# Patient Record
Sex: Female | Born: 1940
Health system: Southern US, Community
[De-identification: ages and names within clinical notes are randomized; demographics above are authoritative.]

## PROBLEM LIST (undated history)

## (undated) DIAGNOSIS — H269 Unspecified cataract: Secondary | ICD-10-CM

## (undated) DIAGNOSIS — C801 Malignant (primary) neoplasm, unspecified: Secondary | ICD-10-CM

## (undated) DIAGNOSIS — R519 Headache, unspecified: Secondary | ICD-10-CM

## (undated) DIAGNOSIS — E119 Type 2 diabetes mellitus without complications: Secondary | ICD-10-CM

## (undated) DIAGNOSIS — M109 Gout, unspecified: Secondary | ICD-10-CM

## (undated) DIAGNOSIS — J358 Other chronic diseases of tonsils and adenoids: Secondary | ICD-10-CM

## (undated) DIAGNOSIS — K219 Gastro-esophageal reflux disease without esophagitis: Secondary | ICD-10-CM

## (undated) DIAGNOSIS — F329 Major depressive disorder, single episode, unspecified: Secondary | ICD-10-CM

## (undated) DIAGNOSIS — F32A Depression, unspecified: Secondary | ICD-10-CM

## (undated) DIAGNOSIS — F419 Anxiety disorder, unspecified: Secondary | ICD-10-CM

## (undated) DIAGNOSIS — Z972 Presence of dental prosthetic device (complete) (partial): Secondary | ICD-10-CM

## (undated) DIAGNOSIS — D649 Anemia, unspecified: Secondary | ICD-10-CM

## (undated) DIAGNOSIS — I639 Cerebral infarction, unspecified: Secondary | ICD-10-CM

## (undated) DIAGNOSIS — R0902 Hypoxemia: Secondary | ICD-10-CM

## (undated) DIAGNOSIS — I10 Essential (primary) hypertension: Secondary | ICD-10-CM

## (undated) DIAGNOSIS — E785 Hyperlipidemia, unspecified: Secondary | ICD-10-CM

## (undated) DIAGNOSIS — R42 Dizziness and giddiness: Secondary | ICD-10-CM

## (undated) DIAGNOSIS — Z973 Presence of spectacles and contact lenses: Secondary | ICD-10-CM

## (undated) HISTORY — PX: CATARACT EXTRACTION: SUR2

## (undated) HISTORY — PX: BREAST SURGERY: SHX581

## (undated) HISTORY — PX: MULTIPLE TOOTH EXTRACTIONS: SHX2053

## (undated) HISTORY — DX: Hyperlipidemia, unspecified: E78.5

## (undated) HISTORY — PX: COLONOSCOPY W/ BIOPSIES AND POLYPECTOMY: SHX1376

## (undated) HISTORY — PX: BUNIONECTOMY: SHX129

## (undated) HISTORY — DX: Depression, unspecified: F32.A

## (undated) HISTORY — DX: Major depressive disorder, single episode, unspecified: F32.9

## (undated) HISTORY — DX: Unspecified cataract: H26.9

## (undated) HISTORY — DX: Anxiety disorder, unspecified: F41.9

## (undated) HISTORY — DX: Gout, unspecified: M10.9

## (undated) HISTORY — DX: Anemia, unspecified: D64.9

## (undated) HISTORY — PX: ABDOMINAL HYSTERECTOMY: SHX81

## (undated) HISTORY — DX: Hypoxemia: R09.02

## (undated) HISTORY — DX: Malignant (primary) neoplasm, unspecified: C80.1

## (undated) HISTORY — PX: UPPER GASTROINTESTINAL ENDOSCOPY: SHX188

## (undated) HISTORY — DX: Cerebral infarction, unspecified: I63.9

## (undated) HISTORY — PX: COLONOSCOPY: SHX174

---

## 2002-09-29 ENCOUNTER — Ambulatory Visit: Admission: RE | Admit: 2002-09-29 | Discharge: 2002-09-29 | Payer: Self-pay | Admitting: Internal Medicine

## 2002-12-08 ENCOUNTER — Emergency Department (HOSPITAL_COMMUNITY): Admission: EM | Admit: 2002-12-08 | Discharge: 2002-12-08 | Payer: Self-pay | Admitting: *Deleted

## 2003-10-24 ENCOUNTER — Ambulatory Visit (HOSPITAL_COMMUNITY): Admission: RE | Admit: 2003-10-24 | Discharge: 2003-10-24 | Payer: Self-pay | Admitting: Family Medicine

## 2004-04-17 ENCOUNTER — Ambulatory Visit: Payer: Self-pay | Admitting: Family Medicine

## 2004-04-18 ENCOUNTER — Ambulatory Visit: Payer: Self-pay | Admitting: *Deleted

## 2004-08-25 ENCOUNTER — Emergency Department (HOSPITAL_COMMUNITY): Admission: EM | Admit: 2004-08-25 | Discharge: 2004-08-25 | Payer: Self-pay | Admitting: Family Medicine

## 2004-11-04 ENCOUNTER — Ambulatory Visit: Payer: Self-pay | Admitting: Internal Medicine

## 2005-02-10 ENCOUNTER — Ambulatory Visit: Payer: Self-pay | Admitting: Family Medicine

## 2005-02-17 ENCOUNTER — Ambulatory Visit: Payer: Self-pay | Admitting: Internal Medicine

## 2005-03-05 ENCOUNTER — Ambulatory Visit (HOSPITAL_COMMUNITY): Admission: RE | Admit: 2005-03-05 | Discharge: 2005-03-05 | Payer: Self-pay | Admitting: Family Medicine

## 2005-03-28 ENCOUNTER — Ambulatory Visit: Payer: Self-pay | Admitting: Family Medicine

## 2005-11-12 ENCOUNTER — Ambulatory Visit: Payer: Self-pay | Admitting: Family Medicine

## 2005-12-10 ENCOUNTER — Ambulatory Visit: Payer: Self-pay | Admitting: Family Medicine

## 2006-05-14 ENCOUNTER — Ambulatory Visit: Payer: Self-pay | Admitting: Family Medicine

## 2006-06-19 ENCOUNTER — Ambulatory Visit (HOSPITAL_COMMUNITY): Admission: RE | Admit: 2006-06-19 | Discharge: 2006-06-19 | Payer: Self-pay | Admitting: Internal Medicine

## 2006-07-14 DIAGNOSIS — I639 Cerebral infarction, unspecified: Secondary | ICD-10-CM

## 2006-07-14 HISTORY — DX: Cerebral infarction, unspecified: I63.9

## 2006-12-17 ENCOUNTER — Ambulatory Visit: Payer: Self-pay | Admitting: Internal Medicine

## 2007-01-30 ENCOUNTER — Emergency Department (HOSPITAL_COMMUNITY): Admission: EM | Admit: 2007-01-30 | Discharge: 2007-01-30 | Payer: Self-pay | Admitting: Family Medicine

## 2007-02-01 ENCOUNTER — Ambulatory Visit: Payer: Self-pay | Admitting: Vascular Surgery

## 2007-02-01 ENCOUNTER — Inpatient Hospital Stay (HOSPITAL_COMMUNITY): Admission: EM | Admit: 2007-02-01 | Discharge: 2007-02-06 | Payer: Self-pay | Admitting: Emergency Medicine

## 2007-02-02 ENCOUNTER — Encounter (INDEPENDENT_AMBULATORY_CARE_PROVIDER_SITE_OTHER): Payer: Self-pay | Admitting: *Deleted

## 2007-02-05 ENCOUNTER — Ambulatory Visit: Payer: Self-pay | Admitting: Physical Medicine & Rehabilitation

## 2007-02-06 ENCOUNTER — Inpatient Hospital Stay (HOSPITAL_COMMUNITY)
Admission: RE | Admit: 2007-02-06 | Discharge: 2007-02-16 | Payer: Self-pay | Admitting: Physical Medicine & Rehabilitation

## 2007-02-17 ENCOUNTER — Encounter
Admission: RE | Admit: 2007-02-17 | Discharge: 2007-05-18 | Payer: Self-pay | Admitting: Physical Medicine & Rehabilitation

## 2007-02-17 ENCOUNTER — Ambulatory Visit: Payer: Self-pay | Admitting: Internal Medicine

## 2007-03-23 ENCOUNTER — Encounter
Admission: RE | Admit: 2007-03-23 | Discharge: 2007-04-15 | Payer: Self-pay | Admitting: Physical Medicine & Rehabilitation

## 2007-03-23 ENCOUNTER — Ambulatory Visit: Payer: Self-pay | Admitting: Physical Medicine & Rehabilitation

## 2007-03-31 ENCOUNTER — Encounter (INDEPENDENT_AMBULATORY_CARE_PROVIDER_SITE_OTHER): Payer: Self-pay | Admitting: *Deleted

## 2007-05-27 ENCOUNTER — Ambulatory Visit: Payer: Self-pay | Admitting: Family Medicine

## 2007-10-15 ENCOUNTER — Encounter (INDEPENDENT_AMBULATORY_CARE_PROVIDER_SITE_OTHER): Payer: Self-pay | Admitting: Nurse Practitioner

## 2007-10-15 ENCOUNTER — Ambulatory Visit: Payer: Self-pay | Admitting: Internal Medicine

## 2007-10-15 LAB — CONVERTED CEMR LAB
ALT: 9 units/L (ref 0–35)
AST: 14 units/L (ref 0–37)
BUN: 24 mg/dL — ABNORMAL HIGH (ref 6–23)
Basophils Absolute: 0 10*3/uL (ref 0.0–0.1)
Basophils Relative: 0 % (ref 0–1)
CK-MB: 0.9 ng/mL (ref 0.3–4.0)
CO2: 23 meq/L (ref 19–32)
Calcium: 9.8 mg/dL (ref 8.4–10.5)
Eosinophils Absolute: 0.1 10*3/uL (ref 0.0–0.7)
HDL: 40 mg/dL (ref >39)
Lymphocytes Relative: 32 % (ref 12–46)
Monocytes Absolute: 1 10*3/uL (ref 0.1–1.0)
Monocytes Relative: 17 % — ABNORMAL HIGH (ref 3–12)
Neutro Abs: 2.8 10*3/uL (ref 1.7–7.7)
Potassium: 3.8 meq/L (ref 3.5–5.3)
Total CHOL/HDL Ratio: 4.5
Total CK: 29 units/L (ref 7–177)
Troponin I: 0.02 ng/mL (ref <0.06)

## 2007-10-19 ENCOUNTER — Ambulatory Visit (HOSPITAL_COMMUNITY): Admission: RE | Admit: 2007-10-19 | Discharge: 2007-10-19 | Payer: Self-pay | Admitting: Nurse Practitioner

## 2007-10-21 ENCOUNTER — Encounter: Payer: Self-pay | Admitting: Internal Medicine

## 2007-10-21 ENCOUNTER — Ambulatory Visit (HOSPITAL_COMMUNITY): Admission: RE | Admit: 2007-10-21 | Discharge: 2007-10-21 | Payer: Self-pay | Admitting: Internal Medicine

## 2009-05-30 ENCOUNTER — Encounter: Admission: RE | Admit: 2009-05-30 | Discharge: 2009-05-30 | Payer: Self-pay | Admitting: Internal Medicine

## 2009-06-11 ENCOUNTER — Encounter: Admission: RE | Admit: 2009-06-11 | Discharge: 2009-06-11 | Payer: Self-pay | Admitting: Internal Medicine

## 2010-08-04 ENCOUNTER — Encounter: Payer: Self-pay | Admitting: Physical Medicine & Rehabilitation

## 2010-08-06 ENCOUNTER — Encounter
Admission: RE | Admit: 2010-08-06 | Discharge: 2010-08-06 | Payer: Self-pay | Source: Home / Self Care | Attending: Internal Medicine | Admitting: Internal Medicine

## 2010-11-26 NOTE — Assessment & Plan Note (Signed)
Tina Waller is back regarding a left pontine stroke. She is doing quite  well. She still has occasional problems with balance. She usually uses  her cane for any of her longer distances. She has occasional pooling and  drooling on the right side but this is improving. She does gag  occasionally on thin liquids. Speech has been working with her on  technique, swallowing smaller amounts, etc. She is independent with all  of her self care and mobility. Her daughter has been supportive. 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  She is sleeping well. Her mood is good. Blood pressure has been under  good control.   REVIEW OF SYSTEMS:  Notable for the above. Full review is in the written  health and history section.   SOCIAL HISTORY:  Patient is divorced and working as above.   PHYSICAL EXAMINATION:  Blood pressure is 137/46, pulse is 55,  respiratory rate 18, she is sating 100% in room air. Patient is  pleasant, alert and oriented x3. Affect is bright and appropriate. Gait  is slightly wide based and she leans a bit to the right but compensates  very nicely. No loss of balance is seen. She has mild decreased spine  motor coordination in right leg and arm today. Strength is 4 + out of 5.  She has a mild right central 7. Speech is clear and volume is good. Eye  movement is intact to all fields. She is able to see with her glasses  fairly well. Does complain of some decreased far vision without her  glasses.  HEART: Regular.  CHEST: Clear.  ABDOMEN: Soft, nontender.  Patient's weight is stable and she is bright and appropriate.   ASSESSMENT/PLAN:  1. Functional deficits secondary to left pontine infarct. Patient is      doing quite well at this point. Still has some residual dysphagia      and balance deficits but is able to compensate.  2. Hypertension.   PLAN:  1. Continue current medications as she is taking. She needs to      establish a primary  care physician. She also needs to obtain      insurance through her work. She does have benefits available but      elected not to use these due to money issues.  2. I gave her permission to drive after 1 or 2 dry runs with her      daughter. I think she should be fine.  3. I cleared her to return to work in October on a part time basis for      2 weeks and then up to full time as tolerated thereafter.  4. I will see her as needed in the future.      Ranelle Oyster, M.D.  Electronically Signed     ZTS/MedQ  D:  03/24/2007 09:26:57  T:  03/24/2007 15:50:22  Job #:  [id number]
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
## 2010-11-26 NOTE — Discharge Summary (Signed)
Tina Waller, [name] NO.:  [id number]   MEDICAL RECORD NO.:  [id number]          PATIENT TYPE:  INP   LOCATION:  1402                         FACILITY:  [facility]   PHYSICIAN:  Elliot Cousin, M.D.    DATE OF BIRTH:  10/31/1940   DATE OF ADMISSION:  02/01/2007  DATE OF DISCHARGE:                               DISCHARGE SUMMARY   ANTICIPATED DATE OF DISCHARGE:  February 06, 2007.   DISCHARGE DIAGNOSES:  1. Acute left pontine stroke with right sided hemiparesis.  2. Cerebrovascular atherosclerotic disease in the distal left      vertebral artery and vertebral basilar junction with focal distal      left vertebral artery flow gap suggesting high-grade stenosis;      moderate focal stenosis of the proximal right anterior cerebral      artery origin; irregularity of the bilateral carotid siphons, right      greater than left, and posterior cerebral arteries compatible with      atherosclerotic disease per MRA of the head on February 02, 2007.  3. Hypertension, malignant.  4. Urinary tract infection.  5. Hypokalemia thought to be secondary to hydrochlorothiazide.  6. Tobacco abuse.   SECONDARY DISCHARGE DIAGNOSES:  1. Anxiety.  2. Status post total hysterectomy secondary to heavy bleeding from      fibroids.  3. Left bunion and hammer toe surgery.   CONSULTATIONS:  1. Ellison Carwin, MD  2. Dr. Riley Kill, MD   PROCEDURES PERFORMED:  1. MRI of the brain on February 02, 2007.  The results revealed acute left      pontine stroke and advanced chronic brain stem and cerebellar small      vessel ischemic disease.  2. MRA of the head.  Results are above.  3. CT scan of the head without contrast on February 01, 2007.  The results      revealed atrophy and chronic small vessel changes.  The appearance      of the pons and the thalami raise some concern.  4. 2D echocardiogram performed on February 02, 2007.  The results revealed      overall left ventricular systolic function was vigorous.   Left      ventricular ejection fraction estimated to be 65-75%.  Left      ventricular wall thickness was mildly to moderately increased.      Doppler parameters consistent with high left ventricular ceiling      pressure.  No likely discrete intracardiac source of emboli was      apparent.  5. Carotid Doppler study performed on February 04, 2007.  The results      revealed no significant ICA stenosis.   HISTORY OF PRESENT ILLNESS:  The patient is a 70 year old woman with a  past medical history significant for hypertension, who presented to the  emergency department on February 01, 2007 with a chief complaint of right-  sided weakness.  When the patient was evaluated in the emergency  department, she was noted to be hypertensive, with a blood pressure of  217/92.  A CT scan of the head was ordered  in the emergency department  and it revealed chronic ischemic changes but no evidence of an acute  infarct.  The patient was therefore admitted for further evaluation and  management for a possible acute stroke.   For additional details, please see the dictated history and physical by  Dr. Michaelyn Barter.   HOSPITAL COURSE:  1. ACUTE LEFT PONTINE STROKE AND CEREBROVASCULAR ATHEROSCLEROTIC      DISEASE.  The patient was started on gentle IV fluids and      prophylactic Protonix and Lovenox.  She was made NPO until further      evaluation.  She was however started on aspirin 325 mg daily, p.o.      For further evaluation, an MRI of the brain, MRA of the head,      carotid Dopplers, and a 2-D echocardiogram were ordered.  The MRI      of the brain revealed an acute left pontine stroke.  The MRA of the      head revealed significant atherosclerotic disease in the left      vertebral artery and vertebral basilar areas, as well as stenotic      areas of the proximal right anterior cerebral artery.  The 2-D      echocardiogram revealed no obvious embolic source of the stroke.      Her ejection  fraction was estimated to be 65-75%.  The carotid      Doppler study did not reveal any significant ICA stenosis.   Neurologist Dr. Sharene Skeans was consulted.  He agreed with aspirin therapy.  He recommended a rehab consult, smoking cessation, and better blood  pressure management.  Per his assessment, no endovascular procedure in  the setting of an acute or subacute infarct was indicated.  He did  however recommend that the patient follow up with Dr. Pearlean Brownie in 2 months  for further evaluation.   The speech therapist, occupational therapist and physical therapist were  all consulted for further evaluation.  The speech therapist recommended  aspiration precautions, however she felt that the patient could tolerate  a regular diet with thin liquids.  The occupational and physical  therapists both recommended a rehab consultation as the patient was felt  to be a good candidate for further strengthening in the rehab unit at  [facility].  Dr. Riley Kill,  was therefore consulted.  Per his assessment on February 05, 2007, the patient certainly would be a good candidate for inpatient  rehab if a bed were available over the next 24 hours.  Otherwise, he  recommended home health physical therapies.   Over the past 24-48 hours, the patient has been stable.  She continues  to ambulate with the therapist in the hallway.  She believes that she is  getting stronger by the day.  She does however have residual right sided  weakness, estimated to be 4/5 in strength.  1. MALIGNANT HYPERTENSION.  The patient was recently started on      treatment with Lopressor and hydrochlorothiazide prior to hospital      admission.  When she arrived to the emergency department on February 01, 2007, her systolic blood pressure was 217.  She was restarted      on hydrochlorothiazide at a higher dose of 25 mg daily.  She was      also restarted on Lopressor 25 mg b.i.d.  Labetalol 10 mg IV every      4 hours was added for a systolic  blood pressure greater than 180.      Over the course of the hospitalization, the Lopressor was been      titrated up to 50 mg b.i.d.  As of today, lisinopril will be added      at 2.5 mg daily.  Certainly, the Lopressor and lisinopril can be      titrated further.  The concern was to not decrease the patient's      blood pressure too quickly in the setting of an acute stroke.  Her      systolic blood pressures over the past 24 hours have been ranging      in the 180s.  2. TOBACCO ABUSE.  The patient was counseled on smoking cessation and      was advised and encouraged by the dictating physician to quit      indefinitely.  3. URINARY TRACT INFECTION.  The patient's urinalysis was positive for      WBCs.  The patient did not complain of dysuria.  Nevertheless,      Cipro was started empirically.  She received 3 days of treatment      with Cipro.  The Cipro was discontinued on February 05, 2007.  The      patient continues to have no symptoms consistent with cystitis.  4. HYPOKALEMIA.  The patient's serum potassium ranged from 3.3 to 3.5      during the hospitalization.  The hypokalemia was thought to be      secondary to hydrochlorothiazide.  The patient is being repleted      daily.   ADDITIONAL LAB DATA:  Ordered for further evaluation, and the results  are as follows:  Free T4 1.03, TSH 1.5, RPR was nonreactive, total  cholesterol 134, triglycerides  57, HDL cholesterol 48, LDL cholesterol  75, homocysteine level 10.2, and troponin I 0.02.      Elliot Cousin, M.D.  Electronically Signed     DF/MEDQ  D:  02/05/2007  T:  02/05/2007  Job:  [id number]   cc:   Deanna Artis. Sharene Skeans, M.D.  Fax: [phone]   Pramod P. Pearlean Brownie, MD  Fax: [phone]

## 2010-11-26 NOTE — Discharge Summary (Signed)
Tina Waller, Tina Waller NO.:  [id number]   MEDICAL RECORD NO.:  [id number]          PATIENT TYPE:  IPS   LOCATION:  4033                         FACILITY:  [facility]   PHYSICIAN:  Ranelle Oyster, M.D.DATE OF BIRTH:  07-03-1941   DATE OF ADMISSION:  02/06/2007  DATE OF DISCHARGE:  02/16/2007                               DISCHARGE SUMMARY   DISCHARGE DIAGNOSES:  1. Left pontine infarction.  2. Hypertension.  3. Tobacco abuse.  4. Subcutaneous Lovenox for deep venous thrombosis prophylaxis.   HISTORY OF PRESENT ILLNESS:  This is a 70 year old, right-handed, black  female admitted to 62 with right-sided weakness.  Blood pressure 215,  diastolic 100.  MRI with left pontine infarction.  MRA with moderate  focal stenosis proximal right cerebral artery.  Echocardiogram with  ejection fraction 65-75% without thrombi.  Carotid Dopplers were  negative for in turn of carotid artery stenosis.  Neurology consulted,  placed on aspirin therapy with a subcutaneous Lovenox for deep venous  thrombosis prophylaxis.  Cardiac enzymes negative.  Monitoring of blood  pressure, placed on Lopressor and hydrochlorothiazide.  Patient  maintained on regular diet.   PAST MEDICAL HISTORY:  See discharge diagnoses.  Noted history of  tobacco abuse, negative alcohol.   ALLERGIES:  NONE.   SOCIAL HISTORY:  He lives alone.  Works at [employer], Office manager.  Local family, works.  Daughter plans family medical leave if  needed.   MEDICATIONS ON ADMISSION:  Were:  1. Hydrochlorothiazide 25 mg 1/2 tablet daily.  2. Lopressor twice daily.  3. Premarin daily.  4. Meclizine as needed.  5. Trazodone at bedtime.   REHABILITATION/HOSPITAL COURSE:  The patient was admitted to inpatient  rehab services with therapies initiated on a 3 hour daily basis,  consisting of physical therapy, occupational therapy, speech therapy and  rehabilitation nursing.  The following issues were  addressed during  patient's rehabilitation stay.  Pertaining to Ms. Garro' left pontine  infarction remains stable, maintained on aspirin therapy.  She was  maintained on a regular diet, of which she tolerated well.  Blood  pressures monitored with Lopressor, increased to 100 mg twice daily, as  well as the addition of Norvasc 10 mg daily.  She would also continue on  hydrochlorothiazide 50 mg daily.  She was placed on a Nicoderm patch  with slow taper.  It was stressed the need for cessation of smoking.  It  was questionable she would be compliant with these requests.  Functionally, she showed progressive gains, ambulating supervision to  minimal assistance.  Supervision for bathing, dressing and grooming.  Overall, her strength and endurance had greatly improved.  She was  encouraged of her overall progress and ultimate plan was to be  discharged to home.   DISCHARGE MEDICATIONS:  At time of dictation, included:  1. Ecotrin 325 mg daily.  2. Hydrochlorothiazide 50 mg daily.  3. Lopressor 100 mg twice daily.  4. Potassium chloride 20 mEq daily.  5. Norvasc 10 mg daily.  6. Lexapro 10 mg 1/2 tablet at bedtime.  7. Trazodone 50 mg at bedtime as needed for  sleep.  8. Nicoderm patch with taper as advised.  9. Multivitamin daily.   DIET:  She was on a regular diet.   FOLLOWUP:  She will follow up with Dr. Riley Kill at the outpatient  rehabilitation service office on March 24, 2007 at 9:20 a.m.  Dr.  Margarette Canada of [place], [phone], on February 17, 2007 at 11 a.m. and Dr.  Sharene Skeans, neurology services.      Mariam Dollar, P.A.      Ranelle Oyster, M.D.  Electronically Signed    DA/MEDQ  D:  02/22/2007  T:  02/22/2007  Job:  [id number]   cc:   [place], M.D.
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
## 2011-04-28 LAB — PROTIME-INR: INR: 1

## 2011-04-28 LAB — COMPREHENSIVE METABOLIC PANEL
ALT: 9
AST: 15
AST: 24
Albumin: 2.7 — ABNORMAL LOW
Alkaline Phosphatase: 76
CO2: 23
Calcium: 9.3
Chloride: 104
Creatinine, Ser: 1.03
GFR calc Af Amer: >60
GFR calc Af Amer: >60
Glucose, Bld: 105 — ABNORMAL HIGH
Potassium: 3.5
Sodium: 136
Total Protein: 6.3
Total Protein: 7.2

## 2011-04-28 LAB — BASIC METABOLIC PANEL
BUN: 7
Calcium: 8.7
Calcium: 8.8
Creatinine, Ser: 1.03
GFR calc Af Amer: >60
GFR calc non Af Amer: 54 — ABNORMAL LOW
GFR calc non Af Amer: >60
GFR calc non Af Amer: >60
Glucose, Bld: 99
Potassium: 3.3 — ABNORMAL LOW
Sodium: 137
Sodium: 138

## 2011-04-28 LAB — CBC
HCT: 34.6 — ABNORMAL LOW
Hemoglobin: 11.5 — ABNORMAL LOW
Hemoglobin: 11.6 — ABNORMAL LOW
MCHC: 34.3
MCV: 96.7
MCV: 97.8
Platelets: 203
Platelets: 240
Platelets: 303
RBC: 3.38 — ABNORMAL LOW
RBC: 3.53 — ABNORMAL LOW
RDW: 11.8
RDW: 11.9
RDW: 12.3
WBC: 3.3 — ABNORMAL LOW

## 2011-04-28 LAB — APTT: aPTT: 38 — ABNORMAL HIGH

## 2011-04-28 LAB — DIFFERENTIAL
Band Neutrophils: 0
Basophils Absolute: 0
Basophils Relative: 0
Basophils Relative: 0
Eosinophils Absolute: 0
Eosinophils Absolute: 0.1
Eosinophils Relative: 1
Eosinophils Relative: 2
Lymphocytes Relative: 24
Lymphocytes Relative: 32
Lymphs Abs: 1.1
Metamyelocytes Relative: 0
Monocytes Absolute: 0.5
Monocytes Absolute: 0.9 — ABNORMAL HIGH
Monocytes Relative: 15 — ABNORMAL HIGH

## 2011-04-28 LAB — LIPID PANEL
Cholesterol: 134
HDL: 48
LDL Cholesterol: 75
Total CHOL/HDL Ratio: 2.8

## 2011-04-28 LAB — HOMOCYSTEINE: Homocysteine: 10.2

## 2011-04-28 LAB — CARDIAC PANEL(CRET KIN+CKTOT+MB+TROPI)
Relative Index: INVALID
Total CK: 57

## 2011-04-28 LAB — T4, FREE: Free T4: 1.03

## 2011-04-28 LAB — URINALYSIS, ROUTINE W REFLEX MICROSCOPIC
Protein, ur: 30 — AB
pH: 6

## 2011-04-28 LAB — RPR: RPR Ser Ql: NONREACTIVE

## 2011-04-28 LAB — CK TOTAL AND CKMB (NOT AT ARMC): Relative Index: INVALID

## 2011-04-28 LAB — URINE MICROSCOPIC-ADD ON

## 2011-12-16 ENCOUNTER — Other Ambulatory Visit (HOSPITAL_COMMUNITY): Payer: Self-pay | Admitting: Internal Medicine

## 2011-12-16 DIAGNOSIS — Z1231 Encounter for screening mammogram for malignant neoplasm of breast: Secondary | ICD-10-CM

## 2011-12-24 ENCOUNTER — Ambulatory Visit (HOSPITAL_COMMUNITY)
Admission: RE | Admit: 2011-12-24 | Discharge: 2011-12-24 | Disposition: A | Discharge status: Home / Self Care | Payer: Medicare Other | Source: Ambulatory Visit | Attending: Internal Medicine | Admitting: Internal Medicine

## 2011-12-24 DIAGNOSIS — Z1231 Encounter for screening mammogram for malignant neoplasm of breast: Secondary | ICD-10-CM | POA: Insufficient documentation

## 2012-12-06 ENCOUNTER — Emergency Department (INDEPENDENT_AMBULATORY_CARE_PROVIDER_SITE_OTHER)
Admission: EM | Admit: 2012-12-06 | Discharge: 2012-12-06 | Disposition: A | Discharge status: Home / Self Care | Payer: Medicare Other | Source: Home / Self Care

## 2012-12-06 ENCOUNTER — Encounter (HOSPITAL_COMMUNITY): Payer: Self-pay

## 2012-12-06 DIAGNOSIS — I739 Peripheral vascular disease, unspecified: Secondary | ICD-10-CM

## 2012-12-06 DIAGNOSIS — R600 Localized edema: Secondary | ICD-10-CM

## 2012-12-06 DIAGNOSIS — M940 Chondrocostal junction syndrome [Tietze]: Secondary | ICD-10-CM

## 2012-12-06 DIAGNOSIS — I10 Essential (primary) hypertension: Secondary | ICD-10-CM

## 2012-12-06 DIAGNOSIS — R609 Edema, unspecified: Secondary | ICD-10-CM

## 2012-12-06 HISTORY — DX: Essential (primary) hypertension: I10

## 2012-12-06 HISTORY — DX: Type 2 diabetes mellitus without complications: E11.9

## 2012-12-06 LAB — POCT I-STAT, CHEM 8
Calcium, Ion: 1.2 mmol/L (ref 1.13–1.30)
Hemoglobin: 13.9 g/dL (ref 12.0–15.0)
Sodium: 141 mEq/L (ref 135–145)
TCO2: 25 mmol/L (ref 0–100)

## 2012-12-06 MED ORDER — METOPROLOL SUCCINATE ER 50 MG PO TB24: 50.0000 mg | ORAL_TABLET | Freq: Every day | ORAL | Status: DC

## 2012-12-06 MED ORDER — LOSARTAN POTASSIUM-HCTZ 50-12.5 MG PO TABS: 1.0000 | ORAL_TABLET | Freq: Every day | ORAL | Status: DC

## 2012-12-06 NOTE — ED Provider Notes (Signed)
History     CSN: [id number]  Arrival date & time 12/06/12  1323   First MD Initiated Contact with Patient 12/06/12 1351      Chief Complaint  Patient presents with  . Medication Refill    (Consider location/radiation/quality/duration/timing/severity/associated sxs/prior treatment) HPI Comments: 72 year old female with a history of hypertension, CVA and peripheral vascular disease presents to the urgent care for evaluation of her blood pressure and chest pain. She is complaining of pain along the right upper sternal border. She describes it as pins dipstick her and is worse with taking a deep breath and certain movements. She denies heaviness, tightness, fullness, pressure. Denies shortness of breath or orthopnea. Denies abdominal pain, change in bowel movements, bleeding, urinary symptoms, GI symptoms or fever. She does complain of edema to the feet and ankles bilaterally. She has a history of similar edema in which she was diagnosed with PVD. She is in no acute distress. She appears comfortable lying upright on the exam table her speech is clear, smooth with normal context and relaxed. Her posturing he is relaxed.   Past Medical History  Diagnosis Date  . Hypertension   . Diabetes mellitus without complication     Past Surgical History  Procedure Laterality Date  . Abdominal hysterectomy      History reviewed. No pertinent family history.  History  Substance Use Topics  . Smoking status: Not on file  . Smokeless tobacco: Not on file  . Alcohol Use: Not on file    OB History   Grav Para Term Preterm Abortions TAB SAB Ect Mult Living                  Review of Systems  Constitutional: Positive for activity change. Negative for fever and fatigue.  HENT: Negative.   Respiratory: Negative.   Cardiovascular: Positive for chest pain and leg swelling. Negative for palpitations.  Gastrointestinal: Negative.   Endocrine: Negative for polydipsia and polyuria.  Genitourinary:  Negative.   Musculoskeletal: Positive for joint swelling.  Skin: Positive for color change.  Psychiatric/Behavioral: Negative.     Allergies  Review of patient's allergies indicates no known allergies.  Home Medications   Current Outpatient Rx  Name  Route  Sig  Dispense  Refill  . allopurinol (ZYLOPRIM) 100 MG tablet   Oral   Take 100 mg by mouth daily.         Marland Kitchen amLODipine (NORVASC) 10 MG tablet   Oral   Take 10 mg by mouth daily.         . hydrochlorothiazide (MICROZIDE) 12.5 MG capsule   Oral   Take 12.5 mg by mouth daily.         . metoprolol tartrate (LOPRESSOR) 25 MG tablet   Oral   Take 25 mg by mouth 2 (two) times daily.         . potassium chloride (K-DUR) 10 MEQ tablet   Oral   Take 10 mEq by mouth 2 (two) times daily.         Marland Kitchen losartan-hydrochlorothiazide (HYZAAR) 50-12.5 MG per tablet   Oral   Take 1 tablet by mouth daily.   30 tablet   0   . metoprolol succinate (TOPROL XL) 50 MG 24 hr tablet   Oral   Take 1 tablet (50 mg total) by mouth daily. Take with or immediately following a meal.   30 tablet   0     BP 179/83  Pulse 106  Temp(Src) 98.1 F (36.7  C) (Oral)  Resp 20  SpO2 95%  Physical Exam  Nursing note and vitals reviewed. Constitutional: She is oriented to person, place, and time. She appears well-developed. No distress.  HENT:  Head: Normocephalic and atraumatic.  Eyes: Conjunctivae and EOM are normal.  Neck: Normal range of motion. Neck supple.  No JVD  Cardiovascular: Regular rhythm and intact distal pulses.   No murmur heard. Mild tachycardia at 10 6. S1 and split S2  Pulmonary/Chest: Effort normal and breath sounds normal. No respiratory distress. She has no wheezes. She has no rales. She exhibits tenderness.  Chest wall tenderness over the right sternal border. His reproduces the pain for which he complains of intermittent, quick pin-like sensations.  Abdominal: Soft. There is no tenderness.  Musculoskeletal:  She exhibits edema.  1+ pitting edema of bilateral feet and lesser to the ankles. No edema of the legs. No calf swelling or discoloration. Minor tenderness to the medial aspects of the calves. Discoloration of the feet consistent with venous stasis changes.  Lymphadenopathy:    She has no cervical adenopathy.  Neurological: She is alert and oriented to person, place, and time. No cranial nerve deficit. She exhibits normal muscle tone.  Skin: Skin is warm and dry. No erythema.  Psychiatric: She has a normal mood and affect.    ED Course  Procedures (including critical care time)  Labs Reviewed  POCT I-STAT, CHEM 8 - Abnormal; Notable for the following:    Creatinine, Ser 1.20 (*)    All other components within normal limits   No results found. EKG: Normal sinus rhythm with left axis deviation. No ischemic changes.   1. HTN (hypertension)   2. PVD (peripheral vascular disease)   3. Bilateral edema of lower extremity   4. Costochondritis       MDM  Patient is discharged in stable condition. She has no shortness of breath or anginal symptoms. The peripheral edema is limited to the feet and ankles. It does not involve other areas of the extremities. No signs of CHF. Her posturing is relaxed and is no distress. I-STAT is within normal limits, creatinine is noted to be 1.2. She will be prescribed Hyzaar 50/12.5 mg to take once a day And Toprol-XL 25 mg one daily. Right lower extremities and wear knee and tension compression stockings as we discussed. Instructions to followup with the no abdominal health care services will be given to her today advised to call tomorrow to set up an appointment. For any worsening new symptoms or problems may return or go to emergency apartment.  Hayden Rasmussen, NP 12/06/12 1506  Hayden Rasmussen, NP 12/06/12 1508  Hayden Rasmussen, NP 12/06/12 1511

## 2012-12-06 NOTE — ED Provider Notes (Signed)
Medical screening examination/treatment/procedure(s) were performed by non-physician practitioner and as supervising physician I was immediately available for consultation/collaboration.  Leslee Home, M.D.  Reuben Likes, MD 12/06/12 2033

## 2012-12-06 NOTE — ED Notes (Signed)
Out of all her medications for past 2 months; was reportedly homeless for a while, and even though her Rx had refills on them, her MD told her pharmacy not to refill them. Has reportedly had pin stabbing type pain in her left chest for a couple of weeks, comes and goes, worse w movement of her arms or deep breathing, and both of her legs are swollen

## 2012-12-16 ENCOUNTER — Telehealth: Payer: Self-pay | Admitting: General Practice

## 2012-12-20 ENCOUNTER — Other Ambulatory Visit (HOSPITAL_COMMUNITY): Payer: Self-pay | Admitting: Internal Medicine

## 2013-01-26 NOTE — Telephone Encounter (Signed)
Pt set up appt

## 2013-02-03 ENCOUNTER — Ambulatory Visit: Payer: Medicare Other

## 2013-02-17 ENCOUNTER — Other Ambulatory Visit (HOSPITAL_COMMUNITY): Payer: Self-pay | Admitting: Nurse Practitioner

## 2013-02-17 DIAGNOSIS — Z1231 Encounter for screening mammogram for malignant neoplasm of breast: Secondary | ICD-10-CM

## 2013-02-18 ENCOUNTER — Ambulatory Visit (HOSPITAL_COMMUNITY): Payer: Medicare Other

## 2013-02-25 ENCOUNTER — Other Ambulatory Visit (HOSPITAL_COMMUNITY): Payer: Self-pay | Admitting: Nurse Practitioner

## 2013-02-25 ENCOUNTER — Ambulatory Visit (HOSPITAL_COMMUNITY)
Admission: RE | Admit: 2013-02-25 | Discharge: 2013-02-25 | Disposition: A | Discharge status: Home / Self Care | Payer: Medicare Other | Source: Ambulatory Visit | Attending: Nurse Practitioner | Admitting: Nurse Practitioner

## 2013-02-25 ENCOUNTER — Ambulatory Visit (HOSPITAL_COMMUNITY): Payer: Medicare Other

## 2013-02-25 DIAGNOSIS — Z1231 Encounter for screening mammogram for malignant neoplasm of breast: Secondary | ICD-10-CM

## 2013-02-25 DIAGNOSIS — Z0000 Encounter for general adult medical examination without abnormal findings: Secondary | ICD-10-CM

## 2013-08-12 ENCOUNTER — Ambulatory Visit: Payer: Medicare Other

## 2014-08-18 DIAGNOSIS — I10 Essential (primary) hypertension: Secondary | ICD-10-CM | POA: Diagnosis not present

## 2014-08-18 DIAGNOSIS — F419 Anxiety disorder, unspecified: Secondary | ICD-10-CM | POA: Diagnosis not present

## 2014-08-18 DIAGNOSIS — Z23 Encounter for immunization: Secondary | ICD-10-CM | POA: Diagnosis not present

## 2014-08-18 DIAGNOSIS — L989 Disorder of the skin and subcutaneous tissue, unspecified: Secondary | ICD-10-CM | POA: Diagnosis not present

## 2014-08-18 DIAGNOSIS — Z789 Other specified health status: Secondary | ICD-10-CM | POA: Diagnosis not present

## 2014-09-13 DIAGNOSIS — F418 Other specified anxiety disorders: Secondary | ICD-10-CM | POA: Diagnosis not present

## 2014-09-13 DIAGNOSIS — I10 Essential (primary) hypertension: Secondary | ICD-10-CM | POA: Diagnosis not present

## 2014-10-16 DIAGNOSIS — I10 Essential (primary) hypertension: Secondary | ICD-10-CM | POA: Diagnosis not present

## 2015-01-17 DIAGNOSIS — R609 Edema, unspecified: Secondary | ICD-10-CM | POA: Diagnosis not present

## 2015-01-17 DIAGNOSIS — E785 Hyperlipidemia, unspecified: Secondary | ICD-10-CM | POA: Diagnosis not present

## 2015-01-17 DIAGNOSIS — G4700 Insomnia, unspecified: Secondary | ICD-10-CM | POA: Diagnosis not present

## 2015-01-17 DIAGNOSIS — G132 Systemic atrophy primarily affecting the central nervous system in myxedema: Secondary | ICD-10-CM | POA: Diagnosis not present

## 2015-01-17 DIAGNOSIS — F329 Major depressive disorder, single episode, unspecified: Secondary | ICD-10-CM | POA: Diagnosis not present

## 2015-01-17 DIAGNOSIS — I10 Essential (primary) hypertension: Secondary | ICD-10-CM | POA: Diagnosis not present

## 2015-03-28 ENCOUNTER — Encounter: Payer: Self-pay | Admitting: Internal Medicine

## 2015-03-28 ENCOUNTER — Ambulatory Visit: Payer: Commercial Managed Care - HMO | Attending: Internal Medicine | Admitting: Internal Medicine

## 2015-03-28 VITALS — BP 144/77 | HR 78 | Temp 97.8°F | Resp 15 | Ht 69.0 in | Wt 146.0 lb

## 2015-03-28 DIAGNOSIS — Z8673 Personal history of transient ischemic attack (TIA), and cerebral infarction without residual deficits: Secondary | ICD-10-CM | POA: Diagnosis not present

## 2015-03-28 DIAGNOSIS — F329 Major depressive disorder, single episode, unspecified: Secondary | ICD-10-CM | POA: Insufficient documentation

## 2015-03-28 DIAGNOSIS — F32A Depression, unspecified: Secondary | ICD-10-CM

## 2015-03-28 DIAGNOSIS — M109 Gout, unspecified: Secondary | ICD-10-CM | POA: Insufficient documentation

## 2015-03-28 DIAGNOSIS — I10 Essential (primary) hypertension: Secondary | ICD-10-CM | POA: Insufficient documentation

## 2015-03-28 DIAGNOSIS — I739 Peripheral vascular disease, unspecified: Secondary | ICD-10-CM | POA: Insufficient documentation

## 2015-03-28 MED ORDER — TRAZODONE HCL 50 MG PO TABS: 50.0000 mg | ORAL_TABLET | Freq: Every day | ORAL | Status: DC

## 2015-03-28 MED ORDER — LOSARTAN POTASSIUM-HCTZ 50-12.5 MG PO TABS: 1.0000 | ORAL_TABLET | Freq: Every day | ORAL | Status: DC

## 2015-03-28 MED ORDER — METOPROLOL SUCCINATE ER 25 MG PO TB24: 25.0000 mg | ORAL_TABLET | Freq: Every day | ORAL | Status: DC

## 2015-03-28 MED ORDER — SERTRALINE HCL 25 MG PO TABS: 25.0000 mg | ORAL_TABLET | Freq: Every day | ORAL | Status: DC

## 2015-03-28 MED ORDER — AMLODIPINE BESYLATE 10 MG PO TABS: 10.0000 mg | ORAL_TABLET | Freq: Every day | ORAL | Status: DC

## 2015-03-28 MED ORDER — ALLOPURINOL 100 MG PO TABS: 100.0000 mg | ORAL_TABLET | Freq: Every day | ORAL | Status: DC

## 2015-03-28 NOTE — Patient Instructions (Signed)
Come back in 6 weeks for re-evaluation of depression  Come back in 1 week fasting for blood work

## 2015-03-28 NOTE — Progress Notes (Signed)
Patient ID: Tina Waller, female   DOB: January 14, 1941, 74 y.o.   MRN: [id number]   Tina Waller, is a 74 y.o. female  WUJ:[id number]  [id number]  DOB - 06/12/1941  CC:  Chief Complaint  Patient presents with  . New Patient (Initial Visit)       HPI: Tina Waller is a 74 y.o. female here today to establish medical care. She has a past medical history of HTN, depression, and gout. She reports that she was previously being seen by [facility] but decided to switch. She states that she was treated for depression in the last 1990's but does not remember which medication she was on. Patient states that she believes her depression is returning. She feels like she has no desire to do anything or communicate with others. She states that her appetite has decreased and she just sits at home all day alone. She denies SI. She denies drug use. She is a former smoker quit date 2008 after CVA. She drinks 1 glass of wine per night or 2 shots of Vodka. Her only surgical history is a hysterectomy 30 years ago.  Patient reports that she has been told in the past that she has PVD but has never been treated or seen by a vascular doctor. She denies claudication but states that she has frequent ankle edema and takes lasix for edema.   No Known Allergies Past Medical History  Diagnosis Date  . Hypertension   . Diabetes mellitus without complication    Current Outpatient Prescriptions on File Prior to Visit  Medication Sig Dispense Refill  . allopurinol (ZYLOPRIM) 100 MG tablet Take 100 mg by mouth daily.    Marland Kitchen losartan-hydrochlorothiazide (HYZAAR) 50-12.5 MG per tablet Take 1 tablet by mouth daily. 30 tablet 0  . metoprolol tartrate (LOPRESSOR) 25 MG tablet Take 25 mg by mouth 2 (two) times daily.    Marland Kitchen amLODipine (NORVASC) 10 MG tablet Take 10 mg by mouth daily.    . hydrochlorothiazide (MICROZIDE) 12.5 MG capsule Take 12.5 mg by mouth daily.    . metoprolol succinate (TOPROL XL) 50 MG  24 hr tablet Take 1 tablet (50 mg total) by mouth daily. Take with or immediately following a meal. (Patient not taking: Reported on 03/28/2015) 30 tablet 0  . potassium chloride (K-DUR) 10 MEQ tablet Take 10 mEq by mouth 2 (two) times daily.     No current facility-administered medications on file prior to visit.   History reviewed. No pertinent family history. Social History   Social History  . Marital Status: Divorced    Spouse Name: N/A  . Number of Children: N/A  . Years of Education: N/A   Occupational History  . Not on file.   Social History Main Topics  . Smoking status: Never Smoker   . Smokeless tobacco: Not on file  . Alcohol Use: 0.0 oz/week    0 Standard drinks or equivalent per week     Comment: wine and vodka  . Drug Use: Not on file  . Sexual Activity: Not on file   Other Topics Concern  . Not on file   Social History Narrative    Review of Systems: Other than what is stated in HPI, all other systems are negative.  Objective:   Filed Vitals:   03/28/15 1517  BP: 144/77  Pulse: 78  Temp: 97.8 F (36.6 C)  Resp: 15    Physical Exam  Constitutional: She is oriented to person, place, and time.  Cardiovascular: Normal rate, regular rhythm and normal heart sounds.  Exam reveals decreased pulses.   Severe BLE discoloration  Pulmonary/Chest: Effort normal and breath sounds normal.  Neurological: She is alert and oriented to person, place, and time.  Skin: Skin is warm and dry.  Psychiatric: She has a normal mood and affect.     Lab Results  Component Value Date   WBC 5.6 10/15/2007   HGB 13.9 12/06/2012   HCT 41.0 12/06/2012   MCV 91.1 10/15/2007   PLT 267 10/15/2007   Lab Results  Component Value Date   CREATININE 1.20* 12/06/2012   BUN 15 12/06/2012   NA 141 12/06/2012   K 4.1 12/06/2012   CL 107 12/06/2012   CO2 23 10/15/2007    No results found for: HGBA1C Lipid Panel     Component Value Date/Time   CHOL 181 10/15/2007 1946    TRIG 97 10/15/2007 1946   HDL 40 10/15/2007 1946   CHOLHDL 4.5 Ratio 10/15/2007 1946   VLDL 19 10/15/2007 1946   LDLCALC 122* 10/15/2007 1946       Assessment and plan:   Tina Waller was seen today for new patient (initial visit).  Diagnoses and all orders for this visit:  History of CVA (cerebrovascular accident) Lifestyle modifications discussed. Low fat diet, exercise, aspirin and statin use discussed.  Essential hypertension -     amLODipine (NORVASC) 10 MG tablet; Take 1 tablet (10 mg total) by mouth daily. -     metoprolol succinate (TOPROL-XL) 25 MG 24 hr tablet; Take 1 tablet (25 mg total) by mouth daily. -     losartan-hydrochlorothiazide (HYZAAR) 50-12.5 MG per tablet; Take 1 tablet by mouth daily. Patient blood pressure is stable and may continue on current medication.  Education on diet, exercise, and modifiable risk factors discussed. Will obtain appropriate labs as needed. Will follow up in 3-6 months.   PVD (peripheral vascular disease) -     Ambulatory referral to Vascular Surgery Unable to palpate pulses in office will send for further evaluation  Depression -     Begin sertraline (ZOLOFT) 25 MG tablet; Take 1 tablet (25 mg total) by mouth daily. -     traZODone (DESYREL) 50 MG tablet; Take 1 tablet (50 mg total) by mouth at bedtime. -     Ambulatory referral to Psychiatry I will begin patient on very low dose zoloft and will follow up with her in 6 weeks on depression if she has not found a psychiatrist  Gout without tophus, unspecified cause, unspecified chronicity, unspecified site -    Refill allopurinol (ZYLOPRIM) 100 MG tablet; Take 1 tablet (100 mg total) by mouth daily.  Return in about 1 week (around 04/04/2015) for Lab Visit and 6 weeks PCP depression .      Lance Bosch, [facility] [phone] 03/28/2015, 3:57 PM

## 2015-03-28 NOTE — Progress Notes (Signed)
Patient here to establish care Patient has no complaints at this time Patient did state she suffers from depression so screening  Was done

## 2015-04-03 ENCOUNTER — Other Ambulatory Visit: Payer: Self-pay

## 2015-04-03 DIAGNOSIS — R0989 Other specified symptoms and signs involving the circulatory and respiratory systems: Secondary | ICD-10-CM

## 2015-04-05 ENCOUNTER — Ambulatory Visit: Payer: Commercial Managed Care - HMO | Attending: Internal Medicine

## 2015-04-05 DIAGNOSIS — M109 Gout, unspecified: Secondary | ICD-10-CM | POA: Diagnosis not present

## 2015-04-05 LAB — URIC ACID: Uric Acid, Serum: 4.5 mg/dL (ref 2.4–7.0)

## 2015-04-19 ENCOUNTER — Telehealth: Payer: Self-pay | Admitting: Clinical

## 2015-04-19 NOTE — Telephone Encounter (Signed)
F/u with patient, her referral to Dr. Darleene Cleaver was denied (they do not take her insurance), she has had little appetite and has difficulty staying asleep. Pt has agreed to have [facility] to call her in the morning to set up an appointment with [facility] while we work on finding another psychiatrist who will take her insurance.

## 2015-04-26 ENCOUNTER — Ambulatory Visit: Payer: Commercial Managed Care - HMO | Attending: Internal Medicine | Admitting: Clinical

## 2015-04-26 DIAGNOSIS — F32A Depression, unspecified: Secondary | ICD-10-CM

## 2015-04-26 DIAGNOSIS — F329 Major depressive disorder, single episode, unspecified: Secondary | ICD-10-CM

## 2015-04-26 NOTE — Progress Notes (Signed)
ASSESSMENT: Pt currently experiencing symptoms of depression. Pt needs to establish [facility] med management with psychiatrist, f/u with PCP and [facility]; would benefit from psychoeducation and supportive counseling regarding coping with symptoms of depression.  Stage of Change: contemplative  PLAN: 1. F/U with behavioral health consultant in one month 2. Psychiatric Medications: Zoloft. 3. Behavioral recommendation(s):   -Answer call about referral to [facility] -Do daily relaxation breathing exercises -Consider having glass of wine with dinner instead of bedtime, have hot decaffeinated tea prior to bed -Consider reading educational material regarding coping with symptoms of anxiety and depression.  SUBJECTIVE: Pt. referred by Chari Manning for symptoms of depression:  Pt. reports the following symptoms/concerns: Pt states that she is easily startled, and that she feels her depression is coming back (from having years ago), she falls asleep, but is unable to stay asleep, and would like to be able to sleep a little longer, and to be able to feel more relaxed. Pt says it feels good to do the relaxation breathing exercises, and that she is excited to practice them, along with trying out hot decaffeinated tea before bedtime. She wants to continue drinking a glass of red wine daily, but will try drinking it with dinner instead of at bedtime, and enjoy a show and her book before bed. Pt says she is thankful to have someone listen to her.  Duration of problem: at least a month Severity: moderate  OBJECTIVE: Orientation & Cognition: Oriented x3. Thought processes normal and appropriate to situation. Mood: appropriate. Affect: appropriate Appearance: appropriate Risk of harm to self or others: no risk of harm to self or others Substance use: none Assessments administered: none today, was high at last visit  Diagnosis: Depression CPT Code:  F32.9 -------------------------------------------- Other(s) present in the room: none  Time spent with patient in exam room: 40 minutes

## 2015-05-14 ENCOUNTER — Ambulatory Visit: Payer: Commercial Managed Care - HMO | Attending: Internal Medicine | Admitting: Internal Medicine

## 2015-05-14 ENCOUNTER — Encounter: Payer: Self-pay | Admitting: Internal Medicine

## 2015-05-14 VITALS — BP 135/76 | HR 89 | Temp 98.0°F | Resp 16 | Ht 69.0 in | Wt 149.0 lb

## 2015-05-14 DIAGNOSIS — I739 Peripheral vascular disease, unspecified: Secondary | ICD-10-CM | POA: Insufficient documentation

## 2015-05-14 DIAGNOSIS — L309 Dermatitis, unspecified: Secondary | ICD-10-CM

## 2015-05-14 DIAGNOSIS — K921 Melena: Secondary | ICD-10-CM

## 2015-05-14 DIAGNOSIS — Z23 Encounter for immunization: Secondary | ICD-10-CM | POA: Diagnosis not present

## 2015-05-14 LAB — CBC WITH DIFFERENTIAL/PLATELET
BASOS PCT: 0 % (ref 0–1)
Basophils Absolute: 0 10*3/uL (ref 0.0–0.1)
EOS ABS: 0.1 10*3/uL (ref 0.0–0.7)
Eosinophils Relative: 2 % (ref 0–5)
HCT: 34 % — ABNORMAL LOW (ref 36.0–46.0)
Hemoglobin: 11.8 g/dL — ABNORMAL LOW (ref 12.0–15.0)
Lymphocytes Relative: 31 % (ref 12–46)
Lymphs Abs: 1.8 10*3/uL (ref 0.7–4.0)
MCH: 34 pg (ref 26.0–34.0)
MCHC: 34.7 g/dL (ref 30.0–36.0)
MCV: 98 fL (ref 78.0–100.0)
MONO ABS: 0.9 10*3/uL (ref 0.1–1.0)
MONOS PCT: 15 % — AB (ref 3–12)
MPV: 8.7 fL (ref 8.6–12.4)
NEUTROS PCT: 52 % (ref 43–77)
Neutro Abs: 3 10*3/uL (ref 1.7–7.7)
PLATELETS: 251 10*3/uL (ref 150–400)
RBC: 3.47 MIL/uL — AB (ref 3.87–5.11)
RDW: 15.5 % (ref 11.5–15.5)
WBC: 5.7 10*3/uL (ref 4.0–10.5)

## 2015-05-14 LAB — COMPLETE METABOLIC PANEL WITH GFR
ALT: 8 U/L (ref 6–29)
AST: 15 U/L (ref 10–35)
Albumin: 4.1 g/dL (ref 3.6–5.1)
Alkaline Phosphatase: 128 U/L (ref 33–130)
BUN: 11 mg/dL (ref 7–25)
CO2: 25 mmol/L (ref 20–31)
Calcium: 9.1 mg/dL (ref 8.6–10.4)
Chloride: 101 mmol/L (ref 98–110)
Creat: 1.12 mg/dL — ABNORMAL HIGH (ref 0.60–0.93)
GFR, EST NON AFRICAN AMERICAN: 49 mL/min — AB (ref >=60)
GFR, Est African American: 57 mL/min — ABNORMAL LOW (ref >=60)
GLUCOSE: 108 mg/dL — AB (ref 65–99)
POTASSIUM: 4.7 mmol/L (ref 3.5–5.3)
SODIUM: 138 mmol/L (ref 135–146)
Total Bilirubin: 0.5 mg/dL (ref 0.2–1.2)
Total Protein: 7 g/dL (ref 6.1–8.1)

## 2015-05-14 MED ORDER — TRIAMCINOLONE ACETONIDE 0.1 % EX CREA: 1.0000 "application " | TOPICAL_CREAM | Freq: Two times a day (BID) | CUTANEOUS | Status: DC

## 2015-05-14 NOTE — Progress Notes (Signed)
   Subjective:    Patient ID: Tina Waller, female    DOB: 02/22/1941, 74 y.o.   MRN: [id number]  HPI Comments: Patient states that she sent off a stool sample to [facility] and received a letter back stating that she has blood in her stools. She is unsure of her last colonoscopy date but does not think it has been 10 years. She has not seen blood in the commode. She reports that she has a appointment with Vascular on November 17th  Rash This is a new problem. The current episode started 1 to 4 weeks ago. The problem has been gradually worsening since onset. The affected locations include the left ankle, right ankle, right arm and left arm. The rash is characterized by itchiness, dryness and burning. She was exposed to nothing. Pertinent negatives include no fever or joint pain. Past treatments include nothing. There is no history of allergies or eczema.      Review of Systems  Constitutional: Negative for fever.  Cardiovascular:       Claudication  Musculoskeletal: Negative for joint pain.  Skin: Positive for rash.  All other systems reviewed and are negative.      Objective:   Physical Exam  Constitutional: She is oriented to person, place, and time.  Cardiovascular: Normal rate, regular rhythm and normal heart sounds.   Unable to palpate DP pulses and has a sluggish cap refill  Pulmonary/Chest: Effort normal and breath sounds normal.  Abdominal: She exhibits no distension. There is no tenderness.  Musculoskeletal: She exhibits edema (2+ edema bilaterally). She exhibits no tenderness.  Neurological: She is alert and oriented to person, place, and time.  Skin: Rash (hyperpigmented lichenfication of bilateral folds of arms and ankles) noted.  Psychiatric: She has a normal mood and affect.       Assessment & Plan:  Tina Waller was seen today for rash.  Diagnoses and all orders for this visit:  Eczema -     triamcinolone cream (KENALOG) 0.1 %; Apply 1 application topically 2  (two) times daily. Explained to take short luke warm showers and moisturize skin as soon as she pat dry the skin. May RTC in 2-3 weeks if no improvement is noticed.  PVD (peripheral vascular disease) with claudication (HCC) -     Lipid panel; Future Keep f/u appt on November 17th. She has significant edema but  I do not suggest compression hose with PVD>   Blood in stool -     CBC with Differential -     COMPLETE METABOLIC PANEL WITH GFR -     Ambulatory referral to Gastroenterology Will refer out.   Need for influenza vaccination -     Flu Vaccine QUAD 36+ mos PF IM (Fluarix & Fluzone Quad PF)  Return if symptoms worsen or fail to improve.   Lance Bosch, NP 05/15/2015 1:30 PM

## 2015-05-14 NOTE — Progress Notes (Signed)
Patient here for follow up Complains of having a rash to her ankles and to her inner elbow area That is extremely itchy Rash has been there for about two weeks

## 2015-05-14 NOTE — Patient Instructions (Addendum)
If you do no see improvement in rash in 2 weeks call me back and we may have to send you to Dermatology  Eczema Eczema, also called atopic dermatitis, is a skin disorder that causes inflammation of the skin. It causes a red rash and dry, scaly skin. The skin becomes very itchy. Eczema is generally worse during the cooler winter months and often improves with the warmth of summer. Eczema usually starts showing signs in infancy. Some children outgrow eczema, but it may last through adulthood.  CAUSES  The exact cause of eczema is not known, but it appears to run in families. People with eczema often have a family history of eczema, allergies, asthma, or hay fever. Eczema is not contagious. Flare-ups of the condition may be caused by:   Contact with something you are sensitive or allergic to.   Stress. SIGNS AND SYMPTOMS  Dry, scaly skin.   Red, itchy rash.   Itchiness. This may occur before the skin rash and may be very intense.  DIAGNOSIS  The diagnosis of eczema is usually made based on symptoms and medical history. TREATMENT  Eczema cannot be cured, but symptoms usually can be controlled with treatment and other strategies. A treatment plan might include:  Controlling the itching and scratching.   Use over-the-counter antihistamines as directed for itching. This is especially useful at night when the itching tends to be worse.   Use over-the-counter steroid creams as directed for itching.   Avoid scratching. Scratching makes the rash and itching worse. It may also result in a skin infection (impetigo) due to a break in the skin caused by scratching.   Keeping the skin well moisturized with creams every day. This will seal in moisture and help prevent dryness. Lotions that contain alcohol and water should be avoided because they can dry the skin.   Limiting exposure to things that you are sensitive or allergic to (allergens).   Recognizing situations that cause stress.    Developing a plan to manage stress.  HOME CARE INSTRUCTIONS   Only take over-the-counter or prescription medicines as directed by your health care provider.   Do not use anything on the skin without checking with your health care provider.   Keep baths or showers short (5 minutes) in warm (not hot) water. Use mild cleansers for bathing. These should be unscented. You may add nonperfumed bath oil to the bath water. It is best to avoid soap and bubble bath.   Immediately after a bath or shower, when the skin is still damp, apply a moisturizing ointment to the entire body. This ointment should be a petroleum ointment. This will seal in moisture and help prevent dryness. The thicker the ointment, the better. These should be unscented.   Keep fingernails cut short. Children with eczema may need to wear soft gloves or mittens at night after applying an ointment.   Dress in clothes made of cotton or cotton blends. Dress lightly, because heat increases itching.   A child with eczema should stay away from anyone with fever blisters or cold sores. The virus that causes fever blisters (herpes simplex) can cause a serious skin infection in children with eczema. SEEK MEDICAL CARE IF:   Your itching interferes with sleep.   Your rash gets worse or is not better within 1 week after starting treatment.   You see pus or soft yellow scabs in the rash area.   You have a fever.   You have a rash flare-up after contact  with someone who has fever blisters.    This information is not intended to replace advice given to you by your health care provider. Make sure you discuss any questions you have with your health care provider.   Document Released: 06/27/2000 Document Revised: 04/20/2013 Document Reviewed: 01/31/2013 Elsevier Interactive Patient Education [employer].

## 2015-05-16 ENCOUNTER — Telehealth: Payer: Self-pay

## 2015-05-16 DIAGNOSIS — I10 Essential (primary) hypertension: Secondary | ICD-10-CM

## 2015-05-16 NOTE — Telephone Encounter (Signed)
Patient called to verify [facility] was [facility] confirmed it was received in the pharmacy

## 2015-05-21 MED ORDER — LOSARTAN POTASSIUM-HCTZ 50-12.5 MG PO TABS: 1.0000 | ORAL_TABLET | Freq: Every day | ORAL | Status: DC

## 2015-05-21 NOTE — Telephone Encounter (Signed)
Pt. Called requesting a med refill on losartan-hydrochlorothiazide (HYZAAR) 50-12.5 MG per tablet. Pt. Has no more medication. Please f/u with pt.

## 2015-05-29 ENCOUNTER — Encounter: Payer: Self-pay | Admitting: Vascular Surgery

## 2015-05-31 ENCOUNTER — Ambulatory Visit (HOSPITAL_BASED_OUTPATIENT_CLINIC_OR_DEPARTMENT_OTHER): Payer: Commercial Managed Care - HMO | Admitting: Internal Medicine

## 2015-05-31 ENCOUNTER — Encounter: Payer: Self-pay | Admitting: Vascular Surgery

## 2015-05-31 ENCOUNTER — Ambulatory Visit (HOSPITAL_COMMUNITY)
Admission: RE | Admit: 2015-05-31 | Discharge: 2015-05-31 | Disposition: A | Discharge status: Home / Self Care | Payer: Commercial Managed Care - HMO | Source: Ambulatory Visit | Attending: Vascular Surgery | Admitting: Vascular Surgery

## 2015-05-31 ENCOUNTER — Encounter: Payer: Self-pay | Admitting: Internal Medicine

## 2015-05-31 ENCOUNTER — Ambulatory Visit (INDEPENDENT_AMBULATORY_CARE_PROVIDER_SITE_OTHER): Payer: Commercial Managed Care - HMO | Admitting: Vascular Surgery

## 2015-05-31 VITALS — BP 193/67 | HR 75 | Ht 69.0 in | Wt 143.0 lb

## 2015-05-31 VITALS — BP 189/83 | HR 94 | Temp 98.0°F | Resp 16 | Ht 69.0 in | Wt 145.0 lb

## 2015-05-31 DIAGNOSIS — IMO0001 Reserved for inherently not codable concepts without codable children: Secondary | ICD-10-CM

## 2015-05-31 DIAGNOSIS — I10 Essential (primary) hypertension: Secondary | ICD-10-CM | POA: Insufficient documentation

## 2015-05-31 DIAGNOSIS — R7989 Other specified abnormal findings of blood chemistry: Secondary | ICD-10-CM | POA: Diagnosis not present

## 2015-05-31 DIAGNOSIS — Z8673 Personal history of transient ischemic attack (TIA), and cerebral infarction without residual deficits: Secondary | ICD-10-CM

## 2015-05-31 DIAGNOSIS — R0989 Other specified symptoms and signs involving the circulatory and respiratory systems: Secondary | ICD-10-CM

## 2015-05-31 DIAGNOSIS — R030 Elevated blood-pressure reading, without diagnosis of hypertension: Secondary | ICD-10-CM

## 2015-05-31 DIAGNOSIS — I739 Peripheral vascular disease, unspecified: Secondary | ICD-10-CM | POA: Diagnosis not present

## 2015-05-31 DIAGNOSIS — E119 Type 2 diabetes mellitus without complications: Secondary | ICD-10-CM | POA: Diagnosis not present

## 2015-05-31 DIAGNOSIS — R938 Abnormal findings on diagnostic imaging of other specified body structures: Secondary | ICD-10-CM | POA: Diagnosis not present

## 2015-05-31 MED ORDER — CLONIDINE HCL 0.1 MG PO TABS
0.1000 mg | ORAL_TABLET | Freq: Once | ORAL | Status: AC
  Administered 2015-05-31: 0.1 mg via ORAL

## 2015-05-31 MED ORDER — ATORVASTATIN CALCIUM 10 MG PO TABS: 10.0000 mg | ORAL_TABLET | Freq: Every day | ORAL | Status: DC

## 2015-05-31 NOTE — Progress Notes (Signed)
Patient here for follow up on her blood pressure Patient states she saw her vascular dr earlier and her blood pressure was elevated And was instructed to see her primary doctor Patient presents in office with elevated blood pressure 0.1mg  catapress given per office protocol

## 2015-05-31 NOTE — Progress Notes (Signed)
Patient ID: Tina Waller, female   DOB: 1940/09/25, 74 y.o.   MRN: [id number]  CC: elevated BP  HPI: Tina Waller is a 74 y.o. female here today for a follow up visit.  Patient has past medical history of HTN, PAD, and CVA. Patient was seen by her Vascular surgeon earlier today and was found to have a elevated BP with a systolic of [id number]. She was at that time complaining of dizziness and blurred vision. Before leaving office her dizziness and blurred vision resolved but her blood pressure remained the same. She states that she took all three of her blood pressure medications this morning around 8 am.    No Known Allergies Past Medical History  Diagnosis Date  . Hypertension   . Diabetes mellitus without complication ([place])   . Stroke ([place])   . Anemia    Current Outpatient Prescriptions on File Prior to Visit  Medication Sig Dispense Refill  . allopurinol (ZYLOPRIM) 100 MG tablet Take 1 tablet (100 mg total) by mouth daily. 30 tablet 3  . amLODipine (NORVASC) 10 MG tablet Take 1 tablet (10 mg total) by mouth daily. 30 tablet 3  . aspirin EC 81 MG tablet Take 81 mg by mouth daily.    Marland Kitchen losartan-hydrochlorothiazide (HYZAAR) 50-12.5 MG tablet Take 1 tablet by mouth daily. 30 tablet 2  . metoprolol succinate (TOPROL-XL) 25 MG 24 hr tablet Take 1 tablet (25 mg total) by mouth daily. 30 tablet 3  . traZODone (DESYREL) 50 MG tablet Take 1 tablet (50 mg total) by mouth at bedtime. 30 tablet 3  . Furosemide (LASIX PO) Take by mouth.    . sertraline (ZOLOFT) 25 MG tablet Take 1 tablet (25 mg total) by mouth daily. (Patient not taking: Reported on 05/31/2015) 30 tablet 2  . triamcinolone cream (KENALOG) 0.1 % Apply 1 application topically 2 (two) times daily. 80 g 1   No current facility-administered medications on file prior to visit.   Family History  Problem Relation Age of Onset  . Heart disease Mother    Social History   Social History  . Marital Status: Divorced    Spouse  Name: N/A  . Number of Children: N/A  . Years of Education: N/A   Occupational History  . Not on file.   Social History Main Topics  . Smoking status: Former Smoker    Quit date: 07/14/2006  . Smokeless tobacco: Not on file  . Alcohol Use: 4.2 oz/week    7 Shots of liquor per week     Comment: wine and vodka  . Drug Use: No  . Sexual Activity: Not on file   Other Topics Concern  . Not on file   Social History Narrative    Review of Systems  Neurological: Positive for headaches.  All other systems reviewed and are negative.  Objective:   Filed Vitals:   05/31/15 1415  BP: 189/83  Pulse: 94  Temp:   Resp:     Physical Exam  Constitutional: She is oriented to person, place, and time.  Cardiovascular: Normal rate, regular rhythm and normal heart sounds.   Pulmonary/Chest: Effort normal and breath sounds normal.  Musculoskeletal: She exhibits edema.  Neurological: She is alert and oriented to person, place, and time.     Lab Results  Component Value Date   WBC 5.7 05/14/2015   HGB 11.8* 05/14/2015   HCT 34.0* 05/14/2015   MCV 98.0 05/14/2015   PLT 251 05/14/2015   Lab Results  Component Value Date   CREATININE 1.12* 05/14/2015   BUN 11 05/14/2015   NA 138 05/14/2015   K 4.7 05/14/2015   CL 101 05/14/2015   CO2 25 05/14/2015    No results found for: HGBA1C Lipid Panel     Component Value Date/Time   CHOL 181 10/15/2007 1946   TRIG 97 10/15/2007 1946   HDL 40 10/15/2007 1946   CHOLHDL 4.5 Ratio 10/15/2007 1946   VLDL 19 10/15/2007 1946   LDLCALC 122* 10/15/2007 1946       Assessment and plan:   Tina Waller was seen today for follow-up.  Diagnoses and all orders for this visit:  Elevated blood pressure -     cloNIDine (CATAPRES) tablet 0.1 mg; Take 1 tablet (0.1 mg total) by mouth once in office  Essential hypertension Blood pressure elevated today in office but has been WNL at other visits. I will let patient stay on current therapy and have  her to come back in one week for a BP check. If still elevated I will make changes at that time.  Abnormal TSH -     TSH; Future -     T4, Free; Future  History of CVA (cerebrovascular accident) -     Lipid panel; Future -     atorvastatin (LIPITOR) 10 MG tablet; Take 1 tablet (10 mg total) by mouth daily. For cholesterol   Return in about 1 week (around 06/07/2015) for Nurse Visit-BP check and lab visit .       Lance Bosch, [facility] [phone] 05/31/2015, 2:26 PM

## 2015-05-31 NOTE — Progress Notes (Signed)
[facility] HISTORY AND PHYSICAL   History of Present Illness:  Patient is a 74 y.o. female who presents for evaluation of ankle swelling. The patient was recently seen by her primary and noted to have nonpalpable pulses in the feet and also complains of occasional ankle swelling. The patient states this is been going on for several years. She denies claudication. She denies rest pain. She denies any nonhealing wounds. She did have a stroke in 2008 which left her for with some speech difficulties but the right side weakness did resolve. She did complain of some blurry vision and dizziness while she was in the office today.  Other medical problems include hypertension, diabetes, prior stroke, anemia all of which are currently stable. She is a former smoker quit in 2008.  Past Medical History  Diagnosis Date  . Hypertension   . Diabetes mellitus without complication ([place])   . Stroke ([place])   . Anemia     Past Surgical History  Procedure Laterality Date  . Abdominal hysterectomy      Social History Social History  Substance Use Topics  . Smoking status: Former Smoker    Quit date: 07/14/2006  . Smokeless tobacco: None  . Alcohol Use: 4.2 oz/week    7 Shots of liquor per week     Comment: wine and vodka    Family History Family History  Problem Relation Age of Onset  . Heart disease Mother     Allergies  No Known Allergies   Current Outpatient Prescriptions  Medication Sig Dispense Refill  . allopurinol (ZYLOPRIM) 100 MG tablet Take 1 tablet (100 mg total) by mouth daily. 30 tablet 3  . amLODipine (NORVASC) 10 MG tablet Take 1 tablet (10 mg total) by mouth daily. 30 tablet 3  . aspirin EC 81 MG tablet Take 81 mg by mouth daily.    . Furosemide (LASIX PO) Take by mouth.    . losartan-hydrochlorothiazide (HYZAAR) 50-12.5 MG tablet Take 1 tablet by mouth daily. 30 tablet 2  . metoprolol succinate (TOPROL-XL) 25 MG 24 hr tablet Take 1 tablet (25 mg  total) by mouth daily. 30 tablet 3  . traZODone (DESYREL) 50 MG tablet Take 1 tablet (50 mg total) by mouth at bedtime. 30 tablet 3  . triamcinolone cream (KENALOG) 0.1 % Apply 1 application topically 2 (two) times daily. 80 g 1  . sertraline (ZOLOFT) 25 MG tablet Take 1 tablet (25 mg total) by mouth daily. (Patient not taking: Reported on 05/31/2015) 30 tablet 2   No current facility-administered medications for this visit.    ROS:   General:  No weight loss, Fever, chills  HEENT: No recent headaches, no nasal bleeding, no visual changes, no sore throat  Neurologic: + dizziness, no blackouts, no seizures. No recent symptoms of stroke or mini- stroke. No recent episodes of slurred speech, or temporary blindness.  Cardiac: No recent episodes of chest pain/pressure, no shortness of breath at rest.  No shortness of breath with exertion.  Denies history of atrial fibrillation or irregular heartbeat  Vascular: No history of rest pain in feet.  No history of claudication.  No history of non-healing ulcer, No history of DVT   Pulmonary: No home oxygen, no productive cough, no hemoptysis,  No asthma or wheezing  Musculoskeletal:  [ ]  Arthritis, [ ]  Low back pain,  [ ]  Joint pain  Hematologic:No history of hypercoagulable state.  No history of easy bleeding.  No history of anemia  Gastrointestinal: No hematochezia or melena,  No gastroesophageal reflux, no trouble swallowing  Urinary: [ ]  chronic Kidney disease, [ ]  on HD - [ ]  MWF or [ ]  TTHS, [ ]  Burning with urination, [ ]  Frequent urination, [ ]  Difficulty urinating;   Skin: No rashes  Psychological: No history of anxiety,  No history of depression   Physical Examination  Filed Vitals:   05/31/15 1037 05/31/15 1040  BP: 193/70 193/67  Pulse: 75   Height: 5\' 9"  (1.753 m)   Weight: 143 lb (64.864 kg)   SpO2: 99%     Body mass index is 21.11 kg/(m^2).  General:  Alert and oriented, no acute distress, somewhat slow  speech HEENT: Normal Neck: No bruit or JVD Pulmonary: Clear to auscultation bilaterally Cardiac: Regular Rate and Rhythm without murmur Abdomen: Soft, non-tender, non-distended, no mass Skin: No rash, no ulcer Extremity Pulses:  2+ radial, brachial, 1-2+ femoral, absent popliteal dorsalis pedis, posterior tibial pulses bilaterally Musculoskeletal: No deformity trace ankle edema  Neurologic: Upper and lower extremity motor 5/5 and symmetric  DATA:  Patient had bilateral ABIs performed today which I reviewed and interpreted. ABI on the right was 0.4 to left was 0.37 monophasic waveforms   ASSESSMENT:  Patient with objective evidence of peripheral arterial disease but asymptomatic. She has no claudication she has no rest pain she has no nonhealing wounds. Although she does have occasional ankle swelling this is usually not related to arterial occlusive disease. Most likely this is dependency related.  Patient was also noted be hypertensive with systolic blood pressure in the 190 to 200s on our exam today. She initially had some blurred vision and dizziness. This resolved after eating a snack. However her blood pressure was still high. We will try to arrange for her to see Chari Manning today for further management of her blood pressure or arrange for the patient to be seen at [facility].  PLAN:  #1 management of blood pressure today #2 follow-up 6 months repeat ABIs or sooner she develops rest pain or nonhealing wounds on her [place], MD [facility] Office: [phone] Pager: [phone]

## 2015-05-31 NOTE — Addendum Note (Signed)
Addended by: Dorthula Rue L on: 05/31/2015 01:05 PM   Modules accepted: Orders

## 2015-06-05 ENCOUNTER — Telehealth: Payer: Self-pay

## 2015-06-05 NOTE — Telephone Encounter (Signed)
-----   Message from Lance Bosch, NP sent at 06/04/2015  5:13 PM EST ----- Slightly anemic. I think the blood loss is coming out in her stools. Has she made her appointment to GI yet?

## 2015-06-05 NOTE — Telephone Encounter (Signed)
Spoke with patient and she is aware of her lab results Also spoke with Alinda Sierras and Howie Ill should be contacting patient to set up an appt

## 2015-06-12 DIAGNOSIS — I10 Essential (primary) hypertension: Secondary | ICD-10-CM | POA: Insufficient documentation

## 2015-06-13 ENCOUNTER — Encounter: Payer: Commercial Managed Care - HMO | Admitting: Pharmacist

## 2015-06-19 ENCOUNTER — Ambulatory Visit: Payer: Commercial Managed Care - HMO

## 2015-06-19 ENCOUNTER — Ambulatory Visit: Payer: Commercial Managed Care - HMO | Attending: Internal Medicine | Admitting: Pharmacist

## 2015-06-19 ENCOUNTER — Encounter: Payer: Self-pay | Admitting: Pharmacist

## 2015-06-19 VITALS — BP 166/92 | HR 98

## 2015-06-19 DIAGNOSIS — M109 Gout, unspecified: Secondary | ICD-10-CM | POA: Diagnosis not present

## 2015-06-19 DIAGNOSIS — I10 Essential (primary) hypertension: Secondary | ICD-10-CM | POA: Insufficient documentation

## 2015-06-19 DIAGNOSIS — Z79899 Other long term (current) drug therapy: Secondary | ICD-10-CM | POA: Insufficient documentation

## 2015-06-19 DIAGNOSIS — F32A Depression, unspecified: Secondary | ICD-10-CM

## 2015-06-19 DIAGNOSIS — R7989 Other specified abnormal findings of blood chemistry: Secondary | ICD-10-CM

## 2015-06-19 DIAGNOSIS — Z8673 Personal history of transient ischemic attack (TIA), and cerebral infarction without residual deficits: Secondary | ICD-10-CM

## 2015-06-19 DIAGNOSIS — F329 Major depressive disorder, single episode, unspecified: Secondary | ICD-10-CM

## 2015-06-19 DIAGNOSIS — R799 Abnormal finding of blood chemistry, unspecified: Secondary | ICD-10-CM | POA: Diagnosis not present

## 2015-06-19 LAB — LIPID PANEL
CHOLESTEROL: 143 mg/dL (ref 125–200)
HDL: 60 mg/dL (ref >=46)
LDL Cholesterol: 56 mg/dL (ref <130)
TRIGLYCERIDES: 133 mg/dL (ref <150)
Total CHOL/HDL Ratio: 2.4 Ratio (ref <=5.0)
VLDL: 27 mg/dL (ref <30)

## 2015-06-19 LAB — TSH: TSH: 3.029 u[IU]/mL (ref 0.350–4.500)

## 2015-06-19 LAB — T4, FREE: Free T4: 1.2 ng/dL (ref 0.80–1.80)

## 2015-06-19 MED ORDER — METOPROLOL SUCCINATE ER 25 MG PO TB24: 25.0000 mg | ORAL_TABLET | Freq: Every day | ORAL | Status: DC

## 2015-06-19 MED ORDER — TRAZODONE HCL 50 MG PO TABS: 50.0000 mg | ORAL_TABLET | Freq: Every day | ORAL | Status: DC

## 2015-06-19 MED ORDER — AMLODIPINE BESYLATE 10 MG PO TABS: 10.0000 mg | ORAL_TABLET | Freq: Every day | ORAL | Status: DC

## 2015-06-19 MED ORDER — LOSARTAN POTASSIUM-HCTZ 100-25 MG PO TABS: 1.0000 | ORAL_TABLET | Freq: Every day | ORAL | Status: DC

## 2015-06-19 MED ORDER — ALLOPURINOL 100 MG PO TABS: 100.0000 mg | ORAL_TABLET | Freq: Every day | ORAL | Status: DC

## 2015-06-19 MED ORDER — SERTRALINE HCL 25 MG PO TABS: 25.0000 mg | ORAL_TABLET | Freq: Every day | ORAL | Status: DC

## 2015-06-19 NOTE — Progress Notes (Signed)
S:    Patient arrives in good spirits.    Presents to the clinic for hypertension evaluation.   Patient reports adherence with medications. She reports that she took her blood pressure medications this morning about 7 AM.   Current BP Medications include:  Amlodipine 10 mg daily, losartan-HCTZ 50-12.5 mg daily, and metoprolol 25 mg daily.    O:   Last 3 Office BP readings: BP Readings from Last 3 Encounters:  05/31/15 189/83  05/31/15 193/67  05/14/15 135/76    BMET    Component Value Date/Time   NA 138 05/14/2015 1521   K 4.7 05/14/2015 1521   CL 101 05/14/2015 1521   CO2 25 05/14/2015 1521   GLUCOSE 108* 05/14/2015 1521   BUN 11 05/14/2015 1521   CREATININE 1.12* 05/14/2015 1521   CREATININE 1.20* 12/06/2012 1434   CALCIUM 9.1 05/14/2015 1521   GFRNONAA 49* 05/14/2015 1521   GFRNONAA 54* 02/08/2007 0745   GFRAA 57* 05/14/2015 1521   GFRAA  02/08/2007 0745    >60        The eGFR has been calculated using the MDRD equation. This calculation has not been validated in all clinical    A/P: History of hypertension currently UNcontrolled on current medications. Continue amlodipine 10 mg daily and metoprolol 25 mg daily. Increase losartan-HCTZ to 100-25 mg daily. Patient will continue to take 50-12.5 mg tablets that she has left until the new prescription comes in the mail - she does not have enough tablets left to double up and I don't want her to miss a day on this medication because she ran out. Counseled on DASH diet. Will need to get BMET at next visit to follow potassium and renal function.   Patient uses [facility] and they require 90 day scripts. Refilled all of her chronic medications with 90 day supplies.   Medication reconciliation completed. Results reviewed and written information provided.  Total time in face-to-face counseling 20 minutes.  F/U Clinic Visit with me in 2 weeks.      

## 2015-06-19 NOTE — Patient Instructions (Addendum)
Thanks for coming to see me!  I have sent all of your prescriptions to [facility] with refills  Start taking losartan-hydrochlorothiazide 100-25 mg for your blood pressure. It will come in the mail soon.  Come back and see me in two weeks for blood pressure check

## 2015-06-19 NOTE — Patient Instructions (Signed)
Thanks for coming to see me!  I have sent all of your prescriptions to [facility] with refills  Start taking losartan-hydrochlorothiazide 100-25 mg for your blood pressure. It will come in the mail soon.  Come back and see me in two weeks for blood pressure check

## 2015-06-21 ENCOUNTER — Telehealth: Payer: Self-pay

## 2015-06-21 NOTE — Telephone Encounter (Signed)
Spoke with patient this am and she is aware of her normal lab results 

## 2015-06-21 NOTE — Telephone Encounter (Signed)
-----   Message from Lance Bosch, NP sent at 06/20/2015  9:17 PM EST ----- Labs are within normal limits

## 2015-07-03 ENCOUNTER — Encounter: Payer: Self-pay | Admitting: Pharmacist

## 2015-07-03 ENCOUNTER — Ambulatory Visit: Payer: Commercial Managed Care - HMO | Attending: Internal Medicine | Admitting: Pharmacist

## 2015-07-03 VITALS — BP 143/76 | HR 83

## 2015-07-03 DIAGNOSIS — Z79899 Other long term (current) drug therapy: Secondary | ICD-10-CM | POA: Insufficient documentation

## 2015-07-03 DIAGNOSIS — I10 Essential (primary) hypertension: Secondary | ICD-10-CM | POA: Insufficient documentation

## 2015-07-03 LAB — BASIC METABOLIC PANEL
BUN: 17 mg/dL (ref 7–25)
CALCIUM: 9.2 mg/dL (ref 8.6–10.4)
CHLORIDE: 103 mmol/L (ref 98–110)
CO2: 20 mmol/L (ref 20–31)
CREATININE: 1.35 mg/dL — AB (ref 0.60–0.93)
Glucose, Bld: 101 mg/dL — ABNORMAL HIGH (ref 65–99)
POTASSIUM: 4.3 mmol/L (ref 3.5–5.3)
SODIUM: 138 mmol/L (ref 135–146)

## 2015-07-03 NOTE — Progress Notes (Signed)
S:    Patient arrives in good spirits.    Presents to the clinic for hypertension evaluation.   Patient reports adherence with medications. She reports that she took her blood pressure medications this morning about 7 AM.   Current BP Medications include:  Amlodipine 10 mg daily, losartan-HCTZ 100-25 mg daily, and metoprolol 25 mg daily.   She reports that she has received all of her medications from [facility] and is not having any issues.   O:   Last 3 Office BP readings: BP Readings from Last 3 Encounters:  07/03/15 143/76  06/19/15 166/92  05/31/15 189/83    BMET    Component Value Date/Time   NA 138 05/14/2015 1521   K 4.7 05/14/2015 1521   CL 101 05/14/2015 1521   CO2 25 05/14/2015 1521   GLUCOSE 108* 05/14/2015 1521   BUN 11 05/14/2015 1521   CREATININE 1.12* 05/14/2015 1521   CREATININE 1.20* 12/06/2012 1434   CALCIUM 9.1 05/14/2015 1521   GFRNONAA 49* 05/14/2015 1521   GFRNONAA 54* 02/08/2007 0745   GFRAA 57* 05/14/2015 1521   GFRAA  02/08/2007 0745    >60        The eGFR has been calculated using the MDRD equation. This calculation has not been validated in all clinical    A/P: History of hypertension currently controlled on current medications (goal <150/90). Continue amlodipine 10 mg daily and metoprolol 25 mg daily and losartan-HCTZ to 100-25 mg daily. Ordered BMET to assess for any changes in potassium or renal function with the dose increase. Patient to follow up with Chari Manning, NP, as directed.   Medication reconciliation completed. Results reviewed and written information provided.  Total time in face-to-face counseling 20 minutes.  F/U Clinic Visit with me as needed.

## 2015-07-03 NOTE — Patient Instructions (Addendum)
Thank you for coming to see me!  Your blood pressure looks much better  Continue taking all of your medications as directed.  Follow up with Chari Manning, NP

## 2015-09-14 ENCOUNTER — Telehealth: Payer: Self-pay

## 2015-09-14 ENCOUNTER — Telehealth: Payer: Self-pay | Admitting: Internal Medicine

## 2015-09-14 NOTE — Telephone Encounter (Signed)
Patient called requesting a referral to see a gastroenterologist. Please follow up.

## 2015-09-14 NOTE — Telephone Encounter (Signed)
Returned patient phone call Patient not available Unable to leave message-no voice mail with this number

## 2015-09-20 ENCOUNTER — Telehealth: Payer: Self-pay

## 2015-09-20 DIAGNOSIS — R32 Unspecified urinary incontinence: Secondary | ICD-10-CM

## 2015-09-20 NOTE — Telephone Encounter (Signed)
Returned patient phone call Patient is having some issues with her bowels Not able to control them Would like referral to GI Referral placed in epic

## 2015-09-20 NOTE — Telephone Encounter (Signed)
Pt. Returned call. Please f/u °

## 2015-10-30 DIAGNOSIS — R197 Diarrhea, unspecified: Secondary | ICD-10-CM | POA: Diagnosis not present

## 2015-10-30 DIAGNOSIS — R195 Other fecal abnormalities: Secondary | ICD-10-CM | POA: Diagnosis not present

## 2015-12-05 ENCOUNTER — Encounter: Payer: Self-pay | Admitting: Vascular Surgery

## 2015-12-11 ENCOUNTER — Encounter: Payer: Self-pay | Admitting: Vascular Surgery

## 2015-12-13 ENCOUNTER — Encounter: Payer: Self-pay | Admitting: Vascular Surgery

## 2015-12-13 ENCOUNTER — Ambulatory Visit (INDEPENDENT_AMBULATORY_CARE_PROVIDER_SITE_OTHER): Payer: Commercial Managed Care - HMO | Admitting: Vascular Surgery

## 2015-12-13 ENCOUNTER — Ambulatory Visit (HOSPITAL_COMMUNITY)
Admission: RE | Admit: 2015-12-13 | Discharge: 2015-12-13 | Disposition: A | Discharge status: Home / Self Care | Payer: Commercial Managed Care - HMO | Source: Ambulatory Visit | Attending: Vascular Surgery | Admitting: Vascular Surgery

## 2015-12-13 VITALS — BP 131/78 | HR 86 | Ht 69.0 in | Wt 143.0 lb

## 2015-12-13 DIAGNOSIS — R938 Abnormal findings on diagnostic imaging of other specified body structures: Secondary | ICD-10-CM | POA: Diagnosis not present

## 2015-12-13 DIAGNOSIS — E119 Type 2 diabetes mellitus without complications: Secondary | ICD-10-CM | POA: Insufficient documentation

## 2015-12-13 DIAGNOSIS — I10 Essential (primary) hypertension: Secondary | ICD-10-CM | POA: Insufficient documentation

## 2015-12-13 DIAGNOSIS — R0989 Other specified symptoms and signs involving the circulatory and respiratory systems: Secondary | ICD-10-CM | POA: Diagnosis present

## 2015-12-13 DIAGNOSIS — I739 Peripheral vascular disease, unspecified: Secondary | ICD-10-CM

## 2015-12-13 NOTE — Progress Notes (Signed)
[facility] [place] HISTORY AND PHYSICAL    History of Present Illness:  Patient is a 75 y.o. female who presents for evaluation of ankle swelling and peripheral arterial disease. She was last seen several months ago.  The patient states this has been going on for several years. She denies claudication. She states her walking distance is primarily limited by shortness of breath which occurs at about a half a block. She denies rest pain. She denies any nonhealing wounds. She did have a stroke in 2008 which left her for with some speech difficulties but the right side weakness did resolve. She did complain of some blurry vision and dizziness while she was in the office today.  Other medical problems include hypertension, diabetes, prior stroke, anemia all of which are currently stable. She is a former smoker quit in 2008.    Past Medical History   Diagnosis  Date   .  Hypertension     .  Diabetes mellitus without complication ([place])     .  Stroke ([place])     .  Anemia         Past Surgical History   Procedure  Laterality  Date   .  Abdominal hysterectomy         Social History Social History   Substance Use Topics   .  Smoking status:  Former Smoker       Quit date:  07/14/2006   .  Smokeless tobacco:  None   .  Alcohol Use:  4.2 oz/week       7 Shots of liquor per week         Comment: wine and vodka     Family History Family History   Problem  Relation  Age of Onset   .  Heart disease  Mother       Allergies  No Known Allergies     Current Outpatient Prescriptions   Medication  Sig  Dispense  Refill   .  allopurinol (ZYLOPRIM) 100 MG tablet  Take 1 tablet (100 mg total) by mouth daily.  30 tablet  3   .  amLODipine (NORVASC) 10 MG tablet  Take 1 tablet (10 mg total) by mouth daily.  30 tablet  3   .  aspirin EC 81 MG tablet  Take 81 mg by mouth daily.       .  Furosemide (LASIX PO)  Take by mouth.       .  losartan-hydrochlorothiazide (HYZAAR) 50-12.5  MG tablet  Take 1 tablet by mouth daily.  30 tablet  2   .  metoprolol succinate (TOPROL-XL) 25 MG 24 hr tablet  Take 1 tablet (25 mg total) by mouth daily.  30 tablet  3   .  traZODone (DESYREL) 50 MG tablet  Take 1 tablet (50 mg total) by mouth at bedtime.  30 tablet  3   .  triamcinolone cream (KENALOG) 0.1 %  Apply 1 application topically 2 (two) times daily.  80 g  1   .  sertraline (ZOLOFT) 25 MG tablet  Take 1 tablet (25 mg total) by mouth daily. (Patient not taking: Reported on 05/31/2015)  30 tablet  2      No current facility-administered medications for this visit.     ROS:    Cardiac: No recent episodes of chest pain/pressure, no shortness of breath at rest.  + shortness of breath with exertion.  Denies history of atrial fibrillation or irregular  heartbeat  Vascular: No history of rest pain in feet.  No history of claudication.  No history of non-healing ulcer, No history of DVT    Pulmonary: No home oxygen, no productive cough, no hemoptysis,  No asthma or wheezing  Physical Examination  Filed Vitals:   12/13/15 1544  BP: 131/78  Pulse: 86  Height: 5\' 9"  (1.753 m)  Weight: 143 lb (64.864 kg)  SpO2: 93%     General:  Alert and oriented, no acute distress, somewhat slow speech HEENT: Normal Neck: No bruit or JVD Pulmonary: Clear to auscultation bilaterally Cardiac: Regular Rate and Rhythm without murmur Skin: No rash, no ulcer Extremity Pulses:  2+ radial, brachial, 1-2+ femoral, absent popliteal dorsalis pedis, posterior tibial pulses bilaterally Musculoskeletal: No deformity trace ankle edema   Neurologic: Upper and lower extremity motor 5/5 and symmetric  DATA:  Patient had bilateral ABIs performed today which I reviewed and interpreted. ABI on the right was 0.5 to left was 0.5 monophasic waveforms   ASSESSMENT:  Patient with objective evidence of peripheral arterial disease but asymptomatic. She has no claudication or rest pain or nonhealing wounds. Although  she does have occasional ankle swelling this is usually not related to arterial occlusive disease. Most likely this is dependency related. Her walking distance is primarily limited by dyspnea on exertion.   PLAN:  Patient will continue to try to walk for 30 minutes daily to improve collateralization. She will follow-up with [place] in 6 months.  Ruta Hinds, MD [facility] [place] Office: [phone] Pager: [phone]

## 2016-04-21 ENCOUNTER — Other Ambulatory Visit (HOSPITAL_BASED_OUTPATIENT_CLINIC_OR_DEPARTMENT_OTHER): Payer: Commercial Managed Care - HMO | Admitting: Internal Medicine

## 2016-04-21 ENCOUNTER — Ambulatory Visit: Payer: Commercial Managed Care - HMO | Attending: Internal Medicine | Admitting: Internal Medicine

## 2016-04-21 ENCOUNTER — Encounter: Payer: Self-pay | Admitting: Internal Medicine

## 2016-04-21 VITALS — BP 118/70 | HR 83 | Temp 98.3°F | Resp 16 | Wt 146.2 lb

## 2016-04-21 DIAGNOSIS — Z7982 Long term (current) use of aspirin: Secondary | ICD-10-CM | POA: Insufficient documentation

## 2016-04-21 DIAGNOSIS — F329 Major depressive disorder, single episode, unspecified: Secondary | ICD-10-CM

## 2016-04-21 DIAGNOSIS — F32A Depression, unspecified: Secondary | ICD-10-CM

## 2016-04-21 DIAGNOSIS — R600 Localized edema: Secondary | ICD-10-CM | POA: Insufficient documentation

## 2016-04-21 DIAGNOSIS — Z87891 Personal history of nicotine dependence: Secondary | ICD-10-CM | POA: Insufficient documentation

## 2016-04-21 DIAGNOSIS — G4700 Insomnia, unspecified: Secondary | ICD-10-CM | POA: Diagnosis not present

## 2016-04-21 DIAGNOSIS — E119 Type 2 diabetes mellitus without complications: Secondary | ICD-10-CM | POA: Diagnosis not present

## 2016-04-21 DIAGNOSIS — K029 Dental caries, unspecified: Secondary | ICD-10-CM | POA: Insufficient documentation

## 2016-04-21 DIAGNOSIS — I10 Essential (primary) hypertension: Secondary | ICD-10-CM | POA: Diagnosis not present

## 2016-04-21 DIAGNOSIS — Z131 Encounter for screening for diabetes mellitus: Secondary | ICD-10-CM

## 2016-04-21 DIAGNOSIS — Z1239 Encounter for other screening for malignant neoplasm of breast: Secondary | ICD-10-CM

## 2016-04-21 DIAGNOSIS — M7989 Other specified soft tissue disorders: Secondary | ICD-10-CM | POA: Diagnosis present

## 2016-04-21 DIAGNOSIS — I739 Peripheral vascular disease, unspecified: Secondary | ICD-10-CM | POA: Diagnosis not present

## 2016-04-21 DIAGNOSIS — Z8249 Family history of ischemic heart disease and other diseases of the circulatory system: Secondary | ICD-10-CM | POA: Diagnosis not present

## 2016-04-21 DIAGNOSIS — Z1231 Encounter for screening mammogram for malignant neoplasm of breast: Secondary | ICD-10-CM

## 2016-04-21 DIAGNOSIS — Z8673 Personal history of transient ischemic attack (TIA), and cerebral infarction without residual deficits: Secondary | ICD-10-CM | POA: Diagnosis not present

## 2016-04-21 DIAGNOSIS — Z23 Encounter for immunization: Secondary | ICD-10-CM | POA: Diagnosis not present

## 2016-04-21 DIAGNOSIS — E2839 Other primary ovarian failure: Secondary | ICD-10-CM

## 2016-04-21 DIAGNOSIS — Z1211 Encounter for screening for malignant neoplasm of colon: Secondary | ICD-10-CM

## 2016-04-21 LAB — CBC WITH DIFFERENTIAL/PLATELET
BASOS PCT: 0 %
Basophils Absolute: 0 cells/uL (ref 0–200)
EOS PCT: 2 %
Eosinophils Absolute: 94 cells/uL (ref 15–500)
HCT: 38.6 % (ref 35.0–45.0)
Hemoglobin: 13.3 g/dL (ref 11.7–15.5)
LYMPHS PCT: 29 %
Lymphs Abs: 1363 cells/uL (ref 850–3900)
MCH: 33.2 pg — ABNORMAL HIGH (ref 27.0–33.0)
MCHC: 34.5 g/dL (ref 32.0–36.0)
MCV: 96.3 fL (ref 80.0–100.0)
MONOS PCT: 13 %
MPV: 9.1 fL (ref 7.5–12.5)
Monocytes Absolute: 611 cells/uL (ref 200–950)
NEUTROS ABS: 2632 {cells}/uL (ref 1500–7800)
Neutrophils Relative %: 56 %
PLATELETS: 268 10*3/uL (ref 140–400)
RBC: 4.01 MIL/uL (ref 3.80–5.10)
RDW: 13.6 % (ref 11.0–15.0)
WBC: 4.7 10*3/uL (ref 3.8–10.8)

## 2016-04-21 LAB — LIPID PANEL
CHOLESTEROL: 196 mg/dL (ref 125–200)
HDL: 59 mg/dL (ref >=46)
LDL Cholesterol: 109 mg/dL (ref <130)
Total CHOL/HDL Ratio: 3.3 Ratio (ref <=5.0)
Triglycerides: 139 mg/dL (ref <150)
VLDL: 28 mg/dL (ref <30)

## 2016-04-21 LAB — BASIC METABOLIC PANEL WITH GFR
BUN: 24 mg/dL (ref 7–25)
CALCIUM: 9.3 mg/dL (ref 8.6–10.4)
CO2: 21 mmol/L (ref 20–31)
CREATININE: 1.35 mg/dL — AB (ref 0.60–0.93)
Chloride: 102 mmol/L (ref 98–110)
GFR, EST AFRICAN AMERICAN: 45 mL/min — AB (ref >=60)
GFR, Est Non African American: 39 mL/min — ABNORMAL LOW (ref >=60)
Glucose, Bld: 100 mg/dL — ABNORMAL HIGH (ref 65–99)
Potassium: 3.9 mmol/L (ref 3.5–5.3)
SODIUM: 136 mmol/L (ref 135–146)

## 2016-04-21 LAB — TSH: TSH: 1.26 m[IU]/L

## 2016-04-21 LAB — POCT GLYCOSYLATED HEMOGLOBIN (HGB A1C): Hemoglobin A1C: 5

## 2016-04-21 MED ORDER — ALLOPURINOL 100 MG PO TABS: 100.0000 mg | ORAL_TABLET | Freq: Every day | ORAL | 3 refills | Status: DC

## 2016-04-21 MED ORDER — LOSARTAN POTASSIUM-HCTZ 100-25 MG PO TABS: 1.0000 | ORAL_TABLET | Freq: Every day | ORAL | 3 refills | Status: DC

## 2016-04-21 MED ORDER — SERTRALINE HCL 50 MG PO TABS: 50.0000 mg | ORAL_TABLET | Freq: Every day | ORAL | 1 refills | Status: DC

## 2016-04-21 MED ORDER — AMLODIPINE BESYLATE 10 MG PO TABS: 10.0000 mg | ORAL_TABLET | Freq: Every day | ORAL | 3 refills | Status: DC

## 2016-04-21 MED ORDER — METOPROLOL SUCCINATE ER 25 MG PO TB24: 25.0000 mg | ORAL_TABLET | Freq: Every day | ORAL | 3 refills | Status: DC

## 2016-04-21 NOTE — Progress Notes (Signed)
Tina Waller, is a 75 y.o. female  [id number]  [id number]  DOB - 08/04/1940  CC:  Chief Complaint  Patient presents with  . Leg Swelling       HPI: Tina Waller is a 75 y.o. female here today to establish medical care, last seen by NP 11/16. Per pt, ran out of her meds recently and need refills., w/ pmhx of htn, pad, prior cva.  C/o of lower feet swelling, worse last 1 month, but has chronic small amt of edema both feet, but this is much more than normal. Denies pain. Denies claudication. States gets sob after walking long distances, but denies cp. Denies orthopnea/pnd.  States she has been feeling run down since this past summer, feels like cannot gain weight. She normally weights about 148lbs, 146lbs currently. She denies smoking, drinks 1 -2  glass etoh (1 per session) on weekends sometimes.  Hx of being on statins in past, but c/o of severe diarrhea, so stopped it. Not currently taking. Still taking asa 81.  Hx of depression and insomnia, takes zoloft 25 qh and trazodone prn for sleep. Has hard time sleeping at times still. Notes no real improvement on her depression, but denies si/hi/avh.  Amendable to trying higher dose zoloft and backing down on trazodone trial.  Patient has No headache, No chest pain, No abdominal pain - No Nausea, No new weakness tingling or numbness, No Cough - SOB.    Review of Systems: Per HPI, o/w all systems reviewed and negative.   No Known Allergies Past Medical History:  Diagnosis Date  . Anemia   . Diabetes mellitus without complication ([place])   . Hypertension   . Stroke [facility])    Current Outpatient Prescriptions on File Prior to Visit  Medication Sig Dispense Refill  . aspirin EC 81 MG tablet Take 81 mg by mouth daily.    Marland Kitchen atorvastatin (LIPITOR) 10 MG tablet Take 1 tablet (10 mg total) by mouth daily. For cholesterol (Patient not taking: Reported on 12/13/2015) 90 tablet 3  . triamcinolone cream (KENALOG) 0.1 % Apply 1  application topically 2 (two) times daily. 80 g 1   No current facility-administered medications on file prior to visit.    Family History  Problem Relation Age of Onset  . Heart disease Mother    Social History   Social History  . Marital status: Divorced    Spouse name: N/A  . Number of children: N/A  . Years of education: N/A   Occupational History  . Not on file.   Social History Main Topics  . Smoking status: Former Smoker    Quit date: 07/14/2006  . Smokeless tobacco: Not on file  . Alcohol use 4.2 oz/week    7 Shots of liquor per week     Comment: wine and vodka  . Drug use: No  . Sexual activity: Not on file   Other Topics Concern  . Not on file   Social History Narrative  . No narrative on file    Objective:   Vitals:   04/21/16 1409  BP: 118/70  Pulse: 83  Resp: 16  Temp: 98.3 F (36.8 C)    Filed Weights   04/21/16 1409  Weight: 146 lb 3.2 oz (66.3 kg)    BP Readings from Last 3 Encounters:  04/21/16 118/70  12/13/15 131/78  07/03/15 (!) 143/76    Physical Exam: Constitutional: Patient appears well-developed and well-nourished. No distress. AAOx3, pleasant. HENT: Normocephalic, atraumatic, External right and left  ear normal. Oropharynx is clear and moist.   bilat tms clear, poor dentition. Eyes: Conjunctivae and EOM are normal. PERRL, no scleral icterus. Neck: Normal ROM. Neck supple. No JVD.  CVS: RRR, S1/S2 +, no murmurs, no gallops, no carotid bruit.  Pulmonary: Effort and breath sounds normal, no stridor, rhonchi, wheezes, rales.  Abdominal: Soft. BS +, no distension, tenderness, rebound or guarding.  Musculoskeletal: Normal range of motion. No edema and no tenderness.  LE:  bilat pedal edema 2+, palpable pulse bilat, no ulcers., nttp. Neg Homan's sign bilat. Neuro: Alert.  muscle tone coordination wnl. No cranial nerve deficit grossly. Skin: Skin is warm and dry. No rash noted. Not diaphoretic. No erythema. No pallor. Psychiatric:  Normal mood and affect. Behavior, judgment, thought content normal.  Lab Results  Component Value Date   WBC 5.7 05/14/2015   HGB 11.8 (L) 05/14/2015   HCT 34.0 (L) 05/14/2015   MCV 98.0 05/14/2015   PLT 251 05/14/2015   Lab Results  Component Value Date   CREATININE 1.35 (H) 07/03/2015   BUN 17 07/03/2015   NA 138 07/03/2015   K 4.3 07/03/2015   CL 103 07/03/2015   CO2 20 07/03/2015    No results found for: HGBA1C Lipid Panel     Component Value Date/Time   CHOL 143 06/19/2015 0915   TRIG 133 06/19/2015 0915   HDL 60 06/19/2015 0915   CHOLHDL 2.4 06/19/2015 0915   VLDL 27 06/19/2015 0915   LDLCALC 56 06/19/2015 0915       Depression screen PHQ 2/9 04/21/2016 03/28/2015  Decreased Interest 2 1  Down, Depressed, Hopeless 2 1  PHQ - 2 Score 4 2  Altered sleeping 3 3  Tired, decreased energy 2 1  Change in appetite 3 3  Feeling bad or failure about yourself  1 2  Trouble concentrating 0 0  Moving slowly or fidgety/restless 0 0  Suicidal thoughts 2 1  PHQ-9 Score 15 12    Assessment and plan:   1. Essential hypertension Controlled, DASH diet discussed, less salt to prevent retention. - BASIC METABOLIC PANEL WITH GFR - CBC with Differential - amLODipine (NORVASC) 10 MG tablet; Take 1 tablet (10 mg total) by mouth daily.  Dispense: 90 tablet; Refill: 3 - metoprolol succinate (TOPROL-XL) 25 MG 24 hr tablet; Take 1 tablet (25 mg total) by mouth daily.  Dispense: 90 tablet; Refill: 3 - Lipid Panel  2. History of CVA (cerebrovascular accident) Asa 81, will chk lipid panel todal. Pt stated could not tol atorvastatin 10 due to severe uncontrolled diarrhea.  3. Diabetes mellitus screening - POCT glycosylated hemoglobin (Hb A1C) 5.0   4. Pedal edema, bilat. - Compression stockings - renewed  Hyzaar, which also has hctz. - Prealbumin - TSH - recd elavate legs at night, info provided on pedal edema  5. Estrogen deficiency - Vitamin D, 25-hydroxy - DG Bone  Density; Future  - never had prior  6. Colon cancer screening - Ambulatory referral to Gastroenterology - per pt, returned guaic cards w/ [employer] last year, told her "blood on it" but no f/u.  7. Breast cancer screening - MM Digital Screening; Future  8. Poor dentition, but cannot afford dentist.  9. Depression and insomnia - has been on same dose zoloft 25 for long time, not much improvement, increase dose to 50 qhs, may help w/ insomnia as well. Wean down trazodone as able, given age, best to avoid if able  10. Immunizations Pneumococcal 23 v today Flu  vac today. tdap next time.  Return in about 3 weeks (around 05/12/2016) for pedal edema/depression .  The patient was given clear instructions to go to ER or return to medical center if symptoms don't improve, worsen or new problems develop. The patient verbalized understanding. The patient was told to call to get lab results if they haven't heard anything in the next week.    This note has been created with Surveyor, quantity. Any transcriptional errors are unintentional.   Maren Reamer, MD, [place], [place]   04/21/2016, 2:41 PM

## 2016-04-21 NOTE — Patient Instructions (Addendum)
Influenza Virus Vaccine injection (Fluarix) What is this medicine? INFLUENZA VIRUS VACCINE (in floo EN zuh VAHY ruhs vak SEEN) helps to reduce the risk of getting influenza also known as the flu. This medicine may be used for other purposes; ask your health care provider or pharmacist if you have questions. What should I tell my health care provider before I take this medicine? They need to know if you have any of these conditions: -bleeding disorder like hemophilia -fever or infection -Guillain-Barre syndrome or other neurological problems -immune system problems -infection with the human immunodeficiency virus (HIV) or AIDS -low blood platelet counts -multiple sclerosis -an unusual or allergic reaction to influenza virus vaccine, eggs, chicken proteins, latex, gentamicin, other medicines, foods, dyes or preservatives -pregnant or trying to get pregnant -breast-feeding How should I use this medicine? This vaccine is for injection into a muscle. It is given by a health care professional. A copy of Vaccine Information Statements will be given before each vaccination. Read this sheet carefully each time. The sheet may change frequently. Talk to your pediatrician regarding the use of this medicine in children. Special care may be needed. Overdosage: If you think you have taken too much of this medicine contact a poison control center or emergency room at once. NOTE: This medicine is only for you. Do not share this medicine with others. What if I miss a dose? This does not apply. What may interact with this medicine? -chemotherapy or radiation therapy -medicines that lower your immune system like etanercept, anakinra, infliximab, and adalimumab -medicines that treat or prevent blood clots like warfarin -phenytoin -steroid medicines like prednisone or cortisone -theophylline -vaccines This list may not describe all possible interactions. Give your health care provider a list of all the  medicines, herbs, non-prescription drugs, or dietary supplements you use. Also tell them if you smoke, drink alcohol, or use illegal drugs. Some items may interact with your medicine. What should I watch for while using this medicine? Report any side effects that do not go away within 3 days to your doctor or health care professional. Call your health care provider if any unusual symptoms occur within 6 weeks of receiving this vaccine. You may still catch the flu, but the illness is not usually as bad. You cannot get the flu from the vaccine. The vaccine will not protect against colds or other illnesses that may cause fever. The vaccine is needed every year. What side effects may I notice from receiving this medicine? Side effects that you should report to your doctor or health care professional as soon as possible: -allergic reactions like skin rash, itching or hives, swelling of the face, lips, or tongue Side effects that usually do not require medical attention (report to your doctor or health care professional if they continue or are bothersome): -fever -headache -muscle aches and pains -pain, tenderness, redness, or swelling at site where injected -weak or tired This list may not describe all possible side effects. Call your doctor for medical advice about side effects. You may report side effects to FDA at 1-800-FDA-1088. Where should I keep my medicine? This vaccine is only given in a clinic, pharmacy, doctor's office, or other health care setting and will not be stored at home. NOTE: This sheet is a summary. It may not cover all possible information. If you have questions about this medicine, talk to your doctor, pharmacist, or health care provider.    2016, Elsevier/Gold Standard. (2008-01-26 09:30:40)   - Peripheral Edema You have swelling  in your legs (peripheral edema). This swelling is due to excess accumulation of salt and water in your body. Edema may be a sign of heart, kidney  or liver disease, or a side effect of a medication. It may also be due to problems in the leg veins. Elevating your legs and using special support stockings may be very helpful, if the cause of the swelling is due to poor venous circulation. Avoid long periods of standing, whatever the cause. Treatment of edema depends on identifying the cause. Chips, pretzels, pickles and other salty foods should be avoided. Restricting salt in your diet is almost always needed. Water pills (diuretics) are often used to remove the excess salt and water from your body via urine. These medicines prevent the kidney from reabsorbing sodium. This increases urine flow. Diuretic treatment may also result in lowering of potassium levels in your body. Potassium supplements may be needed if you have to use diuretics daily. Daily weights can help you keep track of your progress in clearing your edema. You should call your caregiver for follow up care as recommended. SEEK IMMEDIATE MEDICAL CARE IF:   You have increased swelling, pain, redness, or heat in your legs.  You develop shortness of breath, especially when lying down.  You develop chest or abdominal pain, weakness, or fainting.  You have a fever.   This information is not intended to replace advice given to you by your health care provider. Make sure you discuss any questions you have with your health care provider.   Document Released: 08/07/2004 Document Revised: 09/22/2011 Document Reviewed: 01/10/2015 Elsevier Interactive Patient Education 2016 Elsevier Inc.  -  Edema Edema is an abnormal buildup of fluids. It is more common in your legs and thighs. Painless swelling of the feet and ankles is more likely as a person ages. It also is common in looser skin, like around your eyes. HOME CARE   Keep the affected body part above the level of the heart while lying down.  Do not sit still or stand for a long time.  Do not put anything right under your knees  when you lie down.  Do not wear tight clothes on your upper legs.  Exercise your legs to help the puffiness (swelling) go down.  Wear elastic bandages or support stockings as told by your doctor.  A low-salt diet may help lessen the puffiness.  Only take medicine as told by your doctor. GET HELP IF:  Treatment is not working.  You have heart, liver, or kidney disease and notice that your skin looks puffy or shiny.  You have puffiness in your legs that does not get better when you raise your legs.  You have sudden weight gain for no reason. GET HELP RIGHT AWAY IF:   You have shortness of breath or chest pain.  You cannot breathe when you lie down.  You have pain, redness, or warmth in the areas that are puffy.  You have heart, liver, or kidney disease and get edema all of a sudden.  You have a fever and your symptoms get worse all of a sudden. MAKE SURE YOU:   Understand these instructions.  Will watch your condition.  Will get help right away if you are not doing well or get worse.   This information is not intended to replace advice given to you by your health care provider. Make sure you discuss any questions you have with your health care provider.   Document Released: 12/17/2007 Document  Revised: 07/05/2013 Document Reviewed: 04/22/2013 Elsevier Interactive Patient Education 2016 [place] Eating Plan DASH stands for "Dietary Approaches to Stop Hypertension." The DASH eating plan is a healthy eating plan that has been shown to reduce high blood pressure (hypertension). Additional health benefits may include reducing the risk of type 2 diabetes mellitus, heart disease, and stroke. The DASH eating plan may also help with weight loss. WHAT DO I NEED TO KNOW ABOUT THE DASH EATING PLAN? For the DASH eating plan, you will follow these general guidelines:  Choose foods with a percent daily value for sodium of less than 5% (as listed on the food  label).  Use salt-free seasonings or herbs instead of table salt or sea salt.  Check with your health care provider or pharmacist before using salt substitutes.  Eat lower-sodium products, often labeled as "lower sodium" or "no salt added."  Eat fresh foods.  Eat more vegetables, fruits, and low-fat dairy products.  Choose whole grains. Look for the word "whole" as the first word in the ingredient list.  Choose fish and skinless chicken or [place] more often than red meat. Limit fish, poultry, and meat to 6 oz (170 g) each day.  Limit sweets, desserts, sugars, and sugary drinks.  Choose heart-healthy fats.  Limit cheese to 1 oz (28 g) per day.  Eat more home-cooked food and less restaurant, buffet, and fast food.  Limit fried foods.  Cook foods using methods other than frying.  Limit canned vegetables. If you do use them, rinse them well to decrease the sodium.  When eating at a restaurant, ask that your food be prepared with less salt, or no salt if possible. WHAT FOODS CAN I EAT? Seek help from a dietitian for individual calorie needs. Grains Whole grain or whole wheat bread. Brown rice. Whole grain or whole wheat pasta. Quinoa, bulgur, and whole grain cereals. Low-sodium cereals. Corn or whole wheat flour tortillas. Whole grain cornbread. Whole grain crackers. Low-sodium crackers. Vegetables Fresh or frozen vegetables (raw, steamed, roasted, or grilled). Low-sodium or reduced-sodium tomato and vegetable juices. Low-sodium or reduced-sodium tomato sauce and paste. Low-sodium or reduced-sodium canned vegetables.  Fruits All fresh, canned (in natural juice), or frozen fruits. Meat and Other Protein Products Ground beef (85% or leaner), grass-fed beef, or beef trimmed of fat. Skinless chicken or [place]. Ground chicken or [place]. Pork trimmed of fat. All fish and seafood. Eggs. Dried beans, peas, or lentils. Unsalted nuts and seeds. Unsalted canned beans. Dairy Low-fat dairy  products, such as skim or 1% milk, 2% or reduced-fat cheeses, low-fat ricotta or cottage cheese, or plain low-fat yogurt. Low-sodium or reduced-sodium cheeses. Fats and Oils Tub margarines without trans fats. Light or reduced-fat mayonnaise and salad dressings (reduced sodium). Avocado. Safflower, olive, or canola oils. Natural peanut or almond butter. Other Unsalted popcorn and pretzels. The items listed above may not be a complete list of recommended foods or beverages. Contact your dietitian for more options. WHAT FOODS ARE NOT RECOMMENDED? Grains White bread. White pasta. White rice. Refined cornbread. Bagels and croissants. Crackers that contain trans fat. Vegetables Creamed or fried vegetables. Vegetables in a cheese sauce. Regular canned vegetables. Regular canned tomato sauce and paste. Regular tomato and vegetable juices. Fruits Dried fruits. Canned fruit in light or heavy syrup. Fruit juice. Meat and Other Protein Products Fatty cuts of meat. Ribs, chicken wings, bacon, sausage, bologna, salami, chitterlings, fatback, hot dogs, bratwurst, and packaged luncheon meats. Salted nuts and seeds. Canned beans with  salt. Dairy Whole or 2% milk, cream, half-and-half, and cream cheese. Whole-fat or sweetened yogurt. Full-fat cheeses or blue cheese. Nondairy creamers and whipped toppings. Processed cheese, cheese spreads, or cheese curds. Condiments Onion and garlic salt, seasoned salt, table salt, and sea salt. Canned and packaged gravies. Worcestershire sauce. Tartar sauce. Barbecue sauce. Teriyaki sauce. Soy sauce, including reduced sodium. Steak sauce. Fish sauce. Oyster sauce. Cocktail sauce. Horseradish. Ketchup and mustard. Meat flavorings and tenderizers. Bouillon cubes. Hot sauce. Tabasco sauce. Marinades. Taco seasonings. Relishes. Fats and Oils Butter, stick margarine, lard, shortening, ghee, and bacon fat. Coconut, palm kernel, or palm oils. Regular salad dressings. Other Pickles and  olives. Salted popcorn and pretzels. The items listed above may not be a complete list of foods and beverages to avoid. Contact your dietitian for more information. WHERE CAN I FIND MORE INFORMATION? National Heart, Lung, and Blood Institute: travelstabloid.com   This information is not intended to replace advice given to you by your health care provider. Make sure you discuss any questions you have with your health care provider.   Document Released: 06/19/2011 Document Revised: 07/21/2014 Document Reviewed: 05/04/2013 Elsevier Interactive Patient Education 2016 Elsevier Inc.  Pneumococcal Polysaccharide Vaccine: What You Need to Know 1. Why get vaccinated? Vaccination can protect older adults (and some children and younger adults) from pneumococcal disease. Pneumococcal disease is caused by bacteria that can spread from person to person through close contact. It can cause ear infections, and it can also lead to more serious infections of the:   Lungs (pneumonia),  Blood (bacteremia), and  Covering of the brain and spinal cord (meningitis). Meningitis can cause deafness and brain damage, and it can be fatal. Anyone can get pneumococcal disease, but children under 79 years of age, people with certain medical conditions, adults over 69 years of age, and cigarette smokers are at the highest risk. About 18,000 older adults die each year from pneumococcal disease in the [place]. Treatment of pneumococcal infections with penicillin and other drugs used to be more effective. But some strains of the disease have become resistant to these drugs. This makes prevention of the disease, through vaccination, even more important. 2. Pneumococcal polysaccharide vaccine (PPSV23) Pneumococcal polysaccharide vaccine (PPSV23) protects against 23 types of pneumococcal bacteria. It will not prevent all pneumococcal disease. PPSV23 is recommended for:  All adults 37 years  of age and older,  Anyone 2 through 73 years of age with certain long-term health problems,  Anyone 2 through 73 years of age with a weakened immune system,  Adults 68 through 73 years of age who smoke cigarettes or have asthma. Most people need only one dose of PPSV. A second dose is recommended for certain high-risk groups. People 46 and older should get a dose even if they have gotten one or more doses of the vaccine before they turned 65. Your healthcare provider can give you more information about these recommendations. Most healthy adults develop protection within 2 to 3 weeks of getting the shot. 3. Some people should not get this vaccine  Anyone who has had a life-threatening allergic reaction to PPSV should not get another dose.  Anyone who has a severe allergy to any component of PPSV should not receive it. Tell your provider if you have any severe allergies.  Anyone who is moderately or severely ill when the shot is scheduled may be asked to wait until they recover before getting the vaccine. Someone with a mild illness can usually be vaccinated.  Children less  than 109 years of age should not receive this vaccine.  There is no evidence that PPSV is harmful to either a pregnant woman or to her fetus. However, as a precaution, women who need the vaccine should be vaccinated before becoming pregnant, if possible. 4. Risks of a vaccine reaction With any medicine, including vaccines, there is a chance of side effects. These are usually mild and go away on their own, but serious reactions are also possible. About half of people who get PPSV have mild side effects, such as redness or pain where the shot is given, which go away within about two days. Less than 1 out of 100 people develop a fever, muscle aches, or more severe local reactions. Problems that could happen after any vaccine:  People sometimes faint after a medical procedure, including vaccination. Sitting or lying down for  about 15 minutes can help prevent fainting, and injuries caused by a fall. Tell your doctor if you feel dizzy, or have vision changes or ringing in the ears.  Some people get severe pain in the shoulder and have difficulty moving the arm where a shot was given. This happens very rarely.  Any medication can cause a severe allergic reaction. Such reactions from a vaccine are very rare, estimated at about 1 in a million doses, and would happen within a few minutes to a few hours after the vaccination. As with any medicine, there is a very remote chance of a vaccine causing a serious injury or death. The safety of vaccines is always being monitored. For more information, visit: http://www.aguilar.org/ 5. What if there is a serious reaction? What should I look for? Look for anything that concerns you, such as signs of a severe allergic reaction, very high fever, or unusual behavior.  Signs of a severe allergic reaction can include hives, swelling of the face and throat, difficulty breathing, a fast heartbeat, dizziness, and weakness. These would usually start a few minutes to a few hours after the vaccination. What should I do? If you think it is a severe allergic reaction or other emergency that can't wait, call 9-1-1 or get to the nearest hospital. Otherwise, call your doctor. Afterward, the reaction should be reported to the Vaccine Adverse Event Reporting System (VAERS). Your doctor might file this report, or you can do it yourself through the VAERS web site at www.vaers.SamedayNews.es, or by calling (972)063-4860.  VAERS does not give medical advice. 6. How can I learn more?  Ask your doctor. He or she can give you the vaccine package insert or suggest other sources of information.  Call your local or state health department.  Contact the Centers for Disease Control and Prevention (CDC):  Call 719 213 4060 (1-800-CDC-INFO) or  Visit CDC's website at http://hunter.com/ CDC Pneumococcal  Polysaccharide Vaccine VIS (11/04/13)   This information is not intended to replace advice given to you by your health care provider. Make sure you discuss any questions you have with your health care provider.   Document Released: 04/27/2006 Document Revised: 07/21/2014 Document Reviewed: 11/07/2013 Elsevier Interactive Patient Education [employer].

## 2016-04-21 NOTE — Progress Notes (Signed)
Pt is in the office today for leg swelling and lack of appetite Pt states she is not in any pain today Pt states she is having b/l ankle and feet swelling Pt states her ankles has been going on for 3 weeks Pt states the swelling progress during the day but in the morning their flat

## 2016-04-22 LAB — VITAMIN D 25 HYDROXY (VIT D DEFICIENCY, FRACTURES): VIT D 25 HYDROXY: 13 ng/mL — AB (ref 30–100)

## 2016-04-23 ENCOUNTER — Other Ambulatory Visit: Payer: Self-pay | Admitting: Internal Medicine

## 2016-04-23 LAB — PREALBUMIN: Prealbumin: 30 mg/dL (ref 17–34)

## 2016-04-23 MED ORDER — VITAMIN D (ERGOCALCIFEROL) 1.25 MG (50000 UNIT) PO CAPS: 50000.0000 [IU] | ORAL_CAPSULE | ORAL | 0 refills | Status: DC

## 2016-04-24 ENCOUNTER — Telehealth: Payer: Self-pay

## 2016-04-24 NOTE — Telephone Encounter (Signed)
Contacted pt to go over lab results/ Pt is aware of results and the vit D rx sent to the pharmacy. Pt is also aware to take the vit D after the bone scan. Pt doesn't have any questions or concerns

## 2016-04-25 ENCOUNTER — Ambulatory Visit
Admission: RE | Admit: 2016-04-25 | Discharge: 2016-04-25 | Disposition: A | Discharge status: Home / Self Care | Payer: Commercial Managed Care - HMO | Source: Ambulatory Visit | Attending: Internal Medicine | Admitting: Internal Medicine

## 2016-04-25 DIAGNOSIS — Z1231 Encounter for screening mammogram for malignant neoplasm of breast: Secondary | ICD-10-CM

## 2016-04-25 DIAGNOSIS — Z780 Asymptomatic menopausal state: Secondary | ICD-10-CM | POA: Diagnosis not present

## 2016-04-25 DIAGNOSIS — M810 Age-related osteoporosis without current pathological fracture: Secondary | ICD-10-CM | POA: Diagnosis not present

## 2016-04-25 DIAGNOSIS — E2839 Other primary ovarian failure: Secondary | ICD-10-CM

## 2016-05-07 ENCOUNTER — Telehealth: Payer: Self-pay

## 2016-05-07 NOTE — Telephone Encounter (Signed)
Contacted pt to go over results pt is aware results and doesn't have any question or concerns

## 2016-05-12 ENCOUNTER — Encounter: Payer: Self-pay | Admitting: Internal Medicine

## 2016-05-12 ENCOUNTER — Ambulatory Visit: Payer: Commercial Managed Care - HMO | Attending: Internal Medicine | Admitting: Internal Medicine

## 2016-05-12 VITALS — BP 124/68 | HR 76 | Temp 97.4°F | Resp 16 | Ht 68.0 in | Wt 146.0 lb

## 2016-05-12 DIAGNOSIS — E119 Type 2 diabetes mellitus without complications: Secondary | ICD-10-CM | POA: Insufficient documentation

## 2016-05-12 DIAGNOSIS — Z23 Encounter for immunization: Secondary | ICD-10-CM

## 2016-05-12 DIAGNOSIS — R600 Localized edema: Secondary | ICD-10-CM | POA: Diagnosis not present

## 2016-05-12 DIAGNOSIS — Z8673 Personal history of transient ischemic attack (TIA), and cerebral infarction without residual deficits: Secondary | ICD-10-CM | POA: Diagnosis not present

## 2016-05-12 DIAGNOSIS — I10 Essential (primary) hypertension: Secondary | ICD-10-CM | POA: Diagnosis not present

## 2016-05-12 DIAGNOSIS — Z1211 Encounter for screening for malignant neoplasm of colon: Secondary | ICD-10-CM

## 2016-05-12 DIAGNOSIS — E2839 Other primary ovarian failure: Secondary | ICD-10-CM | POA: Insufficient documentation

## 2016-05-12 DIAGNOSIS — E559 Vitamin D deficiency, unspecified: Secondary | ICD-10-CM | POA: Insufficient documentation

## 2016-05-12 DIAGNOSIS — Z7982 Long term (current) use of aspirin: Secondary | ICD-10-CM | POA: Diagnosis not present

## 2016-05-12 DIAGNOSIS — M810 Age-related osteoporosis without current pathological fracture: Secondary | ICD-10-CM

## 2016-05-12 DIAGNOSIS — Z79899 Other long term (current) drug therapy: Secondary | ICD-10-CM | POA: Diagnosis not present

## 2016-05-12 DIAGNOSIS — R634 Abnormal weight loss: Secondary | ICD-10-CM | POA: Insufficient documentation

## 2016-05-12 MED ORDER — SERTRALINE HCL 50 MG PO TABS: 50.0000 mg | ORAL_TABLET | Freq: Every day | ORAL | 2 refills | Status: DC

## 2016-05-12 NOTE — Progress Notes (Signed)
Addendum to 10/9 visit Dx vit d def chking prealbumin due to inability to gain weight, with unintentional weight loss.

## 2016-05-12 NOTE — Progress Notes (Signed)
Tina Waller, is a 75 y.o. female  [id number]  [id number]  DOB - 1940/09/13  Chief Complaint  Patient presents with  . Foot Swelling        Subjective:   Tina Waller is a 75 y.o. female here today for a follow up visit, last seen 04/21/16. Pt states she is doing better, her swelling in legs /feet have improved since started medications and eating less salt. She has not filled the rx for ted hose yet though.  She also notes her depression and insomnia are better on zoloft 50 qhs. Not taking Trazodone anymore.   Patient has No headache, No chest pain, No abdominal pain - No Nausea, No new weakness tingling or numbness, No Cough - SOB.  Problem  Estrogen Deficiency  Vitamin D Deficiency  Weight Loss    ALLERGIES: No Known Allergies  PAST MEDICAL HISTORY: Past Medical History:  Diagnosis Date  . Anemia   . Diabetes mellitus without complication ([place])   . Hypertension   . Stroke [facility])     MEDICATIONS AT HOME: Prior to Admission medications   Medication Sig Start Date End Date Taking? Authorizing Provider  allopurinol (ZYLOPRIM) 100 MG tablet Take 1 tablet (100 mg total) by mouth daily. 04/21/16  Yes Maren Reamer, MD  amLODipine (NORVASC) 10 MG tablet Take 1 tablet (10 mg total) by mouth daily. 04/21/16  Yes Maren Reamer, MD  aspirin EC 81 MG tablet Take 81 mg by mouth daily.   Yes Historical Provider, MD  losartan-hydrochlorothiazide (HYZAAR) 100-25 MG tablet Take 1 tablet by mouth daily. 04/21/16  Yes Maren Reamer, MD  metoprolol succinate (TOPROL-XL) 25 MG 24 hr tablet Take 1 tablet (25 mg total) by mouth daily. 04/21/16  Yes Maren Reamer, MD  sertraline (ZOLOFT) 50 MG tablet Take 1 tablet (50 mg total) by mouth at bedtime. 05/12/16  Yes Maren Reamer, MD  Vitamin D, Ergocalciferol, (DRISDOL) 50000 units CAPS capsule Take 1 capsule (50,000 Units total) by mouth every 7 (seven) days. 04/23/16  Yes Maren Reamer, MD  atorvastatin  (LIPITOR) 10 MG tablet Take 1 tablet (10 mg total) by mouth daily. For cholesterol Patient not taking: Reported on 12/13/2015 05/31/15   Lance Bosch, NP  triamcinolone cream (KENALOG) 0.1 % Apply 1 application topically 2 (two) times daily. Patient not taking: Reported on 05/12/2016 05/14/15   Lance Bosch, NP     Objective:   Vitals:   05/12/16 1456  BP: 124/68  Pulse: 76  Resp: 16  Temp: 97.4 F (36.3 C)  TempSrc: Oral  SpO2: 96%  Weight: 146 lb (66.2 kg)  Height: 5\' 8"  (1.727 m)    Exam General appearance : Awake, alert, not in any distress. Speech Clear. Not toxic looking, pleasant. HEENT: Atraumatic and Normocephalic, pupils equally reactive to light. Neck: supple, no JVD.  Chest:Good air entry bilaterally, no added sounds. CVS: S1 S2 regular, no murmurs/gallups or rubs. Abdomen: Bowel sounds active, Non tender and not distended with no gaurding, rigidity or rebound. Extremities: B/L Lower Ext shows 1+ edema, much improved since last eval though.  both legs are warm to touch Neurology: Awake alert, and oriented X 3, CN II-XII grossly intact, Non focal Skin:No Rash  Data Review Lab Results  Component Value Date   HGBA1C 5.0 04/21/2016    Depression screen [facility] 2/9 05/12/2016 04/21/2016 03/28/2015  Decreased Interest 0 2 1  Down, Depressed, Hopeless 0 2 1  PHQ - 2 Score 0  4 2  Altered sleeping 0 3 3  Tired, decreased energy 0 2 1  Change in appetite 1 3 3   Feeling bad or failure about yourself  0 1 2  Trouble concentrating 0 0 0  Moving slowly or fidgety/restless 0 0 0  Suicidal thoughts - 2 1  PHQ-9 Score 1 15 12       Assessment & Plan     1 Pedal edema Improved, continue bp meds, and low salt diet, recd picking up rx for Ted hose as well. rcd elevate legs at night when in bed  2. Vitamin D deficiency, in setting of Osteoporosis - VITAMIN D 25 Hydroxy (Vit-D Deficiency, Fractures); Future - to chk in 1 month or so when done w/ rx - once vit d levels  normal, will start alendronate for osteoporosis.  3. Colon cancer screening - Ambulatory referral to Gastroenterology - pt would prefer diff group than [facility]. She thinks her last colonscopy was over 5 years ago, but does not recall findings or recds on when repeat.  4. Recent MM Negative, repeat in 2 years.  5. Labs/studies;  D/w pt findings of recent labs/studies.   Patient have been counseled extensively about nutrition and exercise  Return in about 3 months (around 08/12/2016).  The patient was given clear instructions to go to ER or return to medical center if symptoms don't improve, worsen or new problems develop. The patient verbalized understanding. The patient was told to call to get lab results if they haven't heard anything in the next week.   This note has been created with Surveyor, quantity. Any transcriptional errors are unintentional.   Maren Reamer, MD, [place] and [facility] [place], [place]   05/12/2016, 5:45 PM

## 2016-05-12 NOTE — Progress Notes (Signed)
F/u left foot edema.

## 2016-05-12 NOTE — Patient Instructions (Addendum)
- lab appt in 1 month for blood draw. - future labs placed  -  Edema Edema is an abnormal buildup of fluids. It is more common in your legs and thighs. Painless swelling of the feet and ankles is more likely as a person ages. It also is common in looser skin, like around your eyes. HOME CARE   Keep the affected body part above the level of the heart while lying down.  Do not sit still or stand for a long time.  Do not put anything right under your knees when you lie down.  Do not wear tight clothes on your upper legs.  Exercise your legs to help the puffiness (swelling) go down.  Wear elastic bandages or support stockings as told by your doctor.  A low-salt diet may help lessen the puffiness.  Only take medicine as told by your doctor. GET HELP IF:  Treatment is not working.  You have heart, liver, or kidney disease and notice that your skin looks puffy or shiny.  You have puffiness in your legs that does not get better when you raise your legs.  You have sudden weight gain for no reason. GET HELP RIGHT AWAY IF:   You have shortness of breath or chest pain.  You cannot breathe when you lie down.  You have pain, redness, or warmth in the areas that are puffy.  You have heart, liver, or kidney disease and get edema all of a sudden.  You have a fever and your symptoms get worse all of a sudden. MAKE SURE YOU:   Understand these instructions.  Will watch your condition.  Will get help right away if you are not doing well or get worse.   This information is not intended to replace advice given to you by your health care provider. Make sure you discuss any questions you have with your health care provider.   Document Released: 12/17/2007 Document Revised: 07/05/2013 Document Reviewed: 04/22/2013 Elsevier Interactive Patient Education 2016 [place] DASH stands for "Dietary Approaches to Stop Hypertension." The DASH eating plan is a  healthy eating plan that has been shown to reduce high blood pressure (hypertension). Additional health benefits may include reducing the risk of type 2 diabetes mellitus, heart disease, and stroke. The DASH eating plan may also help with weight loss. WHAT DO I NEED TO KNOW ABOUT THE DASH EATING PLAN? For the DASH eating plan, you will follow these general guidelines:  Choose foods with a percent daily value for sodium of less than 5% (as listed on the food label).  Use salt-free seasonings or herbs instead of table salt or sea salt.  Check with your health care provider or pharmacist before using salt substitutes.  Eat lower-sodium products, often labeled as "lower sodium" or "no salt added."  Eat fresh foods.  Eat more vegetables, fruits, and low-fat dairy products.  Choose whole grains. Look for the word "whole" as the first word in the ingredient list.  Choose fish and skinless chicken or [place] more often than red meat. Limit fish, poultry, and meat to 6 oz (170 g) each day.  Limit sweets, desserts, sugars, and sugary drinks.  Choose heart-healthy fats.  Limit cheese to 1 oz (28 g) per day.  Eat more home-cooked food and less restaurant, buffet, and fast food.  Limit fried foods.  Cook foods using methods other than frying.  Limit canned vegetables. If you do use them, rinse them well to  decrease the sodium.  When eating at a restaurant, ask that your food be prepared with less salt, or no salt if possible. WHAT FOODS CAN I EAT? Seek help from a dietitian for individual calorie needs. Grains Whole grain or whole wheat bread. Brown rice. Whole grain or whole wheat pasta. Quinoa, bulgur, and whole grain cereals. Low-sodium cereals. Corn or whole wheat flour tortillas. Whole grain cornbread. Whole grain crackers. Low-sodium crackers. Vegetables Fresh or frozen vegetables (raw, steamed, roasted, or grilled). Low-sodium or reduced-sodium tomato and vegetable juices. Low-sodium  or reduced-sodium tomato sauce and paste. Low-sodium or reduced-sodium canned vegetables.  Fruits All fresh, canned (in natural juice), or frozen fruits. Meat and Other Protein Products Ground beef (85% or leaner), grass-fed beef, or beef trimmed of fat. Skinless chicken or [place]. Ground chicken or [place]. Pork trimmed of fat. All fish and seafood. Eggs. Dried beans, peas, or lentils. Unsalted nuts and seeds. Unsalted canned beans. Dairy Low-fat dairy products, such as skim or 1% milk, 2% or reduced-fat cheeses, low-fat ricotta or cottage cheese, or plain low-fat yogurt. Low-sodium or reduced-sodium cheeses. Fats and Oils Tub margarines without trans fats. Light or reduced-fat mayonnaise and salad dressings (reduced sodium). Avocado. Safflower, olive, or canola oils. Natural peanut or almond butter. Other Unsalted popcorn and pretzels. The items listed above may not be a complete list of recommended foods or beverages. Contact your dietitian for more options. WHAT FOODS ARE NOT RECOMMENDED? Grains White bread. White pasta. White rice. Refined cornbread. Bagels and croissants. Crackers that contain trans fat. Vegetables Creamed or fried vegetables. Vegetables in a cheese sauce. Regular canned vegetables. Regular canned tomato sauce and paste. Regular tomato and vegetable juices. Fruits Dried fruits. Canned fruit in light or heavy syrup. Fruit juice. Meat and Other Protein Products Fatty cuts of meat. Ribs, chicken wings, bacon, sausage, bologna, salami, chitterlings, fatback, hot dogs, bratwurst, and packaged luncheon meats. Salted nuts and seeds. Canned beans with salt. Dairy Whole or 2% milk, cream, half-and-half, and cream cheese. Whole-fat or sweetened yogurt. Full-fat cheeses or blue cheese. Nondairy creamers and whipped toppings. Processed cheese, cheese spreads, or cheese curds. Condiments Onion and garlic salt, seasoned salt, table salt, and sea salt. Canned and packaged gravies.  Worcestershire sauce. Tartar sauce. Barbecue sauce. Teriyaki sauce. Soy sauce, including reduced sodium. Steak sauce. Fish sauce. Oyster sauce. Cocktail sauce. Horseradish. Ketchup and mustard. Meat flavorings and tenderizers. Bouillon cubes. Hot sauce. Tabasco sauce. Marinades. Taco seasonings. Relishes. Fats and Oils Butter, stick margarine, lard, shortening, ghee, and bacon fat. Coconut, palm kernel, or palm oils. Regular salad dressings. Other Pickles and olives. Salted popcorn and pretzels. The items listed above may not be a complete list of foods and beverages to avoid. Contact your dietitian for more information. WHERE CAN I FIND MORE INFORMATION? National Heart, Lung, and Blood Institute: travelstabloid.com   This information is not intended to replace advice given to you by your health care provider. Make sure you discuss any questions you have with your health care provider.   Document Released: 06/19/2011 Document Revised: 07/21/2014 Document Reviewed: 05/04/2013 Elsevier Interactive Patient Education 2016 [employer]. Tdap Vaccine (Tetanus, Diphtheria and Pertussis): What You Need to Know 1. Why get vaccinated? Tetanus, diphtheria and pertussis are very serious diseases. Tdap vaccine can protect [place] from these diseases. And, Tdap vaccine given to pregnant women can protect newborn babies against pertussis. TETANUS (Lockjaw) is rare in the [place] today. It causes painful muscle tightening and stiffness, usually all over the body.  It  can lead to tightening of muscles in the head and neck so you can't open your mouth, swallow, or sometimes even breathe. Tetanus kills about 1 out of 10 people who are infected even after receiving the best medical care. DIPHTHERIA is also rare in the [place] today. It can cause a thick coating to form in the back of the throat.  It can lead to breathing problems, heart failure, paralysis, and  death. PERTUSSIS (Whooping Cough) causes severe coughing spells, which can cause difficulty breathing, vomiting and disturbed sleep.  It can also lead to weight loss, incontinence, and rib fractures. Up to 2 in 100 adolescents and 5 in 100 adults with pertussis are hospitalized or have complications, which could include pneumonia or death. These diseases are caused by bacteria. Diphtheria and pertussis are spread from person to person through secretions from coughing or sneezing. Tetanus enters the body through cuts, scratches, or wounds. Before vaccines, as many as 200,000 cases of diphtheria, 200,000 cases of pertussis, and hundreds of cases of tetanus, were reported in the [place] each year. Since vaccination began, reports of cases for tetanus and diphtheria have dropped by about 99% and for pertussis by about 80%. 2. Tdap vaccine Tdap vaccine can protect adolescents and adults from tetanus, diphtheria, and pertussis. One dose of Tdap is routinely given at age 31 or 41. People who did not get Tdap at that age should get it as soon as possible. Tdap is especially important for healthcare professionals and anyone having close contact with a baby younger than 12 months. Pregnant women should get a dose of Tdap during every pregnancy, to protect the newborn from pertussis. Infants are most at risk for severe, life-threatening complications from pertussis. Another vaccine, called Td, protects against tetanus and diphtheria, but not pertussis. A Td booster should be given every 10 years. Tdap may be given as one of these boosters if you have never gotten Tdap before. Tdap may also be given after a severe cut or burn to prevent tetanus infection. Your doctor or the person giving you the vaccine can give you more information. Tdap may safely be given at the same time as other vaccines. 3. Some people should not get this vaccine  A person who has ever had a life-threatening allergic reaction after a  previous dose of any diphtheria, tetanus or pertussis containing vaccine, OR has a severe allergy to any part of this vaccine, should not get Tdap vaccine. Tell the person giving the vaccine about any severe allergies.  Anyone who had coma or long repeated seizures within 7 days after a childhood dose of DTP or DTaP, or a previous dose of Tdap, should not get Tdap, unless a cause other than the vaccine was found. They can still get Td.  Talk to your doctor if you:  have seizures or another nervous system problem,  had severe pain or swelling after any vaccine containing diphtheria, tetanus or pertussis,  ever had a condition called Guillain-Barr Syndrome (GBS),  aren't feeling well on the day the shot is scheduled. 4. Risks With any medicine, including vaccines, there is a chance of side effects. These are usually mild and go away on their own. Serious reactions are also possible but are rare. Most people who get Tdap vaccine do not have any problems with it. Mild problems following Tdap (Did not interfere with activities)  Pain where the shot was given (about 3 in 4 adolescents or 2 in 3 adults)  Redness or swelling  where the shot was given (about 1 person in 5)  Mild fever of at least 100.62F (up to about 1 in 25 adolescents or 1 in 100 adults)  Headache (about 3 or 4 people in 10)  Tiredness (about 1 person in 3 or 4)  Nausea, vomiting, diarrhea, stomach ache (up to 1 in 4 adolescents or 1 in 10 adults)  Chills, sore joints (about 1 person in 10)  Body aches (about 1 person in 3 or 4)  Rash, swollen glands (uncommon) Moderate problems following Tdap (Interfered with activities, but did not require medical attention)  Pain where the shot was given (up to 1 in 5 or 6)  Redness or swelling where the shot was given (up to about 1 in 16 adolescents or 1 in 12 adults)  Fever over 102F (about 1 in 100 adolescents or 1 in 250 adults)  Headache (about 1 in 7 adolescents or 1  in 10 adults)  Nausea, vomiting, diarrhea, stomach ache (up to 1 or 3 people in 100)  Swelling of the entire arm where the shot was given (up to about 1 in 500). Severe problems following Tdap (Unable to perform usual activities; required medical attention)  Swelling, severe pain, bleeding and redness in the arm where the shot was given (rare). Problems that could happen after any vaccine:  People sometimes faint after a medical procedure, including vaccination. Sitting or lying down for about 15 minutes can help prevent fainting, and injuries caused by a fall. Tell your doctor if you feel dizzy, or have vision changes or ringing in the ears.  Some people get severe pain in the shoulder and have difficulty moving the arm where a shot was given. This happens very rarely.  Any medication can cause a severe allergic reaction. Such reactions from a vaccine are very rare, estimated at fewer than 1 in a million doses, and would happen within a few minutes to a few hours after the vaccination. As with any medicine, there is a very remote chance of a vaccine causing a serious injury or death. The safety of vaccines is always being monitored. For more information, visit: http://www.aguilar.org/ 5. What if there is a serious problem? What should I look for?  Look for anything that concerns you, such as signs of a severe allergic reaction, very high fever, or unusual behavior.  Signs of a severe allergic reaction can include hives, swelling of the face and throat, difficulty breathing, a fast heartbeat, dizziness, and weakness. These would usually start a few minutes to a few hours after the vaccination. What should I do?  If you think it is a severe allergic reaction or other emergency that can't wait, call 9-1-1 or get the person to the nearest hospital. Otherwise, call your doctor.  Afterward, the reaction should be reported to the Vaccine Adverse Event Reporting System (VAERS). Your doctor  might file this report, or you can do it yourself through the VAERS web site at www.vaers.SamedayNews.es, or by calling 936-173-6559. VAERS does not give medical advice.  6. The National Vaccine Injury Compensation Program The [employer] Vaccine Injury Compensation Program (VICP) is a federal program that was created to compensate people who may have been injured by certain vaccines. Persons who believe they may have been injured by a vaccine can learn about the program and about filing a claim by calling (262)404-4139 or visiting the [place] website at GoldCloset.com.ee. There is a time limit to file a claim for compensation. 7. How can I learn  more?  Ask your doctor. He or she can give you the vaccine package insert or suggest other sources of information.  Call your local or state health department.  Contact the Centers for Disease Control and Prevention (CDC):  Call 240-263-0350 (1-800-CDC-INFO) or  Visit CDC's website at http://hunter.com/ CDC Tdap Vaccine VIS (09/06/13)   This information is not intended to replace advice given to you by your health care provider. Make sure you discuss any questions you have with your health care provider.   Document Released: 12/30/2011 Document Revised: 07/21/2014 Document Reviewed: 10/12/2013 Elsevier Interactive Patient Education [employer].

## 2016-05-13 LAB — VITAMIN D 25 HYDROXY (VIT D DEFICIENCY, FRACTURES): Vit D, 25-Hydroxy: 21 ng/mL — ABNORMAL LOW (ref 30–100)

## 2016-05-15 ENCOUNTER — Telehealth: Payer: Self-pay

## 2016-05-15 NOTE — Telephone Encounter (Signed)
Contacted pt to go over lab results pt didn't answer and was unable to lvm will try again another day/time

## 2016-05-23 ENCOUNTER — Encounter: Payer: Self-pay | Admitting: Gastroenterology

## 2016-06-16 ENCOUNTER — Other Ambulatory Visit: Payer: Self-pay | Admitting: *Deleted

## 2016-06-16 ENCOUNTER — Encounter: Payer: Self-pay | Admitting: Family

## 2016-06-16 DIAGNOSIS — I739 Peripheral vascular disease, unspecified: Secondary | ICD-10-CM

## 2016-06-19 ENCOUNTER — Encounter: Payer: Self-pay | Admitting: Family

## 2016-06-19 ENCOUNTER — Ambulatory Visit (HOSPITAL_COMMUNITY)
Admission: RE | Admit: 2016-06-19 | Discharge: 2016-06-19 | Disposition: A | Discharge status: Home / Self Care | Payer: Commercial Managed Care - HMO | Source: Ambulatory Visit | Attending: Vascular Surgery | Admitting: Vascular Surgery

## 2016-06-19 ENCOUNTER — Ambulatory Visit (INDEPENDENT_AMBULATORY_CARE_PROVIDER_SITE_OTHER): Payer: Commercial Managed Care - HMO | Admitting: Family

## 2016-06-19 VITALS — BP 121/70 | HR 80 | Temp 97.6°F | Resp 16 | Ht 68.0 in | Wt 146.0 lb

## 2016-06-19 DIAGNOSIS — I872 Venous insufficiency (chronic) (peripheral): Secondary | ICD-10-CM | POA: Diagnosis not present

## 2016-06-19 DIAGNOSIS — I779 Disorder of arteries and arterioles, unspecified: Secondary | ICD-10-CM | POA: Diagnosis not present

## 2016-06-19 DIAGNOSIS — I739 Peripheral vascular disease, unspecified: Secondary | ICD-10-CM | POA: Diagnosis not present

## 2016-06-19 NOTE — Progress Notes (Signed)
[facility] [place]   CC: Follow up peripheral artery occlusive disease  History of Present Illness Tina Waller is a 75 y.o. female patient of Dr. Oneida Alar who has a history of dependent ankle edema and peripheral arterial disease. The patient states this has been going on for several years. She denies claudication. She states her walking distance is primarily limited by shortness of breath which occurs at about a half a block. She denies rest pain. She denies any nonhealing wounds.  She did have a stroke in 2008 which left her with some speech difficulties but the right side weakness did resolve. Other medical problems include hypertension, diabetes, prior stroke, anemia all of which are currently stable. She is a former smoker quit in 2008.  Dr. Oneida Alar last evaluated pt on 12-13-15. AT that time bilateral ABI's were 0.5 with monophasic waveforms Patient with objective evidence of peripheral arterial disease but asymptomatic. She has no claudication or rest pain or nonhealing wounds. Although she does have occasional ankle swelling this is usually not related to arterial occlusive disease. Most likely this is dependency related. Her walking distance is primarily limited by dyspnea on exertion. Patient was to continue to try to walk for 30 minutes daily to improve collateralization.   She states that most of the swelling in her ankles resolves with overnight elevation of her feet.   She returns today for 6 months follow up.   Pt Diabetic: No, A1C on 04-21-16 was 5.0 (review of records) Pt smoker: former smoker, quit in 2008 when she had a stroke; started smoking at age 29 years  Pt meds include: Statin :Yes Betablocker: Yes ASA: Yes Other anticoagulants/antiplatelets: no  Past Medical History:  Diagnosis Date  . Anemia   . Diabetes mellitus without complication ([place])   . Hypertension   . Stroke [facility])     Social History Social History  Substance Use Topics   . Smoking status: Former Smoker    Quit date: 07/14/2006  . Smokeless tobacco: Never Used  . Alcohol use 4.2 oz/week    7 Shots of liquor per week     Comment: wine and vodka    Family History Family History  Problem Relation Age of Onset  . Heart disease Mother     Past Surgical History:  Procedure Laterality Date  . ABDOMINAL HYSTERECTOMY      No Known Allergies  Current Outpatient Prescriptions  Medication Sig Dispense Refill  . allopurinol (ZYLOPRIM) 100 MG tablet Take 1 tablet (100 mg total) by mouth daily. 90 tablet 3  . amLODipine (NORVASC) 10 MG tablet Take 1 tablet (10 mg total) by mouth daily. 90 tablet 3  . aspirin EC 81 MG tablet Take 81 mg by mouth daily.    Marland Kitchen losartan-hydrochlorothiazide (HYZAAR) 100-25 MG tablet Take 1 tablet by mouth daily. 90 tablet 3  . metoprolol succinate (TOPROL-XL) 25 MG 24 hr tablet Take 1 tablet (25 mg total) by mouth daily. 90 tablet 3  . sertraline (ZOLOFT) 50 MG tablet Take 1 tablet (50 mg total) by mouth at bedtime. 90 tablet 2  . Vitamin D, Ergocalciferol, (DRISDOL) 50000 units CAPS capsule Take 1 capsule (50,000 Units total) by mouth every 7 (seven) days. 12 capsule 0  . atorvastatin (LIPITOR) 10 MG tablet Take 1 tablet (10 mg total) by mouth daily. For cholesterol (Patient not taking: Reported on 06/19/2016) 90 tablet 3  . triamcinolone cream (KENALOG) 0.1 % Apply 1 application topically 2 (two) times daily. (Patient  not taking: Reported on 06/19/2016) 80 g 1   No current facility-administered medications for this visit.     ROS: See HPI for pertinent positives and negatives.   Physical Examination  Vitals:   06/19/16 1456  BP: 121/70  Pulse: 80  Resp: 16  Temp: 97.6 F (36.4 C)  TempSrc: Oral  SpO2: (!) 16%  Weight: 146 lb (66.2 kg)  Height: 5\' 8"  (1.727 m)   Body mass index is 22.2 kg/m.  General: A&O x 3, WDWN, female. Gait: slow, steady Eyes: PERRLA. Pulmonary: Respirations are non labored, CTAB, good air  movement Cardiac: regular Rhythm, no detected murmur.         Carotid Bruits Right Left   Negative Negative  Aorta is not palpable. Radial pulses: 2+ palpable and =                           VASCULAR EXAM: Extremities without ischemic changes,  without Gangrene; without open wounds. With 2-3+ pitting edema in bilateral ankles and dorsal aspect both feet. + stasis dermatitis in both lower legs/ankles.                                                                                                          LE Pulses Right Left       FEMORAL  1+ palpable  1+ palpable        POPLITEAL  not palpable   not palpable       POSTERIOR TIBIAL  not palpable   not palpable        DORSALIS PEDIS      ANTERIOR TIBIAL not palpable  not palpable    Abdomen: soft, NT, no palpable masses. Skin: no rashes, no ulcers. Musculoskeletal: no muscle wasting or atrophy.  Neurologic: A&O X 3; Appropriate Affect ; SENSATION: normal; MOTOR FUNCTION:  moving all extremities equally, motor strength 5/5 throughout. Speech is fluent/normal. CN 2-12 intact.    Non-Invasive Vascular Imaging: DATE: 06/19/2016 ABI:  RIGHT: 0.67 (0.47 on 12-13-15), Waveforms: monophasic; TBI: 0.34  LEFT: 0.62 (0.52), Waveforms: monophasic; TBI: dampened waveform   ASSESSMENT: Tina Waller is a 75 y.o. female who presents with asymptomatic peripheral artery occlusive disease. She can walk 15 minutes before she becomes dyspneic; she walks two 15 minute intervals daily which is more than she had been walking, is making a concerted effort to walk 30 minutes daily as advised by Dr. Oneida Alar.  She has no signs of ischemia in her feet/legs. Bilateral ABI's have improved.   Chronic venous insufficiency with stasis dermatitis in her lower legs and ankles, 2-3+ pitting edema in her ankles and dorsal aspects both feet. Edema is mostly resolved with elevation of feet overnight. She has a pair of knee high graduated compression hose but has  never put them on as they are too difficult for her to donn. See Plan.   PLAN:  Continue to walk 30 minutes daily.  Graduated knee high compression hose, see patient instructions.  Obtain donning devise from medical  supply store.   Based on the patient's vascular studies and examination, pt will return to clinic in 6 months with ABI's. I advised her to return sooner if she develops non healing wounds or other concerns re the circulation in her legs/feet.   I discussed in depth with the patient the nature of atherosclerosis, and emphasized the importance of maximal medical management including strict control of blood pressure, blood glucose, and lipid levels, obtaining regular exercise, and continued cessation of smoking.  The patient is aware that without maximal medical management the underlying atherosclerotic disease process will progress, limiting the benefit of any interventions.  The patient was given information about PAD including signs, symptoms, treatment, what symptoms should prompt the patient to seek immediate medical care, and risk reduction measures to take.  Clemon Chambers, RN, MSN, FNP-C [facility] Phone: [phone]  Clinic MD: Oneida Alar  06/19/16 3:01 PM

## 2016-06-19 NOTE — Patient Instructions (Addendum)
Peripheral Vascular Disease Peripheral vascular disease (PVD) is a disease of the blood vessels that are not part of your heart and brain. A simple term for PVD is poor circulation. In most cases, PVD narrows the blood vessels that carry blood from your heart to the rest of your body. This can result in a decreased supply of blood to your arms, legs, and internal organs, like your stomach or kidneys. However, it most often affects a person's lower legs and feet. There are two types of PVD.  Organic PVD. This is the more common type. It is caused by damage to the structure of blood vessels.  Functional PVD. This is caused by conditions that make blood vessels contract and tighten (spasm). Without treatment, PVD tends to get worse over time. PVD can also lead to acute ischemic limb. This is when an arm or limb suddenly has trouble getting enough blood. This is a medical emergency. Follow these instructions at home:  Take medicines only as told by your doctor.  Do not use any tobacco products, including cigarettes, chewing tobacco, or electronic cigarettes. If you need help quitting, ask your doctor.  Lose weight if you are overweight, and maintain a healthy weight as told by your doctor.  Eat a diet that is low in fat and cholesterol. If you need help, ask your doctor.  Exercise regularly. Ask your doctor for some good activities for you.  Take good care of your feet.  Wear comfortable shoes that fit well.  Check your feet often for any cuts or sores. Contact a doctor if:  You have cramps in your legs while walking.  You have leg pain when you are at rest.  You have coldness in a leg or foot.  Your skin changes.  You are unable to get or have an erection (erectile dysfunction).  You have cuts or sores on your feet that are not healing. Get help right away if:  Your arm or leg turns cold and blue.  Your arms or legs become red, warm, swollen, painful, or numb.  You have  chest pain or trouble breathing.  You suddenly have weakness in your face, arm, or leg.  You become very confused or you cannot speak.  You suddenly have a very bad headache.  You suddenly cannot see. This information is not intended to replace advice given to you by your health care provider. Make sure you discuss any questions you have with your health care provider. Document Released: 09/24/2009 Document Revised: 12/06/2015 Document Reviewed: 12/08/2013 Elsevier Interactive Patient Education  2017 [employer].     Venous Stasis or Chronic Venous Insufficiency Chronic venous insufficiency, also called venous stasis, is a condition that affects the veins in the legs. The condition prevents blood from being pumped through these veins effectively. Blood may no longer be pumped effectively from the legs back to the heart. This condition can range from mild to severe. With proper treatment, you should be able to continue with an active life. CAUSES  Chronic venous insufficiency occurs when the vein walls become stretched, weakened, or damaged or when valves within the vein are damaged. Some common causes of this include:  High blood pressure inside the veins (venous hypertension).  Increased blood pressure in the leg veins from long periods of sitting or standing.  A blood clot that blocks blood flow in a vein (deep vein thrombosis).  Inflammation of a superficial vein (phlebitis) that causes a blood clot to form. RISK FACTORS Various things  can make you more likely to develop chronic venous insufficiency, including:  Family history of this condition.  Obesity.  Pregnancy.  Sedentary lifestyle.  Smoking.  Jobs requiring long periods of standing or sitting in one place.  Being a certain age. Women in their 87s and 34s and men in their 57s are more likely to develop this condition. SIGNS AND SYMPTOMS  Symptoms may include:   Varicose veins.  Skin breakdown or  ulcers.  Reddened or discolored skin on the leg.  Brown, smooth, tight, and painful skin just above the ankle, usually on the inside surface (lipodermatosclerosis).  Swelling. DIAGNOSIS  To diagnose this condition, your health care provider will take a medical history and do a physical exam. The following tests may be ordered to confirm the diagnosis:  Duplex ultrasound-A procedure that produces a picture of a blood vessel and nearby organs and also provides information on blood flow through the blood vessel.  Plethysmography-A procedure that tests blood flow.  A venogram, or venography-A procedure used to look at the veins using X-ray and dye. TREATMENT The goals of treatment are to help you return to an active life and to minimize pain or disability. Treatment will depend on the severity of the condition. Medical procedures may be needed for severe cases. Treatment options may include:   Use of compression stockings. These can help with symptoms and lower the chances of the problem getting worse, but they do not cure the problem.  Sclerotherapy-A procedure involving an injection of a material that "dissolves" the damaged veins. Other veins in the network of blood vessels take over the function of the damaged veins.  Surgery to remove the vein or cut off blood flow through the vein (vein stripping or laser ablation surgery).  Surgery to repair a valve. HOME CARE INSTRUCTIONS   Wear compression stockings as directed by your health care provider.  Only take over-the-counter or prescription medicines for pain, discomfort, or fever as directed by your health care provider.  Follow up with your health care provider as directed. SEEK MEDICAL CARE IF:   You have redness, swelling, or increasing pain in the affected area.  You see a red streak or line that extends up or down from the affected area.  You have a breakdown or loss of skin in the affected area, even if the breakdown is  small.  You have an injury to the affected area. SEEK IMMEDIATE MEDICAL CARE IF:   You have an injury and open wound in the affected area.  Your pain is severe and does not improve with medicine.  You have sudden numbness or weakness in the foot or ankle below the affected area, or you have trouble moving your foot or ankle.  You have a fever or persistent symptoms for more than 2-3 days.  You have a fever and your symptoms suddenly get worse. MAKE SURE YOU:   Understand these instructions.  Will watch your condition.  Will get help right away if you are not doing well or get worse. This information is not intended to replace advice given to you by your health care provider. Make sure you discuss any questions you have with your health care provider. Document Released: 11/03/2006 Document Revised: 04/20/2013 Document Reviewed: 03/07/2013 Elsevier Interactive Patient Education  2017 [employer]     To measure for knee high compression hose: Measure the length of calf, largest circumference of calf, and ankle circumference first thing in the morning before your legs have a  chance to swell.  Take these 3 measurements with you to obtain 20-30 mm mercury graduated knee high compression hose.  Put the stockings on in the morning, remove at bedtime.

## 2016-07-18 ENCOUNTER — Ambulatory Visit (AMBULATORY_SURGERY_CENTER): Payer: Self-pay | Admitting: *Deleted

## 2016-07-18 VITALS — Ht 69.0 in | Wt 144.2 lb

## 2016-07-18 DIAGNOSIS — Z1211 Encounter for screening for malignant neoplasm of colon: Secondary | ICD-10-CM

## 2016-07-18 MED ORDER — NA SULFATE-K SULFATE-MG SULF 17.5-3.13-1.6 GM/177ML PO SOLN: ORAL | 0 refills | Status: DC

## 2016-07-18 NOTE — Progress Notes (Addendum)
Pt denies allergies to eggs or soy products. Denies difficulty with sedation or anesthesia. Denies any diet or weight loss medications. Denies use of supplemental oxygen.  Emmi instructions not given for procedure. Patient does not have access to internet.

## 2016-08-01 ENCOUNTER — Encounter: Payer: Commercial Managed Care - HMO | Admitting: Gastroenterology

## 2016-09-25 ENCOUNTER — Encounter: Payer: Self-pay | Admitting: Gastroenterology

## 2016-09-25 ENCOUNTER — Ambulatory Visit (AMBULATORY_SURGERY_CENTER): Payer: Medicare HMO | Admitting: Gastroenterology

## 2016-09-25 VITALS — BP 138/64 | HR 83 | Temp 95.1°F | Resp 14 | Ht 69.0 in | Wt 144.0 lb

## 2016-09-25 DIAGNOSIS — Z8673 Personal history of transient ischemic attack (TIA), and cerebral infarction without residual deficits: Secondary | ICD-10-CM | POA: Diagnosis not present

## 2016-09-25 DIAGNOSIS — I10 Essential (primary) hypertension: Secondary | ICD-10-CM | POA: Diagnosis not present

## 2016-09-25 DIAGNOSIS — Z1211 Encounter for screening for malignant neoplasm of colon: Secondary | ICD-10-CM | POA: Diagnosis not present

## 2016-09-25 DIAGNOSIS — D124 Benign neoplasm of descending colon: Secondary | ICD-10-CM | POA: Diagnosis not present

## 2016-09-25 DIAGNOSIS — Z1212 Encounter for screening for malignant neoplasm of rectum: Secondary | ICD-10-CM | POA: Diagnosis not present

## 2016-09-25 DIAGNOSIS — D122 Benign neoplasm of ascending colon: Secondary | ICD-10-CM

## 2016-09-25 MED ORDER — SODIUM CHLORIDE 0.9 % IV SOLN: 500.0000 mL | INTRAVENOUS | Status: DC

## 2016-09-25 NOTE — Patient Instructions (Signed)
YOU HAD AN ENDOSCOPIC PROCEDURE TODAY AT THE [facility]:   Refer to the procedure report that was given to you for any specific questions about what was found during the examination.  If the procedure report does not answer your questions, please call your gastroenterologist to clarify.  If you requested that your care partner not be given the details of your procedure findings, then the procedure report has been included in a sealed envelope for you to review at your convenience later.  YOU SHOULD EXPECT: Some feelings of bloating in the abdomen. Passage of more gas than usual.  Walking can help get rid of the air that was put into your GI tract during the procedure and reduce the bloating. If you had a lower endoscopy (such as a colonoscopy or flexible sigmoidoscopy) you may notice spotting of blood in your stool or on the toilet paper. If you underwent a bowel prep for your procedure, you may not have a normal bowel movement for a few days.  Please Note:  You might notice some irritation and congestion in your nose or some drainage.  This is from the oxygen used during your procedure.  There is no need for concern and it should clear up in a day or so.  SYMPTOMS TO REPORT IMMEDIATELY:   Following lower endoscopy (colonoscopy or flexible sigmoidoscopy):  Excessive amounts of blood in the stool  Significant tenderness or worsening of abdominal pains  Swelling of the abdomen that is new, acute  Fever of 100F or higher    For urgent or emergent issues, a gastroenterologist can be reached at any hour by calling [phone].   DIET:  We do recommend a small meal at first, but then you may proceed to your regular diet.  Drink plenty of fluids but you should avoid alcoholic beverages for 24 hours.  ACTIVITY:  You should plan to take it easy for the rest of today and you should NOT DRIVE or use heavy machinery until tomorrow (because of the sedation medicines used during the test).     FOLLOW UP: Our staff will call the number listed on your records the next business day following your procedure to check on you and address any questions or concerns that you may have regarding the information given to you following your procedure. If we do not reach you, we will leave a message.  However, if you are feeling well and you are not experiencing any problems, there is no need to return our call.  We will assume that you have returned to your regular daily activities without incident.  If any biopsies were taken you will be contacted by phone or by letter within the next 1-3 weeks.  Please call us at [phone] if you have not heard about the biopsies in 3 weeks.    SIGNATURES/CONFIDENTIALITY: You and/or your care partner have signed paperwork which will be entered into your electronic medical record.  These signatures attest to the fact that that the information above on your After Visit Summary has been reviewed and is understood.  Full responsibility of the confidentiality of this discharge information lies with you and/or your care-partner.   Resume medications. Information given on polyps and diverticulosis. 

## 2016-09-25 NOTE — Progress Notes (Signed)
Pt's states no medical or surgical changes since previsit or office visit. 

## 2016-09-25 NOTE — Progress Notes (Signed)
To recovery, report to [place], RN, VSS 

## 2016-09-25 NOTE — Op Note (Signed)
[place] Patient Name: Tina Waller Procedure Date: 09/25/2016 11:13 AM MRN: [id number] Endoscopist: Mallie Mussel L. Loletha Carrow , MD Age: 77 Referring MD:  Date of Birth: [birth year] Gender: Female Account #: [id number] Procedure:                Colonoscopy Indications:              Screening for colorectal malignant neoplasm Medicines:                Monitored Anesthesia Care Procedure:                Pre-Anesthesia Assessment:                           - Prior to the procedure, a History and Physical                            was performed, and patient medications and                            allergies were reviewed. The patient's tolerance of                            previous anesthesia was also reviewed. The risks                            and benefits of the procedure and the sedation                            options and risks were discussed with the patient.                            All questions were answered, and informed consent                            was obtained. Anticoagulants: The patient has taken                            aspirin. It was decided not to withhold this                            medication prior to the procedure. ASA Grade                            Assessment: II - A patient with mild systemic                            disease. After reviewing the risks and benefits,                            the patient was deemed in satisfactory condition to                            undergo the procedure.  After obtaining informed consent, the colonoscope                            was passed under direct vision. Throughout the                            procedure, the patient's blood pressure, pulse, and                            oxygen saturations were monitored continuously. The                            Colonoscope was introduced through the anus and                            advanced to the the cecum, identified by                             appendiceal orifice and ileocecal valve. The                            colonoscopy was performed with difficulty due to                            poor bowel prep, significant looping and a tortuous                            colon. Successful completion of the procedure was                            aided by using manual pressure. The quality of the                            bowel preparation was poor (patient vomited AM prep                            dose). Extensive lavage performed to improve                            visualization. The ileocecal valve, appendiceal                            orifice, and rectum were photographed. Scope In: 11:37:21 AM Scope Out: 11:57:21 AM Scope Withdrawal Time: 0 hours 13 minutes 29 seconds  Total Procedure Duration: 0 hours 20 minutes 0 seconds  Findings:                 The digital rectal exam findings include decreased                            sphincter tone.                           A 4 mm polyp was found in the mid ascending colon.  The polyp was sessile. The polyp was removed with a                            cold snare. Resection and retrieval were complete.                           Diverticula were found in the entire colon.                           The exam was otherwise without abnormality on                            direct and retroflexion views. Complications:            No immediate complications. Estimated Blood Loss:     Estimated blood loss: none. Impression:               - Decreased sphincter tone found on digital rectal                            exam.                           - One 4 mm polyp in the mid ascending colon,                            removed with a cold snare. Resected and retrieved.                           - Diverticulosis in the entire examined colon.                           - The examination was otherwise normal on direct                             and retroflexion views (given the limitation of the                            poor preparation). Recommendation:           - Patient has a contact number available for                            emergencies. The signs and symptoms of potential                            delayed complications were discussed with the                            patient. Return to normal activities tomorrow.                            Written discharge instructions were provided to the  patient.                           - Resume previous diet.                           - Continue present medications.                           - Await pathology results.                           - No repeat colonoscopy due to age. Prisma Decarlo L. Loletha Carrow, MD 09/25/2016 12:01:57 PM This report has been signed electronically.

## 2016-09-25 NOTE — Progress Notes (Signed)
Called to room to assist during endoscopic procedure.  Patient ID and intended procedure confirmed with present staff. Received instructions for my participation in the procedure from the performing physician.  

## 2016-09-26 ENCOUNTER — Telehealth: Payer: Self-pay | Admitting: *Deleted

## 2016-09-26 NOTE — Telephone Encounter (Signed)
  Follow up Call-  Call back number 09/25/2016  Post procedure Call Back phone  # [phone]  Permission to leave phone message No  Some recent data might be hidden     Patient questions:  Do you have a fever, pain , or abdominal swelling? No. Pain Score  0 *  Have you tolerated food without any problems? Yes.    Have you been able to return to your normal activities? Yes.    Do you have any questions about your discharge instructions: Diet   No. Medications  No. Follow up visit  No.  Do you have questions or concerns about your Care? No.  Actions: * If pain score is 4 or above: No action needed, pain <4.

## 2016-09-30 ENCOUNTER — Encounter: Payer: Self-pay | Admitting: Gastroenterology

## 2016-11-12 ENCOUNTER — Ambulatory Visit: Payer: Medicare HMO | Attending: Internal Medicine | Admitting: Internal Medicine

## 2016-11-12 VITALS — BP 148/83 | HR 121 | Temp 97.9°F | Resp 16 | Wt 136.0 lb

## 2016-11-12 DIAGNOSIS — F329 Major depressive disorder, single episode, unspecified: Secondary | ICD-10-CM | POA: Insufficient documentation

## 2016-11-12 DIAGNOSIS — Z79899 Other long term (current) drug therapy: Secondary | ICD-10-CM | POA: Insufficient documentation

## 2016-11-12 DIAGNOSIS — R197 Diarrhea, unspecified: Secondary | ICD-10-CM

## 2016-11-12 DIAGNOSIS — Z7982 Long term (current) use of aspirin: Secondary | ICD-10-CM | POA: Diagnosis not present

## 2016-11-12 DIAGNOSIS — K529 Noninfective gastroenteritis and colitis, unspecified: Secondary | ICD-10-CM | POA: Insufficient documentation

## 2016-11-12 DIAGNOSIS — E119 Type 2 diabetes mellitus without complications: Secondary | ICD-10-CM | POA: Diagnosis not present

## 2016-11-12 DIAGNOSIS — F419 Anxiety disorder, unspecified: Secondary | ICD-10-CM | POA: Diagnosis not present

## 2016-11-12 DIAGNOSIS — Z8673 Personal history of transient ischemic attack (TIA), and cerebral infarction without residual deficits: Secondary | ICD-10-CM | POA: Diagnosis not present

## 2016-11-12 DIAGNOSIS — I10 Essential (primary) hypertension: Secondary | ICD-10-CM | POA: Insufficient documentation

## 2016-11-12 DIAGNOSIS — R110 Nausea: Secondary | ICD-10-CM | POA: Insufficient documentation

## 2016-11-12 MED ORDER — SACCHAROMYCES BOULARDII 250 MG PO CAPS: 250.0000 mg | ORAL_CAPSULE | Freq: Two times a day (BID) | ORAL | 1 refills | Status: DC

## 2016-11-12 MED ORDER — PROMETHAZINE HCL 12.5 MG PO TABS: 12.5000 mg | ORAL_TABLET | Freq: Four times a day (QID) | ORAL | 0 refills | Status: DC | PRN

## 2016-11-12 NOTE — Progress Notes (Signed)
Tina Waller, is a 76 y.o. female  XIP:[id number]  QBH:[id number]  DOB - 02-Nov-1940  Chief Complaint  Patient presents with  . Emesis  . Diarrhea        Subjective:   Asheley Waller is a 76 y.o. female here today for a follow up visit, last seen 05/12/17. Here w/ c/o of diarrhea that has been ongoing for almost 2 wks now.  Initially she had severe n/v and abd pain, but that resolved and now has diarrhea and the mild nausea.  She has been eating more saltines and bland foods, but everytime she eats gets loose watery stools thereafter.   Patient has No headache, No chest pain, No abdominal pain - No Nausea, No new weakness tingling or numbness, No Cough - SOB.  No problems updated.  ALLERGIES: No Known Allergies  PAST MEDICAL HISTORY: Past Medical History:  Diagnosis Date  . Anemia   . Anxiety   . Cataract   . Depression   . Diabetes mellitus without complication ([place])    pt denies  . Gout   . Hypertension   . Stroke [facility]) 2008    MEDICATIONS AT HOME: Prior to Admission medications   Medication Sig Start Date End Date Taking? Authorizing Provider  allopurinol (ZYLOPRIM) 100 MG tablet Take 1 tablet (100 mg total) by mouth daily. 04/21/16   Maren Reamer, MD  amLODipine (NORVASC) 10 MG tablet Take 1 tablet (10 mg total) by mouth daily. 04/21/16   Maren Reamer, MD  aspirin EC 81 MG tablet Take 81 mg by mouth daily.    Historical Provider, MD  atorvastatin (LIPITOR) 10 MG tablet Take 1 tablet (10 mg total) by mouth daily. For cholesterol Patient not taking: Reported on 09/25/2016 05/31/15   Lance Bosch, NP  losartan-hydrochlorothiazide (HYZAAR) 100-25 MG tablet Take 1 tablet by mouth daily. 04/21/16   Maren Reamer, MD  metoprolol succinate (TOPROL-XL) 25 MG 24 hr tablet Take 1 tablet (25 mg total) by mouth daily. 04/21/16   Maren Reamer, MD  promethazine (PHENERGAN) 12.5 MG tablet Take 1 tablet (12.5 mg total) by mouth every 6 (six) hours as needed for  nausea or vomiting. 11/12/16   Maren Reamer, MD  saccharomyces boulardii (FLORASTOR) 250 MG capsule Take 1 capsule (250 mg total) by mouth 2 (two) times daily. 11/12/16   Maren Reamer, MD  sertraline (ZOLOFT) 50 MG tablet Take 1 tablet (50 mg total) by mouth at bedtime. Patient not taking: Reported on 11/12/2016 05/12/16   Maren Reamer, MD  triamcinolone cream (KENALOG) 0.1 % Apply 1 application topically 2 (two) times daily. Patient not taking: Reported on 07/18/2016 05/14/15   Lance Bosch, NP  Vitamin D, Ergocalciferol, (DRISDOL) 50000 units CAPS capsule Take 1 capsule (50,000 Units total) by mouth every 7 (seven) days. Patient not taking: Reported on 11/12/2016 04/23/16   Maren Reamer, MD     Objective:   Vitals:   11/12/16 1103  BP: (!) 148/83  Pulse: (!) 121  Resp: 16  Temp: 97.9 F (36.6 C)  TempSrc: Oral  SpO2: 97%  Weight: 136 lb (61.7 kg)    Exam General appearance : Awake, alert, not in any distress. Speech Clear. Not toxic looking HEENT: Atraumatic and Normocephalic, pupils equally reactive to light. Neck: supple, no JVD. No cervical lymphadenopathy.  Chest:Good air entry bilaterally, no added sounds. CVS: S1 S2 regular, no murmurs/gallups or rubs. Abdomen: Bowel sounds active, Non tender and not distended with  no gaurding, rigidity or rebound. Extremities: B/L Lower Ext shows no edema, both legs are warm to touch Neurology: Awake alert, and oriented X 3, CN II-XII grossly intact, Non focal Skin:No Rash  Data Review Lab Results  Component Value Date   HGBA1C 5.0 04/21/2016    Depression screen [facility] 2/9 05/12/2016 04/21/2016 03/28/2015  Decreased Interest 0 2 1  Down, Depressed, Hopeless 0 2 1  PHQ - 2 Score 0 4 2  Altered sleeping 0 3 3  Tired, decreased energy 0 2 1  Change in appetite 1 3 3   Feeling bad or failure about yourself  0 1 2  Trouble concentrating 0 0 0  Moving slowly or fidgety/restless 0 0 0  Suicidal thoughts - 2 1  PHQ-9 Score 1  15 12      09/25/16 colonoscopy - Decreased sphincter tone found on digital rectal exam. - One 4 mm polyp in the mid ascending colon, removed with a cold snare. Resected and retrieved. - Diverticulosis in the entire examined colon. - The examination was otherwise normal on direct and retroflexion views (given the limitation of the poor preparation). Impression: - Patient has a contact number available for emergencies. The signs and symptoms of potential delayed complications were discussed with the patient. Return to normal activities tomorrow. Written discharge instructions were provided to the patient. - Resume previous diet. - Continue present medications. - Await pathology results. - No repeat colonoscopy due to age.  Assessment & Plan   1. Gastroenteritis -suspect viral - C difficile Toxins A+B W/Rflx; Future - CMP and Liver - CBC with Differential - Urinalysis; Future - unable to give [place] sample now - bland/brat diet encouraged - add probiotics, florastor 250bid. If cannot afford, than Activia yogurt bid for now to hopefully reintroduce gut flora.   2. Diarrhea, unspecified type See #1 - C difficile Toxins A+B W/Rflx; Future - CMP and Liver - CBC with Differential - Urinalysis; Future  3. Nausea Improved, phenergan rx prn for nausea     Patient have been counseled extensively about nutrition and exercise  Return in about 1 week (around 11/19/2016) for n/diarrhea.  The patient was given clear instructions to go to ER or return to medical center if symptoms don't improve, worsen or new problems develop. The patient verbalized understanding. The patient was told to call to get lab results if they haven't heard anything in the next week.   This note has been created with Surveyor, quantity. Any transcriptional errors are unintentional.   Maren Reamer, MD, [place] [facility] [place], [place]   11/12/2016, 11:41 AM

## 2016-11-12 NOTE — Patient Instructions (Addendum)
Activia yogurt (good source of probiotic 2 x day if unable to get Florastor probiotic pills   Bland Diet A bland diet consists of foods that do not have a lot of fat or fiber. Foods without fat or fiber are easier for the body to digest. They are also less likely to irritate your mouth, throat, stomach, and other parts of your gastrointestinal tract. A bland diet is sometimes called a BRAT diet. What is my plan? Your health care provider or dietitian may recommend specific changes to your diet to prevent and treat your symptoms, such as:  Eating small meals often.  Cooking food until it is soft enough to chew easily.  Chewing your food well.  Drinking fluids slowly.  Not eating foods that are very spicy, sour, or fatty.  Not eating citrus fruits, such as oranges and grapefruit. What do I need to know about this diet?  Eat a variety of foods from the bland diet food list.  Do not follow a bland diet longer than you have to.  Ask your health care provider whether you should take vitamins. What foods can I eat? Grains   Hot cereals, such as cream of wheat. Bread, crackers, or tortillas made from refined white flour. Rice. Vegetables  Canned or cooked vegetables. Mashed or boiled potatoes. Fruits  Bananas. Applesauce. Other types of cooked or canned fruit with the skin and seeds removed, such as canned peaches or pears. Meats and Other Protein Sources  Scrambled eggs. Creamy peanut butter or other nut butters. Lean, well-cooked meats, such as chicken or fish. Tofu. Soups or broths. Dairy  Low-fat dairy products, such as milk, cottage cheese, or yogurt. Beverages  Water. Herbal tea. Apple juice. Sweets and Desserts  Pudding. Custard. Fruit gelatin. Ice cream. Fats and Oils  Mild salad dressings. Canola or olive oil. The items listed above may not be a complete list of allowed foods or beverages. Contact your dietitian for more options.  What foods are not recommended? Foods  and ingredients that are often not recommended include:  Spicy foods, such as hot sauce or salsa.  Fried foods.  Sour foods, such as pickled or fermented foods.  Raw vegetables or fruits, especially citrus or berries.  Caffeinated drinks.  Alcohol.  Strongly flavored seasonings or condiments. The items listed above may not be a complete list of foods and beverages that are not allowed. Contact your dietitian for more information.  This information is not intended to replace advice given to you by your health care provider. Make sure you discuss any questions you have with your health care provider. Document Released: 10/22/2015 Document Revised: 12/06/2015 Document Reviewed: 07/12/2014 Elsevier Interactive Patient Education  2017 [employer]  -   Viral Gastroenteritis, Adult Viral gastroenteritis is also known as the stomach flu. This condition is caused by certain germs (viruses). These germs can be passed from person to person very easily (are very contagious). This condition can cause sudden watery poop (diarrhea), fever, and throwing up (vomiting). Having watery poop and throwing up can make you feel weak and cause you to get dehydrated. Dehydration can make you tired and thirsty, make you have a dry mouth, and make it so you pee (urinate) less often. Older adults and people with other diseases or a weak defense system (immune system) are at higher risk for dehydration. It is important to replace the fluids that you lose from having watery poop and throwing up. Follow these instructions at home: Follow instructions from your  doctor about how to care for yourself at home. Eating and drinking   Follow these instructions as told by your doctor:  Take an oral rehydration solution (ORS). This is a drink that is sold at pharmacies and stores.  Drink clear fluids in small amounts as you are able, such as:  Water.  Ice chips.  Diluted fruit juice.  Low-calorie sports  drinks.  Eat bland, easy-to-digest foods in small amounts as you are able, such as:  Bananas.  Applesauce.  Rice.  Low-fat (lean) meats.  Toast.  Crackers.  Avoid fluids that have a lot of sugar or caffeine in them.  Avoid alcohol.  Avoid spicy or fatty foods. General instructions   Drink enough fluid to keep your pee (urine) clear or pale yellow.  Wash your hands often. If you cannot use soap and water, use hand sanitizer.  Make sure that all people in your home wash their hands well and often.  Rest at home while you get better.  Take over-the-counter and prescription medicines only as told by your doctor.  Watch your condition for any changes.  Take a warm bath to help with any burning or pain from having watery poop.  Keep all follow-up visits as told by your doctor. This is important. Contact a doctor if:  You cannot keep fluids down.  Your symptoms get worse.  You have new symptoms.  You feel light-headed or dizzy.  You have muscle cramps. Get help right away if:  You have chest pain.  You feel very weak or you pass out (faint).  You see blood in your throw-up.  Your throw-up looks like coffee grounds.  You have bloody or black poop (stools) or poop that look like tar.  You have a very bad headache, a stiff neck, or both.  You have a rash.  You have very bad pain, cramping, or bloating in your belly (abdomen).  You have trouble breathing.  You are breathing very quickly.  Your heart is beating very quickly.  Your skin feels cold and clammy.  You feel confused.  You have pain when you pee.  You have signs of dehydration, such as:  Dark pee, hardly any pee, or no pee.  Cracked lips.  Dry mouth.  Sunken eyes.  Sleepiness.  Weakness. This information is not intended to replace advice given to you by your health care provider. Make sure you discuss any questions you have with your health care provider. Document Released:  12/17/2007 Document Revised: 01/18/2016 Document Reviewed: 03/06/2015 Elsevier Interactive Patient Education  2017 [employer].

## 2016-11-13 DIAGNOSIS — R197 Diarrhea, unspecified: Secondary | ICD-10-CM | POA: Diagnosis not present

## 2016-11-13 DIAGNOSIS — K529 Noninfective gastroenteritis and colitis, unspecified: Secondary | ICD-10-CM | POA: Diagnosis not present

## 2016-11-13 LAB — CBC WITH DIFFERENTIAL/PLATELET
BASOS ABS: 0 10*3/uL (ref 0.0–0.2)
Basos: 0 %
EOS (ABSOLUTE): 0.1 10*3/uL (ref 0.0–0.4)
Eos: 1 %
Hematocrit: 29.2 % — ABNORMAL LOW (ref 34.0–46.6)
Hemoglobin: 10.4 g/dL — ABNORMAL LOW (ref 11.1–15.9)
Immature Grans (Abs): 0 10*3/uL (ref 0.0–0.1)
Immature Granulocytes: 0 %
LYMPHS ABS: 1 10*3/uL (ref 0.7–3.1)
Lymphs: 17 %
MCH: 36.9 pg — ABNORMAL HIGH (ref 26.6–33.0)
MCHC: 35.6 g/dL (ref 31.5–35.7)
MCV: 104 fL — ABNORMAL HIGH (ref 79–97)
MONOS ABS: 0.9 10*3/uL (ref 0.1–0.9)
Monocytes: 15 %
NEUTROS ABS: 3.8 10*3/uL (ref 1.4–7.0)
Neutrophils: 67 %
Platelets: 624 10*3/uL — ABNORMAL HIGH (ref 150–379)
RBC: 2.82 x10E6/uL — AB (ref 3.77–5.28)
RDW: 14.8 % (ref 12.3–15.4)
WBC: 5.7 10*3/uL (ref 3.4–10.8)

## 2016-11-13 LAB — CMP AND LIVER
ALBUMIN: 3.6 g/dL (ref 3.5–4.8)
ALK PHOS: 139 IU/L — AB (ref 39–117)
ALT: 22 IU/L (ref 0–32)
AST: 29 IU/L (ref 0–40)
BILIRUBIN TOTAL: 0.5 mg/dL (ref 0.0–1.2)
BILIRUBIN, DIRECT: 0.24 mg/dL (ref 0.00–0.40)
BUN: 21 mg/dL (ref 8–27)
CO2: 21 mmol/L (ref 18–29)
Calcium: 9.3 mg/dL (ref 8.7–10.3)
Chloride: 97 mmol/L (ref 96–106)
Creatinine, Ser: 1.53 mg/dL — ABNORMAL HIGH (ref 0.57–1.00)
GFR calc Af Amer: 39 mL/min/{1.73_m2} — ABNORMAL LOW (ref >59)
GFR calc non Af Amer: 34 mL/min/{1.73_m2} — ABNORMAL LOW (ref >59)
Glucose: 108 mg/dL — ABNORMAL HIGH (ref 65–99)
POTASSIUM: 3.7 mmol/L (ref 3.5–5.2)
SODIUM: 139 mmol/L (ref 134–144)
TOTAL PROTEIN: 7.5 g/dL (ref 6.0–8.5)

## 2016-11-13 NOTE — Addendum Note (Signed)
Addended by: Octaviano Glow on: 11/13/2016 02:23 PM   Modules accepted: Orders

## 2016-11-14 LAB — URINALYSIS
Bilirubin, UA: NEGATIVE
GLUCOSE, UA: NEGATIVE
Ketones, UA: NEGATIVE
LEUKOCYTES UA: NEGATIVE
Nitrite, UA: NEGATIVE
PROTEIN UA: NEGATIVE
RBC, UA: NEGATIVE
Specific Gravity, UA: 1.009 (ref 1.005–1.030)
Urobilinogen, Ur: 0.2 mg/dL (ref 0.2–1.0)
pH, UA: 5.5 (ref 5.0–7.5)

## 2016-11-19 ENCOUNTER — Encounter: Payer: Self-pay | Admitting: Internal Medicine

## 2016-11-19 ENCOUNTER — Ambulatory Visit: Payer: Medicare HMO | Attending: Internal Medicine | Admitting: Internal Medicine

## 2016-11-19 VITALS — BP 123/68 | HR 107 | Temp 98.2°F | Resp 16 | Wt 133.2 lb

## 2016-11-19 DIAGNOSIS — R197 Diarrhea, unspecified: Secondary | ICD-10-CM

## 2016-11-19 DIAGNOSIS — N179 Acute kidney failure, unspecified: Secondary | ICD-10-CM | POA: Insufficient documentation

## 2016-11-19 DIAGNOSIS — F419 Anxiety disorder, unspecified: Secondary | ICD-10-CM | POA: Insufficient documentation

## 2016-11-19 DIAGNOSIS — Z8673 Personal history of transient ischemic attack (TIA), and cerebral infarction without residual deficits: Secondary | ICD-10-CM | POA: Insufficient documentation

## 2016-11-19 DIAGNOSIS — D539 Nutritional anemia, unspecified: Secondary | ICD-10-CM | POA: Diagnosis not present

## 2016-11-19 DIAGNOSIS — F329 Major depressive disorder, single episode, unspecified: Secondary | ICD-10-CM | POA: Diagnosis not present

## 2016-11-19 DIAGNOSIS — Z7982 Long term (current) use of aspirin: Secondary | ICD-10-CM | POA: Diagnosis not present

## 2016-11-19 DIAGNOSIS — I10 Essential (primary) hypertension: Secondary | ICD-10-CM | POA: Insufficient documentation

## 2016-11-19 DIAGNOSIS — Z79899 Other long term (current) drug therapy: Secondary | ICD-10-CM | POA: Diagnosis not present

## 2016-11-19 DIAGNOSIS — M109 Gout, unspecified: Secondary | ICD-10-CM | POA: Insufficient documentation

## 2016-11-19 NOTE — Progress Notes (Signed)
Tina Waller, is a 76 y.o. female  EZM:[id number]  [id number]  DOB - [birth year]  Chief Complaint  Patient presents with  . Follow-up        Subjective:   Tina Waller is a 76 y.o. female here today for a follow up visit for diarrhea, last seen 11/12/16. Pt is doing much better, all sxs resolved. Never required phenergan. Has been eating Activia yogurt bid which has helped. Still on bland diet, worried about adding fried foods bad.     Patient has No headache, No chest pain, No abdominal pain - No Nausea, No new weakness tingling or numbness, No Cough - SOB.  No problems updated.  ALLERGIES: No Known Allergies  PAST MEDICAL HISTORY: Past Medical History:  Diagnosis Date  . Anemia   . Anxiety   . Cataract   . Depression   . Diabetes mellitus without complication ([place])    pt denies  . Gout   . Hypertension   . Stroke [facility]) 2008    MEDICATIONS AT HOME: Prior to Admission medications   Medication Sig Start Date End Date Taking? Authorizing Provider  allopurinol (ZYLOPRIM) 100 MG tablet Take 1 tablet (100 mg total) by mouth daily. 04/21/16   Taygen Acklin, Leda Quail, MD  amLODipine (NORVASC) 10 MG tablet Take 1 tablet (10 mg total) by mouth daily. 04/21/16   Maren Reamer, MD  aspirin EC 81 MG tablet Take 81 mg by mouth daily.    [provider]  atorvastatin (LIPITOR) 10 MG tablet Take 1 tablet (10 mg total) by mouth daily. For cholesterol Patient not taking: Reported on 09/25/2016 05/31/15   Lance Bosch, NP  losartan-hydrochlorothiazide (HYZAAR) 100-25 MG tablet Take 1 tablet by mouth daily. 04/21/16   Maren Reamer, MD  metoprolol succinate (TOPROL-XL) 25 MG 24 hr tablet Take 1 tablet (25 mg total) by mouth daily. 04/21/16   Maren Reamer, MD  promethazine (PHENERGAN) 12.5 MG tablet Take 1 tablet (12.5 mg total) by mouth every 6 (six) hours as needed for nausea or vomiting. 11/12/16   Maren Reamer, MD  saccharomyces boulardii (FLORASTOR) 250  MG capsule Take 1 capsule (250 mg total) by mouth 2 (two) times daily. 11/12/16   Maren Reamer, MD  sertraline (ZOLOFT) 50 MG tablet Take 1 tablet (50 mg total) by mouth at bedtime. Patient not taking: Reported on 11/12/2016 05/12/16   Lottie Mussel T, MD  triamcinolone cream (KENALOG) 0.1 % Apply 1 application topically 2 (two) times daily. Patient not taking: Reported on 07/18/2016 05/14/15   Lance Bosch, NP  Vitamin D, Ergocalciferol, (DRISDOL) 50000 units CAPS capsule Take 1 capsule (50,000 Units total) by mouth every 7 (seven) days. Patient not taking: Reported on 11/12/2016 04/23/16   Lottie Mussel T, MD     Objective:   Vitals:   11/19/16 1025  BP: 123/68  Pulse: (!) 107  Resp: 16  Temp: 98.2 F (36.8 C)  TempSrc: Oral  SpO2: 95%  Weight: 133 lb 3.2 oz (60.4 kg)    Exam General appearance : Awake, alert, not in any distress. Speech Clear. Not toxic looking, pleasant. HEENT: Atraumatic and Normocephalic,  Neck: supple, no JVD. Chest:Good air entry bilaterally, no added sounds. CVS: S1 S2 regular, no murmurs/gallups or rubs. Abdomen: Bowel sounds active, Non tender and not distended with no gaurding, rigidity or rebound. Extremities: B/L Lower Ext shows no edema, both legs are warm to touch Neurology: Awake alert, and oriented X 3, CN II-XII grossly  intact, Non focal Skin:No Rash  Data Review Lab Results  Component Value Date   HGBA1C 5.0 04/21/2016    Depression screen [facility] 2/9 05/12/2016 04/21/2016 03/28/2015  Decreased Interest 0 2 1  Down, Depressed, Hopeless 0 2 1  PHQ - 2 Score 0 4 2  Altered sleeping 0 3 3  Tired, decreased energy 0 2 1  Change in appetite 1 3 3   Feeling bad or failure about yourself  0 1 2  Trouble concentrating 0 0 0  Moving slowly or fidgety/restless 0 0 0  Suicidal thoughts - 2 1  PHQ-9 Score 1 15 12       Assessment & Plan   1. Macrocytic anemia - denies any bleeding. - Iron and TIBC - Ferritin - Folate - Vitamin  B12  2. AKI (acute kidney injury) ([place]) Likely from recent diarrhea/decrease po, will rechk for resolution. - Basic metabolic panel  3. Diarrhea, unspecified type Resolved. - encouraged to slowly introduce her normal foods back, caution on fried foods, baking may be better at first.      Patient have been counseled extensively about nutrition and exercise  Return in about 3 months (around 02/19/2017), or if symptoms worsen or fail to improve.  The patient was given clear instructions to go to ER or return to medical center if symptoms don't improve, worsen or new problems develop. The patient verbalized understanding. The patient was told to call to get lab results if they haven't heard anything in the next week.   This note has been created with Surveyor, quantity. Any transcriptional errors are unintentional.   Maren Reamer, MD, [facility] [address], [place]   11/19/2016, 10:35 AM

## 2016-11-19 NOTE — Patient Instructions (Signed)
Health Maintenance for Postmenopausal Women Menopause is a normal process in which your reproductive ability comes to an end. This process happens gradually over a span of months to years, usually between the ages of 33 and 38. Menopause is complete when you have missed 12 consecutive menstrual periods. It is important to talk with your health care provider about some of the most common conditions that affect postmenopausal women, such as heart disease, cancer, and bone loss (osteoporosis). Adopting a healthy lifestyle and getting preventive care can help to promote your health and wellness. Those actions can also lower your chances of developing some of these common conditions. What should I know about menopause? During menopause, you may experience a number of symptoms, such as:  Moderate-to-severe hot flashes.  Night sweats.  Decrease in sex drive.  Mood swings.  Headaches.  Tiredness.  Irritability.  Memory problems.  Insomnia. Choosing to treat or not to treat menopausal changes is an individual decision that you make with your health care provider. What should I know about hormone replacement therapy and supplements? Hormone therapy products are effective for treating symptoms that are associated with menopause, such as hot flashes and night sweats. Hormone replacement carries certain risks, especially as you become older. If you are thinking about using estrogen or estrogen with progestin treatments, discuss the benefits and risks with your health care provider. What should I know about heart disease and stroke? Heart disease, heart attack, and stroke become more likely as you age. This may be due, in part, to the hormonal changes that your body experiences during menopause. These can affect how your body processes dietary fats, triglycerides, and cholesterol. Heart attack and stroke are both medical emergencies. There are many things that you can do to help prevent heart disease  and stroke:  Have your blood pressure checked at least every 1-2 years. High blood pressure causes heart disease and increases the risk of stroke.  If you are 48-61 years old, ask your health care provider if you should take aspirin to prevent a heart attack or a stroke.  Do not use any tobacco products, including cigarettes, chewing tobacco, or electronic cigarettes. If you need help quitting, ask your health care provider.  It is important to eat a healthy diet and maintain a healthy weight.  Be sure to include plenty of vegetables, fruits, low-fat dairy products, and lean protein.  Avoid eating foods that are high in solid fats, added sugars, or salt (sodium).  Get regular exercise. This is one of the most important things that you can do for your health.  Try to exercise for at least 150 minutes each week. The type of exercise that you do should increase your heart rate and make you sweat. This is known as moderate-intensity exercise.  Try to do strengthening exercises at least twice each week. Do these in addition to the moderate-intensity exercise.  Know your numbers.Ask your health care provider to check your cholesterol and your blood glucose. Continue to have your blood tested as directed by your health care provider. What should I know about cancer screening? There are several types of cancer. Take the following steps to reduce your risk and to catch any cancer development as early as possible. Breast Cancer  Practice breast self-awareness.  This means understanding how your breasts normally appear and feel.  It also means doing regular breast self-exams. Let your health care provider know about any changes, no matter how small.  If you are 40 or older,  have a clinician do a breast exam (clinical breast exam or CBE) every year. Depending on your age, family history, and medical history, it may be recommended that you also have a yearly breast X-ray (mammogram).  If you  have a family history of breast cancer, talk with your health care provider about genetic screening.  If you are at high risk for breast cancer, talk with your health care provider about having an MRI and a mammogram every year.  Breast cancer (BRCA) gene test is recommended for women who have family members with BRCA-related cancers. Results of the assessment will determine the need for genetic counseling and BRCA1 and for BRCA2 testing. BRCA-related cancers include these types:  Breast. This occurs in males or females.  Ovarian.  Tubal. This may also be called fallopian tube cancer.  Cancer of the abdominal or pelvic lining (peritoneal cancer).  Prostate.  Pancreatic. Cervical, Uterine, and Ovarian Cancer  Your health care provider may recommend that you be screened regularly for cancer of the pelvic organs. These include your ovaries, uterus, and vagina. This screening involves a pelvic exam, which includes checking for microscopic changes to the surface of your cervix (Pap test).  For women ages 21-65, health care providers may recommend a pelvic exam and a Pap test every three years. For women ages 23-65, they may recommend the Pap test and pelvic exam, combined with testing for human papilloma virus (HPV), every five years. Some types of HPV increase your risk of cervical cancer. Testing for HPV may also be done on women of any age who have unclear Pap test results.  Other health care providers may not recommend any screening for nonpregnant women who are considered low risk for pelvic cancer and have no symptoms. Ask your health care provider if a screening pelvic exam is right for you.  If you have had past treatment for cervical cancer or a condition that could lead to cancer, you need Pap tests and screening for cancer for at least 20 years after your treatment. If Pap tests have been discontinued for you, your risk factors (such as having a new sexual partner) need to be reassessed  to determine if you should start having screenings again. Some women have medical problems that increase the chance of getting cervical cancer. In these cases, your health care provider may recommend that you have screening and Pap tests more often.  If you have a family history of uterine cancer or ovarian cancer, talk with your health care provider about genetic screening.  If you have vaginal bleeding after reaching menopause, tell your health care provider.  There are currently no reliable tests available to screen for ovarian cancer. Lung Cancer  Lung cancer screening is recommended for adults 99-83 years old who are at high risk for lung cancer because of a history of smoking. A yearly low-dose CT scan of the lungs is recommended if you:  Currently smoke.  Have a history of at least 30 pack-years of smoking and you currently smoke or have quit within the past 15 years. A pack-year is smoking an average of one pack of cigarettes per day for one year. Yearly screening should:  Continue until it has been 15 years since you quit.  Stop if you develop a health problem that would prevent you from having lung cancer treatment. Colorectal Cancer  This type of cancer can be detected and can often be prevented.  Routine colorectal cancer screening usually begins at age 72 and continues  through age 75.  If you have risk factors for colon cancer, your health care provider may recommend that you be screened at an earlier age.  If you have a family history of colorectal cancer, talk with your health care provider about genetic screening.  Your health care provider may also recommend using home test kits to check for hidden blood in your stool.  A small camera at the end of a tube can be used to examine your colon directly (sigmoidoscopy or colonoscopy). This is done to check for the earliest forms of colorectal cancer.  Direct examination of the colon should be repeated every 5-10 years until  age 75. However, if early forms of precancerous polyps or small growths are found or if you have a family history or genetic risk for colorectal cancer, you may need to be screened more often. Skin Cancer  Check your skin from head to toe regularly.  Monitor any moles. Be sure to tell your health care provider:  About any new moles or changes in moles, especially if there is a change in a mole's shape or color.  If you have a mole that is larger than the size of a pencil eraser.  If any of your family members has a history of skin cancer, especially at a young age, talk with your health care provider about genetic screening.  Always use sunscreen. Apply sunscreen liberally and repeatedly throughout the day.  Whenever you are outside, protect yourself by wearing long sleeves, pants, a wide-brimmed hat, and sunglasses. What should I know about osteoporosis? Osteoporosis is a condition in which bone destruction happens more quickly than new bone creation. After menopause, you may be at an increased risk for osteoporosis. To help prevent osteoporosis or the bone fractures that can happen because of osteoporosis, the following is recommended:  If you are 19-50 years old, get at least 1,000 mg of calcium and at least 600 mg of vitamin D per day.  If you are older than age 50 but younger than age 70, get at least 1,200 mg of calcium and at least 600 mg of vitamin D per day.  If you are older than age 70, get at least 1,200 mg of calcium and at least 800 mg of vitamin D per day. Smoking and excessive alcohol intake increase the risk of osteoporosis. Eat foods that are rich in calcium and vitamin D, and do weight-bearing exercises several times each week as directed by your health care provider. What should I know about how menopause affects my mental health? Depression may occur at any age, but it is more common as you become older. Common symptoms of depression include:  Low or sad  mood.  Changes in sleep patterns.  Changes in appetite or eating patterns.  Feeling an overall lack of motivation or enjoyment of activities that you previously enjoyed.  Frequent crying spells. Talk with your health care provider if you think that you are experiencing depression. What should I know about immunizations? It is important that you get and maintain your immunizations. These include:  Tetanus, diphtheria, and pertussis (Tdap) booster vaccine.  Influenza every year before the flu season begins.  Pneumonia vaccine.  Shingles vaccine. Your health care provider may also recommend other immunizations. This information is not intended to replace advice given to you by your health care provider. Make sure you discuss any questions you have with your health care provider. Document Released: 08/22/2005 Document Revised: 01/18/2016 Document Reviewed: 04/03/2015 Elsevier Interactive Patient   Education  2017 [employer]   - Anemia, Nonspecific Anemia is a condition in which the concentration of red blood cells or hemoglobin in the blood is below normal. Hemoglobin is a substance in red blood cells that carries oxygen to the tissues of the body. Anemia results in not enough oxygen reaching these tissues. What are the causes? Common causes of anemia include:  Excessive bleeding. Bleeding may be internal or external. This includes excessive bleeding from periods (in women) or from the intestine.  Poor nutrition.  Chronic kidney, thyroid, and liver disease.  Bone marrow disorders that decrease red blood cell production.  Cancer and treatments for cancer.  HIV, AIDS, and their treatments.  Spleen problems that increase red blood cell destruction.  Blood disorders.  Excess destruction of red blood cells due to infection, medicines, and autoimmune disorders. What are the signs or symptoms?  Minor weakness.  Dizziness.  Headache.  Palpitations.  Shortness of  breath, especially with exercise.  Paleness.  Cold sensitivity.  Indigestion.  Nausea.  Difficulty sleeping.  Difficulty concentrating. Symptoms may occur suddenly or they may develop slowly. How is this diagnosed? Additional blood tests are often needed. These help your health care provider determine the best treatment. Your health care provider will check your stool for blood and look for other causes of blood loss. How is this treated? Treatment varies depending on the cause of the anemia. Treatment can include:  Supplements of iron, vitamin P10, or folic acid.  Hormone medicines.  A blood transfusion. This may be needed if blood loss is severe.  Hospitalization. This may be needed if there is significant continual blood loss.  Dietary changes.  Spleen removal. Follow these instructions at home: Keep all follow-up appointments. It often takes many weeks to correct anemia, and having your health care provider check on your condition and your response to treatment is very important. Get help right away if:  You develop extreme weakness, shortness of breath, or chest pain.  You become dizzy or have trouble concentrating.  You develop heavy vaginal bleeding.  You develop a rash.  You have bloody or black, tarry stools.  You faint.  You vomit up blood.  You vomit repeatedly.  You have abdominal pain.  You have a fever or persistent symptoms for more than 2-3 days.  You have a fever and your symptoms suddenly get worse.  You are dehydrated. This information is not intended to replace advice given to you by your health care provider. Make sure you discuss any questions you have with your health care provider. Document Released: 08/07/2004 Document Revised: 12/12/2015 Document Reviewed: 12/24/2012 Elsevier Interactive Patient Education  2017 [employer].

## 2016-11-20 LAB — BASIC METABOLIC PANEL
BUN/Creatinine Ratio: 16 (ref 12–28)
BUN: 27 mg/dL (ref 8–27)
CALCIUM: 9.5 mg/dL (ref 8.7–10.3)
CHLORIDE: 97 mmol/L (ref 96–106)
CO2: 22 mmol/L (ref 18–29)
Creatinine, Ser: 1.69 mg/dL — ABNORMAL HIGH (ref 0.57–1.00)
GFR calc Af Amer: 34 mL/min/{1.73_m2} — ABNORMAL LOW (ref >59)
GFR calc non Af Amer: 30 mL/min/{1.73_m2} — ABNORMAL LOW (ref >59)
GLUCOSE: 100 mg/dL — AB (ref 65–99)
Potassium: 4.7 mmol/L (ref 3.5–5.2)
Sodium: 140 mmol/L (ref 134–144)

## 2016-11-20 LAB — FOLATE: Folate: 8.6 ng/mL (ref >3.0)

## 2016-11-20 LAB — FERRITIN: Ferritin: 554 ng/mL — ABNORMAL HIGH (ref 15–150)

## 2016-11-20 LAB — IRON AND TIBC
IRON: 56 ug/dL (ref 27–139)
Iron Saturation: 32 % (ref 15–55)
TIBC: 176 ug/dL — AB (ref 250–450)
UIBC: 120 ug/dL (ref 118–369)

## 2016-11-20 LAB — VITAMIN B12: Vitamin B-12: 531 pg/mL (ref 232–1245)

## 2016-11-24 ENCOUNTER — Telehealth: Payer: Self-pay

## 2016-11-24 ENCOUNTER — Other Ambulatory Visit: Payer: Self-pay | Admitting: Internal Medicine

## 2016-11-24 DIAGNOSIS — N183 Chronic kidney disease, stage 3 unspecified: Secondary | ICD-10-CM

## 2016-11-24 NOTE — Telephone Encounter (Signed)
Contacted pt to go over lab results pt is aware of results and pt is aware of results and doesn't have any questions or concerns

## 2016-11-27 ENCOUNTER — Encounter: Payer: Self-pay | Admitting: Internal Medicine

## 2016-11-28 ENCOUNTER — Encounter: Payer: Self-pay | Admitting: Internal Medicine

## 2016-12-01 ENCOUNTER — Encounter: Payer: Self-pay | Admitting: Internal Medicine

## 2016-12-09 ENCOUNTER — Ambulatory Visit: Payer: Medicare HMO | Attending: Internal Medicine | Admitting: *Deleted

## 2016-12-09 ENCOUNTER — Ambulatory Visit: Payer: Medicare HMO

## 2016-12-09 VITALS — BP 128/68 | HR 84 | Wt 134.2 lb

## 2016-12-09 DIAGNOSIS — R600 Localized edema: Secondary | ICD-10-CM | POA: Diagnosis not present

## 2016-12-09 DIAGNOSIS — N183 Chronic kidney disease, stage 3 unspecified: Secondary | ICD-10-CM

## 2016-12-09 DIAGNOSIS — Z0130 Encounter for examination of blood pressure without abnormal findings: Secondary | ICD-10-CM

## 2016-12-09 NOTE — Progress Notes (Signed)
Pt arrived to [facility], alert and oriented and arrives in good spirits. Last OV  05/[id number]  with Dr. Janne Napoleon.  Pt denies chest pain, SOB, HA, dizziness, or blurred vision.  Verified medication with patient.  Notable pedal edema assessed in sandals when patient walked from lobby to room. Palpable pedal pulse. +1pitting edema in right leg. She states she had  swelling prior to taking Amlodipine.Pt states she does not wear compression hose that are prescribed due to difficulty putting them on and states "they are hot" .She also states she normally sits with her feet down and does not elevate them.  Education provided on fall risk d/t edema. Encouraged patient to wear compression stockings and elevate feet.  Manual blood pressure reading: 120/70 and 128/68.  Pt has appointment with Dr. Wynetta Emery on 12/22/16.

## 2016-12-09 NOTE — Progress Notes (Signed)
Patient here for lab visit only 

## 2016-12-10 LAB — BASIC METABOLIC PANEL
BUN/Creatinine Ratio: 16 (ref 12–28)
BUN: 19 mg/dL (ref 8–27)
CHLORIDE: 102 mmol/L (ref 96–106)
CO2: 21 mmol/L (ref 18–29)
CREATININE: 1.22 mg/dL — AB (ref 0.57–1.00)
Calcium: 9.6 mg/dL (ref 8.7–10.3)
GFR calc Af Amer: 51 mL/min/{1.73_m2} — ABNORMAL LOW (ref >59)
GFR calc non Af Amer: 44 mL/min/{1.73_m2} — ABNORMAL LOW (ref >59)
GLUCOSE: 98 mg/dL (ref 65–99)
POTASSIUM: 4.2 mmol/L (ref 3.5–5.2)
SODIUM: 140 mmol/L (ref 134–144)

## 2016-12-15 ENCOUNTER — Telehealth: Payer: Self-pay

## 2016-12-15 NOTE — Telephone Encounter (Signed)
Contacted pt to go over lab results pt is aware of results and doesn't have any questions or concerns 

## 2016-12-22 ENCOUNTER — Encounter: Payer: Self-pay | Admitting: Internal Medicine

## 2016-12-22 ENCOUNTER — Ambulatory Visit: Payer: Medicare HMO | Attending: Internal Medicine | Admitting: Internal Medicine

## 2016-12-22 VITALS — BP 120/76 | HR 110 | Temp 97.7°F | Resp 16 | Wt 135.2 lb

## 2016-12-22 DIAGNOSIS — Z79899 Other long term (current) drug therapy: Secondary | ICD-10-CM | POA: Insufficient documentation

## 2016-12-22 DIAGNOSIS — E785 Hyperlipidemia, unspecified: Secondary | ICD-10-CM | POA: Diagnosis not present

## 2016-12-22 DIAGNOSIS — I10 Essential (primary) hypertension: Secondary | ICD-10-CM | POA: Insufficient documentation

## 2016-12-22 DIAGNOSIS — Z8673 Personal history of transient ischemic attack (TIA), and cerebral infarction without residual deficits: Secondary | ICD-10-CM | POA: Insufficient documentation

## 2016-12-22 DIAGNOSIS — Z8739 Personal history of other diseases of the musculoskeletal system and connective tissue: Secondary | ICD-10-CM | POA: Insufficient documentation

## 2016-12-22 DIAGNOSIS — Z7982 Long term (current) use of aspirin: Secondary | ICD-10-CM | POA: Diagnosis not present

## 2016-12-22 DIAGNOSIS — Z87891 Personal history of nicotine dependence: Secondary | ICD-10-CM | POA: Insufficient documentation

## 2016-12-22 DIAGNOSIS — F329 Major depressive disorder, single episode, unspecified: Secondary | ICD-10-CM | POA: Diagnosis not present

## 2016-12-22 DIAGNOSIS — M810 Age-related osteoporosis without current pathological fracture: Secondary | ICD-10-CM | POA: Diagnosis not present

## 2016-12-22 DIAGNOSIS — R600 Localized edema: Secondary | ICD-10-CM | POA: Diagnosis not present

## 2016-12-22 DIAGNOSIS — E559 Vitamin D deficiency, unspecified: Secondary | ICD-10-CM | POA: Insufficient documentation

## 2016-12-22 DIAGNOSIS — M109 Gout, unspecified: Secondary | ICD-10-CM | POA: Diagnosis not present

## 2016-12-22 MED ORDER — LISINOPRIL 5 MG PO TABS: 5.0000 mg | ORAL_TABLET | Freq: Every day | ORAL | 3 refills | Status: DC

## 2016-12-22 NOTE — Patient Instructions (Signed)
Stop Norvasc for now. Start Lisinopril 5 mg daily.    Purchase Calcium 600 mg over the counter and take one tablet twice a day.

## 2016-12-22 NOTE — Progress Notes (Signed)
Patient ID: MONSERRATE BLASCHKE, female    DOB: [birth year]  MRN: [id number]  CC: Foot Swelling (b/l)  Subjective: Tina Waller is a 76 y.o. female who presents for f/u.  Her concerns today include:  Patient with history of hypertension, hyperlipidemia, vitamin D deficiency, osteoporosis on Dexa done fall 2017, depression, CVA in 2008 and gout.  1.  HL: She stopped Lipitor because it was causing diarrhea  2.  Osteoporosis:  Taking Vit D 5000 units once a wk -plan was to start Fosamax once VitD replaced.   -not taking Calcium -no back or hip pains  -+ stiffness with prolong sitting that is better when she gets moving -gait a little unsteady at times when she first stands since CVA 2008.  Has cane and walker at home which she uses inconsistently. -fell 1 yr ago when foot got tangled in a throw rug, she has since removed that route -Patient lives alone in an apartment complex. He is independent in ADLs and still drives  3.  HTN:   Compliant with Norvasc and Toprol -endorses swelling in legs resolved but still has  It in feet which she states is chronic. Wears compression stockings around the house but prefers not to be bothered with them when going out in public -no CP/SOB/limits salt    Patient Active Problem List   Diagnosis Date Noted  . Osteoporosis 12/22/2016  . Hx of gout 12/22/2016  . Estrogen deficiency 05/12/2016  . Vitamin D deficiency 05/12/2016  . Weight loss 05/12/2016  . HTN (hypertension) 06/12/2015  . History of CVA (cerebrovascular accident) 03/28/2015     Current Outpatient Prescriptions on File Prior to Visit  Medication Sig Dispense Refill  . allopurinol (ZYLOPRIM) 100 MG tablet Take 1 tablet (100 mg total) by mouth daily. 90 tablet 3  . aspirin EC 81 MG tablet Take 81 mg by mouth daily.    . metoprolol succinate (TOPROL-XL) 25 MG 24 hr tablet Take 1 tablet (25 mg total) by mouth daily. 90 tablet 3  . saccharomyces boulardii (FLORASTOR) 250 MG capsule  Take 1 capsule (250 mg total) by mouth 2 (two) times daily. 30 capsule 1  . sertraline (ZOLOFT) 50 MG tablet Take 1 tablet (50 mg total) by mouth at bedtime. (Patient not taking: Reported on 12/22/2016) 90 tablet 2  . triamcinolone cream (KENALOG) 0.1 % Apply 1 application topically 2 (two) times daily. (Patient not taking: Reported on 12/22/2016) 80 g 1  . Vitamin D, Ergocalciferol, (DRISDOL) 50000 units CAPS capsule Take 1 capsule (50,000 Units total) by mouth every 7 (seven) days. (Patient not taking: Reported on 12/22/2016) 12 capsule 0   Current Facility-Administered Medications on File Prior to Visit  Medication Dose Route Frequency Provider Last Rate Last Dose  . 0.9 %  sodium chloride infusion  500 mL Intravenous Continuous Doran Stabler, MD        Allergies  Allergen Reactions  . Lipitor [Atorvastatin]     Social History   Social History  . Marital status: Divorced    Spouse name: N/A  . Number of children: N/A  . Years of education: N/A   Occupational History  . Not on file.   Social History Main Topics  . Smoking status: Former Smoker    Quit date: 07/14/2006  . Smokeless tobacco: Never Used  . Alcohol use 4.2 oz/week    7 Shots of liquor per week     Comment: wine and vodka  . Drug use: No  .  Sexual activity: Not on file   Other Topics Concern  . Not on file   Social History Narrative  . No narrative on file    Family History  Problem Relation Age of Onset  . Heart disease Mother     Past Surgical History:  Procedure Laterality Date  . ABDOMINAL HYSTERECTOMY    . CATARACT EXTRACTION Left     ROS: Review of Systems As stated above  PHYSICAL EXAM: BP 120/76   Pulse (!) 110   Temp 97.7 F (36.5 C) (Oral)   Resp 16   Wt 135 lb 3.2 oz (61.3 kg)   SpO2 96%   BMI 19.97 kg/m   Wt Readings from Last 3 Encounters:  12/22/16 135 lb 3.2 oz (61.3 kg)  12/09/16 134 lb 3.2 oz (60.9 kg)  11/19/16 133 lb 3.2 oz (60.4 kg)   Physical Exam General  appearance - alert, well appearing, pleasant elderly African-American female and in no distress Mental status - alert, oriented to person, place, and time, normal mood, behavior, speech, dress, motor activity, and thought processes Mouth - mucous membranes moist, pharynx normal without lesions, partials above. Several cavities and lower jaw Neck - supple, no significant adenopathy Chest - clear to auscultation, no wheezes, rales or rhonchi, symmetric air entry Heart - normal rate, regular rhythm, normal S1, S2, no murmurs, rubs, clicks or gallops Extremities -2+ edema in feet mild hyperpigmented changes around the ankles especially the lateral aspect   Depression screen PHQ 2/9 12/22/2016  Decreased Interest 1  Down, Depressed, Hopeless 1  PHQ - 2 Score 2  Altered sleeping 2  Tired, decreased energy 1  Change in appetite 1  Feeling bad or failure about yourself  2  Trouble concentrating 1  Moving slowly or fidgety/restless 1  Suicidal thoughts 0  PHQ-9 Score 10   GAD 7 : Generalized Anxiety Score 12/22/2016 05/12/2016 04/21/2016  Nervous, Anxious, on Edge 1 0 2  Control/stop worrying 0 0 1  Worry too much - different things 0 0 2  Trouble relaxing 1 0 2  Restless 0 0 1  Easily annoyed or irritable 1 0 2  Afraid - awful might happen 1 0 2  Total GAD 7 Score 4 0 12   ASSESSMENT AND PLAN: 1. Essential hypertension Blood pressure at goal. Swelling in her feet likely due to stasis changes but Norvasc and also cause edema. We discussed giving a trial of stopping the Norvasc and bringing her back in 2 weeks to see whether the swelling resolves. In the meantime we will put her on a low dose of lisinopril and check creatinine on follow-up visit - lisinopril (PRINIVIL,ZESTRIL) 5 MG tablet; Take 1 tablet (5 mg total) by mouth daily.  Dispense: 30 tablet; Refill: 3 - Basic metabolic panel; Future  2. Edema of both feet -See plan above  3. Age related osteoporosis, unspecified pathological  fracture presence We will check vitamin D level on her follow-up visit in 2 weeks. If levels have normalized and kidney function is stable we will consider starting Fosamax  4. Vitamin D deficiency - VITAMIN D 25 Hydroxy (Vit-D Deficiency, Fractures); Future  5. Hyperlipidemia, unspecified hyperlipidemia type -Can consider trying her with a different statin on follow-up visit.  6. Hx of gout No recent flares   Patient was given the opportunity to ask questions.  Patient verbalized understanding of the plan and was able to repeat key elements of the plan.   Orders Placed This Encounter  Procedures  .  Basic metabolic panel  . VITAMIN D 25 Hydroxy (Vit-D Deficiency, Fractures)     Requested Prescriptions   Signed Prescriptions Disp Refills  . lisinopril (PRINIVIL,ZESTRIL) 5 MG tablet 30 tablet 3    Sig: Take 1 tablet (5 mg total) by mouth daily.    Return in about 2 weeks (around 01/05/2017) for for repeat BP and LE edema.  Karle Plumber, MD, FACP

## 2016-12-25 ENCOUNTER — Encounter (HOSPITAL_COMMUNITY): Payer: Commercial Managed Care - HMO

## 2016-12-25 ENCOUNTER — Ambulatory Visit: Payer: Commercial Managed Care - HMO | Admitting: Family

## 2017-01-12 ENCOUNTER — Encounter: Payer: Self-pay | Admitting: Internal Medicine

## 2017-01-12 ENCOUNTER — Ambulatory Visit: Payer: Medicare HMO | Attending: Internal Medicine | Admitting: Internal Medicine

## 2017-01-12 VITALS — BP 137/89 | HR 93 | Temp 98.1°F | Resp 16 | Wt 136.6 lb

## 2017-01-12 DIAGNOSIS — N183 Chronic kidney disease, stage 3 unspecified: Secondary | ICD-10-CM

## 2017-01-12 DIAGNOSIS — I10 Essential (primary) hypertension: Secondary | ICD-10-CM

## 2017-01-12 DIAGNOSIS — M810 Age-related osteoporosis without current pathological fracture: Secondary | ICD-10-CM | POA: Diagnosis not present

## 2017-01-12 DIAGNOSIS — Z7982 Long term (current) use of aspirin: Secondary | ICD-10-CM | POA: Insufficient documentation

## 2017-01-12 DIAGNOSIS — R634 Abnormal weight loss: Secondary | ICD-10-CM | POA: Insufficient documentation

## 2017-01-12 DIAGNOSIS — Z8673 Personal history of transient ischemic attack (TIA), and cerebral infarction without residual deficits: Secondary | ICD-10-CM | POA: Diagnosis not present

## 2017-01-12 DIAGNOSIS — L309 Dermatitis, unspecified: Secondary | ICD-10-CM | POA: Diagnosis not present

## 2017-01-12 DIAGNOSIS — E559 Vitamin D deficiency, unspecified: Secondary | ICD-10-CM | POA: Insufficient documentation

## 2017-01-12 DIAGNOSIS — I129 Hypertensive chronic kidney disease with stage 1 through stage 4 chronic kidney disease, or unspecified chronic kidney disease: Secondary | ICD-10-CM | POA: Insufficient documentation

## 2017-01-12 DIAGNOSIS — R600 Localized edema: Secondary | ICD-10-CM | POA: Diagnosis not present

## 2017-01-12 DIAGNOSIS — F329 Major depressive disorder, single episode, unspecified: Secondary | ICD-10-CM | POA: Diagnosis not present

## 2017-01-12 DIAGNOSIS — R21 Rash and other nonspecific skin eruption: Secondary | ICD-10-CM | POA: Insufficient documentation

## 2017-01-12 DIAGNOSIS — Z87891 Personal history of nicotine dependence: Secondary | ICD-10-CM | POA: Diagnosis not present

## 2017-01-12 DIAGNOSIS — E785 Hyperlipidemia, unspecified: Secondary | ICD-10-CM | POA: Insufficient documentation

## 2017-01-12 DIAGNOSIS — M109 Gout, unspecified: Secondary | ICD-10-CM | POA: Insufficient documentation

## 2017-01-12 MED ORDER — AMLODIPINE BESYLATE 10 MG PO TABS: 10.0000 mg | ORAL_TABLET | Freq: Every day | ORAL | 3 refills | Status: DC

## 2017-01-12 MED ORDER — FUROSEMIDE 20 MG PO TABS: ORAL_TABLET | ORAL | 3 refills | Status: DC

## 2017-01-12 MED ORDER — TRIAMCINOLONE ACETONIDE 0.1 % EX CREA: 1.0000 "application " | TOPICAL_CREAM | Freq: Two times a day (BID) | CUTANEOUS | 1 refills | Status: DC

## 2017-01-12 NOTE — Progress Notes (Signed)
Pt states her swelling has gone down some Pt states she doesn't like the new bp medicine she is on Pt states when she takes it the medicine makes her stool watery and she doesn't like that

## 2017-01-12 NOTE — Progress Notes (Signed)
Patient ID: Tina Waller, female    DOB: 1941-06-09  MRN: [id number]  CC: Follow-up   Subjective: Tina Waller is a 76 y.o. female who presents for 3 wks f/u on LE edema and HTN Her concerns today include:  Patient with history of hypertension, hyperlipidemia, vitamin D deficiency, osteoporosis on Dexa done fall 2017, depression, CVA in 2008 and gout.  On last visit, Norvasc was d/c to see if it was contributing to LE edema. Pt started on Lisinopril instead.  Pt thinks the swelling has decreased but Lisinopril has caused loose stools. -she has pair of compression stockings but wears only in house. Too hot to wear outside  Osteoporosis -taking Ca daily OTC (she does not recall dose) and Vit D 50,000 units Q 7 days.  She has 3 pills left.   C/o rash on LT side of neck and dorsum RT hand x 2 wks. -gives hx of eczema -Denies use of perfumes or any recent change in body products.  Patient Active Problem List   Diagnosis Date Noted  . Osteoporosis 12/22/2016  . Hx of gout 12/22/2016  . Estrogen deficiency 05/12/2016  . Vitamin D deficiency 05/12/2016  . Weight loss 05/12/2016  . HTN (hypertension) 06/12/2015  . History of CVA (cerebrovascular accident) 03/28/2015     Current Outpatient Prescriptions on File Prior to Visit  Medication Sig Dispense Refill  . allopurinol (ZYLOPRIM) 100 MG tablet Take 1 tablet (100 mg total) by mouth daily. 90 tablet 3  . aspirin EC 81 MG tablet Take 81 mg by mouth daily.    . metoprolol succinate (TOPROL-XL) 25 MG 24 hr tablet Take 1 tablet (25 mg total) by mouth daily. 90 tablet 3  . saccharomyces boulardii (FLORASTOR) 250 MG capsule Take 1 capsule (250 mg total) by mouth 2 (two) times daily. 30 capsule 1  . sertraline (ZOLOFT) 50 MG tablet Take 1 tablet (50 mg total) by mouth at bedtime. (Patient not taking: Reported on 12/22/2016) 90 tablet 2  . Vitamin D, Ergocalciferol, (DRISDOL) 50000 units CAPS capsule Take 1 capsule (50,000 Units  total) by mouth every 7 (seven) days. (Patient not taking: Reported on 12/22/2016) 12 capsule 0   Current Facility-Administered Medications on File Prior to Visit  Medication Dose Route Frequency Provider Last Rate Last Dose  . 0.9 %  sodium chloride infusion  500 mL Intravenous Continuous Doran Stabler, MD        Allergies  Allergen Reactions  . Lipitor [Atorvastatin]     Social History   Social History  . Marital status: Divorced    Spouse name: N/A  . Number of children: N/A  . Years of education: N/A   Occupational History  . Not on file.   Social History Main Topics  . Smoking status: Former Smoker    Quit date: 07/14/2006  . Smokeless tobacco: Never Used  . Alcohol use 4.2 oz/week    7 Shots of liquor per week     Comment: wine and vodka  . Drug use: No  . Sexual activity: Not on file   Other Topics Concern  . Not on file   Social History Narrative  . No narrative on file    Family History  Problem Relation Age of Onset  . Heart disease Mother     Past Surgical History:  Procedure Laterality Date  . ABDOMINAL HYSTERECTOMY    . CATARACT EXTRACTION Left     ROS: Review of Systems  PHYSICAL EXAM: BP  137/89   Pulse 93   Temp 98.1 F (36.7 C) (Oral)   Resp 16   Wt 136 lb 9.6 oz (62 kg)   SpO2 95%   BMI 20.17 kg/m   Wt Readings from Last 3 Encounters:  01/12/17 136 lb 9.6 oz (62 kg)  12/22/16 135 lb 3.2 oz (61.3 kg)  12/09/16 134 lb 3.2 oz (60.9 kg)   Physical Exam  General appearance - pleasant elderly AAF in NAD Mental status -patient oriented 3 Chest -clear to auscultation bilaterally Heart -regular rate and rhythm no gallops or murmurs Extremities -2+ pedal edema 1+ lower extremity edema. Hyperpigmented changes in the lower legs and around the ankles suggesting venostasis changes Skin: ectopic rash on LT posterior neck line and dorsum RT hand  ASSESSMENT AND PLAN: 1. Edema of both feet -There has not been much change in lower  extremity edema with DC of Norvasc. Therefore this seems to be VENOUS stasis changes. -Patient encouraged to wear compression stockings during the day. -Given low dose of furosemide to use when necessary.  2. Essential hypertension -Stop lisinopril due to loose stools. Patient restarted on Norvasc 10 mg which she was taking previously - Basic metabolic panel  3. Eczema, unspecified type - triamcinolone cream (KENALOG) 0.1 %; Apply 1 application topically 2 (two) times daily.  Dispense: 80 g; Refill: 1  4. Osteoporosis, unspecified osteoporosis type, unspecified pathological fracture presence -Patient to continue taking calcium and vitamin D. When she is finished with high-dose vitamin D I recommend purchasing and using vitamin D 1000 IUs over-the-counter daily -Check vitamin D level and creatinine/GFR today. If kidney function is improving we will consider Fosamax  5. CKD (chronic kidney disease) stage 3, GFR 30-59 ml/min See #3 above  6. Vitamin D deficiency - VITAMIN D 25 Hydroxy (Vit-D Deficiency, Fractures)  Patient was given the opportunity to ask questions.  Patient verbalized understanding of the plan and was able to repeat key elements of the plan.   No orders of the defined types were placed in this encounter.    Requested Prescriptions   Signed Prescriptions Disp Refills  . amLODipine (NORVASC) 10 MG tablet 90 tablet 3    Sig: Take 1 tablet (10 mg total) by mouth daily.  . furosemide (LASIX) 20 MG tablet 30 tablet 3    Sig: 1 tab twice a week as needed for swelling in legs and feet.  . triamcinolone cream (KENALOG) 0.1 % 80 g 1    Sig: Apply 1 application topically 2 (two) times daily.    Return in about 3 months (around 04/14/2017).  Karle Plumber, MD, FACP

## 2017-01-12 NOTE — Patient Instructions (Signed)
Stop Lisinopril. Restart Amlodipine 10 mg daily.  Use Furosemide (fluid pill) only as needed to help decrease swelling in your feet.   Wear your compression stockings during the day.    This will also help to decrease swelling in legs.  You should be taking Calcium 1200 mg daily.  Continue vitamin D until you have finished the current bottle then purchase Vitamin D 1000 IU over the counter and take one a day.

## 2017-01-13 ENCOUNTER — Telehealth: Payer: Self-pay | Admitting: Internal Medicine

## 2017-01-13 DIAGNOSIS — N183 Chronic kidney disease, stage 3 unspecified: Secondary | ICD-10-CM

## 2017-01-13 DIAGNOSIS — M810 Age-related osteoporosis without current pathological fracture: Secondary | ICD-10-CM

## 2017-01-13 LAB — BASIC METABOLIC PANEL
BUN / CREAT RATIO: 12 (ref 12–28)
BUN: 16 mg/dL (ref 8–27)
CHLORIDE: 102 mmol/L (ref 96–106)
CO2: 23 mmol/L (ref 20–29)
Calcium: 9.4 mg/dL (ref 8.7–10.3)
Creatinine, Ser: 1.29 mg/dL — ABNORMAL HIGH (ref 0.57–1.00)
GFR calc Af Amer: 47 mL/min/{1.73_m2} — ABNORMAL LOW (ref >59)
GFR calc non Af Amer: 41 mL/min/{1.73_m2} — ABNORMAL LOW (ref >59)
GLUCOSE: 94 mg/dL (ref 65–99)
POTASSIUM: 4.4 mmol/L (ref 3.5–5.2)
SODIUM: 141 mmol/L (ref 134–144)

## 2017-01-13 LAB — VITAMIN D 25 HYDROXY (VIT D DEFICIENCY, FRACTURES): VIT D 25 HYDROXY: 19.4 ng/mL — AB (ref 30.0–100.0)

## 2017-01-13 NOTE — Telephone Encounter (Signed)
Phone call placed to patient this a.m. to go over lab results. Vitamin D level was still low at 19.2. Last level 8 months ago was 21. I had patient collect her vitamin D bottle that she has at home and confirm for me. Dosed and the number of tablets she has left. She is taking vitamin D 50,000 international units every week. She has 6 pills left in the bottle. She is also taking calcium plus vitamin D 600 mg/800 international units O TC daily. Patient advised to call me when she has 2 pills left of the high-dose vitamin D at which time I will send a prescription to pharmacy for 2000 international units daily. -will refer to endocrine for osteoporosis. -Kidney function still at the level of CKD stage III with eGFR of 47. Patient states she is very worried about her kidneys. We will refer to nephrology. Patient expressed understanding of the plan and was given the opportunity to ask questions.

## 2017-02-19 ENCOUNTER — Ambulatory Visit: Payer: Medicare HMO | Admitting: Internal Medicine

## 2017-03-02 ENCOUNTER — Encounter: Payer: Self-pay | Admitting: Endocrinology

## 2017-03-02 ENCOUNTER — Ambulatory Visit (INDEPENDENT_AMBULATORY_CARE_PROVIDER_SITE_OTHER): Payer: Medicare HMO | Admitting: Endocrinology

## 2017-03-02 VITALS — BP 152/84 | HR 94 | Wt 139.0 lb

## 2017-03-02 DIAGNOSIS — M810 Age-related osteoporosis without current pathological fracture: Secondary | ICD-10-CM

## 2017-03-02 MED ORDER — VITAMIN D (ERGOCALCIFEROL) 1.25 MG (50000 UNIT) PO CAPS: 50000.0000 [IU] | ORAL_CAPSULE | ORAL | 0 refills | Status: DC

## 2017-03-02 NOTE — Patient Instructions (Addendum)
I have sent a prescription to your pharmacy, to take the stronger vitamin-D pill 3 times per week, for 1 month. Please come back for a follow-up appointment in 1 month. When you come back, we'll do blood tests.  The next step will probably be an osteoporosis pill.   It is critically important to prevent falling down (keep floor areas well-lit, dry, and free of loose objects.  If you have a cane, walker, or wheelchair, you should use it, even for short trips around the house.  Wear flat-soled shoes.  Also, try not to rush).

## 2017-03-02 NOTE — Progress Notes (Signed)
Subjective:    Patient ID: Tina Waller, female    DOB: 09-15-1940, 76 y.o.   MRN: [id number]  HPI Pt is referred by Dr. Wynetta Emery, for osteoporosis.  Pt was noted to have moderate osteoporosis at the right hip, in 2017.  No assoc hip fracture.  She has never been on medication for this.  she has never had bony fracture.  She has no history of any of the following: early menopause, multiple myeloma, thyroid problems, steroids, alcoholism, liver dz, primary hyperparathyroidism.  She does not take heparin or anticonvulsants.  She quit smoking in 2008, after a long smoking hx.  She took high-dose ergocalciferol in 2017.  She now takes non-rx vit-D, at an uncertain dosage (not the ergocalciferol described below).   Past Medical History:  Diagnosis Date  . Anemia   . Anxiety   . Cataract   . Depression   . Diabetes mellitus without complication ([place])    pt denies  . Gout   . Hypertension   . Stroke [facility]) 2008    Past Surgical History:  Procedure Laterality Date  . ABDOMINAL HYSTERECTOMY    . CATARACT EXTRACTION Left     Social History   Social History  . Marital status: Divorced    Spouse name: N/A  . Number of children: N/A  . Years of education: N/A   Occupational History  . Not on file.   Social History Main Topics  . Smoking status: Former Smoker    Quit date: 07/14/2006  . Smokeless tobacco: Never Used  . Alcohol use 4.2 oz/week    7 Shots of liquor per week     Comment: wine and vodka  . Drug use: No  . Sexual activity: Not on file   Other Topics Concern  . Not on file   Social History Narrative  . No narrative on file    Current Outpatient Prescriptions on File Prior to Visit  Medication Sig Dispense Refill  . allopurinol (ZYLOPRIM) 100 MG tablet Take 1 tablet (100 mg total) by mouth daily. 90 tablet 3  . amLODipine (NORVASC) 10 MG tablet Take 1 tablet (10 mg total) by mouth daily. 90 tablet 3  . aspirin EC 81 MG tablet Take 81 mg by mouth daily.    .  furosemide (LASIX) 20 MG tablet 1 tab twice a week as needed for swelling in legs and feet. 30 tablet 3  . metoprolol succinate (TOPROL-XL) 25 MG 24 hr tablet Take 1 tablet (25 mg total) by mouth daily. 90 tablet 3  . saccharomyces boulardii (FLORASTOR) 250 MG capsule Take 1 capsule (250 mg total) by mouth 2 (two) times daily. 30 capsule 1  . sertraline (ZOLOFT) 50 MG tablet Take 1 tablet (50 mg total) by mouth at bedtime. (Patient not taking: Reported on 12/22/2016) 90 tablet 2  . triamcinolone cream (KENALOG) 0.1 % Apply 1 application topically 2 (two) times daily. 80 g 1   Current Facility-Administered Medications on File Prior to Visit  Medication Dose Route Frequency Provider Last Rate Last Dose  . 0.9 %  sodium chloride infusion  500 mL Intravenous Continuous Doran Stabler, MD        Allergies  Allergen Reactions  . Lipitor [Atorvastatin]     Family History  Problem Relation Age of Onset  . Heart disease Mother   . Osteoporosis Neg Hx     BP (!) 152/84   Pulse 94   Wt 139 lb (63 kg)  SpO2 95%   BMI 20.53 kg/m     Review of Systems denies weight loss, hematuria, heartburn, chest pain, skin rash, falls, cramps, memory loss, back pain, easy bruising, and rhinorrhea.  She has insomnia. She has slight swelling of the ankles, and cold intolerance      Objective:   Physical Exam VS: see vs page GEN: no distress HEAD: head: no deformity eyes: no periorbital swelling, no proptosis external nose and ears are normal mouth: no lesion seen NECK: supple, thyroid is not enlarged CHEST WALL: no deformity.  No  kyphosis LUNGS: clear to auscultation CV: reg rate and rhythm, no murmur ABD: abdomen is soft, nontender.  no hepatosplenomegaly.  not distended.  no hernia MUSCULOSKELETAL: muscle bulk and strength are grossly normal.  no obvious joint swelling.  gait is normal and steady EXTEMITIES: no deformity.  no ulcer on the feet.  feet are of normal color and temp.  1+ bilat  pedal edema PULSES: dorsalis pedis intact bilat.  no carotid bruit NEURO:  cn 2-12 grossly intact.   readily moves all 4's.  sensation is intact to touch on the feet SKIN:  Normal texture and temperature.  No rash or suspicious lesion is visible.  Patchy hyperpigmentation of the feet.  Old healed surgical scars of the left foot.  NODES:  None palpable at the neck.  PSYCH: alert, well-oriented.  Does not appear anxious nor depressed.   25-OH vit-D=19  T-score of -2.9.  Lab Results  Component Value Date   TSH 1.26 04/21/2016   Lab Results  Component Value Date   CREATININE 1.29 (H) 01/12/2017   BUN 16 01/12/2017   NA 141 01/12/2017   K 4.4 01/12/2017   CL 102 01/12/2017   CO2 23 01/12/2017   I have reviewed outside records, and summarized: Pt was noted to have osteoporosis, and referred here.  She was mostly finished with a course of high-dose ergocalciferol.    Lab Results  Component Value Date   CALCIUM 9.4 01/12/2017   CAION 1.20 12/06/2012      Assessment & Plan:  Vit-D deficiency: we'll fix this first Osteoporosis, new to me: she will prob need rx for this, also. She is not taking high-dose ergocalciferol as she stated at recent PCP OV.   Patient Instructions  I have sent a prescription to your pharmacy, to take the stronger vitamin-D pill 3 times per week, for 1 month. Please come back for a follow-up appointment in 1 month. When you come back, we'll do blood tests.  The next step will probably be an osteoporosis pill.   It is critically important to prevent falling down (keep floor areas well-lit, dry, and free of loose objects.  If you have a cane, walker, or wheelchair, you should use it, even for short trips around the house.  Wear flat-soled shoes.  Also, try not to rush)

## 2017-04-02 ENCOUNTER — Encounter: Payer: Self-pay | Admitting: Endocrinology

## 2017-04-02 ENCOUNTER — Ambulatory Visit (INDEPENDENT_AMBULATORY_CARE_PROVIDER_SITE_OTHER): Payer: Medicare HMO | Admitting: Endocrinology

## 2017-04-02 VITALS — BP 134/76 | HR 94 | Wt 139.2 lb

## 2017-04-02 DIAGNOSIS — E559 Vitamin D deficiency, unspecified: Secondary | ICD-10-CM | POA: Diagnosis not present

## 2017-04-02 LAB — VITAMIN D 25 HYDROXY (VIT D DEFICIENCY, FRACTURES): VITD: 59.87 ng/mL (ref 30.00–100.00)

## 2017-04-02 MED ORDER — IBANDRONATE SODIUM 150 MG PO TABS: 150.0000 mg | ORAL_TABLET | ORAL | 3 refills | Status: DC

## 2017-04-02 NOTE — Progress Notes (Signed)
Subjective:    Patient ID: Tina Waller, female    DOB: [birth year], 76 y.o.   MRN: [id number]  HPI Pt returns for f/u osteoporosis (dx'ed 2017; bis-phosphonate rx is planned for when vit-D is better; she has never been on medication for this.  she has never had bony fracture; she quit smoking in 2008, after a long smoking hx; she took high-dose ergocalciferol in 2017 and 2018).  Since she took the ergocalciferol, pt states she feels no different, and well in general.   Past Medical History:  Diagnosis Date  . Anemia   . Anxiety   . Cataract   . Depression   . Diabetes mellitus without complication ([place])    pt denies  . Gout   . Hypertension   . Stroke [facility]) 2008    Past Surgical History:  Procedure Laterality Date  . ABDOMINAL HYSTERECTOMY    . CATARACT EXTRACTION Left     Social History   Social History  . Marital status: Divorced    Spouse name: N/A  . Number of children: N/A  . Years of education: N/A   Occupational History  . Not on file.   Social History Main Topics  . Smoking status: Former Smoker    Quit date: 07/14/2006  . Smokeless tobacco: Never Used  . Alcohol use 4.2 oz/week    7 Shots of liquor per week     Comment: wine and vodka  . Drug use: No  . Sexual activity: Not on file   Other Topics Concern  . Not on file   Social History Narrative  . No narrative on file    Current Outpatient Prescriptions on File Prior to Visit  Medication Sig Dispense Refill  . allopurinol (ZYLOPRIM) 100 MG tablet Take 1 tablet (100 mg total) by mouth daily. 90 tablet 3  . amLODipine (NORVASC) 10 MG tablet Take 1 tablet (10 mg total) by mouth daily. 90 tablet 3  . aspirin EC 81 MG tablet Take 81 mg by mouth daily.    . furosemide (LASIX) 20 MG tablet 1 tab twice a week as needed for swelling in legs and feet. 30 tablet 3  . metoprolol succinate (TOPROL-XL) 25 MG 24 hr tablet Take 1 tablet (25 mg total) by mouth daily. 90 tablet 3  . saccharomyces boulardii  (FLORASTOR) 250 MG capsule Take 1 capsule (250 mg total) by mouth 2 (two) times daily. 30 capsule 1  . sertraline (ZOLOFT) 50 MG tablet Take 1 tablet (50 mg total) by mouth at bedtime. (Patient not taking: Reported on 12/22/2016) 90 tablet 2  . triamcinolone cream (KENALOG) 0.1 % Apply 1 application topically 2 (two) times daily. 80 g 1   Current Facility-Administered Medications on File Prior to Visit  Medication Dose Route Frequency Provider Last Rate Last Dose  . 0.9 %  sodium chloride infusion  500 mL Intravenous Continuous Doran Stabler, MD        Allergies  Allergen Reactions  . Lipitor [Atorvastatin]     Family History  Problem Relation Age of Onset  . Heart disease Mother   . Osteoporosis Neg Hx     BP 134/76   Pulse 94   Wt 139 lb 3.2 oz (63.1 kg)   SpO2 94%   BMI 20.56 kg/m   Review of Systems Denies falls and heartburn.     Objective:   Physical Exam VITAL SIGNS:  See vs page GENERAL: no distress Gait is steady, with a cane.  Vit-D=normal    Assessment & Plan:  Vit-D deficiency: due for recheck Osteoporosis: I have sent a prescription to your pharmacy, for boniva

## 2017-04-02 NOTE — Patient Instructions (Addendum)
blood tests are requested for you today.  We'll let you know about the results. Based on the results, I would probably prescribe for you a once a month osteoporosis pill. Please take non-prescription vitamin-D, 2000 units per day.   It is critically important to prevent falling down (keep floor areas well-lit, dry, and free of loose objects.  If you have a cane, walker, or wheelchair, you should use it, even for short trips around the house.  Wear flat-soled shoes.  Also, try not to rush).

## 2017-04-06 LAB — PTH, INTACT AND CALCIUM
CALCIUM: 9.2 mg/dL (ref 8.6–10.4)
PTH: 112 pg/mL — AB (ref 14–64)

## 2017-04-17 ENCOUNTER — Ambulatory Visit: Payer: Medicare HMO | Attending: Internal Medicine | Admitting: Internal Medicine

## 2017-04-17 ENCOUNTER — Encounter: Payer: Self-pay | Admitting: Internal Medicine

## 2017-04-17 VITALS — BP 134/71 | HR 78 | Temp 98.4°F | Resp 16 | Wt 145.6 lb

## 2017-04-17 DIAGNOSIS — I129 Hypertensive chronic kidney disease with stage 1 through stage 4 chronic kidney disease, or unspecified chronic kidney disease: Secondary | ICD-10-CM | POA: Diagnosis not present

## 2017-04-17 DIAGNOSIS — Z87891 Personal history of nicotine dependence: Secondary | ICD-10-CM | POA: Insufficient documentation

## 2017-04-17 DIAGNOSIS — I10 Essential (primary) hypertension: Secondary | ICD-10-CM

## 2017-04-17 DIAGNOSIS — Z79899 Other long term (current) drug therapy: Secondary | ICD-10-CM | POA: Insufficient documentation

## 2017-04-17 DIAGNOSIS — I872 Venous insufficiency (chronic) (peripheral): Secondary | ICD-10-CM | POA: Insufficient documentation

## 2017-04-17 DIAGNOSIS — N183 Chronic kidney disease, stage 3 unspecified: Secondary | ICD-10-CM | POA: Insufficient documentation

## 2017-04-17 DIAGNOSIS — Z7982 Long term (current) use of aspirin: Secondary | ICD-10-CM | POA: Insufficient documentation

## 2017-04-17 DIAGNOSIS — D649 Anemia, unspecified: Secondary | ICD-10-CM | POA: Diagnosis not present

## 2017-04-17 DIAGNOSIS — E559 Vitamin D deficiency, unspecified: Secondary | ICD-10-CM | POA: Diagnosis not present

## 2017-04-17 DIAGNOSIS — R197 Diarrhea, unspecified: Secondary | ICD-10-CM | POA: Diagnosis not present

## 2017-04-17 DIAGNOSIS — Z23 Encounter for immunization: Secondary | ICD-10-CM | POA: Insufficient documentation

## 2017-04-17 DIAGNOSIS — M109 Gout, unspecified: Secondary | ICD-10-CM | POA: Diagnosis not present

## 2017-04-17 DIAGNOSIS — E785 Hyperlipidemia, unspecified: Secondary | ICD-10-CM | POA: Insufficient documentation

## 2017-04-17 DIAGNOSIS — Z8673 Personal history of transient ischemic attack (TIA), and cerebral infarction without residual deficits: Secondary | ICD-10-CM | POA: Insufficient documentation

## 2017-04-17 DIAGNOSIS — M810 Age-related osteoporosis without current pathological fracture: Secondary | ICD-10-CM | POA: Insufficient documentation

## 2017-04-17 DIAGNOSIS — F329 Major depressive disorder, single episode, unspecified: Secondary | ICD-10-CM | POA: Diagnosis not present

## 2017-04-17 MED ORDER — IBANDRONATE SODIUM 150 MG PO TABS: 150.0000 mg | ORAL_TABLET | ORAL | 3 refills | Status: DC

## 2017-04-17 NOTE — Patient Instructions (Signed)
Please pick up. Prescription for osteoporosis at [facility].  Use Imodium once a day as needed for loose stools.  Use the triamcinolone cream twice a day on your legs. Try to avoid scratching. Use your compression socks daily to help control swelling in the legs.

## 2017-04-17 NOTE — Progress Notes (Signed)
Patient ID: Tina Waller, female    DOB: August 13, 1940  MRN: [id number]  CC: Follow-up   Subjective: Tina Waller is a 76 y.o. female who presents for chronic ds management. Her concerns today include:  Patient with history of hypertension, hyperlipidemia, vitamin D deficiency, osteoporosis on Dexa done fall 2017, depression, CKD stage 3, anemia (taking OTC iron once a day),CVA in 2008 and gout.  1. HTN: Compliant with Norvasc and Metoprolol Checks BP QOD. Gives range 138/80s, 140s/80s -limits salt. No CP. Some SOB if she walks more than 1 block -Lasix helps with swelling. Has not taken as yet for today  2. Osteoporosis: saw endocrine since last visit. Started on [place] but she did not pick up. Looks like rxn was printed but pt does not recall receiving it. -completed high dose Vit D. Last vit D level was 59.8  3. CKD stage 3: has appt with kidney specialist later this mth Not on any NSAIDs  4. Scratches RT lower leg. Itches a lot in mornings.  Using Triamcinolone cream once a day  5. Has at least 4 BM a day for past 2 mths. Soft to loose. Sometimes incontinence. Wears Depends. Stopped drinking coffee and eating grapes as they seem to make bowels loose. Not taking any stool softeners. Uses Imodium as needed. No recent antibiotics . No blood in stools. Drinks 1 mixed alcohol beverage every day on the weekends.  Reports good appetite. Sleeping OK - about 8 hrs at nights   Patient Active Problem List   Diagnosis Date Noted  . Osteoporosis 12/22/2016  . Hx of gout 12/22/2016  . Estrogen deficiency 05/12/2016  . Vitamin D deficiency 05/12/2016  . Weight loss 05/12/2016  . HTN (hypertension) 06/12/2015  . History of CVA (cerebrovascular accident) 03/28/2015     Current Outpatient Prescriptions on File Prior to Visit  Medication Sig Dispense Refill  . allopurinol (ZYLOPRIM) 100 MG tablet Take 1 tablet (100 mg total) by mouth daily. 90 tablet 3  . amLODipine (NORVASC)  10 MG tablet Take 1 tablet (10 mg total) by mouth daily. 90 tablet 3  . aspirin EC 81 MG tablet Take 81 mg by mouth daily.    . furosemide (LASIX) 20 MG tablet 1 tab twice a week as needed for swelling in legs and feet. 30 tablet 3  . metoprolol succinate (TOPROL-XL) 25 MG 24 hr tablet Take 1 tablet (25 mg total) by mouth daily. 90 tablet 3  . saccharomyces boulardii (FLORASTOR) 250 MG capsule Take 1 capsule (250 mg total) by mouth 2 (two) times daily. 30 capsule 1  . sertraline (ZOLOFT) 50 MG tablet Take 1 tablet (50 mg total) by mouth at bedtime. (Patient not taking: Reported on 12/22/2016) 90 tablet 2  . triamcinolone cream (KENALOG) 0.1 % Apply 1 application topically 2 (two) times daily. 80 g 1   Current Facility-Administered Medications on File Prior to Visit  Medication Dose Route Frequency Provider Last Rate Last Dose  . 0.9 %  sodium chloride infusion  500 mL Intravenous Continuous Doran Stabler, MD        Allergies  Allergen Reactions  . Lipitor [Atorvastatin]     Social History   Social History  . Marital status: Divorced    Spouse name: N/A  . Number of children: N/A  . Years of education: N/A   Occupational History  . Not on file.   Social History Main Topics  . Smoking status: Former Audiological scientist  date: 07/14/2006  . Smokeless tobacco: Never Used  . Alcohol use 4.2 oz/week    7 Shots of liquor per week     Comment: wine and vodka  . Drug use: No  . Sexual activity: Not on file   Other Topics Concern  . Not on file   Social History Narrative  . No narrative on file    Family History  Problem Relation Age of Onset  . Heart disease Mother   . Osteoporosis Neg Hx     Past Surgical History:  Procedure Laterality Date  . ABDOMINAL HYSTERECTOMY    . CATARACT EXTRACTION Left     ROS: Review of Systems Negative except as stated above PHYSICAL EXAM: BP 134/71   Pulse 78   Temp 98.4 F (36.9 C) (Oral)   Resp 16   Wt 145 lb 9.6 oz (66 kg)    SpO2 95%   BMI 21.50 kg/m   Wt Readings from Last 3 Encounters:  04/17/17 145 lb 9.6 oz (66 kg)  04/02/17 139 lb 3.2 oz (63.1 kg)  03/02/17 139 lb (63 kg)    Physical Exam  General appearance - alert, well appearing, and in no distress Mental status - alert, oriented to person, place, and time, normal mood, behavior, speech, dress, motor activity, and thought processes Neck - supple, no significant adenopathy Chest - clear to auscultation, no wheezes, rales or rhonchi, symmetric air entry Heart - normal rate, regular rhythm, normal S1, S2, no murmurs, rubs, clicks or gallops Extremities -+ LE edema RT>LT Skin - hyperpigmented stasis changes in both lower legs around ankles. Increased excoriation on the right lower leg above the inner ankle. Fine spider veins in feet     RT lower leg    Chemistry      Component Value Date/Time   NA 141 01/12/2017 1518   K 4.4 01/12/2017 1518   CL 102 01/12/2017 1518   CO2 23 01/12/2017 1518   BUN 16 01/12/2017 1518   CREATININE 1.29 (H) 01/12/2017 1518   CREATININE 1.35 (H) 04/21/2016 1437      Component Value Date/Time   CALCIUM 9.2 04/02/2017 1138   ALKPHOS 139 (H) 11/12/2016 1132   AST 29 11/12/2016 1132   ALT 22 11/12/2016 1132   BILITOT 0.5 11/12/2016 1132     Lab Results  Component Value Date   WBC 5.7 11/12/2016   HGB 10.4 (L) 11/12/2016   HCT 29.2 (L) 11/12/2016   MCV 104 (H) 11/12/2016   PLT 624 (H) 11/12/2016    ASSESSMENT AND PLAN: 1. Essential hypertension At goal. Continue current medications and salt restriction  2. Osteoporosis, unspecified osteoporosis type, unspecified pathological fracture presence I printed prescription for Boniva and gave it to her today to take to her pharmacy  3. CKD (chronic kidney disease) stage 3, GFR 30-59 ml/min (H[facility]) Keep upcoming appointment with nephrologist  4. Stasis dermatitis of both legs -Encourage patient to avoid scratching to prevent secondary infection. Advised to  use triamcinolone cream twice a day on the distal legs/ankles Encourage use of compression socks - Ambulatory referral to Dermatology  5. Diarrhea, unspecified type -? Etiology. Pt to avoid foods that may cause/contribute. Use Imodium as needed - Ambulatory referral to Gastroenterology  6. Need for influenza vaccination - Flu Vaccine QUAD 6+ mos PF IM (Fluarix Quad PF)  Patient was given the opportunity to ask questions.  Patient verbalized understanding of the plan and was able to repeat key elements of the plan.  Orders Placed This Encounter  Procedures  . Flu Vaccine QUAD 6+ mos PF IM (Fluarix Quad PF)  . Ambulatory referral to Gastroenterology  . Ambulatory referral to Dermatology     Requested Prescriptions   Signed Prescriptions Disp Refills  . ibandronate (BONIVA) 150 MG tablet 3 tablet 3    Sig: Take 1 tablet (150 mg total) by mouth every 30 (thirty) days. Take with a full glass of water, on an empty stomach, and stay upright for 30 min.    Return in about 3 months (around 07/18/2017).  Karle Plumber, MD, FACP

## 2017-04-29 DIAGNOSIS — I129 Hypertensive chronic kidney disease with stage 1 through stage 4 chronic kidney disease, or unspecified chronic kidney disease: Secondary | ICD-10-CM | POA: Diagnosis not present

## 2017-04-29 DIAGNOSIS — N183 Chronic kidney disease, stage 3 (moderate): Secondary | ICD-10-CM | POA: Diagnosis not present

## 2017-04-29 DIAGNOSIS — E213 Hyperparathyroidism, unspecified: Secondary | ICD-10-CM | POA: Diagnosis not present

## 2017-04-29 DIAGNOSIS — D649 Anemia, unspecified: Secondary | ICD-10-CM | POA: Diagnosis not present

## 2017-05-01 ENCOUNTER — Other Ambulatory Visit: Payer: Self-pay | Admitting: Nephrology

## 2017-05-01 DIAGNOSIS — N183 Chronic kidney disease, stage 3 unspecified: Secondary | ICD-10-CM

## 2017-05-04 ENCOUNTER — Telehealth: Payer: Self-pay | Admitting: Internal Medicine

## 2017-05-04 DIAGNOSIS — I10 Essential (primary) hypertension: Secondary | ICD-10-CM

## 2017-05-04 MED ORDER — METOPROLOL SUCCINATE ER 25 MG PO TB24: 25.0000 mg | ORAL_TABLET | Freq: Every day | ORAL | 0 refills | Status: DC

## 2017-05-04 MED ORDER — AMLODIPINE BESYLATE 10 MG PO TABS: 10.0000 mg | ORAL_TABLET | Freq: Every day | ORAL | 0 refills | Status: DC

## 2017-05-04 NOTE — Telephone Encounter (Signed)
Pt. Called requesting a refill on the following medications:  amLODipine (NORVASC) 10 MG tablet  metoprolol succinate (TOPROL-XL) 25 MG 24 hr tablet   Pt. Gets her medications delivered to her home. Pt. Uses [facility]. Please f/u

## 2017-05-04 NOTE — Telephone Encounter (Signed)
Requested medications refilled 

## 2017-05-07 DIAGNOSIS — N183 Chronic kidney disease, stage 3 (moderate): Secondary | ICD-10-CM | POA: Diagnosis not present

## 2017-05-20 ENCOUNTER — Encounter: Payer: Self-pay | Admitting: Internal Medicine

## 2017-05-20 NOTE — Progress Notes (Signed)
Rec note from [facility].  Assessed to have HTNsive nephrosclerosis, kidney US was ordered. Repeat BMP with creat 1.18 and eGFR 53. F/u in 6 mths.

## 2017-05-27 ENCOUNTER — Inpatient Hospital Stay
Admission: RE | Admit: 2017-05-27 | Discharge: 2017-05-27 | Disposition: A | Discharge status: Home / Self Care | Payer: Medicare HMO | Source: Ambulatory Visit | Attending: Nephrology | Admitting: Nephrology

## 2017-06-25 ENCOUNTER — Inpatient Hospital Stay: Admission: RE | Admit: 2017-06-25 | Payer: Medicare HMO | Source: Ambulatory Visit

## 2017-06-30 ENCOUNTER — Ambulatory Visit: Payer: Medicare HMO | Admitting: Gastroenterology

## 2017-07-17 ENCOUNTER — Ambulatory Visit: Payer: Medicare HMO | Admitting: Internal Medicine

## 2017-07-24 ENCOUNTER — Encounter: Payer: Self-pay | Admitting: Internal Medicine

## 2017-07-24 ENCOUNTER — Ambulatory Visit: Payer: Medicare HMO | Attending: Internal Medicine | Admitting: Internal Medicine

## 2017-07-24 VITALS — BP 120/80 | HR 84 | Temp 98.0°F | Resp 16 | Wt 145.0 lb

## 2017-07-24 DIAGNOSIS — I129 Hypertensive chronic kidney disease with stage 1 through stage 4 chronic kidney disease, or unspecified chronic kidney disease: Secondary | ICD-10-CM | POA: Insufficient documentation

## 2017-07-24 DIAGNOSIS — E559 Vitamin D deficiency, unspecified: Secondary | ICD-10-CM | POA: Diagnosis not present

## 2017-07-24 DIAGNOSIS — Z7983 Long term (current) use of bisphosphonates: Secondary | ICD-10-CM | POA: Diagnosis not present

## 2017-07-24 DIAGNOSIS — Z90710 Acquired absence of both cervix and uterus: Secondary | ICD-10-CM | POA: Diagnosis not present

## 2017-07-24 DIAGNOSIS — Z8673 Personal history of transient ischemic attack (TIA), and cerebral infarction without residual deficits: Secondary | ICD-10-CM | POA: Insufficient documentation

## 2017-07-24 DIAGNOSIS — M810 Age-related osteoporosis without current pathological fracture: Secondary | ICD-10-CM | POA: Diagnosis not present

## 2017-07-24 DIAGNOSIS — Z8249 Family history of ischemic heart disease and other diseases of the circulatory system: Secondary | ICD-10-CM | POA: Insufficient documentation

## 2017-07-24 DIAGNOSIS — N183 Chronic kidney disease, stage 3 unspecified: Secondary | ICD-10-CM

## 2017-07-24 DIAGNOSIS — Z87891 Personal history of nicotine dependence: Secondary | ICD-10-CM | POA: Diagnosis not present

## 2017-07-24 DIAGNOSIS — I10 Essential (primary) hypertension: Secondary | ICD-10-CM

## 2017-07-24 DIAGNOSIS — D649 Anemia, unspecified: Secondary | ICD-10-CM | POA: Insufficient documentation

## 2017-07-24 DIAGNOSIS — E785 Hyperlipidemia, unspecified: Secondary | ICD-10-CM | POA: Insufficient documentation

## 2017-07-24 DIAGNOSIS — Z79899 Other long term (current) drug therapy: Secondary | ICD-10-CM | POA: Diagnosis not present

## 2017-07-24 DIAGNOSIS — R197 Diarrhea, unspecified: Secondary | ICD-10-CM | POA: Diagnosis not present

## 2017-07-24 DIAGNOSIS — Z888 Allergy status to other drugs, medicaments and biological substances status: Secondary | ICD-10-CM | POA: Insufficient documentation

## 2017-07-24 DIAGNOSIS — F329 Major depressive disorder, single episode, unspecified: Secondary | ICD-10-CM | POA: Insufficient documentation

## 2017-07-24 DIAGNOSIS — Z7982 Long term (current) use of aspirin: Secondary | ICD-10-CM | POA: Insufficient documentation

## 2017-07-24 DIAGNOSIS — I872 Venous insufficiency (chronic) (peripheral): Secondary | ICD-10-CM

## 2017-07-24 DIAGNOSIS — M109 Gout, unspecified: Secondary | ICD-10-CM | POA: Insufficient documentation

## 2017-07-24 DIAGNOSIS — Z9842 Cataract extraction status, left eye: Secondary | ICD-10-CM | POA: Diagnosis present

## 2017-07-24 MED ORDER — AMLODIPINE BESYLATE 10 MG PO TABS: 10.0000 mg | ORAL_TABLET | Freq: Every day | ORAL | 3 refills | Status: DC

## 2017-07-24 MED ORDER — METOPROLOL SUCCINATE ER 25 MG PO TB24: 25.0000 mg | ORAL_TABLET | Freq: Every day | ORAL | 3 refills | Status: DC

## 2017-07-24 NOTE — Patient Instructions (Signed)
Please check your blood pressure at home at least twice a week.  The goal is to be 140/90 or lower.  I have sent a message to our referral person to check on the appointment for the dermatologist and the gastroenterologist.

## 2017-07-24 NOTE — Progress Notes (Signed)
Patient ID: Tina Waller, female    DOB: 02-14-1941  MRN: [id number]  CC: Hypertension   Subjective: Tina Waller is a 77 y.o. female who presents for chronic ds management Her concerns today include:  Patient with history of hypertension, hyperlipidemia, vitamin D deficiency, osteoporosis on Dexa done fall 2017, depression, CKD stage 3, anemia (taking OTC iron once a day),CVA in 2008 and gout.  1.  CKD/HTN:  Saw [facility] since last visitt Assessed to have HTNsive nephrosclerosis, kidney US was ordered and scheduled for next month. Repeat BMP with creat 1.18 and eGFR 53. F/u in 6 mths. -checks BP intermittently when she has a headache.  -needs RF on metoprolol and amlodipine  2. Osteoporosis: did get Bonvia.  Tolerating okay Taking Ca+ and Vit D OTC daily  3.  Stasis dermatitis: Rash on the lower legs not any better with triamcinolone cream.  However the cream does help to decrease the itching.  Referred to dermatology but has not received an appointment as yet.  Swelling in the legs better   4.  Diarrhea: did not get GI appt as yet.  Using Imodium as needed  Patient Active Problem List   Diagnosis Date Noted  . CKD (chronic kidney disease) stage 3, GFR 30-59 ml/min (HCC) 04/17/2017  . Stasis dermatitis of both legs 04/17/2017  . Osteoporosis 12/22/2016  . Hx of gout 12/22/2016  . Estrogen deficiency 05/12/2016  . Vitamin D deficiency 05/12/2016  . Weight loss 05/12/2016  . HTN (hypertension) 06/12/2015  . History of CVA (cerebrovascular accident) 03/28/2015     Current Outpatient Medications on File Prior to Visit  Medication Sig Dispense Refill  . allopurinol (ZYLOPRIM) 100 MG tablet Take 1 tablet (100 mg total) by mouth daily. 90 tablet 3  . amLODipine (NORVASC) 10 MG tablet Take 1 tablet (10 mg total) by mouth daily. 90 tablet 0  . aspirin EC 81 MG tablet Take 81 mg by mouth daily.    . furosemide (LASIX) 20 MG tablet 1 tab twice a week as needed for  swelling in legs and feet. 30 tablet 3  . ibandronate (BONIVA) 150 MG tablet Take 1 tablet (150 mg total) by mouth every 30 (thirty) days. Take with a full glass of water, on an empty stomach, and stay upright for 30 min. 3 tablet 3  . metoprolol succinate (TOPROL-XL) 25 MG 24 hr tablet Take 1 tablet (25 mg total) by mouth daily. 90 tablet 0  . saccharomyces boulardii (FLORASTOR) 250 MG capsule Take 1 capsule (250 mg total) by mouth 2 (two) times daily. 30 capsule 1  . sertraline (ZOLOFT) 50 MG tablet Take 1 tablet (50 mg total) by mouth at bedtime. (Patient not taking: Reported on 12/22/2016) 90 tablet 2  . triamcinolone cream (KENALOG) 0.1 % Apply 1 application topically 2 (two) times daily. 80 g 1   Current Facility-Administered Medications on File Prior to Visit  Medication Dose Route Frequency Provider Last Rate Last Dose  . 0.9 %  sodium chloride infusion  500 mL Intravenous Continuous Doran Stabler, MD        Allergies  Allergen Reactions  . Lipitor [Atorvastatin]     Social History   Socioeconomic History  . Marital status: Divorced    Spouse name: Not on file  . Number of children: Not on file  . Years of education: Not on file  . Highest education level: Not on file  Social Needs  . Financial resource strain: Not  on file  . Food insecurity - worry: Not on file  . Food insecurity - inability: Not on file  . Transportation needs - medical: Not on file  . Transportation needs - non-medical: Not on file  Occupational History  . Not on file  Tobacco Use  . Smoking status: Former Smoker    Last attempt to quit: 07/14/2006    Years since quitting: 11.0  . Smokeless tobacco: Never Used  Substance and Sexual Activity  . Alcohol use: Yes    Alcohol/week: 4.2 oz    Types: 7 Shots of liquor per week    Comment: wine and vodka  . Drug use: No  . Sexual activity: Not on file  Other Topics Concern  . Not on file  Social History Narrative  . Not on file    Family  History  Problem Relation Age of Onset  . Heart disease Mother   . Osteoporosis Neg Hx     Past Surgical History:  Procedure Laterality Date  . ABDOMINAL HYSTERECTOMY    . CATARACT EXTRACTION Left     ROS: Review of Systems Negative except as stated above PHYSICAL EXAM: BP (!) 145/82   Pulse 84   Temp 98 F (36.7 C) (Oral)   Resp 16   Wt 145 lb (65.8 kg)   SpO2 96%   BMI 21.41 kg/m   Wt Readings from Last 3 Encounters:  07/24/17 145 lb (65.8 kg)  04/17/17 145 lb 9.6 oz (66 kg)  04/02/17 139 lb 3.2 oz (63.1 kg)   Repeat BP 120/80 Physical Exam  General appearance - alert, well appearing, pleasant elderly female and in no distress Mental status - alert, oriented to person, place, and time, normal mood, behavior, speech, dress, motor activity, and thought processes Mouth - mucous membranes moist, pharynx normal without lesions Neck - supple, no significant adenopathy Chest - clear to auscultation, no wheezes, rales or rhonchi, symmetric air entry Heart - normal rate, regular rhythm, normal S1, S2, no murmurs, rubs, clicks or gallops Extremities -trace lower extremity edema.  Trace to 1+ dorsal pedal edema.  Hyperpigmented changes lower legs and around ankles   ASSESSMENT AND PLAN: 1. Essential hypertension At goal.  Continue DASH diet and current meds. - metoprolol succinate (TOPROL-XL) 25 MG 24 hr tablet; Take 1 tablet (25 mg total) by mouth daily.  Dispense: 90 tablet; Refill: 3 - amLODipine (NORVASC) 10 MG tablet; Take 1 tablet (10 mg total) by mouth daily.  Dispense: 90 tablet; Refill: 3  2. CKD (chronic kidney disease) stage 3, GFR 30-59 ml/min (HCC) Stable. Kidney ultrasound scheduled for next month.  3. Osteoporosis, unspecified osteoporosis type, unspecified pathological fracture presence Continue Boniva and over-the-counter calcium plus vitamin D supplement -Encourage her to walk for exercise 4. Diarrhea, unspecified type Await GI appointment  5. Stasis  dermatitis of both legs Swelling in lower legs have decreased with use of support socks.  Await Derm appointment for the rash that persists  Patient was given the opportunity to ask questions.  Patient verbalized understanding of the plan and was able to repeat key elements of the plan.   No orders of the defined types were placed in this encounter.    Requested Prescriptions    No prescriptions requested or ordered in this encounter    No Follow-up on file.  Karle Plumber, MD, FACP

## 2017-07-24 NOTE — Progress Notes (Signed)
Pt is requesting refills on amlodipine and metoprolol

## 2017-07-31 ENCOUNTER — Encounter: Payer: Self-pay | Admitting: Internal Medicine

## 2017-09-02 ENCOUNTER — Other Ambulatory Visit: Payer: Medicare HMO

## 2017-09-04 ENCOUNTER — Ambulatory Visit
Admission: RE | Admit: 2017-09-04 | Discharge: 2017-09-04 | Disposition: A | Discharge status: Home / Self Care | Payer: Medicare HMO | Source: Ambulatory Visit | Attending: Nephrology | Admitting: Nephrology

## 2017-09-04 DIAGNOSIS — N183 Chronic kidney disease, stage 3 unspecified: Secondary | ICD-10-CM

## 2017-09-04 DIAGNOSIS — N189 Chronic kidney disease, unspecified: Secondary | ICD-10-CM | POA: Diagnosis not present

## 2017-10-21 ENCOUNTER — Ambulatory Visit (HOSPITAL_COMMUNITY)
Admission: EM | Admit: 2017-10-21 | Discharge: 2017-10-21 | Disposition: A | Discharge status: Home / Self Care | Payer: Medicare HMO | Attending: Family Medicine | Admitting: Family Medicine

## 2017-10-21 ENCOUNTER — Encounter (HOSPITAL_COMMUNITY): Payer: Self-pay | Admitting: Emergency Medicine

## 2017-10-21 ENCOUNTER — Other Ambulatory Visit: Payer: Self-pay

## 2017-10-21 ENCOUNTER — Ambulatory Visit (INDEPENDENT_AMBULATORY_CARE_PROVIDER_SITE_OTHER): Payer: Medicare HMO

## 2017-10-21 DIAGNOSIS — R630 Anorexia: Secondary | ICD-10-CM

## 2017-10-21 DIAGNOSIS — R109 Unspecified abdominal pain: Secondary | ICD-10-CM | POA: Diagnosis not present

## 2017-10-21 DIAGNOSIS — K5900 Constipation, unspecified: Secondary | ICD-10-CM | POA: Diagnosis not present

## 2017-10-21 DIAGNOSIS — R1032 Left lower quadrant pain: Secondary | ICD-10-CM | POA: Diagnosis not present

## 2017-10-21 LAB — POCT I-STAT, CHEM 8
BUN: 11 mg/dL (ref 6–20)
CALCIUM ION: 1.06 mmol/L — AB (ref 1.15–1.40)
CHLORIDE: 106 mmol/L (ref 101–111)
Creatinine, Ser: 1.1 mg/dL — ABNORMAL HIGH (ref 0.44–1.00)
GLUCOSE: 106 mg/dL — AB (ref 65–99)
HCT: 37 % (ref 36.0–46.0)
Hemoglobin: 12.6 g/dL (ref 12.0–15.0)
POTASSIUM: 3.5 mmol/L (ref 3.5–5.1)
Sodium: 140 mmol/L (ref 135–145)
TCO2: 22 mmol/L (ref 22–32)

## 2017-10-21 MED ORDER — POLYETHYLENE GLYCOL 3350 17 G PO PACK: 17.0000 g | PACK | Freq: Every day | ORAL | 0 refills | Status: DC | PRN

## 2017-10-21 MED ORDER — DOCUSATE SODIUM 50 MG PO CAPS: 50.0000 mg | ORAL_CAPSULE | Freq: Two times a day (BID) | ORAL | 0 refills | Status: DC

## 2017-10-21 NOTE — ED Notes (Signed)
Patient stated that she is still unable to void

## 2017-10-21 NOTE — ED Notes (Signed)
3rd attempted to get urine, pt unable to

## 2017-10-21 NOTE — ED Provider Notes (Signed)
MRN: [id number] DOB: June 19, 1941  Subjective:   Tina Waller is a 77 y.o. female presenting for 3 day history of lower abdominal pain, radiates to left flank side. Had 1 episode of diarrhea, noticed mucus in her stool, has decreased appetite. Has had a similar episode like this before, resolved with Activia as recommended by her PCP. She is hydrating with water and cranberry juice. Denies fever, n/v, bloody stools, dysuria, hematuria, painful defecation, confusion, chills. She has not taken her blood pressure medications in the past 2 days.    Current Facility-Administered Medications:  .  0.9 %  sodium chloride infusion, 500 mL, Intravenous, Continuous, Danis, Estill Cotta III, MD  Current Outpatient Medications:  .  allopurinol (ZYLOPRIM) 100 MG tablet, Take 1 tablet (100 mg total) by mouth daily., Disp: 90 tablet, Rfl: 3 .  amLODipine (NORVASC) 10 MG tablet, Take 1 tablet (10 mg total) by mouth daily., Disp: 90 tablet, Rfl: 3 .  aspirin EC 81 MG tablet, Take 81 mg by mouth daily., Disp: , Rfl:  .  furosemide (LASIX) 20 MG tablet, 1 tab twice a week as needed for swelling in legs and feet., Disp: 30 tablet, Rfl: 3 .  ibandronate (BONIVA) 150 MG tablet, Take 1 tablet (150 mg total) by mouth every 30 (thirty) days. Take with a full glass of water, on an empty stomach, and stay upright for 30 min., Disp: 3 tablet, Rfl: 3 .  metoprolol succinate (TOPROL-XL) 25 MG 24 hr tablet, Take 1 tablet (25 mg total) by mouth daily., Disp: 90 tablet, Rfl: 3 .  saccharomyces boulardii (FLORASTOR) 250 MG capsule, Take 1 capsule (250 mg total) by mouth 2 (two) times daily., Disp: 30 capsule, Rfl: 1 .  triamcinolone cream (KENALOG) 0.1 %, Apply 1 application topically 2 (two) times daily., Disp: 80 g, Rfl: 1    Allergies  Allergen Reactions  . Lipitor [Atorvastatin]     Past Medical History:  Diagnosis Date  . Anemia   . Anxiety   . Cataract   . Depression   . Diabetes mellitus without complication  ([place])    pt denies  . Gout   . Hypertension   . Stroke [facility]) 2008     Past Surgical History:  Procedure Laterality Date  . ABDOMINAL HYSTERECTOMY    . CATARACT EXTRACTION Left     Objective:   Vitals: BP (!) 159/62 (BP Location: Left Arm) Comment: did not take blood pressure medicine today  Pulse 96   Temp 98.5 F (36.9 C) (Oral)   Resp 18   SpO2 100%   Physical Exam  Constitutional: She is oriented to person, place, and time. She appears well-developed and well-nourished.  HENT:  Mouth/Throat: Oropharynx is clear and moist.  Cardiovascular: Normal rate, regular rhythm and intact distal pulses. Exam reveals no gallop and no friction rub.  No murmur heard. Pulmonary/Chest: No respiratory distress. She has no wheezes. She has no rales.  Abdominal: Soft. She exhibits no distension and no mass. Bowel sounds are increased. There is tenderness in the left lower quadrant. There is CVA tenderness (left).  Musculoskeletal: She exhibits no edema.  Neurological: She is alert and oriented to person, place, and time.  Skin: Skin is warm and dry. No rash noted. No erythema. No pallor.   Results for orders placed or performed during the hospital encounter of 10/21/17 (from the past 24 hour(s))  I-STAT, chem 8     Status: Abnormal   Collection Time: 10/21/17 11:32 AM  Result  Value Ref Range   Sodium 140 135 - 145 mmol/L   Potassium 3.5 3.5 - 5.1 mmol/L   Chloride 106 101 - 111 mmol/L   BUN 11 6 - 20 mg/dL   Creatinine, Ser 1.10 (H) 0.44 - 1.00 mg/dL   Glucose, Bld 106 (H) 65 - 99 mg/dL   Calcium, Ion 1.06 (L) 1.15 - 1.40 mmol/L   TCO2 22 22 - 32 mmol/L   Hemoglobin 12.6 12.0 - 15.0 g/dL   HCT 37.0 36.0 - 46.0 %   Dg Abd 2 Views  Result Date: 10/21/2017 CLINICAL DATA:  Lower abdominal soreness.  Pain. EXAM: ABDOMEN - 2 VIEW COMPARISON:  None. FINDINGS: The bowel gas pattern is normal. There is no evidence of free air. No radio-opaque calculi or other significant radiographic  abnormality is seen. IMPRESSION: Negative. Electronically Signed   By: Kerby Moors M.D.   On: 10/21/2017 12:02   Assessment and Plan :   Abdominal pain, left lower quadrant  Left flank pain  Decreased appetite  Constipation, unspecified constipation type  Case precepted with Dr. Mannie Stabile. Will manage for constipation with Miralax, docusate, hydration, dietary modifications. Discussed differential of cystitis, pyelonephritis, diverticulitis and dicussed ER and return-to-clinic precautions, patient verbalized understanding. Emphasized medical compliance for her HTN.    Jaynee Eagles, [place] 10/21/17 1243

## 2017-10-21 NOTE — ED Notes (Signed)
Patient still unable to void.

## 2017-10-21 NOTE — ED Triage Notes (Signed)
Onset Monday of feeling bad.  Soreness in left flank, abdominal soreness and has had diarrhea.

## 2017-10-21 NOTE — ED Notes (Signed)
Unable to void

## 2017-10-21 NOTE — Discharge Instructions (Addendum)
Please use Miralax for moderate to severe constipation. Take this once a day for the next 2-3 days. Please also start Colace (docusate) stool softener, twice a day for at least 1 week. If stools become loose, cut down to once a day for another week. If stools remain loose, cut back to 1 pill every other day for a third week. You can stop docusate thereafter and resume as needed for constipation.  To help reduce constipation and promote bowel health: 1. Drink at least 64 ounces of water each day 2. Eat plenty of fiber (fruits, vegetables, whole grains, legumes) 3. Be physically active or exercise including walking, jogging, swimming, yoga, etc. 4. For active constipation use a stool softener (docusate) or an osmotic laxative (like Miralax) each day, or as needed.

## 2017-11-05 ENCOUNTER — Encounter: Payer: Self-pay | Admitting: Internal Medicine

## 2017-11-05 ENCOUNTER — Ambulatory Visit: Payer: Medicare HMO | Attending: Internal Medicine | Admitting: Internal Medicine

## 2017-11-05 VITALS — BP 145/71 | HR 82 | Temp 98.0°F | Resp 16 | Wt 142.0 lb

## 2017-11-05 DIAGNOSIS — D649 Anemia, unspecified: Secondary | ICD-10-CM | POA: Diagnosis not present

## 2017-11-05 DIAGNOSIS — I129 Hypertensive chronic kidney disease with stage 1 through stage 4 chronic kidney disease, or unspecified chronic kidney disease: Secondary | ICD-10-CM | POA: Diagnosis not present

## 2017-11-05 DIAGNOSIS — N183 Chronic kidney disease, stage 3 unspecified: Secondary | ICD-10-CM

## 2017-11-05 DIAGNOSIS — Z8673 Personal history of transient ischemic attack (TIA), and cerebral infarction without residual deficits: Secondary | ICD-10-CM | POA: Insufficient documentation

## 2017-11-05 DIAGNOSIS — Z7982 Long term (current) use of aspirin: Secondary | ICD-10-CM | POA: Insufficient documentation

## 2017-11-05 DIAGNOSIS — F329 Major depressive disorder, single episode, unspecified: Secondary | ICD-10-CM | POA: Insufficient documentation

## 2017-11-05 DIAGNOSIS — Z79899 Other long term (current) drug therapy: Secondary | ICD-10-CM | POA: Diagnosis not present

## 2017-11-05 DIAGNOSIS — E785 Hyperlipidemia, unspecified: Secondary | ICD-10-CM | POA: Diagnosis not present

## 2017-11-05 DIAGNOSIS — R197 Diarrhea, unspecified: Secondary | ICD-10-CM | POA: Diagnosis not present

## 2017-11-05 DIAGNOSIS — R600 Localized edema: Secondary | ICD-10-CM

## 2017-11-05 DIAGNOSIS — E213 Hyperparathyroidism, unspecified: Secondary | ICD-10-CM | POA: Diagnosis not present

## 2017-11-05 DIAGNOSIS — Z90710 Acquired absence of both cervix and uterus: Secondary | ICD-10-CM | POA: Insufficient documentation

## 2017-11-05 DIAGNOSIS — M109 Gout, unspecified: Secondary | ICD-10-CM | POA: Diagnosis not present

## 2017-11-05 DIAGNOSIS — Z87891 Personal history of nicotine dependence: Secondary | ICD-10-CM | POA: Diagnosis not present

## 2017-11-05 DIAGNOSIS — E559 Vitamin D deficiency, unspecified: Secondary | ICD-10-CM | POA: Diagnosis not present

## 2017-11-05 DIAGNOSIS — I10 Essential (primary) hypertension: Secondary | ICD-10-CM | POA: Diagnosis not present

## 2017-11-05 DIAGNOSIS — D539 Nutritional anemia, unspecified: Secondary | ICD-10-CM

## 2017-11-05 DIAGNOSIS — Z888 Allergy status to other drugs, medicaments and biological substances status: Secondary | ICD-10-CM | POA: Insufficient documentation

## 2017-11-05 DIAGNOSIS — Z9889 Other specified postprocedural states: Secondary | ICD-10-CM | POA: Insufficient documentation

## 2017-11-05 MED ORDER — LISINOPRIL 5 MG PO TABS: 2.5000 mg | ORAL_TABLET | Freq: Every day | ORAL | 0 refills | Status: DC

## 2017-11-05 MED ORDER — FUROSEMIDE 20 MG PO TABS: 20.0000 mg | ORAL_TABLET | Freq: Every day | ORAL | 3 refills | Status: DC

## 2017-11-05 NOTE — Patient Instructions (Signed)
Stop Amlodipine.  Start Lisinopril 5 mg 1/2 tablet daily. Increase Furosemide to 20 mg daily. Limit salt intact.

## 2017-11-05 NOTE — Progress Notes (Addendum)
Patient ID: Tina Waller, female    DOB: [birth year]  MRN: [id number]  CC: Edema   Subjective: Tina Waller is a 77 y.o. female who presents for chronic ds management. Her concerns today include:  Patient with history of hypertension, hyperlipidemia, vitamin D deficiency, osteoporosis on Dexa scan done fall 2017, depression,CKD stage 3 (saw neph winter 2018), anemia (taking OTC iron once a day),CVA in 2008 and gout.  1.  LE edema: Main complaint today is swelling in the legs and feet.  She reports that the swelling in the feet has moved up into the legs.  Swelling that worses as day progresses even when she wears the compression socks.  Used to go to the [facility] in the fall of last year but discontinued going because her feet would be too swollen to get into her tennis shoes. -taking Furosemide QOD.  No SOB, orthopnea, or PND.  No chest pains  2.  HTN:  Compliant with Metoprolol and Amlodipine. Limits salt intake No CP/SOB  3.  Seen at [facility] 2 wks ago for sorness on LT side.  Told she was constipated.  Given Miralax. Pain has resolved.  Moving bowel okay since then. On previous visits with me, pt was having diarrhea. Eating 2 meals a day, this is her usual.    Patient Active Problem List   Diagnosis Date Noted  . CKD (chronic kidney disease) stage 3, GFR 30-59 ml/min (HCC) 04/17/2017  . Stasis dermatitis of both legs 04/17/2017  . Osteoporosis 12/22/2016  . Hx of gout 12/22/2016  . Estrogen deficiency 05/12/2016  . Vitamin D deficiency 05/12/2016  . Weight loss 05/12/2016  . HTN (hypertension) 06/12/2015  . History of CVA (cerebrovascular accident) 03/28/2015     Current Outpatient Medications on File Prior to Visit  Medication Sig Dispense Refill  . allopurinol (ZYLOPRIM) 100 MG tablet Take 1 tablet (100 mg total) by mouth daily. 90 tablet 3  . aspirin EC 81 MG tablet Take 81 mg by mouth daily.    Marland Kitchen docusate sodium (COLACE) 50 MG capsule Take 1 capsule (50 mg total) by  mouth 2 (two) times daily. 60 capsule 0  . ibandronate (BONIVA) 150 MG tablet Take 1 tablet (150 mg total) by mouth every 30 (thirty) days. Take with a full glass of water, on an empty stomach, and stay upright for 30 min. 3 tablet 3  . metoprolol succinate (TOPROL-XL) 25 MG 24 hr tablet Take 1 tablet (25 mg total) by mouth daily. 90 tablet 3  . polyethylene glycol (MIRALAX) packet Take 17 g by mouth daily as needed. 10 each 0  . saccharomyces boulardii (FLORASTOR) 250 MG capsule Take 1 capsule (250 mg total) by mouth 2 (two) times daily. 30 capsule 1  . triamcinolone cream (KENALOG) 0.1 % Apply 1 application topically 2 (two) times daily. 80 g 1   Current Facility-Administered Medications on File Prior to Visit  Medication Dose Route Frequency Provider Last Rate Last Dose  . 0.9 %  sodium chloride infusion  500 mL Intravenous Continuous Doran Stabler, MD        Allergies  Allergen Reactions  . Lipitor [Atorvastatin]     Social History   Socioeconomic History  . Marital status: Divorced    Spouse name: Not on file  . Number of children: Not on file  . Years of education: Not on file  . Highest education level: Not on file  Occupational History  . Not on file  Social  Needs  . Financial resource strain: Not on file  . Food insecurity:    Worry: Not on file    Inability: Not on file  . Transportation needs:    Medical: Not on file    Non-medical: Not on file  Tobacco Use  . Smoking status: Former Smoker    Last attempt to quit: 07/14/2006    Years since quitting: 11.3  . Smokeless tobacco: Never Used  Substance and Sexual Activity  . Alcohol use: Yes    Alcohol/week: 4.2 oz    Types: 7 Shots of liquor per week    Comment: wine and vodka  . Drug use: No  . Sexual activity: Not on file  Lifestyle  . Physical activity:    Days per week: Not on file    Minutes per session: Not on file  . Stress: Not on file  Relationships  . Social connections:    Talks on phone: Not  on file    Gets together: Not on file    Attends religious service: Not on file    Active member of club or organization: Not on file    Attends meetings of clubs or organizations: Not on file    Relationship status: Not on file  . Intimate partner violence:    Fear of current or ex partner: Not on file    Emotionally abused: Not on file    Physically abused: Not on file    Forced sexual activity: Not on file  Other Topics Concern  . Not on file  Social History Narrative  . Not on file    Family History  Problem Relation Age of Onset  . Heart disease Mother   . Osteoporosis Neg Hx     Past Surgical History:  Procedure Laterality Date  . ABDOMINAL HYSTERECTOMY    . CATARACT EXTRACTION Left     ROS: Review of Systems Negative except as above PHYSICAL EXAM: BP (!) 145/71   Pulse 82   Temp 98 F (36.7 C) (Oral)   Resp 16   Wt 142 lb (64.4 kg)   SpO2 95%   BMI 20.97 kg/m   Wt Readings from Last 3 Encounters:  11/05/17 142 lb (64.4 kg)  07/24/17 145 lb (65.8 kg)  04/17/17 145 lb 9.6 oz (66 kg)    Physical Exam  General appearance - alert, well appearing, and in no distress Mental status - alert, oriented to person, place, and time, normal mood, behavior, speech, dress, motor activity, and thought processes Neck - supple, no significant adenopathy Chest - clear to auscultation, no wheezes, rales or rhonchi, symmetric air entry Heart - normal rate, regular rhythm, normal S1, S2, no murmurs, rubs, clicks or gallops.  No JVD.  No presacral edema Extremities - 2+ edema in legs and feet.  RT leg slightly larger than LT. swelling is about two thirds up the lower leg below the knee  ASSESSMENT AND PLAN: 1. Essential hypertension Not at goal. Stop Norvasc to see if the lower extremity edema will get any better.  Start low-dose lisinopril instead.  Increase furosemide to 20 mg daily instead of every other day.  Limit salt. - CBC - Comprehensive metabolic panel -  furosemide (LASIX) 20 MG tablet; Take 1 tablet (20 mg total) by mouth daily.  Dispense: 90 tablet; Refill: 3 - lisinopril (PRINIVIL,ZESTRIL) 5 MG tablet; Take 0.5 tablets (2.5 mg total) by mouth daily.  Dispense: 30 tablet; Refill: 0  2. CKD (chronic kidney disease) stage 3,  GFR 30-59 ml/min (HCC) Check CMP today.  3. Lower extremity edema #1 above. - Brain natriuretic peptide - TSH  4. Hyperparathyroidism ([place]) - Parathyroid hormone, intact (no Ca)   Patient was given the opportunity to ask questions.  Patient verbalized understanding of the plan and was able to repeat key elements of the plan.   Addendum:  Pt with macrocytic indices on CBC.  Will add Vit B12 and Folate level.   Orders Placed This Encounter  Procedures  . CBC  . Comprehensive metabolic panel  . Brain natriuretic peptide  . TSH  . Parathyroid hormone, intact (no Ca)     Requested Prescriptions   Signed Prescriptions Disp Refills  . furosemide (LASIX) 20 MG tablet 90 tablet 3    Sig: Take 1 tablet (20 mg total) by mouth daily.  Marland Kitchen lisinopril (PRINIVIL,ZESTRIL) 5 MG tablet 30 tablet 0    Sig: Take 0.5 tablets (2.5 mg total) by mouth daily.    Return in about 3 weeks (around 11/26/2017).  Karle Plumber, MD, FACP

## 2017-11-06 ENCOUNTER — Telehealth: Payer: Self-pay | Admitting: Internal Medicine

## 2017-11-06 DIAGNOSIS — R600 Localized edema: Secondary | ICD-10-CM

## 2017-11-06 LAB — COMPREHENSIVE METABOLIC PANEL
A/G RATIO: 1.1 — AB (ref 1.2–2.2)
ALBUMIN: 3.8 g/dL (ref 3.5–4.8)
ALK PHOS: 171 IU/L — AB (ref 39–117)
ALT: 9 IU/L (ref 0–32)
AST: 16 IU/L (ref 0–40)
BUN / CREAT RATIO: 7 — AB (ref 12–28)
BUN: 8 mg/dL (ref 8–27)
Bilirubin Total: 0.4 mg/dL (ref 0.0–1.2)
CO2: 17 mmol/L — AB (ref 20–29)
CREATININE: 1.11 mg/dL — AB (ref 0.57–1.00)
Calcium: 9 mg/dL (ref 8.7–10.3)
Chloride: 102 mmol/L (ref 96–106)
GFR calc Af Amer: 57 mL/min/{1.73_m2} — ABNORMAL LOW (ref >59)
GFR, EST NON AFRICAN AMERICAN: 49 mL/min/{1.73_m2} — AB (ref >59)
GLOBULIN, TOTAL: 3.6 g/dL (ref 1.5–4.5)
Glucose: 97 mg/dL (ref 65–99)
Potassium: 4.1 mmol/L (ref 3.5–5.2)
SODIUM: 142 mmol/L (ref 134–144)
Total Protein: 7.4 g/dL (ref 6.0–8.5)

## 2017-11-06 LAB — BRAIN NATRIURETIC PEPTIDE: BNP: 135.5 pg/mL — AB (ref 0.0–100.0)

## 2017-11-06 LAB — TSH: TSH: 1.1 u[IU]/mL (ref 0.450–4.500)

## 2017-11-06 LAB — CBC
HEMATOCRIT: 37.3 % (ref 34.0–46.6)
HEMOGLOBIN: 12.9 g/dL (ref 11.1–15.9)
MCH: 35.2 pg — ABNORMAL HIGH (ref 26.6–33.0)
MCHC: 34.6 g/dL (ref 31.5–35.7)
MCV: 102 fL — AB (ref 79–97)
Platelets: 443 10*3/uL — ABNORMAL HIGH (ref 150–379)
RBC: 3.66 x10E6/uL — ABNORMAL LOW (ref 3.77–5.28)
RDW: 16.7 % — ABNORMAL HIGH (ref 12.3–15.4)
WBC: 5.5 10*3/uL (ref 3.4–10.8)

## 2017-11-06 LAB — PARATHYROID HORMONE, INTACT (NO CA): PTH: 107 pg/mL — ABNORMAL HIGH (ref 15–65)

## 2017-11-06 NOTE — Addendum Note (Signed)
Addended by: Karle Plumber B on: 11/06/2017 08:47 AM   Modules accepted: Orders

## 2017-11-06 NOTE — Telephone Encounter (Signed)
Results for orders placed or performed in visit on 11/05/17  CBC  Result Value Ref Range   WBC 5.5 3.4 - 10.8 x10E3/uL   RBC 3.66 (L) 3.77 - 5.28 x10E6/uL   Hemoglobin 12.9 11.1 - 15.9 g/dL   Hematocrit 37.3 34.0 - 46.6 %   MCV 102 (H) 79 - 97 fL   MCH 35.2 (H) 26.6 - 33.0 pg   MCHC 34.6 31.5 - 35.7 g/dL   RDW 16.7 (H) 12.3 - 15.4 %   Platelets 443 (H) 150 - 379 x10E3/uL  Comprehensive metabolic panel  Result Value Ref Range   Glucose 97 65 - 99 mg/dL   BUN 8 8 - 27 mg/dL   Creatinine, Ser 1.11 (H) 0.57 - 1.00 mg/dL   GFR calc non Af Amer 49 (L) >59 mL/min/1.73   GFR calc Af Amer 57 (L) >59 mL/min/1.73   BUN/Creatinine Ratio 7 (L) 12 - 28   Sodium 142 134 - 144 mmol/L   Potassium 4.1 3.5 - 5.2 mmol/L   Chloride 102 96 - 106 mmol/L   CO2 17 (L) 20 - 29 mmol/L   Calcium 9.0 8.7 - 10.3 mg/dL   Total Protein 7.4 6.0 - 8.5 g/dL   Albumin 3.8 3.5 - 4.8 g/dL   Globulin, Total 3.6 1.5 - 4.5 g/dL   Albumin/Globulin Ratio 1.1 (L) 1.2 - 2.2   Bilirubin Total 0.4 0.0 - 1.2 mg/dL   Alkaline Phosphatase 171 (H) 39 - 117 IU/L   AST 16 0 - 40 IU/L   ALT 9 0 - 32 IU/L  Brain natriuretic peptide  Result Value Ref Range   BNP 135.5 (H) 0.0 - 100.0 pg/mL  TSH  Result Value Ref Range   TSH 1.100 0.450 - 4.500 uIU/mL  Parathyroid hormone, intact (no Ca)  Result Value Ref Range   PTH 107 (H) 15 - 65 pg/mL   Pt with mild elev in BNP.  Will get echo She has hyperparathyroidism.  Up to date with BMD study Has Macrocytic indices on CBC. Will add Vit B12/Folate.

## 2017-11-09 NOTE — Telephone Encounter (Signed)
Contacted pt and gave appointment info for her echo. Pt is scheduled for her echo on Nov 13, 2017 @3pm  @[facility]. Pt is to arrive @245pm .   Pt is aware and doesn't have any questions or concerns

## 2017-11-10 LAB — VITAMIN B12: VITAMIN B 12: 267 pg/mL (ref 232–1245)

## 2017-11-10 LAB — SPECIMEN STATUS REPORT

## 2017-11-11 ENCOUNTER — Telehealth: Payer: Self-pay | Admitting: Internal Medicine

## 2017-11-11 NOTE — Telephone Encounter (Signed)
Must be pre-serviced for insurance for upcoming imaging. Please fu at your earliest convenience.

## 2017-11-12 ENCOUNTER — Telehealth: Payer: Self-pay

## 2017-11-12 ENCOUNTER — Telehealth: Payer: Self-pay | Admitting: Internal Medicine

## 2017-11-12 NOTE — Telephone Encounter (Signed)
Contacted pt to go over lab results pt is aware and doesn't have any questions or concerns 

## 2017-11-12 NOTE — Telephone Encounter (Signed)
Melanie called requesting for a pre-service for patient.

## 2017-11-12 NOTE — Telephone Encounter (Signed)
Notified us department at [phone] with approval number.

## 2017-11-12 NOTE — Telephone Encounter (Signed)
Prior Authorization approved for:  case number: [id number] [place] authorization number: [id number]

## 2017-11-12 NOTE — Telephone Encounter (Signed)
Asked Tina Waller to work on Prior [employer]. Pt is scheduled for echo for tomorrow

## 2017-11-13 ENCOUNTER — Ambulatory Visit (HOSPITAL_COMMUNITY)
Admission: RE | Admit: 2017-11-13 | Discharge: 2017-11-13 | Disposition: A | Discharge status: Home / Self Care | Payer: Medicare HMO | Source: Ambulatory Visit | Attending: Internal Medicine | Admitting: Internal Medicine

## 2017-11-13 DIAGNOSIS — R600 Localized edema: Secondary | ICD-10-CM | POA: Diagnosis not present

## 2017-11-13 DIAGNOSIS — I361 Nonrheumatic tricuspid (valve) insufficiency: Secondary | ICD-10-CM | POA: Insufficient documentation

## 2017-11-13 DIAGNOSIS — I10 Essential (primary) hypertension: Secondary | ICD-10-CM | POA: Insufficient documentation

## 2017-11-13 DIAGNOSIS — I272 Pulmonary hypertension, unspecified: Secondary | ICD-10-CM | POA: Insufficient documentation

## 2017-11-13 LAB — FOLATE: Folate: 4.4 ng/mL (ref >3.0)

## 2017-11-13 LAB — SPECIMEN STATUS REPORT

## 2017-11-13 NOTE — Progress Notes (Signed)
  Echocardiogram 2D Echocardiogram has been performed.  Tina Waller 11/13/2017, 3:36 PM

## 2017-11-23 ENCOUNTER — Other Ambulatory Visit: Payer: Self-pay

## 2017-11-23 ENCOUNTER — Telehealth: Payer: Self-pay | Admitting: Internal Medicine

## 2017-11-23 DIAGNOSIS — I10 Essential (primary) hypertension: Secondary | ICD-10-CM

## 2017-11-23 MED ORDER — LISINOPRIL 5 MG PO TABS: 2.5000 mg | ORAL_TABLET | Freq: Every day | ORAL | 0 refills | Status: DC

## 2017-11-23 NOTE — Telephone Encounter (Signed)
Patient called and stated that the [facility] on [address], stated they did not have her lisinopril medication. Please fu at your earliest convenience.

## 2017-11-23 NOTE — Telephone Encounter (Signed)
rx has been sent 

## 2017-12-10 ENCOUNTER — Ambulatory Visit: Payer: Medicare HMO | Attending: Internal Medicine | Admitting: Internal Medicine

## 2017-12-10 ENCOUNTER — Encounter: Payer: Self-pay | Admitting: Internal Medicine

## 2017-12-10 VITALS — BP 153/80 | HR 76 | Temp 97.9°F | Resp 16 | Wt 135.2 lb

## 2017-12-10 DIAGNOSIS — I10 Essential (primary) hypertension: Secondary | ICD-10-CM | POA: Diagnosis not present

## 2017-12-10 DIAGNOSIS — I129 Hypertensive chronic kidney disease with stage 1 through stage 4 chronic kidney disease, or unspecified chronic kidney disease: Secondary | ICD-10-CM | POA: Diagnosis not present

## 2017-12-10 DIAGNOSIS — Z7982 Long term (current) use of aspirin: Secondary | ICD-10-CM | POA: Insufficient documentation

## 2017-12-10 DIAGNOSIS — M810 Age-related osteoporosis without current pathological fracture: Secondary | ICD-10-CM | POA: Diagnosis not present

## 2017-12-10 DIAGNOSIS — D649 Anemia, unspecified: Secondary | ICD-10-CM | POA: Diagnosis not present

## 2017-12-10 DIAGNOSIS — Z8673 Personal history of transient ischemic attack (TIA), and cerebral infarction without residual deficits: Secondary | ICD-10-CM | POA: Insufficient documentation

## 2017-12-10 DIAGNOSIS — I272 Pulmonary hypertension, unspecified: Secondary | ICD-10-CM | POA: Diagnosis not present

## 2017-12-10 DIAGNOSIS — E538 Deficiency of other specified B group vitamins: Secondary | ICD-10-CM | POA: Diagnosis not present

## 2017-12-10 DIAGNOSIS — F329 Major depressive disorder, single episode, unspecified: Secondary | ICD-10-CM | POA: Insufficient documentation

## 2017-12-10 DIAGNOSIS — Z87891 Personal history of nicotine dependence: Secondary | ICD-10-CM | POA: Insufficient documentation

## 2017-12-10 DIAGNOSIS — M109 Gout, unspecified: Secondary | ICD-10-CM | POA: Diagnosis not present

## 2017-12-10 DIAGNOSIS — E559 Vitamin D deficiency, unspecified: Secondary | ICD-10-CM | POA: Insufficient documentation

## 2017-12-10 DIAGNOSIS — Z79899 Other long term (current) drug therapy: Secondary | ICD-10-CM | POA: Insufficient documentation

## 2017-12-10 DIAGNOSIS — R600 Localized edema: Secondary | ICD-10-CM | POA: Diagnosis not present

## 2017-12-10 DIAGNOSIS — I872 Venous insufficiency (chronic) (peripheral): Secondary | ICD-10-CM | POA: Diagnosis not present

## 2017-12-10 DIAGNOSIS — N183 Chronic kidney disease, stage 3 (moderate): Secondary | ICD-10-CM | POA: Diagnosis not present

## 2017-12-10 MED ORDER — FUROSEMIDE 40 MG PO TABS: 40.0000 mg | ORAL_TABLET | Freq: Every day | ORAL | 3 refills | Status: DC

## 2017-12-10 MED ORDER — LOSARTAN POTASSIUM 25 MG PO TABS: 25.0000 mg | ORAL_TABLET | Freq: Every day | ORAL | 3 refills | Status: DC

## 2017-12-10 NOTE — Progress Notes (Signed)
Patient ID: Tina Waller, female    DOB: 16-Jul-1940  MRN: [id number]  CC: Foot Swelling   Subjective: Tina Waller is a 77 y.o. female who presents for 1 mth f/u on LE edema and HTN Her concerns today include:  Patient with history of HTN, HL, vit D deficiency, osteoporosis on Dexa scan done fall 2017, depression,CKD stage 3 (saw neph winter 2018), anemia (taking OTC iron once a day),CVA in 2008 and gout.  On last visit, Norvasc was d/c.  Furosemide increased to 20 mg daily. BNP mildly elevated at 135.5, TSH nl, PTH, Creat 1.1 with eGFR 57 elevated.  Echo revealed  Nl EF with mild elev of RT vent systolic pressure c/w pul HTN  LE edema/HTN: checking BP QOD.  Gives range 150s/90s She is Limiting salt Swelling in legs has decreased some with d/c Norvasc and daily Furosemide.  She did not take Furosemide as yet for today because she knew she would be coming out for this appt Swelling less in the mornings She did not take fluid pill as yet today. Some SOB in walking less than 1 block.  No PND/orthopea.  Sleeps on 1 pillow + dry cough x 2-3 wks.  Worse at nights  CBC with elev MCV: Glasses 3 glasses wine sometimes on wkends or at nights if she is watching an interesting program or sports event  Has form for Handicap sticker.  Unable to walk long distance post CVA in 2008  Patient Active Problem List   Diagnosis Date Noted  . CKD (chronic kidney disease) stage 3, GFR 30-59 ml/min (HCC) 04/17/2017  . Stasis dermatitis of both legs 04/17/2017  . Osteoporosis 12/22/2016  . Hx of gout 12/22/2016  . Estrogen deficiency 05/12/2016  . Vitamin D deficiency 05/12/2016  . Weight loss 05/12/2016  . HTN (hypertension) 06/12/2015  . History of CVA (cerebrovascular accident) 03/28/2015     Current Outpatient Medications on File Prior to Visit  Medication Sig Dispense Refill  . allopurinol (ZYLOPRIM) 100 MG tablet Take 1 tablet (100 mg total) by mouth daily. 90 tablet 3  . aspirin  EC 81 MG tablet Take 81 mg by mouth daily.    Marland Kitchen docusate sodium (COLACE) 50 MG capsule Take 1 capsule (50 mg total) by mouth 2 (two) times daily. 60 capsule 0  . ibandronate (BONIVA) 150 MG tablet Take 1 tablet (150 mg total) by mouth every 30 (thirty) days. Take with a full glass of water, on an empty stomach, and stay upright for 30 min. 3 tablet 3  . metoprolol succinate (TOPROL-XL) 25 MG 24 hr tablet Take 1 tablet (25 mg total) by mouth daily. 90 tablet 3  . polyethylene glycol (MIRALAX) packet Take 17 g by mouth daily as needed. 10 each 0  . saccharomyces boulardii (FLORASTOR) 250 MG capsule Take 1 capsule (250 mg total) by mouth 2 (two) times daily. 30 capsule 1  . triamcinolone cream (KENALOG) 0.1 % Apply 1 application topically 2 (two) times daily. 80 g 1   Current Facility-Administered Medications on File Prior to Visit  Medication Dose Route Frequency Provider Last Rate Last Dose  . 0.9 %  sodium chloride infusion  500 mL Intravenous Continuous Doran Stabler, MD        Allergies  Allergen Reactions  . Lipitor [Atorvastatin]     Social History   Socioeconomic History  . Marital status: Divorced    Spouse name: Not on file  . Number of children: Not on  file  . Years of education: Not on file  . Highest education level: Not on file  Occupational History  . Not on file  Social Needs  . Financial resource strain: Not on file  . Food insecurity:    Worry: Not on file    Inability: Not on file  . Transportation needs:    Medical: Not on file    Non-medical: Not on file  Tobacco Use  . Smoking status: Former Smoker    Last attempt to quit: 07/14/2006    Years since quitting: 11.4  . Smokeless tobacco: Never Used  Substance and Sexual Activity  . Alcohol use: Yes    Alcohol/week: 4.2 oz    Types: 7 Shots of liquor per week    Comment: wine and vodka  . Drug use: No  . Sexual activity: Not on file  Lifestyle  . Physical activity:    Days per week: Not on file     Minutes per session: Not on file  . Stress: Not on file  Relationships  . Social connections:    Talks on phone: Not on file    Gets together: Not on file    Attends religious service: Not on file    Active member of club or organization: Not on file    Attends meetings of clubs or organizations: Not on file    Relationship status: Not on file  . Intimate partner violence:    Fear of current or ex partner: Not on file    Emotionally abused: Not on file    Physically abused: Not on file    Forced sexual activity: Not on file  Other Topics Concern  . Not on file  Social History Narrative  . Not on file    Family History  Problem Relation Age of Onset  . Heart disease Mother   . Osteoporosis Neg Hx     Past Surgical History:  Procedure Laterality Date  . ABDOMINAL HYSTERECTOMY    . CATARACT EXTRACTION Left     ROS: Review of Systems Neg except as stated above  PHYSICAL EXAM: BP (!) 153/80   Pulse 76   Temp 97.9 F (36.6 C) (Oral)   Resp 16   Wt 135 lb 3.2 oz (61.3 kg)   SpO2 94%   BMI 19.97 kg/m   BP 148/70 Lt arm Physical Exam  General appearance - alert, well appearing, elderly AAF and in no distress Mental status - normal mood, behavior, speech, dress, motor activity, and thought processes Mouth -moist oral mucosa Neck - supple, no significant adenopathy Chest -fine crackles at the bases Heart - normal rate, regular rhythm, normal S1, S2, no murmurs, rubs, clicks or gallops.  No JVD Extremities - dec in edema in lower 1/3 legs but pedal edema looks unchanged     Results for orders placed or performed in visit on 11/05/17  CBC  Result Value Ref Range   WBC 5.5 3.4 - 10.8 x10E3/uL   RBC 3.66 (L) 3.77 - 5.28 x10E6/uL   Hemoglobin 12.9 11.1 - 15.9 g/dL   Hematocrit 37.3 34.0 - 46.6 %   MCV 102 (H) 79 - 97 fL   MCH 35.2 (H) 26.6 - 33.0 pg   MCHC 34.6 31.5 - 35.7 g/dL   RDW 16.7 (H) 12.3 - 15.4 %   Platelets 443 (H) 150 - 379 x10E3/uL  Comprehensive  metabolic panel  Result Value Ref Range   Glucose 97 65 - 99 mg/dL   BUN  8 8 - 27 mg/dL   Creatinine, Ser 1.11 (H) 0.57 - 1.00 mg/dL   GFR calc non Af Amer 49 (L) >59 mL/min/1.73   GFR calc Af Amer 57 (L) >59 mL/min/1.73   BUN/Creatinine Ratio 7 (L) 12 - 28   Sodium 142 134 - 144 mmol/L   Potassium 4.1 3.5 - 5.2 mmol/L   Chloride 102 96 - 106 mmol/L   CO2 17 (L) 20 - 29 mmol/L   Calcium 9.0 8.7 - 10.3 mg/dL   Total Protein 7.4 6.0 - 8.5 g/dL   Albumin 3.8 3.5 - 4.8 g/dL   Globulin, Total 3.6 1.5 - 4.5 g/dL   Albumin/Globulin Ratio 1.1 (L) 1.2 - 2.2   Bilirubin Total 0.4 0.0 - 1.2 mg/dL   Alkaline Phosphatase 171 (H) 39 - 117 IU/L   AST 16 0 - 40 IU/L   ALT 9 0 - 32 IU/L  Brain natriuretic peptide  Result Value Ref Range   BNP 135.5 (H) 0.0 - 100.0 pg/mL  TSH  Result Value Ref Range   TSH 1.100 0.450 - 4.500 uIU/mL  Parathyroid hormone, intact (no Ca)  Result Value Ref Range   PTH 107 (H) 15 - 65 pg/mL  Vitamin B12  Result Value Ref Range   Vitamin B-12 267 232 - 1,245 pg/mL  Specimen status report  Result Value Ref Range   specimen status report Comment   Folate  Result Value Ref Range   Folate 4.4 >3.0 ng/mL  Specimen status report  Result Value Ref Range   specimen status report Comment      ASSESSMENT AND PLAN: 1. Essential hypertension 2. Edema, lower extremity 3. Pulmonary hypertension, mild (HCC) -I think the cough she is having due to Lisinopril.  Will change to Cozaar She does have some fine crackles heard at lung base today so will inc dose of Furosemide.  However, I think venous stasis/insuff is significant contributing factor.  Encouraged her to use her compression stockings more consistently.  To consider cardiology referral for pulHTN -close f/u in 2 wks  - losartan (COZAAR) 25 MG tablet; Take 1 tablet (25 mg total) by mouth daily.  Dispense: 30 tablet; Refill: 3 - furosemide (LASIX) 40 MG tablet; Take 1 tablet (40 mg total) by mouth daily.  Dispense:  30 tablet; Refill: 3   4. Vitamin B 12 deficiency Advise to purchase Vit B 12 1000 mcg OTC and take 1 daily.  Written instructions provided also.     Patient was given the opportunity to ask questions.  Patient verbalized understanding of the plan and was able to repeat key elements of the plan.   No orders of the defined types were placed in this encounter.    Requested Prescriptions   Signed Prescriptions Disp Refills  . losartan (COZAAR) 25 MG tablet 30 tablet 3    Sig: Take 1 tablet (25 mg total) by mouth daily.  . furosemide (LASIX) 40 MG tablet 30 tablet 3    Sig: Take 1 tablet (40 mg total) by mouth daily.    Return in about 2 weeks (around 12/24/2017).  Karle Plumber, MD, FACP

## 2017-12-10 NOTE — Patient Instructions (Addendum)
Stop Lisinopril this may be causing cough.    Start Losartan 25 mg daily instead.   Increase the fluid pil, Furosemide to 40 mg daily.  Please wear your compression stockings daily.  Please bring all medications with you on next visit.  Purchase Vitamin B 12 1000 mcg supplement over the counter and take one daily.

## 2017-12-11 DIAGNOSIS — I272 Pulmonary hypertension, unspecified: Secondary | ICD-10-CM | POA: Insufficient documentation

## 2017-12-11 DIAGNOSIS — E538 Deficiency of other specified B group vitamins: Secondary | ICD-10-CM | POA: Insufficient documentation

## 2017-12-24 ENCOUNTER — Encounter: Payer: Self-pay | Admitting: Internal Medicine

## 2017-12-24 ENCOUNTER — Ambulatory Visit: Payer: Medicare HMO | Attending: Internal Medicine | Admitting: Internal Medicine

## 2017-12-24 VITALS — BP 130/74 | HR 92 | Temp 97.9°F | Resp 16 | Wt 133.4 lb

## 2017-12-24 DIAGNOSIS — R600 Localized edema: Secondary | ICD-10-CM | POA: Diagnosis not present

## 2017-12-24 DIAGNOSIS — Z8249 Family history of ischemic heart disease and other diseases of the circulatory system: Secondary | ICD-10-CM | POA: Diagnosis not present

## 2017-12-24 DIAGNOSIS — I272 Pulmonary hypertension, unspecified: Secondary | ICD-10-CM | POA: Diagnosis not present

## 2017-12-24 DIAGNOSIS — Z90710 Acquired absence of both cervix and uterus: Secondary | ICD-10-CM | POA: Diagnosis not present

## 2017-12-24 DIAGNOSIS — M109 Gout, unspecified: Secondary | ICD-10-CM | POA: Insufficient documentation

## 2017-12-24 DIAGNOSIS — Z9889 Other specified postprocedural states: Secondary | ICD-10-CM | POA: Insufficient documentation

## 2017-12-24 DIAGNOSIS — Z681 Body mass index (BMI) 19 or less, adult: Secondary | ICD-10-CM | POA: Diagnosis not present

## 2017-12-24 DIAGNOSIS — Z8673 Personal history of transient ischemic attack (TIA), and cerebral infarction without residual deficits: Secondary | ICD-10-CM | POA: Diagnosis not present

## 2017-12-24 DIAGNOSIS — R634 Abnormal weight loss: Secondary | ICD-10-CM | POA: Diagnosis not present

## 2017-12-24 DIAGNOSIS — R609 Edema, unspecified: Secondary | ICD-10-CM | POA: Diagnosis not present

## 2017-12-24 DIAGNOSIS — I129 Hypertensive chronic kidney disease with stage 1 through stage 4 chronic kidney disease, or unspecified chronic kidney disease: Secondary | ICD-10-CM | POA: Insufficient documentation

## 2017-12-24 DIAGNOSIS — I10 Essential (primary) hypertension: Secondary | ICD-10-CM

## 2017-12-24 DIAGNOSIS — N183 Chronic kidney disease, stage 3 (moderate): Secondary | ICD-10-CM | POA: Insufficient documentation

## 2017-12-24 DIAGNOSIS — Z79899 Other long term (current) drug therapy: Secondary | ICD-10-CM | POA: Diagnosis not present

## 2017-12-24 DIAGNOSIS — F329 Major depressive disorder, single episode, unspecified: Secondary | ICD-10-CM | POA: Insufficient documentation

## 2017-12-24 DIAGNOSIS — Z87891 Personal history of nicotine dependence: Secondary | ICD-10-CM | POA: Diagnosis not present

## 2017-12-24 DIAGNOSIS — Z7982 Long term (current) use of aspirin: Secondary | ICD-10-CM | POA: Insufficient documentation

## 2017-12-24 NOTE — Progress Notes (Signed)
Patient ID: Tina Waller, female    DOB: 1941/03/22  MRN: [id number]  CC: Follow-up   Subjective: Tina Waller is a 77 y.o. female who presents for 2 wks f/u Her concerns today include:  Patient with history of HTN, HL, vit D deficiency, osteoporosis on Dexascandone fall 2017, depression,CKD stage 3 (saw neph winter 2018), anemia (taking OTC iron once a day),CVA in 2008 and gout.  HTN/mild pulmonary hypertension/lower extremity edema: On last visit we increase furosemide to 40 mg daily and advised patient to use compression socks.  Lisinopril was discontinued due to cough.  Cough has resolved.  She was placed on Cozaar instead which she is taking consistently.   -Lower extremity edema has significantly decreased with increased dose of Lasix.   No CP/SOB.  Sleeps on 1 pillow. Now that swelling in legs a lot less, and she can get shoes on, she may get back to [facility] to walk  Concern about wgh loss.  Loss 2 lbs since last visit Reports that she eats very light for BF.  Had bananna and cup tea this a.m.  Usually has a sandwich for lunch. Largest meal is supper. She is up to date with colon CA and breast CA screen.  Last TSH in 10/2017 was nl. Use to have BMs 5 x a day, now just twice a day with formed stools.  No blood in stools.  No palpitations Cough resolved with d/c Lisinopril on last visit She has cut back to 1 glass wine a few times a wk  Patient Active Problem List   Diagnosis Date Noted  . Vitamin B 12 deficiency 12/11/2017  . Pulmonary hypertension, mild ([place]) 12/11/2017  . CKD (chronic kidney disease) stage 3, GFR 30-59 ml/min (HCC) 04/17/2017  . Stasis dermatitis of both legs 04/17/2017  . Osteoporosis 12/22/2016  . Hx of gout 12/22/2016  . Estrogen deficiency 05/12/2016  . Vitamin D deficiency 05/12/2016  . Weight loss 05/12/2016  . HTN (hypertension) 06/12/2015  . History of CVA (cerebrovascular accident) 03/28/2015     Current Outpatient Medications on  File Prior to Visit  Medication Sig Dispense Refill  . allopurinol (ZYLOPRIM) 100 MG tablet Take 1 tablet (100 mg total) by mouth daily. 90 tablet 3  . aspirin EC 81 MG tablet Take 81 mg by mouth daily.    Marland Kitchen docusate sodium (COLACE) 50 MG capsule Take 1 capsule (50 mg total) by mouth 2 (two) times daily. 60 capsule 0  . furosemide (LASIX) 40 MG tablet Take 1 tablet (40 mg total) by mouth daily. 30 tablet 3  . ibandronate (BONIVA) 150 MG tablet Take 1 tablet (150 mg total) by mouth every 30 (thirty) days. Take with a full glass of water, on an empty stomach, and stay upright for 30 min. 3 tablet 3  . losartan (COZAAR) 25 MG tablet Take 1 tablet (25 mg total) by mouth daily. 30 tablet 3  . metoprolol succinate (TOPROL-XL) 25 MG 24 hr tablet Take 1 tablet (25 mg total) by mouth daily. 90 tablet 3  . polyethylene glycol (MIRALAX) packet Take 17 g by mouth daily as needed. 10 each 0  . saccharomyces boulardii (FLORASTOR) 250 MG capsule Take 1 capsule (250 mg total) by mouth 2 (two) times daily. 30 capsule 1  . triamcinolone cream (KENALOG) 0.1 % Apply 1 application topically 2 (two) times daily. 80 g 1   Current Facility-Administered Medications on File Prior to Visit  Medication Dose Route Frequency Provider Last Rate Last  Dose  . 0.9 %  sodium chloride infusion  500 mL Intravenous Continuous Doran Stabler, MD        Allergies  Allergen Reactions  . Lipitor [Atorvastatin]     Social History   Socioeconomic History  . Marital status: Divorced    Spouse name: Not on file  . Number of children: Not on file  . Years of education: Not on file  . Highest education level: Not on file  Occupational History  . Not on file  Social Needs  . Financial resource strain: Not on file  . Food insecurity:    Worry: Not on file    Inability: Not on file  . Transportation needs:    Medical: Not on file    Non-medical: Not on file  Tobacco Use  . Smoking status: Former Smoker    Last attempt  to quit: 07/14/2006    Years since quitting: 11.4  . Smokeless tobacco: Never Used  Substance and Sexual Activity  . Alcohol use: Yes    Alcohol/week: 4.2 oz    Types: 7 Shots of liquor per week    Comment: wine and vodka  . Drug use: No  . Sexual activity: Not on file  Lifestyle  . Physical activity:    Days per week: Not on file    Minutes per session: Not on file  . Stress: Not on file  Relationships  . Social connections:    Talks on phone: Not on file    Gets together: Not on file    Attends religious service: Not on file    Active member of club or organization: Not on file    Attends meetings of clubs or organizations: Not on file    Relationship status: Not on file  . Intimate partner violence:    Fear of current or ex partner: Not on file    Emotionally abused: Not on file    Physically abused: Not on file    Forced sexual activity: Not on file  Other Topics Concern  . Not on file  Social History Narrative  . Not on file    Family History  Problem Relation Age of Onset  . Heart disease Mother   . Osteoporosis Neg Hx     Past Surgical History:  Procedure Laterality Date  . ABDOMINAL HYSTERECTOMY    . CATARACT EXTRACTION Left     ROS: Review of Systems Neg except as above  PHYSICAL EXAM: BP 130/74   Pulse 92   Temp 97.9 F (36.6 C) (Oral)   Resp 16   Wt 133 lb 6.4 oz (60.5 kg)   SpO2 96%   BMI 19.70 kg/m   Wt Readings from Last 3 Encounters:  12/24/17 133 lb 6.4 oz (60.5 kg)  12/10/17 135 lb 3.2 oz (61.3 kg)  11/05/17 142 lb (64.4 kg)    Physical Exam  General appearance - alert, well appearing, and in no distress Mental status - normal mood, behavior, speech, dress, motor activity, and thought processes Mouth - mucous membranes moist, pharynx normal without lesions Neck - supple, no significant adenopathy Chest - clear to auscultation, no wheezes, rales or rhonchi, symmetric air entry Heart - normal rate, regular rhythm, normal S1, S2, no  murmurs, rubs, clicks or gallops Extremities -edema in lower extremities has significantly improved.  Trace edema in the lower legs.  Trace to 1+ edema in feet.  She is able to wear her sandals today.  Lab Results  Component  Value Date   WBC 5.5 11/05/2017   HGB 12.9 11/05/2017   HCT 37.3 11/05/2017   MCV 102 (H) 11/05/2017   PLT 443 (H) 11/05/2017   Lab Results  Component Value Date   TSH 1.100 11/05/2017    ASSESSMENT AND PLAN: 1. Essential hypertension 2. Edema, lower extremity 3. Pulmonary hypertension, mild (HCC) At goal.  Continue Cozaar and furosemide at current doses.  Continue to limit salt. Since she still has some venous stasis changes in the legs I advised her to use compression socks during the day  4. Weight loss I think part of the weight loss was fluid  loss.  Looking over her weight for the past year looks like she resided in the 130s about a year ago with gradual increase into the 40s.  She is now back into the 30s.  Advised patient to observe for now.  If weight loss continues she will follow-up for further evaluation.  Encourage her to drink boost or Ensure to supplement meals when she is not able to eat a full meal   Patient was given the opportunity to ask questions.  Patient verbalized understanding of the plan and was able to repeat key elements of the plan.   No orders of the defined types were placed in this encounter.    Requested Prescriptions    No prescriptions requested or ordered in this encounter    No follow-ups on file.  Karle Plumber, MD, FACP

## 2017-12-24 NOTE — Patient Instructions (Addendum)
Your blood pressure is good. Continue to limit salt in the foods.   Wear your compression socks.  Monitor your weight.  If you continue to lose, please follow up sooner than 3 months.  Try to supplement meals with Ensure.

## 2018-01-28 DIAGNOSIS — D649 Anemia, unspecified: Secondary | ICD-10-CM | POA: Diagnosis not present

## 2018-01-28 DIAGNOSIS — E213 Hyperparathyroidism, unspecified: Secondary | ICD-10-CM | POA: Diagnosis not present

## 2018-01-28 DIAGNOSIS — N281 Cyst of kidney, acquired: Secondary | ICD-10-CM | POA: Diagnosis not present

## 2018-01-28 DIAGNOSIS — N183 Chronic kidney disease, stage 3 (moderate): Secondary | ICD-10-CM | POA: Diagnosis not present

## 2018-01-28 DIAGNOSIS — E872 Acidosis: Secondary | ICD-10-CM | POA: Diagnosis not present

## 2018-01-28 DIAGNOSIS — I129 Hypertensive chronic kidney disease with stage 1 through stage 4 chronic kidney disease, or unspecified chronic kidney disease: Secondary | ICD-10-CM | POA: Diagnosis not present

## 2018-01-29 ENCOUNTER — Other Ambulatory Visit: Payer: Self-pay | Admitting: Nephrology

## 2018-01-29 DIAGNOSIS — N183 Chronic kidney disease, stage 3 unspecified: Secondary | ICD-10-CM

## 2018-01-29 DIAGNOSIS — I129 Hypertensive chronic kidney disease with stage 1 through stage 4 chronic kidney disease, or unspecified chronic kidney disease: Secondary | ICD-10-CM

## 2018-01-29 DIAGNOSIS — E872 Acidosis, unspecified: Secondary | ICD-10-CM

## 2018-01-29 DIAGNOSIS — N281 Cyst of kidney, acquired: Secondary | ICD-10-CM

## 2018-01-29 DIAGNOSIS — D649 Anemia, unspecified: Secondary | ICD-10-CM

## 2018-01-29 DIAGNOSIS — E213 Hyperparathyroidism, unspecified: Secondary | ICD-10-CM

## 2018-02-08 ENCOUNTER — Ambulatory Visit
Admission: RE | Admit: 2018-02-08 | Discharge: 2018-02-08 | Disposition: A | Discharge status: Home / Self Care | Payer: Medicare HMO | Source: Ambulatory Visit | Attending: Nephrology | Admitting: Nephrology

## 2018-02-08 DIAGNOSIS — N183 Chronic kidney disease, stage 3 unspecified: Secondary | ICD-10-CM

## 2018-02-08 DIAGNOSIS — D649 Anemia, unspecified: Secondary | ICD-10-CM

## 2018-02-08 DIAGNOSIS — E872 Acidosis, unspecified: Secondary | ICD-10-CM

## 2018-02-08 DIAGNOSIS — I129 Hypertensive chronic kidney disease with stage 1 through stage 4 chronic kidney disease, or unspecified chronic kidney disease: Secondary | ICD-10-CM

## 2018-02-08 DIAGNOSIS — E213 Hyperparathyroidism, unspecified: Secondary | ICD-10-CM

## 2018-02-08 DIAGNOSIS — N281 Cyst of kidney, acquired: Secondary | ICD-10-CM

## 2018-02-10 ENCOUNTER — Encounter: Payer: Self-pay | Admitting: Internal Medicine

## 2018-02-10 DIAGNOSIS — E210 Primary hyperparathyroidism: Secondary | ICD-10-CM | POA: Insufficient documentation

## 2018-02-11 DIAGNOSIS — R809 Proteinuria, unspecified: Secondary | ICD-10-CM | POA: Diagnosis not present

## 2018-03-25 ENCOUNTER — Other Ambulatory Visit: Payer: Self-pay

## 2018-03-25 DIAGNOSIS — I10 Essential (primary) hypertension: Secondary | ICD-10-CM

## 2018-03-25 MED ORDER — LOSARTAN POTASSIUM 25 MG PO TABS: 25.0000 mg | ORAL_TABLET | Freq: Every day | ORAL | 2 refills | Status: DC

## 2018-05-06 ENCOUNTER — Encounter: Payer: Self-pay | Admitting: Internal Medicine

## 2018-05-06 ENCOUNTER — Ambulatory Visit: Payer: Medicare HMO | Attending: Internal Medicine | Admitting: Internal Medicine

## 2018-05-06 VITALS — BP 186/102 | HR 88 | Temp 98.1°F | Resp 16 | Wt 134.4 lb

## 2018-05-06 DIAGNOSIS — Z8249 Family history of ischemic heart disease and other diseases of the circulatory system: Secondary | ICD-10-CM | POA: Insufficient documentation

## 2018-05-06 DIAGNOSIS — I10 Essential (primary) hypertension: Secondary | ICD-10-CM

## 2018-05-06 DIAGNOSIS — I272 Pulmonary hypertension, unspecified: Secondary | ICD-10-CM | POA: Diagnosis not present

## 2018-05-06 DIAGNOSIS — Z9889 Other specified postprocedural states: Secondary | ICD-10-CM | POA: Insufficient documentation

## 2018-05-06 DIAGNOSIS — N183 Chronic kidney disease, stage 3 unspecified: Secondary | ICD-10-CM

## 2018-05-06 DIAGNOSIS — M109 Gout, unspecified: Secondary | ICD-10-CM | POA: Diagnosis not present

## 2018-05-06 DIAGNOSIS — I129 Hypertensive chronic kidney disease with stage 1 through stage 4 chronic kidney disease, or unspecified chronic kidney disease: Secondary | ICD-10-CM | POA: Insufficient documentation

## 2018-05-06 DIAGNOSIS — Z23 Encounter for immunization: Secondary | ICD-10-CM | POA: Insufficient documentation

## 2018-05-06 DIAGNOSIS — Z888 Allergy status to other drugs, medicaments and biological substances status: Secondary | ICD-10-CM | POA: Insufficient documentation

## 2018-05-06 DIAGNOSIS — Z8673 Personal history of transient ischemic attack (TIA), and cerebral infarction without residual deficits: Secondary | ICD-10-CM | POA: Diagnosis not present

## 2018-05-06 DIAGNOSIS — Z7982 Long term (current) use of aspirin: Secondary | ICD-10-CM | POA: Diagnosis not present

## 2018-05-06 DIAGNOSIS — Z760 Encounter for issue of repeat prescription: Secondary | ICD-10-CM | POA: Insufficient documentation

## 2018-05-06 DIAGNOSIS — Z79899 Other long term (current) drug therapy: Secondary | ICD-10-CM | POA: Insufficient documentation

## 2018-05-06 DIAGNOSIS — Z87891 Personal history of nicotine dependence: Secondary | ICD-10-CM | POA: Insufficient documentation

## 2018-05-06 MED ORDER — METOPROLOL SUCCINATE ER 25 MG PO TB24: 25.0000 mg | ORAL_TABLET | Freq: Every day | ORAL | 3 refills | Status: DC

## 2018-05-06 MED ORDER — LOSARTAN POTASSIUM 25 MG PO TABS: 25.0000 mg | ORAL_TABLET | Freq: Every day | ORAL | 3 refills | Status: DC

## 2018-05-06 MED ORDER — LOSARTAN POTASSIUM 25 MG PO TABS: 25.0000 mg | ORAL_TABLET | Freq: Every day | ORAL | 2 refills | Status: DC

## 2018-05-06 NOTE — Progress Notes (Signed)
Patient ID: Tina Waller, female    DOB: 25-Mar-1941  MRN: [id number]  CC: Hypertension   Subjective: Tina Waller is a 77 y.o. female who presents for chronic disease management and medication refill. Her concerns today include:  Patient with history ofHTN,HL, vit D deficiency, osteoporosis on Dexascandone fall 2017, depression,CKD stage 3 (saw neph winter 2018), anemia (taking OTC iron once a day),CVA in 2008 and gout.  HTN: Patient states that she is mainly here today for refill on Cozaar.  She has been out for the past 8 days.  Reports that [facility] pharmacy try to get a prescription from [place] but apparently it was denied.  She then requested that it be sent to [facility] which it was sent on the 12th of last month with refills.  However patient states that [facility] did not call her to let her know that refill was ready.  Reports compliance with metoprolol and furosemide.  Reports that the swelling in her leg has remained down since being on the higher dose of furosemide. Check BP a few times a mth.  Reports SBP less than 150 Limits salt.   She no longer goes to the gym.  States that she just slacked off.  CKD: Reports seeing the nephrologist since last visit with me.  No medication changes Patient Active Problem List   Diagnosis Date Noted  . Primary hyperparathyroidism ([place]) 02/10/2018  . Vitamin B 12 deficiency 12/11/2017  . Pulmonary hypertension, mild ([place]) 12/11/2017  . CKD (chronic kidney disease) stage 3, GFR 30-59 ml/min (HCC) 04/17/2017  . Stasis dermatitis of both legs 04/17/2017  . Osteoporosis 12/22/2016  . Hx of gout 12/22/2016  . Estrogen deficiency 05/12/2016  . Vitamin D deficiency 05/12/2016  . Weight loss 05/12/2016  . HTN (hypertension) 06/12/2015  . History of CVA (cerebrovascular accident) 03/28/2015     Current Outpatient Medications on File Prior to Visit  Medication Sig Dispense Refill  . allopurinol (ZYLOPRIM) 100 MG tablet Take 1 tablet (100  mg total) by mouth daily. 90 tablet 3  . aspirin EC 81 MG tablet Take 81 mg by mouth daily.    Marland Kitchen docusate sodium (COLACE) 50 MG capsule Take 1 capsule (50 mg total) by mouth 2 (two) times daily. 60 capsule 0  . furosemide (LASIX) 40 MG tablet Take 1 tablet (40 mg total) by mouth daily. 30 tablet 3  . ibandronate (BONIVA) 150 MG tablet Take 1 tablet (150 mg total) by mouth every 30 (thirty) days. Take with a full glass of water, on an empty stomach, and stay upright for 30 min. 3 tablet 3  . losartan (COZAAR) 25 MG tablet Take 1 tablet (25 mg total) by mouth daily. 30 tablet 2  . metoprolol succinate (TOPROL-XL) 25 MG 24 hr tablet Take 1 tablet (25 mg total) by mouth daily. 90 tablet 3  . polyethylene glycol (MIRALAX) packet Take 17 g by mouth daily as needed. 10 each 0  . saccharomyces boulardii (FLORASTOR) 250 MG capsule Take 1 capsule (250 mg total) by mouth 2 (two) times daily. 30 capsule 1  . triamcinolone cream (KENALOG) 0.1 % Apply 1 application topically 2 (two) times daily. 80 g 1   Current Facility-Administered Medications on File Prior to Visit  Medication Dose Route Frequency Provider Last Rate Last Dose  . 0.9 %  sodium chloride infusion  500 mL Intravenous Continuous Doran Stabler, MD        Allergies  Allergen Reactions  . Lipitor [Atorvastatin]  Social History   Socioeconomic History  . Marital status: Divorced    Spouse name: Not on file  . Number of children: Not on file  . Years of education: Not on file  . Highest education level: Not on file  Occupational History  . Not on file  Social Needs  . Financial resource strain: Not on file  . Food insecurity:    Worry: Not on file    Inability: Not on file  . Transportation needs:    Medical: Not on file    Non-medical: Not on file  Tobacco Use  . Smoking status: Former Smoker    Last attempt to quit: 07/14/2006    Years since quitting: 11.8  . Smokeless tobacco: Never Used  Substance and Sexual Activity   . Alcohol use: Yes    Alcohol/week: 7.0 standard drinks    Types: 7 Shots of liquor per week    Comment: wine and vodka  . Drug use: No  . Sexual activity: Not on file  Lifestyle  . Physical activity:    Days per week: Not on file    Minutes per session: Not on file  . Stress: Not on file  Relationships  . Social connections:    Talks on phone: Not on file    Gets together: Not on file    Attends religious service: Not on file    Active member of club or organization: Not on file    Attends meetings of clubs or organizations: Not on file    Relationship status: Not on file  . Intimate partner violence:    Fear of current or ex partner: Not on file    Emotionally abused: Not on file    Physically abused: Not on file    Forced sexual activity: Not on file  Other Topics Concern  . Not on file  Social History Narrative  . Not on file    Family History  Problem Relation Age of Onset  . Heart disease Mother   . Osteoporosis Neg Hx     Past Surgical History:  Procedure Laterality Date  . ABDOMINAL HYSTERECTOMY    . CATARACT EXTRACTION Left     ROS: Review of Systems Negative except as above. PHYSICAL EXAM: BP (!) 186/102 (BP Location: Left Arm, Patient Position: Sitting, Cuff Size: Normal)   Pulse 88   Temp 98.1 F (36.7 C) (Oral)   Resp 16   Wt 134 lb 6.4 oz (61 kg)   SpO2 95%   BMI 19.85 kg/m   Wt Readings from Last 3 Encounters:  05/06/18 134 lb 6.4 oz (61 kg)  12/24/17 133 lb 6.4 oz (60.5 kg)  12/10/17 135 lb 3.2 oz (61.3 kg)    Physical Exam  General appearance - alert, well appearing, and in no distress Mental status - normal mood, behavior, speech, dress, motor activity, and thought processes Chest - clear to auscultation, no wheezes, rales or rhonchi, symmetric air entry Heart - normal rate, regular rhythm, normal S1, S2, no murmurs, rubs, clicks or gallops Extremities -trace to 1+ pedal edema.  She is wearing shoes that look uncomfortable on her  feet  ASSESSMENT AND PLAN: 1. Essential hypertension Uncontrolled.  She has been out of Cozaar.  I have given her a printed prescription to take to [facility].  She requested that I also sent a 90-day supply to [place] and that was done.  Continue to limit salt.  Encouraged her to continue checking blood pressure with goal  of 130/80 or lower. Follow-up in 3 weeks for repeat BP check - losartan (COZAAR) 25 MG tablet; Take 1 tablet (25 mg total) by mouth daily.  Dispense: 30 tablet; Refill: 2 - losartan (COZAAR) 25 MG tablet; Take 1 tablet (25 mg total) by mouth daily.  Dispense: 90 tablet; Refill: 3 - metoprolol succinate (TOPROL-XL) 25 MG 24 hr tablet; Take 1 tablet (25 mg total) by mouth daily.  Dispense: 90 tablet; Refill: 3 - Comprehensive metabolic panel  2. CKD (chronic kidney disease) stage 3, GFR 30-59 ml/min (HCC) Check comp today  3. Need for influenza vaccination   Patient was given the opportunity to ask questions.  Patient verbalized understanding of the plan and was able to repeat key elements of the plan.   No orders of the defined types were placed in this encounter.    Requested Prescriptions    No prescriptions requested or ordered in this encounter    No follow-ups on file.  Karle Plumber, MD, FACP

## 2018-05-07 ENCOUNTER — Telehealth: Payer: Self-pay

## 2018-05-07 LAB — COMPREHENSIVE METABOLIC PANEL
A/G RATIO: 1.1 — AB (ref 1.2–2.2)
ALBUMIN: 3.6 g/dL (ref 3.5–4.8)
ALT: 7 IU/L (ref 0–32)
AST: 15 IU/L (ref 0–40)
Alkaline Phosphatase: 140 IU/L — ABNORMAL HIGH (ref 39–117)
BUN / CREAT RATIO: 9 — AB (ref 12–28)
BUN: 8 mg/dL (ref 8–27)
Bilirubin Total: 0.5 mg/dL (ref 0.0–1.2)
CALCIUM: 8.9 mg/dL (ref 8.7–10.3)
CHLORIDE: 103 mmol/L (ref 96–106)
CO2: 24 mmol/L (ref 20–29)
CREATININE: 0.87 mg/dL (ref 0.57–1.00)
GFR calc Af Amer: 75 mL/min/{1.73_m2} (ref >59)
GFR calc non Af Amer: 65 mL/min/{1.73_m2} (ref >59)
GLOBULIN, TOTAL: 3.4 g/dL (ref 1.5–4.5)
Glucose: 100 mg/dL — ABNORMAL HIGH (ref 65–99)
Potassium: 3.8 mmol/L (ref 3.5–5.2)
Sodium: 142 mmol/L (ref 134–144)
Total Protein: 7 g/dL (ref 6.0–8.5)

## 2018-05-07 NOTE — Telephone Encounter (Signed)
Contacted pt to go over lab results pt is aware and doesn't have any questions or concerns 

## 2018-05-19 NOTE — Progress Notes (Signed)
   S:   PCP: Dr. Wynetta Emery   Patient arrives in good spirits. Presents to the clinic for hypertension management. Patient was referred by Dr. Wynetta Emery on 05/06/18. BP 186/102 at that visit. Losartan 25 mg was refilled. Pt reported being without for 8 days before that appointment.  Patient denies CP, SOB, HA or blurred vision. Patient reports adherence with medications.  Current BP Medications include:   - losartan 25 mg daily - furosemide 40 mg daily. Not taking - metoprolol XL 25 mg daily  Antihypertensives tried in the past include: - amlodipine (d/c d/t pedal edema) - lisinopril (d/c d/t dry cough) - HCTZ 12.5 mg (d/c d/t hyponatremia)   Dietary habits include:  - Patient limits salt - Drinks 2 cups of coffee/day in the morning Exercise habits include: - Does not exercise Family / Social history:  - FHx: heart disease (mother) - Tobacco: former smoker, stopped in 2008 - Alcohol: drinks Vodka "not every day; usually when we have a football/basketball game on"  Home BP readings:  - Reports 150s-160s/100s  O:  L arm after 5 minutes rest: 200/110, HR 85 L arm manual after rest: 198/92  Last 3 Office BP readings: BP Readings from Last 3 Encounters:  05/06/18 (!) 186/102  12/24/17 130/74  12/10/17 (!) 153/80    BMET    Component Value Date/Time   NA 142 05/06/2018 1659   K 3.8 05/06/2018 1659   CL 103 05/06/2018 1659   CO2 24 05/06/2018 1659   GLUCOSE 100 (H) 05/06/2018 1659   GLUCOSE 106 (H) 10/21/2017 1132   BUN 8 05/06/2018 1659   CREATININE 0.87 05/06/2018 1659   CREATININE 1.35 (H) 04/21/2016 1437   CALCIUM 8.9 05/06/2018 1659   GFRNONAA 65 05/06/2018 1659   GFRNONAA 39 (L) 04/21/2016 1437   GFRAA 75 05/06/2018 1659   GFRAA 45 (L) 04/21/2016 1437    Renal function: CrCl cannot be calculated (Unknown ideal weight.).  A/P: Hypertension longstanding currently uncontrolled on current medications. BP Goal <130/80 mmHg. Patient is adherent with current  medications.    Discussed patient's BP with her PCP. She is okay with patient re-starting furosemide and increasing losartan. Patient will return in 1 week for BP check and CMP.   -Restarted furosemide. -Increase losartan to 50 mg daily  -F/u labs ordered - pt will return in 1 week for CMP -Counseled on lifestyle modifications for blood pressure control including reduced dietary sodium, increased exercise, adequate sleep  Results reviewed and written information provided. Total time in face-to-face counseling 15 minutes.   F/U Clinic Visit in 1 week.    Patient seen with: Dixon Boos, PharmD Candidate [place] [employer] Class of 2021  Benard Halsted, PharmD, [place] [phone]

## 2018-05-20 ENCOUNTER — Encounter: Payer: Self-pay | Admitting: Pharmacist

## 2018-05-20 ENCOUNTER — Ambulatory Visit: Payer: Medicare HMO | Attending: Internal Medicine | Admitting: Pharmacist

## 2018-05-20 VITALS — BP 198/92 | HR 85

## 2018-05-20 DIAGNOSIS — Z8249 Family history of ischemic heart disease and other diseases of the circulatory system: Secondary | ICD-10-CM | POA: Diagnosis not present

## 2018-05-20 DIAGNOSIS — I10 Essential (primary) hypertension: Secondary | ICD-10-CM | POA: Diagnosis not present

## 2018-05-20 DIAGNOSIS — Z79899 Other long term (current) drug therapy: Secondary | ICD-10-CM | POA: Diagnosis not present

## 2018-05-20 DIAGNOSIS — Z87891 Personal history of nicotine dependence: Secondary | ICD-10-CM | POA: Insufficient documentation

## 2018-05-20 NOTE — Patient Instructions (Signed)
Thank you for coming to see [place] today.   Blood pressure today is elevated.   Take 2 tablets every day of your losartan. This will be a total dose of 50 mg.  Start back with 1 tablet of Lasix (furosemide) 40 mg a day.  Continue taking metoprolol.   I will see you in 1 week for labs a BP recheck.  If you have any questions about medications, please call me [phone].  Lurena Joiner

## 2018-05-27 ENCOUNTER — Ambulatory Visit: Payer: Medicare HMO | Attending: Internal Medicine | Admitting: Pharmacist

## 2018-05-27 VITALS — BP 187/84 | HR 73

## 2018-05-27 DIAGNOSIS — Z87891 Personal history of nicotine dependence: Secondary | ICD-10-CM | POA: Diagnosis not present

## 2018-05-27 DIAGNOSIS — Z8249 Family history of ischemic heart disease and other diseases of the circulatory system: Secondary | ICD-10-CM | POA: Insufficient documentation

## 2018-05-27 DIAGNOSIS — N183 Chronic kidney disease, stage 3 unspecified: Secondary | ICD-10-CM

## 2018-05-27 DIAGNOSIS — I10 Essential (primary) hypertension: Secondary | ICD-10-CM

## 2018-05-27 MED ORDER — LOSARTAN POTASSIUM 50 MG PO TABS: 50.0000 mg | ORAL_TABLET | Freq: Every day | ORAL | 3 refills | Status: DC

## 2018-05-27 MED ORDER — LOSARTAN POTASSIUM 100 MG PO TABS: 100.0000 mg | ORAL_TABLET | Freq: Every day | ORAL | 3 refills | Status: DC

## 2018-05-27 MED ORDER — PNEUMOCOCCAL 13-VAL CONJ VACC IM SUSP: 0.5000 mL | Freq: Once | INTRAMUSCULAR | 0 refills | Status: AC

## 2018-05-27 NOTE — Patient Instructions (Signed)
Thank you for coming to see [place] today.   Blood pressure today is improving.  Increase losartan to 100 mg daily.  Limiting salt and caffeine, as well as exercising as able for at least 30 minutes for 5 days out of the week, can also help you lower your blood pressure.  Take your blood pressure at home if you are able. Please write down these numbers and bring them to your visits.  If you have any questions about medications, please call me [phone].  Lurena Joiner

## 2018-05-27 NOTE — Progress Notes (Signed)
   S:   PCP: Dr. Wynetta Emery   Patient arrives in good spirits. Presents to the clinic for hypertension management. Patient was referred by Dr. Wynetta Emery on 05/06/18. I last saw her 05/20/18.  BP was very elevated. We increased her losartan and restarted furosemide for BLE edema.   Patient denies CP, SOB, HA or blurred vision. Patient reports adherence with medications.  Current BP Medications include:   - losartan 50 mg daily. (taking (2) 25 mg tablets daily) - furosemide 40 mg daily.  - metoprolol XL 25 mg daily  Antihypertensives tried in the past include: - amlodipine (d/c d/t pedal edema) - lisinopril (d/c d/t dry cough) - HCTZ 12.5 mg (d/c d/t hyponatremia)   Dietary habits include: limits salt, drinks 2 cups of coffee/day in the morning Exercise habits include: does not exercise Family / Social history: heart disease (mother), former smoker (stopped in 2008), drinks Vodka "not every day; usually when we have a football/basketball game on"  Home BP readings:  - Reports 150s-160s/100s  O:  L arm after 5 minutes rest: 187/84 , HR 73  Last 3 Office BP readings: BP Readings from Last 3 Encounters:  05/20/18 (!) 198/92  05/06/18 (!) 186/102  12/24/17 130/74    BMET    Component Value Date/Time   NA 142 05/06/2018 1659   K 3.8 05/06/2018 1659   CL 103 05/06/2018 1659   CO2 24 05/06/2018 1659   GLUCOSE 100 (H) 05/06/2018 1659   GLUCOSE 106 (H) 10/21/2017 1132   BUN 8 05/06/2018 1659   CREATININE 0.87 05/06/2018 1659   CREATININE 1.35 (H) 04/21/2016 1437   CALCIUM 8.9 05/06/2018 1659   GFRNONAA 65 05/06/2018 1659   GFRNONAA 39 (L) 04/21/2016 1437   GFRAA 75 05/06/2018 1659   GFRAA 45 (L) 04/21/2016 1437    Renal function: CrCl cannot be calculated (Unknown ideal weight.).  Clinical ASCVD: stroke (2008)  A/P: Hypertension longstanding currently uncontrolled on current medications. BP Goal <130/80 mmHg. Patient is adherent with current medications.  Discussed BP  finding with PCP today. Will increase losartan to 100 mg daily. - Increased losartan to 100 mg daily - CMP+GFR14 -Counseled on lifestyle modifications for blood pressure control including reduced dietary sodium, increased exercise, adequate sleep - HM: Prevnar given  Results reviewed and written information provided. Total time in face-to-face counseling 15 minutes.   F/U Clinic Visit 06/28/18.  Patient seen with: Dixon Boos, PharmD Candidate [place] [employer] Class of 2021  Benard Halsted, PharmD, [place] [phone]

## 2018-05-28 ENCOUNTER — Telehealth: Payer: Self-pay

## 2018-05-28 ENCOUNTER — Encounter: Payer: Self-pay | Admitting: Pharmacist

## 2018-05-28 LAB — CMP14+EGFR
A/G RATIO: 1.1 — AB (ref 1.2–2.2)
ALBUMIN: 3.6 g/dL (ref 3.5–4.8)
ALT: 7 IU/L (ref 0–32)
AST: 20 IU/L (ref 0–40)
Alkaline Phosphatase: 121 IU/L — ABNORMAL HIGH (ref 39–117)
BUN / CREAT RATIO: 12 (ref 12–28)
BUN: 11 mg/dL (ref 8–27)
Bilirubin Total: 0.7 mg/dL (ref 0.0–1.2)
CALCIUM: 8.9 mg/dL (ref 8.7–10.3)
CO2: 25 mmol/L (ref 20–29)
Chloride: 99 mmol/L (ref 96–106)
Creatinine, Ser: 0.94 mg/dL (ref 0.57–1.00)
GFR, EST AFRICAN AMERICAN: 69 mL/min/{1.73_m2} (ref >59)
GFR, EST NON AFRICAN AMERICAN: 60 mL/min/{1.73_m2} (ref >59)
Globulin, Total: 3.2 g/dL (ref 1.5–4.5)
Glucose: 94 mg/dL (ref 65–99)
Potassium: 3.5 mmol/L (ref 3.5–5.2)
Sodium: 144 mmol/L (ref 134–144)
TOTAL PROTEIN: 6.8 g/dL (ref 6.0–8.5)

## 2018-05-28 NOTE — Telephone Encounter (Signed)
Contacted pt to go over lab results pt is aware and doesn't have any questions or concerns 

## 2018-06-28 ENCOUNTER — Ambulatory Visit: Payer: Medicare HMO | Attending: Family Medicine | Admitting: Pharmacist

## 2018-06-28 DIAGNOSIS — I10 Essential (primary) hypertension: Secondary | ICD-10-CM | POA: Insufficient documentation

## 2018-06-28 DIAGNOSIS — Z8249 Family history of ischemic heart disease and other diseases of the circulatory system: Secondary | ICD-10-CM | POA: Insufficient documentation

## 2018-06-28 DIAGNOSIS — Z87891 Personal history of nicotine dependence: Secondary | ICD-10-CM | POA: Insufficient documentation

## 2018-06-28 MED ORDER — CARVEDILOL 6.25 MG PO TABS: 6.2500 mg | ORAL_TABLET | Freq: Two times a day (BID) | ORAL | 2 refills | Status: DC

## 2018-06-28 NOTE — Patient Instructions (Signed)
Thank you for coming to see [place] today.   Blood pressure today is elevated.  Continue taking losartan 100 mg 1 tablet daily.   Stop metoprolol and start taking carvedilol 6.25 mg 1 tablet 2 times daily.   Limiting salt and caffeine, as well as exercising as able for at least 30 minutes for 5 days out of the week, can also help you lower your blood pressure.  Take your blood pressure at home if you are able. Please write down these numbers and bring them to your visits.  If you have any questions about medications, please call me [phone].  Lurena Joiner

## 2018-06-28 NOTE — Progress Notes (Signed)
   S:   PCP: Dr. Wynetta Emery   Patient arrives in good spirits. Presents to the clinic for hypertension management. Patient was referred by Dr. Wynetta Emery on 05/06/18. I last saw her 05/27/18. We increased her losartan to 100 mg daily.  Patient denies CP, SOB, HA or blurred vision. Patient reports adherence with medications. She has taken losartan and metoprolol today but not furosemide. Of note, her delivery service delivered losartan last week.   Current BP Medications include:   - losartan 100 mg daily.  - furosemide 40 mg daily.  - metoprolol XL 25 mg daily  Antihypertensives tried in the past include: - amlodipine (d/c d/t pedal edema) - lisinopril (d/c d/t dry cough) - HCTZ 12.5 mg (d/c d/t hyponatremia)   Dietary habits include: limits salt, drinks 2 cups of coffee/day in the morning Exercise habits include: does not exercise Family / Social history: heart disease (mother), former smoker (stopped in 2008), drinks Vodka "not every day; usually when we have a football/basketball game on"  Home BP readings:  - Reports 150s-160s/100s  O:  L arm after 5 minutes rest: 203/99 , HR 90  Last 3 Office BP readings: BP Readings from Last 3 Encounters:  05/27/18 (!) 187/84  05/20/18 (!) 198/92  05/06/18 (!) 186/102    BMET    Component Value Date/Time   NA 144 05/27/2018 1629   K 3.5 05/27/2018 1629   CL 99 05/27/2018 1629   CO2 25 05/27/2018 1629   GLUCOSE 94 05/27/2018 1629   GLUCOSE 106 (H) 10/21/2017 1132   BUN 11 05/27/2018 1629   CREATININE 0.94 05/27/2018 1629   CREATININE 1.35 (H) 04/21/2016 1437   CALCIUM 8.9 05/27/2018 1629   GFRNONAA 60 05/27/2018 1629   GFRNONAA 39 (L) 04/21/2016 1437   GFRAA 69 05/27/2018 1629   GFRAA 45 (L) 04/21/2016 1437    Renal function: CrCl cannot be calculated (Patient's most recent lab result is older than the maximum 21 days allowed.).  Clinical ASCVD: stroke (2008)  A/P: Hypertension longstanding currently uncontrolled on current  medications. BP Goal <130/80 mmHg. Patient is adherent with current medications.  Discussed BP finding with PCP. Pt elected not to be seen today. Dr. Wynetta Emery and I agree to stop metoprolol and start carvedilol 6.25 mg BID for better BP lowering.  -Continued losartan and furosemide -Stop metoprolol, start carvedilol  -Counseled on lifestyle modifications for blood pressure control including reduced dietary sodium, increased exercise, adequate sleep  Results reviewed and written information provided. Total time in face-to-face counseling 15 minutes.   F/U Clinic Visit w/ PCP  Benard Halsted, PharmD, [place] [phone]

## 2018-06-29 ENCOUNTER — Encounter: Payer: Self-pay | Admitting: Pharmacist

## 2018-07-13 ENCOUNTER — Other Ambulatory Visit: Payer: Self-pay

## 2018-07-13 ENCOUNTER — Emergency Department (HOSPITAL_COMMUNITY)
Admission: EM | Admit: 2018-07-13 | Discharge: 2018-07-13 | Disposition: A | Discharge status: Home / Self Care | Payer: Medicare HMO | Attending: Emergency Medicine | Admitting: Emergency Medicine

## 2018-07-13 ENCOUNTER — Encounter (HOSPITAL_COMMUNITY): Payer: Self-pay | Admitting: Emergency Medicine

## 2018-07-13 DIAGNOSIS — R197 Diarrhea, unspecified: Secondary | ICD-10-CM | POA: Insufficient documentation

## 2018-07-13 DIAGNOSIS — R030 Elevated blood-pressure reading, without diagnosis of hypertension: Secondary | ICD-10-CM | POA: Insufficient documentation

## 2018-07-13 DIAGNOSIS — I10 Essential (primary) hypertension: Secondary | ICD-10-CM | POA: Diagnosis not present

## 2018-07-13 DIAGNOSIS — H538 Other visual disturbances: Secondary | ICD-10-CM | POA: Diagnosis present

## 2018-07-13 LAB — URINALYSIS, ROUTINE W REFLEX MICROSCOPIC
Bilirubin Urine: NEGATIVE
Glucose, UA: NEGATIVE mg/dL
KETONES UR: NEGATIVE mg/dL
Leukocytes, UA: NEGATIVE
Nitrite: NEGATIVE
Protein, ur: 100 mg/dL — AB
Specific Gravity, Urine: 1.006 (ref 1.005–1.030)
pH: 6 (ref 5.0–8.0)

## 2018-07-13 LAB — CBC
HCT: 38.1 % (ref 36.0–46.0)
Hemoglobin: 13 g/dL (ref 12.0–15.0)
MCH: 36.3 pg — ABNORMAL HIGH (ref 26.0–34.0)
MCHC: 34.1 g/dL (ref 30.0–36.0)
MCV: 106.4 fL — AB (ref 80.0–100.0)
NRBC: 0 % (ref 0.0–0.2)
Platelets: 294 10*3/uL (ref 150–400)
RBC: 3.58 MIL/uL — AB (ref 3.87–5.11)
RDW: 12.3 % (ref 11.5–15.5)
WBC: 5.3 10*3/uL (ref 4.0–10.5)

## 2018-07-13 LAB — CBG MONITORING, ED: Glucose-Capillary: 117 mg/dL — ABNORMAL HIGH (ref 70–99)

## 2018-07-13 LAB — POC OCCULT BLOOD, ED: FECAL OCCULT BLD: NEGATIVE

## 2018-07-13 LAB — BASIC METABOLIC PANEL
Anion gap: 7 (ref 5–15)
BUN: 6 mg/dL — ABNORMAL LOW (ref 8–23)
CO2: 28 mmol/L (ref 22–32)
Calcium: 8.8 mg/dL — ABNORMAL LOW (ref 8.9–10.3)
Chloride: 105 mmol/L (ref 98–111)
Creatinine, Ser: 0.89 mg/dL (ref 0.44–1.00)
GFR calc Af Amer: >60 mL/min (ref >60)
GFR calc non Af Amer: >60 mL/min (ref >60)
Glucose, Bld: 104 mg/dL — ABNORMAL HIGH (ref 70–99)
Potassium: 3.4 mmol/L — ABNORMAL LOW (ref 3.5–5.1)
Sodium: 140 mmol/L (ref 135–145)

## 2018-07-13 MED ORDER — HYDRALAZINE HCL 20 MG/ML IJ SOLN
10.0000 mg | Freq: Once | INTRAMUSCULAR | Status: AC
  Administered 2018-07-13: 10 mg via INTRAVENOUS
  Filled 2018-07-13: qty 1

## 2018-07-13 NOTE — ED Notes (Signed)
Discharge instructions discussed with Pt. Pt verbalized understanding. Pt stable and ambulatory.    

## 2018-07-13 NOTE — ED Provider Notes (Signed)
[facility] EMERGENCY DEPARTMENT Provider Note   CSN: [id number] Arrival date & time: 07/13/18  1333     History   Chief Complaint Chief Complaint  Patient presents with  . Blurred Vision  . Rectal Bleeding  . Dizziness    HPI Tina Waller is a 77 y.o. female.  HPI Patient has had elevated blood pressure for the last few months.  She has been intermittently compliant with her blood pressure medications.  States she stopped taking Cozaar 2 days ago because she was concerned it was contributing to her loose stools.  She also complains of blurred vision and lightheadedness especially with standing.  She is had roughly 1 week of bloody stools mixed with mucus.  States they have been loose and worsening for last day.  She is also stopped taking her Lasix because she is concerned this was contributing to her diarrhea.  She had increased lower extremity swelling since that time.  No recent hospitalizations or use of antibiotics.  No nausea or vomiting.  Denies abdominal pain.  No chest pain or shortness of breath.  No focal weakness or numbness. Past Medical History:  Diagnosis Date  . Anemia   . Anxiety   . Cataract   . Depression   . Diabetes mellitus without complication ([place])    pt denies  . Gout   . Hypertension   . Stroke [facility]) 2008    Patient Active Problem List   Diagnosis Date Noted  . Primary hyperparathyroidism ([place]) 02/10/2018  . Vitamin B 12 deficiency 12/11/2017  . Pulmonary hypertension, mild ([place]) 12/11/2017  . CKD (chronic kidney disease) stage 3, GFR 30-59 ml/min ([facility]) 04/17/2017  . Stasis dermatitis of both legs 04/17/2017  . Osteoporosis 12/22/2016  . Hx of gout 12/22/2016  . Estrogen deficiency 05/12/2016  . Vitamin D deficiency 05/12/2016  . Weight loss 05/12/2016  . HTN (hypertension) 06/12/2015  . History of CVA (cerebrovascular accident) 03/28/2015    Past Surgical History:  Procedure Laterality Date  . ABDOMINAL  HYSTERECTOMY    . CATARACT EXTRACTION Left      OB History   No obstetric history on file.      Home Medications    Prior to Admission medications   Medication Sig Start Date End Date Taking? Authorizing Provider  aspirin EC 81 MG tablet Take 81 mg by mouth daily.   Yes [provider]  carvedilol (COREG) 6.25 MG tablet Take 1 tablet (6.25 mg total) by mouth 2 (two) times daily with a meal. 06/28/18  Yes [name], MD  Cyanocobalamin (VITAMIN B 12 PO) Take 1 tablet by mouth daily.   Yes [provider]  furosemide (LASIX) 40 MG tablet Take 1 tablet (40 mg total) by mouth daily. 12/10/17  Yes [name], MD  losartan (COZAAR) 100 MG tablet Take 1 tablet (100 mg total) by mouth daily. 05/27/18  Yes [name], MD  metoprolol succinate (TOPROL-XL) 25 MG 24 hr tablet Take 25 mg by mouth daily.   Yes [provider]  polyethylene glycol (MIRALAX) packet Take 17 g by mouth daily as needed. 10/21/17  Yes Jaynee Eagles, PA-C  triamcinolone cream (KENALOG) 0.1 % Apply 1 application topically 2 (two) times daily. 01/12/17  Yes [name], MD  VITAMIN D PO Take 1 tablet by mouth daily.   Yes [provider]  allopurinol (ZYLOPRIM) 100 MG tablet Take 1 tablet (100 mg total) by mouth daily. Patient not taking: Reported  on 07/13/2018 04/21/16   Maren Reamer, MD  docusate sodium (COLACE) 50 MG capsule Take 1 capsule (50 mg total) by mouth 2 (two) times daily. Patient not taking: Reported on 07/13/2018 10/21/17   Jaynee Eagles, PA-C  ibandronate (BONIVA) 150 MG tablet Take 1 tablet (150 mg total) by mouth every 30 (thirty) days. Take with a full glass of water, on an empty stomach, and stay upright for 30 min. Patient not taking: Reported on 07/13/2018 04/17/17   [name], MD  saccharomyces boulardii (FLORASTOR) 250 MG capsule Take 1 capsule (250 mg total) by mouth 2 (two) times daily. Patient not taking: Reported on 07/13/2018  11/12/16   Maren Reamer, MD    Family History Family History  Problem Relation Age of Onset  . Heart disease Mother   . Osteoporosis Neg Hx     Social History Social History   Tobacco Use  . Smoking status: Former Smoker    Last attempt to quit: 07/14/2006    Years since quitting: 12.0  . Smokeless tobacco: Never Used  Substance Use Topics  . Alcohol use: Yes    Alcohol/week: 7.0 standard drinks    Types: 7 Shots of liquor per week    Comment: wine and vodka  . Drug use: No     Allergies   Lipitor [atorvastatin] and Lisinopril   Review of Systems Review of Systems  Constitutional: Negative for chills and fever.  HENT: Negative for trouble swallowing.   Eyes: Positive for visual disturbance. Negative for pain.  Respiratory: Negative for cough and shortness of breath.   Cardiovascular: Positive for leg swelling. Negative for chest pain and palpitations.  Gastrointestinal: Positive for blood in stool and diarrhea. Negative for abdominal pain, constipation, nausea and vomiting.  Genitourinary: Negative for dysuria, flank pain and frequency.  Musculoskeletal: Negative for back pain, myalgias, neck pain and neck stiffness.  Skin: Negative for rash and wound.  Neurological: Positive for dizziness and light-headedness. Negative for weakness, numbness and headaches.  All other systems reviewed and are negative.    Physical Exam Updated Vital Signs BP (!) 143/61   Pulse 98   Temp 98.2 F (36.8 C) (Oral)   Resp 20   Ht 5\' 8"  (1.727 m)   Wt 56.7 kg   SpO2 99%   BMI 19.01 kg/m   Physical Exam Vitals signs and nursing note reviewed.  Constitutional:      General: She is not in acute distress.    Appearance: Normal appearance. She is well-developed. She is not ill-appearing.  HENT:     Head: Normocephalic and atraumatic.     Nose: Nose normal. No congestion.     Mouth/Throat:     Mouth: Mucous membranes are moist.     Pharynx: No oropharyngeal exudate or  posterior oropharyngeal erythema.  Eyes:     Extraocular Movements: Extraocular movements intact.     Pupils: Pupils are equal, round, and reactive to light.  Neck:     Musculoskeletal: Normal range of motion and neck supple. No neck rigidity or muscular tenderness.  Cardiovascular:     Rate and Rhythm: Normal rate and regular rhythm.     Heart sounds: No murmur. No friction rub. No gallop.   Pulmonary:     Effort: Pulmonary effort is normal. No respiratory distress.     Breath sounds: Normal breath sounds. No stridor. No wheezing, rhonchi or rales.  Chest:     Chest wall: No tenderness.  Abdominal:  General: Bowel sounds are normal. There is no distension.     Palpations: Abdomen is soft. There is no mass.     Tenderness: There is no abdominal tenderness. There is no guarding or rebound.     Hernia: No hernia is present.  Musculoskeletal: Normal range of motion.        General: No tenderness.     Right lower leg: Edema present.     Left lower leg: Edema present.     Comments: 2+ bilateral lower extremities pitting edema.  Distal pulses intact.  Lymphadenopathy:     Cervical: No cervical adenopathy.  Skin:    General: Skin is warm and dry.     Findings: No erythema or rash.  Neurological:     General: No focal deficit present.     Mental Status: She is alert and oriented to person, place, and time.     Comments: Moving all extremities without focal deficit.  Sensation fully intact.  Psychiatric:        Mood and Affect: Mood normal.        Behavior: Behavior normal.      ED Treatments / Results  Labs (all labs ordered are listed, but only abnormal results are displayed) Labs Reviewed  BASIC METABOLIC PANEL - Abnormal; Notable for the following components:      Result Value   Potassium 3.4 (*)    Glucose, Bld 104 (*)    BUN 6 (*)    Calcium 8.8 (*)    All other components within normal limits  CBC - Abnormal; Notable for the following components:   RBC 3.58 (*)      MCV 106.4 (*)    MCH 36.3 (*)    All other components within normal limits  URINALYSIS, ROUTINE W REFLEX MICROSCOPIC - Abnormal; Notable for the following components:   Hgb urine dipstick SMALL (*)    Protein, ur 100 (*)    Bacteria, UA RARE (*)    All other components within normal limits  CBG MONITORING, ED - Abnormal; Notable for the following components:   Glucose-Capillary 117 (*)    All other components within normal limits  C DIFFICILE QUICK SCREEN W PCR REFLEX  GASTROINTESTINAL PANEL BY PCR, STOOL (REPLACES STOOL CULTURE)  POC OCCULT BLOOD, ED    EKG EKG Interpretation  Date/Time:  Tuesday July 13 2018 13:49:20 EST Ventricular Rate:  88 PR Interval:  180 QRS Duration: 104 QT Interval:  336 QTC Calculation: 406 R Axis:   25 Text Interpretation:  Normal sinus rhythm Septal infarct , age undetermined Abnormal ECG Confirmed by Julianne Rice [phone]) on 07/13/2018 3:52:40 PM   Radiology No results found.  Procedures Procedures (including critical care time)  Medications Ordered in ED Medications  hydrALAZINE (APRESOLINE) injection 10 mg (10 mg Intravenous Given 07/13/18 1646)     Initial Impression / Assessment and Plan / ED Course  I have reviewed the triage vital signs and the nursing notes.  Pertinent labs & imaging results that were available during my care of the patient were reviewed by me and considered in my medical decision making (see chart for details).    Brown stool on rectal exam.  Guaiac negative.  Hemoglobin is stable.  She is advised to follow-up with her gastroenterologist.  Blood pressure improved with hydralazine.  Patient is advised to take her medications as prescribed and follow-up closely with her primary physician for adjustments as needed.  Final Clinical Impressions(s) / ED Diagnoses   Final  diagnoses:  Elevated blood pressure reading  Diarrhea, unspecified type    ED Discharge Orders    None       Julianne Rice,  MD 07/13/18 [id number]

## 2018-07-13 NOTE — Discharge Instructions (Addendum)
There is no evidence of blood in your stool today.  Your hemoglobin is normal.  Make appointment to follow-up closely with your gastroenterologist.  You will also need to follow-up closely with your primary physician regarding blood pressure management.  Take your medications as prescribed.

## 2018-07-13 NOTE — ED Notes (Signed)
Pt aware that a stool sample is needed and will provide one when able.

## 2018-07-13 NOTE — ED Triage Notes (Signed)
Pt. Stated, I have blurred vision, blood in my stool and mucous., thiis has been going on for 2 weeks.

## 2018-07-20 ENCOUNTER — Encounter: Payer: Self-pay | Admitting: Internal Medicine

## 2018-07-20 ENCOUNTER — Ambulatory Visit: Payer: Medicare HMO | Attending: Internal Medicine | Admitting: Internal Medicine

## 2018-07-20 VITALS — BP 206/111 | HR 90 | Temp 98.1°F | Resp 16 | Ht 69.0 in | Wt 134.0 lb

## 2018-07-20 DIAGNOSIS — I10 Essential (primary) hypertension: Secondary | ICD-10-CM

## 2018-07-20 DIAGNOSIS — E876 Hypokalemia: Secondary | ICD-10-CM | POA: Diagnosis not present

## 2018-07-20 DIAGNOSIS — D7589 Other specified diseases of blood and blood-forming organs: Secondary | ICD-10-CM

## 2018-07-20 DIAGNOSIS — K625 Hemorrhage of anus and rectum: Secondary | ICD-10-CM | POA: Diagnosis not present

## 2018-07-20 DIAGNOSIS — Z9181 History of falling: Secondary | ICD-10-CM

## 2018-07-20 MED ORDER — AMLODIPINE BESYLATE 5 MG PO TABS: 5.0000 mg | ORAL_TABLET | Freq: Every day | ORAL | 0 refills | Status: DC

## 2018-07-20 NOTE — Progress Notes (Signed)
Patient ID: Tina Waller, female    DOB: [birth year]  MRN: [id number]  CC: Hospitalization Follow-up (ED)   Subjective: Tina Waller is a 78 y.o. female who presents for chronic ds management and ER f/u Her concerns today include:  Patient with history ofHTN,HL, vit D deficiency, osteoporosis on Dexascandone fall 2017, depression,CVA in 2008 and gout.  Seen in ER 07/13/2018 with c/o diarrhea, rectal bleeding and dizziness.  Was having up to 10 BMs a day.  On last visit with our clinical pharmacist, she was changed from metoprolol to carvedilol.  Diarrhea started the same day that she started taking carvedilol.   She subsequently stopped the carvedilol because she felt it was causing the diarrhea. Went back to taking Metoprolol, Losartan and Furosemide.   Diarrhea has stopped.  Dizziness better but still there when she stands up sometimes.  Drinking at least 5 glasses water a day and some juice.  She stopped drinking wine since being sick.  Prior to this, she was drinking 2-3 glasses a night. No abdominal pain, nausea or vomiting.  Hemoccult in the ER was negative. Checking BP daily.  SBP has been 200 or more.  HA sometimes.  Blurred vision only if reading without her reading glasses.   -She has not taken blood pressure medicines as yet for the morning.  Fell 2 wks ago at home when she stood up from couch to go to bedroom.  No LOC.  Does not recall if she felt dizzy or had palpitations.  Denies having had any wine that evening.  Landed on buttock.  Crawled to her bedroom.  Denies any injuries.  She has a cane and walker at home which she does not use.  She lives alone.  Patient Active Problem List   Diagnosis Date Noted  . Primary hyperparathyroidism ([place]) 02/10/2018  . Vitamin B 12 deficiency 12/11/2017  . Pulmonary hypertension, mild ([place]) 12/11/2017  . CKD (chronic kidney disease) stage 3, GFR 30-59 ml/min (HCC) 04/17/2017  . Stasis dermatitis of both legs 04/17/2017  .  Osteoporosis 12/22/2016  . Hx of gout 12/22/2016  . Estrogen deficiency 05/12/2016  . Vitamin D deficiency 05/12/2016  . Weight loss 05/12/2016  . HTN (hypertension) 06/12/2015  . History of CVA (cerebrovascular accident) 03/28/2015     Current Outpatient Medications on File Prior to Visit  Medication Sig Dispense Refill  . allopurinol (ZYLOPRIM) 100 MG tablet Take 1 tablet (100 mg total) by mouth daily. (Patient not taking: Reported on 07/13/2018) 90 tablet 3  . aspirin EC 81 MG tablet Take 81 mg by mouth daily.    . Cyanocobalamin (VITAMIN B 12 PO) Take 1 tablet by mouth daily.    Marland Kitchen docusate sodium (COLACE) 50 MG capsule Take 1 capsule (50 mg total) by mouth 2 (two) times daily. (Patient not taking: Reported on 07/13/2018) 60 capsule 0  . furosemide (LASIX) 40 MG tablet Take 1 tablet (40 mg total) by mouth daily. 30 tablet 3  . ibandronate (BONIVA) 150 MG tablet Take 1 tablet (150 mg total) by mouth every 30 (thirty) days. Take with a full glass of water, on an empty stomach, and stay upright for 30 min. (Patient not taking: Reported on 07/13/2018) 3 tablet 3  . losartan (COZAAR) 100 MG tablet Take 1 tablet (100 mg total) by mouth daily. 90 tablet 3  . metoprolol succinate (TOPROL-XL) 25 MG 24 hr tablet Take 25 mg by mouth daily.    . polyethylene glycol (MIRALAX) packet Take 17  g by mouth daily as needed. 10 each 0  . saccharomyces boulardii (FLORASTOR) 250 MG capsule Take 1 capsule (250 mg total) by mouth 2 (two) times daily. (Patient not taking: Reported on 07/13/2018) 30 capsule 1  . triamcinolone cream (KENALOG) 0.1 % Apply 1 application topically 2 (two) times daily. 80 g 1  . VITAMIN D PO Take 1 tablet by mouth daily.     Current Facility-Administered Medications on File Prior to Visit  Medication Dose Route Frequency Provider Last Rate Last Dose  . 0.9 %  sodium chloride infusion  500 mL Intravenous Continuous Doran Stabler, MD        Allergies  Allergen Reactions  .  Lipitor [Atorvastatin] Diarrhea  . Lisinopril Cough    Social History   Socioeconomic History  . Marital status: Divorced    Spouse name: Not on file  . Number of children: Not on file  . Years of education: Not on file  . Highest education level: Not on file  Occupational History  . Not on file  Social Needs  . Financial resource strain: Not on file  . Food insecurity:    Worry: Not on file    Inability: Not on file  . Transportation needs:    Medical: Not on file    Non-medical: Not on file  Tobacco Use  . Smoking status: Former Smoker    Last attempt to quit: 07/14/2006    Years since quitting: 12.0  . Smokeless tobacco: Never Used  Substance and Sexual Activity  . Alcohol use: Yes    Alcohol/week: 7.0 standard drinks    Types: 7 Shots of liquor per week    Comment: wine and vodka  . Drug use: No  . Sexual activity: Not on file  Lifestyle  . Physical activity:    Days per week: Not on file    Minutes per session: Not on file  . Stress: Not on file  Relationships  . Social connections:    Talks on phone: Not on file    Gets together: Not on file    Attends religious service: Not on file    Active member of club or organization: Not on file    Attends meetings of clubs or organizations: Not on file    Relationship status: Not on file  . Intimate partner violence:    Fear of current or ex partner: Not on file    Emotionally abused: Not on file    Physically abused: Not on file    Forced sexual activity: Not on file  Other Topics Concern  . Not on file  Social History Narrative  . Not on file    Family History  Problem Relation Age of Onset  . Heart disease Mother   . Osteoporosis Neg Hx     Past Surgical History:  Procedure Laterality Date  . ABDOMINAL HYSTERECTOMY    . CATARACT EXTRACTION Left     ROS: Review of Systems Negative except as above. PHYSICAL EXAM: BP (!) 206/111 (BP Location: Left Arm, Patient Position: Sitting, Cuff Size: Normal)    Pulse 90   Temp 98.1 F (36.7 C) (Oral)   Resp 16   Ht 5\' 9"  (1.753 m)   Wt 134 lb (60.8 kg)   SpO2 98%   BMI 19.79 kg/m   Wt Readings from Last 3 Encounters:  07/20/18 134 lb (60.8 kg)  07/13/18 125 lb (56.7 kg)  05/06/18 134 lb 6.4 oz (61 kg)  BP 213/89, P 86 sitting;  Standing 213/104 P 90  Physical Exam  General appearance - alert, well appearing, elderly female and in no distress Mental status - alert, oriented to person, place, and time.  Patient appears tired. Mouth -oral mucosa moist. Neck -no cervical lymphadenopathy.  She has noticeable and palpable pulsating artery anterior neck right side just above the subclavian Chest - clear to auscultation, no wheezes, rales or rhonchi, symmetric air entry Heart - normal rate, regular rhythm, normal S1, S2, no murmurs, rubs, clicks or gallops Neurological -power in the upper extremities 4+/5 bilaterally.  Power 4+/5 LLE and 4/5 RLE.  Patient with decreased foot to floor clearance on ambulation Extremities -trace to 1+ lower extremity edema   Lab Results  Component Value Date   WBC 5.3 07/13/2018   HGB 13.0 07/13/2018   HCT 38.1 07/13/2018   MCV 106.4 (H) 07/13/2018   PLT 294 07/13/2018     Chemistry      Component Value Date/Time   NA 140 07/13/2018 1358   NA 144 05/27/2018 1629   K 3.4 (L) 07/13/2018 1358   CL 105 07/13/2018 1358   CO2 28 07/13/2018 1358   BUN 6 (L) 07/13/2018 1358   BUN 11 05/27/2018 1629   CREATININE 0.89 07/13/2018 1358   CREATININE 1.35 (H) 04/21/2016 1437      Component Value Date/Time   CALCIUM 8.8 (L) 07/13/2018 1358   ALKPHOS 121 (H) 05/27/2018 1629   AST 20 05/27/2018 1629   ALT 7 05/27/2018 1629   BILITOT 0.7 05/27/2018 1629      ASSESSMENT AND PLAN: 1. Essential hypertension Coreg can cause diarrhea and 12% of patients per UpToDate.  Agree with stopping Coreg and restarting metoprolol 25 mg daily.  Continue furosemide and Cozaar. Add low-dose of amlodipine. Advised to  continue checking blood pressure at home daily.  Follow-up in 1 week with myself or the clinical pharmacist for repeat blood pressure check.  Be advised that on follow-up visit I probably would not increase the dose of Norvasc if she is tolerating the low-dose as patient in the past had increased edema with amlodipine - amLODipine (NORVASC) 5 MG tablet; Take 1 tablet (5 mg total) by mouth daily.  Dispense: 30 tablet; Refill: 0  2. History of recent fall Advised patient to cut back to no more than 1 glass of wine per night. Recommend referral to physical therapy for gait training and strengthening but patient declined  3. Rectal bleeding Resolved but still will refer to gastroenterology. - Ambulatory referral to Gastroenterology  4. Macrocytosis With normal WBC.  ? etiology - Vitamin B12 - Folate  5. Hypokalemia - Potassium   Patient was given the opportunity to ask questions.  Patient verbalized understanding of the plan and was able to repeat key elements of the plan.   Orders Placed This Encounter  Procedures  . Vitamin B12  . Folate  . Potassium  . Ambulatory referral to Gastroenterology     Requested Prescriptions   Signed Prescriptions Disp Refills  . amLODipine (NORVASC) 5 MG tablet 30 tablet 0    Sig: Take 1 tablet (5 mg total) by mouth daily.    Return in about 1 week (around 07/27/2018).  Karle Plumber, MD, FACP

## 2018-07-20 NOTE — Progress Notes (Signed)
Pt states she can't take the carvedilol because it gives her diarrhea

## 2018-07-20 NOTE — Patient Instructions (Signed)
Continue furosemide, losartan and metoprolol.  Start amlodipine 5 mg daily.  Continue to limit salt in the foods.  Continue to monitor blood pressure and bring in readings on next visit.  Go slow with position changes.  Consider using your cane at home and when you are away from home.  I have referred you to the gastroenterologist.  Avoid drinking more than 1 glass of wine a day.

## 2018-07-21 ENCOUNTER — Other Ambulatory Visit: Payer: Self-pay | Admitting: Internal Medicine

## 2018-07-21 LAB — FOLATE: Folate: 6.2 ng/mL (ref >3.0)

## 2018-07-21 LAB — VITAMIN B12: Vitamin B-12: 1095 pg/mL (ref 232–1245)

## 2018-07-21 LAB — POTASSIUM: Potassium: 3.4 mmol/L — ABNORMAL LOW (ref 3.5–5.2)

## 2018-07-21 MED ORDER — POTASSIUM CHLORIDE ER 8 MEQ PO TBCR: 16.0000 meq | EXTENDED_RELEASE_TABLET | Freq: Every day | ORAL | 1 refills | Status: DC

## 2018-07-27 ENCOUNTER — Ambulatory Visit: Payer: Medicare HMO | Admitting: Pharmacist

## 2018-07-27 ENCOUNTER — Ambulatory Visit: Payer: Medicare HMO | Attending: Family Medicine | Admitting: Pharmacist

## 2018-07-27 ENCOUNTER — Encounter: Payer: Self-pay | Admitting: Pharmacist

## 2018-07-27 DIAGNOSIS — Z87891 Personal history of nicotine dependence: Secondary | ICD-10-CM | POA: Insufficient documentation

## 2018-07-27 DIAGNOSIS — I10 Essential (primary) hypertension: Secondary | ICD-10-CM | POA: Insufficient documentation

## 2018-07-27 MED ORDER — FUROSEMIDE 40 MG PO TABS: 40.0000 mg | ORAL_TABLET | Freq: Every day | ORAL | 2 refills | Status: DC

## 2018-07-27 MED ORDER — METOPROLOL SUCCINATE ER 50 MG PO TB24: 50.0000 mg | ORAL_TABLET | Freq: Every day | ORAL | 2 refills | Status: DC

## 2018-07-27 NOTE — Progress Notes (Signed)
   S:    PCP: Dr. Wynetta Emery  Patient arrives in good spirits. Presents to the clinic for hypertension management. Patient was referred by Dr. Wynetta Emery on 07/20/2018. Amlodipine was added to regimen. Losartan continued and carvedilol d/c. Metoprolol started in place of carvedilol as pt reported diarrhea with carvedilol.   Denies chest pain, shortness of breath, HA or blurred vision. Reports resolution of diarrhea.   Patient reports adherence with medications.  Current BP Medications include: amlodipine 5 mg, Losartan 100 mg, furosemide 40 daily,Toprol XL 25 daily  Dietary habits include: drinks 1 cup of black tea daily, denies using salt Exercise habits include: doesn't exercise Family / Social history: former smoker (quit in 2008); has 1-2 drinks over the weekend  Home BP readings:  - brings list - SBPs: 150s - 170s - DBPs: 70s   O:  L arm after 5 minutes rest: 172/77, HR 80  Last 3 Office BP readings: BP Readings from Last 3 Encounters:  07/20/18 (!) 206/111  07/13/18 (!) 161/66  05/27/18 (!) 187/84   BMET    Component Value Date/Time   NA 140 07/13/2018 1358   NA 144 05/27/2018 1629   K 3.4 (L) 07/20/2018 0943   CL 105 07/13/2018 1358   CO2 28 07/13/2018 1358   GLUCOSE 104 (H) 07/13/2018 1358   BUN 6 (L) 07/13/2018 1358   BUN 11 05/27/2018 1629   CREATININE 0.89 07/13/2018 1358   CREATININE 1.35 (H) 04/21/2016 1437   CALCIUM 8.8 (L) 07/13/2018 1358   GFRNONAA >60 07/13/2018 1358   GFRNONAA 39 (L) 04/21/2016 1437   GFRAA >60 07/13/2018 1358   GFRAA 45 (L) 04/21/2016 1437   Renal function: Estimated Creatinine Clearance: 52.4 mL/min (by C-G formula based on SCr of 0.89 mg/dL).  Clinical ASCVD: Yes  The ASCVD Risk score Mikey Bussing DC Jr., et al., 2013) failed to calculate for the following reasons:   The valid systolic blood pressure range is 90 to 200 mmHg  A/P: Hypertension longstanding currently uncontrolled on current medications. BP Goal <130/80 mmHg. Patient is  adherent with current medications. Discussed with PCP. We are running out of options at this point. Will have pt increase Toprol to 50 mg XL. Recommend to add spironolactone if pt's BP is poorly controlled in the future.  -Increased dose of Toprol to 50 mg daily.  -Continued other medications  -Counseled on lifestyle modifications for blood pressure control including reduced dietary sodium, increased exercise, adequate sleep  Results reviewed and written information provided. Total time in face-to-face counseling 15 minutes.   F/U with PCP 08/06/2018.    Patient seen with:  Beckey Rutter, PharmD Candidate  [place] [employer] [facility]  Class of 2022  Benard Halsted, PharmD, [place] [phone]

## 2018-07-27 NOTE — Patient Instructions (Signed)
Thank you for coming to see [place] today.   Blood pressure today is improving.  Continue taking blood pressure medications. We are increasing your metoprolol dose to 50 mg. Take 1 tablet once a day.  Also, start taking your amlodipine before bedtime. Continue taking all other medications in the morning as you have been doing.  Limiting salt and caffeine, as well as exercising as able for at least 30 minutes for 5 days out of the week, can also help you lower your blood pressure.  Take your blood pressure at home if you are able. Please write down these numbers and bring them to your visits.  If you have any questions about medications, please call me [phone].  Lurena Joiner

## 2018-07-29 ENCOUNTER — Ambulatory Visit: Payer: Medicare HMO | Admitting: Internal Medicine

## 2018-08-02 ENCOUNTER — Telehealth: Payer: Self-pay

## 2018-08-02 NOTE — Telephone Encounter (Signed)
Contacted pt to go over lab results pt didn't answer and was unable to lvm

## 2018-08-05 DIAGNOSIS — R809 Proteinuria, unspecified: Secondary | ICD-10-CM | POA: Diagnosis not present

## 2018-08-05 DIAGNOSIS — E213 Hyperparathyroidism, unspecified: Secondary | ICD-10-CM | POA: Diagnosis not present

## 2018-08-05 DIAGNOSIS — I129 Hypertensive chronic kidney disease with stage 1 through stage 4 chronic kidney disease, or unspecified chronic kidney disease: Secondary | ICD-10-CM | POA: Diagnosis not present

## 2018-08-05 DIAGNOSIS — N183 Chronic kidney disease, stage 3 (moderate): Secondary | ICD-10-CM | POA: Diagnosis not present

## 2018-08-06 ENCOUNTER — Ambulatory Visit: Payer: 59 | Attending: Internal Medicine | Admitting: Internal Medicine

## 2018-08-06 ENCOUNTER — Encounter: Payer: Self-pay | Admitting: Internal Medicine

## 2018-08-06 VITALS — BP 145/83 | HR 76 | Temp 98.3°F | Resp 16

## 2018-08-06 DIAGNOSIS — R809 Proteinuria, unspecified: Secondary | ICD-10-CM | POA: Diagnosis not present

## 2018-08-06 DIAGNOSIS — E210 Primary hyperparathyroidism: Secondary | ICD-10-CM

## 2018-08-06 DIAGNOSIS — I10 Essential (primary) hypertension: Secondary | ICD-10-CM

## 2018-08-06 MED ORDER — METOPROLOL SUCCINATE ER 25 MG PO TB24: ORAL_TABLET | ORAL | 3 refills | Status: DC

## 2018-08-06 NOTE — Patient Instructions (Addendum)
Hold off on increasing amlodipine.  Stay on the 5 mg daily. Increase metoprolol to 75 mg daily.  This means he will take a 50 mg tablet along with a 25 mg tablet daily.  I have sent the prescription to [facility]..  Continue to monitor your blood pressure.  Goal is 130/80 or lower.

## 2018-08-06 NOTE — Progress Notes (Signed)
Patient ID: Tina Waller, female    DOB: [birth year]  MRN: [id number]  CC: Hypertension   Subjective: Tina Waller is a 78 y.o. female who presents for short-term follow-up on blood pressure. Her concerns today include:  Patient with history ofHTN,HL, vit D deficiency, osteoporosis on Dexascandone fall 2017, depression,CVA in 2008 and gout.  HTN: On last visit we stopped carvedilol as it was causing diarrhea and placed her back on metoprolol.  She saw our clinical pharmacist in follow-up about a week ago and dose of metoprolol increased from 25 mg to 50 mg a day.  She states that she is tolerating this dose.   Saw nephrologist Dr. Lorrene Reid yesterday.  Who gave her a note to give to me.  She states that the patient's blood pressure was 180/90.  She increased amlodipine to 10 mg daily.  However patient has not picked up that prescription as yet.  She is still taking the 5 mg.  Her nephrologist also wanted me to be aware of 2 unresolved issues that she is addressing that include proteinuria close to 1 g and PTH elevation with normal calcium.  She suspects primary hyperparathyroid.  She drew some blood tests on the visit yesterday and states that she will forward the results when available.  If repeat PTH still elevated she recommends getting a sestamibi study as likely primary hyperparathyroidism.   -Patient checks blood pressure twice a day in the mornings before taking her medications and at nights.  She has a log of some of these blood pressure readings with her.  Some of these readings are 180/88, 202/86, 165/83, 148/77, 164/80, 144/75 150/81  Patient referred to GI on last visit.  She has not received a call as yet from [facility] gastroenterology.  However patient states that she prefer to go to the [facility] rather than [facility]. Patient Active Problem List   Diagnosis Date Noted  . Primary hyperparathyroidism ([place]) 02/10/2018  . Vitamin B 12 deficiency 12/11/2017  . Pulmonary  hypertension, mild ([place]) 12/11/2017  . CKD (chronic kidney disease) stage 3, GFR 30-59 ml/min (HCC) 04/17/2017  . Stasis dermatitis of both legs 04/17/2017  . Osteoporosis 12/22/2016  . Hx of gout 12/22/2016  . Estrogen deficiency 05/12/2016  . Vitamin D deficiency 05/12/2016  . Weight loss 05/12/2016  . HTN (hypertension) 06/12/2015  . History of CVA (cerebrovascular accident) 03/28/2015     Current Outpatient Medications on File Prior to Visit  Medication Sig Dispense Refill  . allopurinol (ZYLOPRIM) 100 MG tablet Take 1 tablet (100 mg total) by mouth daily. (Patient not taking: Reported on 07/13/2018) 90 tablet 3  . amLODipine (NORVASC) 5 MG tablet Take 1 tablet (5 mg total) by mouth daily. 30 tablet 0  . aspirin EC 81 MG tablet Take 81 mg by mouth daily.    . Cyanocobalamin (VITAMIN B 12 PO) Take 1 tablet by mouth daily.    . furosemide (LASIX) 40 MG tablet Take 1 tablet (40 mg total) by mouth daily. 30 tablet 2  . ibandronate (BONIVA) 150 MG tablet Take 1 tablet (150 mg total) by mouth every 30 (thirty) days. Take with a full glass of water, on an empty stomach, and stay upright for 30 min. (Patient not taking: Reported on 07/13/2018) 3 tablet 3  . losartan (COZAAR) 100 MG tablet Take 1 tablet (100 mg total) by mouth daily. 90 tablet 3  . metoprolol succinate (TOPROL-XL) 50 MG 24 hr tablet Take 1 tablet (50 mg total) by mouth  daily. Take with or immediately following a meal. 30 tablet 2  . polyethylene glycol (MIRALAX) packet Take 17 g by mouth daily as needed. 10 each 0  . potassium chloride (KLOR-CON) 8 MEQ tablet Take 2 tablets (16 mEq total) by mouth daily. 180 tablet 1  . triamcinolone cream (KENALOG) 0.1 % Apply 1 application topically 2 (two) times daily. 80 g 1  . VITAMIN D PO Take 1 tablet by mouth daily.     Current Facility-Administered Medications on File Prior to Visit  Medication Dose Route Frequency Provider Last Rate Last Dose  . 0.9 %  sodium chloride infusion  500  mL Intravenous Continuous Doran Stabler, MD        Allergies  Allergen Reactions  . Lipitor [Atorvastatin] Diarrhea  . Lisinopril Cough    Social History   Socioeconomic History  . Marital status: Divorced    Spouse name: Not on file  . Number of children: Not on file  . Years of education: Not on file  . Highest education level: Not on file  Occupational History  . Not on file  Social Needs  . Financial resource strain: Not on file  . Food insecurity:    Worry: Not on file    Inability: Not on file  . Transportation needs:    Medical: Not on file    Non-medical: Not on file  Tobacco Use  . Smoking status: Former Smoker    Last attempt to quit: 07/14/2006    Years since quitting: 12.0  . Smokeless tobacco: Never Used  Substance and Sexual Activity  . Alcohol use: Yes    Alcohol/week: 7.0 standard drinks    Types: 7 Shots of liquor per week    Comment: wine and vodka  . Drug use: No  . Sexual activity: Not on file  Lifestyle  . Physical activity:    Days per week: Not on file    Minutes per session: Not on file  . Stress: Not on file  Relationships  . Social connections:    Talks on phone: Not on file    Gets together: Not on file    Attends religious service: Not on file    Active member of club or organization: Not on file    Attends meetings of clubs or organizations: Not on file    Relationship status: Not on file  . Intimate partner violence:    Fear of current or ex partner: Not on file    Emotionally abused: Not on file    Physically abused: Not on file    Forced sexual activity: Not on file  Other Topics Concern  . Not on file  Social History Narrative  . Not on file    Family History  Problem Relation Age of Onset  . Heart disease Mother   . Osteoporosis Neg Hx     Past Surgical History:  Procedure Laterality Date  . ABDOMINAL HYSTERECTOMY    . CATARACT EXTRACTION Left     ROS: Review of Systems Negative except as  above. PHYSICAL EXAM: BP (!) 145/83   Pulse 76   Temp 98.3 F (36.8 C) (Oral)   Resp 16   SpO2 96%   Repeat blood pressure today is 160/80 in both upper extremities. Physical Exam  General appearance - alert, well appearing, and in no distress Mental status - normal mood, behavior, speech, dress, motor activity, and thought processes Chest - clear to auscultation, no wheezes, rales or rhonchi, symmetric  air entry Heart - normal rate, regular rhythm, normal S1, S2, no murmurs, rubs, clicks or gallops Extremities -no edema in the lower legs.  She has trace to 1+ edema in the ankles.    ASSESSMENT AND PLAN: 1. Essential hypertension I would prefer not to increase the amlodipine if she is able to tolerate a higher dose of metoprolol instead.  In the past she had worsening lower extremity edema on 10 mg of amlodipine. -We will have her increase metoprolol to 75 mg daily.  I have sent a prescription to her pharmacy for the 25 mg and she will take 1 tablet of this with 1 of the 50 mg tablets. - metoprolol succinate (TOPROL-XL) 25 MG 24 hr tablet; Take 1 tab PO daily with a 50 mg tablet for a total of 75 mg daily  Dispense: 90 tablet; Refill: 3  2. Proteinuria, unspecified type Being worked up by nephrology.  3. Primary hyperparathyroidism ([place]) We will await repeat blood test result done by nephrology.  If still elevated will probably refer to endocrinology.  I have sent a referral person a message informing her that patient wishes gastroenterology referral to be with [facility].   Patient was given the opportunity to ask questions.  Patient verbalized understanding of the plan and was able to repeat key elements of the plan.   No orders of the defined types were placed in this encounter.    Requested Prescriptions   Signed Prescriptions Disp Refills  . metoprolol succinate (TOPROL-XL) 25 MG 24 hr tablet 90 tablet 3    Sig: Take 1 tab PO daily with a 50 mg tablet for a total of 75  mg daily    Return in about 1 month (around 09/06/2018).  Karle Plumber, MD, FACP

## 2018-08-12 ENCOUNTER — Other Ambulatory Visit: Payer: Self-pay | Admitting: Nephrology

## 2018-08-12 DIAGNOSIS — E213 Hyperparathyroidism, unspecified: Secondary | ICD-10-CM

## 2018-08-16 DIAGNOSIS — R809 Proteinuria, unspecified: Secondary | ICD-10-CM | POA: Diagnosis not present

## 2018-08-20 ENCOUNTER — Encounter (HOSPITAL_COMMUNITY): Admission: RE | Admit: 2018-08-20 | Payer: 59 | Source: Ambulatory Visit

## 2018-08-20 ENCOUNTER — Encounter (HOSPITAL_COMMUNITY): Payer: Self-pay

## 2018-08-20 ENCOUNTER — Encounter (HOSPITAL_COMMUNITY): Payer: 59 | Attending: Nephrology

## 2018-08-25 ENCOUNTER — Encounter: Payer: Self-pay | Admitting: Internal Medicine

## 2018-08-25 NOTE — Progress Notes (Signed)
Received note from patient's nephrologist Dr. Lorrene Reid.  She is evaluating patient's proteinuria and elevated PTH.  All serologies regarding proteinuria work-up negative on normal.  PTH was 99 and vitamin D level was low at 26.9.  She ordered a sestamibi scan on the parathyroid gland.  Other labs reveal creatinine of 1.12 with a GFR of 56

## 2018-08-30 ENCOUNTER — Encounter (HOSPITAL_COMMUNITY)
Admission: RE | Admit: 2018-08-30 | Discharge: 2018-08-30 | Disposition: A | Discharge status: Home / Self Care | Payer: Medicare HMO | Source: Ambulatory Visit | Attending: Nephrology | Admitting: Nephrology

## 2018-08-30 ENCOUNTER — Ambulatory Visit (HOSPITAL_COMMUNITY)
Admission: RE | Admit: 2018-08-30 | Discharge: 2018-08-30 | Disposition: A | Discharge status: Home / Self Care | Payer: Medicare HMO | Source: Ambulatory Visit | Attending: Nephrology | Admitting: Nephrology

## 2018-08-30 DIAGNOSIS — E213 Hyperparathyroidism, unspecified: Secondary | ICD-10-CM | POA: Diagnosis not present

## 2018-08-30 MED ORDER — TECHNETIUM TC 99M SESTAMIBI - CARDIOLITE
24.0000 | Freq: Once | INTRAVENOUS | Status: AC | PRN
  Administered 2018-08-30: 09:00:00 24 via INTRAVENOUS

## 2018-09-06 ENCOUNTER — Ambulatory Visit: Payer: Medicare HMO | Attending: Internal Medicine | Admitting: Internal Medicine

## 2018-09-06 ENCOUNTER — Encounter: Payer: Self-pay | Admitting: Internal Medicine

## 2018-09-06 VITALS — BP 186/82 | HR 70 | Temp 98.3°F | Resp 16 | Wt 137.8 lb

## 2018-09-06 DIAGNOSIS — I10 Essential (primary) hypertension: Secondary | ICD-10-CM | POA: Diagnosis not present

## 2018-09-06 DIAGNOSIS — R809 Proteinuria, unspecified: Secondary | ICD-10-CM

## 2018-09-06 DIAGNOSIS — M810 Age-related osteoporosis without current pathological fracture: Secondary | ICD-10-CM

## 2018-09-06 DIAGNOSIS — I272 Pulmonary hypertension, unspecified: Secondary | ICD-10-CM

## 2018-09-06 DIAGNOSIS — E210 Primary hyperparathyroidism: Secondary | ICD-10-CM

## 2018-09-06 NOTE — Progress Notes (Signed)
Patient ID: Tina Waller, female    DOB: 1940/07/27  MRN: [id number]  CC: Hypertension   Subjective: Tina Waller is a 78 y.o. female who presents for f/u HTN Her concerns today include:  Patient with history ofHTN,HL, vit D deficiency, osteoporosis on Dexascandone fall 2017, depression,CVA in 2008 and gout.  Primary hyperparathyroid: Patient was seen by her nephrologist Dr. Lorrene Reid.  She is found to have persistent elevation of PTH.  Diagnosed with primary hyperparathyroidism.  Patient had parathyroid scintigraphy which revealed normal uptake with no evidence of parathyroid adenoma. Patient with history of osteoporosis.  She had seen Dr. Loanne Drilling in the past and was placed on Boniva once a mth.  However patient states that she has been off the medication for a while and she does not recall why.  HTN: still checks BP daily and brings log. From the 14th of this mth, BP range SBP 136-155 with most in the 140s and DBP 70-80s.  This a.m was 144/88 We inc Metoptolol to 75 mg daily on last visit.  She had the 50 mg tablets at home and I sent a prescription for the 25 mg tablet to her mail delivery pharmacy.  Patient was to take a 50 mg tablet with a 25 mg tablet daily for a total of 75 mg.  She is not sure that she is doing that. Reports compliance with Cozaar and amlodipine. Mild pulmonary hypertension on echo done last year. She limits salt in the foods.  She denies any chest pains or shortness of breath.  Mild edema in the ankles. Patient Active Problem List   Diagnosis Date Noted  . Primary hyperparathyroidism ([place]) 02/10/2018  . Vitamin B 12 deficiency 12/11/2017  . Pulmonary hypertension, mild ([place]) 12/11/2017  . CKD (chronic kidney disease) stage 3, GFR 30-59 ml/min (HCC) 04/17/2017  . Stasis dermatitis of both legs 04/17/2017  . Osteoporosis 12/22/2016  . Hx of gout 12/22/2016  . Estrogen deficiency 05/12/2016  . Vitamin D deficiency 05/12/2016  . Weight loss  05/12/2016  . HTN (hypertension) 06/12/2015  . History of CVA (cerebrovascular accident) 03/28/2015     Current Outpatient Medications on File Prior to Visit  Medication Sig Dispense Refill  . amLODipine (NORVASC) 5 MG tablet Take 1 tablet (5 mg total) by mouth daily. 30 tablet 0  . aspirin EC 81 MG tablet Take 81 mg by mouth daily.    . Cyanocobalamin (VITAMIN B 12 PO) Take 1 tablet by mouth daily.    . furosemide (LASIX) 40 MG tablet Take 1 tablet (40 mg total) by mouth daily. 30 tablet 2  . ibandronate (BONIVA) 150 MG tablet Take 1 tablet (150 mg total) by mouth every 30 (thirty) days. Take with a full glass of water, on an empty stomach, and stay upright for 30 min. (Patient not taking: Reported on 07/13/2018) 3 tablet 3  . losartan (COZAAR) 100 MG tablet Take 1 tablet (100 mg total) by mouth daily. 90 tablet 3  . metoprolol succinate (TOPROL-XL) 25 MG 24 hr tablet Take 1 tab PO daily with a 50 mg tablet for a total of 75 mg daily 90 tablet 3  . metoprolol succinate (TOPROL-XL) 50 MG 24 hr tablet Take 1 tablet (50 mg total) by mouth daily. Take with or immediately following a meal. 30 tablet 2  . polyethylene glycol (MIRALAX) packet Take 17 g by mouth daily as needed. 10 each 0  . potassium chloride (KLOR-CON) 8 MEQ tablet Take 2 tablets (16 mEq  total) by mouth daily. 180 tablet 1  . triamcinolone cream (KENALOG) 0.1 % Apply 1 application topically 2 (two) times daily. 80 g 1  . VITAMIN D PO Take 1 tablet by mouth daily.     Current Facility-Administered Medications on File Prior to Visit  Medication Dose Route Frequency Provider Last Rate Last Dose  . 0.9 %  sodium chloride infusion  500 mL Intravenous Continuous Doran Stabler, MD        Allergies  Allergen Reactions  . Lipitor [Atorvastatin] Diarrhea  . Lisinopril Cough    Social History   Socioeconomic History  . Marital status: Divorced    Spouse name: Not on file  . Number of children: Not on file  . Years of  education: Not on file  . Highest education level: Not on file  Occupational History  . Not on file  Social Needs  . Financial resource strain: Not on file  . Food insecurity:    Worry: Not on file    Inability: Not on file  . Transportation needs:    Medical: Not on file    Non-medical: Not on file  Tobacco Use  . Smoking status: Former Smoker    Last attempt to quit: 07/14/2006    Years since quitting: 12.1  . Smokeless tobacco: Never Used  Substance and Sexual Activity  . Alcohol use: Yes    Alcohol/week: 7.0 standard drinks    Types: 7 Shots of liquor per week    Comment: wine and vodka  . Drug use: No  . Sexual activity: Not on file  Lifestyle  . Physical activity:    Days per week: Not on file    Minutes per session: Not on file  . Stress: Not on file  Relationships  . Social connections:    Talks on phone: Not on file    Gets together: Not on file    Attends religious service: Not on file    Active member of club or organization: Not on file    Attends meetings of clubs or organizations: Not on file    Relationship status: Not on file  . Intimate partner violence:    Fear of current or ex partner: Not on file    Emotionally abused: Not on file    Physically abused: Not on file    Forced sexual activity: Not on file  Other Topics Concern  . Not on file  Social History Narrative  . Not on file    Family History  Problem Relation Age of Onset  . Heart disease Mother   . Osteoporosis Neg Hx     Past Surgical History:  Procedure Laterality Date  . ABDOMINAL HYSTERECTOMY    . CATARACT EXTRACTION Left     ROS: Review of Systems Negative except as stated above  PHYSICAL EXAM: BP (!) 186/82   Pulse 70   Temp 98.3 F (36.8 C) (Oral)   Resp 16   Wt 137 lb 12.8 oz (62.5 kg)   SpO2 95%   BMI 20.35 kg/m   Wt Readings from Last 3 Encounters:  09/06/18 137 lb 12.8 oz (62.5 kg)  07/20/18 134 lb (60.8 kg)  07/13/18 125 lb (56.7 kg)    Physical  Exam  General appearance - alert, well appearing, and in no distress Mental status - normal mood, behavior, speech, dress, motor activity, and thought processes Chest - clear to auscultation, no wheezes, rales or rhonchi, symmetric air entry Heart - normal rate,  regular rhythm, normal S1, S2, no murmurs, rubs, clicks or gallops Extremities -trace lower extremity and ankle edema  CMP Latest Ref Rng & Units 07/20/2018 07/13/2018 05/27/2018  Glucose 70 - 99 mg/dL - 104(H) 94  BUN 8 - 23 mg/dL - 6(L) 11  Creatinine 0.44 - 1.00 mg/dL - 0.89 0.94  Sodium 135 - 145 mmol/L - 140 144  Potassium 3.5 - 5.2 mmol/L 3.4(L) 3.4(L) 3.5  Chloride 98 - 111 mmol/L - 105 99  CO2 22 - 32 mmol/L - 28 25  Calcium 8.9 - 10.3 mg/dL - 8.8(L) 8.9  Total Protein 6.0 - 8.5 g/dL - - 6.8  Total Bilirubin 0.0 - 1.2 mg/dL - - 0.7  Alkaline Phos 39 - 117 IU/L - - 121(H)  AST 0 - 40 IU/L - - 20  ALT 0 - 32 IU/L - - 7   Lipid Panel     Component Value Date/Time   CHOL 196 04/21/2016 1437   TRIG 139 04/21/2016 1437   HDL 59 04/21/2016 1437   CHOLHDL 3.3 04/21/2016 1437   VLDL 28 04/21/2016 1437   LDLCALC 109 04/21/2016 1437    CBC    Component Value Date/Time   WBC 5.3 07/13/2018 1358   RBC 3.58 (L) 07/13/2018 1358   HGB 13.0 07/13/2018 1358   HGB 12.9 11/05/2017 1559   HCT 38.1 07/13/2018 1358   HCT 37.3 11/05/2017 1559   PLT 294 07/13/2018 1358   PLT 443 (H) 11/05/2017 1559   MCV 106.4 (H) 07/13/2018 1358   MCV 102 (H) 11/05/2017 1559   MCH 36.3 (H) 07/13/2018 1358   MCHC 34.1 07/13/2018 1358   RDW 12.3 07/13/2018 1358   RDW 16.7 (H) 11/05/2017 1559   LYMPHSABS 1.0 11/12/2016 1132   MONOABS 611 04/21/2016 1437   EOSABS 0.1 11/12/2016 1132   BASOSABS 0.0 11/12/2016 1132    ASSESSMENT AND PLAN: 1. Essential hypertension Blood pressure in the office today is elevated.  However home blood pressure log shows improvement in blood pressure readings with systolic in the 947M and diastolic 54Y to  50P. I will call patient later this afternoon or tomorrow to verify she is taking the metoprolol 75 mg total daily  2. Pulmonary hypertension, mild (HCC) Stable  3. Primary hyperparathyroidism ([place]) 4. Osteoporosis, unspecified osteoporosis type, unspecified pathological fracture presence We will get her back to Dr. Loanne Drilling - Ambulatory referral to Endocrinology   5. Proteinuria, unspecified type Being evaluated by nephrology    Patient was given the opportunity to ask questions.  Patient verbalized understanding of the plan and was able to repeat key elements of the plan.   Orders Placed This Encounter  Procedures  . Ambulatory referral to Endocrinology     Requested Prescriptions    No prescriptions requested or ordered in this encounter    No follow-ups on file.  Karle Plumber, MD, FACP

## 2018-09-13 ENCOUNTER — Telehealth: Payer: Self-pay | Admitting: Internal Medicine

## 2018-09-13 ENCOUNTER — Inpatient Hospital Stay (HOSPITAL_COMMUNITY)
Admission: EM | Admit: 2018-09-13 | Discharge: 2018-09-15 | DRG: 066 | Disposition: A | Discharge status: Home Health | Payer: Medicare HMO | Attending: Internal Medicine | Admitting: Internal Medicine

## 2018-09-13 DIAGNOSIS — Z8249 Family history of ischemic heart disease and other diseases of the circulatory system: Secondary | ICD-10-CM

## 2018-09-13 DIAGNOSIS — Z888 Allergy status to other drugs, medicaments and biological substances status: Secondary | ICD-10-CM

## 2018-09-13 DIAGNOSIS — Z87891 Personal history of nicotine dependence: Secondary | ICD-10-CM

## 2018-09-13 DIAGNOSIS — I700 Atherosclerosis of aorta: Secondary | ICD-10-CM | POA: Diagnosis present

## 2018-09-13 DIAGNOSIS — I160 Hypertensive urgency: Secondary | ICD-10-CM | POA: Diagnosis not present

## 2018-09-13 DIAGNOSIS — I10 Essential (primary) hypertension: Secondary | ICD-10-CM

## 2018-09-13 DIAGNOSIS — Z7982 Long term (current) use of aspirin: Secondary | ICD-10-CM | POA: Diagnosis not present

## 2018-09-13 DIAGNOSIS — M109 Gout, unspecified: Secondary | ICD-10-CM | POA: Diagnosis not present

## 2018-09-13 DIAGNOSIS — E210 Primary hyperparathyroidism: Secondary | ICD-10-CM | POA: Diagnosis not present

## 2018-09-13 DIAGNOSIS — F419 Anxiety disorder, unspecified: Secondary | ICD-10-CM | POA: Diagnosis present

## 2018-09-13 DIAGNOSIS — I361 Nonrheumatic tricuspid (valve) insufficiency: Secondary | ICD-10-CM | POA: Diagnosis not present

## 2018-09-13 DIAGNOSIS — N183 Chronic kidney disease, stage 3 unspecified: Secondary | ICD-10-CM | POA: Diagnosis present

## 2018-09-13 DIAGNOSIS — R402362 Coma scale, best motor response, obeys commands, at arrival to emergency department: Secondary | ICD-10-CM | POA: Diagnosis present

## 2018-09-13 DIAGNOSIS — R42 Dizziness and giddiness: Secondary | ICD-10-CM

## 2018-09-13 DIAGNOSIS — I272 Pulmonary hypertension, unspecified: Secondary | ICD-10-CM | POA: Diagnosis present

## 2018-09-13 DIAGNOSIS — J439 Emphysema, unspecified: Secondary | ICD-10-CM | POA: Diagnosis present

## 2018-09-13 DIAGNOSIS — I639 Cerebral infarction, unspecified: Secondary | ICD-10-CM | POA: Diagnosis not present

## 2018-09-13 DIAGNOSIS — I129 Hypertensive chronic kidney disease with stage 1 through stage 4 chronic kidney disease, or unspecified chronic kidney disease: Secondary | ICD-10-CM | POA: Diagnosis present

## 2018-09-13 DIAGNOSIS — R29703 NIHSS score 3: Secondary | ICD-10-CM | POA: Diagnosis present

## 2018-09-13 DIAGNOSIS — I63531 Cerebral infarction due to unspecified occlusion or stenosis of right posterior cerebral artery: Secondary | ICD-10-CM | POA: Diagnosis not present

## 2018-09-13 DIAGNOSIS — E1122 Type 2 diabetes mellitus with diabetic chronic kidney disease: Secondary | ICD-10-CM | POA: Diagnosis present

## 2018-09-13 DIAGNOSIS — Z8673 Personal history of transient ischemic attack (TIA), and cerebral infarction without residual deficits: Secondary | ICD-10-CM

## 2018-09-13 DIAGNOSIS — I6782 Cerebral ischemia: Secondary | ICD-10-CM | POA: Diagnosis not present

## 2018-09-13 DIAGNOSIS — E1151 Type 2 diabetes mellitus with diabetic peripheral angiopathy without gangrene: Secondary | ICD-10-CM | POA: Diagnosis not present

## 2018-09-13 DIAGNOSIS — R402142 Coma scale, eyes open, spontaneous, at arrival to emergency department: Secondary | ICD-10-CM | POA: Diagnosis present

## 2018-09-13 DIAGNOSIS — R402252 Coma scale, best verbal response, oriented, at arrival to emergency department: Secondary | ICD-10-CM | POA: Diagnosis present

## 2018-09-13 DIAGNOSIS — R55 Syncope and collapse: Secondary | ICD-10-CM | POA: Diagnosis not present

## 2018-09-13 DIAGNOSIS — I6523 Occlusion and stenosis of bilateral carotid arteries: Secondary | ICD-10-CM | POA: Diagnosis not present

## 2018-09-13 DIAGNOSIS — E785 Hyperlipidemia, unspecified: Secondary | ICD-10-CM | POA: Diagnosis present

## 2018-09-13 DIAGNOSIS — L309 Dermatitis, unspecified: Secondary | ICD-10-CM

## 2018-09-13 DIAGNOSIS — E119 Type 2 diabetes mellitus without complications: Secondary | ICD-10-CM

## 2018-09-13 DIAGNOSIS — I6503 Occlusion and stenosis of bilateral vertebral arteries: Secondary | ICD-10-CM | POA: Diagnosis not present

## 2018-09-13 DIAGNOSIS — R3129 Other microscopic hematuria: Secondary | ICD-10-CM | POA: Diagnosis present

## 2018-09-13 DIAGNOSIS — R6889 Other general symptoms and signs: Secondary | ICD-10-CM

## 2018-09-13 HISTORY — DX: Type 2 diabetes mellitus without complications: E11.9

## 2018-09-13 LAB — BASIC METABOLIC PANEL
Anion gap: 9 (ref 5–15)
BUN: 18 mg/dL (ref 8–23)
CALCIUM: 9.4 mg/dL (ref 8.9–10.3)
CO2: 24 mmol/L (ref 22–32)
Chloride: 104 mmol/L (ref 98–111)
Creatinine, Ser: 1.09 mg/dL — ABNORMAL HIGH (ref 0.44–1.00)
GFR calc Af Amer: 58 mL/min — ABNORMAL LOW (ref >60)
GFR, EST NON AFRICAN AMERICAN: 50 mL/min — AB (ref >60)
Glucose, Bld: 125 mg/dL — ABNORMAL HIGH (ref 70–99)
Potassium: 3.7 mmol/L (ref 3.5–5.1)
SODIUM: 137 mmol/L (ref 135–145)

## 2018-09-13 LAB — CBC
HCT: 38.3 % (ref 36.0–46.0)
Hemoglobin: 12.8 g/dL (ref 12.0–15.0)
MCH: 32 pg (ref 26.0–34.0)
MCHC: 33.4 g/dL (ref 30.0–36.0)
MCV: 95.8 fL (ref 80.0–100.0)
Platelets: 275 10*3/uL (ref 150–400)
RBC: 4 MIL/uL (ref 3.87–5.11)
RDW: 12 % (ref 11.5–15.5)
WBC: 7.1 10*3/uL (ref 4.0–10.5)
nRBC: 0 % (ref 0.0–0.2)

## 2018-09-13 LAB — URINALYSIS, ROUTINE W REFLEX MICROSCOPIC
Bilirubin Urine: NEGATIVE
Glucose, UA: NEGATIVE mg/dL
Ketones, ur: NEGATIVE mg/dL
Leukocytes,Ua: NEGATIVE
Nitrite: NEGATIVE
PH: 7 (ref 5.0–8.0)
Protein, ur: 100 mg/dL — AB
Specific Gravity, Urine: 1.009 (ref 1.005–1.030)

## 2018-09-13 MED ORDER — AMLODIPINE BESYLATE 5 MG PO TABS: 5.0000 mg | ORAL_TABLET | Freq: Every day | ORAL | 6 refills | Status: DC

## 2018-09-13 MED ORDER — SODIUM CHLORIDE 0.9% FLUSH: 3.0000 mL | Freq: Once | INTRAVENOUS | Status: DC

## 2018-09-13 NOTE — ED Triage Notes (Signed)
Pt arrived EMS from home with dizziness over that past week. Pt has had issues with her BP since November and has been working with her Dr to control her BP. Pt states she almost had a syncopal episode in her kitchen tonight. No LOC. Per EMS pt was slightly off balance with her gait.

## 2018-09-13 NOTE — Telephone Encounter (Signed)
Phone call placed to patient today as a follow-up from visit last week.  I wanted to make sure that she was taking the correct dose of the metoprolol which is supposed to be 75 mg daily.  Patient states that after our last visit she went home and checked her medicine cabinet.  She has the 50 mg tablets and 25 mg tablet but was only taking the 50 mg tablet and has continued to do so.  She has also been out of amlodipine 5 mg for 1 week and requests that I send a refill on that. She reports intermittent dizziness for the past 2 days.  Episodes can last up to 5 minutes where she has to sit down.  She states that the episodes are associated with abdominal cramping and urge to have a bowel movement.  After she has the bowel movement symptoms resolve.  She continues to check blood pressure daily.  Blood pressure this morning was 172/82 and yesterday morning was 180/83.  Her machine also gives the pulse reading but she does not write it down.  She states that she wanted to come out and be seen today but she is afraid to drive due to the dizzy spells and her daughter is not available to take her today.  I told patient that if dizziness is bothersome and she is not able to come in to be seen she can always call EMS to be transported to the emergency room.  Otherwise I will have our scheduler call her to try and get her in tomorrow or on Wednesday.  Patient is agreeable to this.

## 2018-09-14 ENCOUNTER — Observation Stay (HOSPITAL_COMMUNITY): Payer: Medicare HMO

## 2018-09-14 ENCOUNTER — Inpatient Hospital Stay (HOSPITAL_COMMUNITY): Payer: Medicare HMO

## 2018-09-14 ENCOUNTER — Other Ambulatory Visit: Payer: Self-pay

## 2018-09-14 ENCOUNTER — Emergency Department (HOSPITAL_COMMUNITY): Payer: Medicare HMO

## 2018-09-14 ENCOUNTER — Encounter (HOSPITAL_COMMUNITY): Payer: Self-pay | Admitting: Internal Medicine

## 2018-09-14 ENCOUNTER — Ambulatory Visit: Payer: Medicare HMO | Admitting: Internal Medicine

## 2018-09-14 DIAGNOSIS — Z888 Allergy status to other drugs, medicaments and biological substances status: Secondary | ICD-10-CM | POA: Diagnosis not present

## 2018-09-14 DIAGNOSIS — I639 Cerebral infarction, unspecified: Secondary | ICD-10-CM | POA: Diagnosis not present

## 2018-09-14 DIAGNOSIS — I160 Hypertensive urgency: Secondary | ICD-10-CM

## 2018-09-14 DIAGNOSIS — Z8249 Family history of ischemic heart disease and other diseases of the circulatory system: Secondary | ICD-10-CM | POA: Diagnosis not present

## 2018-09-14 DIAGNOSIS — E119 Type 2 diabetes mellitus without complications: Secondary | ICD-10-CM

## 2018-09-14 DIAGNOSIS — R3129 Other microscopic hematuria: Secondary | ICD-10-CM | POA: Diagnosis present

## 2018-09-14 DIAGNOSIS — I361 Nonrheumatic tricuspid (valve) insufficiency: Secondary | ICD-10-CM | POA: Diagnosis not present

## 2018-09-14 DIAGNOSIS — R42 Dizziness and giddiness: Secondary | ICD-10-CM

## 2018-09-14 DIAGNOSIS — I272 Pulmonary hypertension, unspecified: Secondary | ICD-10-CM | POA: Diagnosis present

## 2018-09-14 DIAGNOSIS — F419 Anxiety disorder, unspecified: Secondary | ICD-10-CM | POA: Diagnosis present

## 2018-09-14 DIAGNOSIS — Z7982 Long term (current) use of aspirin: Secondary | ICD-10-CM | POA: Diagnosis not present

## 2018-09-14 DIAGNOSIS — R402142 Coma scale, eyes open, spontaneous, at arrival to emergency department: Secondary | ICD-10-CM | POA: Diagnosis present

## 2018-09-14 DIAGNOSIS — I700 Atherosclerosis of aorta: Secondary | ICD-10-CM | POA: Diagnosis present

## 2018-09-14 DIAGNOSIS — E1122 Type 2 diabetes mellitus with diabetic chronic kidney disease: Secondary | ICD-10-CM | POA: Diagnosis present

## 2018-09-14 DIAGNOSIS — I129 Hypertensive chronic kidney disease with stage 1 through stage 4 chronic kidney disease, or unspecified chronic kidney disease: Secondary | ICD-10-CM | POA: Diagnosis present

## 2018-09-14 DIAGNOSIS — R29703 NIHSS score 3: Secondary | ICD-10-CM | POA: Diagnosis present

## 2018-09-14 DIAGNOSIS — E210 Primary hyperparathyroidism: Secondary | ICD-10-CM | POA: Diagnosis present

## 2018-09-14 DIAGNOSIS — M109 Gout, unspecified: Secondary | ICD-10-CM | POA: Diagnosis present

## 2018-09-14 DIAGNOSIS — I63531 Cerebral infarction due to unspecified occlusion or stenosis of right posterior cerebral artery: Secondary | ICD-10-CM | POA: Diagnosis present

## 2018-09-14 DIAGNOSIS — J439 Emphysema, unspecified: Secondary | ICD-10-CM | POA: Diagnosis present

## 2018-09-14 DIAGNOSIS — Z8673 Personal history of transient ischemic attack (TIA), and cerebral infarction without residual deficits: Secondary | ICD-10-CM | POA: Diagnosis not present

## 2018-09-14 DIAGNOSIS — R402362 Coma scale, best motor response, obeys commands, at arrival to emergency department: Secondary | ICD-10-CM | POA: Diagnosis present

## 2018-09-14 DIAGNOSIS — E785 Hyperlipidemia, unspecified: Secondary | ICD-10-CM | POA: Diagnosis present

## 2018-09-14 DIAGNOSIS — N183 Chronic kidney disease, stage 3 (moderate): Secondary | ICD-10-CM | POA: Diagnosis present

## 2018-09-14 DIAGNOSIS — R402252 Coma scale, best verbal response, oriented, at arrival to emergency department: Secondary | ICD-10-CM | POA: Diagnosis present

## 2018-09-14 DIAGNOSIS — E1151 Type 2 diabetes mellitus with diabetic peripheral angiopathy without gangrene: Secondary | ICD-10-CM | POA: Diagnosis present

## 2018-09-14 DIAGNOSIS — Z87891 Personal history of nicotine dependence: Secondary | ICD-10-CM | POA: Diagnosis not present

## 2018-09-14 HISTORY — DX: Type 2 diabetes mellitus without complications: E11.9

## 2018-09-14 LAB — I-STAT TROPONIN, ED: Troponin i, poc: 0.01 ng/mL (ref 0.00–0.08)

## 2018-09-14 LAB — ECHOCARDIOGRAM COMPLETE

## 2018-09-14 LAB — GLUCOSE, CAPILLARY
Glucose-Capillary: 86 mg/dL (ref 70–99)
Glucose-Capillary: 123 mg/dL — ABNORMAL HIGH (ref 70–99)
Glucose-Capillary: 81 mg/dL (ref 70–99)

## 2018-09-14 LAB — LIPID PANEL
Cholesterol: 215 mg/dL — ABNORMAL HIGH (ref 0–200)
HDL: 41 mg/dL (ref >40)
LDL CALC: 159 mg/dL — AB (ref 0–99)
Total CHOL/HDL Ratio: 5.2 RATIO
Triglycerides: 76 mg/dL (ref <150)
VLDL: 15 mg/dL (ref 0–40)

## 2018-09-14 LAB — TROPONIN I: Troponin I: <0.03 ng/mL (ref <0.03)

## 2018-09-14 LAB — MAGNESIUM: Magnesium: 1.8 mg/dL (ref 1.7–2.4)

## 2018-09-14 LAB — PHOSPHORUS: PHOSPHORUS: 3 mg/dL (ref 2.5–4.6)

## 2018-09-14 LAB — HEMOGLOBIN A1C
HEMOGLOBIN A1C: 5.6 % (ref 4.8–5.6)
Mean Plasma Glucose: 114.02 mg/dL

## 2018-09-14 MED ORDER — CLOPIDOGREL BISULFATE 75 MG PO TABS
75.0000 mg | ORAL_TABLET | Freq: Every day | ORAL | Status: DC
  Administered 2018-09-15: 75 mg via ORAL
  Filled 2018-09-14 (×2): qty 1

## 2018-09-14 MED ORDER — IOPAMIDOL (ISOVUE-370) INJECTION 76%
INTRAVENOUS | Status: AC
  Filled 2018-09-14: qty 100

## 2018-09-14 MED ORDER — LORAZEPAM 2 MG/ML IJ SOLN
0.5000 mg | Freq: Once | INTRAMUSCULAR | Status: AC
  Administered 2018-09-14: 0.5 mg via INTRAVENOUS
  Filled 2018-09-14: qty 1

## 2018-09-14 MED ORDER — ACETAMINOPHEN 325 MG PO TABS: 650.0000 mg | ORAL_TABLET | Freq: Four times a day (QID) | ORAL | Status: DC | PRN

## 2018-09-14 MED ORDER — ACETAMINOPHEN 160 MG/5ML PO SOLN: 650.0000 mg | ORAL | Status: DC | PRN

## 2018-09-14 MED ORDER — INSULIN ASPART 100 UNIT/ML ~~LOC~~ SOLN
0.0000 [IU] | Freq: Three times a day (TID) | SUBCUTANEOUS | Status: DC
  Administered 2018-09-14: 1 [IU] via SUBCUTANEOUS

## 2018-09-14 MED ORDER — ACETAMINOPHEN 325 MG PO TABS: 650.0000 mg | ORAL_TABLET | ORAL | Status: DC | PRN

## 2018-09-14 MED ORDER — HYDRALAZINE HCL 20 MG/ML IJ SOLN: 10.0000 mg | Freq: Once | INTRAMUSCULAR | Status: DC

## 2018-09-14 MED ORDER — CLOPIDOGREL BISULFATE 75 MG PO TABS
300.0000 mg | ORAL_TABLET | Freq: Once | ORAL | Status: AC
  Administered 2018-09-14: 300 mg via ORAL

## 2018-09-14 MED ORDER — ONDANSETRON HCL 4 MG PO TABS: 4.0000 mg | ORAL_TABLET | Freq: Four times a day (QID) | ORAL | Status: DC | PRN

## 2018-09-14 MED ORDER — ASPIRIN EC 81 MG PO TBEC: 81.0000 mg | DELAYED_RELEASE_TABLET | Freq: Every day | ORAL | Status: DC

## 2018-09-14 MED ORDER — IOPAMIDOL (ISOVUE-370) INJECTION 76%
75.0000 mL | Freq: Once | INTRAVENOUS | Status: AC | PRN
  Administered 2018-09-14: 75 mL via INTRAVENOUS

## 2018-09-14 MED ORDER — ENOXAPARIN SODIUM 40 MG/0.4ML ~~LOC~~ SOLN
40.0000 mg | SUBCUTANEOUS | Status: DC
  Administered 2018-09-14: 40 mg via SUBCUTANEOUS
  Filled 2018-09-14 (×2): qty 0.4

## 2018-09-14 MED ORDER — ENOXAPARIN SODIUM 40 MG/0.4ML ~~LOC~~ SOLN: 40.0000 mg | SUBCUTANEOUS | Status: DC

## 2018-09-14 MED ORDER — ONDANSETRON HCL 4 MG/2ML IJ SOLN: 4.0000 mg | Freq: Four times a day (QID) | INTRAMUSCULAR | Status: DC | PRN

## 2018-09-14 MED ORDER — POLYETHYLENE GLYCOL 3350 17 G PO PACK: 17.0000 g | PACK | Freq: Every day | ORAL | Status: DC | PRN

## 2018-09-14 MED ORDER — METOPROLOL TARTRATE 5 MG/5ML IV SOLN: 5.0000 mg | INTRAVENOUS | Status: DC | PRN

## 2018-09-14 MED ORDER — ASPIRIN EC 81 MG PO TBEC
81.0000 mg | DELAYED_RELEASE_TABLET | Freq: Every day | ORAL | Status: DC
  Administered 2018-09-14 – 2018-09-15 (×2): 81 mg via ORAL
  Filled 2018-09-14 (×2): qty 1

## 2018-09-14 MED ORDER — ACETAMINOPHEN 650 MG RE SUPP: 650.0000 mg | RECTAL | Status: DC | PRN

## 2018-09-14 MED ORDER — STROKE: EARLY STAGES OF RECOVERY BOOK: Freq: Once | Status: DC

## 2018-09-14 MED ORDER — ACETAMINOPHEN 650 MG RE SUPP: 650.0000 mg | Freq: Four times a day (QID) | RECTAL | Status: DC | PRN

## 2018-09-14 NOTE — ED Notes (Signed)
Ordered bfast tray 

## 2018-09-14 NOTE — Evaluation (Signed)
Speech Language Pathology Evaluation Patient Details Name: Tina Waller MRN: [id number] DOB: 1941-01-10 Today's Date: 09/14/2018 Time: 2751-7001 SLP Time Calculation (min) (ACUTE ONLY): 13 min  Problem List:  Patient Active Problem List   Diagnosis Date Noted  . Hypertensive urgency 09/14/2018  . Type 2 diabetes mellitus ([place]) 09/14/2018  . Anxiety 09/14/2018  . Acute CVA (cerebrovascular accident) ([place]) 09/14/2018  . Primary hyperparathyroidism ([place]) 02/10/2018  . Vitamin B 12 deficiency 12/11/2017  . Pulmonary hypertension, mild ([place]) 12/11/2017  . CKD (chronic kidney disease) stage 3, GFR 30-59 ml/min (HCC) 04/17/2017  . Stasis dermatitis of both legs 04/17/2017  . Osteoporosis 12/22/2016  . Hx of gout 12/22/2016  . Estrogen deficiency 05/12/2016  . Vitamin D deficiency 05/12/2016  . Weight loss 05/12/2016  . HTN (hypertension) 06/12/2015  . History of CVA (cerebrovascular accident) 03/28/2015   Past Medical History:  Past Medical History:  Diagnosis Date  . Anemia   . Anxiety   . Cataract   . Depression   . Diabetes mellitus without complication ([place])    pt denies  . Gout   . Hypertension   . Stroke [facility]) 2008  . Type 2 diabetes mellitus ([place]) 09/14/2018   Past Surgical History:  Past Surgical History:  Procedure Laterality Date  . ABDOMINAL HYSTERECTOMY    . BUNIONECTOMY    . CATARACT EXTRACTION Left    HPI:  Tina Waller is a 78 y.o. female with medical history significant of anemia, anxiety, depression, cataracts, type 2 diabetes, gout, hypertension, history of CVA who is coming to the emergency department with complaints of on and off postural dizziness for the past week.  She states that back in December she was unable to take 1 of her blood pressure medications for about 2 weeks.  Since then, she has experienced significant trouble controlling her blood pressure.  Her blood pressure also fluctuates very widely.  She thinks that she was taken off the  losartan 100 mg p.o. daily.  About 6 months ago, she was taken off amlodipine 5 mg p.o. daily, but recently her PCP restarted this medication.  MRI revealed small acute infarct in the right cerebellum and remote small vessel infarcts as described, progressed from 2008.   Assessment / Plan / Recommendation Clinical Impression  Pt lives alone and her baseline is unknown as she is sole reporter. It is unknown if pt was safe within home environment. During this evaluation, pt doesn't display any acute cognitive linguistic deficits but she does display increased overall slow processing but is appropriate when performing tasks. Although no acute deficits were identified, this Probation officer is concerned about pt returning home alone with only a neighbor who intermittently checks on her. Concern conveyed to evaluating PT who will keep this in mind when assessing pt and making follow up recommendations. ST to sign off at this time.      SLP Assessment  SLP Recommendation/Assessment: Patient does not need any further Speech Lanaguage Pathology Services SLP Visit Diagnosis: Cognitive communication deficit (R41.841)    Follow Up Recommendations  None          SLP Evaluation Cognition  Overall Cognitive Status: Difficult to assess Arousal/Alertness: Awake/alert Orientation Level: Oriented X4       Comprehension  Auditory Comprehension Overall Auditory Comprehension: Appears within functional limits for tasks assessed Visual Recognition/Discrimination Discrimination: Not tested Reading Comprehension Reading Status: Not tested    Expression Expression Primary Mode of Expression: Verbal Verbal Expression Overall Verbal Expression: Appears within functional limits  for tasks assessed   Oral / Motor  Oral Motor/Sensory Function Overall Oral Motor/Sensory Function: Within functional limits Motor Speech Overall Motor Speech: Appears within functional limits for tasks assessed   GO                     Avelardo Reesman 09/14/2018, 1:43 PM

## 2018-09-14 NOTE — Progress Notes (Signed)
PT Cancellation Note  Patient Details Name: Tina Waller MRN: [id number] DOB: 1940-11-26   Cancelled Treatment:    Reason Eval/Treat Not Completed: Patient declined, no reason specified attempted to work with patient, she politely declines PT as she just received her lunch. Will attempt to try back if time/schedule allow, otherwise will attempt to see on next day of service.    Deniece Ree PT, DPT, CBIS  Supplemental Physical Therapist [facility]    Pager [phone] Acute Rehab Office [phone]

## 2018-09-14 NOTE — ED Notes (Signed)
ED TO INPATIENT HANDOFF REPORT  ED Nurse Name and Phone #: Tray Martinique, [phone]  S Name/Age/Gender Tina Waller 78 y.o. female Room/Bed: 017C/017C  Code Status   Code Status: Full Code  Home/SNF/Other Home Patient oriented to: self, place and situation Is this baseline? No  Triage Complete: Triage complete  Chief Complaint High BP  Triage Note Pt arrived EMS from home with dizziness over that past week. Pt has had issues with her BP since November and has been working with her Dr to control her BP. Pt states she almost had a syncopal episode in her kitchen tonight. No LOC. Per EMS pt was slightly off balance with her gait.    Allergies Allergies  Allergen Reactions  . Lipitor [Atorvastatin] Diarrhea  . Lisinopril Cough    Level of Care/Admitting Diagnosis ED Disposition    ED Disposition Condition [facility] Area: [place] [[id number]]  Level of Care: Medical Telemetry [104]  Diagnosis: Acute CVA (cerebrovascular accident) [facility]) [[id number]]  Admitting Physician: Reubin Milan [[id number]]  Attending Physician: Reubin Milan [[id number]]  Estimated length of stay: past midnight tomorrow  Certification:: I certify this patient will need inpatient services for at least 2 midnights  PT Class (Do Not Modify): Inpatient [101]  PT Acc Code (Do Not Modify): Private [1]       B Medical/Surgery History Past Medical History:  Diagnosis Date  . Anemia   . Anxiety   . Cataract   . Depression   . Diabetes mellitus without complication ([place])    pt denies  . Gout   . Hypertension   . Stroke [facility]) 2008  . Type 2 diabetes mellitus ([place]) 09/14/2018   Past Surgical History:  Procedure Laterality Date  . ABDOMINAL HYSTERECTOMY    . CATARACT EXTRACTION Left      A IV Location/Drains/Wounds Patient Lines/Drains/Airways Status   Active Line/Drains/Airways    Name:   Placement date:   Placement time:   Site:   Days:    Peripheral IV 09/14/18 Right Hand   09/14/18    0243    Hand   less than 1   Peripheral IV 09/14/18 Right Antecubital   09/14/18    0558    Antecubital   less than 1          Intake/Output Last 24 hours No intake or output data in the 24 hours ending 09/14/18 4270  Labs/Imaging Results for orders placed or performed during the hospital encounter of 09/13/18 (from the past 48 hour(s))  Urinalysis, Routine w reflex microscopic     Status: Abnormal   Collection Time: 09/13/18  7:44 PM  Result Value Ref Range   Color, Urine YELLOW YELLOW   APPearance HAZY (A) CLEAR   Specific Gravity, Urine 1.009 1.005 - 1.030   pH 7.0 5.0 - 8.0   Glucose, UA NEGATIVE NEGATIVE mg/dL   Hgb urine dipstick MODERATE (A) NEGATIVE   Bilirubin Urine NEGATIVE NEGATIVE   Ketones, ur NEGATIVE NEGATIVE mg/dL   Protein, ur 100 (A) NEGATIVE mg/dL   Nitrite NEGATIVE NEGATIVE   Leukocytes,Ua NEGATIVE NEGATIVE   RBC / HPF 11-20 0 - 5 RBC/hpf   WBC, UA 0-5 0 - 5 WBC/hpf   Bacteria, UA RARE (A) NONE SEEN   Squamous Epithelial / LPF 6-10 0 - 5    Comment: Performed at [facility], [address], [place], [place] [address]  Basic metabolic panel     Status:  Abnormal   Collection Time: 09/13/18  7:46 PM  Result Value Ref Range   Sodium 137 135 - 145 mmol/L   Potassium 3.7 3.5 - 5.1 mmol/L   Chloride 104 98 - 111 mmol/L   CO2 24 22 - 32 mmol/L   Glucose, Bld 125 (H) 70 - 99 mg/dL   BUN 18 8 - 23 mg/dL   Creatinine, Ser 1.09 (H) 0.44 - 1.00 mg/dL   Calcium 9.4 8.9 - 10.3 mg/dL   GFR calc non Af Amer 50 (L) >60 mL/min   GFR calc Af Amer 58 (L) >60 mL/min   Anion gap 9 5 - 15    Comment: Performed at [facility] [address] [place], [place] [address]  CBC     Status: None   Collection Time: 09/13/18  7:46 PM  Result Value Ref Range   WBC 7.1 4.0 - 10.5 K/uL   RBC 4.00 3.87 - 5.11 MIL/uL   Hemoglobin 12.8 12.0 - 15.0 g/dL   HCT 38.3 36.0 - 46.0 %   MCV 95.8 80.0 - 100.0 fL   MCH 32.0 26.0  - 34.0 pg   MCHC 33.4 30.0 - 36.0 g/dL   RDW 12.0 11.5 - 15.5 %   Platelets 275 150 - 400 K/uL   nRBC 0.0 0.0 - 0.2 %    Comment: Performed at [facility], [place] [address], [place], [place] [address]  Magnesium     Status: None   Collection Time: 09/13/18  7:46 PM  Result Value Ref Range   Magnesium 1.8 1.7 - 2.4 mg/dL    Comment: Performed at [facility] [address], [place], [place] [address]  Phosphorus     Status: None   Collection Time: 09/13/18  7:46 PM  Result Value Ref Range   Phosphorus 3.0 2.5 - 4.6 mg/dL    Comment: Performed at [facility] [address] [place], [place] [address]  I-Stat Troponin, ED (not at [facility])     Status: None   Collection Time: 09/14/18  1:02 AM  Result Value Ref Range   Troponin i, poc 0.01 0.00 - 0.08 ng/mL   Comment 3            Comment: Due to the release kinetics of cTnI, a negative result within the first hours of the onset of symptoms does not rule out myocardial infarction with certainty. If myocardial infarction is still suspected, repeat the test at appropriate intervals.   Lipid panel     Status: Abnormal   Collection Time: 09/14/18  5:11 AM  Result Value Ref Range   Cholesterol 215 (H) 0 - 200 mg/dL   Triglycerides 76 <150 mg/dL   HDL 41 >40 mg/dL   Total CHOL/HDL Ratio 5.2 RATIO   VLDL 15 0 - 40 mg/dL   LDL Cholesterol 159 (H) 0 - 99 mg/dL    Comment:        Total Cholesterol/HDL:CHD Risk Coronary Heart Disease Risk Table                     Men   Women  1/2 Average Risk   3.4   3.3  Average Risk       5.0   4.4  2 X Average Risk   9.6   7.1  3 X Average Risk  23.4   11.0        Use the calculated Patient Ratio above and  the CHD Risk Table to determine the patient's CHD Risk.        ATP III CLASSIFICATION (LDL):  <100     mg/dL   Optimal  100-129  mg/dL   Near or Above                    Optimal  130-159  mg/dL   Borderline  160-189  mg/dL   High  >190     mg/dL   Very High Performed at  [facility] [address] [place], [place] [address]    Ct Angio Head W Or Wo Contrast  Result Date: 09/14/2018 CLINICAL DATA:  78 year old female with dizziness for 1 week and small acute infarct in the right cerebellum on MRI earlier today. EXAM: CT ANGIOGRAPHY HEAD AND NECK TECHNIQUE: Multidetector CT imaging of the head and neck was performed using the standard protocol during bolus administration of intravenous contrast. Multiplanar CT image reconstructions and MIPs were obtained to evaluate the vascular anatomy. Carotid stenosis measurements (when applicable) are obtained utilizing NASCET criteria, using the distal internal carotid diameter as the denominator. CONTRAST:  36mL ISOVUE-370 IOPAMIDOL (ISOVUE-370) INJECTION 76% COMPARISON:  Head CT and MRI earlier today. FINDINGS: CTA NECK Skeleton: Carious mandible dentition and absent maxillary dentition. Mild for age cervical spine degeneration. No acute osseous abnormality identified. Upper chest: Severe emphysema. No superior mediastinal lymphadenopathy. Other neck: Asymmetrically rounded and hyperenhancing right lateral pharyngeal wall/tonsillar pillar on series 7, image 98 and series 10, image 107 encompassing up to 22 millimeters. Superimposed postinflammatory appearing dystrophic calcifications. No pathologically enlarged lymph nodes, although a right level 2 lymph node is mildly heterogeneous on series 11, image 65. Other neck soft tissues appear grossly normal. Aortic arch: Calcified and soft aortic atherosclerosis. 3 vessel arch configuration. Right carotid system: No brachiocephalic artery or right CCA origin stenosis despite plaque. Mildly tortuous right CCA. Mild soft and calcified plaque at the bifurcation and right ICA bulb. Mild to moderate calcified plaque in the medial vessel just below the skull base, but no significant stenosis. Left carotid system: No left CCA origin stenosis despite soft and calcified plaque. Mildly tortuous  vessel with continued atherosclerosis to the bifurcation, no significant stenosis. Plaque at the bifurcation and continuing into the left ICA bulb but no significant stenosis. Tortuous left ICA at the C1 level. Vertebral arteries: No proximal right subclavian artery stenosis. Calcified right vertebral artery origin and V1 segment with severe tandem stenoses. Minimal calcified right V2 plaque without stenosis. Intermittent right V3 calcified plaque without significant stenosis. The vessel remains patent to the skull base. No proximal left subclavian artery stenosis despite soft and calcified plaque. Normal left vertebral artery origin. Mild V1 segment plaque without stenosis. Intermittent V2 segment plaque without significant stenosis. Moderate left V3 segment plaque with up to 50% stenosis. The vessel remains patent to the skull base. CTA HEAD Posterior circulation: There is intermittent right V4 calcified plaque without hemodynamically significant stenosis. The right PICA origin is patent. The right vertebrobasilar junction is patent. There is more bulky left V4 segment calcified plaque with moderate to severe left V4 segment stenosis on series 9, image 160. But the vessel remains patent to the vertebrobasilar junction. The left AICA appears dominant and is patent. The basilar artery is patent with mild irregularity, no stenosis. Normal SCA and PCA origins. Posterior communicating arteries are diminutive or absent. Mild left and moderate right PCA P2 segment irregularity and stenosis. And there is severe stenosis of the  right inferior P3 division on series 14, image 20 but preserved distal enhancement. Left PCA branches are within normal limits. Anterior circulation: Both ICA siphons are patent. The left siphon is heavily calcified with severe cavernous segment stenosis (series 9, image 135). There is mild left supraclinoid stenosis. Normal left ophthalmic artery origin. The right siphon is also heavily calcified  with severe right cavernous segment stenosis (series 11, image 95). There is mild right supraclinoid stenosis. Normal right ophthalmic artery origin. Patent carotid termini. Normal MCA and ACA origins. The left A1 is dominant and mildly irregular. The anterior communicating artery appears partially fenestrated, normal variant. ACA A2 segments are within normal limits. There is some moderate irregularity and stenosis in the left A3 branches on series 14, image 22. Left MCA M1 is patent without stenosis. The left MCA bifurcation is patent with mild to moderate stenosis affecting the proximal dominant posterior M2 branch seen on series 12, image 25. Mild to moderate left MCA branch irregularity otherwise. Right MCA M1 and trifurcation are patent without stenosis. There are moderate right MCA M3 branch irregularities and stenosis as seen on series 14, image 15. Venous sinuses: Patent on delayed images. Anatomic variants: Mildly dominant left A1. Delayed phase: No abnormal enhancement identified. Chronic small and medium-sized vessel ischemic changes redemonstrated. The small medial right PICA territory infarct seen by DWI is not evident. Review of the MIP images confirms the above findings IMPRESSION: 1. Appearance suspicious for a 2.2 cm Right Tonsil/Pharyngeal Mass. Recommend ENT follow-up for direct visualization. No pathologically enlarged nodes, but there is a heterogeneous right level 2 lymph node seen on series 11, image 65. 2. Negative for large vessel occlusion, but positive for widespread atherosclerosis with significant stenoses: - Severe stenoses Right vertebral artery origin and V1 segment. - Severe stenosis Left vertebral V4 segment. - Severe stenosis of the bilateral ICA siphon cavernous segments. - up to Moderate stenosis Right PCA P2 segment, Left MCA M2, and bilateral MCA M3 branches. - Moderate to Severe stenosis of the Right PCA inferior P3, Left ACA A3. 3. Stable CT appearance of the brain from  earlier today. 4. Aortic Atherosclerosis (ICD10-I70.0) and Emphysema (ICD10-J43.9). Electronically Signed   By: Genevie Ann M.D.   On: 09/14/2018 06:54   Ct Head Wo Contrast  Result Date: 09/14/2018 CLINICAL DATA:  Vertigo, episodic, peripheral. Dizziness for 1 week. EXAM: CT HEAD WITHOUT CONTRAST TECHNIQUE: Contiguous axial images were obtained from the base of the skull through the vertex without intravenous contrast. COMPARISON:  Head CT and brain MRI 02/01/2007 FINDINGS: Brain: No acute hemorrhage. Multiple remote lacunar infarcts in the pons, right cerebellum, and right basal ganglia. Remote infarct in the left occipital lobe. Generalized atrophy with microvascular ischemia. No evidence of acute ischemia. No hydrocephalus, mass effect, or midline shift. Vascular: Mild generalized increased density of intracranial vasculature without hyperdense vessel. There is skull base atherosclerosis. Skull: No fracture or focal lesion. Sinuses/Orbits: Trace chronic debris in right side of sphenoid sinus. No sinus fluid level. The mastoid air cells are clear. The visualized orbits are unremarkable. Prior right cataract resection Other: None. IMPRESSION: 1. No acute intracranial abnormality. 2. Generalized atrophy and chronic ischemia. Multiple remote lacunar infarcts. Remote infarct in the left occipital lobe. Electronically Signed   By: Keith Rake M.D.   On: 09/14/2018 01:12   Ct Angio Neck W Or Wo Contrast  Result Date: 09/14/2018 CLINICAL DATA:  78 year old female with dizziness for 1 week and small acute infarct in the right cerebellum on  MRI earlier today. EXAM: CT ANGIOGRAPHY HEAD AND NECK TECHNIQUE: Multidetector CT imaging of the head and neck was performed using the standard protocol during bolus administration of intravenous contrast. Multiplanar CT image reconstructions and MIPs were obtained to evaluate the vascular anatomy. Carotid stenosis measurements (when applicable) are obtained utilizing NASCET  criteria, using the distal internal carotid diameter as the denominator. CONTRAST:  63mL ISOVUE-370 IOPAMIDOL (ISOVUE-370) INJECTION 76% COMPARISON:  Head CT and MRI earlier today. FINDINGS: CTA NECK Skeleton: Carious mandible dentition and absent maxillary dentition. Mild for age cervical spine degeneration. No acute osseous abnormality identified. Upper chest: Severe emphysema. No superior mediastinal lymphadenopathy. Other neck: Asymmetrically rounded and hyperenhancing right lateral pharyngeal wall/tonsillar pillar on series 7, image 98 and series 10, image 107 encompassing up to 22 millimeters. Superimposed postinflammatory appearing dystrophic calcifications. No pathologically enlarged lymph nodes, although a right level 2 lymph node is mildly heterogeneous on series 11, image 65. Other neck soft tissues appear grossly normal. Aortic arch: Calcified and soft aortic atherosclerosis. 3 vessel arch configuration. Right carotid system: No brachiocephalic artery or right CCA origin stenosis despite plaque. Mildly tortuous right CCA. Mild soft and calcified plaque at the bifurcation and right ICA bulb. Mild to moderate calcified plaque in the medial vessel just below the skull base, but no significant stenosis. Left carotid system: No left CCA origin stenosis despite soft and calcified plaque. Mildly tortuous vessel with continued atherosclerosis to the bifurcation, no significant stenosis. Plaque at the bifurcation and continuing into the left ICA bulb but no significant stenosis. Tortuous left ICA at the C1 level. Vertebral arteries: No proximal right subclavian artery stenosis. Calcified right vertebral artery origin and V1 segment with severe tandem stenoses. Minimal calcified right V2 plaque without stenosis. Intermittent right V3 calcified plaque without significant stenosis. The vessel remains patent to the skull base. No proximal left subclavian artery stenosis despite soft and calcified plaque. Normal left  vertebral artery origin. Mild V1 segment plaque without stenosis. Intermittent V2 segment plaque without significant stenosis. Moderate left V3 segment plaque with up to 50% stenosis. The vessel remains patent to the skull base. CTA HEAD Posterior circulation: There is intermittent right V4 calcified plaque without hemodynamically significant stenosis. The right PICA origin is patent. The right vertebrobasilar junction is patent. There is more bulky left V4 segment calcified plaque with moderate to severe left V4 segment stenosis on series 9, image 160. But the vessel remains patent to the vertebrobasilar junction. The left AICA appears dominant and is patent. The basilar artery is patent with mild irregularity, no stenosis. Normal SCA and PCA origins. Posterior communicating arteries are diminutive or absent. Mild left and moderate right PCA P2 segment irregularity and stenosis. And there is severe stenosis of the right inferior P3 division on series 14, image 20 but preserved distal enhancement. Left PCA branches are within normal limits. Anterior circulation: Both ICA siphons are patent. The left siphon is heavily calcified with severe cavernous segment stenosis (series 9, image 135). There is mild left supraclinoid stenosis. Normal left ophthalmic artery origin. The right siphon is also heavily calcified with severe right cavernous segment stenosis (series 11, image 95). There is mild right supraclinoid stenosis. Normal right ophthalmic artery origin. Patent carotid termini. Normal MCA and ACA origins. The left A1 is dominant and mildly irregular. The anterior communicating artery appears partially fenestrated, normal variant. ACA A2 segments are within normal limits. There is some moderate irregularity and stenosis in the left A3 branches on series 14, image 22. Left  MCA M1 is patent without stenosis. The left MCA bifurcation is patent with mild to moderate stenosis affecting the proximal dominant posterior M2  branch seen on series 12, image 25. Mild to moderate left MCA branch irregularity otherwise. Right MCA M1 and trifurcation are patent without stenosis. There are moderate right MCA M3 branch irregularities and stenosis as seen on series 14, image 15. Venous sinuses: Patent on delayed images. Anatomic variants: Mildly dominant left A1. Delayed phase: No abnormal enhancement identified. Chronic small and medium-sized vessel ischemic changes redemonstrated. The small medial right PICA territory infarct seen by DWI is not evident. Review of the MIP images confirms the above findings IMPRESSION: 1. Appearance suspicious for a 2.2 cm Right Tonsil/Pharyngeal Mass. Recommend ENT follow-up for direct visualization. No pathologically enlarged nodes, but there is a heterogeneous right level 2 lymph node seen on series 11, image 65. 2. Negative for large vessel occlusion, but positive for widespread atherosclerosis with significant stenoses: - Severe stenoses Right vertebral artery origin and V1 segment. - Severe stenosis Left vertebral V4 segment. - Severe stenosis of the bilateral ICA siphon cavernous segments. - up to Moderate stenosis Right PCA P2 segment, Left MCA M2, and bilateral MCA M3 branches. - Moderate to Severe stenosis of the Right PCA inferior P3, Left ACA A3. 3. Stable CT appearance of the brain from earlier today. 4. Aortic Atherosclerosis (ICD10-I70.0) and Emphysema (ICD10-J43.9). Electronically Signed   By: Genevie Ann M.D.   On: 09/14/2018 06:54   Mr Brain Wo Contrast (neuro Protocol)  Result Date: 09/14/2018 CLINICAL DATA:  Vertigo, episodic, peripheral EXAM: MRI HEAD WITHOUT CONTRAST TECHNIQUE: Multiplanar, multiecho pulse sequences of the brain and surrounding structures were obtained without intravenous contrast. COMPARISON:  02/02/2007 FINDINGS: Brain: Subcentimeter acute right cerebellar infarct. Remote lacunar infarcts in the bilateral pons and left thalamus-the latter new from 2008. Remote perforator  infarct in the right lateral lenticulostriate distribution and in the right cerebellum. Small remote left occipital cortex infarct that is new from 2008. No acute hemorrhage, hydrocephalus, collection, or masslike finding. Mild atrophy. Vascular: Major flow voids are preserved Skull and upper cervical spine: Negative for marrow lesion Sinuses/Orbits: Right cataract resection IMPRESSION: 1. Small acute infarct in the right cerebellum. 2. Remote small vessel infarcts as described, progressed from 2008. Electronically Signed   By: Monte Fantasia M.D.   On: 09/14/2018 04:04    Pending Labs Unresulted Labs (From admission, onward)    Start     Ordered   09/21/18 0500  Creatinine, serum  (enoxaparin (LOVENOX)    CrCl >/= 30 ml/min)  Weekly,   R    Comments:  while on enoxaparin therapy    09/14/18 0234   09/21/18 0500  Creatinine, serum  (enoxaparin (LOVENOX)    CrCl >/= 30 ml/min)  Weekly,   R    Comments:  while on enoxaparin therapy    09/14/18 0345   09/14/18 0700  Troponin I - Now Then Q6H  Now then every 6 hours,   R     09/14/18 0231   09/14/18 0500  Hemoglobin A1c  Tomorrow morning,   R     09/14/18 0345          Vitals/Pain Today's Vitals   09/14/18 0130 09/14/18 0145 09/14/18 0200 09/14/18 0530  BP: (!) 188/79 (!) 167/96 (!) 187/82 (!) 157/77  Pulse: 66 67 65 75  Resp: (!) 21 (!) 21 20 17   Temp:      TempSrc:      SpO2:  97% 100% 100% 97%  PainSc:        Isolation Precautions No active isolations  Medications Medications  sodium chloride flush (NS) 0.9 % injection 3 mL (has no administration in time range)  enoxaparin (LOVENOX) injection 40 mg (has no administration in time range)  ondansetron (ZOFRAN) tablet 4 mg (has no administration in time range)    Or  ondansetron (ZOFRAN) injection 4 mg (has no administration in time range)   stroke: mapping our early stages of recovery book (has no administration in time range)  acetaminophen (TYLENOL) tablet 650 mg (has no  administration in time range)    Or  acetaminophen (TYLENOL) solution 650 mg (has no administration in time range)    Or  acetaminophen (TYLENOL) suppository 650 mg (has no administration in time range)  metoprolol tartrate (LOPRESSOR) injection 5 mg (has no administration in time range)  clopidogrel (PLAVIX) tablet 300 mg (has no administration in time range)  clopidogrel (PLAVIX) tablet 75 mg (has no administration in time range)  aspirin EC tablet 81 mg (has no administration in time range)  iopamidol (ISOVUE-370) 76 % injection (has no administration in time range)  LORazepam (ATIVAN) injection 0.5 mg (0.5 mg Intravenous Given 09/14/18 0239)  iopamidol (ISOVUE-370) 76 % injection 75 mL (75 mLs Intravenous Contrast Given 09/14/18 0616)    Mobility walks with device     Focused Assessments Neuro Assessment Handoff:  Swallow screen pass? Yes    NIH Stroke Scale ( + Modified Stroke Scale Criteria)  Interval: Initial Level of Consciousness (1a.)   : Alert, keenly responsive LOC Questions (1b. )   +: Answers one question correctly LOC Commands (1c. )   + : Performs both tasks correctly Best Gaze (2. )  +: Normal Visual (3. )  +: Partial hemianopia Facial Palsy (4. )    : Minor paralysis Motor Arm, Left (5a. )   +: No drift Motor Arm, Right (5b. )   +: No drift Motor Leg, Left (6a. )   +: No drift Motor Leg, Right (6b. )   +: No drift Limb Ataxia (7. ): Absent Sensory (8. )   +: Normal, no sensory loss Best Language (9. )   +: No aphasia Dysarthria (10. ): Normal Extinction/Inattention (11.)   +: No Abnormality Modified SS Total  +: 2 Complete NIHSS TOTAL: 3     Neuro Assessment: Exceptions to WDL Neuro Checks:   Initial (09/14/18 0016)  Last Documented NIHSS Modified Score: 2 (09/14/18 0600) Has TPA been given? No If patient is a Neuro Trauma and patient is going to OR before floor call report to [facility] nurse: [phone] or [phone]     R Recommendations: See  Admitting Provider Note  Report given to:   Additional Notes: N/A

## 2018-09-14 NOTE — Consult Note (Signed)
Neurology Consultation Reason for Consult: Stroke Referring Physician: Olevia Bowens, D  CC: Dizziness  History is obtained from: Patient  HPI: Tina Waller is a 78 y.o. female who has been having dizziness since last Sunday.  She states that it started rather abruptly, and is been persistent since that time.  When I asked her if it is been getting better, worse or staying the same, she states that it is staying the same.  She denies any trouble walking.  She describes the dizziness as room spinning type sensation.  She has had some nausea but no vomiting.   LKW: Thursday tpa given?: no, outside of window    ROS: A 14 point ROS was performed and is negative except as noted in the HPI.   Past Medical History:  Diagnosis Date  . Anemia   . Anxiety   . Cataract   . Depression   . Diabetes mellitus without complication ([place])    pt denies  . Gout   . Hypertension   . Stroke [facility] 2008  . Type 2 diabetes mellitus ([place]) 09/14/2018     Family History  Problem Relation Age of Onset  . Heart disease Mother   . Osteoporosis Neg Hx      Social History:  reports that she quit smoking about 12 years ago. She has never used smokeless tobacco. She reports current alcohol use of about 7.0 standard drinks of alcohol per week. She reports that she does not use drugs.   Exam: Current vital signs: BP (!) 187/82   Pulse 65   Temp 98.2 F (36.8 C) (Oral)   Resp 20   SpO2 100%  Vital signs in last 24 hours: Temp:  [97.5 F (36.4 C)-98.2 F (36.8 C)] 98.2 F (36.8 C) (03/02 2323) Pulse Rate:  [62-73] 65 (03/03 0200) Resp:  [14-24] 20 (03/03 0200) BP: (106-203)/(65-171) 187/82 (03/03 0200) SpO2:  [96 %-100 %] 100 % (03/03 0200)   Physical Exam  Constitutional: Appears well-developed and well-nourished.  Psych: Affect appropriate to situation Eyes: No scleral injection HENT: No OP obstrucion Head: Normocephalic.  Cardiovascular: Normal rate and regular rhythm.  Respiratory:  Effort normal, non-labored breathing GI: Soft.  No distension. There is no tenderness.  Skin: WDI  Neuro: Mental Status: Patient is awake, alert, oriented to person, place, month, year, and situation. Patient is able to give a clear and coherent history. No signs of aphasia or neglect Cranial Nerves: II: Visual Fields are full. Pupils are equal, round, and reactive to light.   III,IV, VI: EOMI without ptosis or diploplia.  V: Facial sensation is symmetric to temperature VII: Facial movement is symmetric.  VIII: hearing is intact to voice X: Uvula elevates symmetrically XI: Shoulder shrug is symmetric. XII: tongue is midline without atrophy or fasciculations.  Motor: Tone is normal. Bulk is normal. 5/5 strength was present in all four extremities.  Sensory: Sensation is symmetric to light touch and temperature in the arms and legs. Deep Tendon Reflexes: 2+ and symmetric in the biceps and patellae.  Plantars: Toes are downgoing bilaterally.  Cerebellar: She has intentional tremor bilaterally, however she appears to have slightly more discoordination in the right hand than the left.   I have reviewed labs in epic and the results pertinent to this consultation are: BMP- borderline glucose  I have reviewed the images obtained: MRI brain-punctate cerebellar infarct in the setting of old infarcts in the same region.  Impression: 78 year old female with small cerebellar infarct.  I think that  given other old strokes in the distribution, artery to artery embolus is a consideration and she will need vascular imaging.  She will be admitted for physical therapy and further work-up.  Recommendations: - HgbA1c, fasting lipid panel -CT angiogram head neck - Frequent neuro checks - Echocardiogram - Prophylactic therapy-Antiplatelet med: Aspirin 81 mg daily, Plavix 75 mg daily after 300 mg load for 3 weeks. - Risk factor modification - Telemetry monitoring - PT consult, OT consult, Speech  consult - Stroke team to follow    Roland Rack, MD [facility] [phone]  If 7pm- 7am, please page neurology on call as listed in [place].

## 2018-09-14 NOTE — H&P (Signed)
History and Physical    Tina Waller DOB: [birth year] DOA: 09/13/2018  PCP: [name], MD   Patient coming from: Home.  I have personally briefly reviewed patient's old medical records in [place]  Chief Complaint: Dizziness and abnormal blood pressure.  HPI: Tina Waller is a 78 y.o. female with medical history significant of anemia, anxiety, depression, cataracts, type 2 diabetes, gout, hypertension, history of CVA who is coming to the emergency department with complaints of on and off postural dizziness for the past week.  She states that back in December she was unable to take 1 of her blood pressure medications for about 2 weeks.  Since then, she has experienced significant trouble controlling her blood pressure.  Her blood pressure also fluctuates very widely.  She thinks that she was taken off the losartan 100 mg p.o. daily.  About 6 months ago, she was taken off amlodipine 5 mg p.o. daily, but recently her PCP restarted this medication.  She denies fever, chills, rhinorrhea, sore throat, wheezing or hemoptysis.  No chest pain, palpitations, diaphoresis, PND, orthopnea, but states he occasionally gets lower extremities edema.  Denies abdominal pain, nausea, emesis, diarrhea, constipation, melena or hematochezia.  No dysuria, frequency or hematuria.  Denies polyuria, polydipsia, polyphagia or blurred vision.  Denies skin rashes or pruritus.  ED Course: Initial vital signs temperature 97.5 F, pulse 66, respirations 22, blood pressure 191/75 mmHg and O2 sat 98% on room air.  Her blood pressure has fluctuated significantly while in the emergency department.  She mentions that this happens home as well.  She did not receive any medications in the emergency department.  Her urinalysis showed moderate hemoglobinuria, 100 mg/dL of proteinuria and rare bacteria on microscopic examination.  CBC shows a white count of 7.1, hemoglobin 12.8 g/dL and platelets 275.   BMP shows a glucose of 125 and creatinine mildly elevated at 1.09 mg/dL.  All other values are within normal limits.  Troponin was 0.01 ng/mL.  EKG shows repolarization abnormality with anterolateral leads showing T wave inversion, which is new.  Review of Systems: As per HPI otherwise 10 point review of systems negative.   Past Medical History:  Diagnosis Date  . Anemia   . Anxiety   . Cataract   . Depression   . Diabetes mellitus without complication ([place])    pt denies  . Gout   . Hypertension   . Stroke [facility]) 2008  . Type 2 diabetes mellitus ([place]) 09/14/2018    Past Surgical History:  Procedure Laterality Date  . ABDOMINAL HYSTERECTOMY    . CATARACT EXTRACTION Left      reports that she quit smoking about 12 years ago. She has never used smokeless tobacco. She reports current alcohol use of about 7.0 standard drinks of alcohol per week. She reports that she does not use drugs.  Allergies  Allergen Reactions  . Lipitor [Atorvastatin] Diarrhea  . Lisinopril Cough    Family History  Problem Relation Age of Onset  . Heart disease Mother   . Osteoporosis Neg Hx    Prior to Admission medications   Medication Sig Start Date End Date Taking? Authorizing Provider  amLODipine (NORVASC) 5 MG tablet Take 1 tablet (5 mg total) by mouth daily. 09/13/18   [name], MD  aspirin EC 81 MG tablet Take 81 mg by mouth daily.    [provider]  Cyanocobalamin (VITAMIN B 12 PO) Take 1 tablet by mouth daily.  [provider]  furosemide (LASIX) 40 MG tablet Take 1 tablet (40 mg total) by mouth daily. 07/27/18   [name], MD  ibandronate (BONIVA) 150 MG tablet Take 1 tablet (150 mg total) by mouth every 30 (thirty) days. Take with a full glass of water, on an empty stomach, and stay upright for 30 min. Patient not taking: Reported on 07/13/2018 04/17/17   [name], MD  losartan (COZAAR) 100 MG tablet Take 1 tablet (100 mg total) by mouth daily.  05/27/18   [name], MD  metoprolol succinate (TOPROL-XL) 25 MG 24 hr tablet Take 1 tab PO daily with a 50 mg tablet for a total of 75 mg daily 08/06/18   [name], MD  metoprolol succinate (TOPROL-XL) 50 MG 24 hr tablet Take 1 tablet (50 mg total) by mouth daily. Take with or immediately following a meal. 07/27/18   [name], MD  polyethylene glycol [facility]) packet Take 17 g by mouth daily as needed. 10/21/17   Jaynee Eagles, PA-C  potassium chloride (KLOR-CON) 8 MEQ tablet Take 2 tablets (16 mEq total) by mouth daily. 07/21/18   [name], MD  triamcinolone cream (KENALOG) 0.1 % Apply 1 application topically 2 (two) times daily. 01/12/17   [name], MD  VITAMIN D PO Take 1 tablet by mouth daily.    [provider]    Physical Exam: Vitals:   09/14/18 0115 09/14/18 0130 09/14/18 0145 09/14/18 0200  BP: (!) 167/91 (!) 188/79 (!) 167/96 (!) 187/82  Pulse: 73 66 67 65  Resp: 16 (!) 21 (!) 21 20  Temp:      TempSrc:      SpO2: 96% 97% 100% 100%    Constitutional: NAD, calm, comfortable Eyes: PERRL, lids and conjunctivae normal ENMT: Mucous membranes are moist. Posterior pharynx clear of any exudate or lesions.  Poor state of repair dentition. Neck: normal, supple, no masses, no thyromegaly Respiratory: clear to auscultation bilaterally, no wheezing, no crackles. Normal respiratory effort. No accessory muscle use.  Cardiovascular: Regular rate and rhythm, no murmurs / rubs / gallops.  Trace lower extremities pitting edema. 2+ pedal pulses. No carotid bruits.  Abdomen: Soft, no tenderness, no masses palpated. No hepatosplenomegaly. Bowel sounds positive.  Musculoskeletal: no clubbing / cyanosis. No joint deformity upper and lower extremities. Good ROM, no contractures. Normal muscle tone.  Skin: no rashes, lesions, ulcers on very limited dermatological examination. Neurologic: CN 2-12 grossly intact. Sensation intact, DTR normal. Strength 5/5  in all 4.  Psychiatric: Normal judgment and insight. Alert and oriented x 3. Normal mood.   Labs on Admission: I have personally reviewed following labs and imaging studies  CBC: Recent Labs  Lab 09/13/18 1946  WBC 7.1  HGB 12.8  HCT 38.3  MCV 95.8  PLT 751   Basic Metabolic Panel: Recent Labs  Lab 09/13/18 1946  NA 137  K 3.7  CL 104  CO2 24  GLUCOSE 125*  BUN 18  CREATININE 1.09*  CALCIUM 9.4   GFR: Estimated Creatinine Clearance: 44 mL/min (A) (by C-G formula based on SCr of 1.09 mg/dL (H)). Liver Function Tests: No results for input(s): AST, ALT, ALKPHOS, BILITOT, PROT, ALBUMIN in the last 168 hours. No results for input(s): LIPASE, AMYLASE in the last 168 hours. No results for input(s): AMMONIA in the last 168 hours. Coagulation Profile: No results for input(s): INR, PROTIME in the last 168 hours. Cardiac Enzymes: No results for input(s): CKTOTAL, CKMB,  CKMBINDEX, TROPONINI in the last 168 hours. BNP (last 3 results) No results for input(s): PROBNP in the last 8760 hours. HbA1C: No results for input(s): HGBA1C in the last 72 hours. CBG: No results for input(s): GLUCAP in the last 168 hours. Lipid Profile: No results for input(s): CHOL, HDL, LDLCALC, TRIG, CHOLHDL, LDLDIRECT in the last 72 hours. Thyroid Function Tests: No results for input(s): TSH, T4TOTAL, FREET4, T3FREE, THYROIDAB in the last 72 hours. Anemia Panel: No results for input(s): VITAMINB12, FOLATE, FERRITIN, TIBC, IRON, RETICCTPCT in the last 72 hours. Urine analysis:    Component Value Date/Time   COLORURINE YELLOW 09/13/2018 1944   APPEARANCEUR HAZY (A) 09/13/2018 1944   APPEARANCEUR Cloudy (A) 11/13/2016 1425   LABSPEC 1.009 09/13/2018 1944   PHURINE 7.0 09/13/2018 1944   GLUCOSEU NEGATIVE 09/13/2018 1944   HGBUR MODERATE (A) 09/13/2018 1944   BILIRUBINUR NEGATIVE 09/13/2018 1944   BILIRUBINUR Negative 11/13/2016 [place] 09/13/2018 1944   PROTEINUR 100 (A)  09/13/2018 1944   UROBILINOGEN 0.2 02/01/2007 2102   NITRITE NEGATIVE 09/13/2018 1944   LEUKOCYTESUR NEGATIVE 09/13/2018 1944    Radiological Exams on Admission: Ct Head Wo Contrast  Result Date: 09/14/2018 CLINICAL DATA:  Vertigo, episodic, peripheral. Dizziness for 1 week. EXAM: CT HEAD WITHOUT CONTRAST TECHNIQUE: Contiguous axial images were obtained from the base of the skull through the vertex without intravenous contrast. COMPARISON:  Head CT and brain MRI 02/01/2007 FINDINGS: Brain: No acute hemorrhage. Multiple remote lacunar infarcts in the pons, right cerebellum, and right basal ganglia. Remote infarct in the left occipital lobe. Generalized atrophy with microvascular ischemia. No evidence of acute ischemia. No hydrocephalus, mass effect, or midline shift. Vascular: Mild generalized increased density of intracranial vasculature without hyperdense vessel. There is skull base atherosclerosis. Skull: No fracture or focal lesion. Sinuses/Orbits: Trace chronic debris in right side of sphenoid sinus. No sinus fluid level. The mastoid air cells are clear. The visualized orbits are unremarkable. Prior right cataract resection Other: None. IMPRESSION: 1. No acute intracranial abnormality. 2. Generalized atrophy and chronic ischemia. Multiple remote lacunar infarcts. Remote infarct in the left occipital lobe. Electronically Signed   By: Keith Rake M.D.   On: 09/14/2018 01:12    EKG: Independently reviewed. Vent. rate 65 BPM PR interval * ms QRS duration 124 ms QT/QTc 463/482 ms P-R-T axes 74 -54 -50 Sinus rhythm Nonspecific IVCD with LAD LVH with secondary repolarization abnormality  Assessment/Plan Principal Problem:   Acute CVA (cerebrovascular accident) ([place]) Admit to telemetry/inpatient. Frequent neuro checks. PT/OT/SLP. Check hemoglobin A1c. Check fasting lipids. Check carotid Doppler. Check echocardiogram. Neurology is following.  Active Problems:   Hypertensive  urgency Will need to allow permissive hypertension. Monitor BP closely.    CKD (chronic kidney disease) stage 3, GFR 30-59 ml/min (HCC) Monitor renal function and electrolytes.    Type 2 diabetes mellitus (HCC) Carbohydrate modified diet. CBG monitoring with regular insulin sliding scale while in the hospital. Check hemoglobin A1c.    Anxiety Anxiolytics as needed.    DVT prophylaxis: Lovenox SQ. Code Status: Full code. Family Communication: Disposition Plan: Observation for BP monitoring/control and further work-up. Consults called: Per EDP, Dr. Leonel Ramsay (neurologist) will be evaluating the patient Admission status: Observation/telemetry.   Reubin Milan MD [facility]  09/14/2018, 2:38 AM

## 2018-09-14 NOTE — ED Provider Notes (Signed)
[place] EMERGENCY DEPARTMENT Provider Note   CSN: [id number] Arrival date & time: 09/13/18  1907    History   Chief Complaint Chief Complaint  Patient presents with  . Hypertension  . Dizziness    HPI Tina Waller is a 78 y.o. female presenting for evaluation of dizziness and hypertension.   Patient states she has had intermittent dizziness for the past 5 days. Today, her dizziness was worse and she felt like she was going to pass out.  Dizziness is worse with position changes, but does not always happen with position changes.  Last for several minutes before resolving out intervention.  She describes the dizziness as the room is spinning.  Patient states in 2008 she was told that she had vertigo, but then the next day she followed up in the ER and was found to have a stroke.  She has not had any dizziness or vertigo symptoms since.  Patient states she is also been having issues of with her blood pressure since December, for the past 3 months.  She has had multiple medication changes with her PCP, but blood pressures remained elevated.  Patient states that she is currently on 4 different blood pressure medications, losartan, amlodipine, metoprolol, and furosemide.  Patient states she has taken them all today.  She denies vision changes, slurred speech, headache, chest pain, shortness breath, nausea, vomiting, abdominal pain, urinary symptoms.  Patient reports chronic issues with diarrhea, unchanged from normal.  Patient states he has been eating and drinking well.  Additional history obtained from chart review.  Patient with a history of hypertension, stroke in 2008, CKD.  Per chart review, stroke in 2008 showed source was the vertebral arteries.     HPI  Past Medical History:  Diagnosis Date  . Anemia   . Anxiety   . Cataract   . Depression   . Diabetes mellitus without complication ([place])    pt denies  . Gout   . Hypertension   . Stroke [facility]) 2008     Patient Active Problem List   Diagnosis Date Noted  . Primary hyperparathyroidism ([place]) 02/10/2018  . Vitamin B 12 deficiency 12/11/2017  . Pulmonary hypertension, mild ([place]) 12/11/2017  . CKD (chronic kidney disease) stage 3, GFR 30-59 ml/min ([facility]) 04/17/2017  . Stasis dermatitis of both legs 04/17/2017  . Osteoporosis 12/22/2016  . Hx of gout 12/22/2016  . Estrogen deficiency 05/12/2016  . Vitamin D deficiency 05/12/2016  . Weight loss 05/12/2016  . HTN (hypertension) 06/12/2015  . History of CVA (cerebrovascular accident) 03/28/2015    Past Surgical History:  Procedure Laterality Date  . ABDOMINAL HYSTERECTOMY    . CATARACT EXTRACTION Left      OB History   No obstetric history on file.      Home Medications    Prior to Admission medications   Medication Sig Start Date End Date Taking? Authorizing Provider  amLODipine (NORVASC) 5 MG tablet Take 1 tablet (5 mg total) by mouth daily. 09/13/18   [name], MD  aspirin EC 81 MG tablet Take 81 mg by mouth daily.    [provider]  Cyanocobalamin (VITAMIN B 12 PO) Take 1 tablet by mouth daily.    [provider]  furosemide (LASIX) 40 MG tablet Take 1 tablet (40 mg total) by mouth daily. 07/27/18   [name], MD  ibandronate (BONIVA) 150 MG tablet Take 1 tablet (150 mg total) by mouth every 30 (thirty) days. Take with  a full glass of water, on an empty stomach, and stay upright for 30 min. Patient not taking: Reported on 07/13/2018 04/17/17   [name], MD  losartan (COZAAR) 100 MG tablet Take 1 tablet (100 mg total) by mouth daily. 05/27/18   [name], MD  metoprolol succinate (TOPROL-XL) 25 MG 24 hr tablet Take 1 tab PO daily with a 50 mg tablet for a total of 75 mg daily 08/06/18   [name], MD  metoprolol succinate (TOPROL-XL) 50 MG 24 hr tablet Take 1 tablet (50 mg total) by mouth daily. Take with or immediately following a meal. 07/27/18   [name],  MD  polyethylene glycol [facility]) packet Take 17 g by mouth daily as needed. 10/21/17   Jaynee Eagles, PA-C  potassium chloride (KLOR-CON) 8 MEQ tablet Take 2 tablets (16 mEq total) by mouth daily. 07/21/18   [name], MD  triamcinolone cream (KENALOG) 0.1 % Apply 1 application topically 2 (two) times daily. 01/12/17   [name], MD  VITAMIN D PO Take 1 tablet by mouth daily.    [provider]    Family History Family History  Problem Relation Age of Onset  . Heart disease Mother   . Osteoporosis Neg Hx     Social History Social History   Tobacco Use  . Smoking status: Former Smoker    Last attempt to quit: 07/14/2006    Years since quitting: 12.1  . Smokeless tobacco: Never Used  Substance Use Topics  . Alcohol use: Yes    Alcohol/week: 7.0 standard drinks    Types: 7 Shots of liquor per week    Comment: wine and vodka  . Drug use: No     Allergies   Lipitor [atorvastatin] and Lisinopril   Review of Systems Review of Systems  Neurological: Positive for dizziness and syncope (near syncope).  All other systems reviewed and are negative.    Physical Exam Updated Vital Signs BP 106/83   Pulse 66   Temp 98.2 F (36.8 C) (Oral)   Resp (!) 24   SpO2 98%   Physical Exam Vitals signs and nursing note reviewed.  Constitutional:      General: She is not in acute distress.    Appearance: She is well-developed.     Comments: Elderly female resting in the bed in no acute distress  HENT:     Head: Normocephalic and atraumatic.     Mouth/Throat:     Mouth: Mucous membranes are moist.  Eyes:     Extraocular Movements: Extraocular movements intact.     Conjunctiva/sclera: Conjunctivae normal.     Pupils: Pupils are equal, round, and reactive to light.     Comments: EOMI and PERRLA.  No nystagmus.  Neck:     Musculoskeletal: Normal range of motion and neck supple.  Cardiovascular:     Rate and Rhythm: Normal rate and regular rhythm.     Pulses:  Normal pulses.  Pulmonary:     Effort: Pulmonary effort is normal. No respiratory distress.     Breath sounds: Normal breath sounds. No wheezing.     Comments: Clear lung sounds in all fields Abdominal:     General: There is no distension.     Palpations: Abdomen is soft. There is no mass.     Tenderness: There is no abdominal tenderness. There is no guarding or rebound.  Musculoskeletal: Normal range of motion.     Comments: Mild bilateral pedal pitting  edema, patient states this is baseline.   Skin:    General: Skin is warm and dry.     Capillary Refill: Capillary refill takes less than 2 seconds.  Neurological:     General: No focal deficit present.     Mental Status: She is alert and oriented to person, place, and time.     GCS: GCS eye subscore is 4. GCS verbal subscore is 5. GCS motor subscore is 6.     Cranial Nerves: Cranial nerves are intact.     Sensory: Sensation is intact.     Motor: Motor function is intact. No pronator drift.     Coordination: Coordination is intact. Romberg sign negative.     Gait: Gait is intact.     Comments: No obvious neurologic deficits.  CN intact.  Nose to finger intact.  Grip strength intact.  Negative pronator drift.  Negative Romberg.  Strength intact x4.  Sensation intact x4.  Patient is ambulatory with mild assistance, uses a walker at home.  No obvious ataxia.  Psychiatric:        Mood and Affect: Mood normal.      ED Treatments / Results  Labs (all labs ordered are listed, but only abnormal results are displayed) Labs Reviewed  BASIC METABOLIC PANEL - Abnormal; Notable for the following components:      Result Value   Glucose, Bld 125 (*)    Creatinine, Ser 1.09 (*)    GFR calc non Af Amer 50 (*)    GFR calc Af Amer 58 (*)    All other components within normal limits  URINALYSIS, ROUTINE W REFLEX MICROSCOPIC - Abnormal; Notable for the following components:   APPearance HAZY (*)    Hgb urine dipstick MODERATE (*)    Protein, ur  100 (*)    Bacteria, UA RARE (*)    All other components within normal limits  CBC  I-STAT TROPONIN, ED    EKG EKG Interpretation  Date/Time:  Tuesday September 14 2018 00:45:13 EST Ventricular Rate:  65 PR Interval:  156 QRS Duration: 124 QT Interval:  463 QTC Calculation: 482 R Axis:   -54 Text Interpretation:  Sinus rhythm Nonspecific IVCD with LAD LVH with secondary repolarization abnormality Confirmed by Randal Buba, April ([id number]) on 09/14/2018 12:51:48 AM   Radiology No results found.  Procedures Procedures (including critical care time)  Medications Ordered in ED Medications  sodium chloride flush (NS) 0.9 % injection 3 mL (has no administration in time range)     Initial Impression / Assessment and Plan / ED Course  I have reviewed the triage vital signs and the nursing notes.  Pertinent labs & imaging results that were available during my care of the patient were reviewed by me and considered in my medical decision making (see chart for details).        Patient presenting for evaluation of dizziness and elevation of blood pressure.  Physical exam reassuring, no obvious neurologic deficits.  However, history concerning as patient had dizziness as her only symptom last time she had a stroke, in 2008.  Labs show mild elevation in the creatinine from 0.9-1.1, but otherwise reassuring.  EKG concerning for ischemic-like changes.  Although patient has no chest pain, will order troponin for further evaluation.  CT head ordered.  Will consult with neurology regarding further imaging to assess for stroke.  Discussed with Dr. Leonel Ramsay, who recommends MRI at this time.  He will evaluate the patient.  CT had negative.  Troponin  negative.  Case discussed with attending, Dr. Nicholes Stairs evaluated the patient.  Plan was to give 10 of hydralazine to lower patient's blood pressure.  Prior to being given, patient's blood pressure dropped to 106.  This was rechecked, and verified.  This may  explain patient's intermittent dizziness and near syncopal feeling.  This is likely related to blood pressure medication administration.  I am concerned that if patient were to go home, her blood pressure can drop again and she may fall/have adverse outcomes.  Will call for admission.  Blood pressure elevated again up to the upper 180s.   Case discussed with Dr. Olevia Bowens from [facility], pt to be admitted.   Final Clinical Impressions(s) / ED Diagnoses   Final diagnoses:  Dizziness  Pre-syncope  Blood pressure alteration    ED Discharge Orders    None       Franchot Heidelberg, PA-C 09/14/18 0228    Palumbo, April, MD 09/14/18 [phone]

## 2018-09-14 NOTE — Progress Notes (Signed)
Pt c/o new on set blood in her stool and urine that started few days before current hospitalization. MD notified.

## 2018-09-14 NOTE — Progress Notes (Addendum)
STROKE TEAM PROGRESS NOTE   INTERVAL HISTORY Multiple family members at bedside.  Patient lying in bed without complaints.  Neurologically unchanged.   Vitals:   09/14/18 0530 09/14/18 0600 09/14/18 0615 09/14/18 0852  BP: (!) 157/77 (!) 150/73 (!) 187/91 (!) 176/71  Pulse: 75 70 67 67  Resp: 17 14 17 16   Temp:    98.5 F (36.9 C)  TempSrc:    Oral  SpO2: 97% 99% 98% 97%    CBC:  Recent Labs  Lab 09/13/18 1946  WBC 7.1  HGB 12.8  HCT 38.3  MCV 95.8  PLT 676    Basic Metabolic Panel:  Recent Labs  Lab 09/13/18 1946  NA 137  K 3.7  CL 104  CO2 24  GLUCOSE 125*  BUN 18  CREATININE 1.09*  CALCIUM 9.4  MG 1.8  PHOS 3.0   Lipid Panel:     Component Value Date/Time   CHOL 215 (H) 09/14/2018 0511   TRIG 76 09/14/2018 0511   HDL 41 09/14/2018 0511   CHOLHDL 5.2 09/14/2018 0511   VLDL 15 09/14/2018 0511   LDLCALC 159 (H) 09/14/2018 0511   HgbA1c:  Lab Results  Component Value Date   HGBA1C 5.6 09/14/2018   Urine Drug Screen: No results found for: LABOPIA, COCAINSCRNUR, LABBENZ, AMPHETMU, THCU, LABBARB  Alcohol Level No results found for: ETH  IMAGING Ct Angio Head W Or Wo Contrast  Result Date: 09/14/2018 CLINICAL DATA:  78 year old female with dizziness for 1 week and small acute infarct in the right cerebellum on MRI earlier today. EXAM: CT ANGIOGRAPHY HEAD AND NECK TECHNIQUE: Multidetector CT imaging of the head and neck was performed using the standard protocol during bolus administration of intravenous contrast. Multiplanar CT image reconstructions and MIPs were obtained to evaluate the vascular anatomy. Carotid stenosis measurements (when applicable) are obtained utilizing NASCET criteria, using the distal internal carotid diameter as the denominator. CONTRAST:  70mL ISOVUE-370 IOPAMIDOL (ISOVUE-370) INJECTION 76% COMPARISON:  Head CT and MRI earlier today. FINDINGS: CTA NECK Skeleton: Carious mandible dentition and absent maxillary dentition. Mild for age  cervical spine degeneration. No acute osseous abnormality identified. Upper chest: Severe emphysema. No superior mediastinal lymphadenopathy. Other neck: Asymmetrically rounded and hyperenhancing right lateral pharyngeal wall/tonsillar pillar on series 7, image 98 and series 10, image 107 encompassing up to 22 millimeters. Superimposed postinflammatory appearing dystrophic calcifications. No pathologically enlarged lymph nodes, although a right level 2 lymph node is mildly heterogeneous on series 11, image 65. Other neck soft tissues appear grossly normal. Aortic arch: Calcified and soft aortic atherosclerosis. 3 vessel arch configuration. Right carotid system: No brachiocephalic artery or right CCA origin stenosis despite plaque. Mildly tortuous right CCA. Mild soft and calcified plaque at the bifurcation and right ICA bulb. Mild to moderate calcified plaque in the medial vessel just below the skull base, but no significant stenosis. Left carotid system: No left CCA origin stenosis despite soft and calcified plaque. Mildly tortuous vessel with continued atherosclerosis to the bifurcation, no significant stenosis. Plaque at the bifurcation and continuing into the left ICA bulb but no significant stenosis. Tortuous left ICA at the C1 level. Vertebral arteries: No proximal right subclavian artery stenosis. Calcified right vertebral artery origin and V1 segment with severe tandem stenoses. Minimal calcified right V2 plaque without stenosis. Intermittent right V3 calcified plaque without significant stenosis. The vessel remains patent to the skull base. No proximal left subclavian artery stenosis despite soft and calcified plaque. Normal left vertebral artery origin. Mild V1  segment plaque without stenosis. Intermittent V2 segment plaque without significant stenosis. Moderate left V3 segment plaque with up to 50% stenosis. The vessel remains patent to the skull base. CTA HEAD Posterior circulation: There is intermittent  right V4 calcified plaque without hemodynamically significant stenosis. The right PICA origin is patent. The right vertebrobasilar junction is patent. There is more bulky left V4 segment calcified plaque with moderate to severe left V4 segment stenosis on series 9, image 160. But the vessel remains patent to the vertebrobasilar junction. The left AICA appears dominant and is patent. The basilar artery is patent with mild irregularity, no stenosis. Normal SCA and PCA origins. Posterior communicating arteries are diminutive or absent. Mild left and moderate right PCA P2 segment irregularity and stenosis. And there is severe stenosis of the right inferior P3 division on series 14, image 20 but preserved distal enhancement. Left PCA branches are within normal limits. Anterior circulation: Both ICA siphons are patent. The left siphon is heavily calcified with severe cavernous segment stenosis (series 9, image 135). There is mild left supraclinoid stenosis. Normal left ophthalmic artery origin. The right siphon is also heavily calcified with severe right cavernous segment stenosis (series 11, image 95). There is mild right supraclinoid stenosis. Normal right ophthalmic artery origin. Patent carotid termini. Normal MCA and ACA origins. The left A1 is dominant and mildly irregular. The anterior communicating artery appears partially fenestrated, normal variant. ACA A2 segments are within normal limits. There is some moderate irregularity and stenosis in the left A3 branches on series 14, image 22. Left MCA M1 is patent without stenosis. The left MCA bifurcation is patent with mild to moderate stenosis affecting the proximal dominant posterior M2 branch seen on series 12, image 25. Mild to moderate left MCA branch irregularity otherwise. Right MCA M1 and trifurcation are patent without stenosis. There are moderate right MCA M3 branch irregularities and stenosis as seen on series 14, image 15. Venous sinuses: Patent on  delayed images. Anatomic variants: Mildly dominant left A1. Delayed phase: No abnormal enhancement identified. Chronic small and medium-sized vessel ischemic changes redemonstrated. The small medial right PICA territory infarct seen by DWI is not evident. Review of the MIP images confirms the above findings IMPRESSION: 1. Appearance suspicious for a 2.2 cm Right Tonsil/Pharyngeal Mass. Recommend ENT follow-up for direct visualization. No pathologically enlarged nodes, but there is a heterogeneous right level 2 lymph node seen on series 11, image 65. 2. Negative for large vessel occlusion, but positive for widespread atherosclerosis with significant stenoses: - Severe stenoses Right vertebral artery origin and V1 segment. - Severe stenosis Left vertebral V4 segment. - Severe stenosis of the bilateral ICA siphon cavernous segments. - up to Moderate stenosis Right PCA P2 segment, Left MCA M2, and bilateral MCA M3 branches. - Moderate to Severe stenosis of the Right PCA inferior P3, Left ACA A3. 3. Stable CT appearance of the brain from earlier today. 4. Aortic Atherosclerosis (ICD10-I70.0) and Emphysema (ICD10-J43.9). Electronically Signed   By: Genevie Ann M.D.   On: 09/14/2018 06:54   Ct Head Wo Contrast  Result Date: 09/14/2018 CLINICAL DATA:  Vertigo, episodic, peripheral. Dizziness for 1 week. EXAM: CT HEAD WITHOUT CONTRAST TECHNIQUE: Contiguous axial images were obtained from the base of the skull through the vertex without intravenous contrast. COMPARISON:  Head CT and brain MRI 02/01/2007 FINDINGS: Brain: No acute hemorrhage. Multiple remote lacunar infarcts in the pons, right cerebellum, and right basal ganglia. Remote infarct in the left occipital lobe. Generalized atrophy with microvascular  ischemia. No evidence of acute ischemia. No hydrocephalus, mass effect, or midline shift. Vascular: Mild generalized increased density of intracranial vasculature without hyperdense vessel. There is skull base  atherosclerosis. Skull: No fracture or focal lesion. Sinuses/Orbits: Trace chronic debris in right side of sphenoid sinus. No sinus fluid level. The mastoid air cells are clear. The visualized orbits are unremarkable. Prior right cataract resection Other: None. IMPRESSION: 1. No acute intracranial abnormality. 2. Generalized atrophy and chronic ischemia. Multiple remote lacunar infarcts. Remote infarct in the left occipital lobe. Electronically Signed   By: Keith Rake M.D.   On: 09/14/2018 01:12   Ct Angio Neck W Or Wo Contrast  Result Date: 09/14/2018 CLINICAL DATA:  78 year old female with dizziness for 1 week and small acute infarct in the right cerebellum on MRI earlier today. EXAM: CT ANGIOGRAPHY HEAD AND NECK TECHNIQUE: Multidetector CT imaging of the head and neck was performed using the standard protocol during bolus administration of intravenous contrast. Multiplanar CT image reconstructions and MIPs were obtained to evaluate the vascular anatomy. Carotid stenosis measurements (when applicable) are obtained utilizing NASCET criteria, using the distal internal carotid diameter as the denominator. CONTRAST:  31mL ISOVUE-370 IOPAMIDOL (ISOVUE-370) INJECTION 76% COMPARISON:  Head CT and MRI earlier today. FINDINGS: CTA NECK Skeleton: Carious mandible dentition and absent maxillary dentition. Mild for age cervical spine degeneration. No acute osseous abnormality identified. Upper chest: Severe emphysema. No superior mediastinal lymphadenopathy. Other neck: Asymmetrically rounded and hyperenhancing right lateral pharyngeal wall/tonsillar pillar on series 7, image 98 and series 10, image 107 encompassing up to 22 millimeters. Superimposed postinflammatory appearing dystrophic calcifications. No pathologically enlarged lymph nodes, although a right level 2 lymph node is mildly heterogeneous on series 11, image 65. Other neck soft tissues appear grossly normal. Aortic arch: Calcified and soft aortic  atherosclerosis. 3 vessel arch configuration. Right carotid system: No brachiocephalic artery or right CCA origin stenosis despite plaque. Mildly tortuous right CCA. Mild soft and calcified plaque at the bifurcation and right ICA bulb. Mild to moderate calcified plaque in the medial vessel just below the skull base, but no significant stenosis. Left carotid system: No left CCA origin stenosis despite soft and calcified plaque. Mildly tortuous vessel with continued atherosclerosis to the bifurcation, no significant stenosis. Plaque at the bifurcation and continuing into the left ICA bulb but no significant stenosis. Tortuous left ICA at the C1 level. Vertebral arteries: No proximal right subclavian artery stenosis. Calcified right vertebral artery origin and V1 segment with severe tandem stenoses. Minimal calcified right V2 plaque without stenosis. Intermittent right V3 calcified plaque without significant stenosis. The vessel remains patent to the skull base. No proximal left subclavian artery stenosis despite soft and calcified plaque. Normal left vertebral artery origin. Mild V1 segment plaque without stenosis. Intermittent V2 segment plaque without significant stenosis. Moderate left V3 segment plaque with up to 50% stenosis. The vessel remains patent to the skull base. CTA HEAD Posterior circulation: There is intermittent right V4 calcified plaque without hemodynamically significant stenosis. The right PICA origin is patent. The right vertebrobasilar junction is patent. There is more bulky left V4 segment calcified plaque with moderate to severe left V4 segment stenosis on series 9, image 160. But the vessel remains patent to the vertebrobasilar junction. The left AICA appears dominant and is patent. The basilar artery is patent with mild irregularity, no stenosis. Normal SCA and PCA origins. Posterior communicating arteries are diminutive or absent. Mild left and moderate right PCA P2 segment irregularity and  stenosis. And there  is severe stenosis of the right inferior P3 division on series 14, image 20 but preserved distal enhancement. Left PCA branches are within normal limits. Anterior circulation: Both ICA siphons are patent. The left siphon is heavily calcified with severe cavernous segment stenosis (series 9, image 135). There is mild left supraclinoid stenosis. Normal left ophthalmic artery origin. The right siphon is also heavily calcified with severe right cavernous segment stenosis (series 11, image 95). There is mild right supraclinoid stenosis. Normal right ophthalmic artery origin. Patent carotid termini. Normal MCA and ACA origins. The left A1 is dominant and mildly irregular. The anterior communicating artery appears partially fenestrated, normal variant. ACA A2 segments are within normal limits. There is some moderate irregularity and stenosis in the left A3 branches on series 14, image 22. Left MCA M1 is patent without stenosis. The left MCA bifurcation is patent with mild to moderate stenosis affecting the proximal dominant posterior M2 branch seen on series 12, image 25. Mild to moderate left MCA branch irregularity otherwise. Right MCA M1 and trifurcation are patent without stenosis. There are moderate right MCA M3 branch irregularities and stenosis as seen on series 14, image 15. Venous sinuses: Patent on delayed images. Anatomic variants: Mildly dominant left A1. Delayed phase: No abnormal enhancement identified. Chronic small and medium-sized vessel ischemic changes redemonstrated. The small medial right PICA territory infarct seen by DWI is not evident. Review of the MIP images confirms the above findings IMPRESSION: 1. Appearance suspicious for a 2.2 cm Right Tonsil/Pharyngeal Mass. Recommend ENT follow-up for direct visualization. No pathologically enlarged nodes, but there is a heterogeneous right level 2 lymph node seen on series 11, image 65. 2. Negative for large vessel occlusion, but  positive for widespread atherosclerosis with significant stenoses: - Severe stenoses Right vertebral artery origin and V1 segment. - Severe stenosis Left vertebral V4 segment. - Severe stenosis of the bilateral ICA siphon cavernous segments. - up to Moderate stenosis Right PCA P2 segment, Left MCA M2, and bilateral MCA M3 branches. - Moderate to Severe stenosis of the Right PCA inferior P3, Left ACA A3. 3. Stable CT appearance of the brain from earlier today. 4. Aortic Atherosclerosis (ICD10-I70.0) and Emphysema (ICD10-J43.9). Electronically Signed   By: Genevie Ann M.D.   On: 09/14/2018 06:54   Mr Brain Wo Contrast (neuro Protocol)  Result Date: 09/14/2018 CLINICAL DATA:  Vertigo, episodic, peripheral EXAM: MRI HEAD WITHOUT CONTRAST TECHNIQUE: Multiplanar, multiecho pulse sequences of the brain and surrounding structures were obtained without intravenous contrast. COMPARISON:  02/02/2007 FINDINGS: Brain: Subcentimeter acute right cerebellar infarct. Remote lacunar infarcts in the bilateral pons and left thalamus-the latter new from 2008. Remote perforator infarct in the right lateral lenticulostriate distribution and in the right cerebellum. Small remote left occipital cortex infarct that is new from 2008. No acute hemorrhage, hydrocephalus, collection, or masslike finding. Mild atrophy. Vascular: Major flow voids are preserved Skull and upper cervical spine: Negative for marrow lesion Sinuses/Orbits: Right cataract resection IMPRESSION: 1. Small acute infarct in the right cerebellum. 2. Remote small vessel infarcts as described, progressed from 2008. Electronically Signed   By: Monte Fantasia M.D.   On: 09/14/2018 04:04    PHYSICAL EXAM Frail elderly Caucasian lady not in distress. . Afebrile. Head is nontraumatic. Neck is supple without bruit.    Cardiac exam no murmur or gallop. Lungs are clear to auscultation. Distal pulses are well felt. Neurological Exam ;  Awake  Alert oriented x 3. Normal speech and  language.eye movements full without nystagmus.fundi were  not visualized. Vision acuity and fields appear normal. Hearing is normal. Palatal movements are normal. Face symmetric. Tongue midline. Normal strength, tone, reflexes and coordination. Normal sensation. Gait deferred.   ASSESSMENT/PLAN [name] is a 78 y.o. female with history of hypertension, diabetes, stroke, gout and depression presenting with dizziness since last Sunday.   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  Generalized atrophy.  Chronic ischemia.  Multiple old lacunar infarcts.  Remote left occipital infarct.  MRI small right cerebellar infarct.  Small vessel disease.  CTA head & neck suspicious 2.2 cm right tonsil/pharyngeal mass with ENT follow-up recommended.  Heterogeneous right level 2 lymph node.  No LVO but has widespread atherosclerosis with significant stenosis (RVA origin, R V1, L V4, B ICA siphon cavernous, R P2, L M2, B M3) aortic atherosclerosis.  Emphysema.  2D Echo pending  LDL 159  HgbA1c 5.6  Lovenox 40 mg sq daily for VTE prophylaxis  aspirin 81 mg daily prior to admission, now on aspirin 81 mg daily and clopidogrel 75 mg daily following load.  Continue dual antiplatelets for 3 months then Plavix alone.  Therapy recommendations: Pending  Disposition: Pending  Hypertensive urgency . Improving  . Permissive hypertension (OK if < 220/120) but gradually normalize in 5-7 days . Long-term BP goal normotensive  Hyperlipidemia  Home meds: No statin  LDL 159, goal < 70  Intolerant to statins in the past due to diarrhea  Other Stroke Risk Factors  Advanced age  Former cigarette smoker, quit 12 years ago   ETOH use, advised to drink no more than 1 drink(s) a day  Hx stroke/TIA  Other Active Problems  Chronic kidney disease stage III  Anxiety  Right tonsil/pharyngeal mass  Hospital day #  0  Burnetta Sabin, MSN, APRN, ANVP-BC, AGPCNP-BC Advanced Practice Stroke Nurse [place] for Schedule & Pager information 09/14/2018 3:01 PM  I have personally obtained history,examined this patient, reviewed notes, independently viewed imaging studies, participated in medical decision making and plan of care.ROS completed by me personally and pertinent positives fully documented  I have made any additions or clarifications directly to the above note. Agree with note above.  She presented with dizziness and gait difficulties due to embolic right cerebellar infarct etiology to be determined. Continue ongoing stroke workup. CT angiogram shows severe right vertebral artery proximal stenosis which could be the source for stroke hence recommend dual antiplatelet therapy aspirin and Plavix for 3 months followed by aspirin alone. Discussed with patient and family and answered questions. Greater than 50% time during this 35 minute visit was spent in counseling and coordination of care about her stroke and vertebral artery stenosis and answering questions  Antony Contras, [place] Pager: [phone] 09/14/2018 5:49 PM  To contact Stroke Continuity provider, please refer to [email or web address] After hours, contact General Neurology

## 2018-09-14 NOTE — Progress Notes (Signed)
  Echocardiogram 2D Echocardiogram has been performed.  Tina Waller 09/14/2018, 3:47 PM

## 2018-09-14 NOTE — Progress Notes (Signed)
PROGRESS NOTE                                                                                                                                                                                                             Patient Demographics:    Tina Waller, is a 78 y.o. female, DOB - [birth year], OXB:[id number]  Admit date - 09/13/2018   Admitting Physician Reubin Milan, MD  Outpatient Primary MD for the patient is [name], MD  LOS - 0   Chief Complaint  Patient presents with  . Hypertension  . Dizziness       Brief Narrative    Is a no charge note as patient admitted earlier today  78 y.o. female with medical history significant of anemia, anxiety, depression, cataracts, type 2 diabetes, gout, hypertension, history of CVA who is coming to the emergency department with complaints of dizziness for the past week, started last Thursday, in ED MRI significant for acute CVA, she was admitted for further CVA work-up and neuro evaluation .   Subjective:    Tina Waller today no fever, chills, reports her dizziness has improved, otherwise denies any other tingling, numbness or focal deficits     Assessment  & Plan :    Principal Problem:   Hypertensive urgency Active Problems:   CKD (chronic kidney disease) stage 3, GFR 30-59 ml/min (HCC)   Type 2 diabetes mellitus (HCC)   Anxiety   Acute CVA (cerebrovascular accident) (House)   Acute CVA (cerebrovascular accident)  -MRI of the brain significant for punctate cerebral infarct, neurology input greatly appreciated -Neck negative for large vessel occlusion, but significant for widespread atherosclerosis with significant stenosis -PT/OT/SLP consulted -A1c is 5.6 - LDL is d at 159 -Follow on 2D echo -Now continue with aspirin and Plavix, will await further recommendation from neurology     Hypertensive urgency Will need to allow permissive hypertension. Monitor BP  closely.    CKD (chronic kidney disease) stage 3, GFR 30-59 ml/min (HCC) Monitor renal function and electrolytes.    Type 2 diabetes mellitus (HCC) Carbohydrate modified diet. CBG monitoring with regular insulin sliding scale while in the hospital. .    Anxiety Anxiolytics as needed.  Right tonsil/pharyngeal mass -I have discussed with ENT Dr. Benjamine Mola, this will need a biopsy, he is to be given his office  information on discharge  to call for outpatient appointment   Code Status : full  Family Communication  : none at bedside  Disposition Plan  : home  Consults  :  Neurology  Procedures  : none  DVT Prophylaxis  :  House lovenox  Lab Results  Component Value Date   PLT 275 09/13/2018    Antibiotics  :   Anti-infectives (From admission, onward)   None        Objective:   Vitals:   09/14/18 0600 09/14/18 0615 09/14/18 0852 09/14/18 1020  BP: (!) 150/73 (!) 187/91 (!) 176/71 (!) 164/64  Pulse: 70 67 67 63  Resp: 14 17 16 16   Temp:   98.5 F (36.9 C) (!) 97.3 F (36.3 C)  TempSrc:   Oral Oral  SpO2: 99% 98% 97% 98%    Wt Readings from Last 3 Encounters:  09/06/18 62.5 kg  07/20/18 60.8 kg  07/13/18 56.7 kg    No intake or output data in the 24 hours ending 09/14/18 1225   Physical Exam  Awake Alert, Oriented X 3, No new F.N deficits, Normal affect Symmetrical Chest wall movement, Good air movement bilaterally, CTAB RRR,No Gallops,Rubs or new Murmurs, No Parasternal Heave +ve B.Sounds, Abd Soft, No tenderness,   Extremities with no edema, clubbing or cyanosis    Data Review:    CBC Recent Labs  Lab 09/13/18 1946  WBC 7.1  HGB 12.8  HCT 38.3  PLT 275  MCV 95.8  MCH 32.0  MCHC 33.4  RDW 12.0    Chemistries  Recent Labs  Lab 09/13/18 1946  NA 137  K 3.7  CL 104  CO2 24  GLUCOSE 125*  BUN 18  CREATININE 1.09*  CALCIUM 9.4  MG 1.8    ------------------------------------------------------------------------------------------------------------------ Recent Labs    09/14/18 0511  CHOL 215*  HDL 41  LDLCALC 159*  TRIG 76  CHOLHDL 5.2    Lab Results  Component Value Date   HGBA1C 5.6 09/14/2018   ------------------------------------------------------------------------------------------------------------------ No results for input(s): TSH, T4TOTAL, T3FREE, THYROIDAB in the last 72 hours.  Invalid input(s): FREET3 ------------------------------------------------------------------------------------------------------------------ No results for input(s): VITAMINB12, FOLATE, FERRITIN, TIBC, IRON, RETICCTPCT in the last 72 hours.  Coagulation profile No results for input(s): INR, PROTIME in the last 168 hours.  No results for input(s): DDIMER in the last 72 hours.  Cardiac Enzymes Recent Labs  Lab 09/14/18 0847  TROPONINI <0.03   ------------------------------------------------------------------------------------------------------------------    Component Value Date/Time   BNP 135.5 (H) 11/05/2017 1559    Inpatient Medications  Scheduled Meds: .  stroke: mapping our early stages of recovery book   Does not apply Once  . aspirin EC  81 mg Oral Daily  . [START ON 09/15/2018] clopidogrel  75 mg Oral Daily  . enoxaparin (LOVENOX) injection  40 mg Subcutaneous Q24H  . insulin aspart  0-9 Units Subcutaneous TID WC  . iopamidol      . sodium chloride flush  3 mL Intravenous Once   Continuous Infusions: PRN Meds:.acetaminophen **OR** acetaminophen (TYLENOL) oral liquid 160 mg/5 mL **OR** acetaminophen, metoprolol tartrate, ondansetron **OR** ondansetron (ZOFRAN) IV, polyethylene glycol  Micro Results No results found for this or any previous visit (from the past 240 hour(s)).  Radiology Reports Ct Angio Head W Or Wo Contrast  Result Date: 09/14/2018 CLINICAL DATA:  78 year old female with dizziness for 1  week and small acute infarct in the right cerebellum on MRI earlier today. EXAM: CT ANGIOGRAPHY HEAD AND NECK TECHNIQUE:  Multidetector CT imaging of the head and neck was performed using the standard protocol during bolus administration of intravenous contrast. Multiplanar CT image reconstructions and MIPs were obtained to evaluate the vascular anatomy. Carotid stenosis measurements (when applicable) are obtained utilizing NASCET criteria, using the distal internal carotid diameter as the denominator. CONTRAST:  11mL ISOVUE-370 IOPAMIDOL (ISOVUE-370) INJECTION 76% COMPARISON:  Head CT and MRI earlier today. FINDINGS: CTA NECK Skeleton: Carious mandible dentition and absent maxillary dentition. Mild for age cervical spine degeneration. No acute osseous abnormality identified. Upper chest: Severe emphysema. No superior mediastinal lymphadenopathy. Other neck: Asymmetrically rounded and hyperenhancing right lateral pharyngeal wall/tonsillar pillar on series 7, image 98 and series 10, image 107 encompassing up to 22 millimeters. Superimposed postinflammatory appearing dystrophic calcifications. No pathologically enlarged lymph nodes, although a right level 2 lymph node is mildly heterogeneous on series 11, image 65. Other neck soft tissues appear grossly normal. Aortic arch: Calcified and soft aortic atherosclerosis. 3 vessel arch configuration. Right carotid system: No brachiocephalic artery or right CCA origin stenosis despite plaque. Mildly tortuous right CCA. Mild soft and calcified plaque at the bifurcation and right ICA bulb. Mild to moderate calcified plaque in the medial vessel just below the skull base, but no significant stenosis. Left carotid system: No left CCA origin stenosis despite soft and calcified plaque. Mildly tortuous vessel with continued atherosclerosis to the bifurcation, no significant stenosis. Plaque at the bifurcation and continuing into the left ICA bulb but no significant stenosis. Tortuous  left ICA at the C1 level. Vertebral arteries: No proximal right subclavian artery stenosis. Calcified right vertebral artery origin and V1 segment with severe tandem stenoses. Minimal calcified right V2 plaque without stenosis. Intermittent right V3 calcified plaque without significant stenosis. The vessel remains patent to the skull base. No proximal left subclavian artery stenosis despite soft and calcified plaque. Normal left vertebral artery origin. Mild V1 segment plaque without stenosis. Intermittent V2 segment plaque without significant stenosis. Moderate left V3 segment plaque with up to 50% stenosis. The vessel remains patent to the skull base. CTA HEAD Posterior circulation: There is intermittent right V4 calcified plaque without hemodynamically significant stenosis. The right PICA origin is patent. The right vertebrobasilar junction is patent. There is more bulky left V4 segment calcified plaque with moderate to severe left V4 segment stenosis on series 9, image 160. But the vessel remains patent to the vertebrobasilar junction. The left AICA appears dominant and is patent. The basilar artery is patent with mild irregularity, no stenosis. Normal SCA and PCA origins. Posterior communicating arteries are diminutive or absent. Mild left and moderate right PCA P2 segment irregularity and stenosis. And there is severe stenosis of the right inferior P3 division on series 14, image 20 but preserved distal enhancement. Left PCA branches are within normal limits. Anterior circulation: Both ICA siphons are patent. The left siphon is heavily calcified with severe cavernous segment stenosis (series 9, image 135). There is mild left supraclinoid stenosis. Normal left ophthalmic artery origin. The right siphon is also heavily calcified with severe right cavernous segment stenosis (series 11, image 95). There is mild right supraclinoid stenosis. Normal right ophthalmic artery origin. Patent carotid termini. Normal MCA  and ACA origins. The left A1 is dominant and mildly irregular. The anterior communicating artery appears partially fenestrated, normal variant. ACA A2 segments are within normal limits. There is some moderate irregularity and stenosis in the left A3 branches on series 14, image 22. Left MCA M1 is patent without stenosis. The left MCA bifurcation  is patent with mild to moderate stenosis affecting the proximal dominant posterior M2 branch seen on series 12, image 25. Mild to moderate left MCA branch irregularity otherwise. Right MCA M1 and trifurcation are patent without stenosis. There are moderate right MCA M3 branch irregularities and stenosis as seen on series 14, image 15. Venous sinuses: Patent on delayed images. Anatomic variants: Mildly dominant left A1. Delayed phase: No abnormal enhancement identified. Chronic small and medium-sized vessel ischemic changes redemonstrated. The small medial right PICA territory infarct seen by DWI is not evident. Review of the MIP images confirms the above findings IMPRESSION: 1. Appearance suspicious for a 2.2 cm Right Tonsil/Pharyngeal Mass. Recommend ENT follow-up for direct visualization. No pathologically enlarged nodes, but there is a heterogeneous right level 2 lymph node seen on series 11, image 65. 2. Negative for large vessel occlusion, but positive for widespread atherosclerosis with significant stenoses: - Severe stenoses Right vertebral artery origin and V1 segment. - Severe stenosis Left vertebral V4 segment. - Severe stenosis of the bilateral ICA siphon cavernous segments. - up to Moderate stenosis Right PCA P2 segment, Left MCA M2, and bilateral MCA M3 branches. - Moderate to Severe stenosis of the Right PCA inferior P3, Left ACA A3. 3. Stable CT appearance of the brain from earlier today. 4. Aortic Atherosclerosis (ICD10-I70.0) and Emphysema (ICD10-J43.9). Electronically Signed   By: Genevie Ann M.D.   On: 09/14/2018 06:54   Ct Head Wo Contrast  Result Date:  09/14/2018 CLINICAL DATA:  Vertigo, episodic, peripheral. Dizziness for 1 week. EXAM: CT HEAD WITHOUT CONTRAST TECHNIQUE: Contiguous axial images were obtained from the base of the skull through the vertex without intravenous contrast. COMPARISON:  Head CT and brain MRI 02/01/2007 FINDINGS: Brain: No acute hemorrhage. Multiple remote lacunar infarcts in the pons, right cerebellum, and right basal ganglia. Remote infarct in the left occipital lobe. Generalized atrophy with microvascular ischemia. No evidence of acute ischemia. No hydrocephalus, mass effect, or midline shift. Vascular: Mild generalized increased density of intracranial vasculature without hyperdense vessel. There is skull base atherosclerosis. Skull: No fracture or focal lesion. Sinuses/Orbits: Trace chronic debris in right side of sphenoid sinus. No sinus fluid level. The mastoid air cells are clear. The visualized orbits are unremarkable. Prior right cataract resection Other: None. IMPRESSION: 1. No acute intracranial abnormality. 2. Generalized atrophy and chronic ischemia. Multiple remote lacunar infarcts. Remote infarct in the left occipital lobe. Electronically Signed   By: Keith Rake M.D.   On: 09/14/2018 01:12   Ct Angio Neck W Or Wo Contrast  Result Date: 09/14/2018 CLINICAL DATA:  78 year old female with dizziness for 1 week and small acute infarct in the right cerebellum on MRI earlier today. EXAM: CT ANGIOGRAPHY HEAD AND NECK TECHNIQUE: Multidetector CT imaging of the head and neck was performed using the standard protocol during bolus administration of intravenous contrast. Multiplanar CT image reconstructions and MIPs were obtained to evaluate the vascular anatomy. Carotid stenosis measurements (when applicable) are obtained utilizing NASCET criteria, using the distal internal carotid diameter as the denominator. CONTRAST:  53mL ISOVUE-370 IOPAMIDOL (ISOVUE-370) INJECTION 76% COMPARISON:  Head CT and MRI earlier today. FINDINGS:  CTA NECK Skeleton: Carious mandible dentition and absent maxillary dentition. Mild for age cervical spine degeneration. No acute osseous abnormality identified. Upper chest: Severe emphysema. No superior mediastinal lymphadenopathy. Other neck: Asymmetrically rounded and hyperenhancing right lateral pharyngeal wall/tonsillar pillar on series 7, image 98 and series 10, image 107 encompassing up to 22 millimeters. Superimposed postinflammatory appearing dystrophic calcifications. No pathologically  enlarged lymph nodes, although a right level 2 lymph node is mildly heterogeneous on series 11, image 65. Other neck soft tissues appear grossly normal. Aortic arch: Calcified and soft aortic atherosclerosis. 3 vessel arch configuration. Right carotid system: No brachiocephalic artery or right CCA origin stenosis despite plaque. Mildly tortuous right CCA. Mild soft and calcified plaque at the bifurcation and right ICA bulb. Mild to moderate calcified plaque in the medial vessel just below the skull base, but no significant stenosis. Left carotid system: No left CCA origin stenosis despite soft and calcified plaque. Mildly tortuous vessel with continued atherosclerosis to the bifurcation, no significant stenosis. Plaque at the bifurcation and continuing into the left ICA bulb but no significant stenosis. Tortuous left ICA at the C1 level. Vertebral arteries: No proximal right subclavian artery stenosis. Calcified right vertebral artery origin and V1 segment with severe tandem stenoses. Minimal calcified right V2 plaque without stenosis. Intermittent right V3 calcified plaque without significant stenosis. The vessel remains patent to the skull base. No proximal left subclavian artery stenosis despite soft and calcified plaque. Normal left vertebral artery origin. Mild V1 segment plaque without stenosis. Intermittent V2 segment plaque without significant stenosis. Moderate left V3 segment plaque with up to 50% stenosis. The  vessel remains patent to the skull base. CTA HEAD Posterior circulation: There is intermittent right V4 calcified plaque without hemodynamically significant stenosis. The right PICA origin is patent. The right vertebrobasilar junction is patent. There is more bulky left V4 segment calcified plaque with moderate to severe left V4 segment stenosis on series 9, image 160. But the vessel remains patent to the vertebrobasilar junction. The left AICA appears dominant and is patent. The basilar artery is patent with mild irregularity, no stenosis. Normal SCA and PCA origins. Posterior communicating arteries are diminutive or absent. Mild left and moderate right PCA P2 segment irregularity and stenosis. And there is severe stenosis of the right inferior P3 division on series 14, image 20 but preserved distal enhancement. Left PCA branches are within normal limits. Anterior circulation: Both ICA siphons are patent. The left siphon is heavily calcified with severe cavernous segment stenosis (series 9, image 135). There is mild left supraclinoid stenosis. Normal left ophthalmic artery origin. The right siphon is also heavily calcified with severe right cavernous segment stenosis (series 11, image 95). There is mild right supraclinoid stenosis. Normal right ophthalmic artery origin. Patent carotid termini. Normal MCA and ACA origins. The left A1 is dominant and mildly irregular. The anterior communicating artery appears partially fenestrated, normal variant. ACA A2 segments are within normal limits. There is some moderate irregularity and stenosis in the left A3 branches on series 14, image 22. Left MCA M1 is patent without stenosis. The left MCA bifurcation is patent with mild to moderate stenosis affecting the proximal dominant posterior M2 branch seen on series 12, image 25. Mild to moderate left MCA branch irregularity otherwise. Right MCA M1 and trifurcation are patent without stenosis. There are moderate right MCA M3  branch irregularities and stenosis as seen on series 14, image 15. Venous sinuses: Patent on delayed images. Anatomic variants: Mildly dominant left A1. Delayed phase: No abnormal enhancement identified. Chronic small and medium-sized vessel ischemic changes redemonstrated. The small medial right PICA territory infarct seen by DWI is not evident. Review of the MIP images confirms the above findings IMPRESSION: 1. Appearance suspicious for a 2.2 cm Right Tonsil/Pharyngeal Mass. Recommend ENT follow-up for direct visualization. No pathologically enlarged nodes, but there is a heterogeneous right level 2  lymph node seen on series 11, image 65. 2. Negative for large vessel occlusion, but positive for widespread atherosclerosis with significant stenoses: - Severe stenoses Right vertebral artery origin and V1 segment. - Severe stenosis Left vertebral V4 segment. - Severe stenosis of the bilateral ICA siphon cavernous segments. - up to Moderate stenosis Right PCA P2 segment, Left MCA M2, and bilateral MCA M3 branches. - Moderate to Severe stenosis of the Right PCA inferior P3, Left ACA A3. 3. Stable CT appearance of the brain from earlier today. 4. Aortic Atherosclerosis (ICD10-I70.0) and Emphysema (ICD10-J43.9). Electronically Signed   By: Genevie Ann M.D.   On: 09/14/2018 06:54   Mr Brain Wo Contrast (neuro Protocol)  Result Date: 09/14/2018 CLINICAL DATA:  Vertigo, episodic, peripheral EXAM: MRI HEAD WITHOUT CONTRAST TECHNIQUE: Multiplanar, multiecho pulse sequences of the brain and surrounding structures were obtained without intravenous contrast. COMPARISON:  02/02/2007 FINDINGS: Brain: Subcentimeter acute right cerebellar infarct. Remote lacunar infarcts in the bilateral pons and left thalamus-the latter new from 2008. Remote perforator infarct in the right lateral lenticulostriate distribution and in the right cerebellum. Small remote left occipital cortex infarct that is new from 2008. No acute hemorrhage,  hydrocephalus, collection, or masslike finding. Mild atrophy. Vascular: Major flow voids are preserved Skull and upper cervical spine: Negative for marrow lesion Sinuses/Orbits: Right cataract resection IMPRESSION: 1. Small acute infarct in the right cerebellum. 2. Remote small vessel infarcts as described, progressed from 2008. Electronically Signed   By: Monte Fantasia M.D.   On: 09/14/2018 04:04   Nm Parathyroid W/spect  Result Date: 08/30/2018 CLINICAL DATA:  Hyperparathyroidism. EXAM: NM PARATHYROID SCINTIGRAPHY AND SPECT IMAGING TECHNIQUE: Following intravenous administration of radiopharmaceutical, early and 2-hour delayed planar images were obtained in the anterior projection. Delayed triplanar SPECT images were also obtained at 2 hours. RADIOPHARMACEUTICALS:  24.0 mCi Tc-37m Sestamibi IV COMPARISON:  None. FINDINGS: There is normal uptake on today's study with no convincing evidence of a parathyroid adenoma. IMPRESSION: There is normal uptake on today's study with no convincing evidence of a parathyroid adenoma. Electronically Signed   By: Dorise Bullion III M.D   On: 08/30/2018 12:27    Time Spent in minutes : No Charge   Phillips Climes M.D on 09/14/2018 at 12:25 PM  Between 7am to 7pm - Pager - [phone]  After 7pm go to [email or web address] - password [facility]  [facility] -  Office  [phone]

## 2018-09-15 LAB — GLUCOSE, CAPILLARY
Glucose-Capillary: 80 mg/dL (ref 70–99)
Glucose-Capillary: 99 mg/dL (ref 70–99)

## 2018-09-15 LAB — CBC
HCT: 36.4 % (ref 36.0–46.0)
Hemoglobin: 12.2 g/dL (ref 12.0–15.0)
MCH: 31.4 pg (ref 26.0–34.0)
MCHC: 33.5 g/dL (ref 30.0–36.0)
MCV: 93.6 fL (ref 80.0–100.0)
NRBC: 0 % (ref 0.0–0.2)
Platelets: 249 10*3/uL (ref 150–400)
RBC: 3.89 MIL/uL (ref 3.87–5.11)
RDW: 12.1 % (ref 11.5–15.5)
WBC: 5 10*3/uL (ref 4.0–10.5)

## 2018-09-15 MED ORDER — TRIAMCINOLONE ACETONIDE 0.1 % EX CREA: 1.0000 "application " | TOPICAL_CREAM | Freq: Two times a day (BID) | CUTANEOUS | Status: DC | PRN

## 2018-09-15 MED ORDER — CLOPIDOGREL BISULFATE 75 MG PO TABS: 75.0000 mg | ORAL_TABLET | Freq: Every day | ORAL | 0 refills | Status: DC

## 2018-09-15 MED ORDER — POLYETHYLENE GLYCOL 3350 17 G PO PACK: 17.0000 g | PACK | Freq: Every day | ORAL | Status: AC | PRN

## 2018-09-15 NOTE — Evaluation (Addendum)
Physical Therapy Evaluation Patient Details Name: Tina Waller MRN: [id number] DOB: March 30, 1941 Today's Date: 09/15/2018   History of Present Illness  Pt is a 78 y.o. female with history of hypertension, diabetes, stroke, gout and depression presenting with dizziness since last Sunday (09/12/18). CT head no acute abnormality, Multiple old lacunar infarcts and Remote left occipital infarct. MRI revealed small right cerebellar infarct.      Clinical Impression  Pt presented sitting OOB in recliner chair, awake and willing to participated in PT evaluation. Prior to admission, pt reported that she used a cane to ambulate with PRN. She stated she drives and is able to grocery shop while pushing a cart. Pt lives alone in a single level apartment with one step to enter. She is currently min guard overall for functional mobility with use of a RW to ambulate. No reports of dizziness throughout session. Pt would continue to benefit from skilled physical therapy services at this time while admitted and after d/c to address the below listed limitations in order to improve overall safety and independence with functional mobility.     Follow Up Recommendations Home health PT    Equipment Recommendations  Rolling walker with 5" wheels    Recommendations for Other Services       Precautions / Restrictions Precautions Precautions: Fall Restrictions Weight Bearing Restrictions: No      Mobility  Bed Mobility Overal bed mobility: Modified Independent             General bed mobility comments: pt OOB in recliner chair upon arrival  Transfers Overall transfer level: Needs assistance Equipment used: Rolling walker (2 wheeled) Transfers: Sit to/from Stand Sit to Stand: Min guard         General transfer comment: increased time and effort, good technique, min guard for safety  Ambulation/Gait Ambulation/Gait assistance: Min guard Gait Distance (Feet): 75 Feet Assistive device: Rolling  walker (2 wheeled) Gait Pattern/deviations: Step-through pattern;Decreased stride length Gait velocity: decreased   General Gait Details: pt generally steady with RW, min guard for safety  Stairs            Wheelchair Mobility    Modified Rankin (Stroke Patients Only) Modified Rankin (Stroke Patients Only) Pre-Morbid Rankin Score: Slight disability Modified Rankin: Moderate disability     Balance Overall balance assessment: Needs assistance Sitting-balance support: Feet supported Sitting balance-Leahy Scale: Good Sitting balance - Comments: donning socks seated EOB   Standing balance support: No upper extremity supported Standing balance-Leahy Scale: Fair Standing balance comment: able to maintain static standing with minguard assist, pt often seeking UE support with mobility or dynamic tasks                             Pertinent Vitals/Pain Pain Assessment: No/denies pain Faces Pain Scale: Hurts a little bit Pain Location: indigestion  Pain Descriptors / Indicators: Discomfort Pain Intervention(s): Limited activity within patient's tolerance;Monitored during session    Home Living Family/patient expects to be discharged to:: Private residence Living Arrangements: Alone Available Help at Discharge: Neighbor;Family;Available PRN/intermittently Type of Home: Apartment Home Access: Stairs to enter   Entrance Stairs-Number of Steps: 1 Home Layout: One level Home Equipment: Cane - single point;Shower seat Additional Comments: pt reports daughter checks in 1x/week    Prior Function Level of Independence: Independent with assistive device(s)         Comments: occasional use of SPC; reports she is still driving, grocery shopping  Hand Dominance        Extremity/Trunk Assessment   Upper Extremity Assessment Upper Extremity Assessment: Defer to OT evaluation    Lower Extremity Assessment Lower Extremity Assessment: Generalized weakness        Communication   Communication: No difficulties  Cognition Arousal/Alertness: Awake/alert Behavior During Therapy: WFL for tasks assessed/performed Overall Cognitive Status: Within Functional Limits for tasks assessed                                 General Comments: slight delay in processing/memory but overall appears [facility] for basic functional tasks; requires min safety cues for using RW as pt has not used before, will continue to assess      General Comments      Exercises     Assessment/Plan    PT Assessment Patient needs continued PT services  PT Problem List Decreased strength;Decreased balance;Decreased mobility;Decreased coordination;Decreased knowledge of use of DME;Decreased safety awareness       PT Treatment Interventions DME instruction;Gait training;Stair training;Functional mobility training;Therapeutic activities;Therapeutic exercise;Balance training;Neuromuscular re-education;Patient/family education    PT Goals (Current goals can be found in the Care Plan section)  Acute Rehab PT Goals Patient Stated Goal: to improve her balance PT Goal Formulation: With patient Time For Goal Achievement: 09/29/18 Potential to Achieve Goals: Good    Frequency Min 4X/week   Barriers to discharge        Co-evaluation               AM-PAC PT "6 Clicks" Mobility  Outcome Measure Help needed turning from your back to your side while in a flat bed without using bedrails?: A Little Help needed moving from lying on your back to sitting on the side of a flat bed without using bedrails?: A Little Help needed moving to and from a bed to a chair (including a wheelchair)?: None Help needed standing up from a chair using your arms (e.g., wheelchair or bedside chair)?: None Help needed to walk in hospital room?: A Little Help needed climbing 3-5 steps with a railing? : A Lot 6 Click Score: 19    End of Session Equipment Utilized During Treatment: Gait  belt Activity Tolerance: Patient tolerated treatment well Patient left: in chair;with call bell/phone within reach;with chair alarm set;Other (comment)(MD entering room) Nurse Communication: Mobility status PT Visit Diagnosis: Other abnormalities of gait and mobility (R26.89)    Time: 7616-0737 PT Time Calculation (min) (ACUTE ONLY): 12 min   Charges:   PT Evaluation $PT Eval Low Complexity: Grant, PT, DPT  Acute Rehabilitation Services Pager [phone] Office [place] 09/15/2018, 11:05 AM

## 2018-09-15 NOTE — Progress Notes (Signed)
Patient is being discharged home with home health. Daughter is here for transport. All discharge paperwork went over with patient and daughter. All questions and concerns addressed. Waiting on walker and 3in1 BSC. Patient to be taken down in wheelchair. [name]

## 2018-09-15 NOTE — Discharge Instructions (Signed)

## 2018-09-15 NOTE — Progress Notes (Signed)
STROKE TEAM PROGRESS NOTE   INTERVAL HISTORY Multiple family members at bedside.  Patient sitting up in bedside chair  without complaints.  Neurologically unchanged.   Vitals:   09/15/18 0000 09/15/18 0415 09/15/18 0730 09/15/18 1120  BP: (!) 182/86 (!) 184/82 136/69 (!) 172/79  Pulse: 82 80 84 82  Resp: 18 18 18 16   Temp: 98.3 F (36.8 C) 98.6 F (37 C) 98.5 F (36.9 C) 98.2 F (36.8 C)  TempSrc: Oral Oral Oral Oral  SpO2: 97% 99% 98% 95%    CBC:  Recent Labs  Lab 09/13/18 1946 09/15/18 0520  WBC 7.1 5.0  HGB 12.8 12.2  HCT 38.3 36.4  MCV 95.8 93.6  PLT 275 786    Basic Metabolic Panel:  Recent Labs  Lab 09/13/18 1946  NA 137  K 3.7  CL 104  CO2 24  GLUCOSE 125*  BUN 18  CREATININE 1.09*  CALCIUM 9.4  MG 1.8  PHOS 3.0   Lipid Panel:     Component Value Date/Time   CHOL 215 (H) 09/14/2018 0511   TRIG 76 09/14/2018 0511   HDL 41 09/14/2018 0511   CHOLHDL 5.2 09/14/2018 0511   VLDL 15 09/14/2018 0511   LDLCALC 159 (H) 09/14/2018 0511   HgbA1c:  Lab Results  Component Value Date   HGBA1C 5.6 09/14/2018   Urine Drug Screen: No results found for: LABOPIA, COCAINSCRNUR, LABBENZ, AMPHETMU, THCU, LABBARB  Alcohol Level No results found for: ETH  IMAGING Ct Angio Head W Or Wo Contrast  Result Date: 09/14/2018 CLINICAL DATA:  78 year old female with dizziness for 1 week and small acute infarct in the right cerebellum on MRI earlier today. EXAM: CT ANGIOGRAPHY HEAD AND NECK TECHNIQUE: Multidetector CT imaging of the head and neck was performed using the standard protocol during bolus administration of intravenous contrast. Multiplanar CT image reconstructions and MIPs were obtained to evaluate the vascular anatomy. Carotid stenosis measurements (when applicable) are obtained utilizing NASCET criteria, using the distal internal carotid diameter as the denominator. CONTRAST:  46mL ISOVUE-370 IOPAMIDOL (ISOVUE-370) INJECTION 76% COMPARISON:  Head CT and MRI  earlier today. FINDINGS: CTA NECK Skeleton: Carious mandible dentition and absent maxillary dentition. Mild for age cervical spine degeneration. No acute osseous abnormality identified. Upper chest: Severe emphysema. No superior mediastinal lymphadenopathy. Other neck: Asymmetrically rounded and hyperenhancing right lateral pharyngeal wall/tonsillar pillar on series 7, image 98 and series 10, image 107 encompassing up to 22 millimeters. Superimposed postinflammatory appearing dystrophic calcifications. No pathologically enlarged lymph nodes, although a right level 2 lymph node is mildly heterogeneous on series 11, image 65. Other neck soft tissues appear grossly normal. Aortic arch: Calcified and soft aortic atherosclerosis. 3 vessel arch configuration. Right carotid system: No brachiocephalic artery or right CCA origin stenosis despite plaque. Mildly tortuous right CCA. Mild soft and calcified plaque at the bifurcation and right ICA bulb. Mild to moderate calcified plaque in the medial vessel just below the skull base, but no significant stenosis. Left carotid system: No left CCA origin stenosis despite soft and calcified plaque. Mildly tortuous vessel with continued atherosclerosis to the bifurcation, no significant stenosis. Plaque at the bifurcation and continuing into the left ICA bulb but no significant stenosis. Tortuous left ICA at the C1 level. Vertebral arteries: No proximal right subclavian artery stenosis. Calcified right vertebral artery origin and V1 segment with severe tandem stenoses. Minimal calcified right V2 plaque without stenosis. Intermittent right V3 calcified plaque without significant stenosis. The vessel remains patent to the skull base.  No proximal left subclavian artery stenosis despite soft and calcified plaque. Normal left vertebral artery origin. Mild V1 segment plaque without stenosis. Intermittent V2 segment plaque without significant stenosis. Moderate left V3 segment plaque with up  to 50% stenosis. The vessel remains patent to the skull base. CTA HEAD Posterior circulation: There is intermittent right V4 calcified plaque without hemodynamically significant stenosis. The right PICA origin is patent. The right vertebrobasilar junction is patent. There is more bulky left V4 segment calcified plaque with moderate to severe left V4 segment stenosis on series 9, image 160. But the vessel remains patent to the vertebrobasilar junction. The left AICA appears dominant and is patent. The basilar artery is patent with mild irregularity, no stenosis. Normal SCA and PCA origins. Posterior communicating arteries are diminutive or absent. Mild left and moderate right PCA P2 segment irregularity and stenosis. And there is severe stenosis of the right inferior P3 division on series 14, image 20 but preserved distal enhancement. Left PCA branches are within normal limits. Anterior circulation: Both ICA siphons are patent. The left siphon is heavily calcified with severe cavernous segment stenosis (series 9, image 135). There is mild left supraclinoid stenosis. Normal left ophthalmic artery origin. The right siphon is also heavily calcified with severe right cavernous segment stenosis (series 11, image 95). There is mild right supraclinoid stenosis. Normal right ophthalmic artery origin. Patent carotid termini. Normal MCA and ACA origins. The left A1 is dominant and mildly irregular. The anterior communicating artery appears partially fenestrated, normal variant. ACA A2 segments are within normal limits. There is some moderate irregularity and stenosis in the left A3 branches on series 14, image 22. Left MCA M1 is patent without stenosis. The left MCA bifurcation is patent with mild to moderate stenosis affecting the proximal dominant posterior M2 branch seen on series 12, image 25. Mild to moderate left MCA branch irregularity otherwise. Right MCA M1 and trifurcation are patent without stenosis. There are  moderate right MCA M3 branch irregularities and stenosis as seen on series 14, image 15. Venous sinuses: Patent on delayed images. Anatomic variants: Mildly dominant left A1. Delayed phase: No abnormal enhancement identified. Chronic small and medium-sized vessel ischemic changes redemonstrated. The small medial right PICA territory infarct seen by DWI is not evident. Review of the MIP images confirms the above findings IMPRESSION: 1. Appearance suspicious for a 2.2 cm Right Tonsil/Pharyngeal Mass. Recommend ENT follow-up for direct visualization. No pathologically enlarged nodes, but there is a heterogeneous right level 2 lymph node seen on series 11, image 65. 2. Negative for large vessel occlusion, but positive for widespread atherosclerosis with significant stenoses: - Severe stenoses Right vertebral artery origin and V1 segment. - Severe stenosis Left vertebral V4 segment. - Severe stenosis of the bilateral ICA siphon cavernous segments. - up to Moderate stenosis Right PCA P2 segment, Left MCA M2, and bilateral MCA M3 branches. - Moderate to Severe stenosis of the Right PCA inferior P3, Left ACA A3. 3. Stable CT appearance of the brain from earlier today. 4. Aortic Atherosclerosis (ICD10-I70.0) and Emphysema (ICD10-J43.9). Electronically Signed   By: Genevie Ann M.D.   On: 09/14/2018 06:54   Ct Head Wo Contrast  Result Date: 09/14/2018 CLINICAL DATA:  Vertigo, episodic, peripheral. Dizziness for 1 week. EXAM: CT HEAD WITHOUT CONTRAST TECHNIQUE: Contiguous axial images were obtained from the base of the skull through the vertex without intravenous contrast. COMPARISON:  Head CT and brain MRI 02/01/2007 FINDINGS: Brain: No acute hemorrhage. Multiple remote lacunar infarcts in the  pons, right cerebellum, and right basal ganglia. Remote infarct in the left occipital lobe. Generalized atrophy with microvascular ischemia. No evidence of acute ischemia. No hydrocephalus, mass effect, or midline shift. Vascular: Mild  generalized increased density of intracranial vasculature without hyperdense vessel. There is skull base atherosclerosis. Skull: No fracture or focal lesion. Sinuses/Orbits: Trace chronic debris in right side of sphenoid sinus. No sinus fluid level. The mastoid air cells are clear. The visualized orbits are unremarkable. Prior right cataract resection Other: None. IMPRESSION: 1. No acute intracranial abnormality. 2. Generalized atrophy and chronic ischemia. Multiple remote lacunar infarcts. Remote infarct in the left occipital lobe. Electronically Signed   By: Keith Rake M.D.   On: 09/14/2018 01:12   Ct Angio Neck W Or Wo Contrast  Result Date: 09/14/2018 CLINICAL DATA:  78 year old female with dizziness for 1 week and small acute infarct in the right cerebellum on MRI earlier today. EXAM: CT ANGIOGRAPHY HEAD AND NECK TECHNIQUE: Multidetector CT imaging of the head and neck was performed using the standard protocol during bolus administration of intravenous contrast. Multiplanar CT image reconstructions and MIPs were obtained to evaluate the vascular anatomy. Carotid stenosis measurements (when applicable) are obtained utilizing NASCET criteria, using the distal internal carotid diameter as the denominator. CONTRAST:  77mL ISOVUE-370 IOPAMIDOL (ISOVUE-370) INJECTION 76% COMPARISON:  Head CT and MRI earlier today. FINDINGS: CTA NECK Skeleton: Carious mandible dentition and absent maxillary dentition. Mild for age cervical spine degeneration. No acute osseous abnormality identified. Upper chest: Severe emphysema. No superior mediastinal lymphadenopathy. Other neck: Asymmetrically rounded and hyperenhancing right lateral pharyngeal wall/tonsillar pillar on series 7, image 98 and series 10, image 107 encompassing up to 22 millimeters. Superimposed postinflammatory appearing dystrophic calcifications. No pathologically enlarged lymph nodes, although a right level 2 lymph node is mildly heterogeneous on series  11, image 65. Other neck soft tissues appear grossly normal. Aortic arch: Calcified and soft aortic atherosclerosis. 3 vessel arch configuration. Right carotid system: No brachiocephalic artery or right CCA origin stenosis despite plaque. Mildly tortuous right CCA. Mild soft and calcified plaque at the bifurcation and right ICA bulb. Mild to moderate calcified plaque in the medial vessel just below the skull base, but no significant stenosis. Left carotid system: No left CCA origin stenosis despite soft and calcified plaque. Mildly tortuous vessel with continued atherosclerosis to the bifurcation, no significant stenosis. Plaque at the bifurcation and continuing into the left ICA bulb but no significant stenosis. Tortuous left ICA at the C1 level. Vertebral arteries: No proximal right subclavian artery stenosis. Calcified right vertebral artery origin and V1 segment with severe tandem stenoses. Minimal calcified right V2 plaque without stenosis. Intermittent right V3 calcified plaque without significant stenosis. The vessel remains patent to the skull base. No proximal left subclavian artery stenosis despite soft and calcified plaque. Normal left vertebral artery origin. Mild V1 segment plaque without stenosis. Intermittent V2 segment plaque without significant stenosis. Moderate left V3 segment plaque with up to 50% stenosis. The vessel remains patent to the skull base. CTA HEAD Posterior circulation: There is intermittent right V4 calcified plaque without hemodynamically significant stenosis. The right PICA origin is patent. The right vertebrobasilar junction is patent. There is more bulky left V4 segment calcified plaque with moderate to severe left V4 segment stenosis on series 9, image 160. But the vessel remains patent to the vertebrobasilar junction. The left AICA appears dominant and is patent. The basilar artery is patent with mild irregularity, no stenosis. Normal SCA and PCA origins. Posterior  communicating  arteries are diminutive or absent. Mild left and moderate right PCA P2 segment irregularity and stenosis. And there is severe stenosis of the right inferior P3 division on series 14, image 20 but preserved distal enhancement. Left PCA branches are within normal limits. Anterior circulation: Both ICA siphons are patent. The left siphon is heavily calcified with severe cavernous segment stenosis (series 9, image 135). There is mild left supraclinoid stenosis. Normal left ophthalmic artery origin. The right siphon is also heavily calcified with severe right cavernous segment stenosis (series 11, image 95). There is mild right supraclinoid stenosis. Normal right ophthalmic artery origin. Patent carotid termini. Normal MCA and ACA origins. The left A1 is dominant and mildly irregular. The anterior communicating artery appears partially fenestrated, normal variant. ACA A2 segments are within normal limits. There is some moderate irregularity and stenosis in the left A3 branches on series 14, image 22. Left MCA M1 is patent without stenosis. The left MCA bifurcation is patent with mild to moderate stenosis affecting the proximal dominant posterior M2 branch seen on series 12, image 25. Mild to moderate left MCA branch irregularity otherwise. Right MCA M1 and trifurcation are patent without stenosis. There are moderate right MCA M3 branch irregularities and stenosis as seen on series 14, image 15. Venous sinuses: Patent on delayed images. Anatomic variants: Mildly dominant left A1. Delayed phase: No abnormal enhancement identified. Chronic small and medium-sized vessel ischemic changes redemonstrated. The small medial right PICA territory infarct seen by DWI is not evident. Review of the MIP images confirms the above findings IMPRESSION: 1. Appearance suspicious for a 2.2 cm Right Tonsil/Pharyngeal Mass. Recommend ENT follow-up for direct visualization. No pathologically enlarged nodes, but there is a  heterogeneous right level 2 lymph node seen on series 11, image 65. 2. Negative for large vessel occlusion, but positive for widespread atherosclerosis with significant stenoses: - Severe stenoses Right vertebral artery origin and V1 segment. - Severe stenosis Left vertebral V4 segment. - Severe stenosis of the bilateral ICA siphon cavernous segments. - up to Moderate stenosis Right PCA P2 segment, Left MCA M2, and bilateral MCA M3 branches. - Moderate to Severe stenosis of the Right PCA inferior P3, Left ACA A3. 3. Stable CT appearance of the brain from earlier today. 4. Aortic Atherosclerosis (ICD10-I70.0) and Emphysema (ICD10-J43.9). Electronically Signed   By: Genevie Ann M.D.   On: 09/14/2018 06:54   Mr Brain Wo Contrast (neuro Protocol)  Result Date: 09/14/2018 CLINICAL DATA:  Vertigo, episodic, peripheral EXAM: MRI HEAD WITHOUT CONTRAST TECHNIQUE: Multiplanar, multiecho pulse sequences of the brain and surrounding structures were obtained without intravenous contrast. COMPARISON:  02/02/2007 FINDINGS: Brain: Subcentimeter acute right cerebellar infarct. Remote lacunar infarcts in the bilateral pons and left thalamus-the latter new from 2008. Remote perforator infarct in the right lateral lenticulostriate distribution and in the right cerebellum. Small remote left occipital cortex infarct that is new from 2008. No acute hemorrhage, hydrocephalus, collection, or masslike finding. Mild atrophy. Vascular: Major flow voids are preserved Skull and upper cervical spine: Negative for marrow lesion Sinuses/Orbits: Right cataract resection IMPRESSION: 1. Small acute infarct in the right cerebellum. 2. Remote small vessel infarcts as described, progressed from 2008. Electronically Signed   By: Monte Fantasia M.D.   On: 09/14/2018 04:04    PHYSICAL EXAM Frail elderly Caucasian lady not in distress. . Afebrile. Head is nontraumatic. Neck is supple without bruit.    Cardiac exam no murmur or gallop. Lungs are clear to  auscultation. Distal pulses are well felt. Neurological  Exam ;  Awake  Alert oriented x 3. Normal speech and language.eye movements full without nystagmus.fundi were not visualized. Vision acuity and fields appear normal. Hearing is normal. Palatal movements are normal. Face symmetric. Tongue midline. Normal strength, tone, reflexes and coordination. Normal sensation. Gait deferred.   ASSESSMENT/PLAN Tina Waller is a 78 y.o. female with history of hypertension, diabetes, stroke, gout and depression presenting with dizziness since last Sunday.   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  Generalized atrophy.  Chronic ischemia.  Multiple old lacunar infarcts.  Remote left occipital infarct.  MRI small right cerebellar infarct.  Small vessel disease.  CTA head & neck suspicious 2.2 cm right tonsil/pharyngeal mass with ENT follow-up recommended.  Heterogeneous right level 2 lymph node.  No LVO but has widespread atherosclerosis with significant stenosis (RVA origin, R V1, L V4, B ICA siphon cavernous, R P2, L M2, B M3) aortic atherosclerosis.  Emphysema.  2D Echo EF 60-65%. No clot  LDL 159  HgbA1c 5.6  Lovenox 40 mg sq daily for VTE prophylaxis  aspirin 81 mg daily prior to admission, now on aspirin 81 mg daily and clopidogrel 75 mg daily following load.  Continue dual antiplatelets for 3 months then Plavix alone.  Therapy recommendations: Pending  Disposition: Pending  Hypertensive urgency . Improving  . Permissive hypertension (OK if < 220/120) but gradually normalize in 5-7 days . Long-term BP goal normotensive  Hyperlipidemia  Home meds: No statin  LDL 159, goal < 70  Intolerant to statins in the past due to diarrhea  Other Stroke Risk Factors  Advanced age  Former cigarette smoker, quit 12 years ago   ETOH use, advised to drink no more than 1 drink(s) a day  Hx stroke/TIA  Other  Active Problems  Chronic kidney disease stage III  Anxiety  Right tonsil/pharyngeal mass  Hospital day # 1     She presented with dizziness and gait difficulties due to embolic right cerebellar infarct etiology to be determined. Continue ongoing stroke workup. CT angiogram shows severe right vertebral artery proximal stenosis which could be the source for stroke hence recommend dual antiplatelet therapy aspirin and Plavix for 3 months followed by aspirin alone. Discussed with patient and  answered questions. Greater than 50% time during this 25 minute visit was spent in counseling and coordination of care about her stroke and vertebral artery stenosis and answering questions.discussed with Dr. Renae Gloss catheter gait. Follow-up as an outpatient in the stroke clinic in 6 weeks. Stroke team will sign off.  Antony Contras, MD Medical Director [facility] Pager: [phone] 09/15/2018 1:53 PM  To contact Stroke Continuity provider, please refer to [email or web address] After hours, contact General Neurology

## 2018-09-15 NOTE — Care Management Note (Signed)
Case Management Note Manya Silvas, RN MSN CCM Transitions of Care 81M CM [phone] (cross coverage)  Patient Details  Name: Tina Waller MRN: [id number] Date of Birth: 03/19/1941  Subjective/Objective:      Hypertensive urgency, stroke             Action/Plan: Spoke with patient and daughter at the bedside. PTA patient home alone. Daughter is supportive and provides transportation. Patient states that she can drive, but doesn't like to drive. Discussed choice for HH-[facility] selected-referral accepted. DME-[facility], 3/1-[facility]. Gets prescriptions filled by mail order or if needed urgently uses [facility] on [address]. Patient to transition home today.   Expected Discharge Date:  09/15/18               Expected Discharge Plan:  [place]  In-House Referral:  NA  Discharge planning Services  CM Consult  Post Acute Care Choice:  Home Health, Durable Medical Equipment Choice offered to:  Patient, Adult Children  DME Arranged:  3-N-1, [place] rolling DME Agency:  [facility]  HH Arranged:  PT, OT HH Agency:  [facility]  Status of Service:  Completed, signed off  If discussed at [place] of Stay Meetings, dates discussed:    Additional Comments:  Bartholomew Crews, RN 09/15/2018, 2:38 PM

## 2018-09-15 NOTE — Discharge Summary (Signed)
Physician Discharge Summary  Tina Waller TML:[id number] DOB: 1940/12/15  PCP: [name], MD  Admit date: 09/13/2018 Discharge date: 09/15/2018  Recommendations for Outpatient Follow-up:  1. Dr. Karle Plumber, PCP in 1 week with repeat labs (CBC & BMP). 2. Dr. Leta Baptist, ENT in 3 days to evaluate for right tonsillar mass and possible biopsy. 3. Dr. Antony Contras, Neurology in 4 weeks.  Ambulatory referral sent. 4. Recommend repeating urine microscopy in 2 to 3 weeks and if microscopic hematuria persists then needs further evaluation.   Home Health: PT and OT Equipment/Devices: Rolling walker with 5 inch wheels and 3 n 1  Discharge Condition: Improved and stable. CODE STATUS: Full. Diet recommendation: Heart healthy diet.  Discharge Diagnoses:  Principal Problem:   Hypertensive urgency Active Problems:   CKD (chronic kidney disease) stage 3, GFR 30-59 ml/min (HCC)   Type 2 diabetes mellitus (HCC)   Anxiety   Acute CVA (cerebrovascular accident) [facility])   Brief Summary: 78 year old female, lives alone at home, ambulates with the help of a walker, PMH of anemia, anxiety, depression, cataracts, DM 2-diet controlled, gout, HTN, CVA presented with dizziness and was diagnosed with acute stroke.  Neurology consulted.  Assessment and plan:  1. Acute ischemic stroke: Resultant dizziness, improving.  Small right cerebellar infarct secondary to large vessel disease source.  CT head: No acute intracranial abnormality.  Generalized atrophy and chronic ischemia.  Multiple remote lacunar infarcts.  Remote infarct in the left occipital lobe.  MRI brain: Small acute infarct in the right cerebellum.  Remote small vessel infarcts as described, progressed from 2008.  CTA head and neck: Appearance suspicious for a 2.2 cm right tonsillar/pharyngeal mass for which ENT consultation was recommended.  Negative for large vessel occlusion but positive for widespread atherosclerosis with significant  stenosis as detailed in report below.  TTE: LVEF 60-65%.  LDL 159.  A1c 5.6.  Patient was on aspirin 81 mg daily prior to admission, now as per neurology recommendations >aspirin 81 mg daily and clopidogrel 75 mg daily for 3 months then clopidogrel alone.  PT and OT evaluated and recommend home health PT and OT.  I discussed with patient's daughter who indicates that they will be able to arrange 24/7 supervision for short-term until patient improves.  Improved and stable. 2. Essential hypertension/hypertensive urgency: Allow permissive hypertension due to recent acute stroke.  Thereby discontinued amlodipine but continue prior home dose of furosemide, losartan and Toprol-XL.  Close follow-up at PCPs office and determine if amlodipine needs to be reinitiated. 3. Hyperlipidemia: LDL 159, goal <70 but patient intolerant to statins in the past due to diarrhea and hence not started. 4. Right tonsil/pharyngeal mass: Dr. Waldron Labs discussed with Dr. Benjamine Mola, ENT who indicated that this will need a biopsy.  Patient has been provided with contact details for outpatient follow-up.  I discussed this in detail with patient and her daughter who verbalized understanding. 5. Stage III chronic kidney disease: Creatinine normal. 6. Diet-controlled DM2: A1c 5.6. 7. Anxiety: Stable. 8. Microscopic hematuria: Unclear etiology.  No UTI symptoms.  Urine microscopy shows 11-20 RBCs per high-power field.  Recommend repeating urine microscopy in 2 to 3 weeks and if this persists then will need further evaluation which may include urology consultation.   Consultations:  Neurology  Procedures:  None   Discharge Instructions  Discharge Instructions    Ambulatory referral to Neurology   Complete by:  As directed    An appointment is requested in approximately: 4 weeks   Call  MD for:   Complete by:  As directed    Recurrent strokelike symptoms.   Diet - low sodium heart healthy   Complete by:  As directed    Increase  activity slowly   Complete by:  As directed        Medication List    STOP taking these medications   amLODipine 5 MG tablet Commonly known as:  NORVASC     TAKE these medications   aspirin EC 81 MG tablet Take 81 mg by mouth daily.   clopidogrel 75 MG tablet Commonly known as:  PLAVIX Take 1 tablet (75 mg total) by mouth daily. Start taking on:  September 16, 2018   furosemide 40 MG tablet Commonly known as:  LASIX Take 1 tablet (40 mg total) by mouth daily.   losartan 100 MG tablet Commonly known as:  COZAAR Take 1 tablet (100 mg total) by mouth daily.   metoprolol succinate 50 MG 24 hr tablet Commonly known as:  TOPROL-XL Take 1 tablet (50 mg total) by mouth daily. Take with or immediately following a meal.   metoprolol succinate 25 MG 24 hr tablet Commonly known as:  TOPROL-XL Take 1 tab PO daily with a 50 mg tablet for a total of 75 mg daily   polyethylene glycol packet Commonly known as:  MIRALAX Take 17 g by mouth daily as needed for mild constipation.   potassium chloride 8 MEQ tablet Commonly known as:  KLOR-CON Take 2 tablets (16 mEq total) by mouth daily.   triamcinolone cream 0.1 % Commonly known as:  KENALOG Apply 1 application topically 2 (two) times daily as needed (rash).   VITAMIN B 12 PO Take 1 tablet by mouth daily.   VITAMIN D PO Take 1 tablet by mouth daily.      Follow-up Information    Leta Baptist, MD Follow up in 3 day(s).   Specialty:  Otolaryngology Why:  for right tonsill mass biopsy Contact information: [address] [place] [address] [phone]        [name], MD. Schedule an appointment as soon as possible for a visit in 1 week(s).   Specialty:  Internal Medicine Why:  To be seen with repeat labs (CBC & BMP). Contact information: [place] [address] [phone]        Garvin Fila, MD. Schedule an appointment as soon as possible for a visit in 4 week(s).   Specialties:   Neurology, Radiology Contact information: [address] [place] [address] [phone]          Allergies  Allergen Reactions  . Lipitor [Atorvastatin] Diarrhea  . Lisinopril Cough      Procedures/Studies: Ct Angio Head W Or Wo Contrast  Result Date: 09/14/2018 CLINICAL DATA:  78 year old female with dizziness for 1 week and small acute infarct in the right cerebellum on MRI earlier today. EXAM: CT ANGIOGRAPHY HEAD AND NECK TECHNIQUE: Multidetector CT imaging of the head and neck was performed using the standard protocol during bolus administration of intravenous contrast. Multiplanar CT image reconstructions and MIPs were obtained to evaluate the vascular anatomy. Carotid stenosis measurements (when applicable) are obtained utilizing NASCET criteria, using the distal internal carotid diameter as the denominator. CONTRAST:  69mL ISOVUE-370 IOPAMIDOL (ISOVUE-370) INJECTION 76% COMPARISON:  Head CT and MRI earlier today. FINDINGS: CTA NECK Skeleton: Carious mandible dentition and absent maxillary dentition. Mild for age cervical spine degeneration. No acute osseous abnormality identified. Upper  chest: Severe emphysema. No superior mediastinal lymphadenopathy. Other neck: Asymmetrically rounded and hyperenhancing right lateral pharyngeal wall/tonsillar pillar on series 7, image 98 and series 10, image 107 encompassing up to 22 millimeters. Superimposed postinflammatory appearing dystrophic calcifications. No pathologically enlarged lymph nodes, although a right level 2 lymph node is mildly heterogeneous on series 11, image 65. Other neck soft tissues appear grossly normal. Aortic arch: Calcified and soft aortic atherosclerosis. 3 vessel arch configuration. Right carotid system: No brachiocephalic artery or right CCA origin stenosis despite plaque. Mildly tortuous right CCA. Mild soft and calcified plaque at the bifurcation and right ICA bulb. Mild to moderate calcified plaque in  the medial vessel just below the skull base, but no significant stenosis. Left carotid system: No left CCA origin stenosis despite soft and calcified plaque. Mildly tortuous vessel with continued atherosclerosis to the bifurcation, no significant stenosis. Plaque at the bifurcation and continuing into the left ICA bulb but no significant stenosis. Tortuous left ICA at the C1 level. Vertebral arteries: No proximal right subclavian artery stenosis. Calcified right vertebral artery origin and V1 segment with severe tandem stenoses. Minimal calcified right V2 plaque without stenosis. Intermittent right V3 calcified plaque without significant stenosis. The vessel remains patent to the skull base. No proximal left subclavian artery stenosis despite soft and calcified plaque. Normal left vertebral artery origin. Mild V1 segment plaque without stenosis. Intermittent V2 segment plaque without significant stenosis. Moderate left V3 segment plaque with up to 50% stenosis. The vessel remains patent to the skull base. CTA HEAD Posterior circulation: There is intermittent right V4 calcified plaque without hemodynamically significant stenosis. The right PICA origin is patent. The right vertebrobasilar junction is patent. There is more bulky left V4 segment calcified plaque with moderate to severe left V4 segment stenosis on series 9, image 160. But the vessel remains patent to the vertebrobasilar junction. The left AICA appears dominant and is patent. The basilar artery is patent with mild irregularity, no stenosis. Normal SCA and PCA origins. Posterior communicating arteries are diminutive or absent. Mild left and moderate right PCA P2 segment irregularity and stenosis. And there is severe stenosis of the right inferior P3 division on series 14, image 20 but preserved distal enhancement. Left PCA branches are within normal limits. Anterior circulation: Both ICA siphons are patent. The left siphon is heavily calcified with severe  cavernous segment stenosis (series 9, image 135). There is mild left supraclinoid stenosis. Normal left ophthalmic artery origin. The right siphon is also heavily calcified with severe right cavernous segment stenosis (series 11, image 95). There is mild right supraclinoid stenosis. Normal right ophthalmic artery origin. Patent carotid termini. Normal MCA and ACA origins. The left A1 is dominant and mildly irregular. The anterior communicating artery appears partially fenestrated, normal variant. ACA A2 segments are within normal limits. There is some moderate irregularity and stenosis in the left A3 branches on series 14, image 22. Left MCA M1 is patent without stenosis. The left MCA bifurcation is patent with mild to moderate stenosis affecting the proximal dominant posterior M2 branch seen on series 12, image 25. Mild to moderate left MCA branch irregularity otherwise. Right MCA M1 and trifurcation are patent without stenosis. There are moderate right MCA M3 branch irregularities and stenosis as seen on series 14, image 15. Venous sinuses: Patent on delayed images. Anatomic variants: Mildly dominant left A1. Delayed phase: No abnormal enhancement identified. Chronic small and medium-sized vessel ischemic changes redemonstrated. The small medial right PICA territory infarct seen by DWI is  not evident. Review of the MIP images confirms the above findings IMPRESSION: 1. Appearance suspicious for a 2.2 cm Right Tonsil/Pharyngeal Mass. Recommend ENT follow-up for direct visualization. No pathologically enlarged nodes, but there is a heterogeneous right level 2 lymph node seen on series 11, image 65. 2. Negative for large vessel occlusion, but positive for widespread atherosclerosis with significant stenoses: - Severe stenoses Right vertebral artery origin and V1 segment. - Severe stenosis Left vertebral V4 segment. - Severe stenosis of the bilateral ICA siphon cavernous segments. - up to Moderate stenosis Right PCA P2  segment, Left MCA M2, and bilateral MCA M3 branches. - Moderate to Severe stenosis of the Right PCA inferior P3, Left ACA A3. 3. Stable CT appearance of the brain from earlier today. 4. Aortic Atherosclerosis (ICD10-I70.0) and Emphysema (ICD10-J43.9). Electronically Signed   By: Genevie Ann M.D.   On: 09/14/2018 06:54   Ct Head Wo Contrast  Result Date: 09/14/2018 CLINICAL DATA:  Vertigo, episodic, peripheral. Dizziness for 1 week. EXAM: CT HEAD WITHOUT CONTRAST TECHNIQUE: Contiguous axial images were obtained from the base of the skull through the vertex without intravenous contrast. COMPARISON:  Head CT and brain MRI 02/01/2007 FINDINGS: Brain: No acute hemorrhage. Multiple remote lacunar infarcts in the pons, right cerebellum, and right basal ganglia. Remote infarct in the left occipital lobe. Generalized atrophy with microvascular ischemia. No evidence of acute ischemia. No hydrocephalus, mass effect, or midline shift. Vascular: Mild generalized increased density of intracranial vasculature without hyperdense vessel. There is skull base atherosclerosis. Skull: No fracture or focal lesion. Sinuses/Orbits: Trace chronic debris in right side of sphenoid sinus. No sinus fluid level. The mastoid air cells are clear. The visualized orbits are unremarkable. Prior right cataract resection Other: None. IMPRESSION: 1. No acute intracranial abnormality. 2. Generalized atrophy and chronic ischemia. Multiple remote lacunar infarcts. Remote infarct in the left occipital lobe. Electronically Signed   By: Keith Rake M.D.   On: 09/14/2018 01:12   Ct Angio Neck W Or Wo Contrast  Result Date: 09/14/2018 CLINICAL DATA:  78 year old female with dizziness for 1 week and small acute infarct in the right cerebellum on MRI earlier today. EXAM: CT ANGIOGRAPHY HEAD AND NECK TECHNIQUE: Multidetector CT imaging of the head and neck was performed using the standard protocol during bolus administration of intravenous contrast.  Multiplanar CT image reconstructions and MIPs were obtained to evaluate the vascular anatomy. Carotid stenosis measurements (when applicable) are obtained utilizing NASCET criteria, using the distal internal carotid diameter as the denominator. CONTRAST:  41mL ISOVUE-370 IOPAMIDOL (ISOVUE-370) INJECTION 76% COMPARISON:  Head CT and MRI earlier today. FINDINGS: CTA NECK Skeleton: Carious mandible dentition and absent maxillary dentition. Mild for age cervical spine degeneration. No acute osseous abnormality identified. Upper chest: Severe emphysema. No superior mediastinal lymphadenopathy. Other neck: Asymmetrically rounded and hyperenhancing right lateral pharyngeal wall/tonsillar pillar on series 7, image 98 and series 10, image 107 encompassing up to 22 millimeters. Superimposed postinflammatory appearing dystrophic calcifications. No pathologically enlarged lymph nodes, although a right level 2 lymph node is mildly heterogeneous on series 11, image 65. Other neck soft tissues appear grossly normal. Aortic arch: Calcified and soft aortic atherosclerosis. 3 vessel arch configuration. Right carotid system: No brachiocephalic artery or right CCA origin stenosis despite plaque. Mildly tortuous right CCA. Mild soft and calcified plaque at the bifurcation and right ICA bulb. Mild to moderate calcified plaque in the medial vessel just below the skull base, but no significant stenosis. Left carotid system: No left CCA origin stenosis  despite soft and calcified plaque. Mildly tortuous vessel with continued atherosclerosis to the bifurcation, no significant stenosis. Plaque at the bifurcation and continuing into the left ICA bulb but no significant stenosis. Tortuous left ICA at the C1 level. Vertebral arteries: No proximal right subclavian artery stenosis. Calcified right vertebral artery origin and V1 segment with severe tandem stenoses. Minimal calcified right V2 plaque without stenosis. Intermittent right V3 calcified  plaque without significant stenosis. The vessel remains patent to the skull base. No proximal left subclavian artery stenosis despite soft and calcified plaque. Normal left vertebral artery origin. Mild V1 segment plaque without stenosis. Intermittent V2 segment plaque without significant stenosis. Moderate left V3 segment plaque with up to 50% stenosis. The vessel remains patent to the skull base. CTA HEAD Posterior circulation: There is intermittent right V4 calcified plaque without hemodynamically significant stenosis. The right PICA origin is patent. The right vertebrobasilar junction is patent. There is more bulky left V4 segment calcified plaque with moderate to severe left V4 segment stenosis on series 9, image 160. But the vessel remains patent to the vertebrobasilar junction. The left AICA appears dominant and is patent. The basilar artery is patent with mild irregularity, no stenosis. Normal SCA and PCA origins. Posterior communicating arteries are diminutive or absent. Mild left and moderate right PCA P2 segment irregularity and stenosis. And there is severe stenosis of the right inferior P3 division on series 14, image 20 but preserved distal enhancement. Left PCA branches are within normal limits. Anterior circulation: Both ICA siphons are patent. The left siphon is heavily calcified with severe cavernous segment stenosis (series 9, image 135). There is mild left supraclinoid stenosis. Normal left ophthalmic artery origin. The right siphon is also heavily calcified with severe right cavernous segment stenosis (series 11, image 95). There is mild right supraclinoid stenosis. Normal right ophthalmic artery origin. Patent carotid termini. Normal MCA and ACA origins. The left A1 is dominant and mildly irregular. The anterior communicating artery appears partially fenestrated, normal variant. ACA A2 segments are within normal limits. There is some moderate irregularity and stenosis in the left A3 branches on  series 14, image 22. Left MCA M1 is patent without stenosis. The left MCA bifurcation is patent with mild to moderate stenosis affecting the proximal dominant posterior M2 branch seen on series 12, image 25. Mild to moderate left MCA branch irregularity otherwise. Right MCA M1 and trifurcation are patent without stenosis. There are moderate right MCA M3 branch irregularities and stenosis as seen on series 14, image 15. Venous sinuses: Patent on delayed images. Anatomic variants: Mildly dominant left A1. Delayed phase: No abnormal enhancement identified. Chronic small and medium-sized vessel ischemic changes redemonstrated. The small medial right PICA territory infarct seen by DWI is not evident. Review of the MIP images confirms the above findings IMPRESSION: 1. Appearance suspicious for a 2.2 cm Right Tonsil/Pharyngeal Mass. Recommend ENT follow-up for direct visualization. No pathologically enlarged nodes, but there is a heterogeneous right level 2 lymph node seen on series 11, image 65. 2. Negative for large vessel occlusion, but positive for widespread atherosclerosis with significant stenoses: - Severe stenoses Right vertebral artery origin and V1 segment. - Severe stenosis Left vertebral V4 segment. - Severe stenosis of the bilateral ICA siphon cavernous segments. - up to Moderate stenosis Right PCA P2 segment, Left MCA M2, and bilateral MCA M3 branches. - Moderate to Severe stenosis of the Right PCA inferior P3, Left ACA A3. 3. Stable CT appearance of the brain from earlier today. 4.  Aortic Atherosclerosis (ICD10-I70.0) and Emphysema (ICD10-J43.9). Electronically Signed   By: Genevie Ann M.D.   On: 09/14/2018 06:54   Mr Brain Wo Contrast (neuro Protocol)  Result Date: 09/14/2018 CLINICAL DATA:  Vertigo, episodic, peripheral EXAM: MRI HEAD WITHOUT CONTRAST TECHNIQUE: Multiplanar, multiecho pulse sequences of the brain and surrounding structures were obtained without intravenous contrast. COMPARISON:  02/02/2007  FINDINGS: Brain: Subcentimeter acute right cerebellar infarct. Remote lacunar infarcts in the bilateral pons and left thalamus-the latter new from 2008. Remote perforator infarct in the right lateral lenticulostriate distribution and in the right cerebellum. Small remote left occipital cortex infarct that is new from 2008. No acute hemorrhage, hydrocephalus, collection, or masslike finding. Mild atrophy. Vascular: Major flow voids are preserved Skull and upper cervical spine: Negative for marrow lesion Sinuses/Orbits: Right cataract resection IMPRESSION: 1. Small acute infarct in the right cerebellum. 2. Remote small vessel infarcts as described, progressed from 2008. Electronically Signed   By: Monte Fantasia M.D.   On: 09/14/2018 04:04     Subjective: Patient felt slightly dizzy this morning but that had resolved when PT worked with her.  Denies any other complaints or strokelike symptoms.  Discharge Exam:  Vitals:   09/15/18 0000 09/15/18 0415 09/15/18 0730 09/15/18 1120  BP: (!) 182/86 (!) 184/82 136/69 (!) 172/79  Pulse: 82 80 84 82  Resp: 18 18 18 16   Temp: 98.3 F (36.8 C) 98.6 F (37 C) 98.5 F (36.9 C) 98.2 F (36.8 C)  TempSrc: Oral Oral Oral Oral  SpO2: 97% 99% 98% 95%    General: Pleasant elderly female, moderately built and nourished, sitting up comfortably in chair this morning. Cardiovascular: S1 & S2 heard, RRR, S1/S2 +. No murmurs, rubs, gallops or clicks. No JVD or pedal edema.  Telemetry personally reviewed: Sinus rhythm. Respiratory: Clear to auscultation without wheezing, rhonchi or crackles. No increased work of breathing. Abdominal:  Non distended, non tender & soft. No organomegaly or masses appreciated. Normal bowel sounds heard. CNS: Alert and oriented. No focal deficits. Extremities: no edema, no cyanosis    The results of significant diagnostics from this hospitalization (including imaging, microbiology, ancillary and laboratory) are listed below for  reference.      Labs: CBC: Recent Labs  Lab 09/13/18 1946 09/15/18 0520  WBC 7.1 5.0  HGB 12.8 12.2  HCT 38.3 36.4  MCV 95.8 93.6  PLT 275 967   Basic Metabolic Panel: Recent Labs  Lab 09/13/18 1946  NA 137  K 3.7  CL 104  CO2 24  GLUCOSE 125*  BUN 18  CREATININE 1.09*  CALCIUM 9.4  MG 1.8  PHOS 3.0    Cardiac Enzymes: Recent Labs  Lab 09/14/18 0847 09/14/18 1307  TROPONINI <0.03 <0.03   CBG: Recent Labs  Lab 09/14/18 1150 09/14/18 1612 09/14/18 2103 09/15/18 0813 09/15/18 1122  GLUCAP 86 123* 81 80 99   Hgb A1c Recent Labs    09/14/18 0511  HGBA1C 5.6   Lipid Profile Recent Labs    09/14/18 0511  CHOL 215*  HDL 41  LDLCALC 159*  TRIG 76  CHOLHDL 5.2   Urinalysis    Component Value Date/Time   COLORURINE YELLOW 09/13/2018 1944   APPEARANCEUR HAZY (A) 09/13/2018 1944   APPEARANCEUR Cloudy (A) 11/13/2016 1425   LABSPEC 1.009 09/13/2018 1944   PHURINE 7.0 09/13/2018 1944   GLUCOSEU NEGATIVE 09/13/2018 1944   HGBUR MODERATE (A) 09/13/2018 1944   BILIRUBINUR NEGATIVE 09/13/2018 1944   BILIRUBINUR Negative 11/13/2016 [place]  NEGATIVE 09/13/2018 1944   PROTEINUR 100 (A) 09/13/2018 1944   UROBILINOGEN 0.2 02/01/2007 2102   NITRITE NEGATIVE 09/13/2018 1944   LEUKOCYTESUR NEGATIVE 09/13/2018 1944    I discussed in detail with patient's daughter, updated care and answered all questions.  Time coordinating discharge: 40 minutes  SIGNED:  Vernell Leep, MD, FACP, [facility]. [facility]  To contact the attending provider between 7A-7P or the covering provider during after hours 7P-7A, please log into the web site [email or web address] and access using universal [place] password for that web site. If you do not have the password, please call the hospital operator.

## 2018-09-15 NOTE — Evaluation (Signed)
Occupational Therapy Evaluation Patient Details Name: Tina Waller MRN: [id number] DOB: [birth year] Today's Date: 09/15/2018    History of Present Illness 78 y.o. female with history of hypertension, diabetes, stroke, gout and depression presenting with dizziness since last Sunday (09/12/18). CT head no acute abnormality, Multiple old lacunar infarcts and Remote left occipital infarct. MRI revealed small right cerebellar infarct.     Clinical Impression   This 78 y/o female presents with the above. At baseline pt is mod independent with ADL, iADL and functional mobility reports intermittent use of SPC; pt reports she lives alone and her daughter checks in weekly. Pt completing room level functional mobility initially without AD and close minguard assist; pt noted to seek out UE support and reports she feels increased unsteadiness compared to her baseline; trialed use of RW in with pt reporting feeling increased stability given bil UE support. Pt completing standing grooming and LB ADL with minA overall. She reports history of dizziness at home but denies symptoms during this session. Pt cognition overall appears [facility] for basic tasks but will continue to assess for higher level deficits. Pt will benefit from continued OT services in both acute and post acute settings to maximize her safety and independence with ADL and mobility. Will follow.     Follow Up Recommendations  Home health OT;Supervision/Assistance - 24 hour(24hr initially; may be good candidate for HomeFirst program )(if eligible)     Equipment Recommendations  3 in 1 bedside commode           Precautions / Restrictions Precautions Precautions: Fall Restrictions Weight Bearing Restrictions: No      Mobility Bed Mobility Overal bed mobility: Modified Independent             General bed mobility comments: no assist required, increased time and HOB slightly elevated  Transfers Overall transfer level: Needs  assistance Equipment used: None Transfers: Sit to/from Stand Sit to Stand: Min guard         General transfer comment: increased time/effort though no physical assist required    Balance Overall balance assessment: Needs assistance Sitting-balance support: Feet supported Sitting balance-Leahy Scale: Good Sitting balance - Comments: donning socks seated EOB   Standing balance support: No upper extremity supported;Bilateral upper extremity supported;During functional activity Standing balance-Leahy Scale: Fair Standing balance comment: able to maintain static standing with minguard assist, pt often seeking UE support with mobility or dynamic tasks                           ADL either performed or assessed with clinical judgement   ADL Overall ADL's : Needs assistance/impaired Eating/Feeding: Modified independent;Sitting   Grooming: Wash/dry face;Oral care;Min guard;Minimal assistance;Standing Grooming Details (indicate cue type and reason): minA initially for standing balance Upper Body Bathing: Min guard;Set up;Sitting   Lower Body Bathing: Min guard;Sit to/from stand   Upper Body Dressing : Set up;Min guard;Sitting   Lower Body Dressing: Minimal assistance;Sit to/from stand Lower Body Dressing Details (indicate cue type and reason): pt is able to don socks seated EOB without assist, minguard for standing balance though requires intermittent minA for dynamic standing  Toilet Transfer: Min guard;Minimal assistance;Ambulation;RW Toilet Transfer Details (indicate cue type and reason): mobility in room with and without RW as pt reports she feels more unsteady without UE support Toileting- Clothing Manipulation and Hygiene: Minimal assistance;Sit to/from stand       Functional mobility during ADLs: Min guard;Minimal assistance;Rolling walker General ADL Comments: pt  with mild unsteadiness on her feet, denies dizziness this session but does report h/o dizziness      Vision Baseline Vision/History: Wears glasses Wears Glasses: Reading only       Perception     Praxis      Pertinent Vitals/Pain Pain Assessment: Faces Faces Pain Scale: Hurts a little bit Pain Location: indigestion  Pain Descriptors / Indicators: Discomfort Pain Intervention(s): Limited activity within patient's tolerance;Monitored during session     Hand Dominance     Extremity/Trunk Assessment Upper Extremity Assessment Upper Extremity Assessment: Generalized weakness   Lower Extremity Assessment Lower Extremity Assessment: Defer to PT evaluation       Communication Communication Communication: No difficulties   Cognition Arousal/Alertness: Awake/alert Behavior During Therapy: WFL for tasks assessed/performed Overall Cognitive Status: Within Functional Limits for tasks assessed                                 General Comments: slight delay in processing/memory but overall appears [facility] for basic functional tasks; requires min safety cues for using RW as pt has not used before, will continue to assess   General Comments       Exercises     Shoulder Instructions      Home Living Family/patient expects to be discharged to:: Private residence Living Arrangements: Alone Available Help at Discharge: Neighbor;Family;Available PRN/intermittently Type of Home: Apartment Home Access: Stairs to enter Entrance Stairs-Number of Steps: 1   Home Layout: One level     Bathroom Shower/Tub: Tub/shower unit;Walk-in shower(pt uses walk-in shower)   Bathroom Toilet: Standard     Home Equipment: Cane - single point;Shower seat   Additional Comments: pt reports daughter checks in 1x/week  Lives With: Alone    Prior Functioning/Environment Level of Independence: Independent with assistive device(s)        Comments: occasional use of SPC; reports she is still driving, grocery shopping        OT Problem List: Decreased range of motion;Decreased  strength;Decreased activity tolerance;Impaired balance (sitting and/or standing);Decreased knowledge of use of DME or AE      OT Treatment/Interventions: Self-care/ADL training;Therapeutic exercise;Neuromuscular education;Therapeutic activities;Patient/family education;Balance training;Cognitive remediation/compensation;DME and/or AE instruction    OT Goals(Current goals can be found in the care plan section) Acute Rehab OT Goals Patient Stated Goal: to improve her balance OT Goal Formulation: With patient Time For Goal Achievement: 09/29/18 Potential to Achieve Goals: Good  OT Frequency: Min 2X/week   Barriers to D/C:            Co-evaluation              AM-PAC OT "6 Clicks" Daily Activity     Outcome Measure Help from another person eating meals?: None Help from another person taking care of personal grooming?: A Little(in standing) Help from another person toileting, which includes using toliet, bedpan, or urinal?: A Little Help from another person bathing (including washing, rinsing, drying)?: A Little Help from another person to put on and taking off regular upper body clothing?: None Help from another person to put on and taking off regular lower body clothing?: A Little 6 Click Score: 20   End of Session Equipment Utilized During Treatment: Gait belt;Rolling walker Nurse Communication: Mobility status  Activity Tolerance: Patient tolerated treatment well Patient left: in chair;with call bell/phone within reach;with chair alarm set  OT Visit Diagnosis: Unsteadiness on feet (R26.81);Muscle weakness (generalized) (M62.81)  Time: 9678-9381 OT Time Calculation (min): 20 min Charges:  OT General Charges $OT Visit: 1 Visit OT Evaluation $OT Eval Moderate Complexity: 1 Mod  Lou Cal, OT [employer] Pager [phone] Office [phone]   Raymondo Band 09/15/2018, 9:53 AM

## 2018-09-15 NOTE — Progress Notes (Addendum)
Occupational Therapy Treatment Patient Details Name: Tina Waller MRN: [id number] DOB: [birth year] Today's Date: 09/15/2018    History of present illness Pt is a 78 y.o. female with history of hypertension, diabetes, stroke, gout and depression presenting with dizziness since last Sunday (09/12/18). CT head no acute abnormality, Multiple old lacunar infarcts and Remote left occipital infarct. MRI revealed small right cerebellar infarct.     OT comments  Pt seen for additional treatment session for administration of pill box task. Pt overall performing with min difficulty, requiring approx 10 min to complete, initially with 12 errors though given cues pt able to self correct and with 6 errors end of task (see full detail below). Discussed strategies/compensatory techniques for performing iADL tasks such as medication management after return home and pt verbalizing understanding. Recommend pt have 24hr supervision initially to ensure adequate safety with ADL/iADL tasks at home. Will continue to follow acutely to progress pt towards established OT goals.     Follow Up Recommendations  Home health OT;Supervision/Assistance - 24 hour(24hr initially)    Equipment Recommendations  3 in 1 bedside commode          Precautions / Restrictions Precautions Precautions: Fall Restrictions Weight Bearing Restrictions: No       Mobility Bed Mobility               General bed mobility comments: pt OOB in recliner chair upon arrival  Transfers Overall transfer level: Needs assistance Equipment used: Rolling walker (2 wheeled) Transfers: Sit to/from Stand Sit to Stand: Min guard         General transfer comment: increased time and effort, good technique, min guard for safety    Balance Overall balance assessment: Needs assistance Sitting-balance support: Feet supported Sitting balance-Leahy Scale: Good     Standing balance support: No upper extremity supported Standing  balance-Leahy Scale: Fair                             ADL either performed or assessed with clinical judgement   ADL Overall ADL's : Needs assistance/impaired                                       General ADL Comments: pt seen for additional therapy session to address higher level cognition. Pt participated in pill box test; pt required increased time (approx 10 min) to perform, initially required increased cues/education to perform task due to novelty of using medication/pill box, once completed pt with 12 errors (forgot to fully complete distributing pills throughout entire week, only completing for one day), given min cues pt able to correct and with 6 errors end of task (placing 6 "dinner time pills" in "bedtime" slot). discussed methods for performing iADL tasks such as medication management at home including use of medication box and pt verbalizing understanding/agreement     Vision       Perception     Praxis      Cognition Arousal/Alertness: Awake/alert Behavior During Therapy: WFL for tasks assessed/performed Overall Cognitive Status: Within Functional Limits for tasks assessed                                          Exercises     Shoulder Instructions  General Comments      Pertinent Vitals/ Pain       Pain Assessment: Faces Faces Pain Scale: No hurt Pain Intervention(s): Monitored during session  Home Living Family/patient expects to be discharged to:: Private residence Living Arrangements: Alone Available Help at Discharge: Neighbor;Family;Available PRN/intermittently Type of Home: Apartment Home Access: Stairs to enter Entrance Stairs-Number of Steps: 1   Home Layout: One level     Bathroom Shower/Tub: Tub/shower unit;Walk-in shower(pt uses walk-in shower)   Bathroom Toilet: Standard     Home Equipment: Cane - single point;Shower seat   Additional Comments: pt reports daughter checks in 1x/week   Lives With: Alone    Prior Functioning/Environment Level of Independence: Independent with assistive device(s)        Comments: occasional use of SPC; reports she is still driving, grocery shopping   Frequency  Min 2X/week        Progress Toward Goals  OT Goals(current goals can now be found in the care plan section)  Progress towards OT goals: Progressing toward goals  Acute Rehab OT Goals Patient Stated Goal: to improve her balance OT Goal Formulation: With patient Time For Goal Achievement: 09/29/18 Potential to Achieve Goals: Good  Plan Discharge plan remains appropriate    Co-evaluation                 AM-PAC OT "6 Clicks" Daily Activity     Outcome Measure   Help from another person eating meals?: None Help from another person taking care of personal grooming?: A Little Help from another person toileting, which includes using toliet, bedpan, or urinal?: A Little Help from another person bathing (including washing, rinsing, drying)?: A Little Help from another person to put on and taking off regular upper body clothing?: None Help from another person to put on and taking off regular lower body clothing?: A Little 6 Click Score: 20    End of Session    OT Visit Diagnosis: Unsteadiness on feet (R26.81);Muscle weakness (generalized) (M62.81)   Activity Tolerance Patient tolerated treatment well   Patient Left in chair;with call bell/phone within reach;with chair alarm set   Nurse Communication Mobility status        Time: 6226-3335 OT Time Calculation (min): 21 min  Charges: OT General Charges $OT Visit: 1 Visit OT Treatments $Therapeutic Activity: 8-22 mins  Lou Cal, OT [facility] Pager [phone] Office [place] 09/15/2018, 1:29 PM

## 2018-09-16 ENCOUNTER — Telehealth: Payer: Self-pay | Admitting: Internal Medicine

## 2018-09-16 DIAGNOSIS — N183 Chronic kidney disease, stage 3 (moderate): Secondary | ICD-10-CM | POA: Diagnosis not present

## 2018-09-16 DIAGNOSIS — I69351 Hemiplegia and hemiparesis following cerebral infarction affecting right dominant side: Secondary | ICD-10-CM | POA: Diagnosis not present

## 2018-09-16 DIAGNOSIS — I129 Hypertensive chronic kidney disease with stage 1 through stage 4 chronic kidney disease, or unspecified chronic kidney disease: Secondary | ICD-10-CM | POA: Diagnosis not present

## 2018-09-16 DIAGNOSIS — J392 Other diseases of pharynx: Secondary | ICD-10-CM | POA: Diagnosis not present

## 2018-09-16 DIAGNOSIS — E1122 Type 2 diabetes mellitus with diabetic chronic kidney disease: Secondary | ICD-10-CM | POA: Diagnosis not present

## 2018-09-16 DIAGNOSIS — R42 Dizziness and giddiness: Secondary | ICD-10-CM | POA: Diagnosis not present

## 2018-09-16 NOTE — Telephone Encounter (Signed)
Returned [place] from [facility] to give verbal orders   FYI

## 2018-09-16 NOTE — Telephone Encounter (Signed)
Patient needs pt 1 week 1  2week 3  1 week 1  With [place] home health

## 2018-09-17 DIAGNOSIS — I69351 Hemiplegia and hemiparesis following cerebral infarction affecting right dominant side: Secondary | ICD-10-CM | POA: Diagnosis not present

## 2018-09-17 DIAGNOSIS — R42 Dizziness and giddiness: Secondary | ICD-10-CM | POA: Diagnosis not present

## 2018-09-17 DIAGNOSIS — I129 Hypertensive chronic kidney disease with stage 1 through stage 4 chronic kidney disease, or unspecified chronic kidney disease: Secondary | ICD-10-CM | POA: Diagnosis not present

## 2018-09-17 DIAGNOSIS — E1122 Type 2 diabetes mellitus with diabetic chronic kidney disease: Secondary | ICD-10-CM | POA: Diagnosis not present

## 2018-09-17 DIAGNOSIS — N183 Chronic kidney disease, stage 3 (moderate): Secondary | ICD-10-CM | POA: Diagnosis not present

## 2018-09-17 DIAGNOSIS — J392 Other diseases of pharynx: Secondary | ICD-10-CM | POA: Diagnosis not present

## 2018-09-20 ENCOUNTER — Other Ambulatory Visit: Payer: Self-pay | Admitting: *Deleted

## 2018-09-20 DIAGNOSIS — E1122 Type 2 diabetes mellitus with diabetic chronic kidney disease: Secondary | ICD-10-CM | POA: Diagnosis not present

## 2018-09-20 DIAGNOSIS — J392 Other diseases of pharynx: Secondary | ICD-10-CM | POA: Diagnosis not present

## 2018-09-20 DIAGNOSIS — I69351 Hemiplegia and hemiparesis following cerebral infarction affecting right dominant side: Secondary | ICD-10-CM | POA: Diagnosis not present

## 2018-09-20 DIAGNOSIS — I129 Hypertensive chronic kidney disease with stage 1 through stage 4 chronic kidney disease, or unspecified chronic kidney disease: Secondary | ICD-10-CM | POA: Diagnosis not present

## 2018-09-20 DIAGNOSIS — N183 Chronic kidney disease, stage 3 (moderate): Secondary | ICD-10-CM | POA: Diagnosis not present

## 2018-09-20 DIAGNOSIS — R42 Dizziness and giddiness: Secondary | ICD-10-CM | POA: Diagnosis not present

## 2018-09-20 NOTE — Patient Outreach (Signed)
[facility]) Care Management  09/20/2018  HIRAL LUKASIEWICZ 1940-12-15 [id number]   Transition of Care Referral   Referral Date: 09/20/18  Referral Source:  [facility] inpatient discharge Date of Admission: 09/13/18 Diagnosis: dizziness and giddiness Date of Discharge:Facility: [facility] on 09/15/18. Insurance: [employer] medicare  And   EMMI- stroke  RED ON [place] Common Day # 1 Date: 09/17/18 1008 Friday  Red Alert Reason: Scheduled a follow-up appointment? No   Outreach attempt # 1 successful to her home number Patient is able to verify HIPAA Reviewed and addressed Transitional of care and EMMI red alert referrals with patient  EMMI She confirms the answer was correct on 09/17/18 but as of today 09/20/18 she has an appointment to see Dr Wynetta Emery on 09/24/18   Mrs Verner states she still has some dizziness and she discussed it with the [facility] PT today She has been checking her BP 177/80 when she checked and 140/? - 160/? When checked by the [facility] PT per pt  CM discussed hypotension and hypertension. Cm discussed hypertension related to her stroke and the residual s/s after being dx with Small right cerebellar infarct secondary to large vessel disease source CM discussed with her that the cerebellum controls balance, movement and coordination. CM discussed vertigo or dizziness is a s/s of the cerebellar stroke She voiced understanding. CM encouraged her to have follow up with her recommended neurologist for continued and worsening s/s. CM discussed the importance of managing the BP per her MD recommendations. She voiced appreciation and understanding. She was referred also to her after summary sheets that gave her some information about her stroke, diet and home care.  CM discussed and encouraged her follow up with the neurologist, Dr Leonie Man She report she is not sure if she will see him and needs to find out if he is in network with her  [facility] coverage. CM referred her to [place] services or Dr Leonie Man billing office   She discussed continuing to note blood in her stools. She reports mentioning it to the hospital RN and MD, She denies hemorrhoids, reports at one time she was having 7 stools in the mornings that go from hard to loose. She denies abdominal pain, n/v, and GERD/heartburns s/s. She states she was offered an appointment by her primary MD (who is aware also) to a GI MD but did not go because the MD was from the [place] MDs. She reports having a "bad experience with [facility] [place] doctor"  She was encouraged to speak with her MD staff about rescheduling a GI appointment with a GI MD of her choice She states she will consider [facility]    DM noted on her medical problem list She denies having DM CM discussed it and reviewed her last 09/14/18 HgA1c as 5.6 She reports the first time she has been informed of this was during her hospitalization. CM reviewed the primary MD notes for a few office visits and DM is not documented CM noted it was only documented during the last hospital stay CM referred her back to Dr Wynetta Emery to discuss this   Social: Mrs Gainer lives alone She confirms she is on SSI She denies transportation concerns and has the support of her family (daughter, friend) She can drive but does not like to drive.   Conditions:CVA, HTN, pulmonary HTN, gout, depression   Fall in 12/20 related to a scattered rug in her home per pt. Denies injury  Medications: Gets her medications filled via mail order or local [facility] She denies concerns with taking medications as prescribed, affording medications, side effects of medications and questions about medications,   DME rolling walker, 3 in 1 from [employer]  Appointments: Friday 09/24/18 hospital follow up with Dr Wynetta Emery and to return in May 2020 for scheduled appointment  Advance directives: Denies need for assist with advance directives  Denies need for assist with  changes to present advance directives    Consent: [facility] RN CM reviewed [facility] services with patient. Patient gave verbal consent for services. Advised patient that other post discharge calls may occur to assess how the patient is doing following the recent hospitalization. Patient voiced understanding and was appreciative of f/u call.  Advised patient that there will be further automated EMMI- post discharge calls to assess how the patient is doing following the recent hospitalization Advised the patient that another call may be received from a nurse if any of their responses were abnormal. Patient voiced understanding and was appreciative of f/u call.  Plan: [facility] RN CM will send EMMI education related to stroke  Route note to PCP CM able to check [facility] provider list for her zip code and [facility] coverage. The site lists Dr Leonie Man as in [place]. Lavina Hamman, RN, BSN, [place] Management Care Coordinator Direct Number [phone] Mobile number [phone]  Main [facility] number [phone] Fax number [phone]

## 2018-09-21 DIAGNOSIS — J392 Other diseases of pharynx: Secondary | ICD-10-CM | POA: Diagnosis not present

## 2018-09-21 DIAGNOSIS — R42 Dizziness and giddiness: Secondary | ICD-10-CM | POA: Diagnosis not present

## 2018-09-21 DIAGNOSIS — N183 Chronic kidney disease, stage 3 (moderate): Secondary | ICD-10-CM | POA: Diagnosis not present

## 2018-09-21 DIAGNOSIS — I129 Hypertensive chronic kidney disease with stage 1 through stage 4 chronic kidney disease, or unspecified chronic kidney disease: Secondary | ICD-10-CM | POA: Diagnosis not present

## 2018-09-21 DIAGNOSIS — I69351 Hemiplegia and hemiparesis following cerebral infarction affecting right dominant side: Secondary | ICD-10-CM | POA: Diagnosis not present

## 2018-09-21 DIAGNOSIS — E1122 Type 2 diabetes mellitus with diabetic chronic kidney disease: Secondary | ICD-10-CM | POA: Diagnosis not present

## 2018-09-21 NOTE — Telephone Encounter (Signed)
FYI  Returned [place] from [facility] and left a detailed vm giving verbal orders

## 2018-09-21 NOTE — Telephone Encounter (Signed)
Follow up    [place] from [facility] is calling to request a skilled nurse due to patient blood pressure being elevated and also request to have a social worker because the patient lives alone. Please f/u

## 2018-09-22 DIAGNOSIS — E1122 Type 2 diabetes mellitus with diabetic chronic kidney disease: Secondary | ICD-10-CM | POA: Diagnosis not present

## 2018-09-22 DIAGNOSIS — J392 Other diseases of pharynx: Secondary | ICD-10-CM | POA: Diagnosis not present

## 2018-09-22 DIAGNOSIS — I69351 Hemiplegia and hemiparesis following cerebral infarction affecting right dominant side: Secondary | ICD-10-CM | POA: Diagnosis not present

## 2018-09-22 DIAGNOSIS — N183 Chronic kidney disease, stage 3 (moderate): Secondary | ICD-10-CM | POA: Diagnosis not present

## 2018-09-22 DIAGNOSIS — I129 Hypertensive chronic kidney disease with stage 1 through stage 4 chronic kidney disease, or unspecified chronic kidney disease: Secondary | ICD-10-CM | POA: Diagnosis not present

## 2018-09-22 DIAGNOSIS — R42 Dizziness and giddiness: Secondary | ICD-10-CM | POA: Diagnosis not present

## 2018-09-23 DIAGNOSIS — I69351 Hemiplegia and hemiparesis following cerebral infarction affecting right dominant side: Secondary | ICD-10-CM | POA: Diagnosis not present

## 2018-09-23 DIAGNOSIS — E1122 Type 2 diabetes mellitus with diabetic chronic kidney disease: Secondary | ICD-10-CM | POA: Diagnosis not present

## 2018-09-23 DIAGNOSIS — J392 Other diseases of pharynx: Secondary | ICD-10-CM | POA: Diagnosis not present

## 2018-09-23 DIAGNOSIS — R42 Dizziness and giddiness: Secondary | ICD-10-CM | POA: Diagnosis not present

## 2018-09-23 DIAGNOSIS — N183 Chronic kidney disease, stage 3 (moderate): Secondary | ICD-10-CM | POA: Diagnosis not present

## 2018-09-23 DIAGNOSIS — I129 Hypertensive chronic kidney disease with stage 1 through stage 4 chronic kidney disease, or unspecified chronic kidney disease: Secondary | ICD-10-CM | POA: Diagnosis not present

## 2018-09-24 ENCOUNTER — Encounter: Payer: Self-pay | Admitting: Internal Medicine

## 2018-09-24 ENCOUNTER — Other Ambulatory Visit: Payer: Self-pay

## 2018-09-24 ENCOUNTER — Other Ambulatory Visit: Payer: Self-pay | Admitting: *Deleted

## 2018-09-24 ENCOUNTER — Ambulatory Visit: Payer: Medicare HMO | Attending: Internal Medicine | Admitting: Internal Medicine

## 2018-09-24 VITALS — BP 162/82 | HR 68 | Temp 98.2°F | Resp 16 | Wt 135.6 lb

## 2018-09-24 DIAGNOSIS — R3129 Other microscopic hematuria: Secondary | ICD-10-CM | POA: Diagnosis not present

## 2018-09-24 DIAGNOSIS — E7800 Pure hypercholesterolemia, unspecified: Secondary | ICD-10-CM

## 2018-09-24 DIAGNOSIS — I10 Essential (primary) hypertension: Secondary | ICD-10-CM | POA: Diagnosis not present

## 2018-09-24 DIAGNOSIS — G4700 Insomnia, unspecified: Secondary | ICD-10-CM

## 2018-09-24 DIAGNOSIS — N183 Chronic kidney disease, stage 3 unspecified: Secondary | ICD-10-CM

## 2018-09-24 DIAGNOSIS — Z8673 Personal history of transient ischemic attack (TIA), and cerebral infarction without residual deficits: Secondary | ICD-10-CM

## 2018-09-24 DIAGNOSIS — R220 Localized swelling, mass and lump, head: Secondary | ICD-10-CM

## 2018-09-24 DIAGNOSIS — J358 Other chronic diseases of tonsils and adenoids: Secondary | ICD-10-CM | POA: Insufficient documentation

## 2018-09-24 DIAGNOSIS — K625 Hemorrhage of anus and rectum: Secondary | ICD-10-CM | POA: Diagnosis not present

## 2018-09-24 MED ORDER — PRAVASTATIN SODIUM 10 MG PO TABS: 5.0000 mg | ORAL_TABLET | Freq: Every day | ORAL | 1 refills | Status: DC

## 2018-09-24 MED ORDER — AMLODIPINE BESYLATE 5 MG PO TABS: 5.0000 mg | ORAL_TABLET | Freq: Every day | ORAL | 3 refills | Status: DC

## 2018-09-24 MED ORDER — MELATONIN 3 MG PO TABS: 3.0000 mg | ORAL_TABLET | Freq: Every evening | ORAL | 0 refills | Status: DC | PRN

## 2018-09-24 NOTE — Patient Instructions (Signed)
Your blood pressure is not at goal.  Please restart the amlodipine.  I have sent the prescription to your pharmacy.  I have sent a referral for you to see the neurologist Dr. Leonie Man.  I have sent a referral for you to see the ear nose and throat specialist for the mass seen in the right tonsil on CAT scan.  I have submitted a referral to the gastroenterologist for the rectal bleeding.  Once I get your blood count results I will get back to you to let you know whether we need to discontinue the aspirin.  For now continue the aspirin and Plavix.

## 2018-09-24 NOTE — Progress Notes (Signed)
Patient ID: Tina Waller, female    DOB: March 27, 1941  MRN: [id number]  CC: Hospitalization Follow-up   Subjective: Tina Waller is a 78 y.o. female who presents for hosp f/u Her concerns today include:  Patient with history ofHTN,HL, vit D deficiency, osteoporosis on Dexascandone fall 2017, primary hyperparathyroidism,  depression,CVA in 2008 and gout.  Patient hospitalized 3/2-10/2018 with hypertensive emergency and acute small right cerebellar infarct.  She was noted to have multiple remote lacunar infarcts and remote infarct in the left occipital lobe.  On CTA of the head and neck, she was noted to have widespread atherosclerosis with severe stenosis of the right vertebral artery, left vertebral and both ICA.  Also noted to have aortic atherosclerosis.  Incidental finding of a 2 x 2 centimeter right tonsillar/pharyngeal mass for which ENT follow-up was recommended. Echo revealed LVEF of 60 to 65%.  LDL was 159.  Mention is made of diabetes but looking at patient's blood sugars from the hospital she was not in the range and A1c was 5.6.  Patient was seen by PT and OT and home therapies were recommended.  Permissive hypertension was allowed while in hospital by holding amlodipine.  However metoprolol and lower statin were continued.  LDL was 159.  Patient was not placed on statin because she had diarrhea in the past with Lipitor.  Hospital course complicated with microscopic hematuria.  Patient had no UTI symptoms.  Showed 11-20 RBCs per high-power field.  Plan is to repeat UA today.  1.  CVA/HTN: Receiving home PT/OT 2 times a week through [facility] which she is finding helpful.  She ambulates with a cane.  She has not had any falls.  She has also had visit from the home nurse.  Compliant with Plavix and aspirin. -Taking metoprolol, losartan and furosemide.  Checks blood pressure daily and has a log with her.  Some of these readings are 168/78, 161/79, 169/80, 172/73.  She has not  taken her medicines as yet today as she has not eaten. -Has had problems falling asleep and staying asleep since her most recent stroke.  She is requesting a sleep aid. -has form for [employer] to help with transportation to medical appt  2.  Right tonsillar mass: Needs a referral to ENT Dr. Benjamine Mola.  Denies any sore throat.  Sometimes has difficulty swallowing solids.  3.  Microscopic hematuria: Patient states she had one episode of gross hematuria while in the hospital but nothing since then.  Denies having any dysuria at that time.  We will recheck UA today.  Will need referral to urology  4.  She has been noticing blood in the stools intermittently for the past 6 months.  Last noticed it about 2 days ago.  Denies any abdominal pain at this time.  Diabetes listed on the chart but patient does not have diabetes.    Patient Active Problem List   Diagnosis Date Noted  . Hypertensive urgency 09/14/2018  . Type 2 diabetes mellitus ([place]) 09/14/2018  . Anxiety 09/14/2018  . Acute CVA (cerebrovascular accident) ([place]) 09/14/2018  . Primary hyperparathyroidism ([place]) 02/10/2018  . Vitamin B 12 deficiency 12/11/2017  . Pulmonary hypertension, mild ([place]) 12/11/2017  . CKD (chronic kidney disease) stage 3, GFR 30-59 ml/min (HCC) 04/17/2017  . Stasis dermatitis of both legs 04/17/2017  . Osteoporosis 12/22/2016  . Hx of gout 12/22/2016  . Estrogen deficiency 05/12/2016  . Vitamin D deficiency 05/12/2016  . Weight loss 05/12/2016  . HTN (hypertension) 06/12/2015  .  History of CVA (cerebrovascular accident) 03/28/2015     Current Outpatient Medications on File Prior to Visit  Medication Sig Dispense Refill  . aspirin EC 81 MG tablet Take 81 mg by mouth daily.    . clopidogrel (PLAVIX) 75 MG tablet Take 1 tablet (75 mg total) by mouth daily. 30 tablet 0  . Cyanocobalamin (VITAMIN B 12 PO) Take 1 tablet by mouth daily.    . furosemide (LASIX) 40 MG tablet Take 1 tablet (40 mg total) by mouth daily. 30  tablet 2  . losartan (COZAAR) 100 MG tablet Take 1 tablet (100 mg total) by mouth daily. 90 tablet 3  . metoprolol succinate (TOPROL-XL) 25 MG 24 hr tablet Take 1 tab PO daily with a 50 mg tablet for a total of 75 mg daily 90 tablet 3  . metoprolol succinate (TOPROL-XL) 50 MG 24 hr tablet Take 1 tablet (50 mg total) by mouth daily. Take with or immediately following a meal. 30 tablet 2  . polyethylene glycol (MIRALAX) packet Take 17 g by mouth daily as needed for mild constipation.    . potassium chloride (KLOR-CON) 8 MEQ tablet Take 2 tablets (16 mEq total) by mouth daily. 180 tablet 1  . triamcinolone cream (KENALOG) 0.1 % Apply 1 application topically 2 (two) times daily as needed (rash).    Marland Kitchen VITAMIN D PO Take 1 tablet by mouth daily.     No current facility-administered medications on file prior to visit.     Allergies  Allergen Reactions  . Lipitor [Atorvastatin] Diarrhea  . Lisinopril Cough    Social History   Socioeconomic History  . Marital status: Divorced    Spouse name: Not on file  . Number of children: Not on file  . Years of education: Not on file  . Highest education level: Not on file  Occupational History  . Not on file  Social Needs  . Financial resource strain: Not on file  . Food insecurity:    Worry: Not on file    Inability: Not on file  . Transportation needs:    Medical: Not on file    Non-medical: Not on file  Tobacco Use  . Smoking status: Former Smoker    Last attempt to quit: 07/14/2006    Years since quitting: 12.2  . Smokeless tobacco: Never Used  Substance and Sexual Activity  . Alcohol use: Yes    Alcohol/week: 7.0 standard drinks    Types: 7 Shots of liquor per week    Comment: 2020  " I drink twice a week "  . Drug use: No  . Sexual activity: Not on file  Lifestyle  . Physical activity:    Days per week: Not on file    Minutes per session: Not on file  . Stress: Not on file  Relationships  . Social connections:    Talks on phone:  Not on file    Gets together: Not on file    Attends religious service: Not on file    Active member of club or organization: Not on file    Attends meetings of clubs or organizations: Not on file    Relationship status: Not on file  . Intimate partner violence:    Fear of current or ex partner: Not on file    Emotionally abused: Not on file    Physically abused: Not on file    Forced sexual activity: Not on file  Other Topics Concern  . Not on file  Social History Narrative  . Not on file    Family History  Problem Relation Age of Onset  . Heart disease Mother   . Osteoporosis Neg Hx     Past Surgical History:  Procedure Laterality Date  . ABDOMINAL HYSTERECTOMY    . BUNIONECTOMY    . CATARACT EXTRACTION Left     ROS: Review of Systems Negative except as stated above  PHYSICAL EXAM: BP (!) 162/82   Pulse 68   Temp 98.2 F (36.8 C) (Oral)   Resp 16   Wt 135 lb 9.6 oz (61.5 kg)   SpO2 97%   BMI 20.02 kg/m   Wt Readings from Last 3 Encounters:  09/24/18 135 lb 9.6 oz (61.5 kg)  09/06/18 137 lb 12.8 oz (62.5 kg)  07/20/18 134 lb (60.8 kg)    Physical Exam  General appearance - alert, well appearing, and in no distress Mental status - normal mood, behavior, speech, dress, motor activity, and thought processes Eyes - pupils equal and reactive, extraocular eye movements intact Mouth -moist oral mucosa.  Patient did not tolerate use of tongue depressor so I could not really see the back of her throat to see if there is a tonsillar mass on the right side. Neck - supple, no significant adenopathy Chest - clear to auscultation, no wheezes, rales or rhonchi, symmetric air entry Heart - normal rate, regular rhythm, normal S1, S2, no murmurs, rubs, clicks or gallops Extremities -trace lower extremity edema right greater than left.  Trace to 1+ ankle edema.  Of note patient has not taken furosemide as yet for today. Rectal exam:  CMA Pollock present.  No external  hemorrhoids.  Poor rectal tone.  Soft brown stools in rectal vault.  Hemoccult positive. GU: No external vaginal lesions seen.  CMP Latest Ref Rng & Units 09/13/2018 07/20/2018 07/13/2018  Glucose 70 - 99 mg/dL 125(H) - 104(H)  BUN 8 - 23 mg/dL 18 - 6(L)  Creatinine 0.44 - 1.00 mg/dL 1.09(H) - 0.89  Sodium 135 - 145 mmol/L 137 - 140  Potassium 3.5 - 5.1 mmol/L 3.7 3.4(L) 3.4(L)  Chloride 98 - 111 mmol/L 104 - 105  CO2 22 - 32 mmol/L 24 - 28  Calcium 8.9 - 10.3 mg/dL 9.4 - 8.8(L)  Total Protein 6.0 - 8.5 g/dL - - -  Total Bilirubin 0.0 - 1.2 mg/dL - - -  Alkaline Phos 39 - 117 IU/L - - -  AST 0 - 40 IU/L - - -  ALT 0 - 32 IU/L - - -   Lipid Panel     Component Value Date/Time   CHOL 215 (H) 09/14/2018 0511   TRIG 76 09/14/2018 0511   HDL 41 09/14/2018 0511   CHOLHDL 5.2 09/14/2018 0511   VLDL 15 09/14/2018 0511   LDLCALC 159 (H) 09/14/2018 0511    CBC    Component Value Date/Time   WBC 5.0 09/15/2018 0520   RBC 3.89 09/15/2018 0520   HGB 12.2 09/15/2018 0520   HGB 12.9 11/05/2017 1559   HCT 36.4 09/15/2018 0520   HCT 37.3 11/05/2017 1559   PLT 249 09/15/2018 0520   PLT 443 (H) 11/05/2017 1559   MCV 93.6 09/15/2018 0520   MCV 102 (H) 11/05/2017 1559   MCH 31.4 09/15/2018 0520   MCHC 33.5 09/15/2018 0520   RDW 12.1 09/15/2018 0520   RDW 16.7 (H) 11/05/2017 1559   LYMPHSABS 1.0 11/12/2016 1132   MONOABS 611 04/21/2016 1437   EOSABS 0.1 11/12/2016  1132   BASOSABS 0.0 11/12/2016 1132    ASSESSMENT AND PLAN: 1. History of cerebrovascular accident (CVA) involving cerebellum Inform patient that we have to work on getting her blood pressure better.  Continue Cozaar, metoprolol 75 mg and furosemide.  I recommended that she restart the amlodipine 5 mg daily given that recorded blood pressure readings have not been at goal. Will check CBC today.  For now she will continue aspirin and Plavix and plan would be to continue only with Plavix after 1 month. Continue home PT and OT  until she reaches the goal - pravastatin (PRAVACHOL) 10 MG tablet; Take 0.5 tablets (5 mg total) by mouth daily. For high cholesterol  Dispense: 30 tablet; Refill: 1 - Ambulatory referral to Neurology  2. Essential hypertension See #1 above - Basic Metabolic Panel - amLODipine (NORVASC) 5 MG tablet; Take 1 tablet (5 mg total) by mouth daily.  Dispense: 90 tablet; Refill: 3  3. Pure hypercholesterolemia Given atherosclerosis seen throughout the cranial and carotid vasculature and LDL not being at goal, patient is willing to try a low dose of a different statin to see whether she tolerates it.  We will put her on a very low dose of Pravachol and if she tolerates it we can increase the dose.  If she does not tolerate we will use Zetia - pravastatin (PRAVACHOL) 10 MG tablet; Take 0.5 tablets (5 mg total) by mouth daily. For high cholesterol  Dispense: 30 tablet; Refill: 1  4. Rectal bleeding We will monitor CBC with her being on aspirin and Plavix - CBC - Ambulatory referral to Gastroenterology  5. Tonsillar mass - Ambulatory referral to ENT  6. Microscopic hematuria - Urinalysis  7. CKD (chronic kidney disease) stage 3, GFR 30-59 ml/min (HCC) Observe.  Avoid NSAIDs  8. Insomnia, unspecified type Good sleep hygiene discussed and encouraged. I am reluctant to put her on anything that can increase fall risk.  She is willing to try melatonin. - Melatonin 3 MG TABS; Take 1 tablet (3 mg total) by mouth at bedtime as needed.  Dispense: 60 tablet; Refill: 0   Patient was given the opportunity to ask questions.  Patient verbalized understanding of the plan and was able to repeat key elements of the plan.   Orders Placed This Encounter  Procedures  . CBC  . Basic Metabolic Panel  . Urinalysis  . Ambulatory referral to Gastroenterology  . Ambulatory referral to Neurology  . Ambulatory referral to ENT     Requested Prescriptions   Signed Prescriptions Disp Refills  . pravastatin  (PRAVACHOL) 10 MG tablet 30 tablet 1    Sig: Take 0.5 tablets (5 mg total) by mouth daily. For high cholesterol  . amLODipine (NORVASC) 5 MG tablet 90 tablet 3    Sig: Take 1 tablet (5 mg total) by mouth daily.  . Melatonin 3 MG TABS 60 tablet 0    Sig: Take 1 tablet (3 mg total) by mouth at bedtime as needed.    Follow-up in 6 weeks. Karle Plumber, MD, FACP

## 2018-09-24 NOTE — Patient Outreach (Signed)
[facility]) Care Management  09/24/2018  GWENNA FUSTON 02/10/1941 [id number]   Care coordination  Re sent EMMI materials ( Online issues) to her in via mail with assist of [facility] CMA for  acute cerebellar ataxia, preventing a second stroke: know the signs of stroke, bloody stools, adult bloody stools discharge instructions,stroke, and beginner standing balance exercises    Kimberly L. Lavina Hamman, RN, BSN, [place] Coordinator Office number [phone] Mobile number [phone]  Main [facility] number [phone] Fax number [phone]

## 2018-09-25 LAB — CBC
Hematocrit: 34.8 % (ref 34.0–46.6)
Hemoglobin: 12.2 g/dL (ref 11.1–15.9)
MCH: 31.7 pg (ref 26.6–33.0)
MCHC: 35.1 g/dL (ref 31.5–35.7)
MCV: 90 fL (ref 79–97)
Platelets: 284 10*3/uL (ref 150–450)
RBC: 3.85 x10E6/uL (ref 3.77–5.28)
RDW: 11.4 % — AB (ref 11.7–15.4)
WBC: 4.4 10*3/uL (ref 3.4–10.8)

## 2018-09-25 LAB — BASIC METABOLIC PANEL
BUN/Creatinine Ratio: 17 (ref 12–28)
BUN: 20 mg/dL (ref 8–27)
CALCIUM: 9.9 mg/dL (ref 8.7–10.3)
CO2: 23 mmol/L (ref 20–29)
Chloride: 104 mmol/L (ref 96–106)
Creatinine, Ser: 1.2 mg/dL — ABNORMAL HIGH (ref 0.57–1.00)
GFR calc Af Amer: 51 mL/min/{1.73_m2} — ABNORMAL LOW (ref >59)
GFR calc non Af Amer: 44 mL/min/{1.73_m2} — ABNORMAL LOW (ref >59)
Glucose: 104 mg/dL — ABNORMAL HIGH (ref 65–99)
POTASSIUM: 4.8 mmol/L (ref 3.5–5.2)
Sodium: 142 mmol/L (ref 134–144)

## 2018-09-25 LAB — URINALYSIS
Bilirubin, UA: NEGATIVE
Glucose, UA: NEGATIVE
Ketones, UA: NEGATIVE
Leukocytes, UA: NEGATIVE
Nitrite, UA: NEGATIVE
RBC, UA: NEGATIVE
Specific Gravity, UA: 1.013 (ref 1.005–1.030)
UUROB: 0.2 mg/dL (ref 0.2–1.0)
pH, UA: 6.5 (ref 5.0–7.5)

## 2018-09-27 ENCOUNTER — Emergency Department (HOSPITAL_COMMUNITY)
Admission: EM | Admit: 2018-09-27 | Discharge: 2018-09-27 | Disposition: A | Discharge status: Home / Self Care | Payer: Medicare HMO | Attending: Emergency Medicine | Admitting: Emergency Medicine

## 2018-09-27 ENCOUNTER — Emergency Department (HOSPITAL_COMMUNITY): Payer: Medicare HMO

## 2018-09-27 ENCOUNTER — Other Ambulatory Visit: Payer: Self-pay

## 2018-09-27 ENCOUNTER — Telehealth: Payer: Self-pay | Admitting: Internal Medicine

## 2018-09-27 ENCOUNTER — Encounter (HOSPITAL_COMMUNITY): Payer: Self-pay | Admitting: Emergency Medicine

## 2018-09-27 DIAGNOSIS — Z7982 Long term (current) use of aspirin: Secondary | ICD-10-CM | POA: Diagnosis not present

## 2018-09-27 DIAGNOSIS — R42 Dizziness and giddiness: Secondary | ICD-10-CM | POA: Diagnosis not present

## 2018-09-27 DIAGNOSIS — Z8673 Personal history of transient ischemic attack (TIA), and cerebral infarction without residual deficits: Secondary | ICD-10-CM | POA: Diagnosis not present

## 2018-09-27 DIAGNOSIS — Z7902 Long term (current) use of antithrombotics/antiplatelets: Secondary | ICD-10-CM | POA: Diagnosis not present

## 2018-09-27 DIAGNOSIS — I10 Essential (primary) hypertension: Secondary | ICD-10-CM

## 2018-09-27 DIAGNOSIS — I129 Hypertensive chronic kidney disease with stage 1 through stage 4 chronic kidney disease, or unspecified chronic kidney disease: Secondary | ICD-10-CM | POA: Insufficient documentation

## 2018-09-27 DIAGNOSIS — Z79899 Other long term (current) drug therapy: Secondary | ICD-10-CM | POA: Diagnosis not present

## 2018-09-27 DIAGNOSIS — E1122 Type 2 diabetes mellitus with diabetic chronic kidney disease: Secondary | ICD-10-CM | POA: Diagnosis not present

## 2018-09-27 DIAGNOSIS — J392 Other diseases of pharynx: Secondary | ICD-10-CM | POA: Diagnosis not present

## 2018-09-27 DIAGNOSIS — I69351 Hemiplegia and hemiparesis following cerebral infarction affecting right dominant side: Secondary | ICD-10-CM | POA: Diagnosis not present

## 2018-09-27 DIAGNOSIS — N183 Chronic kidney disease, stage 3 (moderate): Secondary | ICD-10-CM | POA: Diagnosis not present

## 2018-09-27 DIAGNOSIS — Z87891 Personal history of nicotine dependence: Secondary | ICD-10-CM | POA: Diagnosis not present

## 2018-09-27 DIAGNOSIS — R0902 Hypoxemia: Secondary | ICD-10-CM | POA: Diagnosis not present

## 2018-09-27 LAB — COMPREHENSIVE METABOLIC PANEL
ALBUMIN: 3.6 g/dL (ref 3.5–5.0)
ALT: 11 U/L (ref 0–44)
AST: 23 U/L (ref 15–41)
Alkaline Phosphatase: 114 U/L (ref 38–126)
Anion gap: 6 (ref 5–15)
BUN: 18 mg/dL (ref 8–23)
CO2: 25 mmol/L (ref 22–32)
Calcium: 9.5 mg/dL (ref 8.9–10.3)
Chloride: 106 mmol/L (ref 98–111)
Creatinine, Ser: 1.25 mg/dL — ABNORMAL HIGH (ref 0.44–1.00)
GFR calc Af Amer: 49 mL/min — ABNORMAL LOW (ref >60)
GFR calc non Af Amer: 42 mL/min — ABNORMAL LOW (ref >60)
Glucose, Bld: 108 mg/dL — ABNORMAL HIGH (ref 70–99)
POTASSIUM: 5 mmol/L (ref 3.5–5.1)
Sodium: 137 mmol/L (ref 135–145)
TOTAL PROTEIN: 6.9 g/dL (ref 6.5–8.1)
Total Bilirubin: 0.7 mg/dL (ref 0.3–1.2)

## 2018-09-27 LAB — URINALYSIS, ROUTINE W REFLEX MICROSCOPIC
BILIRUBIN URINE: NEGATIVE
Glucose, UA: NEGATIVE mg/dL
Ketones, ur: NEGATIVE mg/dL
Leukocytes,Ua: NEGATIVE
Nitrite: NEGATIVE
Protein, ur: 100 mg/dL — AB
Specific Gravity, Urine: 1.008 (ref 1.005–1.030)
pH: 7 (ref 5.0–8.0)

## 2018-09-27 LAB — RAPID URINE DRUG SCREEN, HOSP PERFORMED
Amphetamines: NOT DETECTED
Barbiturates: NOT DETECTED
Benzodiazepines: NOT DETECTED
Cocaine: NOT DETECTED
Opiates: NOT DETECTED
Tetrahydrocannabinol: NOT DETECTED

## 2018-09-27 LAB — DIFFERENTIAL
Abs Immature Granulocytes: 0.01 10*3/uL (ref 0.00–0.07)
Basophils Absolute: 0 10*3/uL (ref 0.0–0.1)
Basophils Relative: 0 %
EOS ABS: 0.2 10*3/uL (ref 0.0–0.5)
Eosinophils Relative: 4 %
IMMATURE GRANULOCYTES: 0 %
Lymphocytes Relative: 30 %
Lymphs Abs: 1.4 10*3/uL (ref 0.7–4.0)
Monocytes Absolute: 0.6 10*3/uL (ref 0.1–1.0)
Monocytes Relative: 12 %
Neutro Abs: 2.4 10*3/uL (ref 1.7–7.7)
Neutrophils Relative %: 54 %

## 2018-09-27 LAB — ETHANOL: Alcohol, Ethyl (B): <10 mg/dL (ref <10)

## 2018-09-27 LAB — CBC
HEMATOCRIT: 38 % (ref 36.0–46.0)
Hemoglobin: 12.5 g/dL (ref 12.0–15.0)
MCH: 31.3 pg (ref 26.0–34.0)
MCHC: 32.9 g/dL (ref 30.0–36.0)
MCV: 95.2 fL (ref 80.0–100.0)
Platelets: 257 10*3/uL (ref 150–400)
RBC: 3.99 MIL/uL (ref 3.87–5.11)
RDW: 12.1 % (ref 11.5–15.5)
WBC: 4.5 10*3/uL (ref 4.0–10.5)
nRBC: 0 % (ref 0.0–0.2)

## 2018-09-27 LAB — PROTIME-INR
INR: 1 (ref 0.8–1.2)
Prothrombin Time: 13.1 seconds (ref 11.4–15.2)

## 2018-09-27 LAB — APTT: aPTT: 39 seconds — ABNORMAL HIGH (ref 24–36)

## 2018-09-27 MED ORDER — HYDRALAZINE HCL 20 MG/ML IJ SOLN
20.0000 mg | Freq: Once | INTRAMUSCULAR | Status: AC
  Administered 2018-09-27: 20 mg via INTRAVENOUS
  Filled 2018-09-27: qty 1

## 2018-09-27 MED ORDER — AMLODIPINE BESYLATE 5 MG PO TABS
5.0000 mg | ORAL_TABLET | Freq: Once | ORAL | Status: AC
  Administered 2018-09-27: 5 mg via ORAL
  Filled 2018-09-27: qty 1

## 2018-09-27 MED ORDER — MECLIZINE HCL 25 MG PO TABS
25.0000 mg | ORAL_TABLET | Freq: Once | ORAL | Status: AC
  Administered 2018-09-27: 25 mg via ORAL
  Filled 2018-09-27: qty 1

## 2018-09-27 MED ORDER — MECLIZINE HCL 25 MG PO TABS: 25.0000 mg | ORAL_TABLET | Freq: Three times a day (TID) | ORAL | 0 refills | Status: DC | PRN

## 2018-09-27 MED ORDER — GADOBUTROL 1 MMOL/ML IV SOLN
6.0000 mL | Freq: Once | INTRAVENOUS | Status: AC | PRN
  Administered 2018-09-27: 6 mL via INTRAVENOUS

## 2018-09-27 MED ORDER — LORAZEPAM 2 MG/ML IJ SOLN
1.0000 mg | Freq: Once | INTRAMUSCULAR | Status: AC
  Administered 2018-09-27: 1 mg via INTRAVENOUS
  Filled 2018-09-27: qty 1

## 2018-09-27 NOTE — ED Provider Notes (Signed)
Patient was seen by Dr. Gilford Raid.  Please see her note.  MRI negative for acute process.  Stable for dc   Dorie Rank, MD 09/27/18 1721

## 2018-09-27 NOTE — Telephone Encounter (Signed)
Occupational Therapist Tommi Emery called and was at the house of the patient.  Patient has elevated blood pressure.  OT Wynetta Emery states that patient BP is 188/90.  All blood pressures taken have been above 018 systolic.  Patient also endorses dizziness intermittent as well as orthostatically.   OT advised to tell patient to go to ED for further evaluation.

## 2018-09-27 NOTE — Telephone Encounter (Signed)
Don from occupational therapy called in stating pts BP was 188/80 and that pt was complaining about dizziness

## 2018-09-27 NOTE — ED Provider Notes (Signed)
[facility] EMERGENCY DEPARTMENT Provider Note   CSN: [id number] Arrival date & time: 09/27/18  1033    History   Chief Complaint Chief Complaint  Patient presents with  . Dizziness    HPI Tina Waller is a 78 y.o. female.     Pt presents to the ED today with dizziness.  Pt was hospitalized from 3/3-3/4 for an acute right cerebellar infarct causing dizziness.  Upon discharge, she was told to hold her amlodipine for some permissive HTN following the stroke.  She has been compliant with instructions.  She did f/u with her pcp on 3/13 who refilled her amlodipine and told her to start taking it.  She was not sure what to do, so she had not started the amlodipine back up yet.  This morning, she felt the same way that she did prior to her stroke.  She said her bp has been elevated at home.  Of note, pt had a CT angio which showed severe stenosis of the right and left vertebral arteries and both ICA.  She was treated with plavix and asa.  Of note, pt also had a right tonsillar mass for which she is going to see Dr. Benjamine Mola.        Past Medical History:  Diagnosis Date  . Anemia   . Anxiety   . Cataract   . Depression   . Diabetes mellitus without complication ([place])    pt denies  . Gout   . Hypertension   . Stroke [facility]) 2008  . Type 2 diabetes mellitus ([place]) 09/14/2018    Patient Active Problem List   Diagnosis Date Noted  . Microscopic hematuria 09/24/2018  . Tonsillar mass 09/24/2018  . Rectal bleeding 09/24/2018  . Insomnia 09/24/2018  . Hypertensive urgency 09/14/2018  . Anxiety 09/14/2018  . Acute CVA (cerebrovascular accident) ([place]) 09/14/2018  . Primary hyperparathyroidism ([place]) 02/10/2018  . Vitamin B 12 deficiency 12/11/2017  . Pulmonary hypertension, mild ([place]) 12/11/2017  . CKD (chronic kidney disease) stage 3, GFR 30-59 ml/min (HCC) 04/17/2017  . Stasis dermatitis of both legs 04/17/2017  . Osteoporosis 12/22/2016  . Hx of gout  12/22/2016  . Estrogen deficiency 05/12/2016  . Vitamin D deficiency 05/12/2016  . Weight loss 05/12/2016  . HTN (hypertension) 06/12/2015  . History of CVA (cerebrovascular accident) 03/28/2015    Past Surgical History:  Procedure Laterality Date  . ABDOMINAL HYSTERECTOMY    . BUNIONECTOMY    . CATARACT EXTRACTION Left      OB History   No obstetric history on file.      Home Medications    Prior to Admission medications   Medication Sig Start Date End Date Taking? Authorizing Provider  amLODipine (NORVASC) 5 MG tablet Take 1 tablet (5 mg total) by mouth daily. 09/24/18   [name], MD  aspirin EC 81 MG tablet Take 81 mg by mouth daily.    [provider]  clopidogrel (PLAVIX) 75 MG tablet Take 1 tablet (75 mg total) by mouth daily. 09/16/18   Hongalgi, Lenis Dickinson, MD  Cyanocobalamin (VITAMIN B 12 PO) Take 1 tablet by mouth daily.    [provider]  furosemide (LASIX) 40 MG tablet Take 1 tablet (40 mg total) by mouth daily. 07/27/18   [name], MD  losartan (COZAAR) 100 MG tablet Take 1 tablet (100 mg total) by mouth daily. 05/27/18   [name], MD  Melatonin 3 MG TABS Take 1 tablet (3 mg  total) by mouth at bedtime as needed. 09/24/18   [name], MD  metoprolol succinate (TOPROL-XL) 25 MG 24 hr tablet Take 1 tab PO daily with a 50 mg tablet for a total of 75 mg daily 08/06/18   [name], MD  metoprolol succinate (TOPROL-XL) 50 MG 24 hr tablet Take 1 tablet (50 mg total) by mouth daily. Take with or immediately following a meal. 07/27/18   [name], MD  polyethylene glycol [facility]) packet Take 17 g by mouth daily as needed for mild constipation. 09/15/18   Hongalgi, Lenis Dickinson, MD  potassium chloride (KLOR-CON) 8 MEQ tablet Take 2 tablets (16 mEq total) by mouth daily. 07/21/18   [name], MD  pravastatin (PRAVACHOL) 10 MG tablet Take 0.5 tablets (5 mg total) by mouth daily. For high cholesterol 09/24/18    [name], MD  triamcinolone cream (KENALOG) 0.1 % Apply 1 application topically 2 (two) times daily as needed (rash). 09/15/18   Hongalgi, Lenis Dickinson, MD  VITAMIN D PO Take 1 tablet by mouth daily.    [provider]    Family History Family History  Problem Relation Age of Onset  . Heart disease Mother   . Osteoporosis Neg Hx     Social History Social History   Tobacco Use  . Smoking status: Former Smoker    Last attempt to quit: 07/14/2006    Years since quitting: 12.2  . Smokeless tobacco: Never Used  Substance Use Topics  . Alcohol use: Yes    Alcohol/week: 7.0 standard drinks    Types: 7 Shots of liquor per week    Comment: 2020  " I drink twice a week "  . Drug use: No     Allergies   Lipitor [atorvastatin] and Lisinopril   Review of Systems Review of Systems  Neurological: Positive for dizziness.  All other systems reviewed and are negative.    Physical Exam Updated Vital Signs BP (!) 157/50   Pulse 66   Temp 97.6 F (36.4 C) (Oral)   Resp 14   Ht 5\' 9"  (1.753 m)   Wt 61.5 kg   SpO2 99%   BMI 20.02 kg/m   Physical Exam Vitals signs and nursing note reviewed.  Constitutional:      Appearance: Normal appearance.  HENT:     Head: Normocephalic and atraumatic.     Right Ear: External ear normal.     Left Ear: External ear normal.     Nose: Nose normal.     Mouth/Throat:     Mouth: Mucous membranes are moist.     Pharynx: Oropharynx is clear.  Eyes:     Extraocular Movements: Extraocular movements intact.     Conjunctiva/sclera: Conjunctivae normal.     Pupils: Pupils are equal, round, and reactive to light.  Neck:     Musculoskeletal: Normal range of motion and neck supple.  Cardiovascular:     Rate and Rhythm: Normal rate and regular rhythm.     Pulses: Normal pulses.     Heart sounds: Normal heart sounds.  Pulmonary:     Effort: Pulmonary effort is normal.     Breath sounds: Normal breath sounds.  Abdominal:     General:  Abdomen is flat. Bowel sounds are normal.     Palpations: Abdomen is soft.  Musculoskeletal: Normal range of motion.  Skin:    General: Skin is warm.     Capillary Refill: Capillary refill takes less than 2  seconds.  Neurological:     General: No focal deficit present.     Mental Status: She is alert and oriented to person, place, and time.  Psychiatric:        Mood and Affect: Mood normal.        Behavior: Behavior normal.      ED Treatments / Results  Labs (all labs ordered are listed, but only abnormal results are displayed) Labs Reviewed  APTT - Abnormal; Notable for the following components:      Result Value   aPTT 39 (*)    All other components within normal limits  COMPREHENSIVE METABOLIC PANEL - Abnormal; Notable for the following components:   Glucose, Bld 108 (*)    Creatinine, Ser 1.25 (*)    GFR calc non Af Amer 42 (*)    GFR calc Af Amer 49 (*)    All other components within normal limits  URINALYSIS, ROUTINE W REFLEX MICROSCOPIC - Abnormal; Notable for the following components:   Color, Urine STRAW (*)    Hgb urine dipstick SMALL (*)    Protein, ur 100 (*)    Bacteria, UA RARE (*)    All other components within normal limits  ETHANOL  PROTIME-INR  CBC  DIFFERENTIAL  RAPID URINE DRUG SCREEN, HOSP PERFORMED    EKG EKG Interpretation  Date/Time:  Monday September 27 2018 10:39:17 EDT Ventricular Rate:  60 PR Interval:    QRS Duration: 116 QT Interval:  457 QTC Calculation: 457 R Axis:   -33 Text Interpretation:  Sinus rhythm LVH with secondary repolarization abnormality Anterior Q waves, possibly due to LVH No significant change since last tracing Confirmed by Isla Pence [phone]) on 09/27/2018 1:02:48 PM   Radiology Ct Head Wo Contrast  Result Date: 09/27/2018 CLINICAL DATA:  78 year old female. Dizziness beginning yesterday. Denies injury. Initial encounter. EXAM: CT HEAD WITHOUT CONTRAST TECHNIQUE: Contiguous axial images were obtained from the  base of the skull through the vertex without intravenous contrast. COMPARISON:  09/14/2018 CT and MR. FINDINGS: Brain: No intracranial hemorrhage or CT evidence of large acute infarct. Remote bilateral cerebellar and pontine infarcts. Remote thalamic and basal ganglia infarcts. Chronic microvascular changes. Global atrophy. No intracranial mass lesion noted on this unenhanced exam. Vascular: Prominent vascular calcifications. No acute hyperdense vessel. Skull: Negative Sinuses/Orbits: No acute orbital abnormality. Mild polypoid opacification sphenoid sinuses. Other: Mastoid air cells and middle ear cavities are clear. IMPRESSION: 1. No intracranial hemorrhage or CT evidence of large acute infarct. 2. Multiple remote infarcts and chronic microvascular changes. Please note that patient had a recent small acute right cerebellar infarct. Extension of this infarct may not be adequately assessed by CT imaging and if further delineation were clinically desired, MR may then be considered. 3. Atrophy. 4. Prominent vascular calcifications. Please see prior CTA report detailing significant vascular stenosis. Electronically Signed   By: Genia Del M.D.   On: 09/27/2018 12:17    Procedures Procedures (including critical care time)  Medications Ordered in ED Medications  meclizine (ANTIVERT) tablet 25 mg (25 mg Oral Given 09/27/18 1131)  amLODipine (NORVASC) tablet 5 mg (5 mg Oral Given 09/27/18 1301)  hydrALAZINE (APRESOLINE) injection 20 mg (20 mg Intravenous Given 09/27/18 1451)  LORazepam (ATIVAN) injection 1 mg (1 mg Intravenous Given 09/27/18 1540)     Initial Impression / Assessment and Plan / ED Course  I have reviewed the triage vital signs and the nursing notes.  Pertinent labs & imaging results that were available during my care  of the patient were reviewed by me and considered in my medical decision making (see chart for details).    Dizziness has improved, but since she just had a stroke 2 weeks  ago, I am repeating a MRI to make sure the stroke did not extend.  Labs and Xrays/CT reviewed.  Pt given her home dose of amlodipine as well as 20 mg of hydralazine IV for her BP.  Pt signed out to Dr. Tomi Bamberger pending results of MRI.  If ok, she can go home.  Final Clinical Impressions(s) / ED Diagnoses   Final diagnoses:  Vertigo  Essential hypertension    ED Discharge Orders    None       Isla Pence, MD 09/27/18 1551

## 2018-09-27 NOTE — ED Notes (Signed)
Patient verbalizes understanding of discharge instructions. Opportunity for questioning and answers were provided. Armband removed by staff, pt discharged from ED.  

## 2018-09-27 NOTE — ED Notes (Signed)
Patient transported to MRI 

## 2018-09-27 NOTE — Discharge Instructions (Signed)
Resume your amlodipine as directed by your doctor.

## 2018-09-27 NOTE — ED Triage Notes (Signed)
Per [facility] pt c/o dizziness onset of last night along with hypertension. Patient states she felt this way when she was here on the 2nd with a CVA. Patient has not been taking her amlodipine due to confusion.

## 2018-09-28 ENCOUNTER — Telehealth: Payer: Self-pay

## 2018-09-28 ENCOUNTER — Other Ambulatory Visit: Payer: Self-pay

## 2018-09-28 DIAGNOSIS — I10 Essential (primary) hypertension: Secondary | ICD-10-CM

## 2018-09-28 MED ORDER — CLOPIDOGREL BISULFATE 75 MG PO TABS: 75.0000 mg | ORAL_TABLET | Freq: Every day | ORAL | 0 refills | Status: DC

## 2018-09-28 MED ORDER — FUROSEMIDE 40 MG PO TABS: 40.0000 mg | ORAL_TABLET | Freq: Every day | ORAL | 1 refills | Status: DC

## 2018-09-28 MED ORDER — METOPROLOL SUCCINATE ER 50 MG PO TB24: 50.0000 mg | ORAL_TABLET | Freq: Every day | ORAL | 2 refills | Status: DC

## 2018-09-28 MED ORDER — METOPROLOL SUCCINATE ER 25 MG PO TB24: ORAL_TABLET | ORAL | 3 refills | Status: DC

## 2018-09-28 NOTE — Telephone Encounter (Signed)
Returned [place] from [facility] and gave verbal orders

## 2018-09-28 NOTE — Telephone Encounter (Signed)
[place] 2 times a week for two weeks.

## 2018-09-28 NOTE — Telephone Encounter (Signed)
Contacted pt to go over lab results pt is aware of results and doesn't have any questions or concerns 

## 2018-09-28 NOTE — Telephone Encounter (Signed)
Call placed to patient and completed SCAT application which was then faxed to SCAT eligibility.

## 2018-09-29 ENCOUNTER — Emergency Department (HOSPITAL_COMMUNITY)
Admission: EM | Admit: 2018-09-29 | Discharge: 2018-09-29 | Disposition: A | Discharge status: Home / Self Care | Payer: Medicare HMO | Attending: Emergency Medicine | Admitting: Emergency Medicine

## 2018-09-29 ENCOUNTER — Encounter: Payer: Self-pay | Admitting: Internal Medicine

## 2018-09-29 ENCOUNTER — Other Ambulatory Visit: Payer: Self-pay

## 2018-09-29 DIAGNOSIS — Z8673 Personal history of transient ischemic attack (TIA), and cerebral infarction without residual deficits: Secondary | ICD-10-CM | POA: Diagnosis not present

## 2018-09-29 DIAGNOSIS — N183 Chronic kidney disease, stage 3 (moderate): Secondary | ICD-10-CM | POA: Diagnosis not present

## 2018-09-29 DIAGNOSIS — I10 Essential (primary) hypertension: Secondary | ICD-10-CM | POA: Insufficient documentation

## 2018-09-29 DIAGNOSIS — I129 Hypertensive chronic kidney disease with stage 1 through stage 4 chronic kidney disease, or unspecified chronic kidney disease: Secondary | ICD-10-CM | POA: Diagnosis not present

## 2018-09-29 DIAGNOSIS — I491 Atrial premature depolarization: Secondary | ICD-10-CM | POA: Diagnosis not present

## 2018-09-29 DIAGNOSIS — Z87891 Personal history of nicotine dependence: Secondary | ICD-10-CM | POA: Diagnosis not present

## 2018-09-29 DIAGNOSIS — R42 Dizziness and giddiness: Secondary | ICD-10-CM | POA: Diagnosis not present

## 2018-09-29 DIAGNOSIS — I69351 Hemiplegia and hemiparesis following cerebral infarction affecting right dominant side: Secondary | ICD-10-CM | POA: Diagnosis not present

## 2018-09-29 DIAGNOSIS — E1122 Type 2 diabetes mellitus with diabetic chronic kidney disease: Secondary | ICD-10-CM | POA: Diagnosis not present

## 2018-09-29 DIAGNOSIS — J392 Other diseases of pharynx: Secondary | ICD-10-CM | POA: Diagnosis not present

## 2018-09-29 LAB — CBC WITH DIFFERENTIAL/PLATELET
Abs Immature Granulocytes: 0.02 10*3/uL (ref 0.00–0.07)
BASOS ABS: 0 10*3/uL (ref 0.0–0.1)
Basophils Relative: 1 %
EOS ABS: 0.3 10*3/uL (ref 0.0–0.5)
EOS PCT: 5 %
HEMATOCRIT: 38.5 % (ref 36.0–46.0)
Hemoglobin: 12.9 g/dL (ref 12.0–15.0)
Immature Granulocytes: 0 %
Lymphocytes Relative: 33 %
Lymphs Abs: 1.6 10*3/uL (ref 0.7–4.0)
MCH: 32.2 pg (ref 26.0–34.0)
MCHC: 33.5 g/dL (ref 30.0–36.0)
MCV: 96 fL (ref 80.0–100.0)
Monocytes Absolute: 0.6 10*3/uL (ref 0.1–1.0)
Monocytes Relative: 12 %
Neutro Abs: 2.4 10*3/uL (ref 1.7–7.7)
Neutrophils Relative %: 49 %
Platelets: 245 10*3/uL (ref 150–400)
RBC: 4.01 MIL/uL (ref 3.87–5.11)
RDW: 12.2 % (ref 11.5–15.5)
WBC: 4.9 10*3/uL (ref 4.0–10.5)
nRBC: 0 % (ref 0.0–0.2)

## 2018-09-29 LAB — URINALYSIS, ROUTINE W REFLEX MICROSCOPIC
BILIRUBIN URINE: NEGATIVE
Glucose, UA: NEGATIVE mg/dL
KETONES UR: NEGATIVE mg/dL
Leukocytes,Ua: NEGATIVE
Nitrite: NEGATIVE
PH: 7 (ref 5.0–8.0)
Protein, ur: 100 mg/dL — AB
SPECIFIC GRAVITY, URINE: 1.004 — AB (ref 1.005–1.030)

## 2018-09-29 LAB — BASIC METABOLIC PANEL
ANION GAP: 13 (ref 5–15)
BUN: 17 mg/dL (ref 8–23)
CO2: 22 mmol/L (ref 22–32)
Calcium: 10 mg/dL (ref 8.9–10.3)
Chloride: 106 mmol/L (ref 98–111)
Creatinine, Ser: 1.27 mg/dL — ABNORMAL HIGH (ref 0.44–1.00)
GFR calc Af Amer: 48 mL/min — ABNORMAL LOW (ref >60)
GFR calc non Af Amer: 41 mL/min — ABNORMAL LOW (ref >60)
Glucose, Bld: 101 mg/dL — ABNORMAL HIGH (ref 70–99)
Potassium: 4.1 mmol/L (ref 3.5–5.1)
Sodium: 141 mmol/L (ref 135–145)

## 2018-09-29 MED ORDER — MECLIZINE HCL 25 MG PO TABS
25.0000 mg | ORAL_TABLET | Freq: Once | ORAL | Status: AC
  Administered 2018-09-29: 25 mg via ORAL
  Filled 2018-09-29: qty 1

## 2018-09-29 NOTE — Telephone Encounter (Signed)
Tina Waller contacted me regarding pt  Spoke to [name] from [facility] regarding pt.   Pt is stating mildly dizziness bp is 200/100. Pt just took morning medication about 20 mins ago.   Pt states she took amlodipine last night around 10:15pm. [name] is wondering should pt take another dose of Norvasc.  I went to speak with [name] because Dr. Margarita Rana was in a room.   Per [name] pt will need to go to the ED because it has only been 12 hours since she has taken her Norvasc.  Informed [name] of [name] response he informed pt of our response and pt contacted daughter but was unable to reach her. Pt states she will contact the ambulance   Please f/u

## 2018-09-29 NOTE — ED Triage Notes (Signed)
Pt to ED for evaluation of dizziness onset 0700 today that is worse with movement. Recently seen for vertigo, prescribed meds but unable to fill medication yet. PT was out with pt this morning and recorded bp 779 systolic. Denies cp. Pt ambulatory.

## 2018-09-29 NOTE — Telephone Encounter (Signed)
Tried to reach pt on mobile phone.  No answer.  Looks like pt already in ED.

## 2018-09-29 NOTE — ED Provider Notes (Signed)
[place] EMERGENCY DEPARTMENT Provider Note   CSN: [id number] Arrival date & time: 09/29/18  1152    History   Chief Complaint Chief Complaint  Patient presents with  . Dizziness  . Hypertension    HPI Tina Waller is a 78 y.o. female with a past medical history of prior stroke requiring hospitalization from 3/3-3/4 with acute right cerebellar infarct causing dizziness presents to ED for ongoing dizziness and HTN. Dizziness described as room spinning around her when she tries to sit up or move. She was seen and evaluated in the ED on 3/16 for similar symptoms. Her dizziness improved with meclizine and antihypertensives given in ED. Symptoms then worsened last night and this morning. Reports intermittent blurry vision for the past several weeks. She had MRI done which was negative for acute abnormality. She was discharged home with rx for meclizine which she has not been able to get filled. She notes compliance with her home antihypertensive, asa and plavix. Denies injuries or falls, numbness in arms or legs, headache, vomiting, fever, URI sx or sinus congestion.      HPI  Past Medical History:  Diagnosis Date  . Anemia   . Anxiety   . Cataract   . Depression   . Diabetes mellitus without complication ([place])    pt denies  . Gout   . Hypertension   . Stroke [facility]) 2008  . Type 2 diabetes mellitus ([place]) 09/14/2018    Patient Active Problem List   Diagnosis Date Noted  . Microscopic hematuria 09/24/2018  . Tonsillar mass 09/24/2018  . Rectal bleeding 09/24/2018  . Insomnia 09/24/2018  . Hypertensive urgency 09/14/2018  . Anxiety 09/14/2018  . Acute CVA (cerebrovascular accident) ([place]) 09/14/2018  . Primary hyperparathyroidism ([place]) 02/10/2018  . Vitamin B 12 deficiency 12/11/2017  . Pulmonary hypertension, mild ([place]) 12/11/2017  . CKD (chronic kidney disease) stage 3, GFR 30-59 ml/min ([facility]) 04/17/2017  . Stasis dermatitis of both legs 04/17/2017   . Osteoporosis 12/22/2016  . Hx of gout 12/22/2016  . Estrogen deficiency 05/12/2016  . Vitamin D deficiency 05/12/2016  . Weight loss 05/12/2016  . HTN (hypertension) 06/12/2015  . History of CVA (cerebrovascular accident) 03/28/2015    Past Surgical History:  Procedure Laterality Date  . ABDOMINAL HYSTERECTOMY    . BUNIONECTOMY    . CATARACT EXTRACTION Left      OB History   No obstetric history on file.      Home Medications    Prior to Admission medications   Medication Sig Start Date End Date Taking? Authorizing Provider  amLODipine (NORVASC) 5 MG tablet Take 1 tablet (5 mg total) by mouth daily. Patient taking differently: Take 5 mg by mouth at bedtime.  09/24/18  Yes [name], MD  aspirin EC 81 MG tablet Take 81 mg by mouth daily.   Yes [provider]  clopidogrel (PLAVIX) 75 MG tablet Take 1 tablet (75 mg total) by mouth daily. 09/28/18  Yes [name], MD  Cyanocobalamin (VITAMIN B 12 PO) Take 1 tablet by mouth daily.   Yes [provider]  furosemide (LASIX) 40 MG tablet Take 1 tablet (40 mg total) by mouth daily. 09/28/18  Yes [name], MD  losartan (COZAAR) 100 MG tablet Take 1 tablet (100 mg total) by mouth daily. 05/27/18  Yes [name], MD  metoprolol succinate (TOPROL-XL) 25 MG 24 hr tablet Take 1 tab PO daily with a 50 mg tablet for a total  of 75 mg daily 09/28/18  Yes [name], MD  metoprolol succinate (TOPROL-XL) 50 MG 24 hr tablet Take 1 tablet (50 mg total) by mouth daily. Take with or immediately following a meal. 09/28/18  Yes [name], MD  polyethylene glycol [facility]) packet Take 17 g by mouth daily as needed for mild constipation. 09/15/18  Yes Hongalgi, Lenis Dickinson, MD  potassium chloride (KLOR-CON) 8 MEQ tablet Take 2 tablets (16 mEq total) by mouth daily. 07/21/18  Yes [name], MD  triamcinolone cream (KENALOG) 0.1 % Apply 1 application topically 2 (two) times daily as needed  (rash). 09/15/18  Yes Hongalgi, Lenis Dickinson, MD  VITAMIN D PO Take 1 tablet by mouth daily.   Yes [provider]  meclizine (ANTIVERT) 25 MG tablet Take 1 tablet (25 mg total) by mouth 3 (three) times daily as needed for dizziness. 09/27/18   Isla Pence, MD  Melatonin 3 MG TABS Take 1 tablet (3 mg total) by mouth at bedtime as needed. 09/24/18   [name], MD  pravastatin (PRAVACHOL) 10 MG tablet Take 0.5 tablets (5 mg total) by mouth daily. For high cholesterol 09/24/18   [name], MD    Family History Family History  Problem Relation Age of Onset  . Heart disease Mother   . Osteoporosis Neg Hx     Social History Social History   Tobacco Use  . Smoking status: Former Smoker    Last attempt to quit: 07/14/2006    Years since quitting: 12.2  . Smokeless tobacco: Never Used  Substance Use Topics  . Alcohol use: Yes    Alcohol/week: 7.0 standard drinks    Types: 7 Shots of liquor per week    Comment: 2020  " I drink twice a week "  . Drug use: No     Allergies   Lipitor [atorvastatin] and Lisinopril   Review of Systems Review of Systems  Constitutional: Negative for appetite change, chills and fever.  HENT: Negative for ear pain, rhinorrhea, sneezing and sore throat.   Eyes: Negative for photophobia and visual disturbance.  Respiratory: Negative for cough, chest tightness, shortness of breath and wheezing.   Cardiovascular: Negative for chest pain and palpitations.  Gastrointestinal: Negative for abdominal pain, blood in stool, constipation, diarrhea, nausea and vomiting.  Genitourinary: Negative for dysuria, hematuria and urgency.  Musculoskeletal: Negative for myalgias.  Skin: Negative for rash.  Neurological: Positive for dizziness. Negative for weakness and light-headedness.     Physical Exam Updated Vital Signs BP (!) 187/64   Pulse 61   Temp 98.3 F (36.8 C) (Oral)   Resp 14   Ht 5\' 9"  (1.753 m)   Wt 61.7 kg   SpO2 99%   BMI 20.08  kg/m   Physical Exam   ED Treatments / Results  Labs (all labs ordered are listed, but only abnormal results are displayed) Labs Reviewed  BASIC METABOLIC PANEL - Abnormal; Notable for the following components:      Result Value   Glucose, Bld 101 (*)    Creatinine, Ser 1.27 (*)    GFR calc non Af Amer 41 (*)    GFR calc Af Amer 48 (*)    All other components within normal limits  URINALYSIS, ROUTINE W REFLEX MICROSCOPIC - Abnormal; Notable for the following components:   Color, Urine STRAW (*)    Specific Gravity, Urine 1.004 (*)    Hgb urine dipstick SMALL (*)    Protein, ur 100 (*)  Bacteria, UA RARE (*)    All other components within normal limits  CBC WITH DIFFERENTIAL/PLATELET    EKG EKG Interpretation  Date/Time:  Wednesday September 29 2018 11:58:54 EDT Ventricular Rate:  68 PR Interval:    QRS Duration: 114 QT Interval:  402 QTC Calculation: 428 R Axis:   -46 Text Interpretation:  Sinus rhythm LAD, consider left anterior fascicular block Borderline ST depression, diffuse leads Abnormal T, consider ischemia, diffuse leads Confirmed by Gerlene Fee [phone]) on 09/29/2018 1:25:19 PM   Radiology Mr Brain W And Wo Contrast  Result Date: 09/27/2018 CLINICAL DATA:  Dizziness and hypertension.  Prior stroke. EXAM: MRI HEAD WITHOUT AND WITH CONTRAST TECHNIQUE: Multiplanar, multiecho pulse sequences of the brain and surrounding structures were obtained without and with intravenous contrast. CONTRAST:  6 mL Gadavist COMPARISON:  Head CT 09/27/2018 and MRI 09/14/2018 FINDINGS: Some sequences are mildly motion degraded. Brain: Restricted diffusion associated with the subcentimeter acute right cerebellar infarct on the prior MRI has resolved. No acute infarct is evident on today's study. There are chronic infarcts in the cerebellum, pons, left thalamus, right lateral lenticulostriate territory, and left occipital lobe. Patchy T2 hyperintensities elsewhere in the cerebral white  matter bilaterally are unchanged from the prior MRI and nonspecific but compatible with chronic small vessel ischemic disease. Cerebral atrophy is unchanged and mild for age. No intracranial hemorrhage, mass, midline shift, or extra-axial fluid collection is identified. No abnormal enhancement is seen. Vascular: Major intracranial vascular flow voids are preserved. Skull and upper cervical spine: Unremarkable bone marrow signal. Sinuses/Orbits: Right cataract extraction. Paranasal sinuses and mastoid air cells are clear. Other: None. IMPRESSION: 1. No acute intracranial abnormality. 2. Chronic small vessel ischemic disease with numerous chronic infarcts as above. Electronically Signed   By: Logan Bores M.D.   On: 09/27/2018 16:55    Procedures Procedures (including critical care time)  Medications Ordered in ED Medications  meclizine (ANTIVERT) tablet 25 mg (25 mg Oral Given 09/29/18 1233)     Initial Impression / Assessment and Plan / ED Course  I have reviewed the triage vital signs and the nursing notes.  Pertinent labs & imaging results that were available during my care of the patient were reviewed by me and considered in my medical decision making (see chart for details).        78 year old female presents to ED for ongoing dizziness and hypertension.  Seen and evaluated here 2 days ago with negative MRI.  She was hospitalized earlier in the month for stroke.  She was given a prescription for meclizine 2 days ago but had not gotten this filled.  She improved while she was in the ED but symptoms worsened last night and today.  Describes a sensation of the room spinning around her.  At baseline she is ambulatory with assistance. No deficits to neurological exam. She is alert and oriented to her baseline. No new symptoms other than what she experienced 2 days ago. CBC, BMP, UA unremarkable. I do not see any need for reimaging at this time because her last MRI showed no acute findings.  She  was given another dose of meclizine here and was able to ambulate without difficulty.  She is requesting discharge home.  I advised her to fill her meclizine as prescribed and to follow-up with her PCP.  Patient is hemodynamically stable, in NAD, and able to ambulate in the ED. Evaluation does not show pathology that would require ongoing emergent intervention or inpatient treatment. I explained the  diagnosis to the patient. Pain has been managed and has no complaints prior to discharge. Patient is comfortable with above plan and is stable for discharge at this time. All questions were answered prior to disposition. Strict return precautions for returning to the ED were discussed. Encouraged follow up with PCP.    Portions of this note were generated with [employer]. Dictation errors may occur despite best attempts at proofreading.   Final Clinical Impressions(s) / ED Diagnoses   Final diagnoses:  Vertigo    ED Discharge Orders    None       Delia Heady, PA-C 09/29/18 1353    Maudie Flakes, MD 09/29/18 1528

## 2018-09-29 NOTE — ED Notes (Signed)
Pt ambulated to bathroom without assistance 

## 2018-09-29 NOTE — Discharge Instructions (Signed)
Take your meclizine as directed. Follow-up with your primary care provider. Continue your other home medications as previously prescribed. Return to the ED if your symptoms worsen, you have a headache, blurry vision, numbness in arms or legs.

## 2018-09-29 NOTE — ED Notes (Signed)
Pt ambulated approximately 100 ft via 1-person assist. Pt SpO2 remained b/t 92-95% and HR remained b/t 75-80.

## 2018-09-29 NOTE — Telephone Encounter (Signed)
Home health is calling asking to speak to the nurse patient BP is  190/100BP

## 2018-09-30 ENCOUNTER — Telehealth: Payer: Self-pay | Admitting: Internal Medicine

## 2018-09-30 NOTE — Telephone Encounter (Signed)
Donn with [facility] called for verbal orders  To add 1 OT visit in order to complete goals

## 2018-09-30 NOTE — Telephone Encounter (Signed)
FYI   Returned [place] from [facility] call to give verbal orders

## 2018-10-01 ENCOUNTER — Telehealth: Payer: Self-pay | Admitting: Internal Medicine

## 2018-10-01 DIAGNOSIS — I129 Hypertensive chronic kidney disease with stage 1 through stage 4 chronic kidney disease, or unspecified chronic kidney disease: Secondary | ICD-10-CM | POA: Diagnosis not present

## 2018-10-01 DIAGNOSIS — J392 Other diseases of pharynx: Secondary | ICD-10-CM | POA: Diagnosis not present

## 2018-10-01 DIAGNOSIS — N183 Chronic kidney disease, stage 3 (moderate): Secondary | ICD-10-CM | POA: Diagnosis not present

## 2018-10-01 DIAGNOSIS — I69351 Hemiplegia and hemiparesis following cerebral infarction affecting right dominant side: Secondary | ICD-10-CM | POA: Diagnosis not present

## 2018-10-01 DIAGNOSIS — R42 Dizziness and giddiness: Secondary | ICD-10-CM | POA: Diagnosis not present

## 2018-10-01 DIAGNOSIS — E1122 Type 2 diabetes mellitus with diabetic chronic kidney disease: Secondary | ICD-10-CM | POA: Diagnosis not present

## 2018-10-01 NOTE — Telephone Encounter (Signed)
lemuel from [place] home health Missed visit due to pt refused physical therapy because she wanted to rest

## 2018-10-01 NOTE — Telephone Encounter (Signed)
Gave verbal orders. 

## 2018-10-01 NOTE — Telephone Encounter (Signed)
[place] called for OT orders patient request to move today visit next week for OT. She told OT she wants to rest because every time they came out they sent her to ER.

## 2018-10-04 ENCOUNTER — Telehealth: Payer: Self-pay

## 2018-10-04 DIAGNOSIS — I69351 Hemiplegia and hemiparesis following cerebral infarction affecting right dominant side: Secondary | ICD-10-CM | POA: Diagnosis not present

## 2018-10-04 DIAGNOSIS — E1122 Type 2 diabetes mellitus with diabetic chronic kidney disease: Secondary | ICD-10-CM | POA: Diagnosis not present

## 2018-10-04 DIAGNOSIS — J392 Other diseases of pharynx: Secondary | ICD-10-CM | POA: Diagnosis not present

## 2018-10-04 DIAGNOSIS — R42 Dizziness and giddiness: Secondary | ICD-10-CM | POA: Diagnosis not present

## 2018-10-04 DIAGNOSIS — N183 Chronic kidney disease, stage 3 (moderate): Secondary | ICD-10-CM | POA: Diagnosis not present

## 2018-10-04 DIAGNOSIS — I129 Hypertensive chronic kidney disease with stage 1 through stage 4 chronic kidney disease, or unspecified chronic kidney disease: Secondary | ICD-10-CM | POA: Diagnosis not present

## 2018-10-04 NOTE — Telephone Encounter (Signed)
Call placed to [facility] with SCAT who confirmed receipt of the SCAT application.

## 2018-10-06 DIAGNOSIS — I69351 Hemiplegia and hemiparesis following cerebral infarction affecting right dominant side: Secondary | ICD-10-CM | POA: Diagnosis not present

## 2018-10-06 DIAGNOSIS — J392 Other diseases of pharynx: Secondary | ICD-10-CM | POA: Diagnosis not present

## 2018-10-06 DIAGNOSIS — E1122 Type 2 diabetes mellitus with diabetic chronic kidney disease: Secondary | ICD-10-CM | POA: Diagnosis not present

## 2018-10-06 DIAGNOSIS — N183 Chronic kidney disease, stage 3 (moderate): Secondary | ICD-10-CM | POA: Diagnosis not present

## 2018-10-06 DIAGNOSIS — I129 Hypertensive chronic kidney disease with stage 1 through stage 4 chronic kidney disease, or unspecified chronic kidney disease: Secondary | ICD-10-CM | POA: Diagnosis not present

## 2018-10-06 DIAGNOSIS — R42 Dizziness and giddiness: Secondary | ICD-10-CM | POA: Diagnosis not present

## 2018-10-07 ENCOUNTER — Telehealth: Payer: Self-pay | Admitting: Internal Medicine

## 2018-10-07 DIAGNOSIS — I69351 Hemiplegia and hemiparesis following cerebral infarction affecting right dominant side: Secondary | ICD-10-CM | POA: Diagnosis not present

## 2018-10-07 DIAGNOSIS — E1122 Type 2 diabetes mellitus with diabetic chronic kidney disease: Secondary | ICD-10-CM | POA: Diagnosis not present

## 2018-10-07 DIAGNOSIS — R42 Dizziness and giddiness: Secondary | ICD-10-CM | POA: Diagnosis not present

## 2018-10-07 DIAGNOSIS — I129 Hypertensive chronic kidney disease with stage 1 through stage 4 chronic kidney disease, or unspecified chronic kidney disease: Secondary | ICD-10-CM | POA: Diagnosis not present

## 2018-10-07 DIAGNOSIS — J392 Other diseases of pharynx: Secondary | ICD-10-CM | POA: Diagnosis not present

## 2018-10-07 DIAGNOSIS — N183 Chronic kidney disease, stage 3 (moderate): Secondary | ICD-10-CM | POA: Diagnosis not present

## 2018-10-07 NOTE — Telephone Encounter (Signed)
Returned Tina Waller from [facility] and left a detailed vm informing Tina Waller of who I am and why I was calling and if she has any questions or concerns to give me a call

## 2018-10-07 NOTE — Telephone Encounter (Signed)
Deana with [facility] called for verbal orders for patient for: skilled nursing 2 week 1

## 2018-10-08 DIAGNOSIS — J392 Other diseases of pharynx: Secondary | ICD-10-CM | POA: Diagnosis not present

## 2018-10-08 DIAGNOSIS — N183 Chronic kidney disease, stage 3 (moderate): Secondary | ICD-10-CM | POA: Diagnosis not present

## 2018-10-08 DIAGNOSIS — R42 Dizziness and giddiness: Secondary | ICD-10-CM | POA: Diagnosis not present

## 2018-10-08 DIAGNOSIS — I129 Hypertensive chronic kidney disease with stage 1 through stage 4 chronic kidney disease, or unspecified chronic kidney disease: Secondary | ICD-10-CM | POA: Diagnosis not present

## 2018-10-08 DIAGNOSIS — E1122 Type 2 diabetes mellitus with diabetic chronic kidney disease: Secondary | ICD-10-CM | POA: Diagnosis not present

## 2018-10-08 DIAGNOSIS — I69351 Hemiplegia and hemiparesis following cerebral infarction affecting right dominant side: Secondary | ICD-10-CM | POA: Diagnosis not present

## 2018-10-12 DIAGNOSIS — I129 Hypertensive chronic kidney disease with stage 1 through stage 4 chronic kidney disease, or unspecified chronic kidney disease: Secondary | ICD-10-CM | POA: Diagnosis not present

## 2018-10-12 DIAGNOSIS — E1122 Type 2 diabetes mellitus with diabetic chronic kidney disease: Secondary | ICD-10-CM | POA: Diagnosis not present

## 2018-10-12 DIAGNOSIS — I69351 Hemiplegia and hemiparesis following cerebral infarction affecting right dominant side: Secondary | ICD-10-CM | POA: Diagnosis not present

## 2018-10-12 DIAGNOSIS — N183 Chronic kidney disease, stage 3 (moderate): Secondary | ICD-10-CM | POA: Diagnosis not present

## 2018-10-12 DIAGNOSIS — R42 Dizziness and giddiness: Secondary | ICD-10-CM | POA: Diagnosis not present

## 2018-10-12 DIAGNOSIS — J392 Other diseases of pharynx: Secondary | ICD-10-CM | POA: Diagnosis not present

## 2018-10-13 DIAGNOSIS — E1122 Type 2 diabetes mellitus with diabetic chronic kidney disease: Secondary | ICD-10-CM | POA: Diagnosis not present

## 2018-10-13 DIAGNOSIS — J392 Other diseases of pharynx: Secondary | ICD-10-CM | POA: Diagnosis not present

## 2018-10-13 DIAGNOSIS — I69351 Hemiplegia and hemiparesis following cerebral infarction affecting right dominant side: Secondary | ICD-10-CM | POA: Diagnosis not present

## 2018-10-13 DIAGNOSIS — I129 Hypertensive chronic kidney disease with stage 1 through stage 4 chronic kidney disease, or unspecified chronic kidney disease: Secondary | ICD-10-CM | POA: Diagnosis not present

## 2018-10-13 DIAGNOSIS — N183 Chronic kidney disease, stage 3 (moderate): Secondary | ICD-10-CM | POA: Diagnosis not present

## 2018-10-13 DIAGNOSIS — R42 Dizziness and giddiness: Secondary | ICD-10-CM | POA: Diagnosis not present

## 2018-10-14 ENCOUNTER — Encounter: Payer: Self-pay | Admitting: Internal Medicine

## 2018-10-14 ENCOUNTER — Inpatient Hospital Stay: Payer: Medicare HMO | Admitting: Internal Medicine

## 2018-10-14 ENCOUNTER — Other Ambulatory Visit: Payer: Self-pay | Admitting: Internal Medicine

## 2018-10-14 ENCOUNTER — Ambulatory Visit: Payer: 59 | Attending: Internal Medicine | Admitting: Internal Medicine

## 2018-10-14 ENCOUNTER — Other Ambulatory Visit: Payer: Self-pay

## 2018-10-14 VITALS — BP 178/80 | HR 76 | Temp 97.7°F | Resp 16 | Wt 132.0 lb

## 2018-10-14 DIAGNOSIS — N289 Disorder of kidney and ureter, unspecified: Secondary | ICD-10-CM

## 2018-10-14 DIAGNOSIS — R42 Dizziness and giddiness: Secondary | ICD-10-CM

## 2018-10-14 DIAGNOSIS — E7800 Pure hypercholesterolemia, unspecified: Secondary | ICD-10-CM

## 2018-10-14 DIAGNOSIS — I10 Essential (primary) hypertension: Secondary | ICD-10-CM

## 2018-10-14 DIAGNOSIS — Z8673 Personal history of transient ischemic attack (TIA), and cerebral infarction without residual deficits: Secondary | ICD-10-CM

## 2018-10-14 DIAGNOSIS — R220 Localized swelling, mass and lump, head: Secondary | ICD-10-CM

## 2018-10-14 DIAGNOSIS — J358 Other chronic diseases of tonsils and adenoids: Secondary | ICD-10-CM

## 2018-10-14 DIAGNOSIS — K625 Hemorrhage of anus and rectum: Secondary | ICD-10-CM

## 2018-10-14 MED ORDER — METOPROLOL SUCCINATE ER 50 MG PO TB24: 100.0000 mg | ORAL_TABLET | Freq: Every day | ORAL | 2 refills | Status: DC

## 2018-10-14 NOTE — Telephone Encounter (Signed)
1) Medication(s) Requested (by name): clopidogrel 2) Pharmacy of Choice: [facility]  3) Special Requests:   Approved medications will be sent to the pharmacy, we will reach out if there is an issue.  Requests made after 3pm may not be addressed until the following business day!  If a patient is unsure of the name of the medication(s) please note and ask patient to call back when they are able to provide all info, do not send to responsible party until all information is available!

## 2018-10-14 NOTE — Patient Instructions (Signed)
Increase metoprolol to 100 mg daily.  This means you should take to 50 mg tablets daily.  Please continue to check your blood pressure daily and pulse rate daily.  If you find that your pulse rate is falling below 60 please let me know.  Please keep your upcoming appointment with the gastroenterologist at [facility] Dr. Loletha Carrow.  I will have our referral coordinator check into the appointment for you to see the ear nose and throat specialist for the tonsillar mass that was seen on CAT scan.

## 2018-10-14 NOTE — Progress Notes (Signed)
Patient ID: Tina Waller, female    DOB: Jul 19, 1940  MRN: [id number]  CC: Hospitalization Follow-up (ED)   Subjective: Tina Waller is a 78 y.o. female who presents for follow-up from the emergency room. Her concerns today include:  Patient with history ofHTN,HL, vit D deficiency, osteoporosis on Dexascandone fall 2017, primary hyperparathyroidism,  depression,CVA in 2008 and 09/2018 and gout.  Since last visit with me about 2 weeks ago, patient was seen in the emergency room x2 for elevated blood pressure and dizziness.  Repeat MRI of the head done on ER visit 09/27/2018 was negative for any new events.  She was discharged home with meclizine which she did not get filled.  She presented to the ED again on 09/29/2018 with dizziness and blood pressure of 187/64.  CBC was within normal limits, UA negative and BMP revealed creatinine of 1.27 with estimated GFR of 48.  Patient has been followed at home by [facility].  She completed home P.T yesterday and OT completed last wk.  Has one more nurse visit tomorrow.  Dizziness:  Used Meclizine once since seen in ER 09/29/2018.  No dizziness in past 1 wk. No falls.  She felt she has benefited from PT and OT.  Creatinine has been at 1.2 range over the past several weeks with GFR in the 40s to 50s.  Reports that appetite is good.  She is drinking several glasses of water and sweet tea during the day.  HTN:  BP elevated this a.m. Has not taken Metoprolol and Furosemide as yet for the morning. Takes Norvasc at nights.   Has log of BP readings with her today.  Systolic blood pressure for the most part has been between 140s to 160s with several highs in the 170s to 180 range.  Diastolic blood pressure has been 70s to 80s.  Home blood pressure reading this morning was 165/78.  Pulse has been in the 80 upper 70s to 80 No CP/SOB/LE edema  HL:  Did get Pravachol and tolerating it so far.   In terms of appointments with specialists, she  has an upcoming appointment with [facility] for evaluation of rectal bleeding.  Her appointment with the neurologist Dr. Leonie Man is scheduled for June.  She has not received appointment as yet for ENT to evaluate the right tonsillar mass seen on CAT scan during recent hospitalization.  I do see in the system that the referral was submitted to Dr. Benjamine Mola and they were supposed to call the patient to schedule the appointment.   Patient Active Problem List   Diagnosis Date Noted  . Microscopic hematuria 09/24/2018  . Tonsillar mass 09/24/2018  . Rectal bleeding 09/24/2018  . Insomnia 09/24/2018  . Hypertensive urgency 09/14/2018  . Anxiety 09/14/2018  . Acute CVA (cerebrovascular accident) ([place]) 09/14/2018  . Primary hyperparathyroidism ([place]) 02/10/2018  . Vitamin B 12 deficiency 12/11/2017  . Pulmonary hypertension, mild ([place]) 12/11/2017  . CKD (chronic kidney disease) stage 3, GFR 30-59 ml/min (HCC) 04/17/2017  . Stasis dermatitis of both legs 04/17/2017  . Osteoporosis 12/22/2016  . Hx of gout 12/22/2016  . Estrogen deficiency 05/12/2016  . Vitamin D deficiency 05/12/2016  . Weight loss 05/12/2016  . HTN (hypertension) 06/12/2015  . History of CVA (cerebrovascular accident) 03/28/2015     Current Outpatient Medications on File Prior to Visit  Medication Sig Dispense Refill  . amLODipine (NORVASC) 5 MG tablet Take 1 tablet (5 mg total) by mouth daily. (Patient taking differently: Take  5 mg by mouth at bedtime. ) 90 tablet 3  . aspirin EC 81 MG tablet Take 81 mg by mouth daily.    . clopidogrel (PLAVIX) 75 MG tablet Take 1 tablet (75 mg total) by mouth daily. 90 tablet 0  . Cyanocobalamin (VITAMIN B 12 PO) Take 1 tablet by mouth daily.    . furosemide (LASIX) 40 MG tablet Take 1 tablet (40 mg total) by mouth daily. 90 tablet 1  . losartan (COZAAR) 100 MG tablet Take 1 tablet (100 mg total) by mouth daily. 90 tablet 3  . meclizine (ANTIVERT) 25 MG tablet Take 1 tablet (25 mg total) by  mouth 3 (three) times daily as needed for dizziness. 30 tablet 0  . Melatonin 3 MG TABS Take 1 tablet (3 mg total) by mouth at bedtime as needed. 60 tablet 0  . polyethylene glycol (MIRALAX) packet Take 17 g by mouth daily as needed for mild constipation.    . potassium chloride (KLOR-CON) 8 MEQ tablet Take 2 tablets (16 mEq total) by mouth daily. 180 tablet 1  . pravastatin (PRAVACHOL) 10 MG tablet Take 0.5 tablets (5 mg total) by mouth daily. For high cholesterol 30 tablet 1  . triamcinolone cream (KENALOG) 0.1 % Apply 1 application topically 2 (two) times daily as needed (rash).    Marland Kitchen VITAMIN D PO Take 1 tablet by mouth daily.     No current facility-administered medications on file prior to visit.     Allergies  Allergen Reactions  . Lipitor [Atorvastatin] Diarrhea  . Lisinopril Cough    Social History   Socioeconomic History  . Marital status: Divorced    Spouse name: Not on file  . Number of children: Not on file  . Years of education: Not on file  . Highest education level: Not on file  Occupational History  . Not on file  Social Needs  . Financial resource strain: Not on file  . Food insecurity:    Worry: Not on file    Inability: Not on file  . Transportation needs:    Medical: Not on file    Non-medical: Not on file  Tobacco Use  . Smoking status: Former Smoker    Last attempt to quit: 07/14/2006    Years since quitting: 12.2  . Smokeless tobacco: Never Used  Substance and Sexual Activity  . Alcohol use: Yes    Alcohol/week: 7.0 standard drinks    Types: 7 Shots of liquor per week    Comment: 2020  " I drink twice a week "  . Drug use: No  . Sexual activity: Not on file  Lifestyle  . Physical activity:    Days per week: Not on file    Minutes per session: Not on file  . Stress: Not on file  Relationships  . Social connections:    Talks on phone: Not on file    Gets together: Not on file    Attends religious service: Not on file    Active member of club  or organization: Not on file    Attends meetings of clubs or organizations: Not on file    Relationship status: Not on file  . Intimate partner violence:    Fear of current or ex partner: Not on file    Emotionally abused: Not on file    Physically abused: Not on file    Forced sexual activity: Not on file  Other Topics Concern  . Not on file  Social History Narrative  .  Not on file    Family History  Problem Relation Age of Onset  . Heart disease Mother   . Osteoporosis Neg Hx     Past Surgical History:  Procedure Laterality Date  . ABDOMINAL HYSTERECTOMY    . BUNIONECTOMY    . CATARACT EXTRACTION Left     ROS: Review of Systems  Constitutional: Negative for activity change and appetite change.   Negative except as stated above  PHYSICAL EXAM: BP (!) 178/80   Pulse 76   Temp 97.7 F (36.5 C) (Oral)   Resp 16   Wt 132 lb (59.9 kg)   SpO2 96%   BMI 19.49 kg/m   Wt Readings from Last 3 Encounters:  10/14/18 132 lb (59.9 kg)  09/29/18 136 lb (61.7 kg)  09/27/18 135 lb 9.6 oz (61.5 kg)  Repeat blood pressure was 178/80 Physical Exam  General appearance - alert, slightly frail elderly African-American female in no distress.   Mental status -patient exhibits some slowness and recall of information.  She is oriented and answers questions appropriately Mouth -oral mucosa is moist Neck - supple, no significant adenopathy Chest -breath sounds mildly decreased bilaterally without crackles or rhonchi  heart -regular rate and rhythm without gallops or murmurs Extremities -trace to 1+ ankle edema.  Mild hyperpigmentation/stasis changes in the lower one third of both legs   CMP Latest Ref Rng & Units 09/29/2018 09/27/2018 09/24/2018  Glucose 70 - 99 mg/dL 101(H) 108(H) 104(H)  BUN 8 - 23 mg/dL 17 18 20   Creatinine 0.44 - 1.00 mg/dL 1.27(H) 1.25(H) 1.20(H)  Sodium 135 - 145 mmol/L 141 137 142  Potassium 3.5 - 5.1 mmol/L 4.1 5.0 4.8  Chloride 98 - 111 mmol/L 106 106 104   CO2 22 - 32 mmol/L 22 25 23   Calcium 8.9 - 10.3 mg/dL 10.0 9.5 9.9  Total Protein 6.5 - 8.1 g/dL - 6.9 -  Total Bilirubin 0.3 - 1.2 mg/dL - 0.7 -  Alkaline Phos 38 - 126 U/L - 114 -  AST 15 - 41 U/L - 23 -  ALT 0 - 44 U/L - 11 -   Lipid Panel     Component Value Date/Time   CHOL 215 (H) 09/14/2018 0511   TRIG 76 09/14/2018 0511   HDL 41 09/14/2018 0511   CHOLHDL 5.2 09/14/2018 0511   VLDL 15 09/14/2018 0511   LDLCALC 159 (H) 09/14/2018 0511    CBC    Component Value Date/Time   WBC 4.9 09/29/2018 1218   RBC 4.01 09/29/2018 1218   HGB 12.9 09/29/2018 1218   HGB 12.2 09/24/2018 1228   HCT 38.5 09/29/2018 1218   HCT 34.8 09/24/2018 1228   PLT 245 09/29/2018 1218   PLT 284 09/24/2018 1228   MCV 96.0 09/29/2018 1218   MCV 90 09/24/2018 1228   MCH 32.2 09/29/2018 1218   MCHC 33.5 09/29/2018 1218   RDW 12.2 09/29/2018 1218   RDW 11.4 (L) 09/24/2018 1228   LYMPHSABS 1.6 09/29/2018 1218   LYMPHSABS 1.0 11/12/2016 1132   MONOABS 0.6 09/29/2018 1218   EOSABS 0.3 09/29/2018 1218   EOSABS 0.1 11/12/2016 1132   BASOSABS 0.0 09/29/2018 1218   BASOSABS 0.0 11/12/2016 1132    ASSESSMENT AND PLAN: 1. Dizziness 2. History of cerebrovascular accident (CVA) involving cerebellum -No further dizziness which likely was due to the recent cerebellar infarct.  We also need to keep an eye on her renal function given that she is on a diuretic to make sure  that she does not get dehydrated.  3. Essential hypertension Blood pressure readings at home are better than what we get here in the office but still not at goal.  We will have her increase metoprolol from 75 mg daily to 100 mg daily.  She currently has a good supply of the 50 mg and 25 mg tablet of metoprolol at home.  I have instructed her to use the 50 mg tablets at first and then the 25 mg tablets.  Provided she tolerates the 100 mg dose and once she has depleted her supply, I will send prescription to her pharmacy for the 100 mg  tablet -I have put this information in bold on her discharge summary sheet so that she can show it to the visiting nurse tomorrow who fills her med box  4. Pure hypercholesterolemia Tolerating low dose of Pravachol.  I went ahead and increase the dose to 10 mg daily.  5. Renal insufficiency - Basic Metabolic Panel  6. Tonsillar mass Message sent to our referral specialist to follow-up on the ENT referral that was admitted  7. Rectal bleeding She will keep appointment with [facility]   Patient was given the opportunity to ask questions.  Patient verbalized understanding of the plan and was able to repeat key elements of the plan.   Orders Placed This Encounter  Procedures  . Basic Metabolic Panel     Requested Prescriptions   Signed Prescriptions Disp Refills  . metoprolol succinate (TOPROL-XL) 50 MG 24 hr tablet 60 tablet 2    Sig: Take 2 tablets (100 mg total) by mouth daily. Take with or immediately following a meal.    Keep scheduled follow-up appointment with me in 1 month Karle Plumber, MD, Rosalita Chessman

## 2018-10-15 ENCOUNTER — Telehealth: Payer: Self-pay

## 2018-10-15 ENCOUNTER — Other Ambulatory Visit: Payer: Self-pay

## 2018-10-15 ENCOUNTER — Telehealth: Payer: Self-pay | Admitting: Internal Medicine

## 2018-10-15 LAB — BASIC METABOLIC PANEL
BUN/Creatinine Ratio: 18 (ref 12–28)
BUN: 20 mg/dL (ref 8–27)
CO2: 22 mmol/L (ref 20–29)
Calcium: 9.7 mg/dL (ref 8.7–10.3)
Chloride: 100 mmol/L (ref 96–106)
Creatinine, Ser: 1.1 mg/dL — ABNORMAL HIGH (ref 0.57–1.00)
GFR calc Af Amer: 57 mL/min/{1.73_m2} — ABNORMAL LOW (ref >59)
GFR calc non Af Amer: 49 mL/min/{1.73_m2} — ABNORMAL LOW (ref >59)
Glucose: 106 mg/dL — ABNORMAL HIGH (ref 65–99)
Potassium: 4.3 mmol/L (ref 3.5–5.2)
Sodium: 139 mmol/L (ref 134–144)

## 2018-10-15 MED ORDER — CLOPIDOGREL BISULFATE 75 MG PO TABS: 75.0000 mg | ORAL_TABLET | Freq: Every day | ORAL | 1 refills | Status: DC

## 2018-10-15 NOTE — Telephone Encounter (Signed)
RX sent to patients mailorder pharmacy on 09/28/18.

## 2018-10-15 NOTE — Telephone Encounter (Signed)
Contacted pt to go over lab results and to see how she is feeling. Pt is aware of results   Pt states she was feeling good before she took her bp medicine and after she took it she felt not herself

## 2018-10-15 NOTE — Telephone Encounter (Signed)
-----   Message from Ena Dawley sent at 10/15/2018  9:06 AM EDT ----- Kermit Balo Morning  Dr Wynetta Emery  I spoke to Dr Benjamine Mola office . They talked to Mrs Verley on 3-20 to schedule an appointment she said that she was going to call back in 3 weeks she was sick . ----- Message ----- From: [name], MD Sent: 10/14/2018  12:47 PM EDT To: Ena Dawley  Patient states she has not heard from ENT as yet for an appointment.  Can you please check into this.

## 2018-10-15 NOTE — Telephone Encounter (Signed)
Pt states that her bp this morning was 193/68. I asked pt what was her pulse pt states she didn't write it down this morning

## 2018-10-15 NOTE — Telephone Encounter (Signed)
Contacted pt to go over lab results. Pt states she called yesterday regarding her medication.   Pt states her daughter went to [facility] to pickup rx and they didn't have it  Pt doesn't use [facility]. Pt uses [facility] [place]  Resentt rx to [facility] for pt

## 2018-10-16 DIAGNOSIS — E1122 Type 2 diabetes mellitus with diabetic chronic kidney disease: Secondary | ICD-10-CM | POA: Diagnosis not present

## 2018-10-16 DIAGNOSIS — J392 Other diseases of pharynx: Secondary | ICD-10-CM | POA: Diagnosis not present

## 2018-10-16 DIAGNOSIS — R42 Dizziness and giddiness: Secondary | ICD-10-CM | POA: Diagnosis not present

## 2018-10-16 DIAGNOSIS — I6529 Occlusion and stenosis of unspecified carotid artery: Secondary | ICD-10-CM | POA: Diagnosis not present

## 2018-10-16 DIAGNOSIS — I69351 Hemiplegia and hemiparesis following cerebral infarction affecting right dominant side: Secondary | ICD-10-CM | POA: Diagnosis not present

## 2018-10-16 DIAGNOSIS — N183 Chronic kidney disease, stage 3 (moderate): Secondary | ICD-10-CM | POA: Diagnosis not present

## 2018-10-16 DIAGNOSIS — D631 Anemia in chronic kidney disease: Secondary | ICD-10-CM | POA: Diagnosis not present

## 2018-10-16 DIAGNOSIS — I129 Hypertensive chronic kidney disease with stage 1 through stage 4 chronic kidney disease, or unspecified chronic kidney disease: Secondary | ICD-10-CM | POA: Diagnosis not present

## 2018-10-16 DIAGNOSIS — E1151 Type 2 diabetes mellitus with diabetic peripheral angiopathy without gangrene: Secondary | ICD-10-CM | POA: Diagnosis not present

## 2018-10-16 NOTE — Telephone Encounter (Signed)
Tried to reach pt via phone today.  Phone rings with no answer.  Will try to reach her tomorrow or Monday.

## 2018-10-19 ENCOUNTER — Telehealth: Payer: Self-pay | Admitting: Internal Medicine

## 2018-10-19 NOTE — Telephone Encounter (Signed)
New Message   Deana form [facility] is wanting to know wjy the pt's plavix was denied and also to request 5 additional nursing visits

## 2018-10-19 NOTE — Telephone Encounter (Signed)
Returned [facility] and left a detailed vm giving her the additonal 5 nursing visits and informed her that she will need to contact the pharmacy regarding her denial for the plavix and if she has any questions or concerns to give me a call

## 2018-10-20 DIAGNOSIS — N183 Chronic kidney disease, stage 3 (moderate): Secondary | ICD-10-CM | POA: Diagnosis not present

## 2018-10-20 DIAGNOSIS — I6529 Occlusion and stenosis of unspecified carotid artery: Secondary | ICD-10-CM | POA: Diagnosis not present

## 2018-10-20 DIAGNOSIS — I69351 Hemiplegia and hemiparesis following cerebral infarction affecting right dominant side: Secondary | ICD-10-CM | POA: Diagnosis not present

## 2018-10-20 DIAGNOSIS — E1151 Type 2 diabetes mellitus with diabetic peripheral angiopathy without gangrene: Secondary | ICD-10-CM | POA: Diagnosis not present

## 2018-10-20 DIAGNOSIS — I129 Hypertensive chronic kidney disease with stage 1 through stage 4 chronic kidney disease, or unspecified chronic kidney disease: Secondary | ICD-10-CM | POA: Diagnosis not present

## 2018-10-20 DIAGNOSIS — J392 Other diseases of pharynx: Secondary | ICD-10-CM | POA: Diagnosis not present

## 2018-10-20 DIAGNOSIS — D631 Anemia in chronic kidney disease: Secondary | ICD-10-CM | POA: Diagnosis not present

## 2018-10-20 DIAGNOSIS — E1122 Type 2 diabetes mellitus with diabetic chronic kidney disease: Secondary | ICD-10-CM | POA: Diagnosis not present

## 2018-10-20 DIAGNOSIS — R42 Dizziness and giddiness: Secondary | ICD-10-CM | POA: Diagnosis not present

## 2018-10-20 NOTE — Telephone Encounter (Signed)
Called pt again today 10/20/2018 to check on her.  Mobile phone rings without answer.

## 2018-10-22 DIAGNOSIS — R42 Dizziness and giddiness: Secondary | ICD-10-CM | POA: Diagnosis not present

## 2018-10-22 DIAGNOSIS — N183 Chronic kidney disease, stage 3 (moderate): Secondary | ICD-10-CM | POA: Diagnosis not present

## 2018-10-22 DIAGNOSIS — E1151 Type 2 diabetes mellitus with diabetic peripheral angiopathy without gangrene: Secondary | ICD-10-CM | POA: Diagnosis not present

## 2018-10-22 DIAGNOSIS — D631 Anemia in chronic kidney disease: Secondary | ICD-10-CM | POA: Diagnosis not present

## 2018-10-22 DIAGNOSIS — I69351 Hemiplegia and hemiparesis following cerebral infarction affecting right dominant side: Secondary | ICD-10-CM | POA: Diagnosis not present

## 2018-10-22 DIAGNOSIS — I129 Hypertensive chronic kidney disease with stage 1 through stage 4 chronic kidney disease, or unspecified chronic kidney disease: Secondary | ICD-10-CM | POA: Diagnosis not present

## 2018-10-22 DIAGNOSIS — I6529 Occlusion and stenosis of unspecified carotid artery: Secondary | ICD-10-CM | POA: Diagnosis not present

## 2018-10-22 DIAGNOSIS — J392 Other diseases of pharynx: Secondary | ICD-10-CM | POA: Diagnosis not present

## 2018-10-22 DIAGNOSIS — E1122 Type 2 diabetes mellitus with diabetic chronic kidney disease: Secondary | ICD-10-CM | POA: Diagnosis not present

## 2018-10-28 DIAGNOSIS — N183 Chronic kidney disease, stage 3 (moderate): Secondary | ICD-10-CM | POA: Diagnosis not present

## 2018-10-28 DIAGNOSIS — I69351 Hemiplegia and hemiparesis following cerebral infarction affecting right dominant side: Secondary | ICD-10-CM | POA: Diagnosis not present

## 2018-10-28 DIAGNOSIS — E1122 Type 2 diabetes mellitus with diabetic chronic kidney disease: Secondary | ICD-10-CM | POA: Diagnosis not present

## 2018-10-28 DIAGNOSIS — I6529 Occlusion and stenosis of unspecified carotid artery: Secondary | ICD-10-CM | POA: Diagnosis not present

## 2018-10-28 DIAGNOSIS — J392 Other diseases of pharynx: Secondary | ICD-10-CM | POA: Diagnosis not present

## 2018-10-28 DIAGNOSIS — D631 Anemia in chronic kidney disease: Secondary | ICD-10-CM | POA: Diagnosis not present

## 2018-10-28 DIAGNOSIS — E1151 Type 2 diabetes mellitus with diabetic peripheral angiopathy without gangrene: Secondary | ICD-10-CM | POA: Diagnosis not present

## 2018-10-28 DIAGNOSIS — I129 Hypertensive chronic kidney disease with stage 1 through stage 4 chronic kidney disease, or unspecified chronic kidney disease: Secondary | ICD-10-CM | POA: Diagnosis not present

## 2018-10-28 DIAGNOSIS — R42 Dizziness and giddiness: Secondary | ICD-10-CM | POA: Diagnosis not present

## 2018-11-03 ENCOUNTER — Other Ambulatory Visit: Payer: Self-pay

## 2018-11-03 ENCOUNTER — Encounter: Payer: Self-pay | Admitting: Gastroenterology

## 2018-11-03 ENCOUNTER — Ambulatory Visit (INDEPENDENT_AMBULATORY_CARE_PROVIDER_SITE_OTHER): Payer: Medicare HMO | Admitting: Gastroenterology

## 2018-11-03 ENCOUNTER — Other Ambulatory Visit (INDEPENDENT_AMBULATORY_CARE_PROVIDER_SITE_OTHER): Payer: Medicare HMO

## 2018-11-03 ENCOUNTER — Telehealth: Payer: Self-pay

## 2018-11-03 VITALS — BP 152/82 | HR 69 | Temp 97.8°F | Ht 69.0 in | Wt 136.0 lb

## 2018-11-03 DIAGNOSIS — K921 Melena: Secondary | ICD-10-CM

## 2018-11-03 DIAGNOSIS — R221 Localized swelling, mass and lump, neck: Secondary | ICD-10-CM | POA: Diagnosis not present

## 2018-11-03 DIAGNOSIS — K529 Noninfective gastroenteritis and colitis, unspecified: Secondary | ICD-10-CM

## 2018-11-03 LAB — TSH: TSH: 1.84 u[IU]/mL (ref 0.35–4.50)

## 2018-11-03 NOTE — Patient Instructions (Signed)
If you are age 76 or older, your body mass index should be between 23-30. Your Body mass index is 20.08 kg/m. If this is out of the aforementioned range listed, please consider follow up with your Primary Care Provider.  If you are age 24 or younger, your body mass index should be between 19-25. Your Body mass index is 20.08 kg/m. If this is out of the aformentioned range listed, please consider follow up with your Primary Care Provider.   To help prevent the possible spread of infection to our patients, communities, and staff; we will be implementing the following measures:  As of now we are not allowing any visitors/family members to accompany you to any upcoming appointments with [facility]. If you have any concerns about this please contact our office to discuss prior to the appointment.   Your provider has requested that you go to the basement level for lab work before leaving today. Press "B" on the elevator. The lab is located at the first door on the left as you exit the elevator.  Imodium 2 tablets every morning  Call next week with an update. [phone] ask for Jacques Navy   It was a pleasure to see you today!  Dr. Loletha Carrow

## 2018-11-03 NOTE — Progress Notes (Signed)
**Tina Waller De-Identified via Obfuscation** Tina Tina Waller  Chief Complaint: Rectal bleeding  Subjective  History:  Screening colonoscopy with me March 2018: Poor preparation with extensive lavage performed to improve visualization, tortuous and redundant colon with challenging scope passage.  Diminutive adenomatous polyp removed.  No surveillance colonoscopy was recommended due to patient's age.  She was admitted to [facility] in early March with an acute ischemic cerebellar infarct.  Imaging also showed remote lacunar infarcts remote infarct in the left occipital lobe.  CTA of the head and neck also showed a 2.2 cm right tonsillar/pharyngeal mass for which she was referred for ENT evaluation.  Tina Tina Waller had been on aspirin at the time of admission, then Plavix was added, with a plan for DAPT for 3 months, then clopidogrel alone.  She then had 2 further ED visits on 316 and 318 for vertigo.  [facility], apparently has neurology follow-up scheduled for June, and the provider noted that the patient had still not been seen by ENT (Dr. Benjamine Mola). ROS: Cardiovascular:  no chest pain Respiratory: no dyspnea No neck/throat pain or dysphagia. Chronic gait unsteadiness, but she feels it is significantly improved since the stroke with PT.  She is now "graduated" from walker to cane. Remainder of systems negative except as above  The patient's Past Medical, Family and Social History were reviewed and are on file in the EMR. Past Medical History:  Diagnosis Date  . Anemia   . Anxiety   . Cataract   . Depression   . Diabetes mellitus without complication ([place])    pt denies  . Gout   . Hypertension   . Stroke [facility]) 2008  . Type 2 diabetes mellitus ([place]) 09/14/2018    Objective:  Med list reviewed  Current Outpatient Medications:  .  amLODipine (NORVASC) 5 MG tablet, Take 1 tablet (5 mg total) by mouth daily. (Patient taking differently: Take 5 mg  by mouth at bedtime. ), Disp: 90 tablet, Rfl: 3 .  aspirin EC 81 MG tablet, Take 81 mg by mouth daily., Disp: , Rfl:  .  clopidogrel (PLAVIX) 75 MG tablet, Take 1 tablet (75 mg total) by mouth daily., Disp: 90 tablet, Rfl: 1 .  Cyanocobalamin (VITAMIN B 12 PO), Take 1 tablet by mouth daily., Disp: , Rfl:  .  furosemide (LASIX) 40 MG tablet, Take 1 tablet (40 mg total) by mouth daily., Disp: 90 tablet, Rfl: 1 .  losartan (COZAAR) 100 MG tablet, Take 1 tablet (100 mg total) by mouth daily., Disp: 90 tablet, Rfl: 3 .  meclizine (ANTIVERT) 25 MG tablet, Take 1 tablet (25 mg total) by mouth 3 (three) times daily as needed for dizziness., Disp: 30 tablet, Rfl: 0 .  metoprolol succinate (TOPROL-XL) 50 MG 24 hr tablet, Take 2 tablets (100 mg total) by mouth daily. Take with or immediately following a meal., Disp: 60 tablet, Rfl: 2 .  polyethylene glycol (MIRALAX) packet, Take 17 g by mouth daily as needed for mild constipation., Disp: , Rfl:  .  potassium chloride (KLOR-CON) 8 MEQ tablet, Take 2 tablets (16 mEq total) by mouth daily., Disp: 180 tablet, Rfl: 1 .  pravastatin (PRAVACHOL) 10 MG tablet, Take 0.5 tablets (5 mg total) by mouth daily. For high cholesterol, Disp: 30 tablet, Rfl: 1 .  triamcinolone cream (KENALOG) 0.1 %, Apply 1 application topically 2 (two) times daily as needed (rash)., Disp: , Rfl:  .  VITAMIN D PO,  Take 1 tablet by mouth daily., Disp: , Rfl:   Primary care Tina Waller of 05/20/2018 indicates increase of losartan dose from 25 to 50 mg daily.  Vital signs in last 24 hrs: Vitals:   11/03/18 1022  BP: (!) 152/82  Pulse: 69  Temp: 97.8 F (36.6 C)    Physical Exam  Exam chaperoned by Magdalene River, CMA  HEENT: sclera anicteric, oral mucosa moist without lesions  Neck: supple, no thyromegaly, JVD or lymphadenopathy.  No palpable neck mass or adenopathy  Cardiac: RRR without murmurs, S1S2 heard, no peripheral edema  Pulm: clear to auscultation bilaterally, normal RR and effort  noted  Abdomen: soft, no tenderness, with active bowel sounds. No guarding or palpable hepatosplenomegaly.  Skin; warm and dry, no jaundice or rash Rectal: decreased resting and voluntary ST.  No tenderness, fissure, palpable internal lesion.  Copious semi-formed stool Anoscopy: limited by copious stool Perhaps small int HR, nothing prolapsing  Recent Labs:  CBC Latest Ref Rng & Units 09/29/2018 09/27/2018 09/24/2018  WBC 4.0 - 10.5 K/uL 4.9 4.5 4.4  Hemoglobin 12.0 - 15.0 g/dL 12.9 12.5 12.2  Hematocrit 36.0 - 46.0 % 38.5 38.0 34.8  Platelets 150 - 400 K/uL 245 257 284   CMP Latest Ref Rng & Units 10/14/2018 09/29/2018 09/27/2018  Glucose 65 - 99 mg/dL 106(H) 101(H) 108(H)  BUN 8 - 27 mg/dL 20 17 18   Creatinine 0.57 - 1.00 mg/dL 1.10(H) 1.27(H) 1.25(H)  Sodium 134 - 144 mmol/L 139 141 137  Potassium 3.5 - 5.2 mmol/L 4.3 4.1 5.0  Chloride 96 - 106 mmol/L 100 106 106  CO2 20 - 29 mmol/L 22 22 25   Calcium 8.7 - 10.3 mg/dL 9.7 10.0 9.5  Total Protein 6.5 - 8.1 g/dL - - 6.9  Total Bilirubin 0.3 - 1.2 mg/dL - - 0.7  Alkaline Phos 38 - 126 U/L - - 114  AST 15 - 41 U/L - - 23  ALT 0 - 44 U/L - - 11    No recent TSH (last normal 10/2017) She had significantly decreased TSH in April 2009, circumstances unknown.  @ASSESSMENTPLANBEGIN @ Assessment: Encounter Diagnoses  Name Primary?  . Hematochezia Yes  . Chronic diarrhea   . Neck mass     Chronic diarrhea for several months, difficult to characterize, without abdominal pain but with rectal bleeding.  It would seem the change in bowel habits bleeding are related.  It is unclear if the frequent bowel movements are causing benign anal rectal bleeding or if there is a more proximal mucosal process such as colitis or neoplasia.  Diarrhea would be an unusual symptom from a colon mass (colonoscopy with me in 2018, though preparation poor), and she does not have pain to suggest chronic inflammation/colitis.  She is a former smoker, and the  pharyngeal/neck mass seen on CT angiogram still has not had attention by ENT.  I am concerned about proceeding with a colonoscopy at this point due to the recent stroke, ongoing need for Plavix and the fact that it would have to be done in the hospital setting because of her recent stroke.  Diarrhea is a common side effect of ARB medicine, but it seems very doubtful that could be decreased in dose or stop at this point since she needs control of her difficult hypertension, especially in setting of recent CVA.   Plan:  TSH Will for ova and parasite and C. Difficile Imodium 2 tablets every morning.  She can take another 2 tablets later in the  day as needed.  She had occasionally been taking just 1 tablet if she had more than 3 or 4 BMs in a day, but that dosing is insufficient. I asked her to call us in a week with an update on symptoms.  If diarrhea is under control and this causes cessation of the bleeding, then that will be less concerning and perhaps colonoscopy may not be necessary at this juncture. We have placed a referral to [facility] along with the CT angiogram report.  The patient needs a fiberoptic exam in office.  Total time 45 minutes, over half spent face-to-face with patient in counseling and coordination of care.   Nelida Meuse III

## 2018-11-03 NOTE — Telephone Encounter (Signed)
New patient referral sent to ENT. Dr Melida Quitter. Records faxed on 11-03-2018.  Will await appointment info.

## 2018-11-04 DIAGNOSIS — I6529 Occlusion and stenosis of unspecified carotid artery: Secondary | ICD-10-CM | POA: Diagnosis not present

## 2018-11-04 DIAGNOSIS — D631 Anemia in chronic kidney disease: Secondary | ICD-10-CM | POA: Diagnosis not present

## 2018-11-04 DIAGNOSIS — E1122 Type 2 diabetes mellitus with diabetic chronic kidney disease: Secondary | ICD-10-CM | POA: Diagnosis not present

## 2018-11-04 DIAGNOSIS — I69351 Hemiplegia and hemiparesis following cerebral infarction affecting right dominant side: Secondary | ICD-10-CM | POA: Diagnosis not present

## 2018-11-04 DIAGNOSIS — J392 Other diseases of pharynx: Secondary | ICD-10-CM | POA: Diagnosis not present

## 2018-11-04 DIAGNOSIS — N183 Chronic kidney disease, stage 3 (moderate): Secondary | ICD-10-CM | POA: Diagnosis not present

## 2018-11-04 DIAGNOSIS — E1151 Type 2 diabetes mellitus with diabetic peripheral angiopathy without gangrene: Secondary | ICD-10-CM | POA: Diagnosis not present

## 2018-11-04 DIAGNOSIS — I129 Hypertensive chronic kidney disease with stage 1 through stage 4 chronic kidney disease, or unspecified chronic kidney disease: Secondary | ICD-10-CM | POA: Diagnosis not present

## 2018-11-04 DIAGNOSIS — R42 Dizziness and giddiness: Secondary | ICD-10-CM | POA: Diagnosis not present

## 2018-11-05 ENCOUNTER — Other Ambulatory Visit: Payer: Medicare HMO

## 2018-11-05 DIAGNOSIS — K921 Melena: Secondary | ICD-10-CM | POA: Diagnosis not present

## 2018-11-05 DIAGNOSIS — K529 Noninfective gastroenteritis and colitis, unspecified: Secondary | ICD-10-CM | POA: Diagnosis not present

## 2018-11-08 DIAGNOSIS — J392 Other diseases of pharynx: Secondary | ICD-10-CM | POA: Diagnosis not present

## 2018-11-08 DIAGNOSIS — J358 Other chronic diseases of tonsils and adenoids: Secondary | ICD-10-CM | POA: Diagnosis not present

## 2018-11-08 DIAGNOSIS — Z8673 Personal history of transient ischemic attack (TIA), and cerebral infarction without residual deficits: Secondary | ICD-10-CM | POA: Diagnosis not present

## 2018-11-08 LAB — OVA AND PARASITE EXAMINATION

## 2018-11-08 LAB — CLOSTRIDIUM DIFFICILE TOXIN B, QUALITATIVE, REAL-TIME PCR: Toxigenic C. Difficile by PCR: NOT DETECTED

## 2018-11-09 ENCOUNTER — Telehealth: Payer: Self-pay

## 2018-11-09 DIAGNOSIS — K529 Noninfective gastroenteritis and colitis, unspecified: Secondary | ICD-10-CM

## 2018-11-09 DIAGNOSIS — K921 Melena: Secondary | ICD-10-CM

## 2018-11-09 NOTE — Telephone Encounter (Signed)
Called and spoke to pt. She threw the collection cup away b/c she thought she could collect both samples in the same cup.  According to the lab, a new order must be placed bc the 1st one was released.  A new order was entered. Pt knows to go to the lab M - F, 8:00 to 4:00pm

## 2018-11-09 NOTE — Telephone Encounter (Signed)
-----   Message from Roetta Sessions, [place] sent at 11/09/2018  9:01 AM EDT ----- Regarding: O&P Dr. Loletha Carrow - FYI - the lab called to let us know that this pt did not bring back the stool sample to do the O&P you ordered, only the C-Diff collection cup.  We have tried several times to reach the patient to ask her to return the sample in the collection device provided for the O&P and will continue to do so.  Unfortunately she does not have VM set up so we just have to keep calling her.    Have a good one, Jan

## 2018-11-10 ENCOUNTER — Other Ambulatory Visit: Payer: Medicare HMO

## 2018-11-10 ENCOUNTER — Encounter: Payer: Self-pay | Admitting: Internal Medicine

## 2018-11-10 NOTE — Progress Notes (Addendum)
I received a call from Dr. Melida Quitter of ENT on 428/2020.  He had seen this patient in his office the day before to evaluate abnormal finding seen in the area of the right tonsil/pharynx on CT scan done last month.  He states that he was unable to have a good look at the back of the patient's throat because she gagged.  He would need to evaluate this under sedation and perform biopsy versus tonsillectomy.  At this time patient is asymptomatic without cervical lymphadenopathy but she is a former smoker.  He wanted to discuss the timing of this given patient history of recent recurrent stroke and the fact that she is on Plavix.  If he has to do a tonsillectomy she would need to be off of Plavix for 1 week prior to and for 2 to 3 weeks after the procedure.  If he only has to biopsy it, she would need to be off of Plavix for 1 week prior and then for 2 to 3 days after the procedure.  If she is only on a baby aspirin, the aspirin would not have to be stopped for him to do the procedure. He said he thinks the best option would be for him to do bx rather than tosillectomy.  He would like to do it in June. I told him that pt may have a c-scope soon for blood in stools.  Would be good if both can be scheduled around same time.  However, I would need to check in with neurology who she will see in June to make sure it is okay for her to be off Plavix in June.  She would be 3 mths out from her CVA by then.  I will get back to him after I speak with pt and neurology.  Pt has appt with me 11/15/2018.  Dr. Redmond Baseman office [phone] Cell [phone]

## 2018-11-11 DIAGNOSIS — I6529 Occlusion and stenosis of unspecified carotid artery: Secondary | ICD-10-CM | POA: Diagnosis not present

## 2018-11-11 DIAGNOSIS — I129 Hypertensive chronic kidney disease with stage 1 through stage 4 chronic kidney disease, or unspecified chronic kidney disease: Secondary | ICD-10-CM | POA: Diagnosis not present

## 2018-11-11 DIAGNOSIS — D631 Anemia in chronic kidney disease: Secondary | ICD-10-CM | POA: Diagnosis not present

## 2018-11-11 DIAGNOSIS — E1122 Type 2 diabetes mellitus with diabetic chronic kidney disease: Secondary | ICD-10-CM | POA: Diagnosis not present

## 2018-11-11 DIAGNOSIS — N183 Chronic kidney disease, stage 3 (moderate): Secondary | ICD-10-CM | POA: Diagnosis not present

## 2018-11-11 DIAGNOSIS — I69351 Hemiplegia and hemiparesis following cerebral infarction affecting right dominant side: Secondary | ICD-10-CM | POA: Diagnosis not present

## 2018-11-11 DIAGNOSIS — E1151 Type 2 diabetes mellitus with diabetic peripheral angiopathy without gangrene: Secondary | ICD-10-CM | POA: Diagnosis not present

## 2018-11-11 DIAGNOSIS — R42 Dizziness and giddiness: Secondary | ICD-10-CM | POA: Diagnosis not present

## 2018-11-11 DIAGNOSIS — J392 Other diseases of pharynx: Secondary | ICD-10-CM | POA: Diagnosis not present

## 2018-11-15 ENCOUNTER — Other Ambulatory Visit: Payer: Self-pay

## 2018-11-15 ENCOUNTER — Ambulatory Visit: Payer: Medicare HMO | Attending: Internal Medicine | Admitting: Internal Medicine

## 2018-11-15 DIAGNOSIS — K529 Noninfective gastroenteritis and colitis, unspecified: Secondary | ICD-10-CM

## 2018-11-15 DIAGNOSIS — Z8673 Personal history of transient ischemic attack (TIA), and cerebral infarction without residual deficits: Secondary | ICD-10-CM | POA: Diagnosis not present

## 2018-11-15 DIAGNOSIS — J358 Other chronic diseases of tonsils and adenoids: Secondary | ICD-10-CM

## 2018-11-15 DIAGNOSIS — R220 Localized swelling, mass and lump, head: Secondary | ICD-10-CM | POA: Diagnosis not present

## 2018-11-15 DIAGNOSIS — I10 Essential (primary) hypertension: Secondary | ICD-10-CM | POA: Diagnosis not present

## 2018-11-15 DIAGNOSIS — K921 Melena: Secondary | ICD-10-CM | POA: Diagnosis not present

## 2018-11-15 NOTE — Progress Notes (Signed)
Pt states she still has swelling in feet

## 2018-11-15 NOTE — Telephone Encounter (Signed)
Appointmwnt completed on 11-08-2018 with Dr Redmond Baseman

## 2018-11-15 NOTE — Progress Notes (Signed)
Virtual Visit via Telephone Note Due to current restrictions/limitations of in-office visits due to the COVID-19 pandemic, this scheduled clinical appointment was converted to a telehealth visit  I connected with Tina Waller on 11/15/18 at 1:53 p.m EDT by telephone from my office and verified that I am speaking with the correct person using two identifiers.  The patient is at home.  Only the patient myself participated in this encounter.   I discussed the limitations, risks, security and privacy concerns of performing an evaluation and management service by telephone and the availability of in person appointments. I also discussed with the patient that there may be a patient responsible charge related to this service. The patient expressed understanding and agreed to proceed.   History of Present Illness: Patient with history ofHTN,HL, vit D deficiency, osteoporosis on Dexascandone fall 2017,primary hyperparathyroidism,depression,CVA in 2008 and 09/2018 and gout.   Saw ENT Dr. Redmond Baseman recently.  Needs bx of RT tonsil/pharynx mass biopsy done.  Patient is willing to have this done.  We were trying to negotiate the timing of the procedure which would have to be done under sedation.  I did touch base with neurologist Dr. Leonie Man to inquire whether the patient can be off Plavix to have the procedure done in June given that she will be 3 months out from her recent CVA.  He states that she would be able to come off the Plavix and remain off of it at that point but continued on aspirin.  Rectal bleeding and chronic diarrhea:  Saw Dr. Loletha Carrow 11/03/2018.  He feels the ARB, Cozaar, may be contributing to her chronic diarrhea.  He ordered  O&P and C.diff.  Patient told him that she was taking 1 Imodium daily. He advised her to increase Imodium to 2 tabs daily/day.  She has noticed a dec in frequency of BMs for past 1 wk since doing so.  Reports she was having BM at least 5 times in the mornings; now only  once in mornings.  She has not noticed blood in stools or black stools.  Dr. Loletha Carrow concern about proceeding with colonoscopy given that patient is on Plavix and because the procedure would have to be done in a hospital setting.  He had colonoscopy done in 2018 though the preparation was poor.  He asked the patient to call him back in about a week to see whether the diarrhea improved and whether she is having any further rectal bleeding.  Patient states that she plans to call them back when she turns in the stool samples for the O&P and C. difficile.  HTN:  Check BP every morning.  Reports BP coming down.  This a.m was 152/68 P 65, yesterday was 148/61, P 68.  Other readings: 163/78, 140/76 -she limits salt in foods -reports compliance with meds Metoprolol 100 mg BID, Norvasc 5 mg -swelling in lower legs better but not completely resolve  Observations/Objective: Reported home blood pressure reading 152/68, pulse 65  Lab Results  Component Value Date   WBC 4.9 09/29/2018   HGB 12.9 09/29/2018   HCT 38.5 09/29/2018   MCV 96.0 09/29/2018   PLT 245 09/29/2018     Chemistry      Component Value Date/Time   NA 139 10/14/2018 0927   K 4.3 10/14/2018 0927   CL 100 10/14/2018 0927   CO2 22 10/14/2018 0927   BUN 20 10/14/2018 0927   CREATININE 1.10 (H) 10/14/2018 0927   CREATININE 1.35 (H) 04/21/2016 1437  Component Value Date/Time   CALCIUM 9.7 10/14/2018 0927   ALKPHOS 114 09/27/2018 1125   AST 23 09/27/2018 1125   ALT 11 09/27/2018 1125   BILITOT 0.7 09/27/2018 1125   BILITOT 0.7 05/27/2018 1629       Assessment and Plan: 1. Essential hypertension Blood pressure not at goal but certainly improved compared to previous visits.  Patient very sensitive to medications.  I do not feel stopping the Cozaar is an option at this point given that her blood pressure is so difficult to control.  She will continue the Cozaar, metoprolol 100 mg twice a day and Norvasc.  We do have room to  increase the Norvasc but I do not want to increase to 10 mg at this time because it does increase edema in her legs  2. Chronic diarrhea Encourage patient to turn in the stool samples as ordered by the gastroenterologist.  So far diarrhea has decreased with taking increased dose of Imodium  3. Hematochezia GI plan as outlined above in the history  4. Tonsillar mass Now that have spoken with the patient and she is willing to have the biopsy done, and I have touch base with the neurologist about the length of time she is to be on Plavix, I can now call Dr. Redmond Baseman back and let him know that he can move forward with scheduling her for mid June as we will plan to stop the Plavix at the beginning of June  5. History of cerebrovascular accident (CVA) involving cerebellum   Follow Up Instructions: F/u in 1 mth  I discussed the assessment and treatment plan with the patient. The patient was provided an opportunity to ask questions and all were answered. The patient agreed with the plan and demonstrated an understanding of the instructions.   The patient was advised to call back or seek an in-person evaluation if the symptoms worsen or if the condition fails to improve as anticipated.  I provided 18 minutes of non-face-to-face time during this encounter.   Karle Plumber, MD

## 2018-11-16 ENCOUNTER — Other Ambulatory Visit: Payer: Medicare HMO

## 2018-11-16 DIAGNOSIS — K921 Melena: Secondary | ICD-10-CM | POA: Diagnosis not present

## 2018-11-16 DIAGNOSIS — K529 Noninfective gastroenteritis and colitis, unspecified: Secondary | ICD-10-CM

## 2018-11-18 LAB — OVA AND PARASITE EXAMINATION
CONCENTRATE RESULT:: NONE SEEN
MICRO NUMBER:: 446265
SPECIMEN QUALITY:: ADEQUATE
TRICHROME RESULT:: NONE SEEN

## 2018-11-24 ENCOUNTER — Telehealth: Payer: Self-pay | Admitting: Internal Medicine

## 2018-11-24 NOTE — Telephone Encounter (Signed)
-----   Message from Garvin Fila, MD sent at 11/11/2018  3:47 PM EDT ----- Regarding: RE: Question Yes agree with plan to hold plavix 3 days prior to biopsy in June and not resume later and stay on aspirin alone ----- Message ----- From: [name], MD Sent: 11/10/2018   1:45 PM EDT To: Garvin Fila, MD Subject: Question                                       This pt is scheduled to see you in follow up 12/15/2018 for CVA for which she was hospitalized in early March.  She had CVA prior to that and was on ASA.  After her most recent CVA 09/2018, she was dischg on ASA and Plavix.   My question is in regards to the Plavix.  She was found to have an abnormal finding on CT involving RT tonsil. ENT saw her recently for the same and needs to do a biopsy or tonsillectomy under sedation to eval further.  He is thinking about doing it in June.  If he only does a biopsy, she will need to be off Plavix for 1 wk prior and for 3 days after.  If tonsillectomy done, she will need to be off Plavix for 2-3 weeks after.  He is leaning towards doing just biopsy first. In June, she would be about 3 mths out from her CVA.  Can we hold Plavix at that point for her to have this procedure done?  He said that if needed, she can stay on baby ASA for the biopsy.

## 2018-11-24 NOTE — Telephone Encounter (Signed)
PC placed to ENT Dr. Redmond Baseman.  I called the office phone number but was on hold for an extended period.  I called his cell phone and left a message letting him know that I did touch based with neurology regarding the pt (being this pt but I did not mention pt's name or DOB encase the cell# was incorrect) that we spoke about 2 wks ago who needs tonsillar bx.  I informed him that pt can stop Plavix 1 wk prior to procedure.  Request that he call me back on my cell phone to discuss more details.

## 2018-12-01 ENCOUNTER — Telehealth: Payer: Self-pay | Admitting: Internal Medicine

## 2018-12-01 ENCOUNTER — Other Ambulatory Visit: Payer: Self-pay | Admitting: Otolaryngology

## 2018-12-01 NOTE — Telephone Encounter (Signed)
Tina Waller with [facility] called stating that she needs clearence on how long the patient should hold her plavix before the surgery which is scheduled for June 1st Please follow up.

## 2018-12-01 NOTE — Telephone Encounter (Signed)
Will forward to provider  

## 2018-12-02 NOTE — Telephone Encounter (Signed)
Returned [place] call from Dr. Redmond Baseman office to go over Dr. Wynetta Emery response regarding pt. Left a detailed vm informing her of this message and if she can give me call to make aware that she received message  Also contacted pt and made her aware to stop plavix on 5.25.20.  Pt states she is taking asa 81mg 

## 2018-12-08 NOTE — Progress Notes (Signed)
[place] ([place]), [place] [facility] [place] [address] [place] ([place])  [address] Phone: [phone] Fax: [phone]  [facility] [place], [place] [address] Phone: [phone] Fax: [phone]      Your procedure is scheduled on June 1  Report to [facility] Main Entrance "A" at 0830 A.M., and check in at the Admitting office.  Call this number if you have problems the morning of surgery:  [phone]  Call [phone] if you have any questions prior to your surgery date Monday-Friday 8am-4pm    Remember:  Do not eat or drink after midnight.    Take these medicines the morning of surgery with A SIP OF WATER  acetaminophen (TYLENOL)  If needed meclizine (ANTIVERT) if needed metoprolol succinate (TOPROL-XL) pravastatin (PRAVACHOL)  7 days prior to surgery STOP taking any Aspirin (unless otherwise instructed by your surgeon), Aleve, Naproxen, Ibuprofen, Motrin, Advil, Goody's, BC's, all herbal medications, fish oil, and all vitamins.    The Morning of Surgery  Do not wear jewelry, make-up or nail polish.  Do not wear lotions, powders, or perfumes/colognes, or deodorant  Do not shave 48 hours prior to surgery.  Men may shave face and neck.  Do not bring valuables to the hospital.  [facility] is not responsible for any belongings or valuables.  If you are a smoker, DO NOT Smoke 24 hours prior to surgery IF you wear a CPAP at night please bring your mask, tubing, and machine the morning of surgery   Remember that you must have someone to transport you home after your surgery, and remain with you for 24 hours if you are discharged the same day.   Contacts, glasses, hearing aids, dentures or bridgework may not be worn into surgery.    Leave your suitcase in the car.  After surgery it may be brought to your room.  For patients admitted to the hospital, discharge time  will be determined by your treatment team.  Patients discharged the day of surgery will not be allowed to drive home.    Special instructions:   [place]- Preparing For Surgery  Before surgery, you can play an important role. Because skin is not sterile, your skin needs to be as free of germs as possible. You can reduce the number of germs on your skin by washing with CHG (chlorahexidine gluconate) Soap before surgery.  CHG is an antiseptic cleaner which kills germs and bonds with the skin to continue killing germs even after washing.    Oral Hygiene is also important to reduce your risk of infection.  Remember - BRUSH YOUR TEETH THE MORNING OF SURGERY WITH YOUR REGULAR TOOTHPASTE  Please do not use if you have an allergy to CHG or antibacterial soaps. If your skin becomes reddened/irritated stop using the CHG.  Do not shave (including legs and underarms) for at least 48 hours prior to first CHG shower. It is OK to shave your face.  Please follow these instructions carefully.   1. Shower the NIGHT BEFORE SURGERY and the MORNING OF SURGERY with CHG Soap.   2. If you chose to wash your hair, wash your hair first as usual with your normal shampoo.  3. After you shampoo, rinse your hair and body thoroughly to remove the shampoo.  4. Use CHG as you would any other liquid soap. You can apply CHG directly to the skin and wash  gently with a scrungie or a clean washcloth.   5. Apply the CHG Soap to your body ONLY FROM THE NECK DOWN.  Do not use on open wounds or open sores. Avoid contact with your eyes, ears, mouth and genitals (private parts). Wash Face and genitals (private parts)  with your normal soap.   6. Wash thoroughly, paying special attention to the area where your surgery will be performed.  7. Thoroughly rinse your body with warm water from the neck down.  8. DO NOT shower/wash with your normal soap after using and rinsing off the CHG Soap.  9. Pat yourself dry with a CLEAN  TOWEL.  10. Wear CLEAN PAJAMAS to bed the night before surgery, wear comfortable clothes the morning of surgery  11. Place CLEAN SHEETS on your bed the night of your first shower and DO NOT SLEEP WITH PETS.    Day of Surgery:  Do not apply any deodorants/lotions.  Please wear clean clothes to the hospital/surgery center.   Remember to brush your teeth WITH YOUR REGULAR TOOTHPASTE.   Please read over the following fact sheets that you were given.

## 2018-12-09 ENCOUNTER — Other Ambulatory Visit (HOSPITAL_COMMUNITY)
Admission: RE | Admit: 2018-12-09 | Discharge: 2018-12-09 | Disposition: A | Discharge status: Home / Self Care | Payer: Medicare HMO | Source: Ambulatory Visit | Attending: Otolaryngology | Admitting: Otolaryngology

## 2018-12-09 ENCOUNTER — Other Ambulatory Visit: Payer: Self-pay

## 2018-12-09 ENCOUNTER — Encounter (HOSPITAL_COMMUNITY)
Admission: RE | Admit: 2018-12-09 | Discharge: 2018-12-09 | Disposition: A | Discharge status: Home / Self Care | Payer: Medicare HMO | Source: Ambulatory Visit | Attending: Otolaryngology | Admitting: Otolaryngology

## 2018-12-09 ENCOUNTER — Encounter (HOSPITAL_COMMUNITY): Payer: Self-pay

## 2018-12-09 ENCOUNTER — Telehealth: Payer: Self-pay

## 2018-12-09 DIAGNOSIS — Z01812 Encounter for preprocedural laboratory examination: Secondary | ICD-10-CM | POA: Insufficient documentation

## 2018-12-09 DIAGNOSIS — Z1159 Encounter for screening for other viral diseases: Secondary | ICD-10-CM | POA: Insufficient documentation

## 2018-12-09 HISTORY — DX: Dizziness and giddiness: R42

## 2018-12-09 HISTORY — DX: Gastro-esophageal reflux disease without esophagitis: K21.9

## 2018-12-09 HISTORY — DX: Presence of dental prosthetic device (complete) (partial): Z97.2

## 2018-12-09 HISTORY — DX: Headache, unspecified: R51.9

## 2018-12-09 HISTORY — DX: Other chronic diseases of tonsils and adenoids: J35.8

## 2018-12-09 HISTORY — DX: Presence of spectacles and contact lenses: Z97.3

## 2018-12-09 LAB — BASIC METABOLIC PANEL
Anion gap: 9 (ref 5–15)
BUN: 34 mg/dL — ABNORMAL HIGH (ref 8–23)
CO2: 23 mmol/L (ref 22–32)
Calcium: 9.4 mg/dL (ref 8.9–10.3)
Chloride: 106 mmol/L (ref 98–111)
Creatinine, Ser: 1.62 mg/dL — ABNORMAL HIGH (ref 0.44–1.00)
GFR calc Af Amer: 36 mL/min — ABNORMAL LOW (ref >60)
GFR calc non Af Amer: 31 mL/min — ABNORMAL LOW (ref >60)
Glucose, Bld: 106 mg/dL — ABNORMAL HIGH (ref 70–99)
Potassium: 4.3 mmol/L (ref 3.5–5.1)
Sodium: 138 mmol/L (ref 135–145)

## 2018-12-09 LAB — CBC
HCT: 38.2 % (ref 36.0–46.0)
Hemoglobin: 12.7 g/dL (ref 12.0–15.0)
MCH: 30.7 pg (ref 26.0–34.0)
MCHC: 33.2 g/dL (ref 30.0–36.0)
MCV: 92.3 fL (ref 80.0–100.0)
Platelets: 226 10*3/uL (ref 150–400)
RBC: 4.14 MIL/uL (ref 3.87–5.11)
RDW: 12 % (ref 11.5–15.5)
WBC: 6.7 10*3/uL (ref 4.0–10.5)
nRBC: 0 % (ref 0.0–0.2)

## 2018-12-09 NOTE — Progress Notes (Signed)
Pt denies SOB, chest pain, and being under the care of a cardiologist. Pt denies having a stress test and cardiac cath. Pt denies having a chest x ray within the last year. Pt denies recent labs. Pt stated that her last dose of Plavix was "  5/25 "  as instructed. Pt stated that she was instructed to continue taking Aspirin. Pt denies that she and family members tested positive for COVID-19 ( pt scheduled to be tested today and reminded to quarantine). Pt denies that she and family members  experienced the following symptoms:  Cough yes/no: No Fever (>100.36F)  yes/no: No Runny nose yes/no: No Sore throat yes/no: No Difficulty breathing/shortness of breath  yes/no: No  Have you or a family member traveled in the last 14 days and where? yes/no: No  Pt reminded that hospital visitation restrictions are in effect and the importance of the restrictions.   Pt verbalized understanding of all pre-op instructions.  Pt chart forwarded to PA, Anesthesiology, to review history of recent CVA, abnormal EKG and labs (creatinine 1.62).

## 2018-12-09 NOTE — Telephone Encounter (Signed)
I called about her appt being change to video due to COVID 19. Pt requested I call her back because she was getting dress. Pt was tested for COVID 19 due to have a procedure done. Pt does not have a cell phone.

## 2018-12-09 NOTE — Progress Notes (Signed)
[place] (SE), [place] [facility] [place] [address] [place] ([place]) [place] [address] Phone: [phone] Fax: [phone]  [facility] - [place], [place] [address] Phone: [phone] Fax: [phone]      Your procedure is scheduled on Monday, December 13, 2018  Report to [facility] Main Entrance "A" at 0830 A.M., and check in at the Admitting office.  Call this number if you have problems the morning of surgery:  [phone]  Call [phone] if you have any questions prior to your surgery date Monday-Friday 8am-4pm    Remember:  Do not eat or drink after midnight Sunday, Dec 12, 2018    Take these medicines the morning of surgery with A SIP OF WATER  metoprolol succinate (TOPROL-XL) pravastatin (PRAVACHOL) acetaminophen (TYLENOL)  If needed meclizine (ANTIVERT) if needed  7 days prior to surgery STOP taking  Aleve, Naproxen, Ibuprofen, Motrin, Advil, Goody's, BC's, all herbal medications, fish oil, and all vitamins.    The Morning of Surgery  Do not wear jewelry, make-up or nail polish.  Do not wear lotions, powders, or perfumes/colognes, or deodorant  Do not shave 48 hours prior to surgery.  Do not bring valuables to the hospital.  [facility] is not responsible for any belongings or valuables.  If you are a smoker, DO NOT Smoke 24 hours prior to surgery IF you wear a CPAP at night please bring your mask, tubing, and machine the morning of surgery   Remember that you must have someone to transport you home after your surgery, and remain with you for 24 hours if you are discharged the same day.   Contacts, glasses, hearing aids, dentures or bridgework may not be worn into surgery.    Leave your suitcase in the car.  After surgery it may be brought to your room.  For patients admitted to the hospital, discharge time will be determined by your treatment team.  Patients  discharged the day of surgery will not be allowed to drive home.    Special instructions:   [place]- Preparing For Surgery  Before surgery, you can play an important role. Because skin is not sterile, your skin needs to be as free of germs as possible. You can reduce the number of germs on your skin by washing with CHG (chlorahexidine gluconate) Soap before surgery.  CHG is an antiseptic cleaner which kills germs and bonds with the skin to continue killing germs even after washing.    Oral Hygiene is also important to reduce your risk of infection.  Remember - BRUSH YOUR TEETH THE MORNING OF SURGERY WITH YOUR REGULAR TOOTHPASTE  Please do not use if you have an allergy to CHG or antibacterial soaps. If your skin becomes reddened/irritated stop using the CHG.  Do not shave (including legs and underarms) for at least 48 hours prior to first CHG shower. It is OK to shave your face.  Please follow these instructions carefully.   1. Shower the NIGHT BEFORE SURGERY and the MORNING OF SURGERY with CHG Soap.   2. If you chose to wash your hair, wash your hair first as usual with your normal shampoo.  3. After you shampoo, rinse your hair and body thoroughly to remove the shampoo.  4. Use CHG as you would any other liquid soap. You can apply CHG directly to the skin and wash gently with a scrungie or a clean washcloth.  5. Apply the CHG Soap to your body ONLY FROM THE NECK DOWN.  Do not use on open wounds or open sores. Avoid contact with your eyes, ears, mouth and genitals (private parts). Wash Face and genitals (private parts)  with your normal soap.   6. Wash thoroughly, paying special attention to the area where your surgery will be performed.  7. Thoroughly rinse your body with warm water from the neck down.  8. DO NOT shower/wash with your normal soap after using and rinsing off the CHG Soap.  9. Pat yourself dry with a CLEAN TOWEL.  10. Wear CLEAN PAJAMAS to bed the night before  surgery, wear comfortable clothes the morning of surgery  11. Place CLEAN SHEETS on your bed the night of your first shower and DO NOT SLEEP WITH PETS.    Day of Surgery:  Do not apply any deodorants/lotions.  Please wear clean clothes to the hospital/surgery center.   Remember to brush your teeth WITH YOUR REGULAR TOOTHPASTE.   Please read over the following fact sheets that you were given.

## 2018-12-10 ENCOUNTER — Telehealth: Payer: Self-pay | Admitting: Gastroenterology

## 2018-12-10 LAB — NOVEL CORONAVIRUS, NAA (HOSP ORDER, SEND-OUT TO REF LAB; TAT 18-24 HRS): SARS-CoV-2, NAA: NOT DETECTED

## 2018-12-10 NOTE — Progress Notes (Signed)
Anesthesia Chart Review:  Case:  [id number] Date/Time:  12/13/18 1015   Procedure:  TONSIL BIOPSY (Right )   Anesthesia type:  General   Pre-op diagnosis:  pharyngeal mass   Location:  [facility] ROOM 08 / [facility]   Surgeon:  Melida Quitter, MD      DISCUSSION: Patient is a 78 year old scheduled for the above procedure. She was admitted for small CVA on 09/14/18 with CT revealing an incidental right tonsillar/pharyngeal mass. She may require tonsillectomy in the future.  History includes former smoker (quit 2008), HTN, DM2, CVA (small right cerebellar infarct 09/14/18, left pontine infarct 01/2007), anemia, GERD.    Patient's PCP Dr. Wynetta Emery reached out to Dr. Leonie Man regarding surgery plans and perioperative Plavix and ASA instructions (see 11/24/18 telephone encounter by Dr. Wynetta Emery). He recommended holding Plavix for three days (and do not resume) and continuing ASA. Notes indicated Plavix would need to be held for 7 days from ENT standpoint. Last Plavix 12/06/18. She reported instructions to continue ASA.                                   Cr 1.62, up from ~ 1.20. She was last evaluated by Dr. Wynetta Emery on 11/15/18 for rectal bleeding and chronic diarrhea. She saw GI Dr. Loletha Carrow and was taking Imodium daily. He is wanting to do a colonoscopy once patient comes off her Plavix. Cozaar was thought to be contributing to her diarrhea, but PCP did not want to hold at that point given her difficult to control HTN. I will forward labs to [name], MD for follow-up.  Reviewed above including EKGs with anesthesiologist Nolon Nations, MD. Normal LVEF in 09/2018. If patient remains asymptomatic from a CV standpoint then it is anticipated that she can proceed with surgery. She denied chest pain and SOB at her PAT RN visit.    VS: BP 123/60   Pulse 70   Temp (!) 36.4 C   Resp 20   Ht 5\' 9"  (1.753 m)   Wt 61.6 kg   SpO2 98%   BMI 20.05 kg/m    PROVIDERS: [name], MD is PCP Antony Contras, MD is  neurologist Wilfrid Lund, MD is GI  LABS: Preoperative labs noted. Cr 1.62, previously 1.10-1.27 09/2018 (up to 1.69 11/2016 in the setting of gastroenteritis). (all labs ordered are listed, but only abnormal results are displayed)  Labs Reviewed  BASIC METABOLIC PANEL - Abnormal; Notable for the following components:      Result Value   Glucose, Bld 106 (*)    BUN 34 (*)    Creatinine, Ser 1.62 (*)    GFR calc non Af Amer 31 (*)    GFR calc Af Amer 36 (*)    All other components within normal limits  CBC    IMAGES: CTA neck 09/14/18: IMPRESSION: 1. Appearance suspicious for a 2.2 cm Right Tonsil/Pharyngeal Mass. Recommend ENT follow-up for direct visualization. No pathologically enlarged nodes, but there is a heterogeneous right level 2 lymph node seen on series 11, image 65. 2. Negative for large vessel occlusion, but positive for widespread atherosclerosis with significant stenoses: - Severe stenoses Right vertebral artery origin and V1 segment. - Severe stenosis Left vertebral V4 segment. - Severe stenosis of the bilateral ICA siphon cavernous segments. - up to Moderate stenosis Right PCA P2 segment, Left MCA M2, and bilateral MCA M3 branches. - Moderate to  Severe stenosis of the Right PCA inferior P3, Left ACA A3. 3. Stable CT appearance of the brain from earlier today. 4. Aortic Atherosclerosis (ICD10-I70.0) and Emphysema (ICD10-J43.9). - No significant stenosis of the bilateral ICA bulbs.   EKG: 09/29/18: Sinus rhythm LAD, consider left anterior fascicular block Borderline ST depression, diffuse leads Abnormal T, consider ischemia, diffuse leads Confirmed by Gerlene Fee [phone]) on 09/29/2018 1:25:19 PM - T wave inversion is new in V3 since 07/13/18 and negative T waves in V4-6 are more pronounced when compared to tracings dating back to 2014.   CV: Echo 09/14/18: IMPRESSIONS  1. The left ventricle has normal systolic function with an ejection fraction of 60-65%.  The cavity size was normal. There is moderately increased left ventricular wall thickness. Left ventricular diastolic Doppler parameters are consistent with impaired  relaxation Elevated mean left atrial pressure.  2. The right ventricle has normal systolic function. The cavity was normal. There is no increase in right ventricular wall thickness.  3. The mitral valve is normal in structure.  4. The tricuspid valve is normal in structure.  5. The aortic valve is normal in structure.  6. The pulmonic valve was normal in structure.  Renal US 02/08/18: IMPRESSION: 1. Negative for hemodynamically significant renal artery stenosis. 2. Small 4 mm nonobstructing stone in the right renal collecting system. 3. Bilateral simple renal cysts. 4. Echogenic renal parenchyma bilaterally consistent with underlying medical renal disease. 5.  Aortic Atherosclerosis (ICD10-170.0).   Past Medical History:  Diagnosis Date  . Anemia   . Anxiety   . Cataract   . Depression   . Diabetes mellitus without complication ([place])    pt denies  . GERD (gastroesophageal reflux disease)   . Gout   . Headache   . Hypertension   . Stroke [facility]) 2008   09/14/2018  . Tonsillar mass   . Type 2 diabetes mellitus ([place]) 09/14/2018  . Vertigo   . Wears dentures    upper  . Wears glasses     Past Surgical History:  Procedure Laterality Date  . ABDOMINAL HYSTERECTOMY    . BREAST SURGERY     lumpectomy  . BUNIONECTOMY    . CATARACT EXTRACTION Left   . COLONOSCOPY W/ BIOPSIES AND POLYPECTOMY    . MULTIPLE TOOTH EXTRACTIONS      MEDICATIONS: . acetaminophen (TYLENOL) 500 MG tablet  . amLODipine (NORVASC) 5 MG tablet  . aspirin EC 81 MG tablet  . cholecalciferol (VITAMIN D3) 25 MCG (1000 UT) tablet  . clopidogrel (PLAVIX) 75 MG tablet  . furosemide (LASIX) 40 MG tablet  . losartan (COZAAR) 100 MG tablet  . meclizine (ANTIVERT) 25 MG tablet  . metoprolol succinate (TOPROL-XL) 50 MG 24 hr tablet  . polyethylene  glycol (MIRALAX) packet  . potassium chloride (KLOR-CON) 8 MEQ tablet  . pravastatin (PRAVACHOL) 10 MG tablet  . triamcinolone cream (KENALOG) 0.1 %  . vitamin B-12 (CYANOCOBALAMIN) 1000 MCG tablet   No current facility-administered medications for this encounter.     Myra Gianotti, PA-C Surgical Short Stay/Anesthesiology [facility] Phone [phone] [facility] Phone [phone] 12/10/2018 12:44 PM

## 2018-12-10 NOTE — Telephone Encounter (Signed)
Dr.Danis wanted to try and set up a colon at the same time as the neck biopsy. However due to conflict in schedules this didn't work out.

## 2018-12-10 NOTE — Telephone Encounter (Signed)
Tina Waller from [place] stated that pt is scheduled for surgery but in her notes pt is to be scheduled for colonoscopy--need to coordinate care due to pt being on Plavix.  She leaves at 3:00 PM and requested a call by then.

## 2018-12-10 NOTE — Anesthesia Preprocedure Evaluation (Addendum)
Anesthesia Evaluation  Patient identified by MRN, date of birth, ID band Patient awake    Reviewed: Allergy & Precautions, NPO status , Patient's Chart, lab work & pertinent test results, reviewed documented beta blocker date and time   History of Anesthesia Complications Negative for: history of anesthetic complications  Airway Mallampati: I  TM Distance: >3 FB Neck ROM: Full    Dental  (+) Teeth Intact, Poor Dentition, Loose, Missing, Chipped, Dental Advisory Given   Pulmonary former smoker,    Pulmonary exam normal        Cardiovascular hypertension, Pt. on home beta blockers and Pt. on medications Normal cardiovascular exam   '20 TTE - EF 60-65%. There is moderately increased left ventricular wall thickness. Left ventricular diastolic Doppler parameters are consistent with impaired relaxation Elevated mean left atrial pressure.    Neuro/Psych  Headaches, PSYCHIATRIC DISORDERS Anxiety Depression  Vertigo  CVA    GI/Hepatic Neg liver ROS, GERD  ,  Endo/Other  diabetes, Type 2  Renal/GU CRFRenal disease     Musculoskeletal  Gout    Abdominal   Peds  Hematology negative hematology ROS (+) anemia ,   Anesthesia Other Findings Tonsillar mass   Reproductive/Obstetrics                          Anesthesia Physical Anesthesia Plan  ASA: III  Anesthesia Plan: General   Post-op Pain Management:    Induction: Intravenous  PONV Risk Score and Plan: 3 and Treatment may vary due to age or medical condition, Ondansetron and Dexamethasone  Airway Management Planned: Oral ETT  Additional Equipment: None  Intra-op Plan:   Post-operative Plan: Extubation in OR  Informed Consent: I have reviewed the patients History and Physical, chart, labs and discussed the procedure including the risks, benefits and alternatives for the proposed anesthesia with the patient or authorized representative who  has indicated his/her understanding and acceptance.       Plan Discussed with: CRNA and Surgeon  Anesthesia Plan Comments:       Anesthesia Quick Evaluation

## 2018-12-13 ENCOUNTER — Other Ambulatory Visit: Payer: Self-pay

## 2018-12-13 ENCOUNTER — Encounter (HOSPITAL_COMMUNITY): Payer: Self-pay | Admitting: General Practice

## 2018-12-13 ENCOUNTER — Ambulatory Visit (HOSPITAL_COMMUNITY)
Admission: RE | Admit: 2018-12-13 | Discharge: 2018-12-13 | Disposition: A | Discharge status: Home / Self Care | Payer: Medicare HMO | Attending: Otolaryngology | Admitting: Otolaryngology

## 2018-12-13 ENCOUNTER — Ambulatory Visit (HOSPITAL_COMMUNITY): Payer: Medicare HMO | Admitting: Vascular Surgery

## 2018-12-13 ENCOUNTER — Ambulatory Visit (HOSPITAL_COMMUNITY): Payer: Medicare HMO | Admitting: Certified Registered"

## 2018-12-13 ENCOUNTER — Telehealth: Payer: Self-pay | Admitting: Gastroenterology

## 2018-12-13 ENCOUNTER — Encounter (HOSPITAL_COMMUNITY)
Admission: RE | Disposition: A | Discharge status: Home / Self Care | Payer: Self-pay | Source: Home / Self Care | Attending: Otolaryngology

## 2018-12-13 DIAGNOSIS — K219 Gastro-esophageal reflux disease without esophagitis: Secondary | ICD-10-CM | POA: Insufficient documentation

## 2018-12-13 DIAGNOSIS — R599 Enlarged lymph nodes, unspecified: Secondary | ICD-10-CM | POA: Insufficient documentation

## 2018-12-13 DIAGNOSIS — Z888 Allergy status to other drugs, medicaments and biological substances status: Secondary | ICD-10-CM | POA: Insufficient documentation

## 2018-12-13 DIAGNOSIS — J392 Other diseases of pharynx: Secondary | ICD-10-CM | POA: Diagnosis not present

## 2018-12-13 DIAGNOSIS — Z87891 Personal history of nicotine dependence: Secondary | ICD-10-CM | POA: Diagnosis not present

## 2018-12-13 DIAGNOSIS — J358 Other chronic diseases of tonsils and adenoids: Secondary | ICD-10-CM | POA: Diagnosis not present

## 2018-12-13 DIAGNOSIS — Z79899 Other long term (current) drug therapy: Secondary | ICD-10-CM | POA: Diagnosis not present

## 2018-12-13 DIAGNOSIS — I129 Hypertensive chronic kidney disease with stage 1 through stage 4 chronic kidney disease, or unspecified chronic kidney disease: Secondary | ICD-10-CM | POA: Diagnosis not present

## 2018-12-13 DIAGNOSIS — Z7902 Long term (current) use of antithrombotics/antiplatelets: Secondary | ICD-10-CM | POA: Insufficient documentation

## 2018-12-13 DIAGNOSIS — E1122 Type 2 diabetes mellitus with diabetic chronic kidney disease: Secondary | ICD-10-CM | POA: Diagnosis not present

## 2018-12-13 DIAGNOSIS — Z8673 Personal history of transient ischemic attack (TIA), and cerebral infarction without residual deficits: Secondary | ICD-10-CM | POA: Diagnosis not present

## 2018-12-13 DIAGNOSIS — I10 Essential (primary) hypertension: Secondary | ICD-10-CM | POA: Diagnosis not present

## 2018-12-13 DIAGNOSIS — Z7982 Long term (current) use of aspirin: Secondary | ICD-10-CM | POA: Insufficient documentation

## 2018-12-13 DIAGNOSIS — E119 Type 2 diabetes mellitus without complications: Secondary | ICD-10-CM | POA: Insufficient documentation

## 2018-12-13 DIAGNOSIS — J351 Hypertrophy of tonsils: Secondary | ICD-10-CM | POA: Diagnosis not present

## 2018-12-13 DIAGNOSIS — N183 Chronic kidney disease, stage 3 (moderate): Secondary | ICD-10-CM | POA: Diagnosis not present

## 2018-12-13 HISTORY — PX: TONGUE BIOPSY: SHX6575

## 2018-12-13 SURGERY — BIOPSY, TONGUE
Anesthesia: General | Site: Throat | Laterality: Right

## 2018-12-13 MED ORDER — DEXAMETHASONE SODIUM PHOSPHATE 10 MG/ML IJ SOLN
INTRAMUSCULAR | Status: DC | PRN
  Administered 2018-12-13: 8 mg via INTRAVENOUS

## 2018-12-13 MED ORDER — PROPOFOL 10 MG/ML IV BOLUS
INTRAVENOUS | Status: DC | PRN
  Administered 2018-12-13: 100 mg via INTRAVENOUS

## 2018-12-13 MED ORDER — FENTANYL CITRATE (PF) 100 MCG/2ML IJ SOLN
INTRAMUSCULAR | Status: DC | PRN
  Administered 2018-12-13: 100 ug via INTRAVENOUS
  Administered 2018-12-13: 50 ug via INTRAVENOUS

## 2018-12-13 MED ORDER — LACTATED RINGERS IV SOLN
INTRAVENOUS | Status: DC
  Administered 2018-12-13: 09:00:00 via INTRAVENOUS

## 2018-12-13 MED ORDER — SUGAMMADEX SODIUM 200 MG/2ML IV SOLN
INTRAVENOUS | Status: DC | PRN
  Administered 2018-12-13: 40 mg via INTRAVENOUS
  Administered 2018-12-13: 200 mg via INTRAVENOUS

## 2018-12-13 MED ORDER — LIDOCAINE 2% (20 MG/ML) 5 ML SYRINGE
INTRAMUSCULAR | Status: AC
  Filled 2018-12-13: qty 5

## 2018-12-13 MED ORDER — ROCURONIUM BROMIDE 10 MG/ML (PF) SYRINGE
PREFILLED_SYRINGE | INTRAVENOUS | Status: DC | PRN
  Administered 2018-12-13: 70 mg via INTRAVENOUS

## 2018-12-13 MED ORDER — MIDAZOLAM HCL 2 MG/2ML IJ SOLN
INTRAMUSCULAR | Status: DC | PRN
  Administered 2018-12-13 (×2): 1 mg via INTRAVENOUS

## 2018-12-13 MED ORDER — DEXAMETHASONE SODIUM PHOSPHATE 10 MG/ML IJ SOLN
INTRAMUSCULAR | Status: AC
  Filled 2018-12-13: qty 1

## 2018-12-13 MED ORDER — ONDANSETRON HCL 4 MG/2ML IJ SOLN
INTRAMUSCULAR | Status: AC
  Filled 2018-12-13: qty 2

## 2018-12-13 MED ORDER — 0.9 % SODIUM CHLORIDE (POUR BTL) OPTIME
TOPICAL | Status: DC | PRN
  Administered 2018-12-13: 10:00:00 1000 mL

## 2018-12-13 MED ORDER — MEPERIDINE HCL 25 MG/ML IJ SOLN: 6.2500 mg | INTRAMUSCULAR | Status: DC | PRN

## 2018-12-13 MED ORDER — HYDROMORPHONE HCL 1 MG/ML IJ SOLN: 0.2500 mg | INTRAMUSCULAR | Status: DC | PRN

## 2018-12-13 MED ORDER — FENTANYL CITRATE (PF) 250 MCG/5ML IJ SOLN
INTRAMUSCULAR | Status: AC
  Filled 2018-12-13: qty 5

## 2018-12-13 MED ORDER — ROCURONIUM BROMIDE 10 MG/ML (PF) SYRINGE
PREFILLED_SYRINGE | INTRAVENOUS | Status: AC
  Filled 2018-12-13: qty 10

## 2018-12-13 MED ORDER — ONDANSETRON HCL 4 MG/2ML IJ SOLN
INTRAMUSCULAR | Status: DC | PRN
  Administered 2018-12-13: 4 mg via INTRAVENOUS

## 2018-12-13 MED ORDER — ONDANSETRON HCL 4 MG/2ML IJ SOLN: 4.0000 mg | Freq: Once | INTRAMUSCULAR | Status: DC | PRN

## 2018-12-13 MED ORDER — LIDOCAINE 2% (20 MG/ML) 5 ML SYRINGE
INTRAMUSCULAR | Status: DC | PRN
  Administered 2018-12-13: 100 mg via INTRAVENOUS

## 2018-12-13 MED ORDER — PROPOFOL 10 MG/ML IV BOLUS
INTRAVENOUS | Status: AC
  Filled 2018-12-13: qty 20

## 2018-12-13 MED ORDER — MIDAZOLAM HCL 2 MG/2ML IJ SOLN
INTRAMUSCULAR | Status: AC
  Filled 2018-12-13: qty 2

## 2018-12-13 SURGICAL SUPPLY — 30 items
CANISTER SUCT 3000ML PPV (MISCELLANEOUS) ×3 IMPLANT
CATH ROBINSON RED A/P 10FR (CATHETERS) IMPLANT
CLEANER TIP ELECTROSURG 2X2 (MISCELLANEOUS) ×3 IMPLANT
COAGULATOR SUCT 6 FR SWTCH (ELECTROSURGICAL) ×1
COAGULATOR SUCT SWTCH 10FR 6 (ELECTROSURGICAL) ×2 IMPLANT
COVER WAND RF STERILE (DRAPES) ×3 IMPLANT
CRADLE DONUT ADULT HEAD (MISCELLANEOUS) IMPLANT
ELECT COATED BLADE 2.86 ST (ELECTRODE) ×3 IMPLANT
ELECT REM PT RETURN 9FT ADLT (ELECTROSURGICAL) ×3
ELECT REM PT RETURN 9FT PED (ELECTROSURGICAL)
ELECTRODE REM PT RETRN 9FT PED (ELECTROSURGICAL) IMPLANT
ELECTRODE REM PT RTRN 9FT ADLT (ELECTROSURGICAL) ×1 IMPLANT
GAUZE 4X4 16PLY RFD (DISPOSABLE) ×3 IMPLANT
GLOVE BIO SURGEON STRL SZ7.5 (GLOVE) ×3 IMPLANT
GOWN STRL REUS W/ TWL LRG LVL3 (GOWN DISPOSABLE) ×2 IMPLANT
GOWN STRL REUS W/TWL LRG LVL3 (GOWN DISPOSABLE) ×4
KIT BASIN OR (CUSTOM PROCEDURE TRAY) ×3 IMPLANT
KIT TURNOVER KIT B (KITS) ×3 IMPLANT
NS IRRIG 1000ML POUR BTL (IV SOLUTION) ×3 IMPLANT
PACK SURGICAL SETUP 50X90 (CUSTOM PROCEDURE TRAY) ×3 IMPLANT
PAD ARMBOARD 7.5X6 YLW CONV (MISCELLANEOUS) IMPLANT
PENCIL BUTTON HOLSTER BLD 10FT (ELECTRODE) ×3 IMPLANT
SPECIMEN JAR SMALL (MISCELLANEOUS) IMPLANT
SPONGE TONSIL 1.25 RF SGL STRG (GAUZE/BANDAGES/DRESSINGS) ×3 IMPLANT
SYR BULB 3OZ (MISCELLANEOUS) ×3 IMPLANT
TOWEL OR 17X24 6PK STRL BLUE (TOWEL DISPOSABLE) ×3 IMPLANT
TUBE CONNECTING 12'X1/4 (SUCTIONS) ×1
TUBE CONNECTING 12X1/4 (SUCTIONS) ×2 IMPLANT
TUBE SALEM SUMP 16 FR W/ARV (TUBING) ×3 IMPLANT
YANKAUER SUCT BULB TIP NO VENT (SUCTIONS) ×3 IMPLANT

## 2018-12-13 NOTE — Discharge Instructions (Signed)

## 2018-12-13 NOTE — Op Note (Signed)
NAME: Tina Waller, Tina Waller MEDICAL RECORD [id number] ACCOUNT [id number] DATE OF BIRTH:Dec 02, 1940 FACILITY: [facility] LOCATION: [facility] PHYSICIAN:Lanae Federer Guido Sander, MD  OPERATIVE REPORT  DATE OF PROCEDURE:  12/13/2018  PREOPERATIVE DIAGNOSIS:  Right tonsil mass.  POSTOPERATIVE DIAGNOSIS:  Right tonsil mass.  PROCEDURE:  Right tonsil biopsy.  SURGEON:  Melida Quitter, MD  ANESTHESIA:  General endotracheal anesthesia.  COMPLICATIONS:  None.  INDICATIONS:  The patient is a 78 year old female who underwent imaging in March due to a stroke that suggested a right tonsil mass.  She has had no symptoms.  She presents to the operating room for surgical biopsy.  FINDINGS:  The right tonsil was larger than the left and had scattered calculus material within the crypts.  Morphology was not unusual otherwise.  Biopsies were taken with large cutting cup forceps with a portion sent for permanent pathology and a  portion sent for lymphoma protocol.  DESCRIPTION OF PROCEDURE:  The patient was identified in the holding room, informed consent having been obtained, including discussion of risks, benefits, and alternatives.  The patient was brought to the operative suite and put on the operative table in  supine position.  Anesthesia was induced, and the patient was intubated by the anesthesia team without difficulty.  The patient was given intravenous steroids during the case.  The eyes were taped closed, and the bed was turned 90 degrees from  anesthesia.  A head wrap was placed around the patient's head, and a Crowe-Davis retractor was inserted with damp gauze placed over the upper gum and opened to reveal the oropharynx.  It was placed in suspension on the Mayo stand.  The right tonsil was  visualized and palpated.  Several biopsies were then taken with large cutting cup forceps.  A portion was sent for permanent pathology and a portion for lymphoma protocol.  After this was completed, the biopsy sites were  cauterized with suction cautery  on a setting of 30.  The throat was then suctioned, and the retractor was taken out of suspension and removed from the patient's mouth.  A nasogastric tube was passed through the mouth to suck out the stomach and esophagus.  She was then returned to  anesthesia for wakeup, was extubated, and went to the recovery room in stable condition.  LN/NUANCE  D:12/13/2018 T:12/13/2018 JOB:[id number]

## 2018-12-13 NOTE — Anesthesia Postprocedure Evaluation (Signed)
Anesthesia Post Note  Patient: [name]  Procedure(s) Performed: RIGHT TONSIL BIOPSY (Right Throat)     Patient location during evaluation: PACU Anesthesia Type: General Level of consciousness: awake and alert Pain management: pain level controlled Vital Signs Assessment: post-procedure vital signs reviewed and stable Respiratory status: spontaneous breathing, nonlabored ventilation, respiratory function stable and patient connected to nasal cannula oxygen Cardiovascular status: blood pressure returned to baseline and stable Postop Assessment: no apparent nausea or vomiting Anesthetic complications: no    Last Vitals:  Vitals:   12/13/18 1100 12/13/18 1215  BP: (!) 144/74 (!) 145/76  Pulse:  65  Resp:    Temp:    SpO2:  95%    Last Pain:  Vitals:   12/13/18 1215  PainSc: Asleep                 Yuvin Bussiere DAVID

## 2018-12-13 NOTE — Transfer of Care (Signed)
Immediate Anesthesia Transfer of Care Note  Patient: Tina Waller  Procedure(s) Performed: RIGHT TONSIL BIOPSY (Right Throat)  Patient Location: PACU  Anesthesia Type:General  Level of Consciousness: awake and oriented  Airway & Oxygen Therapy: Patient Spontanous Breathing  Post-op Assessment: Report given to RN  Post vital signs: Reviewed and stable  Last Vitals:  Vitals Value Taken Time  BP 142/58 12/13/2018 10:30 AM  Temp    Pulse 62 12/13/2018 10:30 AM  Resp 10 12/13/2018 10:30 AM  SpO2 97 % 12/13/2018 10:30 AM  Vitals shown include unvalidated device data.  Last Pain:  Vitals:   12/13/18 0917  PainSc: 0-No pain      Patients Stated Pain Goal: 0 (41/42/39 5320)  Complications: No apparent anesthesia complications

## 2018-12-13 NOTE — Anesthesia Procedure Notes (Signed)
Procedure Name: Intubation Date/Time: 12/13/2018 9:53 AM Performed by: Barrington Ellison, CRNA Pre-anesthesia Checklist: Patient identified, Emergency Drugs available, Suction available and Patient being monitored Patient Re-evaluated:Patient Re-evaluated prior to induction Oxygen Delivery Method: Circle System Utilized Preoxygenation: Pre-oxygenation with 100% oxygen Induction Type: IV induction Laryngoscope Size: Mac and 3 Grade View: Grade I Tube type: Oral Tube size: 7.0 mm Number of attempts: 1 Airway Equipment and Method: Stylet and Oral airway Placement Confirmation: ETT inserted through vocal cords under direct vision,  positive ETCO2 and breath sounds checked- equal and bilateral Secured at: 21 cm Tube secured with: Tape Dental Injury: Teeth and Oropharynx as per pre-operative assessment

## 2018-12-13 NOTE — H&P (Signed)
Tina Waller is an 78 y.o. female.   Chief Complaint: Tonsil mass HPI: 78 year old female underwent imaging related to a stroke in March that suggested a potential mass in the right tonsil.  She presents for surgical biopsy.  Past Medical History:  Diagnosis Date  . Anemia   . Anxiety   . Cataract   . Depression   . Diabetes mellitus without complication ([place])    pt denies  . GERD (gastroesophageal reflux disease)   . Gout   . Headache   . Hypertension   . Stroke [facility]) 2008   09/14/2018  . Tonsillar mass   . Type 2 diabetes mellitus ([place]) 09/14/2018  . Vertigo   . Wears dentures    upper  . Wears glasses     Past Surgical History:  Procedure Laterality Date  . ABDOMINAL HYSTERECTOMY    . BREAST SURGERY     lumpectomy  . BUNIONECTOMY    . CATARACT EXTRACTION Left   . COLONOSCOPY W/ BIOPSIES AND POLYPECTOMY    . MULTIPLE TOOTH EXTRACTIONS      Family History  Problem Relation Age of Onset  . Heart disease Mother        CHF  . Osteoporosis Neg Hx   . Colon cancer Neg Hx   . Stomach cancer Neg Hx   . Pancreatic cancer Neg Hx    Social History:  reports that she quit smoking about 12 years ago. Her smoking use included cigarettes. She has never used smokeless tobacco. She reports current alcohol use of about 7.0 standard drinks of alcohol per week. She reports that she does not use drugs.  Allergies:  Allergies  Allergen Reactions  . Lisinopril Cough  . Lipitor [Atorvastatin] Diarrhea    Medications Prior to Admission  Medication Sig Dispense Refill  . amLODipine (NORVASC) 5 MG tablet Take 1 tablet (5 mg total) by mouth daily. (Patient taking differently: Take 5 mg by mouth at bedtime. ) 90 tablet 3  . aspirin EC 81 MG tablet Take 81 mg by mouth daily.    . cholecalciferol (VITAMIN D3) 25 MCG (1000 UT) tablet Take 1,000 Units by mouth daily.    . clopidogrel (PLAVIX) 75 MG tablet Take 1 tablet (75 mg total) by mouth daily. 90 tablet 1  . furosemide (LASIX) 40  MG tablet Take 1 tablet (40 mg total) by mouth daily. 90 tablet 1  . losartan (COZAAR) 100 MG tablet Take 1 tablet (100 mg total) by mouth daily. 90 tablet 3  . metoprolol succinate (TOPROL-XL) 50 MG 24 hr tablet Take 2 tablets (100 mg total) by mouth daily. Take with or immediately following a meal. 60 tablet 2  . potassium chloride (KLOR-CON) 8 MEQ tablet Take 2 tablets (16 mEq total) by mouth daily. 180 tablet 1  . pravastatin (PRAVACHOL) 10 MG tablet Take 0.5 tablets (5 mg total) by mouth daily. For high cholesterol 30 tablet 1  . vitamin B-12 (CYANOCOBALAMIN) 1000 MCG tablet Take 1,000 mcg by mouth daily.    Marland Kitchen acetaminophen (TYLENOL) 500 MG tablet Take 500-1,000 mg by mouth every 6 (six) hours as needed for moderate pain or headache.    . meclizine (ANTIVERT) 25 MG tablet Take 1 tablet (25 mg total) by mouth 3 (three) times daily as needed for dizziness. 30 tablet 0  . polyethylene glycol (MIRALAX) packet Take 17 g by mouth daily as needed for mild constipation.    . triamcinolone cream (KENALOG) 0.1 % Apply 1 application  topically 2 (two) times daily as needed (rash).      No results found for this or any previous visit (from the past 48 hour(s)). No results found.  Review of Systems  All other systems reviewed and are negative.   Blood pressure (!) 199/52, pulse (!) 20, temperature 98.2 F (36.8 C), resp. rate 17, height 5\' 9"  (1.753 m), weight 61.6 kg, SpO2 96 %. Physical Exam  Constitutional: She is oriented to person, place, and time. She appears well-developed and well-nourished. No distress.  HENT:  Head: Normocephalic and atraumatic.  Right Ear: External ear normal.  Left Ear: External ear normal.  Nose: Nose normal.  Mouth/Throat: Oropharynx is clear and moist.  Eyes: Pupils are equal, round, and reactive to light. Conjunctivae and EOM are normal.  Neck: Normal range of motion. Neck supple.  Cardiovascular: Normal rate.  Respiratory: Effort normal.  Neurological: She is  alert and oriented to person, place, and time. No cranial nerve deficit.  Skin: Skin is warm and dry.  Psychiatric: She has a normal mood and affect. Her behavior is normal. Judgment and thought content normal.     Assessment/Plan Right tonsil mass  To OR for right tonsil biopsy.  Melida Quitter, MD 12/13/2018, 9:29 AM

## 2018-12-13 NOTE — Brief Op Note (Signed)
12/13/2018  10:13 AM  PATIENT:  Tina Waller  78 y.o. female  PRE-OPERATIVE DIAGNOSIS:  pharyngeal mass  POST-OPERATIVE DIAGNOSIS:  pharyngeal mass  PROCEDURE:  Procedure(s): RIGHT TONSIL BIOPSY (Right)  SURGEON:  Surgeon(s) and Role:    Melida Quitter, MD - Primary  PHYSICIAN ASSISTANT:   ASSISTANTS: none   ANESTHESIA:   general  EBL:  20 mL   BLOOD ADMINISTERED:none  DRAINS: none   LOCAL MEDICATIONS USED:  NONE  SPECIMEN:  Source of Specimen:  Right tonsil biopsy for permanent and for lymphoma protocol  DISPOSITION OF SPECIMEN:  PATHOLOGY  COUNTS:  YES  TOURNIQUET:  * No tourniquets in log *  DICTATION: .Other Dictation: Dictation Number [id number]  PLAN OF CARE: Discharge to home after PACU  PATIENT DISPOSITION:  PACU - hemodynamically stable.   Delay start of Pharmacological VTE agent (>24hrs) due to surgical blood loss or risk of bleeding: yes

## 2018-12-13 NOTE — Telephone Encounter (Signed)
As outlined in prior notes, this patient needs to be scheduled for a colonoscopy with me for diarrhea and rectal bleeding.  Due to her recent CVA, it needs to be done in the [facility] long endoscopy department.  I had hoped we might be able to coordinate this within the period of time she was off Plavix prior to her tonsillar biopsy done earlier today, but the schedule would not allow that.  Please contact her, and let her know I hope the tonsillar biopsy went well, and I am hopeful that they will not find anything worrisome on the pathology.  I would like to know if she feels ready to schedule the colonoscopy.  If so, please see if there is still time available in my next [facility] endoscopy block on June 15, and if the patient could be available that date. Having spoken to Dr. Redmond Baseman from ENT, who did her biopsy, he had communicated with this patient's neurologist and was able to hold her Plavix 5 days prior to the biopsy. Therefore, I believe we can hold her Plavix 5 days prior to a scheduled colonoscopy with me. (remaining on aspirin)  Let me know when you hear from her and how she feels about the timing of proceeding with colonoscopy.

## 2018-12-14 ENCOUNTER — Encounter (HOSPITAL_COMMUNITY): Payer: Self-pay | Admitting: Otolaryngology

## 2018-12-14 NOTE — Telephone Encounter (Signed)
Understood. We will wait to hear from her if she changes her mind.

## 2018-12-14 NOTE — Telephone Encounter (Signed)
I called pt about her visit tomorrow with Dr.Sethi. Pt stated she does not have a cell phone to do video visit. I stated its due to COVID 19. I stated we are trying to get pts safe. Pt knows to wear mask and wear gloves. I explain she will check in outside and screen with COVID 19 questions. Pt is low risk with the screening in epic. Pt answer no to all the COVID 19 question for shortness of breath, fever, been exposed and no traveling outside the country. I gave her directions and stated only she is allowed in waiting room with provider.Pt verbalized understanding.

## 2018-12-14 NOTE — Telephone Encounter (Signed)
Patient contacted. She is doing well. I offered the 12-27-2018 colonoscopy date. She would like to hold on having a colonoscopy at this time. She states her bowels have improved. She is also hesitant due to her age, she says she was told she didn't have to have another colonoscopy due to her age. She did thank [place] for checking in on her. Advised to call if she had any questions or if symptoms return.

## 2018-12-15 ENCOUNTER — Encounter: Payer: Self-pay | Admitting: Neurology

## 2018-12-15 ENCOUNTER — Ambulatory Visit: Payer: Medicare HMO | Admitting: Neurology

## 2018-12-15 ENCOUNTER — Other Ambulatory Visit: Payer: Self-pay

## 2018-12-15 ENCOUNTER — Encounter

## 2018-12-15 VITALS — BP 160/80 | HR 80 | Temp 99.2°F | Ht 69.0 in | Wt 137.8 lb

## 2018-12-15 DIAGNOSIS — I63219 Cerebral infarction due to unspecified occlusion or stenosis of unspecified vertebral arteries: Secondary | ICD-10-CM | POA: Diagnosis not present

## 2018-12-15 DIAGNOSIS — I639 Cerebral infarction, unspecified: Secondary | ICD-10-CM | POA: Diagnosis not present

## 2018-12-15 DIAGNOSIS — Z8673 Personal history of transient ischemic attack (TIA), and cerebral infarction without residual deficits: Secondary | ICD-10-CM | POA: Diagnosis not present

## 2018-12-15 DIAGNOSIS — E7800 Pure hypercholesterolemia, unspecified: Secondary | ICD-10-CM

## 2018-12-15 MED ORDER — PRAVASTATIN SODIUM 80 MG PO TABS: 80.0000 mg | ORAL_TABLET | Freq: Every day | ORAL | 3 refills | Status: DC

## 2018-12-15 NOTE — Patient Instructions (Signed)
I had a long d/w patient about her recent  Cerebellar stroke, multifocal intracranial stenosis and recurrent posterior circulation TIA episodes,risk for recurrent stroke/TIAs, personally independently reviewed imaging studies and stroke evaluation results and answered questions.Continue aspirin 81 mg daily and clopidogrel 75 mg daily  for secondary stroke prevention and maintain strict control of hypertension with blood pressure goal below 130/90, diabetes with hemoglobin A1c goal below 6.5% and lipids with LDL cholesterol goal below 70 mg/dL. I also advised the patient to eat a healthy diet with plenty of whole grains, cereals, fruits and vegetables, exercise regularly and maintain ideal body weight.  I recommend she try Pravachol 80 mg daily due to her significant intracranial atherosclerosis and vascular risk factors and if she is unable to tolerate this may consider switching to the new PCSK9 inhibitor injections in the future.  Also check diagnostic cerebral catheter angiogram to look for any endovascularly treatable vertebral artery stenosis since she is having recurrent episodes of dizziness and headache which may be vertebrobasilar insufficiency.  I also encouraged her to maintain adequate hydration.  Followup in the future with me in 3 months or call earlier if necessary.  Stroke Prevention Some medical conditions and behaviors are associated with a higher chance of having a stroke. You can help prevent a stroke by making nutrition, lifestyle, and other changes, including managing any medical conditions you may have. What nutrition changes can be made?   Eat healthy foods. You can do this by: ? Choosing foods high in fiber, such as fresh fruits and vegetables and whole grains. ? Eating at least 5 or more servings of fruits and vegetables a day. Try to fill half of your plate at each meal with fruits and vegetables. ? Choosing lean protein foods, such as lean cuts of meat, poultry without skin,  fish, tofu, beans, and nuts. ? Eating low-fat dairy products. ? Avoiding foods that are high in salt (sodium). This can help lower blood pressure. ? Avoiding foods that have saturated fat, trans fat, and cholesterol. This can help prevent high cholesterol. ? Avoiding processed and premade foods.  Follow your health care provider's specific guidelines for losing weight, controlling high blood pressure (hypertension), lowering high cholesterol, and managing diabetes. These may include: ? Reducing your daily calorie intake. ? Limiting your daily sodium intake to 1,500 milligrams (mg). ? Using only healthy fats for cooking, such as olive oil, canola oil, or sunflower oil. ? Counting your daily carbohydrate intake. What lifestyle changes can be made?  Maintain a healthy weight. Talk to your health care provider about your ideal weight.  Get at least 30 minutes of moderate physical activity at least 5 days a week. Moderate activity includes brisk walking, biking, and swimming.  Do not use any products that contain nicotine or tobacco, such as cigarettes and e-cigarettes. If you need help quitting, ask your health care provider. It may also be helpful to avoid exposure to secondhand smoke.  Limit alcohol intake to no more than 1 drink a day for nonpregnant women and 2 drinks a day for men. One drink equals 12 oz of beer, 5 oz of wine, or 1 oz of hard liquor.  Stop any illegal drug use.  Avoid taking birth control pills. Talk to your health care provider about the risks of taking birth control pills if: ? You are over 19 years old. ? You smoke. ? You get migraines. ? You have ever had a blood clot. What other changes can be made?  Manage  your cholesterol levels. ? Eating a healthy diet is important for preventing high cholesterol. If cholesterol cannot be managed through diet alone, you may also need to take medicines. ? Take any prescribed medicines to control your cholesterol as told by  your health care provider.  Manage your diabetes. ? Eating a healthy diet and exercising regularly are important parts of managing your blood sugar. If your blood sugar cannot be managed through diet and exercise, you may need to take medicines. ? Take any prescribed medicines to control your diabetes as told by your health care provider.  Control your hypertension. ? To reduce your risk of stroke, try to keep your blood pressure below 130/80. ? Eating a healthy diet and exercising regularly are an important part of controlling your blood pressure. If your blood pressure cannot be managed through diet and exercise, you may need to take medicines. ? Take any prescribed medicines to control hypertension as told by your health care provider. ? Ask your health care provider if you should monitor your blood pressure at home. ? Have your blood pressure checked every year, even if your blood pressure is normal. Blood pressure increases with age and some medical conditions.  Get evaluated for sleep disorders (sleep apnea). Talk to your health care provider about getting a sleep evaluation if you snore a lot or have excessive sleepiness.  Take over-the-counter and prescription medicines only as told by your health care provider. Aspirin or blood thinners (antiplatelets or anticoagulants) may be recommended to reduce your risk of forming blood clots that can lead to stroke.  Make sure that any other medical conditions you have, such as atrial fibrillation or atherosclerosis, are managed. What are the warning signs of a stroke? The warning signs of a stroke can be easily remembered as BEFAST.  B is for balance. Signs include: ? Dizziness. ? Loss of balance or coordination. ? Sudden trouble walking.  E is for eyes. Signs include: ? A sudden change in vision. ? Trouble seeing.  F is for face. Signs include: ? Sudden weakness or numbness of the face. ? The face or eyelid drooping to one side.  A  is for arms. Signs include: ? Sudden weakness or numbness of the arm, usually on one side of the body.  S is for speech. Signs include: ? Trouble speaking (aphasia). ? Trouble understanding.  T is for time. ? These symptoms may represent a serious problem that is an emergency. Do not wait to see if the symptoms will go away. Get medical help right away. Call your local emergency services (911 in the [place] Do not drive yourself to the hospital.  Other signs of stroke may include: ? A sudden, severe headache with no known cause. ? Nausea or vomiting. ? Seizure. Where to find more information For more information, visit:  American Stroke Association: www.strokeassociation.org  National Stroke Association: www.stroke.org Summary  You can prevent a stroke by eating healthy, exercising, not smoking, limiting alcohol intake, and managing any medical conditions you may have.  Do not use any products that contain nicotine or tobacco, such as cigarettes and e-cigarettes. If you need help quitting, ask your health care provider. It may also be helpful to avoid exposure to secondhand smoke.  Remember BEFAST for warning signs of stroke. Get help right away if you or a loved one has any of these signs. This information is not intended to replace advice given to you by your health care provider. Make sure you discuss any  questions you have with your health care provider. Document Released: 08/07/2004 Document Revised: 08/05/2016 Document Reviewed: 08/05/2016 Elsevier Interactive Patient Education  2019 [employer].

## 2018-12-15 NOTE — Progress Notes (Signed)
[facility] [address] [place]. [place] [address] [phone]       OFFICE FOLLOW-UP NOTE  Tina Waller Date of Birth:  Jun 23, 1941 Medical Record Number:  [id number]   HPI: Tina Waller is a 78 year old African-American lady seen today for initial office follow-up visit following hospital admission for stroke in March 2020.  History is obtained from the patient and review of electronic medical records.  I personally reviewed imaging films in PACS.  She initially presented on 09/14/2018 to [facility] for dizziness for several days.  She describes this as vertigo and without vomiting but some nausea.  MRI scan showed a small right cerebellar infarct and changes of small vessel disease.  CT angiogram of the brain and neck showed diffuse intracranial atherosclerotic disease with severe stenosis of right vertebral artery origin, left vertebral artery in the V4 segment.  Bilateral cavernous carotid right posterior cerebral artery left middle cerebral artery.  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  She had a 2D echo which was unremarkable LDL cholesterol is elevated 159.  She has a history of developing diarrhea on Lipitor in the past and so started on Pravachol 10 mg only.  She is tolerating it well without diarrhea so far.  Hemoglobin A1c is 5.6.  She had a history of drinking alcohol and was advised to drink not more than 1 drink per day.  Patient's was back in the ER twice on 09/26/2016 as well as 09/28/2016 for recurrent episodes of dizziness along with headaches.  These were felt to be peripheral vestibular nature she was given some meclizine and blood pressure medications which helped.  But she states her symptoms still continue and come abruptly she has to sit down and hold on otherwise she will fall.  The last several minutes and she has to essentially waited  out.  She is no longer taking meclizine.  She states her blood pressure still continues to be high today it is elevated in the office at 160/80.  She is on aspirin and Plavix tolerating them well without bleeding or bruising.  She is also scheduled to undergo colonoscopy by Dr. Wilfrid Lund soon  ROS:   14 system review of systems is positive for dizziness, headache, gait difficulty and all other systems negative  PMH:  Past Medical History:  Diagnosis Date   Anemia    Anxiety    Cataract    Depression    Diabetes mellitus without complication ([place])    pt denies   GERD (gastroesophageal reflux disease)    Gout    Headache    Hypertension    Stroke [facility]) 2008   09/14/2018   Tonsillar mass    Type 2 diabetes mellitus ([place]) 09/14/2018   Vertigo    Wears dentures    upper   Wears glasses     Social History:  Social History   Socioeconomic History   Marital status: Divorced    Spouse name: Not on file   Number of children: Not on file   Years of education: Not on file   Highest education level: Not on file  Occupational History   Not on file  Social Needs   Financial resource strain: Not on file   Food insecurity:    Worry: Not on file    Inability: Not on file   Transportation needs:    Medical: Not  on file    Non-medical: Not on file  Tobacco Use   Smoking status: Former Smoker    Types: Cigarettes    Last attempt to quit: 07/14/2006    Years since quitting: 12.4   Smokeless tobacco: Never Used  Substance and Sexual Activity   Alcohol use: Yes    Alcohol/week: 7.0 standard drinks    Types: 7 Shots of liquor per week    Comment: 2020  " I drink twice a week "   Drug use: No   Sexual activity: Not on file  Lifestyle   Physical activity:    Days per week: Not on file    Minutes per session: Not on file   Stress: Not on file  Relationships   Social connections:    Talks on phone: Not on file    Gets together: Not on file    Attends  religious service: Not on file    Active member of club or organization: Not on file    Attends meetings of clubs or organizations: Not on file    Relationship status: Not on file   Intimate partner violence:    Fear of current or ex partner: Not on file    Emotionally abused: Not on file    Physically abused: Not on file    Forced sexual activity: Not on file  Other Topics Concern   Not on file  Social History Narrative   Not on file    Medications:   Current Outpatient Medications on File Prior to Visit  Medication Sig Dispense Refill   acetaminophen (TYLENOL) 500 MG tablet Take 500-1,000 mg by mouth every 6 (six) hours as needed for moderate pain or headache.     amLODipine (NORVASC) 5 MG tablet Take 1 tablet (5 mg total) by mouth daily. (Patient taking differently: Take 5 mg by mouth at bedtime. ) 90 tablet 3   aspirin EC 81 MG tablet Take 81 mg by mouth daily.     cholecalciferol (VITAMIN D3) 25 MCG (1000 UT) tablet Take 1,000 Units by mouth daily.     clopidogrel (PLAVIX) 75 MG tablet Take 1 tablet (75 mg total) by mouth daily. 90 tablet 1   furosemide (LASIX) 40 MG tablet Take 1 tablet (40 mg total) by mouth daily. 90 tablet 1   losartan (COZAAR) 100 MG tablet Take 1 tablet (100 mg total) by mouth daily. 90 tablet 3   meclizine (ANTIVERT) 25 MG tablet Take 1 tablet (25 mg total) by mouth 3 (three) times daily as needed for dizziness. 30 tablet 0   metoprolol succinate (TOPROL-XL) 50 MG 24 hr tablet Take 2 tablets (100 mg total) by mouth daily. Take with or immediately following a meal. 60 tablet 2   polyethylene glycol (MIRALAX) packet Take 17 g by mouth daily as needed for mild constipation.     potassium chloride (KLOR-CON) 8 MEQ tablet Take 2 tablets (16 mEq total) by mouth daily. 180 tablet 1   triamcinolone cream (KENALOG) 0.1 % Apply 1 application topically 2 (two) times daily as needed (rash).     vitamin B-12 (CYANOCOBALAMIN) 1000 MCG tablet Take 1,000 mcg  by mouth daily.     No current facility-administered medications on file prior to visit.     Allergies:   Allergies  Allergen Reactions   Lisinopril Cough   Lipitor [Atorvastatin] Diarrhea    Physical Exam General: Frail elderly African-American lady, seated, in no evident distress Head: head normocephalic and atraumatic.  Neck: supple  with no carotid or supraclavicular bruits Cardiovascular: regular rate and rhythm, no murmurs Musculoskeletal: no deformity Skin:  no rash/petichiae Vascular:  Normal pulses all extremities Vitals:   12/15/18 1101  BP: (!) 160/80  Pulse: 80  Temp: 99.2 F (37.3 C)   Neurologic Exam Mental Status: Awake and fully alert. Oriented to place and time. Recent and remote memory intact. Attention span, concentration and fund of knowledge appropriate. Mood and affect appropriate.  Cranial Nerves: Fundoscopic exam reveals sharp disc margins. Pupils equal, briskly reactive to light. Extraocular movements full without nystagmus. Visual fields full to confrontation. Hearing intact. Facial sensation intact. Face, tongue, palate moves normally and symmetrically.  Motor: Normal bulk and tone. Normal strength in all tested extremity muscles. Sensory.: intact to touch ,pinprick .position and vibratory sensation.  Coordination: Rapid alternating movements normal in all extremities. Finger-to-nose and heel-to-shin performed accurately bilaterally. Gait and Station: Arises from chair without difficulty. Stance is normal. Gait demonstrates normal stride length and and only slight imbalance .  Not able to heel, toe and tandem walk without difficulty.  Reflexes: 1+ and symmetric. Toes downgoing.   NIHSS  0 Modified Rankin  2   ASSESSMENT: 78 year old African-American lady with small right cerebellar infarct in March 2020 secondary to vertebral artery stenosis with recurrent episodes of transient dizziness and headaches possibly vertebrobasilar ischemia.  Multiple  vascular risk factors of diabetes, hypertension, hyperlipidemia and stroke.  Incidentally diagnosed right peritonsillar mass biopsy results pending.    PLAN: I had a long d/w patient about her recent  cerebellar stroke, multifocal intracranial stenosis and recurrent posterior circulation TIA episodes,risk for recurrent stroke/TIAs, personally independently reviewed imaging studies and stroke evaluation results and answered questions.Continue aspirin 81 mg daily and clopidogrel 75 mg daily  for secondary stroke prevention and maintain strict control of hypertension with blood pressure goal below 130/90, diabetes with hemoglobin A1c goal below 6.5% and lipids with LDL cholesterol goal below 70 mg/dL. I also advised the patient to eat a healthy diet with plenty of whole grains, cereals, fruits and vegetables, exercise regularly and maintain ideal body weight.  I recommend she try Pravachol 80 mg daily due to her significant intracranial atherosclerosis and vascular risk factors and if she is unable to tolerate this may consider switching to the new PCSK9 inhibitor injections in the future.  Also check diagnostic cerebral catheter angiogram to look for any endovascularly treatable vertebral artery stenosis since she is having recurrent episodes of dizziness and headache which may be vertebrobasilar insufficiency.  I also encouraged her to maintain adequate hydration.  Followup in the future with me in 3 months or call earlier if necessary. Greater than 50% of time during this 25 minute visit was spent on counseling,explanation of diagnosis, planning of further management, discussion with patient and family and coordination of care Antony Contras, MD  Note: This document was prepared with digital dictation and possible smart phrase technology. Any transcriptional errors that result from this process are unintentional

## 2018-12-23 ENCOUNTER — Telehealth (HOSPITAL_COMMUNITY): Payer: Self-pay

## 2018-12-23 NOTE — Telephone Encounter (Signed)
Called to schedule angio. Pt does not want to schedule at this time. She says she is a little scared and would like to wait and talk to her daughter. She wants to wait until July and schedule. AW

## 2019-01-31 ENCOUNTER — Other Ambulatory Visit: Payer: Self-pay

## 2019-01-31 DIAGNOSIS — E7800 Pure hypercholesterolemia, unspecified: Secondary | ICD-10-CM

## 2019-01-31 DIAGNOSIS — Z8673 Personal history of transient ischemic attack (TIA), and cerebral infarction without residual deficits: Secondary | ICD-10-CM

## 2019-01-31 MED ORDER — PRAVASTATIN SODIUM 80 MG PO TABS: 80.0000 mg | ORAL_TABLET | Freq: Every day | ORAL | 3 refills | Status: DC

## 2019-03-14 ENCOUNTER — Other Ambulatory Visit: Payer: Self-pay | Admitting: Internal Medicine

## 2019-03-16 ENCOUNTER — Other Ambulatory Visit: Payer: Self-pay | Admitting: Pharmacist

## 2019-03-16 DIAGNOSIS — Z8673 Personal history of transient ischemic attack (TIA), and cerebral infarction without residual deficits: Secondary | ICD-10-CM

## 2019-03-16 DIAGNOSIS — I10 Essential (primary) hypertension: Secondary | ICD-10-CM

## 2019-03-16 DIAGNOSIS — E7800 Pure hypercholesterolemia, unspecified: Secondary | ICD-10-CM

## 2019-03-16 MED ORDER — AMLODIPINE BESYLATE 5 MG PO TABS: 5.0000 mg | ORAL_TABLET | Freq: Every day | ORAL | 0 refills | Status: DC

## 2019-03-16 MED ORDER — PRAVASTATIN SODIUM 80 MG PO TABS: 80.0000 mg | ORAL_TABLET | Freq: Every day | ORAL | 2 refills | Status: DC

## 2019-03-22 ENCOUNTER — Other Ambulatory Visit: Payer: Self-pay

## 2019-03-22 ENCOUNTER — Telehealth: Payer: Self-pay | Admitting: Internal Medicine

## 2019-03-22 ENCOUNTER — Ambulatory Visit (INDEPENDENT_AMBULATORY_CARE_PROVIDER_SITE_OTHER): Payer: Medicare HMO | Admitting: Neurology

## 2019-03-22 ENCOUNTER — Encounter: Payer: Self-pay | Admitting: Neurology

## 2019-03-22 VITALS — BP 170/100 | HR 86 | Temp 97.8°F | Wt 140.0 lb

## 2019-03-22 DIAGNOSIS — R42 Dizziness and giddiness: Secondary | ICD-10-CM | POA: Diagnosis not present

## 2019-03-22 MED ORDER — METOPROLOL SUCCINATE ER 50 MG PO TB24: ORAL_TABLET | ORAL | 0 refills | Status: DC

## 2019-03-22 NOTE — Patient Instructions (Signed)
I had a long discussion with the patient regarding her symptoms of dizziness and gait imbalance and vertebrobasilar atherosclerotic disease and answered questions.  She seems to be having significant underlying anxiety and stress at the moment and I encouraged her to see her primary care physician to seek help with managing this first.  She can have a diagnostic cerebral catheter angiogram later once she is feeling better to see if she has any endovascularly treatable lesion.  She was advised to get up slowly and avoid sudden movements.  She will stay on aspirin and Plavix for stroke prevention and maintain strict control of hypertension with blood pressure goal below 130/90, lipids with LDL cholesterol goal below 70 mg percent and diabetes with hemoglobin A1c goal below 6.5%.  She will return for follow-up in the future in 3 months with my nurse practitioner Janett Billow or call earlier if necessary.

## 2019-03-22 NOTE — Progress Notes (Signed)
[facility] [address] [place]. [place] [address] [phone]       OFFICE FOLLOW-UP NOTE  [name] Date of Birth:  07-Jan-1941 Medical Record Number:  [id number]   HPI: Tina Waller is a 78 year old African-American lady seen today for initial office follow-up visit following hospital admission for stroke in March 2020.  History is obtained from the Tina Waller and review of electronic medical records.  I personally reviewed imaging films in PACS.  She initially presented on 09/14/2018 to [facility] for dizziness for several days.  She describes this as vertigo and without vomiting but some nausea.  MRI scan showed a small right cerebellar infarct and changes of small vessel disease.  CT angiogram of the brain and neck showed diffuse intracranial atherosclerotic disease with severe stenosis of right vertebral artery origin, left vertebral artery in the V4 segment.  Bilateral cavernous carotid right posterior cerebral artery left middle cerebral artery.  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  She had a 2D echo which was unremarkable LDL cholesterol is elevated 159.  She has a history of developing diarrhea on Lipitor in the past and so started on Pravachol 10 mg only.  She is tolerating it well without diarrhea so far.  Hemoglobin A1c is 5.6.  She had a history of drinking alcohol and was advised to drink not more than 1 drink per day.  Tina Waller's was back in the ER twice on 09/26/2016 as well as 09/28/2016 for recurrent episodes of dizziness along with headaches.  These were felt to be peripheral vestibular nature she was given some meclizine and blood pressure medications which helped.  But she states her symptoms still continue and come abruptly she has to sit down and hold on otherwise she will fall.  The last several minutes and she has to essentially waited  out.  She is no longer taking meclizine.  She states her blood pressure still continues to be high today it is elevated in the office at 160/80.  She is on aspirin and Plavix tolerating them well without bleeding or bruising.  She is also scheduled to undergo colonoscopy by Dr. Wilfrid Lund soon Update 03/22/2019: Tina Waller is seen for follow-up after last visit with me 3 months ago.  She continues to have transient intermittent dizzy episodes which seem to be related to position change and did not last long.  She states she did not take her blood pressure medicine this morning and her blood pressure was elevated upon arrival today here at 170/100.  She admits to being quite stressed out because she got lost while driving here.  She does also have issues with anxiety.  She has been trying to see her primary care physician Dr. Karle Plumber but has not been able to see her recently.  She remains on aspirin Plavix which is tolerating well without bruising or bleeding.  She states she is tolerating Pravachol well without muscle aches and pains.  At last visit I had recommended Tina Waller undergo diagnostic cerebral catheter angiogram to evaluate for any treatable occlusive vertebrobasilar disease however the Tina Waller is quite anxious and does not want angiogram at the present time. ROS:   14 system review of systems is positive for dizziness, headache, anxiety,gait difficulty and all other systems negative  PMH:  Past Medical History:  Diagnosis Date   Anemia    Anxiety  Cataract    Depression    Diabetes mellitus without complication ([place])    pt denies   GERD (gastroesophageal reflux disease)    Gout    Headache    Hypertension    Stroke [facility]) 2008   09/14/2018   Tonsillar mass    Type 2 diabetes mellitus ([place]) 09/14/2018   Vertigo    Wears dentures    upper   Wears glasses     Social History:  Social History   Socioeconomic History   Marital status: Divorced    Spouse name: Not  on file   Number of children: Not on file   Years of education: Not on file   Highest education level: Not on file  Occupational History   Not on file  Social Needs   Financial resource strain: Not on file   Food insecurity    Worry: Not on file    Inability: Not on file   Transportation needs    Medical: Not on file    Non-medical: Not on file  Tobacco Use   Smoking status: Former Smoker    Types: Cigarettes    Quit date: 07/14/2006    Years since quitting: 12.6   Smokeless tobacco: Never Used  Substance and Sexual Activity   Alcohol use: Yes    Alcohol/week: 7.0 standard drinks    Types: 7 Shots of liquor per week    Comment: 2020  " I drink twice a week "   Drug use: No   Sexual activity: Not on file  Lifestyle   Physical activity    Days per week: Not on file    Minutes per session: Not on file   Stress: Not on file  Relationships   Social connections    Talks on phone: Not on file    Gets together: Not on file    Attends religious service: Not on file    Active member of club or organization: Not on file    Attends meetings of clubs or organizations: Not on file    Relationship status: Not on file   Intimate partner violence    Fear of current or ex partner: Not on file    Emotionally abused: Not on file    Physically abused: Not on file    Forced sexual activity: Not on file  Other Topics Concern   Not on file  Social History Narrative   Not on file    Medications:   Current Outpatient Medications on File Prior to Visit  Medication Sig Dispense Refill   acetaminophen (TYLENOL) 500 MG tablet Take 500-1,000 mg by mouth every 6 (six) hours as needed for moderate pain or headache.     amLODipine (NORVASC) 5 MG tablet Take 1 tablet (5 mg total) by mouth daily. 90 tablet 0   aspirin EC 81 MG tablet Take 81 mg by mouth daily.     cholecalciferol (VITAMIN D3) 25 MCG (1000 UT) tablet Take 1,000 Units by mouth daily.     clopidogrel (PLAVIX)  75 MG tablet Take 1 tablet (75 mg total) by mouth daily. 90 tablet 1   furosemide (LASIX) 40 MG tablet Take 1 tablet (40 mg total) by mouth daily. 90 tablet 1   losartan (COZAAR) 100 MG tablet Take 1 tablet (100 mg total) by mouth daily. 90 tablet 3   meclizine (ANTIVERT) 25 MG tablet Take 1 tablet (25 mg total) by mouth 3 (three) times daily as needed for dizziness. 30 tablet 0  metoprolol succinate (TOPROL-XL) 25 MG 24 hr tablet      metoprolol succinate (TOPROL-XL) 50 MG 24 hr tablet TAKE 1 TABLET (50 MG TOTAL) DAILY. TAKE WITH OR IMMEDIATELY FOLLOWING A MEAL. 90 tablet 0   polyethylene glycol (MIRALAX) packet Take 17 g by mouth daily as needed for mild constipation.     potassium chloride (KLOR-CON) 8 MEQ tablet Take 2 tablets (16 mEq total) by mouth daily. 180 tablet 1   pravastatin (PRAVACHOL) 80 MG tablet Take 1 tablet (80 mg total) by mouth daily. For high cholesterol 30 tablet 2   triamcinolone cream (KENALOG) 0.1 % Apply 1 application topically 2 (two) times daily as needed (rash).     vitamin B-12 (CYANOCOBALAMIN) 1000 MCG tablet Take 1,000 mcg by mouth daily.     No current facility-administered medications on file prior to visit.     Allergies:   Allergies  Allergen Reactions   Lisinopril Cough   Lipitor [Atorvastatin] Diarrhea    Physical Exam General: Frail elderly African-American lady, seated, in no evident distress but appears anxious Head: head normocephalic and atraumatic.  Neck: supple with no carotid or supraclavicular bruits Cardiovascular: regular rate and rhythm, no murmurs Musculoskeletal: no deformity Skin:  no rash/petichiae Vascular:  Normal pulses all extremities Vitals:   03/22/19 1133  BP: (!) 170/100  Pulse: 86  Temp: 97.8 F (36.6 C)   Neurologic Exam Mental Status: anxious Awake and fully alert. Oriented to place and time. Recent and remote memory intact. Attention span, concentration and fund of knowledge appropriate. Mood and  affect appropriate.  Cranial Nerves: Fundoscopic exam reveals sharp disc margins. Pupils equal, briskly reactive to light. Extraocular movements full without nystagmus. Visual fields full to confrontation. Hearing intact. Facial sensation intact. Face, tongue, palate moves normally and symmetrically.  Motor: Normal bulk and tone. Normal strength in all tested extremity muscles. Sensory.: intact to touch ,pinprick .position and vibratory sensation.  Coordination: Rapid alternating movements normal in all extremities. Finger-to-nose and heel-to-shin performed accurately bilaterally. Gait and Station: Arises from chair without difficulty. Stance is normal. Gait demonstrates normal stride length and and only slight imbalance .  Not able to heel, toe and tandem walk without difficulty.  Reflexes: 1+ and symmetric. Toes downgoing.       ASSESSMENT: 78 year old African-American lady with small right cerebellar infarct in March 2020 secondary to vertebral artery stenosis with recurrent episodes of transient dizziness and headaches possibly vertebrobasilar ischemia.  Multiple vascular risk factors of diabetes, hypertension, hyperlipidemia and stroke. Significant underlying anxiety/stress issues   PLAN: I had a long discussion with the Tina Waller regarding her symptoms of dizziness and gait imbalance and vertebrobasilar atherosclerotic disease and answered questions.  She seems to be having significant underlying anxiety and stress at the moment and I encouraged her to see her primary care physician to seek help with managing this first.  She can have a diagnostic cerebral catheter angiogram later once she is feeling better to see if she has any endovascularly treatable lesion.  She was advised to get up slowly and avoid sudden movements.  She will stay on aspirin and Plavix for stroke prevention and maintain strict control of hypertension with blood pressure goal below 130/90, lipids with LDL cholesterol goal  below 70 mg percent and diabetes with hemoglobin A1c goal below 6.5%.  She will return for follow-up in the future in 3 months with my nurse practitioner Janett Billow or call earlier if necessary. Greater than 50% of time during this 25 minute visit was  spent on counseling,explanation of diagnosis, planning of further management, discussion with Tina Waller and family and coordination of care Antony Contras, MD  Note: This document was prepared with digital dictation and possible smart phrase technology. Any transcriptional errors that result from this process are unintentional

## 2019-03-22 NOTE — Telephone Encounter (Signed)
New Message   1) Medication(s) Requested (by name): Metoprolol  2) Pharmacy of Choice: [facility] on [address]  3) Special Requests: pt is requesting a refill until her appt on 10/9   Approved medications will be sent to the pharmacy, we will reach out if there is an issue.  Requests made after 3pm may not be addressed until the following business day!  If a patient is unsure of the name of the medication(s) please note and ask patient to call back when they are able to provide all info, do not send to responsible party until all information is available!

## 2019-04-19 DIAGNOSIS — N183 Chronic kidney disease, stage 3 unspecified: Secondary | ICD-10-CM | POA: Diagnosis not present

## 2019-04-19 DIAGNOSIS — E213 Hyperparathyroidism, unspecified: Secondary | ICD-10-CM | POA: Diagnosis not present

## 2019-04-19 DIAGNOSIS — I129 Hypertensive chronic kidney disease with stage 1 through stage 4 chronic kidney disease, or unspecified chronic kidney disease: Secondary | ICD-10-CM | POA: Diagnosis not present

## 2019-04-19 DIAGNOSIS — R809 Proteinuria, unspecified: Secondary | ICD-10-CM | POA: Diagnosis not present

## 2019-04-22 ENCOUNTER — Other Ambulatory Visit: Payer: Self-pay

## 2019-04-22 ENCOUNTER — Ambulatory Visit: Payer: Medicare HMO | Attending: Internal Medicine | Admitting: Internal Medicine

## 2019-04-22 ENCOUNTER — Encounter: Payer: Self-pay | Admitting: Internal Medicine

## 2019-04-22 VITALS — BP 177/80 | HR 70 | Temp 98.3°F | Resp 18 | Ht 69.0 in | Wt 144.0 lb

## 2019-04-22 DIAGNOSIS — Z23 Encounter for immunization: Secondary | ICD-10-CM | POA: Diagnosis not present

## 2019-04-22 DIAGNOSIS — Z8673 Personal history of transient ischemic attack (TIA), and cerebral infarction without residual deficits: Secondary | ICD-10-CM | POA: Diagnosis not present

## 2019-04-22 DIAGNOSIS — I10 Essential (primary) hypertension: Secondary | ICD-10-CM | POA: Diagnosis not present

## 2019-04-22 DIAGNOSIS — I672 Cerebral atherosclerosis: Secondary | ICD-10-CM

## 2019-04-22 DIAGNOSIS — J358 Other chronic diseases of tonsils and adenoids: Secondary | ICD-10-CM

## 2019-04-22 DIAGNOSIS — N1832 Chronic kidney disease, stage 3b: Secondary | ICD-10-CM

## 2019-04-22 DIAGNOSIS — E7800 Pure hypercholesterolemia, unspecified: Secondary | ICD-10-CM

## 2019-04-22 DIAGNOSIS — K625 Hemorrhage of anus and rectum: Secondary | ICD-10-CM

## 2019-04-22 MED ORDER — PRAVASTATIN SODIUM 80 MG PO TABS: 80.0000 mg | ORAL_TABLET | Freq: Every day | ORAL | 2 refills | Status: DC

## 2019-04-22 MED ORDER — CLOPIDOGREL BISULFATE 75 MG PO TABS: 75.0000 mg | ORAL_TABLET | Freq: Every day | ORAL | 1 refills | Status: DC

## 2019-04-22 MED ORDER — AMLODIPINE BESYLATE 5 MG PO TABS: 5.0000 mg | ORAL_TABLET | Freq: Every day | ORAL | 0 refills | Status: DC

## 2019-04-22 MED ORDER — LOSARTAN POTASSIUM 100 MG PO TABS: 100.0000 mg | ORAL_TABLET | Freq: Every day | ORAL | 3 refills | Status: DC

## 2019-04-22 MED ORDER — METOPROLOL SUCCINATE ER 50 MG PO TB24: 100.0000 mg | ORAL_TABLET | Freq: Every day | ORAL | 3 refills | Status: DC

## 2019-04-22 NOTE — Progress Notes (Signed)
Patient ID: Tina Waller, female    DOB: 04-17-1941  MRN: [id number]  CC: No chief complaint on file.   Subjective: Tina Waller is a 78 y.o. female who presents for chronic ds management Her concerns today include:  Patient with history ofHTN,HL, vit D deficiency, osteoporosis on Dexascandone fall 2017,primary hyperparathyroidism,depression,CVA in 2008and 3/2020and gout.   Tonsil mass: Patient had biopsy in June.  This revealed benign lymphoid tissue.   HYPERTENSION Currently taking: see medication list Med Adherence: [x]  Yes.  Of note, she is taking 100 mg of metoprolol as discussed on previous visit with me even though her med list does not reflect that. Medication side effects: []  Yes    [x]  No Adherence with salt restriction: [x]  Yes    []  No Home Monitoring?: [x]  Yes - daily    []  No Monitoring Frequency:  Home BP results range: pt has log book.  151/54, 161/68, 138/67, 143/78, 109/81, 163/71 with pulse mainly in 60s SOB? []  Yes    [x]  No Chest Pain?: []  Yes    [x]  No Leg swelling?: [x]  Yes -legs and feett but not a lot Headaches?: [x]  Yes  - sometimes   Dizziness? [x]  Yes    []  No Comments:    CVA:  Saw Dr. Leonie Man in June.  He thinks some of her dizziness and gait imbalance may be due to VBI as she has vertebral artery stenosis.  He recommends that she remain on aspirin and Plavix and try to maximize the dose of Pravachol to 80 mg daily if she is able to tolerate.  He also recommend good blood pressure control.    Was suppose to have c-scope for evaluation of blood in his stools.  This was scheduled to be done in June but had to be canceled by the gastroenterologist.  When they did call her to reschedule it patient states that she did not call back because she has decided that she does not want the procedure done anymore.  However she continues to have blood in the stools at least 3 times a week.   CKD;  Saw kidney specialist earlier this week.  Had blood  test which were abn but she does not recall the details and I have not received the notes as yet.  Told she may need kidney bx.   Patient Active Problem List   Diagnosis Date Noted  . Microscopic hematuria 09/24/2018  . Tonsillar mass 09/24/2018  . Rectal bleeding 09/24/2018  . Insomnia 09/24/2018  . Hypertensive urgency 09/14/2018  . Anxiety 09/14/2018  . Acute CVA (cerebrovascular accident) ([place]) 09/14/2018  . Primary hyperparathyroidism ([place]) 02/10/2018  . Vitamin B 12 deficiency 12/11/2017  . Pulmonary hypertension, mild ([place]) 12/11/2017  . CKD (chronic kidney disease) stage 3, GFR 30-59 ml/min 04/17/2017  . Stasis dermatitis of both legs 04/17/2017  . Osteoporosis 12/22/2016  . Hx of gout 12/22/2016  . Estrogen deficiency 05/12/2016  . Vitamin D deficiency 05/12/2016  . Weight loss 05/12/2016  . HTN (hypertension) 06/12/2015  . History of CVA (cerebrovascular accident) 03/28/2015     Current Outpatient Medications on File Prior to Visit  Medication Sig Dispense Refill  . acetaminophen (TYLENOL) 500 MG tablet Take 500-1,000 mg by mouth every 6 (six) hours as needed for moderate pain or headache.    . cholecalciferol (VITAMIN D3) 25 MCG (1000 UT) tablet Take 1,000 Units by mouth daily.    . furosemide (LASIX) 40 MG tablet Take 1 tablet (40 mg total) by  mouth daily. 90 tablet 1  . meclizine (ANTIVERT) 25 MG tablet Take 1 tablet (25 mg total) by mouth 3 (three) times daily as needed for dizziness. 30 tablet 0  . polyethylene glycol (MIRALAX) packet Take 17 g by mouth daily as needed for mild constipation.    . potassium chloride (KLOR-CON) 8 MEQ tablet Take 2 tablets (16 mEq total) by mouth daily. 180 tablet 1  . triamcinolone cream (KENALOG) 0.1 % Apply 1 application topically 2 (two) times daily as needed (rash).    . vitamin B-12 (CYANOCOBALAMIN) 1000 MCG tablet Take 1,000 mcg by mouth daily.     No current facility-administered medications on file prior to visit.      Allergies  Allergen Reactions  . Lisinopril Cough  . Lipitor [Atorvastatin] Diarrhea    Social History   Socioeconomic History  . Marital status: Divorced    Spouse name: Not on file  . Number of children: Not on file  . Years of education: Not on file  . Highest education level: Not on file  Occupational History  . Not on file  Social Needs  . Financial resource strain: Not on file  . Food insecurity    Worry: Not on file    Inability: Not on file  . Transportation needs    Medical: Not on file    Non-medical: Not on file  Tobacco Use  . Smoking status: Former Smoker    Types: Cigarettes    Quit date: 07/14/2006    Years since quitting: 12.7  . Smokeless tobacco: Never Used  Substance and Sexual Activity  . Alcohol use: Yes    Alcohol/week: 7.0 standard drinks    Types: 7 Shots of liquor per week    Comment: 2020  " I drink twice a week "  . Drug use: No  . Sexual activity: Not on file  Lifestyle  . Physical activity    Days per week: Not on file    Minutes per session: Not on file  . Stress: Not on file  Relationships  . Social Herbalist on phone: Not on file    Gets together: Not on file    Attends religious service: Not on file    Active member of club or organization: Not on file    Attends meetings of clubs or organizations: Not on file    Relationship status: Not on file  . Intimate partner violence    Fear of current or ex partner: Not on file    Emotionally abused: Not on file    Physically abused: Not on file    Forced sexual activity: Not on file  Other Topics Concern  . Not on file  Social History Narrative  . Not on file    Family History  Problem Relation Age of Onset  . Heart disease Mother        CHF  . Osteoporosis Neg Hx   . Colon cancer Neg Hx   . Stomach cancer Neg Hx   . Pancreatic cancer Neg Hx     Past Surgical History:  Procedure Laterality Date  . ABDOMINAL HYSTERECTOMY    . BREAST SURGERY      lumpectomy  . BUNIONECTOMY    . CATARACT EXTRACTION Left   . COLONOSCOPY W/ BIOPSIES AND POLYPECTOMY    . MULTIPLE TOOTH EXTRACTIONS    . TONGUE BIOPSY Right 12/13/2018   Procedure: RIGHT TONSIL BIOPSY;  Surgeon: Melida Quitter, MD;  Location: [place];  Service: ENT;  Laterality: Right;    ROS: Review of Systems Negative except as stated above  PHYSICAL EXAM: BP (!) 177/80 (BP Location: Left Arm, Patient Position: Sitting, Cuff Size: Normal)   Pulse 70   Temp 98.3 F (36.8 C) (Oral)   Resp 18   Ht 5\' 9"  (1.753 m)   Wt 144 lb (65.3 kg)   SpO2 96%   BMI 21.27 kg/m   Wt Readings from Last 3 Encounters:  04/22/19 144 lb (65.3 kg)  03/22/19 140 lb (63.5 kg)  12/15/18 137 lb 12.8 oz (62.5 kg)  Repeat blood pressure is 160/80  Physical Exam  General appearance - alert, well appearing, and in no distress Mental status - normal mood, behavior, speech, dress, motor activity, and thought processes Mouth - mucous membranes moist, pharynx normal without lesions Neck - supple, no significant adenopathy Chest - clear to auscultation, no wheezes, rales or rhonchi, symmetric air entry Heart - normal rate, regular rhythm, normal S1, S2, no murmurs, rubs, clicks or gallops Extremities -trace to 1+ pedal edema bilaterally   CMP Latest Ref Rng & Units 12/09/2018 10/14/2018 09/29/2018  Glucose 70 - 99 mg/dL 106(H) 106(H) 101(H)  BUN 8 - 23 mg/dL 34(H) 20 17  Creatinine 0.44 - 1.00 mg/dL 1.62(H) 1.10(H) 1.27(H)  Sodium 135 - 145 mmol/L 138 139 141  Potassium 3.5 - 5.1 mmol/L 4.3 4.3 4.1  Chloride 98 - 111 mmol/L 106 100 106  CO2 22 - 32 mmol/L 23 22 22   Calcium 8.9 - 10.3 mg/dL 9.4 9.7 10.0  Total Protein 6.5 - 8.1 g/dL - - -  Total Bilirubin 0.3 - 1.2 mg/dL - - -  Alkaline Phos 38 - 126 U/L - - -  AST 15 - 41 U/L - - -  ALT 0 - 44 U/L - - -   Lipid Panel     Component Value Date/Time   CHOL 215 (H) 09/14/2018 0511   TRIG 76 09/14/2018 0511   HDL 41 09/14/2018 0511   CHOLHDL 5.2  09/14/2018 0511   VLDL 15 09/14/2018 0511   LDLCALC 159 (H) 09/14/2018 0511    CBC    Component Value Date/Time   WBC 6.7 12/09/2018 1350   RBC 4.14 12/09/2018 1350   HGB 12.7 12/09/2018 1350   HGB 12.2 09/24/2018 1228   HCT 38.2 12/09/2018 1350   HCT 34.8 09/24/2018 1228   PLT 226 12/09/2018 1350   PLT 284 09/24/2018 1228   MCV 92.3 12/09/2018 1350   MCV 90 09/24/2018 1228   MCH 30.7 12/09/2018 1350   MCHC 33.2 12/09/2018 1350   RDW 12.0 12/09/2018 1350   RDW 11.4 (L) 09/24/2018 1228   LYMPHSABS 1.6 09/29/2018 1218   LYMPHSABS 1.0 11/12/2016 1132   MONOABS 0.6 09/29/2018 1218   EOSABS 0.3 09/29/2018 1218   EOSABS 0.1 11/12/2016 1132   BASOSABS 0.0 09/29/2018 1218   BASOSABS 0.0 11/12/2016 1132    ASSESSMENT AND PLAN: 1. Essential hypertension Blood pressure not at goal.  Home blood pressure readings for the most part are not at goal either but acceptable given that she has history of difficult to control blood pressure.  She will continue current dose of amlodipine, metoprolol and Cozaar.  I have not increased amlodipine to 10 mg because the higher dose causes increased lower extremity edema for her - CBC - amLODipine (NORVASC) 5 MG tablet; Take 1 tablet (5 mg total) by mouth daily.  Dispense: 90 tablet; Refill: 0  2. Stage 3b  chronic kidney disease Followed by nephrology.  I await their note from her recent visit  3. Tonsillar mass Benign pathology  4. Rectal bleeding I discussed with the patient concern that she may have lesion in the gastrointestinal tract causing the bleeding.  At best internal hemorrhoids, at worse a cancerous lesion.  I recommend that she has the colonoscopy done but patient is adamant that she does not want it done.  -I also recommend stopping aspirin and just continuing with Plavix as both increases risks of GI bleed in this elderly patient  5. History of cerebrovascular accident (CVA) involving cerebellum I recommend continuing with just 1  antiplatelet agent.  Earlier in the year I had sent a message to Dr. Leonie Man about whether she needed to be on both aspirin and Plavix, and our plan was to have her just continue with 1 after she had her tonsillar biopsy - Lipid panel - Hepatic Function Panel - pravastatin (PRAVACHOL) 80 MG tablet; Take 1 tablet (80 mg total) by mouth daily. For high cholesterol  Dispense: 90 tablet; Refill: 2  6. Intracranial atherosclerosis See #5 above  7. Need for influenza vaccination Given  8. Pure hypercholesterolemia - pravastatin (PRAVACHOL) 80 MG tablet; Take 1 tablet (80 mg total) by mouth daily. For high cholesterol  Dispense: 90 tablet; Refill: 2   Patient was given the opportunity to ask questions.  Patient verbalized understanding of the plan and was able to repeat key elements of the plan.   Orders Placed This Encounter  Procedures  . Flu Vaccine QUAD 6+ mos PF IM (Fluarix Quad PF)  . CBC  . Lipid panel  . Hepatic Function Panel     Requested Prescriptions   Signed Prescriptions Disp Refills  . metoprolol succinate (TOPROL-XL) 50 MG 24 hr tablet 180 tablet 3    Sig: Take 2 tablets (100 mg total) by mouth daily.  Marland Kitchen losartan (COZAAR) 100 MG tablet 90 tablet 3    Sig: Take 1 tablet (100 mg total) by mouth daily.  Marland Kitchen amLODipine (NORVASC) 5 MG tablet 90 tablet 0    Sig: Take 1 tablet (5 mg total) by mouth daily.  . pravastatin (PRAVACHOL) 80 MG tablet 90 tablet 2    Sig: Take 1 tablet (80 mg total) by mouth daily. For high cholesterol  . clopidogrel (PLAVIX) 75 MG tablet 90 tablet 1    Sig: Take 1 tablet (75 mg total) by mouth daily.    Return in about 3 months (around 07/23/2019).  Karle Plumber, MD, FACP

## 2019-04-22 NOTE — Patient Instructions (Signed)
Stop aspirin.  Continue Plavix.

## 2019-04-23 ENCOUNTER — Other Ambulatory Visit: Payer: Self-pay | Admitting: Internal Medicine

## 2019-04-23 DIAGNOSIS — R748 Abnormal levels of other serum enzymes: Secondary | ICD-10-CM

## 2019-04-23 LAB — HEPATIC FUNCTION PANEL
ALT: 12 IU/L (ref 0–32)
AST: 17 IU/L (ref 0–40)
Albumin: 4.4 g/dL (ref 3.7–4.7)
Alkaline Phosphatase: 191 IU/L — ABNORMAL HIGH (ref 39–117)
Bilirubin Total: 0.4 mg/dL (ref 0.0–1.2)
Bilirubin, Direct: 0.1 mg/dL (ref 0.00–0.40)
Total Protein: 7.2 g/dL (ref 6.0–8.5)

## 2019-04-23 LAB — LIPID PANEL
Chol/HDL Ratio: 3.1 ratio (ref 0.0–4.4)
Cholesterol, Total: 163 mg/dL (ref 100–199)
HDL: 53 mg/dL (ref >39)
LDL Chol Calc (NIH): 92 mg/dL (ref 0–99)
Triglycerides: 96 mg/dL (ref 0–149)
VLDL Cholesterol Cal: 18 mg/dL (ref 5–40)

## 2019-04-23 LAB — CBC
Hematocrit: 35.7 % (ref 34.0–46.6)
Hemoglobin: 12.2 g/dL (ref 11.1–15.9)
MCH: 30.2 pg (ref 26.6–33.0)
MCHC: 34.2 g/dL (ref 31.5–35.7)
MCV: 88 fL (ref 79–97)
Platelets: 271 10*3/uL (ref 150–450)
RBC: 4.04 x10E6/uL (ref 3.77–5.28)
RDW: 12.3 % (ref 11.7–15.4)
WBC: 6.6 10*3/uL (ref 3.4–10.8)

## 2019-04-25 ENCOUNTER — Telehealth: Payer: Self-pay | Admitting: *Deleted

## 2019-04-25 NOTE — Telephone Encounter (Signed)
MA UTR patient due to continuous ringing.

## 2019-04-25 NOTE — Telephone Encounter (Signed)
-----   Message from [name], MD sent at 04/23/2019  2:28 PM EDT ----- Let pt know that blood count reveals no anemia.  LDL cholesterol is 92 with goal being less than 70.  Is she taking the Pravastatin?  She has elevation in one of her liver enzymes.  I would like to do additional blood test to eval further.  Please return to lab at her convenience.

## 2019-04-28 DIAGNOSIS — H5203 Hypermetropia, bilateral: Secondary | ICD-10-CM | POA: Diagnosis not present

## 2019-06-14 ENCOUNTER — Other Ambulatory Visit: Payer: Self-pay | Admitting: Internal Medicine

## 2019-06-14 DIAGNOSIS — I10 Essential (primary) hypertension: Secondary | ICD-10-CM

## 2019-06-22 ENCOUNTER — Other Ambulatory Visit: Payer: Self-pay

## 2019-06-22 ENCOUNTER — Encounter: Payer: Self-pay | Admitting: Adult Health

## 2019-06-22 ENCOUNTER — Ambulatory Visit (INDEPENDENT_AMBULATORY_CARE_PROVIDER_SITE_OTHER): Payer: Medicare HMO | Admitting: Adult Health

## 2019-06-22 VITALS — BP 152/92 | HR 71 | Temp 97.3°F | Ht 68.0 in | Wt 151.0 lb

## 2019-06-22 DIAGNOSIS — I10 Essential (primary) hypertension: Secondary | ICD-10-CM

## 2019-06-22 DIAGNOSIS — I63219 Cerebral infarction due to unspecified occlusion or stenosis of unspecified vertebral arteries: Secondary | ICD-10-CM

## 2019-06-22 DIAGNOSIS — E785 Hyperlipidemia, unspecified: Secondary | ICD-10-CM

## 2019-06-22 DIAGNOSIS — I639 Cerebral infarction, unspecified: Secondary | ICD-10-CM

## 2019-06-22 NOTE — Patient Instructions (Signed)
Your Plan:  Recommend monitoring blood pressure at home and if remains elevated to contact PCP for further evaluation/treatment options  Please call office when you are ready to pursue further work-up as recommended by Dr. Leonie Man   Follow-up as needed     Thank you for coming to see [place] at [facility]. I hope we have been able to provide you high quality care today.  You may receive a patient satisfaction survey over the next few weeks. We would appreciate your feedback and comments so that we may continue to improve ourselves and the health of our patients.

## 2019-06-22 NOTE — Progress Notes (Signed)
[facility] [address] [place]. [place] [address] [phone]       OFFICE FOLLOW-UP NOTE  [name] Date of Birth:  Nov 13, 1940 Medical Record Number:  [id number]   HPI:  Initial visit 12/15/2018 PS: Tina Waller is a 78 year old African-American lady seen today for initial office follow-up visit following hospital admission for stroke in March 2020.  History is obtained from the patient and review of electronic medical records.  I personally reviewed imaging films in PACS.  She initially presented on 09/14/2018 to [facility] for dizziness for several days.  She describes this as vertigo and without vomiting but some nausea.  MRI scan showed a small right cerebellar infarct and changes of small vessel disease.  CT angiogram of the brain and neck showed diffuse intracranial atherosclerotic disease with severe stenosis of right vertebral artery origin, left vertebral artery in the V4 segment.  Bilateral cavernous carotid right posterior cerebral artery left middle cerebral artery.  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  She had a 2D echo which was unremarkable LDL cholesterol is elevated 159.  She has a history of developing diarrhea on Lipitor in the past and so started on Pravachol 10 mg only.  She is tolerating it well without diarrhea so far.  Hemoglobin A1c is 5.6.  She had a history of drinking alcohol and was advised to drink not more than 1 drink per day.  Patient's was back in the ER twice on 09/26/2016 as well as 09/28/2016 for recurrent episodes of dizziness along with headaches.  These were felt to be peripheral vestibular nature she was given some meclizine and blood pressure medications which helped.  But she states her symptoms still continue and come abruptly she has to sit down and hold on otherwise she will fall.  The last several minutes and she  has to essentially waited out.  She is no longer taking meclizine.  She states her blood pressure still continues to be high today it is elevated in the office at 160/80.  She is on aspirin and Plavix tolerating them well without bleeding or bruising.  She is also scheduled to undergo colonoscopy by Dr. Wilfrid Waller soon  Update 03/22/2019 PS: Patient is seen for follow-up after last visit with me 3 months ago.  She continues to have transient intermittent dizzy episodes which seem to be related to position change and did not last long.  She states she did not take her blood pressure medicine this morning and her blood pressure was elevated upon arrival today here at 170/100.  She admits to being quite stressed out because she got lost while driving here.  She does also have issues with anxiety.  She has been trying to see her primary care physician Dr. Karle Waller but has not been able to see her recently.  She remains on aspirin Plavix which is tolerating well without bruising or bleeding.  She states she is tolerating Pravachol well without muscle aches and pains.  At last visit I had recommended patient undergo diagnostic cerebral catheter angiogram to evaluate for any treatable occlusive vertebrobasilar disease however the patient is quite anxious and does not want angiogram at the present time.  Update 06/22/2019: Tina Waller is a 78 year old female who is being seen today for stroke follow-up.  Residual deficits of cough occasional dizziness episodes continuing to be more positional related but  overall improving.  She continues to ambulate with a cane due to gait impairment but denies any recent falls.  Continues on aspirin and Plavix without bleeding or bruising.  Continues on pravastatin without myalgias.  Blood pressure 152/92.  She does endorse compliance with all prescribed antihypertensives and does monitor routinely at home.  She states over the past week, blood pressure has been similar to today's  reading for unknown reasons but previously SBP typically 130s.  At prior visits with Tina Waller, he recommended pursuing diagnostic cerebral angiogram and is wanting to continue to hold off on any additional testing at this time.  No further concerns at this time.    ROS:   14 system review of systems is positive for intermittent dizziness, gait difficulty and all other systems negative  PMH:  Past Medical History:  Diagnosis Date   Anemia    Anxiety    Cataract    Depression    Diabetes mellitus without complication ([place])    pt denies   GERD (gastroesophageal reflux disease)    Gout    Headache    Hypertension    Stroke [facility]) 2008   09/14/2018   Tonsillar mass    Type 2 diabetes mellitus ([place]) 09/14/2018   Vertigo    Wears dentures    upper   Wears glasses     Social History:  Social History   Socioeconomic History   Marital status: Divorced    Spouse name: Not on file   Number of children: Not on file   Years of education: Not on file   Highest education level: Not on file  Occupational History   Not on file  Social Needs   Financial resource strain: Not on file   Food insecurity    Worry: Not on file    Inability: Not on file   Transportation needs    Medical: Not on file    Non-medical: Not on file  Tobacco Use   Smoking status: Former Smoker    Types: Cigarettes    Quit date: 07/14/2006    Years since quitting: 12.9   Smokeless tobacco: Never Used  Substance and Sexual Activity   Alcohol use: Yes    Alcohol/week: 7.0 standard drinks    Types: 7 Shots of liquor per week    Comment: 2020  " I drink twice a week "   Drug use: No   Sexual activity: Not on file  Lifestyle   Physical activity    Days per week: Not on file    Minutes per session: Not on file   Stress: Not on file  Relationships   Social connections    Talks on phone: Not on file    Gets together: Not on file    Attends religious service: Not on file     Active member of club or organization: Not on file    Attends meetings of clubs or organizations: Not on file    Relationship status: Not on file   Intimate partner violence    Fear of current or ex partner: Not on file    Emotionally abused: Not on file    Physically abused: Not on file    Forced sexual activity: Not on file  Other Topics Concern   Not on file  Social History Narrative   Not on file    Medications:   Current Outpatient Medications on File Prior to Visit  Medication Sig Dispense Refill   acetaminophen (TYLENOL) 500 MG tablet  Take 500-1,000 mg by mouth every 6 (six) hours as needed for moderate pain or headache.     amLODipine (NORVASC) 5 MG tablet Take 1 tablet (5 mg total) by mouth daily. 90 tablet 0   cholecalciferol (VITAMIN D3) 25 MCG (1000 UT) tablet Take 1,000 Units by mouth daily.     clopidogrel (PLAVIX) 75 MG tablet Take 1 tablet (75 mg total) by mouth daily. 90 tablet 1   furosemide (LASIX) 40 MG tablet TAKE 1 TABLET EVERY DAY 90 tablet 1   losartan (COZAAR) 100 MG tablet Take 1 tablet (100 mg total) by mouth daily. 90 tablet 3   meclizine (ANTIVERT) 25 MG tablet Take 1 tablet (25 mg total) by mouth 3 (three) times daily as needed for dizziness. 30 tablet 0   metoprolol succinate (TOPROL-XL) 50 MG 24 hr tablet Take 2 tablets (100 mg total) by mouth daily. 180 tablet 3   polyethylene glycol (MIRALAX) packet Take 17 g by mouth daily as needed for mild constipation.     potassium chloride (KLOR-CON) 8 MEQ tablet Take 2 tablets (16 mEq total) by mouth daily. 180 tablet 1   pravastatin (PRAVACHOL) 80 MG tablet Take 1 tablet (80 mg total) by mouth daily. For high cholesterol 90 tablet 2   triamcinolone cream (KENALOG) 0.1 % Apply 1 application topically 2 (two) times daily as needed (rash).     vitamin B-12 (CYANOCOBALAMIN) 1000 MCG tablet Take 1,000 mcg by mouth daily.     No current facility-administered medications on file prior to visit.      Allergies:   Allergies  Allergen Reactions   Lisinopril Cough   Lipitor [Atorvastatin] Diarrhea    Physical Exam  Today's Vitals   06/22/19 1429  BP: (!) 152/92  Pulse: 71  Temp: (!) 97.3 F (36.3 C)  Weight: 151 lb (68.5 kg)  Height: 5\' 8"  (1.727 m)   Body mass index is 22.96 kg/m.  General: Frail elderly African-American lady, seated, in no evident distress Head: head normocephalic and atraumatic.  Neck: supple with no carotid or supraclavicular bruits Cardiovascular: regular rate and rhythm, no murmurs Musculoskeletal: no deformity Skin:  no rash/petichiae Vascular:  Normal pulses all extremities  Neurologic Exam Mental Status: Awake and fully alert. Oriented to place and time. Recent and remote memory intact. Attention span, concentration and fund of knowledge appropriate. Mood and affect appropriate.  Cranial Nerves: Pupils equal, briskly reactive to light. Extraocular movements full without nystagmus. Visual fields full to confrontation. Hearing intact. Facial sensation intact. Face, tongue, palate moves normally and symmetrically.  Motor: Normal bulk and tone. Normal strength in all tested extremity muscles. Sensory.: intact to touch ,pinprick .position and vibratory sensation.  Coordination: Rapid alternating movements normal in all extremities. Finger-to-nose and heel-to-shin performed accurately bilaterally. Gait and Station: Arises from chair without difficulty. Stance is normal. Gait demonstrates normal stride length with slight imbalance and use of cane.  Not able to heel, toe and tandem walk without difficulty.  Reflexes: 1+ and symmetric. Toes downgoing.      ASSESSMENT: 78 year old African-American lady with small right cerebellar infarct in March 2020 secondary to vertebral artery stenosis with recurrent episodes of transient dizziness and headaches possibly vertebrobasilar ischemia.  Multiple vascular risk factors of diabetes, hypertension,  hyperlipidemia and stroke.  Residual deficits of intermittent dizziness but overall greatly improving    PLAN: -Continue aspirin Plavix and pravastatin for secondary stroke prevention -Discussion again regarding cerebral angiogram but patient wishes to continue to hold off on additional testing  at this time -Continue to follow with PCP for HTN, HLD and DM management -Advised patient to continue to monitor blood pressure at home and if remains elevated, follow-up with PCP -maintain strict control of hypertension with blood pressure goal below 130/90, lipids with LDL cholesterol goal below 70 mg percent and diabetes with hemoglobin A1c goal below 6.5%.    Overall stable from stroke standpoint recommend follow-up as needed.  Also advised to call office in the future when she wishes to pursue additional testing   Greater than 50% of time during this 25 minute visit was spent on counseling,explanation of diagnosis, planning of further management, discussion with patient and family and coordination of care  Frann Rider, [facility]  [facility] [address] [place], [place] [address]  Phone [phone] Fax [phone] Note: This document was prepared with digital dictation and possible smart phrase technology. Any transcriptional errors that result from this process are unintentional.

## 2019-06-23 NOTE — Progress Notes (Signed)
I agree with the above plan 

## 2019-07-26 ENCOUNTER — Ambulatory Visit: Payer: Medicare HMO | Admitting: Internal Medicine

## 2019-07-28 ENCOUNTER — Encounter: Payer: Self-pay | Admitting: Internal Medicine

## 2019-07-28 ENCOUNTER — Ambulatory Visit: Payer: Medicare HMO | Attending: Internal Medicine | Admitting: Internal Medicine

## 2019-07-28 ENCOUNTER — Other Ambulatory Visit: Payer: Self-pay

## 2019-07-28 VITALS — BP 162/90 | HR 71 | Resp 16 | Wt 144.6 lb

## 2019-07-28 DIAGNOSIS — I10 Essential (primary) hypertension: Secondary | ICD-10-CM

## 2019-07-28 DIAGNOSIS — Z8673 Personal history of transient ischemic attack (TIA), and cerebral infarction without residual deficits: Secondary | ICD-10-CM | POA: Diagnosis not present

## 2019-07-28 DIAGNOSIS — N1832 Chronic kidney disease, stage 3b: Secondary | ICD-10-CM | POA: Diagnosis not present

## 2019-07-28 DIAGNOSIS — E210 Primary hyperparathyroidism: Secondary | ICD-10-CM

## 2019-07-28 DIAGNOSIS — R748 Abnormal levels of other serum enzymes: Secondary | ICD-10-CM | POA: Diagnosis not present

## 2019-07-28 DIAGNOSIS — K625 Hemorrhage of anus and rectum: Secondary | ICD-10-CM

## 2019-07-28 DIAGNOSIS — M810 Age-related osteoporosis without current pathological fracture: Secondary | ICD-10-CM | POA: Diagnosis not present

## 2019-07-28 MED ORDER — AMLODIPINE BESYLATE 5 MG PO TABS: 5.0000 mg | ORAL_TABLET | Freq: Every day | ORAL | 0 refills | Status: DC

## 2019-07-28 NOTE — Progress Notes (Signed)
Patient ID: Tina Waller, female    DOB: May 03, 1941  MRN: [id number]  CC: Hypertension   Subjective: Tina Waller is a 79 y.o. female who presents for chronic ds management Her concerns today include:  Patient with history ofHTN,HL, vit D deficiency, osteoporosis on Dexascandone fall 2017,primary hyperparathyroidism,depression,CVA in 2008and 09/2018, vertebral artery stenosisand gout.   HTN:  Continues to check BP daily.  She has log with her.  Recent readings starting with this morning:  133/59, 140/67, 175/70, 128/64, 132/70.  Blood pressure is elevated here today.  Patient attributes this to being anxious to leave before the son sets that she has problems driving at nights Compliant with meds and salt restriction No CP/SOB/LE edema  CVA:  Saw neurology 1 mth ago.  She will continue with pravastatin, Plavix.  She denies any falls or dizziness.  Rectal bleeding:  Blood with BM about 2 x a wk.  BM soft, not having to strain.  Still does not want colonoscopy.  Osteoporosis/primary hyperparathyroidism:  Taking Vit D.  Not taking Calcium supplement.  She had seen Dr. Loanne Drilling in the past and at one point was on Boniva once a month but she has been off of that for over a year.  Had parathyroid scintigraphy last year which revealed normal uptake with no evidence of parathyroid adenoma.  CKD:  Has appt next wk with nephrologist Patient Active Problem List   Diagnosis Date Noted  . Microscopic hematuria 09/24/2018  . Tonsillar mass 09/24/2018  . Rectal bleeding 09/24/2018  . Insomnia 09/24/2018  . Hypertensive urgency 09/14/2018  . Anxiety 09/14/2018  . Acute CVA (cerebrovascular accident) ([place]) 09/14/2018  . Primary hyperparathyroidism ([place]) 02/10/2018  . Vitamin B 12 deficiency 12/11/2017  . Pulmonary hypertension, mild ([place]) 12/11/2017  . CKD (chronic kidney disease) stage 3, GFR 30-59 ml/min 04/17/2017  . Stasis dermatitis of both legs 04/17/2017  . Osteoporosis  12/22/2016  . Hx of gout 12/22/2016  . Estrogen deficiency 05/12/2016  . Vitamin D deficiency 05/12/2016  . Weight loss 05/12/2016  . HTN (hypertension) 06/12/2015  . History of CVA (cerebrovascular accident) 03/28/2015     Current Outpatient Medications on File Prior to Visit  Medication Sig Dispense Refill  . acetaminophen (TYLENOL) 500 MG tablet Take 500-1,000 mg by mouth every 6 (six) hours as needed for moderate pain or headache.    Marland Kitchen amLODipine (NORVASC) 5 MG tablet Take 1 tablet (5 mg total) by mouth daily. 90 tablet 0  . cholecalciferol (VITAMIN D3) 25 MCG (1000 UT) tablet Take 1,000 Units by mouth daily.    . clopidogrel (PLAVIX) 75 MG tablet Take 1 tablet (75 mg total) by mouth daily. 90 tablet 1  . furosemide (LASIX) 40 MG tablet TAKE 1 TABLET EVERY DAY 90 tablet 1  . losartan (COZAAR) 100 MG tablet Take 1 tablet (100 mg total) by mouth daily. 90 tablet 3  . meclizine (ANTIVERT) 25 MG tablet Take 1 tablet (25 mg total) by mouth 3 (three) times daily as needed for dizziness. 30 tablet 0  . metoprolol succinate (TOPROL-XL) 50 MG 24 hr tablet Take 2 tablets (100 mg total) by mouth daily. 180 tablet 3  . polyethylene glycol (MIRALAX) packet Take 17 g by mouth daily as needed for mild constipation.    . potassium chloride (KLOR-CON) 8 MEQ tablet Take 2 tablets (16 mEq total) by mouth daily. 180 tablet 1  . pravastatin (PRAVACHOL) 80 MG tablet Take 1 tablet (80 mg total) by mouth daily. For high  cholesterol 90 tablet 2  . triamcinolone cream (KENALOG) 0.1 % Apply 1 application topically 2 (two) times daily as needed (rash).    . vitamin B-12 (CYANOCOBALAMIN) 1000 MCG tablet Take 1,000 mcg by mouth daily.     No current facility-administered medications on file prior to visit.    Allergies  Allergen Reactions  . Lisinopril Cough  . Lipitor [Atorvastatin] Diarrhea    Social History   Socioeconomic History  . Marital status: Divorced    Spouse name: Not on file  . Number of  children: Not on file  . Years of education: Not on file  . Highest education level: Not on file  Occupational History  . Not on file  Tobacco Use  . Smoking status: Former Smoker    Types: Cigarettes    Quit date: 07/14/2006    Years since quitting: 13.0  . Smokeless tobacco: Never Used  Substance and Sexual Activity  . Alcohol use: Yes    Alcohol/week: 7.0 standard drinks    Types: 7 Shots of liquor per week    Comment: 2020  " I drink twice a week "  . Drug use: No  . Sexual activity: Not on file  Other Topics Concern  . Not on file  Social History Narrative  . Not on file   Social Determinants of Health   Financial Resource Strain:   . Difficulty of Paying Living Expenses: Not on file  Food Insecurity:   . Worried About Charity fundraiser in the Last Year: Not on file  . Ran Out of Food in the Last Year: Not on file  Transportation Needs:   . Lack of Transportation (Medical): Not on file  . Lack of Transportation (Non-Medical): Not on file  Physical Activity:   . Days of Exercise per Week: Not on file  . Minutes of Exercise per Session: Not on file  Stress:   . Feeling of Stress : Not on file  Social Connections:   . Frequency of Communication with Friends and Family: Not on file  . Frequency of Social Gatherings with Friends and Family: Not on file  . Attends Religious Services: Not on file  . Active Member of Clubs or Organizations: Not on file  . Attends Archivist Meetings: Not on file  . Marital Status: Not on file  Intimate Partner Violence:   . Fear of Current or Ex-Partner: Not on file  . Emotionally Abused: Not on file  . Physically Abused: Not on file  . Sexually Abused: Not on file    Family History  Problem Relation Age of Onset  . Heart disease Mother        CHF  . Osteoporosis Neg Hx   . Colon cancer Neg Hx   . Stomach cancer Neg Hx   . Pancreatic cancer Neg Hx     Past Surgical History:  Procedure Laterality Date  . ABDOMINAL  HYSTERECTOMY    . BREAST SURGERY     lumpectomy  . BUNIONECTOMY    . CATARACT EXTRACTION Left   . COLONOSCOPY W/ BIOPSIES AND POLYPECTOMY    . MULTIPLE TOOTH EXTRACTIONS    . TONGUE BIOPSY Right 12/13/2018   Procedure: RIGHT TONSIL BIOPSY;  Surgeon: Melida Quitter, MD;  Location: [facility];  Service: ENT;  Laterality: Right;    ROS: Review of Systems Negative except as stated above  PHYSICAL EXAM: BP (!) 162/90   Pulse 71   Resp 16   Wt 144 lb  9.6 oz (65.6 kg)   SpO2 96%   BMI 21.99 kg/m   Wt Readings from Last 3 Encounters:  07/28/19 144 lb 9.6 oz (65.6 kg)  06/22/19 151 lb (68.5 kg)  04/22/19 144 lb (65.3 kg)   Physical Exam  General appearance - alert, well appearing, and in no distress Mental status - normal mood, behavior, speech, dress, motor activity, and thought processes Mouth - mucous membranes moist, pharynx normal without lesions Neck - supple, no significant adenopathy Chest - clear to auscultation, no wheezes, rales or rhonchi, symmetric air entry Heart - normal rate, regular rhythm, normal S1, S2, no murmurs, rubs, clicks or gallops Extremities -trace to 1+ edema of the ankles right greater than left.  CMP Latest Ref Rng & Units 04/22/2019 12/09/2018 10/14/2018  Glucose 70 - 99 mg/dL - 106(H) 106(H)  BUN 8 - 23 mg/dL - 34(H) 20  Creatinine 0.44 - 1.00 mg/dL - 1.62(H) 1.10(H)  Sodium 135 - 145 mmol/L - 138 139  Potassium 3.5 - 5.1 mmol/L - 4.3 4.3  Chloride 98 - 111 mmol/L - 106 100  CO2 22 - 32 mmol/L - 23 22  Calcium 8.9 - 10.3 mg/dL - 9.4 9.7  Total Protein 6.0 - 8.5 g/dL 7.2 - -  Total Bilirubin 0.0 - 1.2 mg/dL 0.4 - -  Alkaline Phos 39 - 117 IU/L 191(H) - -  AST 0 - 40 IU/L 17 - -  ALT 0 - 32 IU/L 12 - -   Lipid Panel     Component Value Date/Time   CHOL 163 04/22/2019 1552   TRIG 96 04/22/2019 1552   HDL 53 04/22/2019 1552   CHOLHDL 3.1 04/22/2019 1552   CHOLHDL 5.2 09/14/2018 0511   VLDL 15 09/14/2018 0511   LDLCALC 92 04/22/2019 1552     CBC    Component Value Date/Time   WBC 6.6 04/22/2019 1552   WBC 6.7 12/09/2018 1350   RBC 4.04 04/22/2019 1552   RBC 4.14 12/09/2018 1350   HGB 12.2 04/22/2019 1552   HCT 35.7 04/22/2019 1552   PLT 271 04/22/2019 1552   MCV 88 04/22/2019 1552   MCH 30.2 04/22/2019 1552   MCH 30.7 12/09/2018 1350   MCHC 34.2 04/22/2019 1552   MCHC 33.2 12/09/2018 1350   RDW 12.3 04/22/2019 1552   LYMPHSABS 1.6 09/29/2018 1218   LYMPHSABS 1.0 11/12/2016 1132   MONOABS 0.6 09/29/2018 1218   EOSABS 0.3 09/29/2018 1218   EOSABS 0.1 11/12/2016 1132   BASOSABS 0.0 09/29/2018 1218   BASOSABS 0.0 11/12/2016 1132    ASSESSMENT AND PLAN: 1. Essential hypertension Home blood pressure readings are acceptable.  Blood pressure today in the office is elevated but patient states she is a bit anxious.  No changes made in her current medicines.  Continue current medicines and low-salt diet - amLODipine (NORVASC) 5 MG tablet; Take 1 tablet (5 mg total) by mouth daily.  Dispense: 90 tablet; Refill: 0 - CBC - Comprehensive metabolic panel  2. Primary hyperparathyroidism (HCC) - PTH, Intact and Calcium - DG Bone Density; Future  3. Stage 3b chronic kidney disease Followed by nephrology.  She knows to not take NSAIDs  4. History of cerebrovascular accident (CVA) involving cerebellum Stable on Pravachol and Plavix  5. Rectal bleeding We will recheck CBC today.  Encouraged her to consider having the colonoscopy but she is adamant that she does not want it  6. Osteoporosis, unspecified osteoporosis type, unspecified pathological fracture presence - DG Bone Density; Future  7. Elevated alkaline phosphatase level - Mitochondrial Antibodies - TSH    Patient was given the opportunity to ask questions.  Patient verbalized understanding of the plan and was able to repeat key elements of the plan.   No orders of the defined types were placed in this encounter.    Requested Prescriptions    No  prescriptions requested or ordered in this encounter    No follow-ups on file.  Karle Plumber, MD, FACP

## 2019-08-01 LAB — COMPREHENSIVE METABOLIC PANEL
ALT: 10 IU/L (ref 0–32)
AST: 14 IU/L (ref 0–40)
Albumin/Globulin Ratio: 1.5 (ref 1.2–2.2)
Albumin: 4.5 g/dL (ref 3.7–4.7)
Alkaline Phosphatase: 195 IU/L — ABNORMAL HIGH (ref 39–117)
BUN/Creatinine Ratio: 15 (ref 12–28)
BUN: 20 mg/dL (ref 8–27)
Bilirubin Total: 0.4 mg/dL (ref 0.0–1.2)
CO2: 20 mmol/L (ref 20–29)
Calcium: 9.8 mg/dL (ref 8.7–10.3)
Chloride: 104 mmol/L (ref 96–106)
Creatinine, Ser: 1.3 mg/dL — ABNORMAL HIGH (ref 0.57–1.00)
GFR calc Af Amer: 46 mL/min/{1.73_m2} — ABNORMAL LOW (ref >59)
GFR calc non Af Amer: 40 mL/min/{1.73_m2} — ABNORMAL LOW (ref >59)
Globulin, Total: 3.1 g/dL (ref 1.5–4.5)
Glucose: 99 mg/dL (ref 65–99)
Potassium: 4.7 mmol/L (ref 3.5–5.2)
Sodium: 139 mmol/L (ref 134–144)
Total Protein: 7.6 g/dL (ref 6.0–8.5)

## 2019-08-01 LAB — CBC
Hematocrit: 38.8 % (ref 34.0–46.6)
Hemoglobin: 13.1 g/dL (ref 11.1–15.9)
MCH: 30.8 pg (ref 26.6–33.0)
MCHC: 33.8 g/dL (ref 31.5–35.7)
MCV: 91 fL (ref 79–97)
Platelets: 266 10*3/uL (ref 150–450)
RBC: 4.25 x10E6/uL (ref 3.77–5.28)
RDW: 13.1 % (ref 11.7–15.4)
WBC: 6.5 10*3/uL (ref 3.4–10.8)

## 2019-08-01 LAB — PTH, INTACT AND CALCIUM: PTH: 79 pg/mL — ABNORMAL HIGH (ref 15–65)

## 2019-08-01 LAB — TSH: TSH: 1.3 u[IU]/mL (ref 0.450–4.500)

## 2019-08-01 LAB — MITOCHONDRIAL ANTIBODIES: Mitochondrial Ab: <20 Units (ref 0.0–20.0)

## 2019-08-03 DIAGNOSIS — I129 Hypertensive chronic kidney disease with stage 1 through stage 4 chronic kidney disease, or unspecified chronic kidney disease: Secondary | ICD-10-CM | POA: Diagnosis not present

## 2019-08-03 DIAGNOSIS — R809 Proteinuria, unspecified: Secondary | ICD-10-CM | POA: Diagnosis not present

## 2019-08-03 DIAGNOSIS — N183 Chronic kidney disease, stage 3 unspecified: Secondary | ICD-10-CM | POA: Diagnosis not present

## 2019-08-03 DIAGNOSIS — E213 Hyperparathyroidism, unspecified: Secondary | ICD-10-CM | POA: Diagnosis not present

## 2019-08-09 ENCOUNTER — Telehealth: Payer: Self-pay

## 2019-08-09 ENCOUNTER — Other Ambulatory Visit: Payer: Self-pay | Admitting: Internal Medicine

## 2019-08-09 NOTE — Telephone Encounter (Signed)
Contacted pt to go over lab results pt is aware of results and doesn't have any questions or concerns 

## 2019-08-10 NOTE — Telephone Encounter (Signed)
07/28/19 patients potassium level was normal. Please refill if patient should continue daily.

## 2019-09-15 DIAGNOSIS — N183 Chronic kidney disease, stage 3 unspecified: Secondary | ICD-10-CM | POA: Diagnosis not present

## 2019-09-15 DIAGNOSIS — R809 Proteinuria, unspecified: Secondary | ICD-10-CM | POA: Diagnosis not present

## 2019-09-15 DIAGNOSIS — I129 Hypertensive chronic kidney disease with stage 1 through stage 4 chronic kidney disease, or unspecified chronic kidney disease: Secondary | ICD-10-CM | POA: Diagnosis not present

## 2019-09-15 DIAGNOSIS — E213 Hyperparathyroidism, unspecified: Secondary | ICD-10-CM | POA: Diagnosis not present

## 2019-10-31 ENCOUNTER — Other Ambulatory Visit: Payer: Medicare HMO

## 2019-11-09 ENCOUNTER — Encounter: Payer: Self-pay | Admitting: Internal Medicine

## 2019-11-09 NOTE — Progress Notes (Signed)
Received note from Dr. Lorrene Reid who saw patient 09/15/2019.  It was noted that patient had dramatic improvement in her UPC from greater than 3g to 545 mg allowing her to continue to monitor as opposed to pushing patient for renal biopsy.  Patient has had negative SPEP/UPEP, ANA, normal complements, negative double-stranded DNA, normal urine total LC excretion, normal urine immunofixation. Labs on that visit revealed creatinine of 1.39, BUN of 25, estimated GFR of 42.

## 2019-12-19 ENCOUNTER — Other Ambulatory Visit: Payer: Self-pay | Admitting: Internal Medicine

## 2019-12-19 DIAGNOSIS — I10 Essential (primary) hypertension: Secondary | ICD-10-CM

## 2019-12-20 ENCOUNTER — Other Ambulatory Visit: Payer: Self-pay | Admitting: Internal Medicine

## 2019-12-27 NOTE — Progress Notes (Signed)
Patient ID: Tina Waller, female    DOB: March 29, 1941  MRN: [id number]  CC: Hypertension Follow-Up  Subjective: Tina Waller is a 79 y.o. female with history of hypertension, pulmonary hypertension, acute cerebrovascular accident, tonsillar mass, rectal bleeding, primary hyperparathyroidism, osteoporosis, chronic kidney disease stage 3 GFR 30 - 59, vitamin D deficiency, estrogen deficiency, gout, vitamin B12 deficiency, anxiety, and insomnia who presents for  1. HYPERTENSION FOLLOW-UP:  Currently taking: see medication list Have you taken your blood pressure medication today: '[]'  Yes '[x]'  No  Med Adherence: '[x]'  Yes    '[]'  No Medication side effects: '[]'  Yes    '[x]'  No  Adherence with salt restriction: '[x]'  Yes    '[]'  No Exercise: Yes '[]'  No '[x]'  Home Monitoring?: '[x]'  Yes    '[]'  No Monitoring Frequency: '[x]'  Yes    '[]'  No Home BP results range: '[x]'  Yes  May 26 June 2: 136/76, 120/69, 164/92, 173/92, 167/93, 180/91, 114/65, 136/73 June 3 June 10: 122/63, 158/88, 162/94, 140/84, 147/81, 149/78, 130/79, 156/98  June 11 - June 17: 117/69, 133/79, 135/82, 127/69, 128/70, 153/120, 127/78 Smoking '[]'  Yes '[x]'  No SOB? '[x]'  Yes with exertion Chest Pain?: '[]'  Yes    '[x]'  No  Leg swelling?: '[]'  Yes    '[x]'  No Headaches?: '[]'  Yes    '[x]'  No Dizziness? '[]'  Yes    '[x]'  No Comments:  Last visit 1/14/2021with Tina Waller. During that encounter home blood pressure readings were acceptable. Blood pressure was elevated in office and patient stated she was a little anxious. No changes were made in her current medications. Amlodipine continued. CMP and CBC obtained.   2. HYPERPARATHYROIDISM FOLLOW-UP: Last visit 07/28/2019 with Dr Wynetta Waller. During that encounter PTH intact and calcium obtained. Bone density scheduled for future.  Today patient reports she has not gotten bone density yet. Reports she has plans to do so soon. Bone density was originally scheduled in [place], [place]. Reports she will be unable to drive  there because of the traffic and doesn't have anyone else to take her. Prefers to have it done in [place].   3. CHRONIC KIDNEY DISEASE FOLLOW-UP: Last visit 07/28/2019 with Tina Waller. During that encounter patient continued to be followed by Nephrology. Patient educated to not take NSAIDs.   Last visit 09/15/2019 with Dr. Lorrene Waller. During that encounter it was noted that patient had a significant improvement in her UPC from greater than 3g to 545 mg allowing her to continue to monitor as opposed to having a renal biopsy. Patient had negative SPEP/UPEP, ANA, normal complements, negative double-stranded DNA, normal urine total LC excretion, normal urine immunofixation. Labs on that visit revealed creatinine of 1.39, BUN of 25,  and estimated GFR of 42.  Today denies use of NSAIDs. Reports she is not sure about having the renal biopsy. Reports she has an appointment scheduled for July 2021.  4. CVA FOLLOW-UP:  Last visit 07/28/2019 with Tina Waller. During that encounter patient stable on Pravachol and Plavix.   Last visit 06/22/2019 with neurology nurse practitioner Tina Waller. During that encounter continued on Aspirin, Plavix, and Pravastatin for secondary stroke prevention. Discussed cerebral angiogram but patient wished to continue to hold off for additional testing at that time. Patient advised to continue to monitor blood pressure at home and if remains elevated to follow-up with PCP. Patient determined to be stable from stroke standpoint and to follow-up as needed and to call office when she wishes to pursure additional testing.   Today reports Neurology said she did  not need to return for a visit unless she has headaches and as needed. Today she reports she has not had any recent headaches. Reports Neurology wants to put dye in her head to see if she has any blockages. Reports she has held off on the procedure because she would be unable to drive herself and does not have anyone else to take her.  5.  RECTAL BLEEDING FOLLOW-UP:  Last visit 07/28/2019 with Tina Waller. During that encounter CBC rechecked. Patient was encouraged to consider having colonoscopy but patient was adamant that she did not want it.  Today she denies interest in colonoscopy stating "I never want that again." Reports her last was in 2019. States at that time polyps were found and removed. Reports still bleeding with every bowel movement. Reports last month she had a black movement with mucous and a bad smell which frightened her. Also, report cost of pre-operative medication costs $180 which is keeping her away from getting a repeat colonoscopy. Says she doesn't like not being able to drink or drink prior to procedure.  6. OSTEOPOROSIS FOLLOW-UP:  Last visit 07/28/2019 with Tina Waller. During that encounter bone density scheduled for future.   Today patient reports she has not gotten bone density yet. Reports she has plans to do so soon. Bone density was originally scheduled in [place], [place]. Reports she will be unable to drive there because of the traffic and doesn't have anyone else to take her. Prefers to have it done in [place].   7. ELEVATED ALKALINE PHOSPHATASE LEVEL FOLLOW-UP:  Last visit 07/28/2019 with Tina Waller. During that encounter mitochondrial antibodies and TSH obtained.    Patient Active Problem List   Diagnosis Date Noted  . Elevated alkaline phosphatase level 07/28/2019  . Microscopic hematuria 09/24/2018  . Tonsillar mass 09/24/2018  . Rectal bleeding 09/24/2018  . Insomnia 09/24/2018  . Hypertensive urgency 09/14/2018  . Anxiety 09/14/2018  . Acute CVA (cerebrovascular accident) ([facility]) 09/14/2018  . Primary hyperparathyroidism ([place]) 02/10/2018  . Vitamin B 12 deficiency 12/11/2017  . Pulmonary hypertension, mild ([place]) 12/11/2017  . CKD (chronic kidney disease) stage 3, GFR 30-59 ml/min 04/17/2017  . Stasis dermatitis of both legs 04/17/2017  . Osteoporosis 12/22/2016  . Hx of gout  12/22/2016  . Estrogen deficiency 05/12/2016  . Vitamin D deficiency 05/12/2016  . Weight loss 05/12/2016  . HTN (hypertension) 06/12/2015  . History of CVA (cerebrovascular accident) 03/28/2015     Current Outpatient Medications on File Prior to Visit  Medication Sig Dispense Refill  . acetaminophen (TYLENOL) 500 MG tablet Take 500-1,000 mg by mouth every 6 (six) hours as needed for moderate pain or headache.    Marland Kitchen amLODipine (NORVASC) 5 MG tablet Take 1 tablet (5 mg total) by mouth daily. 90 tablet 0  . cholecalciferol (VITAMIN D3) 25 MCG (1000 UT) tablet Take 1,000 Units by mouth daily.    . clopidogrel (PLAVIX) 75 MG tablet TAKE 1 TABLET (75 MG TOTAL) BY MOUTH DAILY. 90 tablet 0  . furosemide (LASIX) 40 MG tablet TAKE 1 TABLET EVERY DAY 90 tablet 0  . losartan (COZAAR) 100 MG tablet Take 1 tablet (100 mg total) by mouth daily. 90 tablet 3  . meclizine (ANTIVERT) 25 MG tablet Take 1 tablet (25 mg total) by mouth 3 (three) times daily as needed for dizziness. 30 tablet 0  . metoprolol succinate (TOPROL-XL) 50 MG 24 hr tablet Take 2 tablets (100 mg total) by mouth daily. 180 tablet 3  . polyethylene glycol (  MIRALAX) packet Take 17 g by mouth daily as needed for mild constipation.    . potassium chloride (KLOR-CON) 8 MEQ tablet TAKE 2 TABLETS EVERY DAY 180 tablet 3  . pravastatin (PRAVACHOL) 80 MG tablet Take 1 tablet (80 mg total) by mouth daily. For high cholesterol 90 tablet 2  . triamcinolone cream (KENALOG) 0.1 % Apply 1 application topically 2 (two) times daily as needed (rash).    . vitamin B-12 (CYANOCOBALAMIN) 1000 MCG tablet Take 1,000 mcg by mouth daily.     No current facility-administered medications on file prior to visit.    Allergies  Allergen Reactions  . Lisinopril Cough  . Lipitor [Atorvastatin] Diarrhea    Social History   Socioeconomic History  . Marital status: Divorced    Spouse name: Not on file  . Number of children: Not on file  . Years of education:  Not on file  . Highest education level: Not on file  Occupational History  . Not on file  Tobacco Use  . Smoking status: Former Smoker    Types: Cigarettes    Quit date: 07/14/2006    Years since quitting: 13.4  . Smokeless tobacco: Never Used  Vaping Use  . Vaping Use: Never used  Substance and Sexual Activity  . Alcohol use: Yes    Alcohol/week: 7.0 standard drinks    Types: 7 Shots of liquor per week    Comment: 2020  " I drink twice a week "  . Drug use: No  . Sexual activity: Not on file  Other Topics Concern  . Not on file  Social History Narrative  . Not on file   Social Determinants of Health   Financial Resource Strain:   . Difficulty of Paying Living Expenses:   Food Insecurity:   . Worried About Charity fundraiser in the Last Year:   . Arboriculturist in the Last Year:   Transportation Needs:   . Film/video editor (Medical):   Marland Kitchen Lack of Transportation (Non-Medical):   Physical Activity:   . Days of Exercise per Week:   . Minutes of Exercise per Session:   Stress:   . Feeling of Stress :   Social Connections:   . Frequency of Communication with Friends and Family:   . Frequency of Social Gatherings with Friends and Family:   . Attends Religious Services:   . Active Member of Clubs or Organizations:   . Attends Archivist Meetings:   Marland Kitchen Marital Status:   Intimate Partner Violence:   . Fear of Current or Ex-Partner:   . Emotionally Abused:   Marland Kitchen Physically Abused:   . Sexually Abused:     Family History  Problem Relation Age of Onset  . Heart disease Mother        CHF  . Osteoporosis Neg Hx   . Colon cancer Neg Hx   . Stomach cancer Neg Hx   . Pancreatic cancer Neg Hx     Past Surgical History:  Procedure Laterality Date  . ABDOMINAL HYSTERECTOMY    . BREAST SURGERY     lumpectomy  . BUNIONECTOMY    . CATARACT EXTRACTION Left   . COLONOSCOPY W/ BIOPSIES AND POLYPECTOMY    . MULTIPLE TOOTH EXTRACTIONS    . TONGUE BIOPSY Right  12/13/2018   Procedure: RIGHT TONSIL BIOPSY;  Surgeon: Melida Quitter, MD;  Location: [facility];  Service: ENT;  Laterality: Right;    ROS: Review of Systems Negative except  as stated above  PHYSICAL EXAM: Vitals with BMI 12/29/2019 07/28/2019 06/22/2019  Height - - '5\' 8"'   Weight 142 lbs 13 oz 144 lbs 10 oz 151 lbs  BMI - - 49.20  Systolic 100 712 197  Diastolic 77 90 92  Pulse 64 71 71  SpO2- 97%, room air  Temperature- 97.42F, oral  Physical Exam General appearance - alert, well appearing, and in no distress and oriented to person, place, and time Mental status - alert, oriented to person, place, and time, normal mood, behavior, speech, dress, motor activity, and thought processes Neck - supple, no significant adenopathy Lymphatics - no palpable lymphadenopathy, no hepatosplenomegaly Chest - clear to auscultation, no wheezes, rales or rhonchi, symmetric air entry, no tachypnea, retractions or cyanosis Heart - normal rate, regular rhythm, normal S1, S2, no murmurs, rubs, clicks or gallops Neurological - alert, oriented, normal speech, no focal findings or movement disorder noted, neck supple without rigidity, cranial nerves II through XII intact, funduscopic exam normal, discs flat and sharp, DTR's normal and symmetric, motor and sensory grossly normal bilaterally, normal muscle tone, no tremors, strength 5/5, Romberg sign negative, normal gait and station Musculoskeletal - full range of motion without pain Extremities - peripheral pulses normal, no clubbing or cyanosis, pedal edema 2 +  ASSESSMENT AND PLAN: 1. Essential hypertension: -Blood pressure elevated during this visit. Patient asymptomatic without chest pain, palpitations, and shortness of breath. Reports she is a bit anxious on today. -Patient reports her blood pressures at home fluctuates. Patient's blood pressure overall uncontrolled. Patient with home blood pressure logs with wide ranges from 117-180/63-94. -Increase Furosemide  from 40 mg/daily to 40 mg/twice daily.  -Follow-up with clinical pharmacist in 1 week for blood pressure check in regards to increasing Furosemide. Check blood pressure twice daily and record results. Bring those results to appointment in [redacted] weeks along with home monitor. -Repeat BMP in 1 week to evaluate kidney function in regards to increasing Furosemide. -Amlodipine cannot be increase to a higher dose as this causes lower extremity edema for her. -Continue Amlodipine, Losartan, and Metoprolol Succinate as prescribed.  -Counseled patient on potential side effects of medication increase such as but not limited to lightheadedness or dizziness. Should the patient experience any of these symptoms patient should discontinue medication immediately and notify provider or seek medical attention immediately if severe. -Counseled on blood pressure goal of less than 130/80, low-sodium, DASH diet, medication compliance, 150 minutes of moderate intensity exercise per week as tolerated. Discussed medication compliance, adverse effects. -CMP today to evaluate kidney function, liver function, and electrolyte balance. -CBC today to evaluate blood count. -Follow-up with primary physician in 3 months or sooner if needed. - CMP14+EGFR - CBC With Differential - Basic Metabolic Panel; Future - amLODipine (NORVASC) 5 MG tablet; Take 1 tablet (5 mg total) by mouth daily.  Dispense: 90 tablet; Refill: 0 - metoprolol succinate (TOPROL-XL) 50 MG 24 hr tablet; Take 2 tablets (100 mg total) by mouth daily.  Dispense: 180 tablet; Refill: 0 - losartan (COZAAR) 100 MG tablet; Take 1 tablet (100 mg total) by mouth daily.  Dispense: 90 tablet; Refill: 0 - furosemide (LASIX) 40 MG tablet; Take 1 tablet (40 mg total) by mouth 2 (two) times daily.  Dispense: 180 tablet; Refill: 0 - Amb Referral to Clinical Pharmacist  2. Primary hyperparathyroidism ([place]): -Patient initially referred for bone density in January 2021. Patient reports  she did not make appointment because the appointment was in [place], [place]. Today reports she would like  to have bone density if it can be scheduled for [place], [place].   -Bone density recommended for primary hyperparathyroidism. - DG Bone Density; Future   3. Stage 3b chronic kidney disease: -Reports she is still not sure about having the renal biopsy. Reports she has an appointment scheduled for July 2021 with Nephrology.  -Counseled patient not to take NSAIDs. -Follow-up with Nephrology as scheduled.  4. History of cerebrovascular accident (CVA) involving cerebellum: -Continue Pravastatin and Clopidogrel as prescribed. -Lipid panel to evaluate cholesterol. -Follow-up with Neurology as scheduled. - Lipid panel - pravastatin (PRAVACHOL) 80 MG tablet; Take 1 tablet (80 mg total) by mouth daily. For high cholesterol  Dispense: 90 tablet; Refill: 0  5. Rectal bleeding: -Patient still having blood with bowel movements and black tarry stools.  -Patient declines colonoscopy.  6. Osteoporosis, unspecified osteoporosis type, unspecified pathological fracture presence: -Patient initially referred for bone density in January 2021. Patient reports she did not make appointment because the appointment was in [place], [place]. Today reports she would like to have bone density if it can be scheduled for [place], [place].   - DG Bone Density; Future  7. Pure hypercholesterolemia:  -Continue Pravastatin as prescribed.  -Lipid panel to evaluate cholesterol. - Lipid panel - pravastatin (PRAVACHOL) 80 MG tablet; Take 1 tablet (80 mg total) by mouth daily. For high cholesterol  Dispense: 90 tablet; Refill: 0  Patient was given the opportunity to ask questions.  Patient verbalized understanding of the plan and was able to repeat key elements of the plan. Patient was given clear instructions to go to Emergency Department or return to medical center if symptoms don't improve, worsen, or new problems develop.The  patient verbalized understanding.   Camillia Herter, NP

## 2019-12-29 ENCOUNTER — Encounter: Payer: Self-pay | Admitting: Family

## 2019-12-29 ENCOUNTER — Other Ambulatory Visit: Payer: Self-pay

## 2019-12-29 ENCOUNTER — Ambulatory Visit: Payer: Medicare HMO | Attending: Family | Admitting: Family

## 2019-12-29 VITALS — BP 171/77 | HR 64 | Temp 97.3°F | Resp 16 | Wt 142.8 lb

## 2019-12-29 DIAGNOSIS — E210 Primary hyperparathyroidism: Secondary | ICD-10-CM

## 2019-12-29 DIAGNOSIS — Z8673 Personal history of transient ischemic attack (TIA), and cerebral infarction without residual deficits: Secondary | ICD-10-CM | POA: Diagnosis not present

## 2019-12-29 DIAGNOSIS — M810 Age-related osteoporosis without current pathological fracture: Secondary | ICD-10-CM

## 2019-12-29 DIAGNOSIS — I10 Essential (primary) hypertension: Secondary | ICD-10-CM

## 2019-12-29 DIAGNOSIS — E7800 Pure hypercholesterolemia, unspecified: Secondary | ICD-10-CM

## 2019-12-29 DIAGNOSIS — K625 Hemorrhage of anus and rectum: Secondary | ICD-10-CM

## 2019-12-29 DIAGNOSIS — N1832 Chronic kidney disease, stage 3b: Secondary | ICD-10-CM | POA: Diagnosis not present

## 2019-12-29 MED ORDER — LOSARTAN POTASSIUM 100 MG PO TABS: 100.0000 mg | ORAL_TABLET | Freq: Every day | ORAL | 0 refills | Status: DC

## 2019-12-29 MED ORDER — PRAVASTATIN SODIUM 80 MG PO TABS: 80.0000 mg | ORAL_TABLET | Freq: Every day | ORAL | 0 refills | Status: DC

## 2019-12-29 MED ORDER — METOPROLOL SUCCINATE ER 50 MG PO TB24: 100.0000 mg | ORAL_TABLET | Freq: Every day | ORAL | 0 refills | Status: DC

## 2019-12-29 MED ORDER — AMLODIPINE BESYLATE 5 MG PO TABS: 5.0000 mg | ORAL_TABLET | Freq: Every day | ORAL | 0 refills | Status: DC

## 2019-12-29 MED ORDER — FUROSEMIDE 40 MG PO TABS: 40.0000 mg | ORAL_TABLET | Freq: Two times a day (BID) | ORAL | 0 refills | Status: DC

## 2019-12-29 NOTE — Patient Instructions (Addendum)
Labs on today.  Increase Furosemide (Lasix) to twice daily. Return in 1 week for blood pressure check with clinical pharmacist and repeat lab.  Continue Amlodipine, Losartan, and Metoprolol as prescribed.   Referral to Social Work for patient assistance with transportation.   Referral for bone density scan.   Follow-up with Neurology, Nephrology, and Gastroenterology as needed.  Follow-up with primary physician in 3 months or sooner if needed. Hypertension, Adult Hypertension is another name for high blood pressure. High blood pressure forces your heart to work harder to pump blood. This can cause problems over time. There are two numbers in a blood pressure reading. There is a top number (systolic) over a bottom number (diastolic). It is best to have a blood pressure that is below 120/80. Healthy choices can help lower your blood pressure, or you may need medicine to help lower it. What are the causes? The cause of this condition is not known. Some conditions may be related to high blood pressure. What increases the risk?  Smoking.  Having type 2 diabetes mellitus, high cholesterol, or both.  Not getting enough exercise or physical activity.  Being overweight.  Having too much fat, sugar, calories, or salt (sodium) in your diet.  Drinking too much alcohol.  Having long-term (chronic) kidney disease.  Having a family history of high blood pressure.  Age. Risk increases with age.  Race. You may be at higher risk if you are African American.  Gender. Men are at higher risk than women before age 1. After age 57, women are at higher risk than men.  Having obstructive sleep apnea.  Stress. What are the signs or symptoms?  High blood pressure may not cause symptoms. Very high blood pressure (hypertensive crisis) may cause: ? Headache. ? Feelings of worry or nervousness (anxiety). ? Shortness of breath. ? Nosebleed. ? A feeling of being sick to your stomach  (nausea). ? Throwing up (vomiting). ? Changes in how you see. ? Very bad chest pain. ? Seizures. How is this treated?  This condition is treated by making healthy lifestyle changes, such as: ? Eating healthy foods. ? Exercising more. ? Drinking less alcohol.  Your health care provider may prescribe medicine if lifestyle changes are not enough to get your blood pressure under control, and if: ? Your top number is above 130. ? Your bottom number is above 80.  Your personal target blood pressure may vary. Follow these instructions at home: Eating and drinking   If told, follow the DASH eating plan. To follow this plan: ? Fill one half of your plate at each meal with fruits and vegetables. ? Fill one fourth of your plate at each meal with whole grains. Whole grains include whole-wheat pasta, brown rice, and whole-grain bread. ? Eat or drink low-fat dairy products, such as skim milk or low-fat yogurt. ? Fill one fourth of your plate at each meal with low-fat (lean) proteins. Low-fat proteins include fish, chicken without skin, eggs, beans, and tofu. ? Avoid fatty meat, cured and processed meat, or chicken with skin. ? Avoid pre-made or processed food.  Eat less than 1,500 mg of salt each day.  Do not drink alcohol if: ? Your doctor tells you not to drink. ? You are pregnant, may be pregnant, or are planning to become pregnant.  If you drink alcohol: ? Limit how much you use to:  0-1 drink a day for women.  0-2 drinks a day for men. ? Be aware of how much  alcohol is in your drink. In the [place] one drink equals one 12 oz bottle of beer (355 mL), one 5 oz glass of wine (148 mL), or one 1 oz glass of hard liquor (44 mL). Lifestyle   Work with your doctor to stay at a healthy weight or to lose weight. Ask your doctor what the best weight is for you.  Get at least 30 minutes of exercise most days of the week. This may include walking, swimming, or biking.  Get at least 30  minutes of exercise that strengthens your muscles (resistance exercise) at least 3 days a week. This may include lifting weights or doing Pilates.  Do not use any products that contain nicotine or tobacco, such as cigarettes, e-cigarettes, and chewing tobacco. If you need help quitting, ask your doctor.  Check your blood pressure at home as told by your doctor.  Keep all follow-up visits as told by your doctor. This is important. Medicines  Take over-the-counter and prescription medicines only as told by your doctor. Follow directions carefully.  Do not skip doses of blood pressure medicine. The medicine does not work as well if you skip doses. Skipping doses also puts you at risk for problems.  Ask your doctor about side effects or reactions to medicines that you should watch for. Contact a doctor if you:  Think you are having a reaction to the medicine you are taking.  Have headaches that keep coming back (recurring).  Feel dizzy.  Have swelling in your ankles.  Have trouble with your vision. Get help right away if you:  Get a very bad headache.  Start to feel mixed up (confused).  Feel weak or numb.  Feel faint.  Have very bad pain in your: ? Chest. ? Belly (abdomen).  Throw up more than once.  Have trouble breathing. Summary  Hypertension is another name for high blood pressure.  High blood pressure forces your heart to work harder to pump blood.  For most people, a normal blood pressure is less than 120/80.  Making healthy choices can help lower blood pressure. If your blood pressure does not get lower with healthy choices, you may need to take medicine. This information is not intended to replace advice given to you by your health care provider. Make sure you discuss any questions you have with your health care provider. Document Revised: 03/10/2018 Document Reviewed: 03/10/2018 Elsevier Patient Education  2020 [employer].

## 2019-12-30 ENCOUNTER — Telehealth: Payer: Self-pay | Admitting: Internal Medicine

## 2019-12-30 LAB — CMP14+EGFR
ALT: 5 IU/L (ref 0–32)
AST: 11 IU/L (ref 0–40)
Albumin/Globulin Ratio: 1.4 (ref 1.2–2.2)
Albumin: 4.3 g/dL (ref 3.7–4.7)
Alkaline Phosphatase: 155 IU/L — ABNORMAL HIGH (ref 48–121)
BUN/Creatinine Ratio: 19 (ref 12–28)
BUN: 28 mg/dL — ABNORMAL HIGH (ref 8–27)
Bilirubin Total: 0.5 mg/dL (ref 0.0–1.2)
CO2: 21 mmol/L (ref 20–29)
Calcium: 9.4 mg/dL (ref 8.7–10.3)
Chloride: 107 mmol/L — ABNORMAL HIGH (ref 96–106)
Creatinine, Ser: 1.51 mg/dL — ABNORMAL HIGH (ref 0.57–1.00)
GFR calc Af Amer: 38 mL/min/{1.73_m2} — ABNORMAL LOW (ref >59)
GFR calc non Af Amer: 33 mL/min/{1.73_m2} — ABNORMAL LOW (ref >59)
Globulin, Total: 3 g/dL (ref 1.5–4.5)
Glucose: 99 mg/dL (ref 65–99)
Potassium: 4.7 mmol/L (ref 3.5–5.2)
Sodium: 142 mmol/L (ref 134–144)
Total Protein: 7.3 g/dL (ref 6.0–8.5)

## 2019-12-30 LAB — CBC WITH DIFFERENTIAL
Basophils Absolute: 0 10*3/uL (ref 0.0–0.2)
Basos: 0 %
EOS (ABSOLUTE): 0.2 10*3/uL (ref 0.0–0.4)
Eos: 3 %
Hematocrit: 36.8 % (ref 34.0–46.6)
Hemoglobin: 12.4 g/dL (ref 11.1–15.9)
Immature Grans (Abs): 0 10*3/uL (ref 0.0–0.1)
Immature Granulocytes: 0 %
Lymphocytes Absolute: 1.5 10*3/uL (ref 0.7–3.1)
Lymphs: 26 %
MCH: 31 pg (ref 26.6–33.0)
MCHC: 33.7 g/dL (ref 31.5–35.7)
MCV: 92 fL (ref 79–97)
Monocytes Absolute: 0.7 10*3/uL (ref 0.1–0.9)
Monocytes: 11 %
Neutrophils Absolute: 3.5 10*3/uL (ref 1.4–7.0)
Neutrophils: 60 %
RBC: 4 x10E6/uL (ref 3.77–5.28)
RDW: 12.4 % (ref 11.7–15.4)
WBC: 6 10*3/uL (ref 3.4–10.8)

## 2019-12-30 LAB — LIPID PANEL
Chol/HDL Ratio: 2.9 ratio (ref 0.0–4.4)
Cholesterol, Total: 141 mg/dL (ref 100–199)
HDL: 49 mg/dL (ref >39)
LDL Chol Calc (NIH): 78 mg/dL (ref 0–99)
Triglycerides: 71 mg/dL (ref 0–149)
VLDL Cholesterol Cal: 14 mg/dL (ref 5–40)

## 2019-12-30 NOTE — Telephone Encounter (Signed)
Phone call placed to patient today.  I advised her that I have reviewed my nurse practitioner's recommendation from yesterday where she recommended increasing the furosemide to twice a day.  I told patient that I want her to take the furosemide twice a day only for 2 days then go back to taking it once a day.  Encouraged her to limit salt in the foods which she states she already does.  Patient expressed understanding and was able to repeat back the instructions to me.

## 2020-01-02 NOTE — Progress Notes (Signed)
Kidney function consistent with patient's history of chronic kidney disease stage 3. Patient being seen by kidney doctor on next month.  No anemia.   Cholesterol normal.   Patient was called and counseled to discontinue use of Lasix 40 mg/twice daily and to resume use of Lasix 40 mg/daily. Keep appointment with clinical pharmacist for blood pressure check and repeat labs on 01/05/2020.

## 2020-01-04 ENCOUNTER — Telehealth: Payer: Self-pay

## 2020-01-04 NOTE — Telephone Encounter (Signed)
Contacted pt to go over lab results pt is aware and doesn't have any questions or concerns 

## 2020-01-05 ENCOUNTER — Ambulatory Visit: Payer: Medicare HMO | Attending: Internal Medicine | Admitting: Pharmacist

## 2020-01-05 ENCOUNTER — Other Ambulatory Visit: Payer: Self-pay

## 2020-01-05 DIAGNOSIS — I10 Essential (primary) hypertension: Secondary | ICD-10-CM | POA: Diagnosis not present

## 2020-01-05 NOTE — Progress Notes (Signed)
   S:    PCP: Dr. Wynetta Emery  Patient arrives in good spirits. Presents to the clinic for hypertension management. Patient was referred by Durene Fruits on 12/29/19.   Denies chest pain, shortness of breath, HA or blurred vision.   Patient reports adherence with medications.  Current BP Medications include: amlodipine 5 mg, Losartan 100 mg, furosemide 40 BID,Toprol XL 100 mg daily  Dietary habits include: drinks 1 cup of black tea daily, denies using salt Exercise habits include: doesn't exercise Family / Social history: former smoker (quit in 2008); has 1-2 drinks over the weekend  Home BP readings:  - reported a SBP of 130 this morning   O:  Vitals:   01/05/20 1404  BP: (!) 152/80  Pulse: 62    Last 3 office BP readings: BP Readings from Last 3 Encounters:  01/05/20 (!) 152/80  12/29/19 (!) 171/77  07/28/19 (!) 162/90   BMET    Component Value Date/Time   NA 139 01/05/2020 1403   K 4.4 01/05/2020 1403   CL 104 01/05/2020 1403   CO2 21 01/05/2020 1403   GLUCOSE 95 01/05/2020 1403   GLUCOSE 106 (H) 12/09/2018 1350   BUN 24 01/05/2020 1403   CREATININE 1.37 (H) 01/05/2020 1403   CREATININE 1.35 (H) 04/21/2016 1437   CALCIUM 9.4 01/05/2020 1403   GFRNONAA 37 (L) 01/05/2020 1403   GFRNONAA 39 (L) 04/21/2016 1437   GFRAA 43 (L) 01/05/2020 1403   GFRAA 45 (L) 04/21/2016 1437   Renal function: CrCl cannot be calculated (Unknown ideal weight.).  Clinical ASCVD: Yes  The ASCVD Risk score Mikey Bussing DC Jr., et al., 2013) failed to calculate for the following reasons:   The patient has a prior MI or stroke diagnosis  A/P: Hypertension longstanding currently above goal but improved on current medications. SBP Goal <130 mmHg. Patient is adherent with current medications. Her BP at home this morning was close to goal. Will make no changes at this time.  -Continued other medications  -Counseled on lifestyle modifications for blood pressure control including reduced dietary sodium,  increased exercise, adequate sleep  Results reviewed and written information provided. Total time in face-to-face counseling 15 minutes.   F/U with PCP 03/30/20.  Benard Halsted, PharmD, [place] [phone]

## 2020-01-06 ENCOUNTER — Encounter: Payer: Self-pay | Admitting: Pharmacist

## 2020-01-06 LAB — BASIC METABOLIC PANEL
BUN/Creatinine Ratio: 18 (ref 12–28)
BUN: 24 mg/dL (ref 8–27)
CO2: 21 mmol/L (ref 20–29)
Calcium: 9.4 mg/dL (ref 8.7–10.3)
Chloride: 104 mmol/L (ref 96–106)
Creatinine, Ser: 1.37 mg/dL — ABNORMAL HIGH (ref 0.57–1.00)
GFR calc Af Amer: 43 mL/min/{1.73_m2} — ABNORMAL LOW (ref >59)
GFR calc non Af Amer: 37 mL/min/{1.73_m2} — ABNORMAL LOW (ref >59)
Glucose: 95 mg/dL (ref 65–99)
Potassium: 4.4 mmol/L (ref 3.5–5.2)
Sodium: 139 mmol/L (ref 134–144)

## 2020-01-18 DIAGNOSIS — I129 Hypertensive chronic kidney disease with stage 1 through stage 4 chronic kidney disease, or unspecified chronic kidney disease: Secondary | ICD-10-CM | POA: Diagnosis not present

## 2020-01-18 DIAGNOSIS — E213 Hyperparathyroidism, unspecified: Secondary | ICD-10-CM | POA: Diagnosis not present

## 2020-01-18 DIAGNOSIS — N1832 Chronic kidney disease, stage 3b: Secondary | ICD-10-CM | POA: Diagnosis not present

## 2020-01-18 DIAGNOSIS — R809 Proteinuria, unspecified: Secondary | ICD-10-CM | POA: Diagnosis not present

## 2020-01-20 IMAGING — US US RENAL ARTERY STENOSIS
1 series · 13 of 25 positions shown · non-contrast
Comparison: Prior renal ultrasound 09/04/2017

CLINICAL DATA: 76-year-old female with stage 3 chronic kidney
disease and hypertension

EXAM:
RENAL/URINARY TRACT ULTRASOUND
RENAL DUPLEX DOPPLER ULTRASOUND

[Series 1: us renal artery stenosis · 0.23mm/px · 13 of 99 slices shown]
[im 1/99]
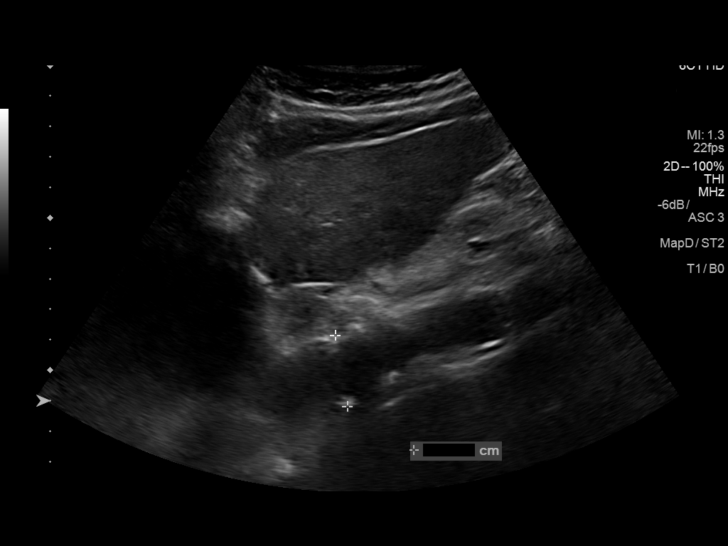
[im 9/99]
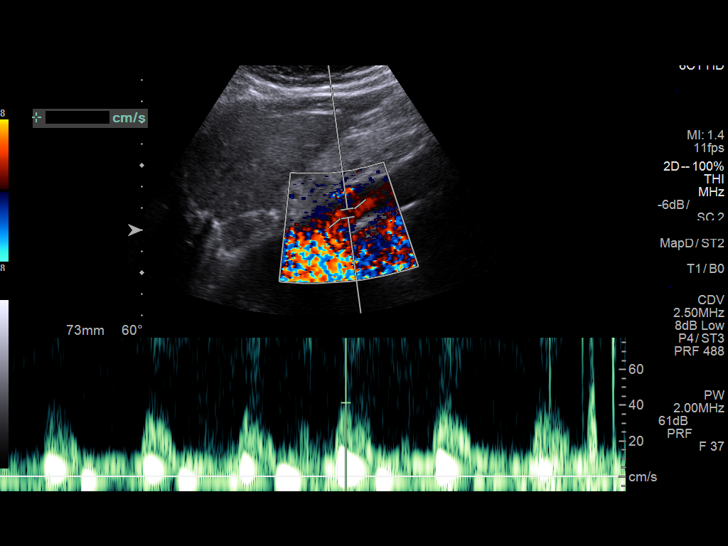
[im 17/99]
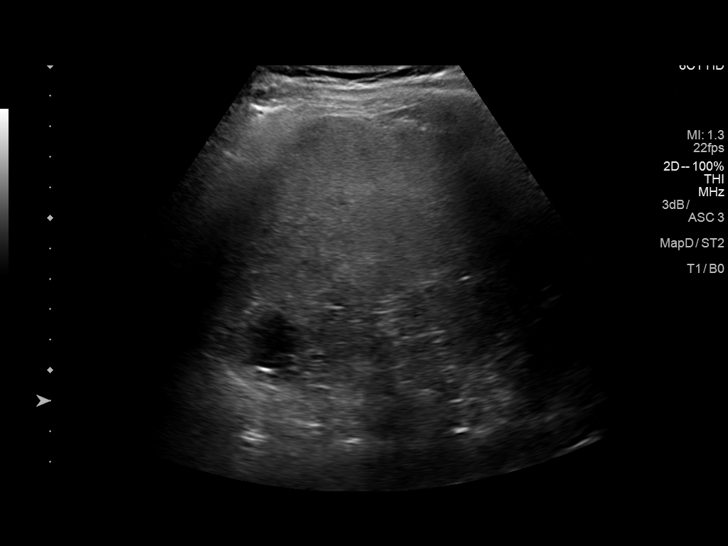
[im 25/99]
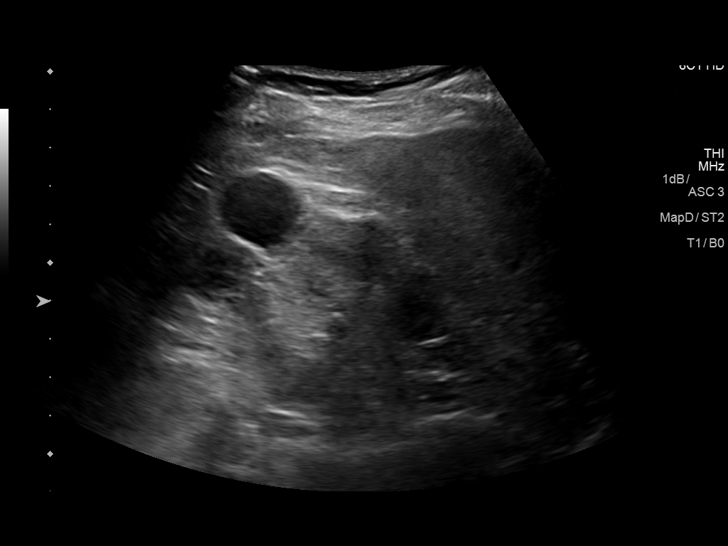
[im 33/99]
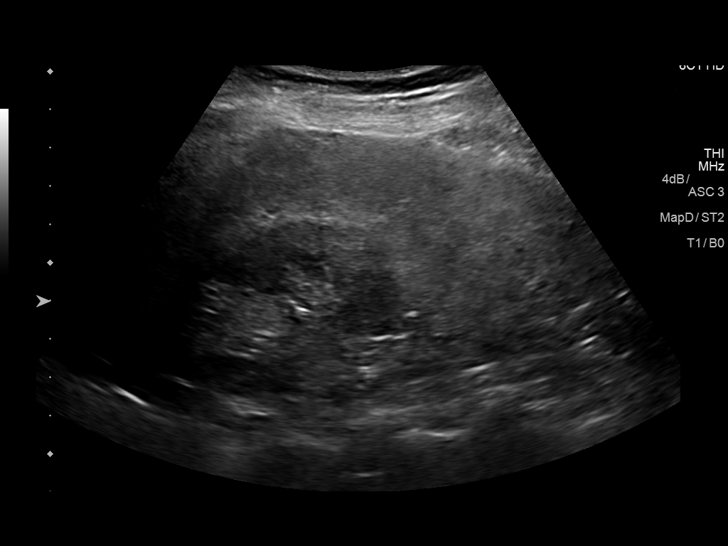
[im 41/99]
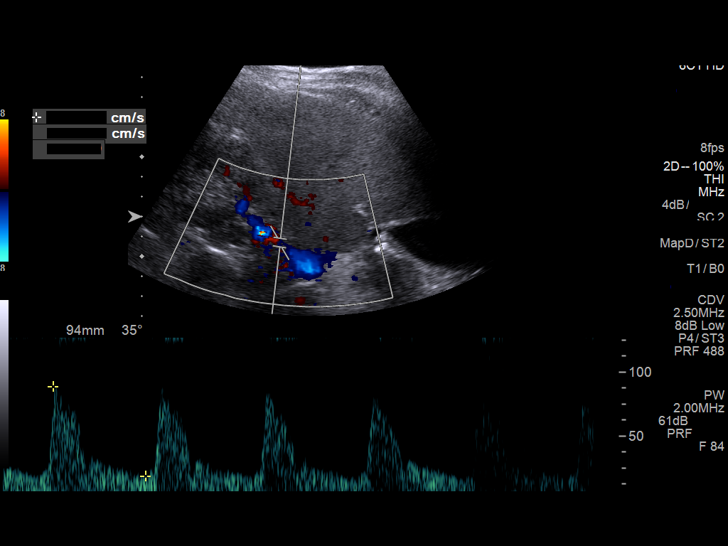
[im 50/99]
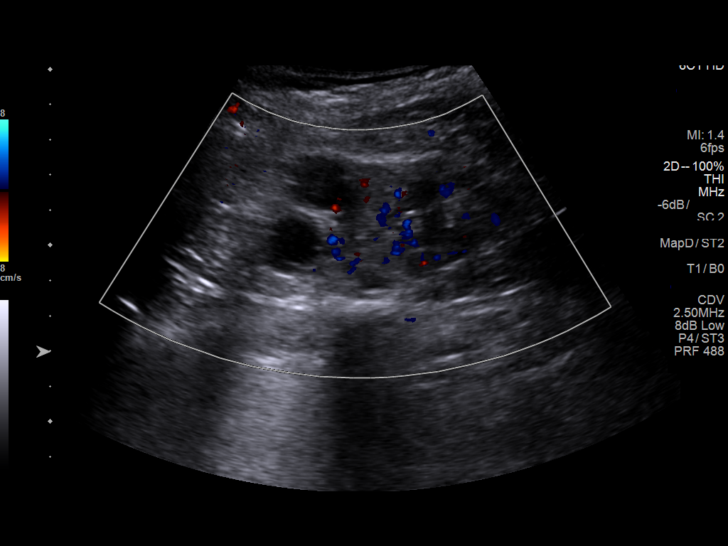
[im 58/99]
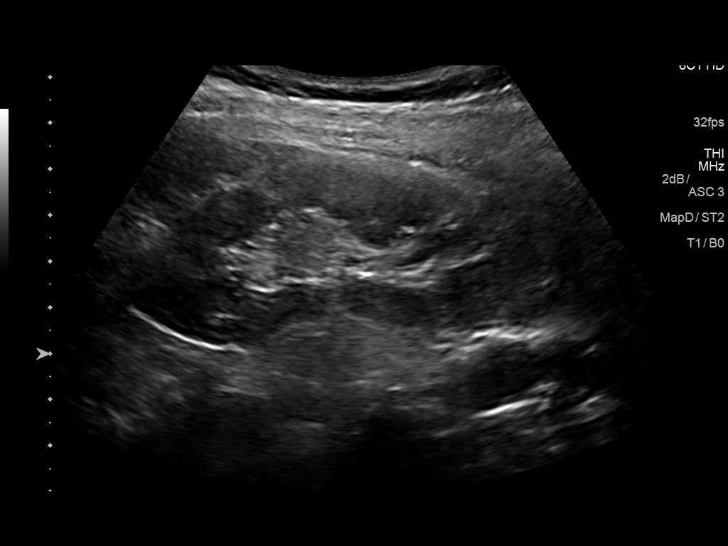
[im 66/99]
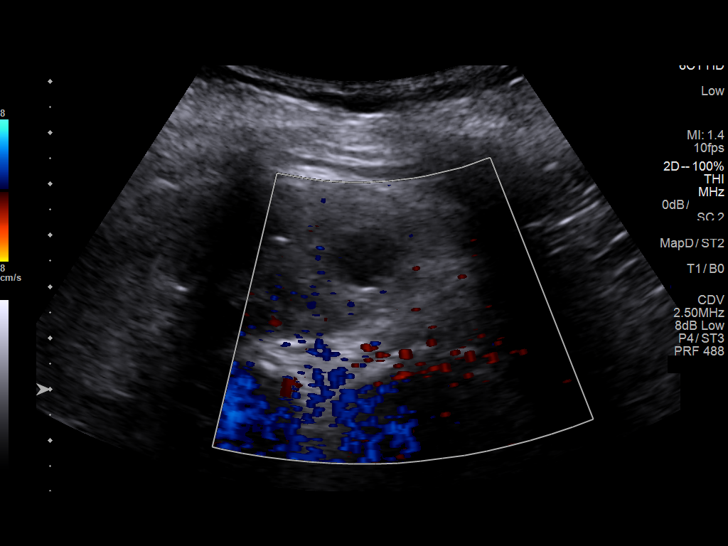
[im 74/99]
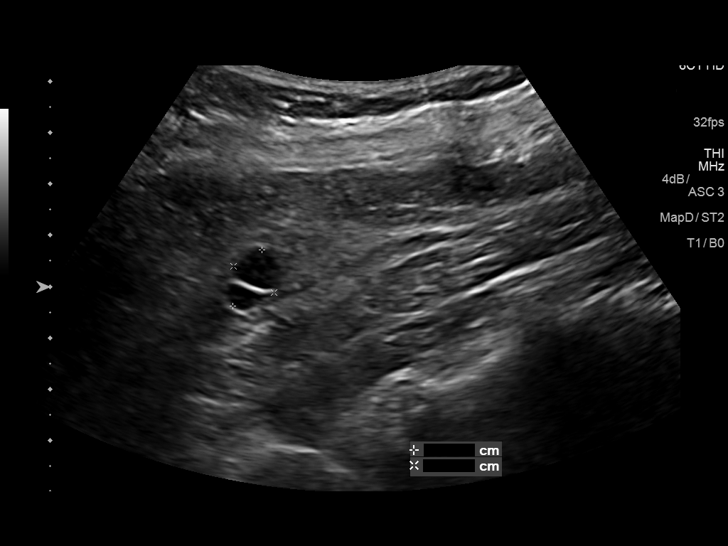
[im 82/99]
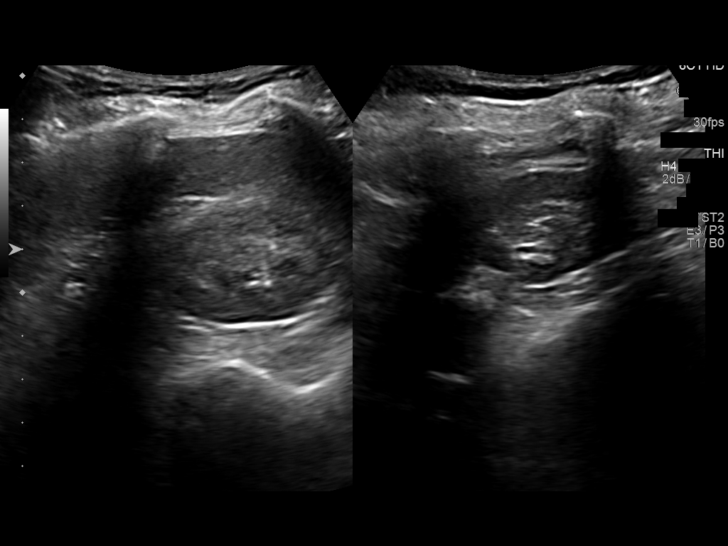
[im 90/99]
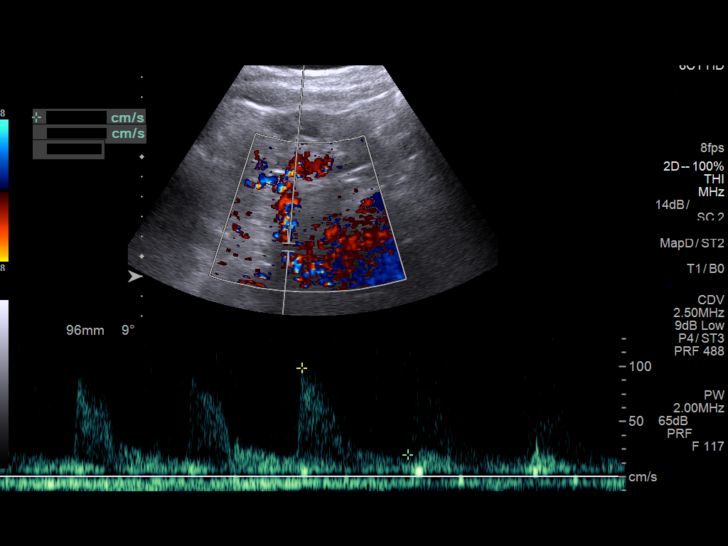
[im 99/99]
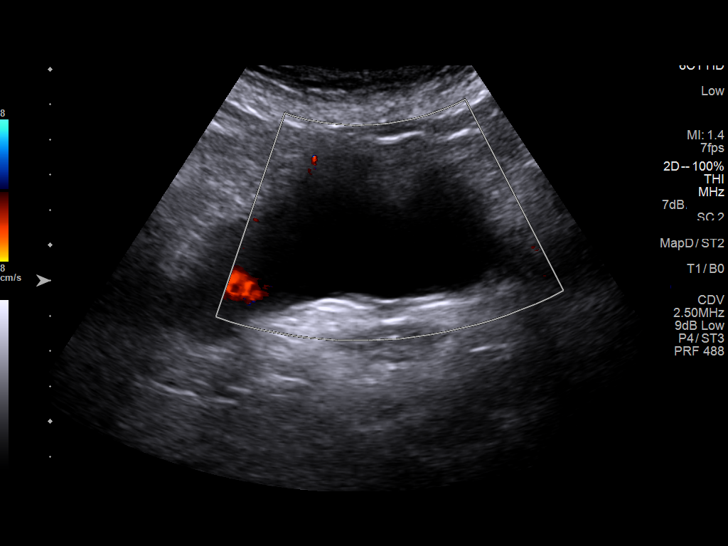

[13 of 25 positions shown; findings below may reference images not displayed]

FINDINGS: Right Kidney:

Length: 9.5 cm. Echogenic renal parenchyma again noted. Probable 4
mm stone in the interpolar right kidney is evidence by the presence
of an echogenic focus with posterior acoustic shadowing. Anechoic
cystic structure in the upper pole measures 2 x 2 cm in demonstrates
an imperceptible wall with posterior acoustic enhancement consistent
with a benign simple cyst. There is an adjacent 1.6 x 1.6 cm simple
cyst as well.

Left Kidney:

Length: 10.2 cm. Echogenic renal parenchyma again noted. Anechoic
cystic structures with imperceptible walls and posterior acoustic
enhancement are present in the lower pole measuring 2.2 x 1.7 x
cm and 1.2 x 0.9 x 1.5 cm.

Bladder:  Within normal limits

RENAL DUPLEX ULTRASOUND

Right Renal Artery Velocities:

Origin:  98 cm/sec

Mid:  104 cm/sec

Hilum:  83 cm/sec

Interlobar:  48 cm/sec

Arcuate:  20 cm/sec

Left Renal Artery Velocities:

Origin:  98 cm/sec

Mid:  76 cm/sec

Hilum:  96 cm/sec

Interlobar:  39 cm/sec

Arcuate:  29 cm/sec

Aortic Velocity:  41 cm/sec

Right Renal-Aortic Ratios:

Origin:

Mid:

Hilum:

Interlobar:

Arcuate:

Left Renal-Aortic Ratios:

Origin:

Mid:

Hilum:

Interlobar:

Arcuate:

Other: Heterogeneous atherosclerotic plaque is visualized in the
abdominal aorta. No evidence of aneurysm.
IMPRESSION: 1. Negative for hemodynamically significant renal artery stenosis.
2. Small 4 mm nonobstructing stone in the right renal collecting
system.
3. Bilateral simple renal cysts.
4. Echogenic renal parenchyma bilaterally consistent with underlying
medical renal disease.
5.  Aortic Atherosclerosis ([id number]-170.0).

## 2020-02-06 ENCOUNTER — Other Ambulatory Visit: Payer: Self-pay

## 2020-02-06 ENCOUNTER — Ambulatory Visit: Payer: Medicare HMO | Attending: Internal Medicine | Admitting: Licensed Clinical Social Worker

## 2020-02-06 DIAGNOSIS — F419 Anxiety disorder, unspecified: Secondary | ICD-10-CM

## 2020-02-06 NOTE — BH Specialist Note (Signed)
Integrated Behavioral Health Visit via Telemedicine (Telephone)  02/06/2020 [name] [id number]   Session Start time: 2:35 PM  Session End time: 2:50 PM Total time: 15  Referring Provider: Dr. Wynetta Emery Type of Visit: Telephonic Patient location: [facility] Provider location: Office All persons participating in visit: LCSW and Patient  Confirmed patient's address: Yes  Confirmed patient's phone number: Yes  Any changes to demographics: No   Confirmed patient's insurance: Yes  Any changes to patient's insurance: No   Discussed confidentiality: Yes    The following statements were read to the patient and/or legal guardian that are established with the [facility] Provider.  "The purpose of this phone visit is to provide behavioral health care while limiting exposure to the coronavirus (COVID19).  There is a possibility of technology failure and discussed alternative modes of communication if that failure occurs."  "By engaging in this telephone visit, you consent to the provision of healthcare.  Additionally, you authorize for your insurance to be billed for the services provided during this telephone visit."   Patient and/or legal guardian consented to telephone visit: Yes   PRESENTING CONCERNS: Patient and/or family reports the following symptoms/concerns: Pt reports hx of depression and anxiety. She participated medication management over 20 years ago. Currently, pt becomes anxious when having to drive. Duration of problem: Ongoing; Severity of problem: mild  STRENGTHS (Protective Factors/Coping Skills): Pt has good insight Pt successfully identified healthy coping skills Pt receives support from a friend  GOALS ADDRESSED: Patient will: 1.  Reduce symptoms of: anxiety  2.  Increase knowledge and/or ability of: coping skills and healthy habits  3.  Demonstrate ability to: Increase healthy adjustment to current life circumstances and Increase adequate support  systems for patient/family  INTERVENTIONS: Interventions utilized:  Mindfulness or Psychologist, educational, Supportive Counseling and Psychoeducation and/or Health Education Standardized Assessments completed: Not Needed  ASSESSMENT: Patient currently experiencing anxiety when having to drive.   Patient was approved for [facility] transportation; however, was unable to visit in-person to complete assessment. LCSW informed patient that the assessment can be conducted virtually and strongly encouraged her to contact [facility] for additional details. Pt successfully identified healthy coping skills to manage anxiety (utilizing a fidget ball) and receives strong support from a friend. Pt denied any concern for additional psychosocial stessors  PLAN: 1. Follow up with behavioral health clinician on : Contact LCSW with any behavioral health and/or resource needs 2. Behavioral recommendations: Utilize strategies discussed and follow up with [facility] 3. Referral(s): [place] (In Clinic)  Rebekah Chesterfield

## 2020-03-27 ENCOUNTER — Other Ambulatory Visit: Payer: Self-pay | Admitting: Internal Medicine

## 2020-03-30 ENCOUNTER — Telehealth: Payer: Medicare HMO | Admitting: Internal Medicine

## 2020-04-17 ENCOUNTER — Ambulatory Visit: Payer: Medicare HMO

## 2020-04-17 ENCOUNTER — Other Ambulatory Visit: Payer: Self-pay

## 2020-04-17 ENCOUNTER — Ambulatory Visit: Payer: Self-pay | Admitting: *Deleted

## 2020-04-17 NOTE — Telephone Encounter (Signed)
Per initial encounter  "Patient seeking clinical advice due a body rash that is spreading, located on her legs and arms, extremely irritated. Due to no availability patient transferred to [facility] and was advised about there mobile unit' ;contacted pt regarding her symptoms; the pt states she originally thought it was eczema and had been applying triamcinolone cream and vaseline to the area but the area remains dry and scaly; she also rates the area is itchy; recommendations made per nurse triage protocol; the pt is scheduled to see Dr Karle Plumber, [facility] on 04/24/20 at 1410; pt also given information about mobile clinic today at [place]; she verbalized understanding.   Reason for Disposition . SEVERE itching (i.e., interferes with sleep, normal activities or school)  Answer Assessment - Initial Assessment Questions 1. APPEARANCE of RASH: "Describe the rash." (e.g., spots, blisters, raised areas, skin peeling, scaly)     scaly 2. SIZE: "How big are the spots?" (e.g., tip of pen, eraser, coin; inches, centimeters)    Multiple Hot dog bun areas 3. LOCATION: "Where is the rash located?"     Arms and legs 4. COLOR: "What color is the rash?" (Note: It is difficult to assess rash color in people with darker-colored skin. When this situation occurs, simply ask the caller to describe what they see.)     Darker than pt's normal skin color; looks like alligator skin 5. ONSET: "When did the rash begin?"     03/17/20 6. FEVER: "Do you have a fever?" If Yes, ask: "What is your temperature, how was it measured, and when did it start?"     no 7. ITCHING: "Does the rash itch?" If Yes, ask: "How bad is the itch?" (Scale 1-10; or mild, moderate, severe)     Yes 8 out of 10 8. CAUSE: "What do you think is causing the rash?"     Thought was exzema 9. MEDICATION FACTORS: "Have you started any new medications within the last 2 weeks?" (e.g., antibiotics)       Started taking  hydralazine 50 mg around 02/2020 (per nephrology) 10. OTHER SYMPTOMS: "Do you have any other symptoms?" (e.g., dizziness, headache, sore throat, joint pain)       no 11. PREGNANCY: "Is there any chance you are pregnant?" "When was your last menstrual period?"       no  Protocols used: RASH OR REDNESS - [facility]

## 2020-04-19 ENCOUNTER — Other Ambulatory Visit: Payer: Self-pay

## 2020-04-19 ENCOUNTER — Ambulatory Visit: Payer: Medicare HMO | Admitting: Critical Care Medicine

## 2020-04-19 VITALS — BP 162/73 | HR 67 | Temp 98.0°F | Resp 18 | Ht 69.0 in | Wt 139.0 lb

## 2020-04-19 DIAGNOSIS — I872 Venous insufficiency (chronic) (peripheral): Secondary | ICD-10-CM | POA: Diagnosis not present

## 2020-04-19 DIAGNOSIS — L03116 Cellulitis of left lower limb: Secondary | ICD-10-CM

## 2020-04-19 DIAGNOSIS — Z23 Encounter for immunization: Secondary | ICD-10-CM

## 2020-04-19 MED ORDER — MOMETASONE FUROATE 0.1 % EX OINT: TOPICAL_OINTMENT | Freq: Every day | CUTANEOUS | 0 refills | Status: DC

## 2020-04-19 MED ORDER — DOXYCYCLINE HYCLATE 100 MG PO TABS: 100.0000 mg | ORAL_TABLET | Freq: Two times a day (BID) | ORAL | 0 refills | Status: DC

## 2020-04-19 NOTE — Progress Notes (Signed)
Patient complains of rash appearing on the arms and ankles 3 weeks ago. Patient has used Triamcinolone cream which the areas have dried out. Patient complains of constant itching.

## 2020-04-19 NOTE — Progress Notes (Signed)
Subjective:    Patient ID: Tina Waller, female    DOB: Apr 06, 1941, 79 y.o.   MRN: [id number]  04/19/2020 This is a pleasant 79 year old female primary care patient of Dr. Wynetta Emery seen today in the mobile health unit for rash in both lower extremities and left forearm.  She has been on topical triamcinolone for the lower extremity rash and left upper arm rash.  The rash in the left upper arm is improving but she has had increased pruritus swelling and pain in the lower extremities.  She has been previously diagnosed with chronic venous stasis dermatitis.  She has chronic edema lower extremities because of pulmonary hypertension and systemic hypertension.  Patient also notes increased difficulty with shortness of breath with ambulation.  On arrival blood pressure was 162/73.  No prior history of stroke with vertebral artery occlusion.  The patient has not had any falls      Past Medical History:  Diagnosis Date  . Anemia   . Anxiety   . Cataract   . Depression   . Diabetes mellitus without complication ([place])    pt denies  . GERD (gastroesophageal reflux disease)   . Gout   . Headache   . Hypertension   . Stroke [facility]) 2008   09/14/2018  . Tonsillar mass   . Type 2 diabetes mellitus ([place]) 09/14/2018  . Vertigo   . Wears dentures    upper  . Wears glasses      Family History  Problem Relation Age of Onset  . Heart disease Mother        CHF  . Osteoporosis Neg Hx   . Colon cancer Neg Hx   . Stomach cancer Neg Hx   . Pancreatic cancer Neg Hx      Social History   Socioeconomic History  . Marital status: Divorced    Spouse name: Not on file  . Number of children: Not on file  . Years of education: Not on file  . Highest education level: Not on file  Occupational History  . Not on file  Tobacco Use  . Smoking status: Former Smoker    Types: Cigarettes    Quit date: 07/14/2006    Years since quitting: 13.7  . Smokeless tobacco: Never Used  Vaping Use  . Vaping Use:  Never used  Substance and Sexual Activity  . Alcohol use: Yes    Alcohol/week: 4.0 standard drinks    Types: 4 Shots of liquor per week    Comment: 4 drinks a week  . Drug use: No  . Sexual activity: Not Currently  Other Topics Concern  . Not on file  Social History Narrative  . Not on file   Social Determinants of Health   Financial Resource Strain:   . Difficulty of Paying Living Expenses: Not on file  Food Insecurity:   . Worried About Charity fundraiser in the Last Year: Not on file  . Ran Out of Food in the Last Year: Not on file  Transportation Needs:   . Lack of Transportation (Medical): Not on file  . Lack of Transportation (Non-Medical): Not on file  Physical Activity:   . Days of Exercise per Week: Not on file  . Minutes of Exercise per Session: Not on file  Stress:   . Feeling of Stress : Not on file  Social Connections:   . Frequency of Communication with Friends and Family: Not on file  . Frequency of Social Gatherings with Friends  and Family: Not on file  . Attends Religious Services: Not on file  . Active Member of Clubs or Organizations: Not on file  . Attends Archivist Meetings: Not on file  . Marital Status: Not on file  Intimate Partner Violence:   . Fear of Current or Ex-Partner: Not on file  . Emotionally Abused: Not on file  . Physically Abused: Not on file  . Sexually Abused: Not on file     Allergies  Allergen Reactions  . Lisinopril Cough  . Lipitor [Atorvastatin] Diarrhea     Outpatient Medications Prior to Visit  Medication Sig Dispense Refill  . acetaminophen (TYLENOL) 500 MG tablet Take 500-1,000 mg by mouth every 6 (six) hours as needed for moderate pain or headache.    Marland Kitchen amLODipine (NORVASC) 5 MG tablet Take 1 tablet (5 mg total) by mouth daily. 90 tablet 0  . cholecalciferol (VITAMIN D3) 25 MCG (1000 UT) tablet Take 1,000 Units by mouth daily.    . clopidogrel (PLAVIX) 75 MG tablet TAKE 1 TABLET EVERY DAY 90 tablet 0  .  furosemide (LASIX) 40 MG tablet Take 1 tablet (40 mg total) by mouth 2 (two) times daily. 180 tablet 0  . losartan (COZAAR) 100 MG tablet Take 1 tablet (100 mg total) by mouth daily. 90 tablet 0  . meclizine (ANTIVERT) 25 MG tablet Take 1 tablet (25 mg total) by mouth 3 (three) times daily as needed for dizziness. 30 tablet 0  . metoprolol succinate (TOPROL-XL) 50 MG 24 hr tablet Take 2 tablets (100 mg total) by mouth daily. 180 tablet 0  . polyethylene glycol (MIRALAX) packet Take 17 g by mouth daily as needed for mild constipation.    . potassium chloride (KLOR-CON) 8 MEQ tablet TAKE 2 TABLETS EVERY DAY 180 tablet 3  . pravastatin (PRAVACHOL) 80 MG tablet Take 1 tablet (80 mg total) by mouth daily. For high cholesterol 90 tablet 0  . triamcinolone cream (KENALOG) 0.1 % Apply 1 application topically 2 (two) times daily as needed (rash).    . vitamin B-12 (CYANOCOBALAMIN) 1000 MCG tablet Take 1,000 mcg by mouth daily.     No facility-administered medications prior to visit.      Review of Systems  Constitutional: Positive for fatigue.  HENT: Negative.   Respiratory: Positive for shortness of breath.   Cardiovascular: Positive for leg swelling.  Genitourinary: Negative.   Skin: Positive for rash.  Neurological: Positive for weakness.  Hematological: Negative.   Psychiatric/Behavioral: The patient is nervous/anxious.        Objective:   Physical Exam Vitals:   04/19/20 1453  BP: (!) 162/73  Pulse: 67  Resp: 18  Temp: 98 F (36.7 C)  TempSrc: Oral  SpO2: 94%  Weight: 139 lb (63 kg)  Height: 5\' 9"  (1.753 m)    Gen: Pleasant, well-nourished, in no distress,  normal affect  ENT: No lesions,  mouth clear,  oropharynx clear, no postnasal drip   Neck: No JVD, no TMG, no carotid bruits  Lungs: No use of accessory muscles, no dullness to percussion, clear without rales or rhonchi  Cardiovascular: RRR, heart sounds normal, no murmur or gallops, 2+peripheral edema  Abdomen:  soft and NT, no HSM,  BS normal  Musculoskeletal: No deformities, no cyanosis or clubbing  Neuro: alert, non focal  Skin: Venous stasis dermatitis both lower extremities with mild edema.  There is evidence of cellulitis and is early formation in the left lower extremity  No results found.  Assessment & Plan:  I personally reviewed all images and lab data in the [facility] system as well as any outside material available during this office visit and agree with the  radiology impressions.   Cellulitis of left lower extremity Cellulitis of the left lower extremity will begin Vibramycin 1 tablet twice daily for 7 days  Stasis dermatitis of both legs Stasis dermatitis both lower extremities will begin Elocon cream apply topically daily bilaterally and associate this with a good skin moisturizer such as Aveeno or Cetaphil   Takeia was seen today for rash.  Diagnoses and all orders for this visit:  Need for immunization against influenza -     Flu Vaccine QUAD 36+ mos IM  Stasis dermatitis of both legs  Cellulitis of left lower extremity  Other orders -     mometasone (ELOCON) 0.1 % ointment; Apply topically daily. -     doxycycline (VIBRA-TABS) 100 MG tablet; Take 1 tablet (100 mg total) by mouth 2 (two) times daily.   Follow-up with primary care arranged  Flu vaccine given at this visit

## 2020-04-19 NOTE — Patient Instructions (Signed)
Start Elocon ointment apply to affected areas on lower legs daily  Apply Cetaphil or aveeno skin moisturizer to lower legs twice a day  Take doxycycline twice a day for 5 days for lower leg skin infection   No other medication changes  Keep upcoming appointment with Dr Wynetta Emery  See below for education on stasis dermatitis   Stasis Dermatitis Stasis dermatitis is a long-term (chronic) skin condition that happens when veins can no longer pump blood back to the heart (poor circulation). This condition causes a red or brown scaly rash or sores (ulcers) from the pooling of blood (stasis). This condition usually affects the lower legs. It may affect one leg or both legs. Without treatment, severe stasis dermatitis can lead to other skin conditions and infections. What are the causes? This condition is caused by poor circulation. What increases the risk? You are more likely to develop this condition if:  You are not very active.  You stand for long periods of time.  You have veins that have become enlarged and twisted (varicose veins).  You have leg veins that are not strong enough to send blood back to the heart (venous insufficiency).  You have had a blood clot.  You have been pregnant many times.  You have had vein surgery.  You are obese.  You have heart or kidney failure.  You are 52 years of age or older.  You have had injuries to your legs in the past. What are the signs or symptoms? Common early symptoms of this condition include:  Itchiness in one or both of your legs.  Swelling in your ankle or leg. This might get better overnight but be worse again during the day.  Skin that looks thin on your ankle and leg.  Red or brown marks that develop slowly.  Skin that is dry, cracked, or easily irritated.  Red, swollen skin that is sore or has a burning feeling.  An achy or heavy feeling after you walk or stand for long periods of time.  Pain. Later and more  severe symptoms of this condition include:  Skin that looks shiny.  Small, open sores (ulcers). These are often red or purple and leak fluid.  Skin that feels hard.  Severe itching.  A change in the shape or color of your lower legs.  Severe pain.  Difficulty walking. How is this diagnosed? This condition may be diagnosed based on:  Your symptoms and medical history.  A physical exam. You may also have tests, including:  Blood tests.  Imaging tests to check blood flow (Doppler ultrasound).  Allergy tests. You may need to see a health care provider who specializes in skin diseases (dermatologist). How is this treated? This condition may be treated with:  Compression stockings or an elastic wrap to improve circulation.  Medicines, such as: ? Corticosteroid creams and ointments. ? Non-corticosteroid medicines applied to the skin (topical). ? Medicine to reduce swelling in the legs (diuretics). ? Antibiotics. ? Medicine to relieve itching (antihistamines).  A bandage (dressing).  A wrap that contains zinc and gelatin (Unna boot). Follow these instructions at home: Skin care  Moisturize your skin as told by your health care provider. Do not use moisturizers with fragrance. This can irritate your skin.  Apply a cool, wet cloth (cool compress) to the affected areas.  Do not scratch your skin.  Do not rub your skin dry after a bath or shower. Gently pat your skin dry.  Do not use scented soaps,  detergents, or perfumes. Medicines  Take or use over-the-counter and prescription medicines only as told by your health care provider.  If you were prescribed an antibiotic medicine, take or use it as told by your health care provider. Do not stop taking or using the antibiotic even if your condition improves. Activity  Walk as told by your health care provider. Walking increases blood flow.  Do calf and ankle exercises throughout the day as told by your health care  provider. This will help increase blood flow.  Raise (elevate) your legs above the level of your heart when you are sitting or lying down. Lifestyle  Work with your health care provider to lose weight, if needed.  Do not cross your legs when you sit.  Do not stand or sit in one position for long periods of time.  Wear comfortable, loose-fitting clothing. Circulation in your legs will be worse if you wear tight pants, belts, and waistbands.  Do not use any products that contain nicotine or tobacco, such as cigarettes, e-cigarettes, and chewing tobacco. If you need help quitting, ask your health care provider. General instructions  If you were asked to use one of the following to help with your condition, follow instructions from your health care provider on how to: ? Remove and change any dressing. ? Wear compression stockings. These stockings help to prevent blood clots and reduce swelling in your legs. ? Wear the [employer].  Keep all follow-up visits as told by your health care provider. This is important. Contact a health care provider if:  Your condition does not improve with treatment.  Your condition gets worse.  You have signs of infection in the affected area. Watch for: ? Swelling. ? Tenderness. ? Redness. ? Soreness. ? Warmth.  You have a fever. Get help right away if:  You notice red streaks coming from the affected area.  Your bone or joint underneath the affected area becomes painful after the skin has healed.  The affected area turns darker.  You feel a deep pain in your leg or groin.  You are short of breath. Summary  Stasis dermatitis is a long-term (chronic) skin condition that happens when veins can no longer pump blood back to the heart (poor circulation).  Wear compression stockings as told by your health care provider. These stockings help to prevent blood clots and reduce swelling in your legs.  Follow instructions from your health care  provider about activity, medicines, and lifestyle.  Contact a health care provider if you have a fever or have signs of infection in the affected area.  Keep all follow-up visits as told by your health care provider. This is important. This information is not intended to replace advice given to you by your health care provider. Make sure you discuss any questions you have with your health care provider. Document Revised: 11/30/2017 Document Reviewed: 11/30/2017 Elsevier Patient Education  [place].

## 2020-04-20 DIAGNOSIS — L03116 Cellulitis of left lower limb: Secondary | ICD-10-CM | POA: Insufficient documentation

## 2020-04-20 NOTE — Assessment & Plan Note (Signed)
Cellulitis of the left lower extremity will begin Vibramycin 1 tablet twice daily for 7 days

## 2020-04-20 NOTE — Assessment & Plan Note (Signed)
Stasis dermatitis both lower extremities will begin Elocon cream apply topically daily bilaterally and associate this with a good skin moisturizer such as Aveeno or Cetaphil

## 2020-04-24 ENCOUNTER — Ambulatory Visit: Payer: Medicare HMO | Admitting: Internal Medicine

## 2020-04-30 ENCOUNTER — Other Ambulatory Visit: Payer: Self-pay | Admitting: Internal Medicine

## 2020-04-30 DIAGNOSIS — I10 Essential (primary) hypertension: Secondary | ICD-10-CM

## 2020-04-30 MED ORDER — METOPROLOL SUCCINATE ER 50 MG PO TB24: 100.0000 mg | ORAL_TABLET | Freq: Every day | ORAL | 0 refills | Status: DC

## 2020-04-30 NOTE — Telephone Encounter (Signed)
Medication Refill - Medication: metoprolol succinate (TOPROL-XL) 50 MG 24 hr tablet    Has the patient contacted their pharmacy? Yes.   (Agent: If no, request that the patient contact the pharmacy for the refill.) (Agent: If yes, when and what did the pharmacy advise?)  Preferred Pharmacy (with phone number or street name):  [facility], [address]  [place] [address]  Phone: [phone] Fax: [phone]     Agent: Please be advised that RX refills may take up to 3 business days. We ask that you follow-up with your pharmacy.

## 2020-05-02 DIAGNOSIS — N183 Chronic kidney disease, stage 3 unspecified: Secondary | ICD-10-CM | POA: Diagnosis not present

## 2020-05-07 DIAGNOSIS — E213 Hyperparathyroidism, unspecified: Secondary | ICD-10-CM | POA: Diagnosis not present

## 2020-05-07 DIAGNOSIS — N1832 Chronic kidney disease, stage 3b: Secondary | ICD-10-CM | POA: Diagnosis not present

## 2020-05-07 DIAGNOSIS — I129 Hypertensive chronic kidney disease with stage 1 through stage 4 chronic kidney disease, or unspecified chronic kidney disease: Secondary | ICD-10-CM | POA: Diagnosis not present

## 2020-05-07 DIAGNOSIS — R809 Proteinuria, unspecified: Secondary | ICD-10-CM | POA: Diagnosis not present

## 2020-05-17 ENCOUNTER — Telehealth: Payer: Self-pay | Admitting: Internal Medicine

## 2020-05-17 NOTE — Telephone Encounter (Signed)
Only A1C on file is from 09/2018, printed and faxed to [facility] as requested

## 2020-05-17 NOTE — Telephone Encounter (Signed)
Copied from [place] [phone]. Topic: General - Other >> May 17, 2020 10:01 AM Antonieta Iba C wrote: Reason for CRM: Mechele Claude with [facility]. Is calling in to request the pt's A1C from the last 2years. Pt is being seen by Dr. Harrie Jeans.    Phone : [phone]  Fax: [phone]

## 2020-06-06 ENCOUNTER — Encounter: Payer: Self-pay | Admitting: Internal Medicine

## 2020-06-06 NOTE — Progress Notes (Signed)
I received lab results from her nephrologist Dr. Harrie Jeans.  Labs collected 05/03/2020. Intact PTH 94 Creatinine 1.27/GFR 47/albumin 4.1

## 2020-06-26 ENCOUNTER — Other Ambulatory Visit: Payer: Self-pay | Admitting: Internal Medicine

## 2020-06-26 DIAGNOSIS — I10 Essential (primary) hypertension: Secondary | ICD-10-CM

## 2020-07-07 ENCOUNTER — Other Ambulatory Visit: Payer: Self-pay | Admitting: Internal Medicine

## 2020-07-07 DIAGNOSIS — I10 Essential (primary) hypertension: Secondary | ICD-10-CM

## 2020-07-09 ENCOUNTER — Other Ambulatory Visit: Payer: Self-pay | Admitting: Family

## 2020-07-09 DIAGNOSIS — E7800 Pure hypercholesterolemia, unspecified: Secondary | ICD-10-CM

## 2020-07-09 DIAGNOSIS — Z8673 Personal history of transient ischemic attack (TIA), and cerebral infarction without residual deficits: Secondary | ICD-10-CM

## 2020-07-09 DIAGNOSIS — I10 Essential (primary) hypertension: Secondary | ICD-10-CM

## 2020-07-11 DIAGNOSIS — H524 Presbyopia: Secondary | ICD-10-CM | POA: Diagnosis not present

## 2020-07-26 ENCOUNTER — Encounter: Payer: Self-pay | Admitting: Physician Assistant

## 2020-07-26 ENCOUNTER — Ambulatory Visit: Payer: Medicare HMO | Attending: Physician Assistant | Admitting: Physician Assistant

## 2020-07-26 ENCOUNTER — Other Ambulatory Visit: Payer: Self-pay

## 2020-07-26 VITALS — BP 181/90 | HR 71 | Temp 98.9°F | Ht 69.0 in | Wt 141.0 lb

## 2020-07-26 DIAGNOSIS — E876 Hypokalemia: Secondary | ICD-10-CM | POA: Diagnosis not present

## 2020-07-26 DIAGNOSIS — Z8673 Personal history of transient ischemic attack (TIA), and cerebral infarction without residual deficits: Secondary | ICD-10-CM

## 2020-07-26 DIAGNOSIS — E7800 Pure hypercholesterolemia, unspecified: Secondary | ICD-10-CM | POA: Diagnosis not present

## 2020-07-26 DIAGNOSIS — R159 Full incontinence of feces: Secondary | ICD-10-CM | POA: Diagnosis not present

## 2020-07-26 DIAGNOSIS — R8281 Pyuria: Secondary | ICD-10-CM | POA: Diagnosis not present

## 2020-07-26 DIAGNOSIS — I10 Essential (primary) hypertension: Secondary | ICD-10-CM

## 2020-07-26 DIAGNOSIS — N1832 Chronic kidney disease, stage 3b: Secondary | ICD-10-CM | POA: Diagnosis not present

## 2020-07-26 LAB — POCT URINALYSIS DIP (CLINITEK)
Bilirubin, UA: NEGATIVE
Glucose, UA: NEGATIVE mg/dL
Ketones, POC UA: NEGATIVE mg/dL
Nitrite, UA: NEGATIVE
Spec Grav, UA: 1.015 (ref 1.010–1.025)
Urobilinogen, UA: 0.2 E.U./dL
pH, UA: 5.5 (ref 5.0–8.0)

## 2020-07-26 MED ORDER — SULFAMETHOXAZOLE-TRIMETHOPRIM 800-160 MG PO TABS: 1.0000 | ORAL_TABLET | Freq: Two times a day (BID) | ORAL | 0 refills | Status: DC

## 2020-07-26 MED ORDER — MOMETASONE FUROATE 0.1 % EX OINT
TOPICAL_OINTMENT | Freq: Every day | CUTANEOUS | 0 refills | Status: AC
Start: 2020-07-26 — End: ?

## 2020-07-26 MED ORDER — FUROSEMIDE 40 MG PO TABS: 40.0000 mg | ORAL_TABLET | Freq: Two times a day (BID) | ORAL | 1 refills | Status: DC

## 2020-07-26 MED ORDER — PRAVASTATIN SODIUM 80 MG PO TABS: 80.0000 mg | ORAL_TABLET | Freq: Every day | ORAL | 1 refills | Status: DC

## 2020-07-26 MED ORDER — LOSARTAN POTASSIUM 100 MG PO TABS: 100.0000 mg | ORAL_TABLET | Freq: Every day | ORAL | 1 refills | Status: DC

## 2020-07-26 MED ORDER — CLOPIDOGREL BISULFATE 75 MG PO TABS: 75.0000 mg | ORAL_TABLET | Freq: Every day | ORAL | 1 refills | Status: DC

## 2020-07-26 MED ORDER — POTASSIUM CHLORIDE ER 8 MEQ PO TBCR
16.0000 meq | EXTENDED_RELEASE_TABLET | Freq: Every day | ORAL | 3 refills | Status: DC
Start: 2020-07-26 — End: 2020-08-14

## 2020-07-26 MED ORDER — AMLODIPINE BESYLATE 10 MG PO TABS: 10.0000 mg | ORAL_TABLET | Freq: Every day | ORAL | 3 refills | Status: DC

## 2020-07-26 NOTE — Patient Instructions (Signed)
Check blood pressures daily and record.  I have increased your dose of amlodipine from 5 to 10mg .    Probiotics for intestinal health.

## 2020-07-26 NOTE — Progress Notes (Signed)
Patient ID: Tina Waller, female   DOB: [birth year], 80 y.o.   MRN: [id number]   Tina Waller, is a 80 y.o. female  VQQ:[id number]  [id number]  DOB - [birth year]  Subjective:  Chief Complaint and HPI: Tina Waller is a 80 y.o. female here today for med RF and 2 week h/o fecal incontinence.  She is wearing generic depends.  No changes in diet.  She doesn't feel the sensation to have a BM.  She is just noticing that she smells a bad odor then noticed she has leaked BM into her undergarments.  No change in appetite.  No N/V/D/C.  No abdominal pain.  No other new neurological deficits and no urinary s/sx.    BP at home has been ~150/90-95  ROS:   Constitutional:  No f/c, No night sweats, No unexplained weight loss. EENT:  No vision changes, No blurry vision, No hearing changes. No mouth, throat, or ear problems.  Respiratory: No cough, No SOB Cardiac: No CP, no palpitations GI:  No abd pain, No N/V/D.  See above GU: No Urinary s/sx Musculoskeletal: No joint pain Neuro: No headache, no dizziness, no motor weakness.  Skin: No rash Endocrine:  No polydipsia. No polyuria.  Psych: Denies SI/HI  No problems updated.  ALLERGIES: Allergies  Allergen Reactions  . Lisinopril Cough  . Lipitor [Atorvastatin] Diarrhea    PAST MEDICAL HISTORY: Past Medical History:  Diagnosis Date  . Anemia   . Anxiety   . Cataract   . Depression   . Diabetes mellitus without complication ([place])    pt denies  . GERD (gastroesophageal reflux disease)   . Gout   . Headache   . Hypertension   . Stroke [facility]) 2008   09/14/2018  . Tonsillar mass   . Type 2 diabetes mellitus ([place]) 09/14/2018  . Vertigo   . Wears dentures    upper  . Wears glasses     MEDICATIONS AT HOME: Prior to Admission medications   Medication Sig Start Date End Date Taking? Authorizing Provider  acetaminophen (TYLENOL) 500 MG tablet Take 500-1,000 mg by mouth every 6 (six) hours as needed for moderate pain or  headache.   Yes [provider]  amLODipine (NORVASC) 10 MG tablet Take 1 tablet (10 mg total) by mouth daily. 07/26/20  Yes Argentina Donovan, PA-C  cholecalciferol (VITAMIN D3) 25 MCG (1000 UT) tablet Take 1,000 Units by mouth daily.   Yes [provider]  meclizine (ANTIVERT) 25 MG tablet Take 1 tablet (25 mg total) by mouth 3 (three) times daily as needed for dizziness. 09/27/18  Yes Isla Pence, MD  polyethylene glycol [facility]) packet Take 17 g by mouth daily as needed for mild constipation. 09/15/18  Yes Hongalgi, Lenis Dickinson, MD  sulfamethoxazole-trimethoprim (BACTRIM DS) 800-160 MG tablet Take 1 tablet by mouth 2 (two) times daily. 07/26/20  Yes Argentina Donovan, PA-C  triamcinolone cream (KENALOG) 0.1 % Apply 1 application topically 2 (two) times daily as needed (rash). 09/15/18  Yes Hongalgi, Lenis Dickinson, MD  vitamin B-12 (CYANOCOBALAMIN) 1000 MCG tablet Take 1,000 mcg by mouth daily.   Yes [provider]  clopidogrel (PLAVIX) 75 MG tablet Take 1 tablet (75 mg total) by mouth daily. 07/26/20   Argentina Donovan, PA-C  furosemide (LASIX) 40 MG tablet Take 1 tablet (40 mg total) by mouth 2 (two) times daily. 07/26/20   Argentina Donovan, PA-C  losartan (COZAAR) 100 MG tablet Take 1 tablet (100 mg total) by mouth  daily. 07/26/20   Argentina Donovan, PA-C  metoprolol succinate (TOPROL-XL) 50 MG 24 hr tablet TAKE 2 TABLETS EVERY DAY 07/08/20   [name], MD  mometasone (ELOCON) 0.1 % ointment Apply topically daily. 07/26/20   Argentina Donovan, PA-C  potassium chloride (KLOR-CON) 8 MEQ tablet Take 2 tablets (16 mEq total) by mouth daily. 07/26/20   Argentina Donovan, PA-C  pravastatin (PRAVACHOL) 80 MG tablet Take 1 tablet (80 mg total) by mouth daily. For high cholesterol 07/26/20   Argentina Donovan, PA-C     Objective:  EXAM:   Vitals:   07/26/20 1442  BP: (!) 181/90  Pulse: 71  Temp: 98.9 F (37.2 C)  TempSrc: Oral  SpO2: 96%  Weight: 141 lb (64 kg)   Height: 5\' 9"  (1.753 m)    General appearance : A&OX3. NAD. Non-toxic-appearing HEENT: Atraumatic and Normocephalic.  PERRLA. EOM intact.  Chest/Lungs:  Breathing-non-labored, Good air entry bilaterally, breath sounds normal without rales, rhonchi, or wheezing  CVS: S1 S2 regular, no murmurs, gallops, rubs  Extremities: Bilateral Lower Ext shows no edema, both legs are warm to touch with = pulse throughout Neurology:  CN II-XII grossly intact, Non focal.   Psych:  TP linear. J/I WNL. Normal speech. Appropriate eye contact and affect.  Skin:  No Rash  Data Review Lab Results  Component Value Date   HGBA1C 5.6 09/14/2018   HGBA1C 5.0 04/21/2016     Assessment & Plan   1. Essential hypertension Uncontrolled-increase amlodipine from 5 to 10 mg and check BP daily and record and keep log - Comprehensive metabolic panel - CBC with Differential/Platelet - furosemide (LASIX) 40 MG tablet; Take 1 tablet (40 mg total) by mouth 2 (two) times daily.  Dispense: 180 tablet; Refill: 1 - losartan (COZAAR) 100 MG tablet; Take 1 tablet (100 mg total) by mouth daily.  Dispense: 90 tablet; Refill: 1 - amLODipine (NORVASC) 10 MG tablet; Take 1 tablet (10 mg total) by mouth daily.  Dispense: 90 tablet; Refill: 3  2. Stage 3b chronic kidney disease (HCC) - Comprehensive metabolic panel - CBC with Differential/Platelet - potassium chloride (KLOR-CON) 8 MEQ tablet; Take 2 tablets (16 mEq total) by mouth daily.  Dispense: 180 tablet; Refill: 3  3. Pure hypercholesterolemia - Lipid panel - pravastatin (PRAVACHOL) 80 MG tablet; Take 1 tablet (80 mg total) by mouth daily. For high cholesterol  Dispense: 90 tablet; Refill: 1  4. History of CVA (cerebrovascular accident) - Lipid panel - clopidogrel (PLAVIX) 75 MG tablet; Take 1 tablet (75 mg total) by mouth daily.  Dispense: 90 tablet; Refill: 1 - pravastatin (PRAVACHOL) 80 MG tablet; Take 1 tablet (80 mg total) by mouth daily. For high cholesterol   Dispense: 90 tablet; Refill: 1 - Ambulatory referral to Neurology  5. Hypokalemia - losartan (COZAAR) 100 MG tablet; Take 1 tablet (100 mg total) by mouth daily.  Dispense: 90 tablet; Refill: 1  6. Incontinence of feces, unspecified fecal incontinence type probiotics - Ambulatory referral to Gastroenterology and neurology - POCT URINALYSIS DIP (CLINITEK) - Ambulatory referral to Neurology  7. Pyuria Likely due to #6 - Urine Culture - sulfamethoxazole-trimethoprim (BACTRIM DS) 800-160 MG tablet; Take 1 tablet by mouth 2 (two) times daily.  Dispense: 14 tablet; Refill: 0   Patient have been counseled extensively about nutrition and exercise  Return for 3-4 weeks with Lurena Joiner for f/up blood pressure and 4 months with PCP.  The patient was given clear instructions to go to ER or  return to medical center if symptoms don't improve, worsen or new problems develop. The patient verbalized understanding. The patient was told to call to get lab results if they haven't heard anything in the next week.     Freeman Caldron, PA-C [facility] [place], [place]   07/26/2020, 3:04 PM

## 2020-07-27 LAB — LIPID PANEL
Chol/HDL Ratio: 3.1 ratio (ref 0.0–4.4)
Cholesterol, Total: 166 mg/dL (ref 100–199)
HDL: 53 mg/dL (ref >39)
LDL Chol Calc (NIH): 91 mg/dL (ref 0–99)
Triglycerides: 122 mg/dL (ref 0–149)
VLDL Cholesterol Cal: 22 mg/dL (ref 5–40)

## 2020-07-27 LAB — COMPREHENSIVE METABOLIC PANEL
ALT: 8 IU/L (ref 0–32)
AST: 15 IU/L (ref 0–40)
Albumin/Globulin Ratio: 1.5 (ref 1.2–2.2)
Albumin: 4.3 g/dL (ref 3.7–4.7)
Alkaline Phosphatase: 134 IU/L — ABNORMAL HIGH (ref 44–121)
BUN/Creatinine Ratio: 10 — ABNORMAL LOW (ref 12–28)
BUN: 15 mg/dL (ref 8–27)
Bilirubin Total: 0.5 mg/dL (ref 0.0–1.2)
CO2: 25 mmol/L (ref 20–29)
Calcium: 9.4 mg/dL (ref 8.7–10.3)
Chloride: 103 mmol/L (ref 96–106)
Creatinine, Ser: 1.43 mg/dL — ABNORMAL HIGH (ref 0.57–1.00)
GFR calc Af Amer: 41 mL/min/{1.73_m2} — ABNORMAL LOW (ref >59)
GFR calc non Af Amer: 35 mL/min/{1.73_m2} — ABNORMAL LOW (ref >59)
Globulin, Total: 2.9 g/dL (ref 1.5–4.5)
Glucose: 94 mg/dL (ref 65–99)
Potassium: 4.2 mmol/L (ref 3.5–5.2)
Sodium: 139 mmol/L (ref 134–144)
Total Protein: 7.2 g/dL (ref 6.0–8.5)

## 2020-07-27 LAB — CBC WITH DIFFERENTIAL/PLATELET
Basophils Absolute: 0 10*3/uL (ref 0.0–0.2)
Basos: 1 %
EOS (ABSOLUTE): 0.2 10*3/uL (ref 0.0–0.4)
Eos: 3 %
Hematocrit: 37.4 % (ref 34.0–46.6)
Hemoglobin: 13.8 g/dL (ref 11.1–15.9)
Immature Grans (Abs): 0 10*3/uL (ref 0.0–0.1)
Immature Granulocytes: 0 %
Lymphocytes Absolute: 2 10*3/uL (ref 0.7–3.1)
Lymphs: 36 %
MCH: 33.3 pg — ABNORMAL HIGH (ref 26.6–33.0)
MCHC: 36.9 g/dL — ABNORMAL HIGH (ref 31.5–35.7)
MCV: 90 fL (ref 79–97)
Monocytes Absolute: 0.7 10*3/uL (ref 0.1–0.9)
Monocytes: 12 %
Neutrophils Absolute: 2.8 10*3/uL (ref 1.4–7.0)
Neutrophils: 48 %
Platelets: 291 10*3/uL (ref 150–450)
RBC: 4.14 x10E6/uL (ref 3.77–5.28)
RDW: 11.6 % — ABNORMAL LOW (ref 11.7–15.4)
WBC: 5.7 10*3/uL (ref 3.4–10.8)

## 2020-08-02 ENCOUNTER — Other Ambulatory Visit: Payer: Self-pay | Admitting: Family

## 2020-08-02 ENCOUNTER — Other Ambulatory Visit: Payer: Self-pay | Admitting: Internal Medicine

## 2020-08-02 DIAGNOSIS — E7800 Pure hypercholesterolemia, unspecified: Secondary | ICD-10-CM

## 2020-08-02 DIAGNOSIS — Z8673 Personal history of transient ischemic attack (TIA), and cerebral infarction without residual deficits: Secondary | ICD-10-CM

## 2020-08-02 DIAGNOSIS — I10 Essential (primary) hypertension: Secondary | ICD-10-CM

## 2020-08-02 DIAGNOSIS — E876 Hypokalemia: Secondary | ICD-10-CM

## 2020-08-09 ENCOUNTER — Telehealth: Payer: Self-pay | Admitting: Internal Medicine

## 2020-08-09 MED ORDER — METOPROLOL SUCCINATE ER 50 MG PO TB24: 100.0000 mg | ORAL_TABLET | Freq: Every day | ORAL | 0 refills | Status: DC

## 2020-08-09 NOTE — Telephone Encounter (Signed)
Will forward to pcp

## 2020-08-09 NOTE — Telephone Encounter (Signed)
Pt called in for a supply for metoprolol succinate (TOPROL-XL) 50 MG 24 hr tablet. Pt says that she is still waiting for her Rx from [facility]. Pt would like to know if provider could send in a supply until she receive her order from Mail order? Pt says that she has been without her medication for 2 days waiting.     Pharmacy:  [facility] [place] Phone:  [phone]  Fax:  [phone]

## 2020-08-10 NOTE — Telephone Encounter (Signed)
Contacted pt and left a detailed vm that pcp has sent a rx to [facility] for pickup for Metoprolol and if she has any questions or concerns to give [place] a call

## 2020-08-14 ENCOUNTER — Telehealth: Payer: Self-pay

## 2020-08-14 ENCOUNTER — Encounter: Payer: Self-pay | Admitting: Physician Assistant

## 2020-08-14 ENCOUNTER — Ambulatory Visit: Payer: Medicare HMO | Admitting: Physician Assistant

## 2020-08-14 ENCOUNTER — Other Ambulatory Visit: Payer: Self-pay

## 2020-08-14 VITALS — BP 136/60 | HR 100 | Ht 69.0 in | Wt 140.2 lb

## 2020-08-14 DIAGNOSIS — K921 Melena: Secondary | ICD-10-CM | POA: Diagnosis not present

## 2020-08-14 DIAGNOSIS — R159 Full incontinence of feces: Secondary | ICD-10-CM | POA: Diagnosis not present

## 2020-08-14 DIAGNOSIS — R194 Change in bowel habit: Secondary | ICD-10-CM

## 2020-08-14 MED ORDER — METOCLOPRAMIDE HCL 10 MG PO TABS: 10.0000 mg | ORAL_TABLET | Freq: Two times a day (BID) | ORAL | 0 refills | Status: DC

## 2020-08-14 NOTE — Telephone Encounter (Signed)
   Tina Waller 01-23-1941 [id number]  Dear Dr. Wynetta Emery:  We have scheduled the above named patient for a(n) Colonoscopy procedure. Our records show that (s)he is on anticoagulation therapy.  Please advise as to whether the patient may come off their therapy of Plavixs 5 days prior to their procedure which is scheduled for 08/27/20.  Please route your response to [facility] or fax response to [phone].  Sincerely,    [facility]

## 2020-08-14 NOTE — Progress Notes (Signed)
Chief Complaint: Fecal incontinence  HPI:    Tina Waller is a 80 year old African-American female with a past medical history as listed below including CVA on Plavix, known to Dr. Loletha Carrow, who was referred to me by [name], MD for a complaint of fecal incontinence.    11/03/2018 patient seen in clinic by Dr. Loletha Carrow for hematochezia and diarrhea.  At that time as discussed she had had a screening colonoscopy in March 2018 with poor preparation and extensive lavage performed to improve visualization, tortuous and redundant colon channeling scope passage and a diminutive adenomatous polyp removed.  Gust chronic diarrhea for several months.  That time check TSH, ova and parasites and C. difficile and told to use Imodium 2 tablets every morning.    07/26/2020 patient seen by PCP and described a 2-week history of fecal incontinence.    Today, the patient describes that for the past month she has had trouble with fecal incontinence off-and-on describing multiple episodes which seem to have increased over the past couple of weeks.  These are typically full bowel movements, some are loose and some more solid, has also been seeing some bright red blood and mucus in the stool.  Apparently was on antibiotics recently and her incontinent episodes cleared up but as soon as she stopped the antibiotics is returned.  Tells me that otherwise she has not had any change in her bowel habits.  Eating yogurt seems to make this worse.  Describes that she does "feel like after the bowel movement", she just smells a bad smell.  She is wearing depends.    Denies fever, chills, abdominal pain or weight loss.  Past Medical History:  Diagnosis Date  . Anemia   . Anxiety   . Cataract   . Depression   . Diabetes mellitus without complication ([place])    pt denies  . GERD (gastroesophageal reflux disease)   . Gout   . Headache   . Hypertension   . Stroke [facility]) 2008   09/14/2018  . Tonsillar mass   . Type 2 diabetes  mellitus ([place]) 09/14/2018  . Vertigo   . Wears dentures    upper  . Wears glasses     Past Surgical History:  Procedure Laterality Date  . ABDOMINAL HYSTERECTOMY    . BREAST SURGERY     lumpectomy  . BUNIONECTOMY    . CATARACT EXTRACTION Left   . COLONOSCOPY W/ BIOPSIES AND POLYPECTOMY    . MULTIPLE TOOTH EXTRACTIONS    . TONGUE BIOPSY Right 12/13/2018   Procedure: RIGHT TONSIL BIOPSY;  Surgeon: Melida Quitter, MD;  Location: [facility];  Service: ENT;  Laterality: Right;    Current Outpatient Medications  Medication Sig Dispense Refill  . acetaminophen (TYLENOL) 500 MG tablet Take 500-1,000 mg by mouth every 6 (six) hours as needed for moderate pain or headache.    Marland Kitchen amLODipine (NORVASC) 10 MG tablet Take 1 tablet (10 mg total) by mouth daily. 90 tablet 3  . cholecalciferol (VITAMIN D3) 25 MCG (1000 UT) tablet Take 1,000 Units by mouth daily.    . clopidogrel (PLAVIX) 75 MG tablet Take 1 tablet (75 mg total) by mouth daily. 90 tablet 1  . furosemide (LASIX) 40 MG tablet Take 1 tablet (40 mg total) by mouth 2 (two) times daily. 180 tablet 1  . losartan (COZAAR) 100 MG tablet Take 1 tablet (100 mg total) by mouth daily. 90 tablet 1  . metoprolol succinate (TOPROL XL) 50 MG 24 hr tablet  Take 2 tablets (100 mg total) by mouth daily. Take with or immediately following a meal. 60 tablet 0  . mometasone (ELOCON) 0.1 % ointment Apply topically daily. 45 g 0  . polyethylene glycol (MIRALAX) packet Take 17 g by mouth daily as needed for mild constipation.    . potassium chloride (KLOR-CON) 8 MEQ tablet Take 2 tablets (16 mEq total) by mouth daily. 180 tablet 3  . pravastatin (PRAVACHOL) 80 MG tablet Take 1 tablet (80 mg total) by mouth daily. For high cholesterol 90 tablet 1  . sulfamethoxazole-trimethoprim (BACTRIM DS) 800-160 MG tablet Take 1 tablet by mouth 2 (two) times daily. 14 tablet 0  . triamcinolone cream (KENALOG) 0.1 % Apply 1 application topically 2 (two) times daily as needed (rash).     . vitamin B-12 (CYANOCOBALAMIN) 1000 MCG tablet Take 1,000 mcg by mouth daily.     No current facility-administered medications for this visit.    Allergies as of 08/14/2020 - Review Complete 08/14/2020  Allergen Reaction Noted  . Lisinopril Cough 05/20/2018  . Lipitor [atorvastatin] Diarrhea 12/22/2016    Family History  Problem Relation Age of Onset  . Heart disease Mother        CHF  . Osteoporosis Neg Hx   . Colon cancer Neg Hx   . Stomach cancer Neg Hx   . Pancreatic cancer Neg Hx     Social History   Socioeconomic History  . Marital status: Divorced    Spouse name: Not on file  . Number of children: Not on file  . Years of education: Not on file  . Highest education level: Not on file  Occupational History  . Not on file  Tobacco Use  . Smoking status: Former Smoker    Types: Cigarettes    Quit date: 07/14/2006    Years since quitting: 14.0  . Smokeless tobacco: Never Used  Vaping Use  . Vaping Use: Never used  Substance and Sexual Activity  . Alcohol use: Yes    Alcohol/week: 4.0 standard drinks    Types: 4 Shots of liquor per week    Comment: 4 drinks a week  . Drug use: No  . Sexual activity: Not Currently  Other Topics Concern  . Not on file  Social History Narrative  . Not on file   Social Determinants of Health   Financial Resource Strain: Not on file  Food Insecurity: Not on file  Transportation Needs: Not on file  Physical Activity: Not on file  Stress: Not on file  Social Connections: Not on file  Intimate Partner Violence: Not on file    Review of Systems:    Constitutional: No weight loss, fever or chills Cardiovascular: No chest pain, chest pressure or palpitations   Respiratory: No SOB or cough Gastrointestinal: See HPI and otherwise negative   Physical Exam:  Vital signs: BP 136/60   Pulse 100   Ht 5\' 9"  (1.753 m)   Wt 140 lb 3.2 oz (63.6 kg)   BMI 20.70 kg/m   Constitutional:   Pleasant Elderly AA female appears to be in  NAD, Well developed, Well nourished, alert and cooperative Respiratory: Respirations even and unlabored. Lungs clear to auscultation bilaterally.   No wheezes, crackles, or rhonchi.  Cardiovascular: Normal S1, S2. No MRG. Regular rate and rhythm. No peripheral edema, cyanosis or pallor.  Gastrointestinal:  Soft, nondistended, nontender. No rebound or guarding. Normal bowel sounds. No appreciable masses or hepatomegaly. Rectal:  Not performed.  Psychiatric: Demonstrates good judgement  and reason without abnormal affect or behaviors.  RELEVANT LABS AND IMAGING: CBC    Component Value Date/Time   WBC 5.7 07/26/2020 1503   WBC 6.7 12/09/2018 1350   RBC 4.14 07/26/2020 1503   RBC 4.14 12/09/2018 1350   HGB 13.8 07/26/2020 1503   HCT 37.4 07/26/2020 1503   PLT 291 07/26/2020 1503   MCV 90 07/26/2020 1503   MCH 33.3 (H) 07/26/2020 1503   MCH 30.7 12/09/2018 1350   MCHC 36.9 (H) 07/26/2020 1503   MCHC 33.2 12/09/2018 1350   RDW 11.6 (L) 07/26/2020 1503   LYMPHSABS 2.0 07/26/2020 1503   MONOABS 0.6 09/29/2018 1218   EOSABS 0.2 07/26/2020 1503   BASOSABS 0.0 07/26/2020 1503    CMP     Component Value Date/Time   NA 139 07/26/2020 1503   K 4.2 07/26/2020 1503   CL 103 07/26/2020 1503   CO2 25 07/26/2020 1503   GLUCOSE 94 07/26/2020 1503   GLUCOSE 106 (H) 12/09/2018 1350   BUN 15 07/26/2020 1503   CREATININE 1.43 (H) 07/26/2020 1503   CREATININE 1.35 (H) 04/21/2016 1437   CALCIUM 9.4 07/26/2020 1503   PROT 7.2 07/26/2020 1503   ALBUMIN 4.3 07/26/2020 1503   AST 15 07/26/2020 1503   ALT 8 07/26/2020 1503   ALKPHOS 134 (H) 07/26/2020 1503   BILITOT 0.5 07/26/2020 1503   GFRNONAA 35 (L) 07/26/2020 1503   GFRNONAA 39 (L) 04/21/2016 1437   GFRAA 41 (L) 07/26/2020 1503   GFRAA 45 (L) 04/21/2016 1437    Assessment: 1.  Fecal incontinence: For the past month off and on, oftentimes solid to full stool, last colonoscopy in 2018, also seeing some blood; consider decrease sphincter  tone versus polyps versus other 2.  Change in bowel habits: As above 3.  Hematochezia: Occasional blood mixed in stool as well as mucus; consider hemorrhoids versus other  Plan: 1.  Scheduled patient for a diagnostic colonoscopy in the [place] with Dr. Loletha Carrow.  Did provide patient with a detailed list of risks for the procedure and she agrees to proceed. 2.  Patient was advised to hold her Plavix for 5 days prior to time of procedure.  We will communicate with her prescribing physician to ensure this acceptable for her. 3.  Patient tells me she has had trouble with the prep before with vomiting.  Prescribed Reglan 10 mg, 2 tablets.  1 tablet to be taken 20 to 30 minutes before the first half of prep and the second before the second half. 4.  Recommend the patient increase fiber.  Discussed starting Metamucil again at least 1 dose daily for now. 5.  Discussed the possibility of anorectal manometry in the future pending results of colonoscopy. 6.  Patient to follow in clinic per recommendations from Dr. Loletha Carrow after time of procedure.  Ellouise Newer, PA-C [facility] 08/14/2020, 1:55 PM  Cc: [name], MD

## 2020-08-14 NOTE — Patient Instructions (Signed)
If you are age 78 or older, your body mass index should be between 23-30. Your Body mass index is 20.7 kg/m. If this is out of the aforementioned range listed, please consider follow up with your Primary Care Provider.  If you are age 8 or younger, your body mass index should be between 19-25. Your Body mass index is 20.7 kg/m. If this is out of the aformentioned range listed, please consider follow up with your Primary Care Provider.   You have been scheduled for a colonoscopy. Please follow written instructions given to you at your visit today.  Please pick up your prep supplies at the pharmacy within the next 1-3 days. If you use inhalers (even only as needed), please bring them with you on the day of your procedure.  We have sent the following medications to your pharmacy for you to pick up at your convenience: Reglan 10 mg  Thank you for choosing me and [facility].  Ellouise Newer, PA-C

## 2020-08-15 ENCOUNTER — Other Ambulatory Visit: Payer: Self-pay

## 2020-08-15 MED ORDER — SUTAB 1479-225-188 MG PO TABS: 1.0000 | ORAL_TABLET | Freq: Once | ORAL | 0 refills | Status: AC

## 2020-08-16 ENCOUNTER — Encounter: Payer: Self-pay | Admitting: Pharmacist

## 2020-08-16 ENCOUNTER — Other Ambulatory Visit: Payer: Self-pay

## 2020-08-16 ENCOUNTER — Ambulatory Visit: Payer: Medicare HMO | Attending: Internal Medicine | Admitting: Pharmacist

## 2020-08-16 VITALS — BP 169/65

## 2020-08-16 DIAGNOSIS — I10 Essential (primary) hypertension: Secondary | ICD-10-CM

## 2020-08-16 NOTE — Progress Notes (Signed)
   S:    PCP: Dr. Wynetta Emery  Patient arrives in good spirits. Presents to the clinic for hypertension management. Patient was referred by Freeman Caldron  on 07/26/2020. At that visit, amlodipine was increased from 5 mg to 10 mg.  Today, pt reports adherence with medications, however she has missed 3 doses over the past few weeks due to delayed mail order shipping. She denies HA, blurred vision, increased leg swelling. She gets nervous when she has to drive and endorses increased anxiety today d/t this.   Current BP Medications include: amlodipine 10 mg, Losartan 100 mg, furosemide 40 BID (pt reports taking only 1 tablet daily) ,Toprol XL 100 mg daily  Dietary habits include: drinks 1 cup of black tea daily, denies using salt Exercise habits include: doesn't exercise Family / Social history: former smoker (quit in 2008); has 1-2 drinks over the weekend  Home BP readings:  - pt brought diary of readings with her today - Systolic ranges 56-256 - Diastolic ranges 38-93  O:  Vitals:   08/16/20 1402  BP: (!) 169/65    Last 3 office BP readings: BP Readings from Last 3 Encounters:  08/16/20 (!) 169/65  08/14/20 136/60  07/26/20 (!) 181/90   BMET    Component Value Date/Time   NA 139 07/26/2020 1503   K 4.2 07/26/2020 1503   CL 103 07/26/2020 1503   CO2 25 07/26/2020 1503   GLUCOSE 94 07/26/2020 1503   GLUCOSE 106 (H) 12/09/2018 1350   BUN 15 07/26/2020 1503   CREATININE 1.43 (H) 07/26/2020 1503   CREATININE 1.35 (H) 04/21/2016 1437   CALCIUM 9.4 07/26/2020 1503   GFRNONAA 35 (L) 07/26/2020 1503   GFRNONAA 39 (L) 04/21/2016 1437   GFRAA 41 (L) 07/26/2020 1503   GFRAA 45 (L) 04/21/2016 1437   Renal function: Estimated Creatinine Clearance: 33.1 mL/min (A) (by C-G formula based on SCr of 1.43 mg/dL (H)).  Clinical ASCVD: Yes  The ASCVD Risk score Mikey Bussing DC Jr., et al., 2013) failed to calculate for the following reasons:   The patient has a prior MI or stroke  diagnosis  A/P: Hypertension longstanding currently uncontrolled on current medications. BG goal <= 130/80. Pt endorses increased anxiety and nervousness today due to dr appt and having to drive to clinic. Home BP readings are closer to goal and white coat HTN suspected. - Continue all medications - F/u telehealth visit in 1-2 weeks to reduce pt's anxiety levels - Counseled on lifestyle modifications for BP control including reduced dietary sodium, increased exercise, adequate sleep  Results reviewed and written information provided. Total time in face-to-face counseling 15 minutes.   F/U televisit in 2 weeks.   Patient seen by:  Haynes Dage, PharmD Candidate  [employer]  [employer] of 2024   Benard Halsted, PharmD, [place], [place] [phone]

## 2020-08-16 NOTE — Telephone Encounter (Signed)
Called and left voicemail for patient to call me back. 

## 2020-08-20 NOTE — Telephone Encounter (Signed)
Called patient and let her know to hold Plavix 5 days prior to her procedure. Patient stated she would hold plavix 5 days before.

## 2020-08-27 ENCOUNTER — Encounter: Payer: Medicare HMO | Admitting: Gastroenterology

## 2020-08-30 ENCOUNTER — Encounter: Payer: Self-pay | Admitting: Pharmacist

## 2020-08-30 ENCOUNTER — Other Ambulatory Visit: Payer: Self-pay

## 2020-08-30 ENCOUNTER — Ambulatory Visit: Payer: Medicare HMO | Attending: Internal Medicine | Admitting: Pharmacist

## 2020-08-30 DIAGNOSIS — I10 Essential (primary) hypertension: Secondary | ICD-10-CM | POA: Diagnosis not present

## 2020-08-30 NOTE — Progress Notes (Signed)
   S:    PCP: Dr. Wynetta Emery  This visit was conducted as a telephone visit. Consent was obtained. Patient is in her home. I am in my office. Only myself and the patient participated in this visit.   Patient in good spirits. Presents to the clinic for hypertension management. Patient was referred by Freeman Caldron  on 07/26/2020. We saw her on 08/16/2020 and made no changes. We suggested that the patient be seen over telephone to assess her BP at home. She has increased anxiety when coming in to the office.   Today, pt reports adherence with medications.  Current BP Medications include: amlodipine 10 mg, Losartan 100 mg, furosemide 40 BID,Toprol XL 100 mg daily  Dietary habits include: drinks 1 cup of black tea daily, denies using salt Exercise habits include: doesn't exercise Family / Social history: former smoker (quit in 2008); has 1-2 drinks over the weekend  O:  There were no vitals filed for this visit.   Home BP readings:  Of note, pt is taking her BP first thing in the morning prior to taking her medications.   BP values at home since Sunday, 08/26/2020: - took during this telephone encounter 131/62 - Wednesday: 148/85 - Tuesday: 148/84 - Monday: 127/78 - Sunday: 142/74  Last 3 office BP readings: BP Readings from Last 3 Encounters:  08/16/20 (!) 169/65  08/14/20 136/60  07/26/20 (!) 181/90   BMET    Component Value Date/Time   NA 139 07/26/2020 1503   K 4.2 07/26/2020 1503   CL 103 07/26/2020 1503   CO2 25 07/26/2020 1503   GLUCOSE 94 07/26/2020 1503   GLUCOSE 106 (H) 12/09/2018 1350   BUN 15 07/26/2020 1503   CREATININE 1.43 (H) 07/26/2020 1503   CREATININE 1.35 (H) 04/21/2016 1437   CALCIUM 9.4 07/26/2020 1503   GFRNONAA 35 (L) 07/26/2020 1503   GFRNONAA 39 (L) 04/21/2016 1437   GFRAA 41 (L) 07/26/2020 1503   GFRAA 45 (L) 04/21/2016 1437   Renal function: CrCl cannot be calculated (Patient's most recent lab result is older than the maximum 21 days  allowed.).  Clinical ASCVD: Yes  The ASCVD Risk score Mikey Bussing DC Jr., et al., 2013) failed to calculate for the following reasons:   The patient has a prior MI or stroke diagnosis  A/P: Hypertension longstanding. It is difficult to assess her control. She has had longstanding poorly controlled HTN, however, her home BP is consistently much better than her clinic values. Today's BP was at goal. I instructed her to take her BP several hours after taking her medications.  BG goal <= 130/80. - Continue all medications - Counseled on lifestyle modifications for BP control including reduced dietary sodium, increased exercise, adequate sleep  Results reviewed and written information provided. Total time counseling 15 minutes.   F/U in 1 month.   Benard Halsted, PharmD, [facility], [place] [phone]

## 2020-08-31 NOTE — Telephone Encounter (Signed)
Left voicemail for patient to call me back about her blood thinner .

## 2020-08-31 NOTE — Telephone Encounter (Signed)
Spoke with patient and explained the clopidrogel is the generic for plavix. Patient stated she understood to hold Clodpidrogel starting today. I also went over prep instructions with patient again and to take reglan an hour before each prep.

## 2020-08-31 NOTE — Telephone Encounter (Signed)
Inbound call from patient requesting a call back please.  States she does not have any medication bottles with the name Plavix on it.

## 2020-09-05 ENCOUNTER — Telehealth: Payer: Self-pay | Admitting: Gastroenterology

## 2020-09-05 ENCOUNTER — Encounter: Payer: Medicare HMO | Admitting: Gastroenterology

## 2020-09-05 NOTE — Telephone Encounter (Signed)
Pt's neighbor (transportation) Felicity Coyer states the pt is sick and will not be able to make it to her procedure today @11am . Does not wish to reschedule at this time.

## 2020-09-05 NOTE — Telephone Encounter (Signed)
Understood, thank you for the information. We will be available if she changes her mind and decides to reschedule the colonoscopy.  - HD

## 2020-10-12 ENCOUNTER — Other Ambulatory Visit: Payer: Self-pay

## 2020-10-12 ENCOUNTER — Encounter: Payer: Self-pay | Admitting: Pharmacist

## 2020-10-12 ENCOUNTER — Ambulatory Visit: Payer: Medicare HMO | Attending: Internal Medicine | Admitting: Pharmacist

## 2020-10-12 VITALS — BP 173/90 | HR 64

## 2020-10-12 DIAGNOSIS — I10 Essential (primary) hypertension: Secondary | ICD-10-CM

## 2020-10-12 NOTE — Progress Notes (Signed)
   S:    PCP: Dr. Wynetta Emery  Patient in good spirits. Presents to the clinic for hypertension management. Patient was referred by Freeman Caldron  on 07/26/2020. We saw her on 08/30/2020 via telephone and made no changes. Her blood pressure at home during that visit was 131/62.  Today, pt reports adherence with medications. She denies chest pain, dyspnea, HA or blurred vision.   Current BP Medications include: amlodipine 10 mg, Losartan 100 mg, furosemide 40 BID, Toprol XL 100 mg daily  Dietary habits include: drinks 1 cup of black tea daily, denies using salt Exercise habits include: doesn't exercise Family / Social history: former smoker (quit in 2008); has 1-2 drinks over the weekend  O:  Vitals:   10/12/20 1416  BP: (!) 173/90  Pulse: 64    Home BP readings:  Of note, pt is taking her BP first thing in the morning prior to taking her medications.   BP values at home:   SBP range: 132 - 143 DBP: 64 - 76   Last 3 office BP readings: BP Readings from Last 3 Encounters:  10/12/20 (!) 173/90  08/16/20 (!) 169/65  08/14/20 136/60   BMET    Component Value Date/Time   NA 139 07/26/2020 1503   K 4.2 07/26/2020 1503   CL 103 07/26/2020 1503   CO2 25 07/26/2020 1503   GLUCOSE 94 07/26/2020 1503   GLUCOSE 106 (H) 12/09/2018 1350   BUN 15 07/26/2020 1503   CREATININE 1.43 (H) 07/26/2020 1503   CREATININE 1.35 (H) 04/21/2016 1437   CALCIUM 9.4 07/26/2020 1503   GFRNONAA 35 (L) 07/26/2020 1503   GFRNONAA 39 (L) 04/21/2016 1437   GFRAA 41 (L) 07/26/2020 1503   GFRAA 45 (L) 04/21/2016 1437   Renal function: CrCl cannot be calculated (Patient's most recent lab result is older than the maximum 21 days allowed.).  Clinical ASCVD: Yes  The ASCVD Risk score Mikey Bussing DC Jr., et al., 2013) failed to calculate for the following reasons:   The patient has a prior MI or stroke diagnosis  A/P: Hypertension longstanding. SBP goal <130 mmHg. It is difficult to assess her control. She has  had longstanding poorly controlled HTN, however, her home BP is consistently much better than her clinic values. Today's BP was above goal but home Bps are acceptable.  - Continue all medications - Counseled on lifestyle modifications for BP control including reduced dietary sodium, increased exercise, adequate sleep  Results reviewed and written information provided. Total time counseling 15 minutes.   F/U in 1 month with PCP.   Benard Halsted, PharmD, [facility], [place] [phone]

## 2020-10-15 IMAGING — CT CT HEAD W/O CM
4 series · 15 of 47 positions shown, 17 images · non-contrast
Comparison: Head CT and brain MRI 02/01/2007

CLINICAL DATA: Vertigo, episodic, peripheral. Dizziness for 1 week.

EXAM:
CT HEAD WITHOUT CONTRAST
TECHNIQUE: Contiguous axial images were obtained from the base of the skull
through the vertex without intravenous contrast.

[Series 3: head wo · axial · 0.48mm/px · z∈[+1096,+1216]mm · 7 of 32 slices shown, 9 images]
[im 4/32  brain]
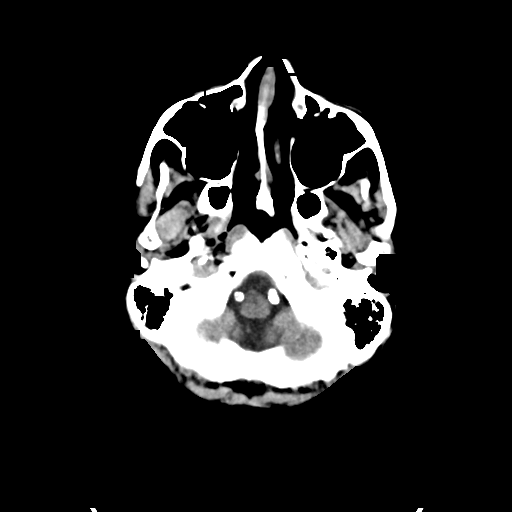
[im 4/32  bone]
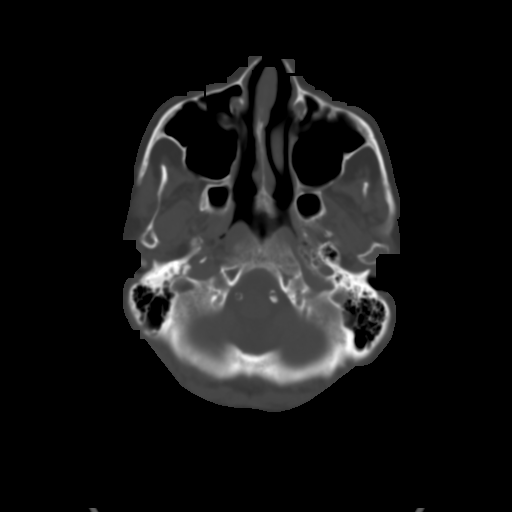
[im 8/32  brain]
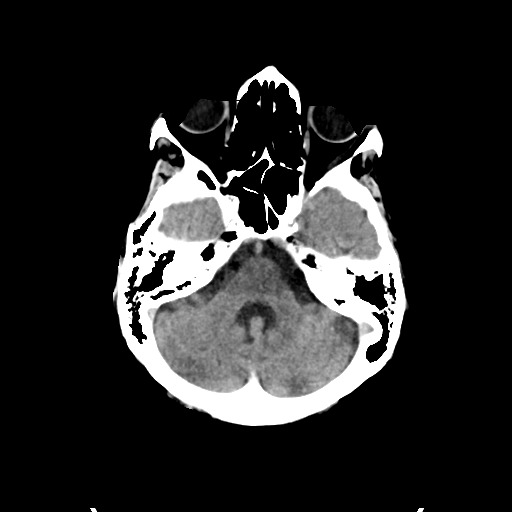
[im 12/32  brain]
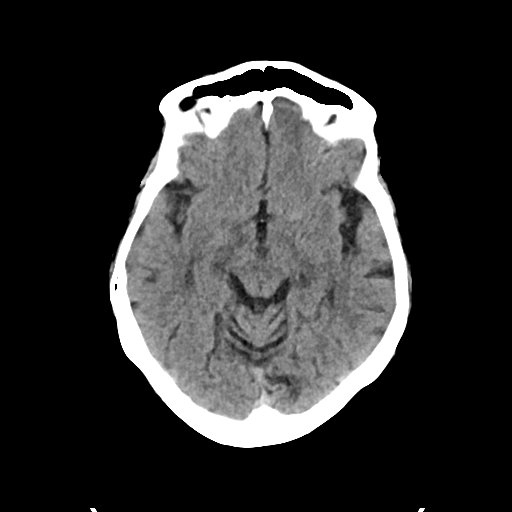
[im 16/32  brain]
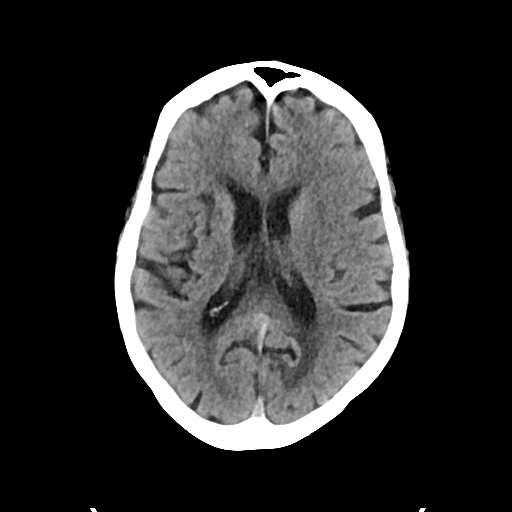
[im 20/32  brain]
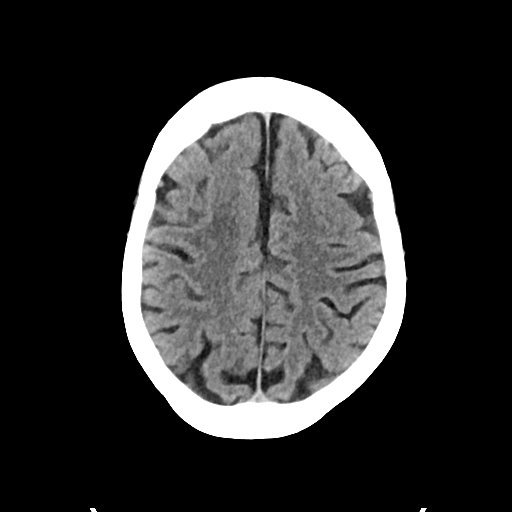
[im 20/32  bone]
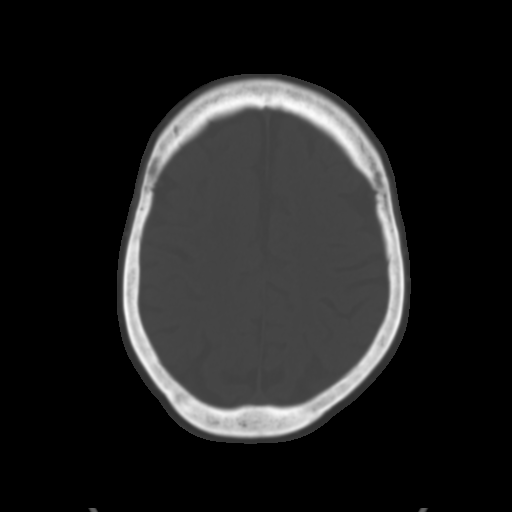
[im 24/32  brain]
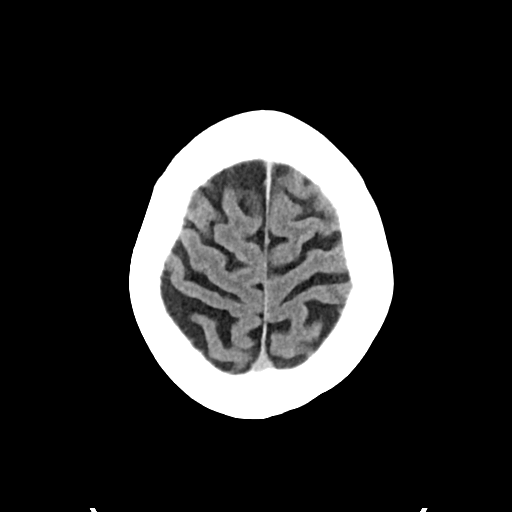
[im 28/32  brain]
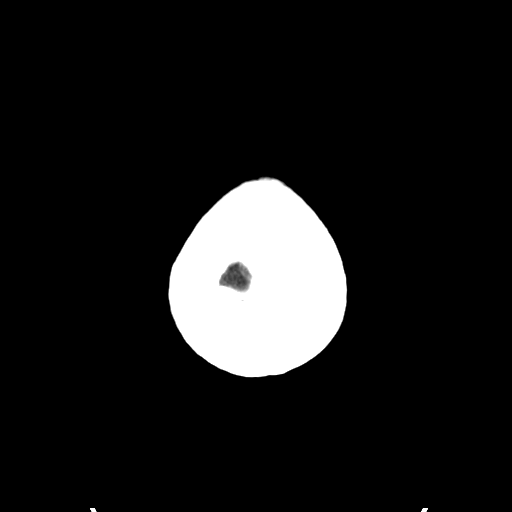

[Series 4: head bone · axial · 0.48mm/px · z∈[+1095,+1111]mm · 2 of 78 slices shown]
[im 8/78  bone]
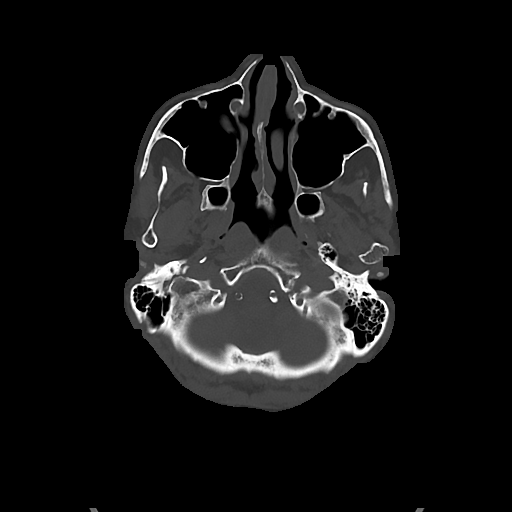
[im 16/78  bone]
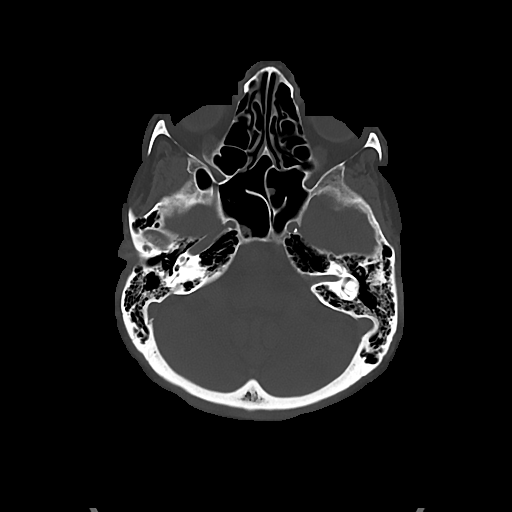

[Series 5: cor soft · coronal · 0.30mm/px · 3 of 68 slices shown]
[im 23/68  brain]
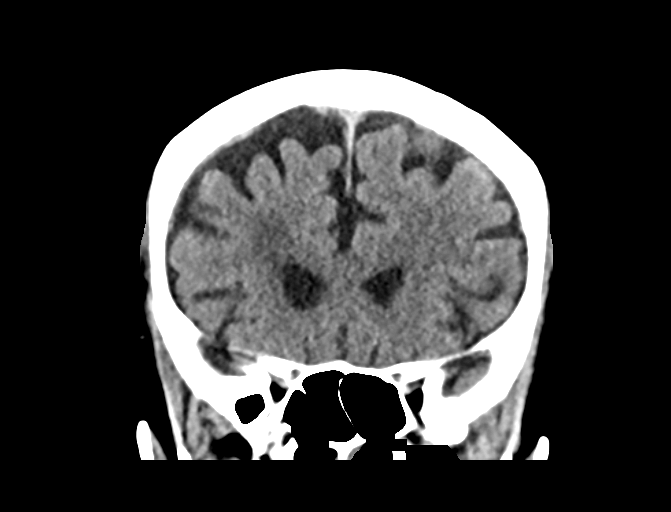
[im 30/68  brain]
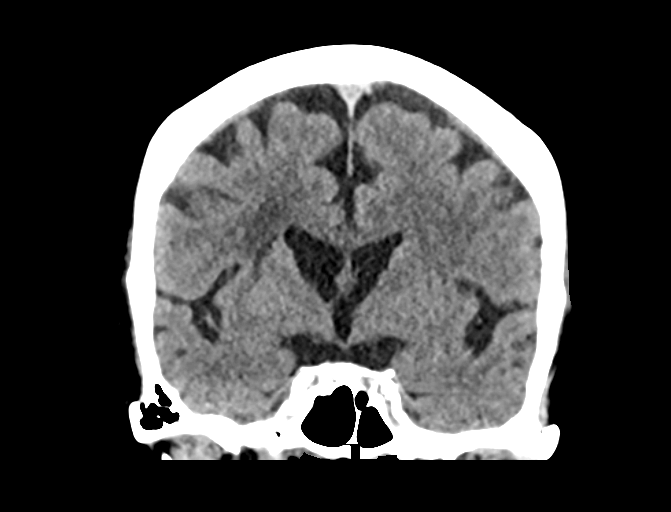
[im 38/68  brain]
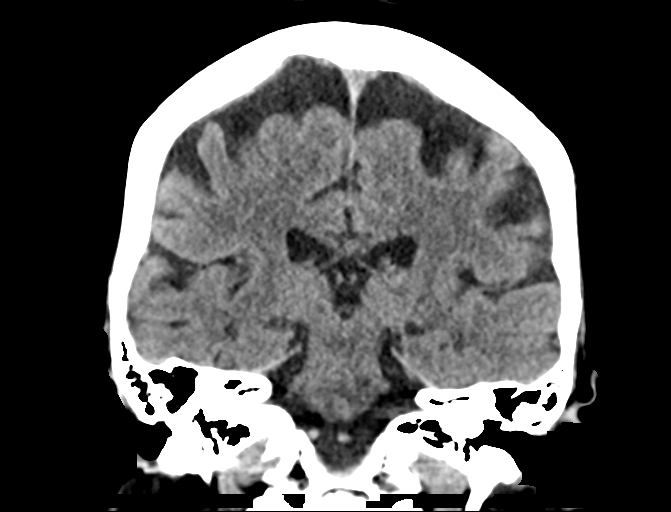

[Series 6: sag soft · sagittal · 0.30mm/px · 3 of 63 slices shown]
[im 21/63  brain]
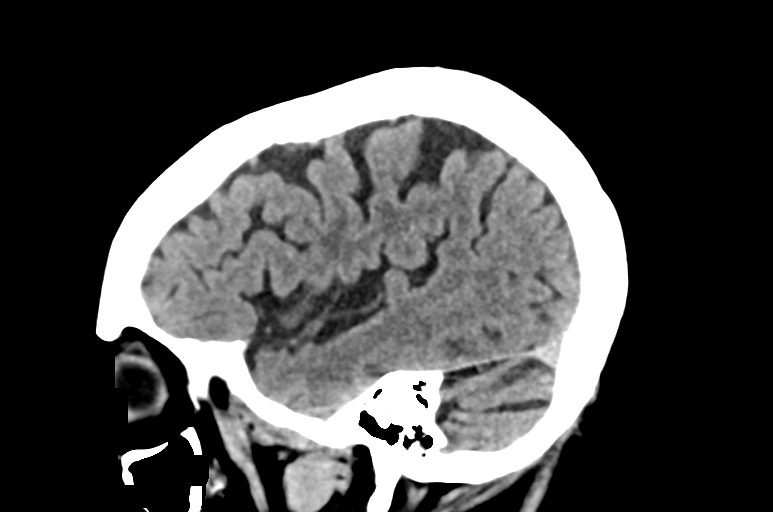
[im 32/63  brain]
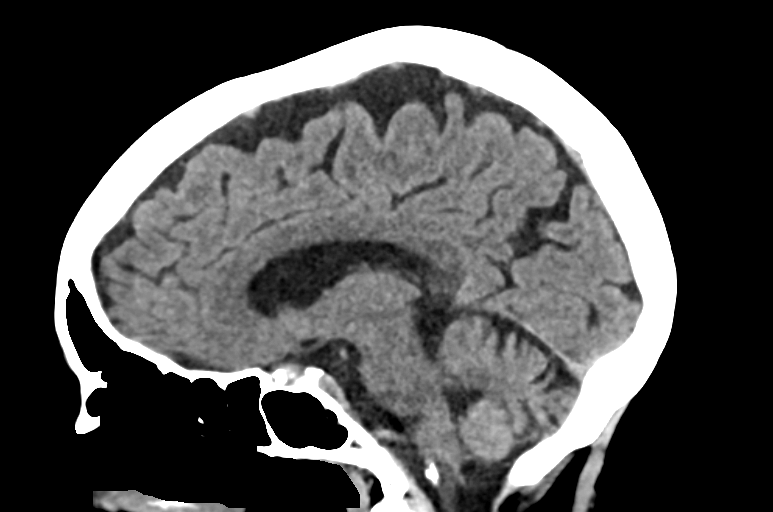
[im 42/63  brain]
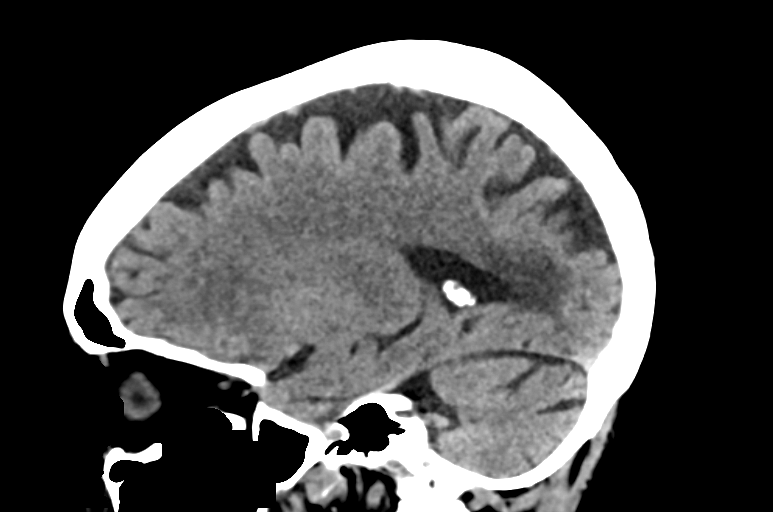

[15 of 47 positions shown; findings below may reference images not displayed]

FINDINGS: Brain: No acute hemorrhage. Multiple remote lacunar infarcts in the
pons, right cerebellum, and right basal ganglia. Remote infarct in
the left occipital lobe. Generalized atrophy with microvascular
ischemia. No evidence of acute ischemia. No hydrocephalus, mass
effect, or midline shift.

Vascular: Mild generalized increased density of intracranial
vasculature without hyperdense vessel. There is skull base
atherosclerosis.

Skull: No fracture or focal lesion.

Sinuses/Orbits: Trace chronic debris in right side of sphenoid
sinus. No sinus fluid level. The mastoid air cells are clear. The
visualized orbits are unremarkable. Prior right cataract resection

Other: None.
IMPRESSION: 1. No acute intracranial abnormality.
2. Generalized atrophy and chronic ischemia. Multiple remote lacunar
infarcts. Remote infarct in the left occipital lobe.

## 2020-10-24 DIAGNOSIS — N183 Chronic kidney disease, stage 3 unspecified: Secondary | ICD-10-CM | POA: Diagnosis not present

## 2020-10-24 DIAGNOSIS — E559 Vitamin D deficiency, unspecified: Secondary | ICD-10-CM | POA: Diagnosis not present

## 2020-11-01 DIAGNOSIS — E213 Hyperparathyroidism, unspecified: Secondary | ICD-10-CM | POA: Diagnosis not present

## 2020-11-01 DIAGNOSIS — R809 Proteinuria, unspecified: Secondary | ICD-10-CM | POA: Diagnosis not present

## 2020-11-01 DIAGNOSIS — N1832 Chronic kidney disease, stage 3b: Secondary | ICD-10-CM | POA: Diagnosis not present

## 2020-11-01 DIAGNOSIS — I129 Hypertensive chronic kidney disease with stage 1 through stage 4 chronic kidney disease, or unspecified chronic kidney disease: Secondary | ICD-10-CM | POA: Diagnosis not present

## 2020-11-07 ENCOUNTER — Other Ambulatory Visit: Payer: Self-pay | Admitting: Internal Medicine

## 2020-11-07 NOTE — Telephone Encounter (Signed)
Future visit in 2 weeks . Last seen 3 weeks ago

## 2020-11-14 ENCOUNTER — Other Ambulatory Visit: Payer: Self-pay | Admitting: Internal Medicine

## 2020-11-14 ENCOUNTER — Encounter: Payer: Self-pay | Admitting: Internal Medicine

## 2020-11-14 MED ORDER — HYDRALAZINE HCL 100 MG PO TABS: 100.0000 mg | ORAL_TABLET | Freq: Three times a day (TID) | ORAL | 1 refills | Status: DC

## 2020-11-14 MED ORDER — VITAMIN D 25 MCG (1000 UNIT) PO TABS: 2000.0000 [IU] | ORAL_TABLET | Freq: Every day | ORAL | 1 refills | Status: AC

## 2020-11-14 NOTE — Progress Notes (Signed)
Patient seen by Dr. Harrie Jeans  On 11/01/2020. CKD stage IIIb: Proteinuria: Nonnephrotic range.  She was in nephrotic range 04/2021.  Patient has declined renal biopsy.  Other work-up including ANA, ANCA, complements all negative.  Continue Cozaar 100 mg daily. HTN: Increase hydralazine to 100 mg 3 times daily.  Would be ideal to reduce amlodipine if tolerated given lower extremity edema. Hyperparathyroidism: Increase vitamin D to 2000 units daily.

## 2020-11-22 ENCOUNTER — Encounter: Payer: Self-pay | Admitting: Internal Medicine

## 2020-11-22 ENCOUNTER — Other Ambulatory Visit: Payer: Self-pay

## 2020-11-22 ENCOUNTER — Ambulatory Visit: Payer: Medicare HMO | Attending: Internal Medicine | Admitting: Internal Medicine

## 2020-11-22 VITALS — BP 200/79 | HR 69 | Resp 16 | Wt 137.6 lb

## 2020-11-22 DIAGNOSIS — E7800 Pure hypercholesterolemia, unspecified: Secondary | ICD-10-CM | POA: Diagnosis not present

## 2020-11-22 DIAGNOSIS — G4709 Other insomnia: Secondary | ICD-10-CM

## 2020-11-22 DIAGNOSIS — I10 Essential (primary) hypertension: Secondary | ICD-10-CM

## 2020-11-22 DIAGNOSIS — E210 Primary hyperparathyroidism: Secondary | ICD-10-CM | POA: Diagnosis not present

## 2020-11-22 DIAGNOSIS — N1832 Chronic kidney disease, stage 3b: Secondary | ICD-10-CM | POA: Diagnosis not present

## 2020-11-22 MED ORDER — TRAZODONE HCL 50 MG PO TABS: 25.0000 mg | ORAL_TABLET | Freq: Every evening | ORAL | 3 refills | Status: DC | PRN

## 2020-11-22 NOTE — Progress Notes (Signed)
Virtual Visit via Telephone Note  I connected with Tina Waller on 11/22/2020 at 4:57 PM by telephone and verified that I am speaking with the correct person using two identifiers  Location: Patient: home Provider: office  Participants: Myself Patient CMA: Tina Waller interpreter:   I discussed the limitations, risks, security and privacy concerns of performing an evaluation and management service by telephone and the availability of in person appointments. I also discussed with the patient that there may be a patient responsible charge related to this service. The patient expressed understanding and agreed to proceed.   History of Present Illness: Patient with history ofHTN,CKD 3b with nephrotic range proteinuria (patient has declined biopsy ), HL, vit D deficiency, osteoporosis on Dexascandone fall 2017,primary hyperparathyroidism,depression,CVA in 2008and 09/2018, vertebral artery stenosisand gout.  Last visit with me was May of last year.  Today's visit is for chronic disease management.  Patient did present in person but subsequently left because she stated she has to get on the road prior to the 5 PM traffic as she has difficult time driving in traffic.  She was agreeable to doing the visit via telephone  Blood pressure today in the office was 200/79. -Checks blood pressure daily.  Home blood pressure today was 155/83.  Readings for the past several days were 146/92, 156/60, 162/73. -Taking Cozaar 100 mg daily, Norvasc 5 mg daily, metoprolol 50 mg 2 tablets daily and furosemide 40 mg twice a day.  She is also supposed to be on hydralazine 100 mg 3 times daily.  Dose was increased by nephrology Dr. Royce Macadamia last month.  However patient not taking.  She states that it causes diarrhea and vomiting. -Denies any lower extremity edema.  No chest pains or shortness of breath. -Saw her nephrologist Dr. Harrie Jeans on the 21st of last month.  Vitamin D was increased to 2000  units daily.  Patient has history of primary hyperparathyroidism.  This is being monitored.  Had parathyroid this enterography scan back in 2020 which revealed normal uptake with no convincing evidence of adenoma.  Insomnia: Reports difficulty in falling asleep and staying asleep.  Usually gets in bed around 1 AM.  Takes more than 30 minutes for her to fall asleep.  She turns off all lights and sounds.  She states that she likes alcoholic beverages and would like to be able to drink a glass of wine at night but she has not been drinking any alcoholic beverages at nighttime.  She falls asleep and then is awake again around 3 AM and has difficulty falling back asleep.  She tried over-the-counter melatonin without good results.  HL: Reports compliance with pravastatin.   Reports good appetite.  No recent falls.  Outpatient Encounter Medications as of 11/22/2020  Medication Sig  . acetaminophen (TYLENOL) 500 MG tablet Take 500-1,000 mg by mouth every 6 (six) hours as needed for moderate pain or headache.  Marland Kitchen amLODipine (NORVASC) 10 MG tablet Take 1 tablet (10 mg total) by mouth daily.  . cholecalciferol (VITAMIN D3) 25 MCG (1000 UNIT) tablet Take 2 tablets (2,000 Units total) by mouth daily. (Patient not taking: Reported on 11/22/2020)  . clopidogrel (PLAVIX) 75 MG tablet Take 1 tablet (75 mg total) by mouth daily.  . furosemide (LASIX) 40 MG tablet Take 1 tablet (40 mg total) by mouth 2 (two) times daily.  Marland Kitchen losartan (COZAAR) 100 MG tablet Take 1 tablet (100 mg total) by mouth daily.  . metoprolol succinate (TOPROL-XL) 50 MG 24 hr  tablet TAKE 2 TABLETS EVERY DAY  . mometasone (ELOCON) 0.1 % ointment Apply topically daily.  . polyethylene glycol (MIRALAX) packet Take 17 g by mouth daily as needed for mild constipation.  . pravastatin (PRAVACHOL) 80 MG tablet Take 1 tablet (80 mg total) by mouth daily. For high cholesterol  . triamcinolone cream (KENALOG) 0.1 % Apply 1 application topically 2 (two)  times daily as needed (rash).  . vitamin B-12 (CYANOCOBALAMIN) 1000 MCG tablet Take 1,000 mcg by mouth daily.  . [DISCONTINUED] hydrALAZINE (APRESOLINE) 100 MG tablet Take 1 tablet (100 mg total) by mouth 3 (three) times daily. (Patient not taking: Reported on 11/22/2020)  . [DISCONTINUED] metoCLOPramide (REGLAN) 10 MG tablet Take 1 tablet (10 mg total) by mouth in the morning and at bedtime. Take one tablet 1 hour before prep (Patient not taking: Reported on 11/22/2020)   No facility-administered encounter medications on file as of 11/22/2020.      Observations/Objective: Blood pressure (!) 200/79, pulse 69, resp. rate 16, weight 137 lb 9.6 oz (62.4 kg), SpO2 98 %.    Chemistry      Component Value Date/Time   NA 139 07/26/2020 1503   K 4.2 07/26/2020 1503   CL 103 07/26/2020 1503   CO2 25 07/26/2020 1503   BUN 15 07/26/2020 1503   CREATININE 1.43 (H) 07/26/2020 1503   CREATININE 1.35 (H) 04/21/2016 1437      Component Value Date/Time   CALCIUM 9.4 07/26/2020 1503   ALKPHOS 134 (H) 07/26/2020 1503   AST 15 07/26/2020 1503   ALT 8 07/26/2020 1503   BILITOT 0.5 07/26/2020 1503     Lab Results  Component Value Date   WBC 5.7 07/26/2020   HGB 13.8 07/26/2020   HCT 37.4 07/26/2020   MCV 90 07/26/2020   PLT 291 07/26/2020   Lab Results  Component Value Date   CHOL 166 07/26/2020   HDL 53 07/26/2020   LDLCALC 91 07/26/2020   TRIG 122 07/26/2020   CHOLHDL 3.1 07/26/2020    Assessment and Plan: 1. Essential hypertension Not at goal.  At risk for cardiovascular events as she has had strokes in the past.  Unable to tolerate hydralazine so I have removed this from her list. -I am thinking about adding  a low-dose of clonidine or isosorbide.  We will consult with Dr. Royce Macadamia. -Continue home blood pressure monitoring.   2. Stage 3b chronic kidney disease ([place]) Followed by nephrology.  Discussed the importance of good blood pressure control to help prevent further decline in  kidney function.  3. Primary hyperparathyroidism ([place]) Being monitored. Bone density ordered in June of last year but she never had it done and wants to hold off for now.  4. Other insomnia Good sleep hygiene discussed and encouraged.  Encouraged her to get in bed around about the same time every night and turn off all lights and sounds.  If unable to fall asleep within 30 minutes she should get up and do something until her eyes get heavy and then try getting back in bed.  We will try her with very low-dose of trazodone.  5. Pure hypercholesterolemia Continue Pravachol.   Follow Up Instructions: 3 mths   I discussed the assessment and treatment plan with the patient. The patient was provided an opportunity to ask questions and all were answered. The patient agreed with the plan and demonstrated an understanding of the instructions.   The patient was advised to call back or seek an in-person evaluation  if the symptoms worsen or if the condition fails to improve as anticipated.  I  Spent 25 minutes on this telephone encounter  Karle Plumber, MD

## 2020-11-23 ENCOUNTER — Telehealth: Payer: Self-pay | Admitting: Internal Medicine

## 2020-11-23 MED ORDER — ISOSORBIDE MONONITRATE ER 30 MG PO TB24: 30.0000 mg | ORAL_TABLET | Freq: Every day | ORAL | 3 refills | Status: DC

## 2020-11-23 NOTE — Telephone Encounter (Signed)
Contacted pt to go over provider response pt is aware and doesn't have any questions or concerns  

## 2021-01-18 ENCOUNTER — Ambulatory Visit: Payer: Medicare HMO | Attending: Internal Medicine | Admitting: Internal Medicine

## 2021-01-18 ENCOUNTER — Encounter: Payer: Self-pay | Admitting: Internal Medicine

## 2021-01-18 ENCOUNTER — Other Ambulatory Visit: Payer: Self-pay

## 2021-01-18 VITALS — BP 180/80 | HR 58 | Resp 16 | Wt 135.6 lb

## 2021-01-18 DIAGNOSIS — I10 Essential (primary) hypertension: Secondary | ICD-10-CM

## 2021-01-18 DIAGNOSIS — R600 Localized edema: Secondary | ICD-10-CM

## 2021-01-18 DIAGNOSIS — G4709 Other insomnia: Secondary | ICD-10-CM | POA: Diagnosis not present

## 2021-01-18 MED ORDER — LOSARTAN POTASSIUM 100 MG PO TABS: 100.0000 mg | ORAL_TABLET | Freq: Every day | ORAL | 1 refills | Status: DC

## 2021-01-18 MED ORDER — FUROSEMIDE 40 MG PO TABS: 40.0000 mg | ORAL_TABLET | Freq: Two times a day (BID) | ORAL | 1 refills | Status: DC

## 2021-01-18 MED ORDER — TRAZODONE HCL 100 MG PO TABS: 100.0000 mg | ORAL_TABLET | Freq: Every evening | ORAL | 1 refills | Status: DC | PRN

## 2021-01-18 MED ORDER — CLOPIDOGREL BISULFATE 75 MG PO TABS: 75.0000 mg | ORAL_TABLET | Freq: Every day | ORAL | 1 refills | Status: DC

## 2021-01-18 NOTE — Patient Instructions (Signed)
Continue to check your blood pressure daily and record your readings.  Bring the recorded readings with you when you come in 2 weeks to see our clinical pharmacist.  He should also bring your home blood pressure device with you to that visit.  You can try increasing the sleep medication called trazodone to 100 mg at bedtime.

## 2021-01-18 NOTE — Progress Notes (Signed)
Patient ID: Tina Waller, female    DOB: [birth year]  MRN: [id number]  CC: Hypertension   Subjective: Tina Waller is a 80 y.o. female who presents for chronic ds management Her concerns today include:  Patient with history of HTN, CKD 3b with nephrotic range proteinuria (patient has declined biopsy ), HL, vit D deficiency, osteoporosis on Dexa scan done fall 2017, primary hyperparathyroidism,  depression, CVA in 2008 and 09/2018, vertebral artery stenosis and gout.  HTN:  on last visit.  We d/c Hydralazine and started Isosorbide instead.  Other meds are Metoprolol, Cozaar, Norvasc and Furosemide.  Reports compliance Checking BP once a day.  She has log with her today.  Most recent readings: 155/94, 139/78, 140/74, 132/74, 136/72, 138/74, 132/71 No CP/SOB.  + Swelling in feet x 1 mth.  Difficult to get feet in shoes at times.  Tries to wear compression socks but weather too warm now to use.  Swelling improves with elevation Needs RF on Furosemide, Plavix and Cozaar  On last visit, she also complained of insomnia.  Sleep hygiene was discussed and encouraged.  We also started her on low-dose of trazodone.  Patient states the medication was not that helpful.  She would fall asleep for about 3 hours and then wakes back up and has problems falling asleep again.  She tells me that she was taking the full 50 mg tablet. Patient Active Problem List   Diagnosis Date Noted   Cellulitis of left lower extremity 04/20/2020   Elevated alkaline phosphatase level 07/28/2019   Microscopic hematuria 09/24/2018   Rectal bleeding 09/24/2018   Insomnia 09/24/2018   Anxiety 09/14/2018   Primary hyperparathyroidism ([place]) 02/10/2018   Vitamin B 12 deficiency 12/11/2017   Pulmonary hypertension, mild ([place]) 12/11/2017   CKD (chronic kidney disease) stage 3, GFR 30-59 ml/min (HCC) 04/17/2017   Stasis dermatitis of both legs 04/17/2017   Osteoporosis 12/22/2016   Hx of gout 12/22/2016   Estrogen  deficiency 05/12/2016   Vitamin D deficiency 05/12/2016   Weight loss 05/12/2016   HTN (hypertension) 06/12/2015   History of CVA (cerebrovascular accident) 03/28/2015     Current Outpatient Medications on File Prior to Visit  Medication Sig Dispense Refill   acetaminophen (TYLENOL) 500 MG tablet Take 500-1,000 mg by mouth every 6 (six) hours as needed for moderate pain or headache.     amLODipine (NORVASC) 10 MG tablet Take 1 tablet (10 mg total) by mouth daily. 90 tablet 3   cholecalciferol (VITAMIN D3) 25 MCG (1000 UNIT) tablet Take 2 tablets (2,000 Units total) by mouth daily. (Patient not taking: Reported on 11/22/2020) 100 tablet 1   clopidogrel (PLAVIX) 75 MG tablet Take 1 tablet (75 mg total) by mouth daily. 90 tablet 1   furosemide (LASIX) 40 MG tablet Take 1 tablet (40 mg total) by mouth 2 (two) times daily. 180 tablet 1   isosorbide mononitrate (IMDUR) 30 MG 24 hr tablet Take 1 tablet (30 mg total) by mouth daily. 30 tablet 3   losartan (COZAAR) 100 MG tablet Take 1 tablet (100 mg total) by mouth daily. 90 tablet 1   metoprolol succinate (TOPROL-XL) 50 MG 24 hr tablet TAKE 2 TABLETS EVERY DAY 180 tablet 0   mometasone (ELOCON) 0.1 % ointment Apply topically daily. 45 g 0   polyethylene glycol (MIRALAX) packet Take 17 g by mouth daily as needed for mild constipation.     pravastatin (PRAVACHOL) 80 MG tablet Take 1 tablet (80 mg total) by mouth  daily. For high cholesterol 90 tablet 1   traZODone (DESYREL) 50 MG tablet Take 0.5-1 tablets (25-50 mg total) by mouth at bedtime as needed for sleep. 30 tablet 3   triamcinolone cream (KENALOG) 0.1 % Apply 1 application topically 2 (two) times daily as needed (rash).     vitamin B-12 (CYANOCOBALAMIN) 1000 MCG tablet Take 1,000 mcg by mouth daily.     No current facility-administered medications on file prior to visit.    Allergies  Allergen Reactions   Lisinopril Cough   Carvedilol Diarrhea   Hydralazine     Vomiting and diarrhea    Lipitor [Atorvastatin] Diarrhea    Social History   Socioeconomic History   Marital status: Divorced    Spouse name: Not on file   Number of children: Not on file   Years of education: Not on file   Highest education level: Not on file  Occupational History   Not on file  Tobacco Use   Smoking status: Former    Pack years: 0.00    Types: Cigarettes    Quit date: 07/14/2006    Years since quitting: 14.5   Smokeless tobacco: Never  Vaping Use   Vaping Use: Never used  Substance and Sexual Activity   Alcohol use: Yes    Alcohol/week: 4.0 standard drinks    Types: 4 Shots of liquor per week    Comment: 4 drinks a week   Drug use: No   Sexual activity: Not Currently  Other Topics Concern   Not on file  Social History Narrative   Not on file   Social Determinants of Health   Financial Resource Strain: Not on file  Food Insecurity: Not on file  Transportation Needs: Not on file  Physical Activity: Not on file  Stress: Not on file  Social Connections: Not on file  Intimate Partner Violence: Not on file    Family History  Problem Relation Age of Onset   Heart disease Mother        CHF   Osteoporosis Neg Hx    Colon cancer Neg Hx    Stomach cancer Neg Hx    Pancreatic cancer Neg Hx     Past Surgical History:  Procedure Laterality Date   ABDOMINAL HYSTERECTOMY     BREAST SURGERY     lumpectomy   BUNIONECTOMY     CATARACT EXTRACTION Left    COLONOSCOPY W/ BIOPSIES AND POLYPECTOMY     MULTIPLE TOOTH EXTRACTIONS     TONGUE BIOPSY Right 12/13/2018   Procedure: RIGHT TONSIL BIOPSY;  Surgeon: Melida Quitter, MD;  Location: [facility];  Service: ENT;  Laterality: Right;    ROS: Review of Systems Negative except as stated above  PHYSICAL EXAM: BP (!) 192/79   Pulse (!) 58   Resp 16   Wt 135 lb 9.6 oz (61.5 kg)   SpO2 95%   BMI 20.02 kg/m   Wt Readings from Last 3 Encounters:  01/18/21 135 lb 9.6 oz (61.5 kg)  11/22/20 137 lb 9.6 oz (62.4 kg)  08/14/20 140 lb 3.2  oz (63.6 kg)    Physical Exam  General appearance - alert, well appearing, and in no distress Mental status - normal mood, behavior, speech, dress, motor activity, and thought processes Chest - clear to auscultation, no wheezes, rales or rhonchi, symmetric air entry Heart - normal rate, regular rhythm, normal S1, S2, no murmurs, rubs, clicks or gallops Extremities - 1-2+ dorsal pedal edema  CMP Latest Ref Rng &  Units 07/26/2020 01/05/2020 12/29/2019  Glucose 65 - 99 mg/dL 94 95 99  BUN 8 - 27 mg/dL 15 24 28(H)  Creatinine 0.57 - 1.00 mg/dL 1.43(H) 1.37(H) 1.51(H)  Sodium 134 - 144 mmol/L 139 139 142  Potassium 3.5 - 5.2 mmol/L 4.2 4.4 4.7  Chloride 96 - 106 mmol/L 103 104 107(H)  CO2 20 - 29 mmol/L 25 21 21   Calcium 8.7 - 10.3 mg/dL 9.4 9.4 9.4  Total Protein 6.0 - 8.5 g/dL 7.2 - 7.3  Total Bilirubin 0.0 - 1.2 mg/dL 0.5 - 0.5  Alkaline Phos 44 - 121 IU/L 134(H) - 155(H)  AST 0 - 40 IU/L 15 - 11  ALT 0 - 32 IU/L 8 - 5   Lipid Panel     Component Value Date/Time   CHOL 166 07/26/2020 1503   TRIG 122 07/26/2020 1503   HDL 53 07/26/2020 1503   CHOLHDL 3.1 07/26/2020 1503   CHOLHDL 5.2 09/14/2018 0511   VLDL 15 09/14/2018 0511   LDLCALC 91 07/26/2020 1503    CBC    Component Value Date/Time   WBC 5.7 07/26/2020 1503   WBC 6.7 12/09/2018 1350   RBC 4.14 07/26/2020 1503   RBC 4.14 12/09/2018 1350   HGB 13.8 07/26/2020 1503   HCT 37.4 07/26/2020 1503   PLT 291 07/26/2020 1503   MCV 90 07/26/2020 1503   MCH 33.3 (H) 07/26/2020 1503   MCH 30.7 12/09/2018 1350   MCHC 36.9 (H) 07/26/2020 1503   MCHC 33.2 12/09/2018 1350   RDW 11.6 (L) 07/26/2020 1503   LYMPHSABS 2.0 07/26/2020 1503   MONOABS 0.6 09/29/2018 1218   EOSABS 0.2 07/26/2020 1503   BASOSABS 0.0 07/26/2020 1503    ASSESSMENT AND PLAN: 1. Essential hypertension Not at goal.  However her home blood pressure readings are a lot better than it is today in the office.  She has taken her medicines already for today.   I recommend she follows up with the clinical pharmacist in 2 weeks and bring her blood pressure readings with her along with her home blood pressure monitoring device so that we can check the blood pressure with our device and hers.  If still elevated, I would increase the isosorbide. - losartan (COZAAR) 100 MG tablet; Take 1 tablet (100 mg total) by mouth daily.  Dispense: 90 tablet; Refill: 1 - furosemide (LASIX) 40 MG tablet; Take 1 tablet (40 mg total) by mouth 2 (two) times daily.  Dispense: 180 tablet; Refill: 1  2. Pedal edema Told patient that Norvasc can cause swelling.  I recommend decreasing the dose of amlodipine from 10 mg to 5 mg daily and increasing the isosorbide instead.  However patient declined.  3. Other insomnia We will try her with an increased dose of the trazodone from 50 mg to 100 mg daily as needed. - traZODone (DESYREL) 100 MG tablet; Take 1 tablet (100 mg total) by mouth at bedtime as needed for sleep.  Dispense: 30 tablet; Refill: 1    Patient was given the opportunity to ask questions.  Patient verbalized understanding of the plan and was able to repeat key elements of the plan.   No orders of the defined types were placed in this encounter.    Requested Prescriptions    No prescriptions requested or ordered in this encounter    No follow-ups on file.  Karle Plumber, MD, FACP

## 2021-02-01 ENCOUNTER — Other Ambulatory Visit: Payer: Self-pay

## 2021-02-01 ENCOUNTER — Encounter: Payer: Self-pay | Admitting: Pharmacist

## 2021-02-01 ENCOUNTER — Ambulatory Visit: Payer: Medicare HMO | Attending: Internal Medicine | Admitting: Pharmacist

## 2021-02-01 VITALS — BP 142/72

## 2021-02-01 DIAGNOSIS — I10 Essential (primary) hypertension: Secondary | ICD-10-CM

## 2021-02-01 MED ORDER — ISOSORBIDE MONONITRATE ER 60 MG PO TB24: 60.0000 mg | ORAL_TABLET | Freq: Every day | ORAL | 0 refills | Status: DC

## 2021-02-01 NOTE — Progress Notes (Signed)
   S:    PCP: Dr. Wynetta Emery  Patient in good spirits. Presents to the clinic for hypertension management. Patient was referred by her PCP on 01/18/2021.   Today, pt reports adherence with medications. She denies chest pain, dyspnea, HA or blurred vision.   Current BP Medications include: amlodipine 10 mg, Losartan 100 mg, furosemide 40 BID, Toprol XL 100 mg daily -She takes isosorbide but this has been used for BP control in coordination  with her Nephrologist d/t limited second line options. She could not tolerate carvedilol, hydralazine. In addition, HCTZ caused hyponatremia in the past.   Dietary habits include: drinks 1 cup of black tea daily, denies using salt Exercise habits include: doesn't exercise Family / Social history: former smoker (quit in 2008); has 1-2 drinks over the weekend  O:  Vitals:   02/01/21 1409  BP: (!) 142/72     Home BP readings:   BP values at home:   SBP range: 133 - 165 DBP: 74 - 95  Last 3 office BP readings: BP Readings from Last 3 Encounters:  02/01/21 (!) 142/72  01/18/21 (!) 180/80  11/22/20 (!) 200/79   BMET    Component Value Date/Time   NA 139 07/26/2020 1503   K 4.2 07/26/2020 1503   CL 103 07/26/2020 1503   CO2 25 07/26/2020 1503   GLUCOSE 94 07/26/2020 1503   GLUCOSE 106 (H) 12/09/2018 1350   BUN 15 07/26/2020 1503   CREATININE 1.43 (H) 07/26/2020 1503   CREATININE 1.35 (H) 04/21/2016 1437   CALCIUM 9.4 07/26/2020 1503   GFRNONAA 35 (L) 07/26/2020 1503   GFRNONAA 39 (L) 04/21/2016 1437   GFRAA 41 (L) 07/26/2020 1503   GFRAA 45 (L) 04/21/2016 1437   Renal function: CrCl cannot be calculated (Patient's most recent lab result is older than the maximum 21 days allowed.).  Clinical ASCVD: Yes  The ASCVD Risk score Mikey Bussing DC Jr., et al., 2013) failed to calculate for the following reasons:   The patient has a prior MI or stroke diagnosis  A/P: Hypertension longstanding. SBP goal <130 mmHg. It is difficult to assess her control.  She has had longstanding poorly controlled HTN,  however, her home BP is consistently much better than her clinic values. Today's BP was above goal but better.  -Increase dose of isosorbide to 60 mg daily.  - Continue all medications - Counseled on lifestyle modifications for BP control including reduced dietary sodium, increased exercise, adequate sleep  Results reviewed and written information provided. Total time counseling 15 minutes.   F/U in 2-3 weeks.   Benard Halsted, PharmD, [facility] [place], [place] [phone]

## 2021-02-22 ENCOUNTER — Ambulatory Visit: Payer: Medicare HMO | Attending: Internal Medicine | Admitting: Pharmacist

## 2021-02-22 ENCOUNTER — Encounter: Payer: Self-pay | Admitting: Pharmacist

## 2021-02-22 ENCOUNTER — Other Ambulatory Visit: Payer: Self-pay

## 2021-02-22 VITALS — BP 125/72

## 2021-02-22 DIAGNOSIS — I10 Essential (primary) hypertension: Secondary | ICD-10-CM

## 2021-02-22 NOTE — Progress Notes (Signed)
   S:    PCP: Dr. Wynetta Emery  This visit was conducted as a telephone visit. Consent was obtained. Patient is in her home. I am in my office. Only myself and the patient participated in this visit.   Patient in good spirits. Presents to the clinic for hypertension management. Patient was referred by Karle Plumber on 01/18/2021. We saw her on 02/01/2021 and increased her dose of isosorbide.    Today, pt reports adherence with medications. She has taken all today.   Current BP Medications include: amlodipine 10 mg, isosorbide mononitrate '60mg'$  daily, Losartan 100 mg, furosemide 40 BID,Toprol XL 100 mg daily  Dietary habits include: drinks 1 cup of black tea daily, denies using salt Exercise habits include: doesn't exercise Family / Social history: former smoker (quit in 2008); has 1-2 drinks over the weekend  O:  Vitals:   02/22/21 1805  BP: 125/72     Home BP readings:   BP values at home since her last encounter with me:  SBP range: 125-144 DBP range: 70-72  Last 3 office BP readings: BP Readings from Last 3 Encounters:  02/22/21 125/72  02/01/21 (!) 142/72  01/18/21 (!) 180/80   BMET    Component Value Date/Time   NA 139 07/26/2020 1503   K 4.2 07/26/2020 1503   CL 103 07/26/2020 1503   CO2 25 07/26/2020 1503   GLUCOSE 94 07/26/2020 1503   GLUCOSE 106 (H) 12/09/2018 1350   BUN 15 07/26/2020 1503   CREATININE 1.43 (H) 07/26/2020 1503   CREATININE 1.35 (H) 04/21/2016 1437   CALCIUM 9.4 07/26/2020 1503   GFRNONAA 35 (L) 07/26/2020 1503   GFRNONAA 39 (L) 04/21/2016 1437   GFRAA 41 (L) 07/26/2020 1503   GFRAA 45 (L) 04/21/2016 1437   Renal function: CrCl cannot be calculated (Patient's most recent lab result is older than the maximum 21 days allowed.).  Clinical ASCVD: Yes  The ASCVD Risk score Mikey Bussing DC Jr., et al., 2013) failed to calculate for the following reasons:   The patient has a prior MI or stroke diagnosis  A/P: Hypertension longstanding. It is difficult to  assess her control. She has had longstanding poorly controlled HTN, however, her home BP levels since her last visit with me have been okay. She took her BP today before my call and it was 125/72 mmHg.  BP goal <130/80 mmHg.  - Continue all medications - Counseled on lifestyle modifications for BP control including reduced dietary sodium, increased exercise, adequate sleep  Results reviewed and written information provided. Total time counseling 15 minutes.   F/U with PCP.   Benard Halsted, PharmD, [facility] [place], [place] [phone]

## 2021-03-24 ENCOUNTER — Telehealth: Payer: Self-pay

## 2021-03-24 NOTE — Telephone Encounter (Signed)
Called pt to schedule her AWV. Went straight to VM. LM for pt to calloffice back to schedule

## 2021-04-19 ENCOUNTER — Ambulatory Visit: Payer: Medicare HMO | Attending: Internal Medicine | Admitting: Internal Medicine

## 2021-04-19 ENCOUNTER — Other Ambulatory Visit: Payer: Self-pay

## 2021-04-19 ENCOUNTER — Encounter: Payer: Self-pay | Admitting: Internal Medicine

## 2021-04-19 VITALS — BP 176/85 | HR 61 | Resp 16 | Wt 133.4 lb

## 2021-04-19 DIAGNOSIS — I10 Essential (primary) hypertension: Secondary | ICD-10-CM | POA: Diagnosis not present

## 2021-04-19 DIAGNOSIS — N1832 Chronic kidney disease, stage 3b: Secondary | ICD-10-CM

## 2021-04-19 DIAGNOSIS — E119 Type 2 diabetes mellitus without complications: Secondary | ICD-10-CM | POA: Diagnosis not present

## 2021-04-19 DIAGNOSIS — M810 Age-related osteoporosis without current pathological fracture: Secondary | ICD-10-CM

## 2021-04-19 DIAGNOSIS — Z23 Encounter for immunization: Secondary | ICD-10-CM | POA: Diagnosis not present

## 2021-04-19 MED ORDER — ISOSORBIDE MONONITRATE ER 60 MG PO TB24: 90.0000 mg | ORAL_TABLET | Freq: Every day | ORAL | 3 refills | Status: DC

## 2021-04-19 NOTE — Patient Instructions (Signed)
Increase isosorbide to 1-1/2 tablet daily.  Continue to monitor your blood pressure.  The goal is 130/80 or lower.  I have referred you for your bone density study and for follow-up with the endocrinologist Dr. Loanne Drilling for the osteoporosis.

## 2021-04-19 NOTE — Progress Notes (Signed)
Patient ID: Tina Waller, female    DOB: Jul 24, 1940  MRN: [id number]  CC: Hypertension   Subjective: Tina Waller is a 80 y.o. female who presents for chronic ds management Her concerns today include:  Patient with history of HTN, CKD 3b with nephrotic range proteinuria (patient has declined biopsy ), HL, vit D deficiency, osteoporosis on Dexa scan done fall 2017, primary hyperparathyroidism,  depression, CVA in 2008 and 09/2018, vertebral artery stenosis and gout.  HTN:  reports compliance with meds but does not have them with her BP this a.m was 153/99.  Other recent readings: 140/74, 132/71, 144/76 No CP/SOB Still has pedal edema.  Declined lower dose of Norvasc on last visit. Reports good appetite   Osteoporosis/Vit D def:  taking vit D. Has not seen Dr. Loanne Drilling in sometime.  Last seen in 2018.  He had placed on ibandronate 150 mg every 30 days.  She took it until prescription ran out but never went back for follow-up.  CKD 3: has f/u with Dr. Royce Macadamia later this mth.  Still making good urine  HM: Due for COVID. Due for flu vaccine Patient Active Problem List   Diagnosis Date Noted   Cellulitis of left lower extremity 04/20/2020   Elevated alkaline phosphatase level 07/28/2019   Microscopic hematuria 09/24/2018   Rectal bleeding 09/24/2018   Insomnia 09/24/2018   Anxiety 09/14/2018   Primary hyperparathyroidism ([place]) 02/10/2018   Vitamin B 12 deficiency 12/11/2017   Pulmonary hypertension, mild ([place]) 12/11/2017   CKD (chronic kidney disease) stage 3, GFR 30-59 ml/min (HCC) 04/17/2017   Stasis dermatitis of both legs 04/17/2017   Osteoporosis 12/22/2016   Hx of gout 12/22/2016   Estrogen deficiency 05/12/2016   Vitamin D deficiency 05/12/2016   Weight loss 05/12/2016   HTN (hypertension) 06/12/2015   History of CVA (cerebrovascular accident) 03/28/2015     Current Outpatient Medications on File Prior to Visit  Medication Sig Dispense Refill   acetaminophen  (TYLENOL) 500 MG tablet Take 500-1,000 mg by mouth every 6 (six) hours as needed for moderate pain or headache.     amLODipine (NORVASC) 10 MG tablet Take 1 tablet (10 mg total) by mouth daily. 90 tablet 3   cholecalciferol (VITAMIN D3) 25 MCG (1000 UNIT) tablet Take 2 tablets (2,000 Units total) by mouth daily. (Patient not taking: Reported on 11/22/2020) 100 tablet 1   clopidogrel (PLAVIX) 75 MG tablet Take 1 tablet (75 mg total) by mouth daily. 90 tablet 1   furosemide (LASIX) 40 MG tablet Take 1 tablet (40 mg total) by mouth 2 (two) times daily. 180 tablet 1   losartan (COZAAR) 100 MG tablet Take 1 tablet (100 mg total) by mouth daily. 90 tablet 1   metoprolol succinate (TOPROL-XL) 50 MG 24 hr tablet TAKE 2 TABLETS EVERY DAY 180 tablet 0   mometasone (ELOCON) 0.1 % ointment Apply topically daily. 45 g 0   polyethylene glycol (MIRALAX) packet Take 17 g by mouth daily as needed for mild constipation.     pravastatin (PRAVACHOL) 80 MG tablet Take 1 tablet (80 mg total) by mouth daily. For high cholesterol 90 tablet 1   traZODone (DESYREL) 100 MG tablet Take 1 tablet (100 mg total) by mouth at bedtime as needed for sleep. 30 tablet 1   triamcinolone cream (KENALOG) 0.1 % Apply 1 application topically 2 (two) times daily as needed (rash).     vitamin B-12 (CYANOCOBALAMIN) 1000 MCG tablet Take 1,000 mcg by mouth daily.  No current facility-administered medications on file prior to visit.    Allergies  Allergen Reactions   Lisinopril Cough   Carvedilol Diarrhea   Hydralazine     Vomiting and diarrhea   Lipitor [Atorvastatin] Diarrhea    Social History   Socioeconomic History   Marital status: Divorced    Spouse name: Not on file   Number of children: Not on file   Years of education: Not on file   Highest education level: Not on file  Occupational History   Not on file  Tobacco Use   Smoking status: Former    Types: Cigarettes    Quit date: 07/14/2006    Years since quitting:  14.7   Smokeless tobacco: Never  Vaping Use   Vaping Use: Never used  Substance and Sexual Activity   Alcohol use: Yes    Alcohol/week: 4.0 standard drinks    Types: 4 Shots of liquor per week    Comment: 4 drinks a week   Drug use: No   Sexual activity: Not Currently  Other Topics Concern   Not on file  Social History Narrative   Not on file   Social Determinants of Health   Financial Resource Strain: Not on file  Food Insecurity: Not on file  Transportation Needs: Not on file  Physical Activity: Not on file  Stress: Not on file  Social Connections: Not on file  Intimate Partner Violence: Not on file    Family History  Problem Relation Age of Onset   Heart disease Mother        CHF   Osteoporosis Neg Hx    Colon cancer Neg Hx    Stomach cancer Neg Hx    Pancreatic cancer Neg Hx     Past Surgical History:  Procedure Laterality Date   ABDOMINAL HYSTERECTOMY     BREAST SURGERY     lumpectomy   BUNIONECTOMY     CATARACT EXTRACTION Left    COLONOSCOPY W/ BIOPSIES AND POLYPECTOMY     MULTIPLE TOOTH EXTRACTIONS     TONGUE BIOPSY Right 12/13/2018   Procedure: RIGHT TONSIL BIOPSY;  Surgeon: Melida Quitter, MD;  Location: [facility];  Service: ENT;  Laterality: Right;    ROS: Review of Systems Negative except as stated above  PHYSICAL EXAM: BP (!) 176/85 (BP Location: Left Arm, Patient Position: Sitting, Cuff Size: Small)   Pulse 61   Resp 16   Wt 133 lb 6.4 oz (60.5 kg)   SpO2 95%   BMI 19.70 kg/m   Wt Readings from Last 3 Encounters:  04/19/21 133 lb 6.4 oz (60.5 kg)  01/18/21 135 lb 9.6 oz (61.5 kg)  11/22/20 137 lb 9.6 oz (62.4 kg)    Physical Exam  General appearance -pleasant somewhat frail elderly female in NAD. Mental status - normal mood, behavior, speech, dress, motor activity, and thought processes Chest - clear to auscultation, no wheezes, rales or rhonchi, symmetric air entry Heart - normal rate, regular rhythm, normal S1, S2, no murmurs, rubs,  clicks or gallops Extremities -1+ pedal edema.   CMP Latest Ref Rng & Units 07/26/2020 01/05/2020 12/29/2019  Glucose 65 - 99 mg/dL 94 95 99  BUN 8 - 27 mg/dL 15 24 28(H)  Creatinine 0.57 - 1.00 mg/dL 1.43(H) 1.37(H) 1.51(H)  Sodium 134 - 144 mmol/L 139 139 142  Potassium 3.5 - 5.2 mmol/L 4.2 4.4 4.7  Chloride 96 - 106 mmol/L 103 104 107(H)  CO2 20 - 29 mmol/L 25 21 21  Calcium 8.7 - 10.3 mg/dL 9.4 9.4 9.4  Total Protein 6.0 - 8.5 g/dL 7.2 - 7.3  Total Bilirubin 0.0 - 1.2 mg/dL 0.5 - 0.5  Alkaline Phos 44 - 121 IU/L 134(H) - 155(H)  AST 0 - 40 IU/L 15 - 11  ALT 0 - 32 IU/L 8 - 5   Lipid Panel     Component Value Date/Time   CHOL 166 07/26/2020 1503   TRIG 122 07/26/2020 1503   HDL 53 07/26/2020 1503   CHOLHDL 3.1 07/26/2020 1503   CHOLHDL 5.2 09/14/2018 0511   VLDL 15 09/14/2018 0511   LDLCALC 91 07/26/2020 1503    CBC    Component Value Date/Time   WBC 5.7 07/26/2020 1503   WBC 6.7 12/09/2018 1350   RBC 4.14 07/26/2020 1503   RBC 4.14 12/09/2018 1350   HGB 13.8 07/26/2020 1503   HCT 37.4 07/26/2020 1503   PLT 291 07/26/2020 1503   MCV 90 07/26/2020 1503   MCH 33.3 (H) 07/26/2020 1503   MCH 30.7 12/09/2018 1350   MCHC 36.9 (H) 07/26/2020 1503   MCHC 33.2 12/09/2018 1350   RDW 11.6 (L) 07/26/2020 1503   LYMPHSABS 2.0 07/26/2020 1503   MONOABS 0.6 09/29/2018 1218   EOSABS 0.2 07/26/2020 1503   BASOSABS 0.0 07/26/2020 1503    ASSESSMENT AND PLAN: 1. Essential hypertension Blood pressure always significantly higher in the office than what she is getting at home.  Historically, blood pressure has been difficult to control.  Besides essential hypertension, she may have a component of whitecoat hypertension.  However given her history of CVA, I would like to see her blood pressure better.  We will try increasing the Imdur to 90 mg daily.  She will continue Cozaar, metoprolol, amlodipine and furosemide.  Advised to continue to monitor blood pressure at home with goal being  130/80 or lower. - CBC - Comprehensive metabolic panel  2. Stage 3b chronic kidney disease ([place]) Fairly stable.  Check chemistry today.  Keep follow-up appointment with nephrologist later this month.  3. Osteoporosis, unspecified osteoporosis type, unspecified pathological fracture presence - DG Bone Density; Future - Ambulatory referral to Endocrinology  4. Need for immunization against influenza - Flu Vaccine QUAD 23mo+IM (Fluarix, Fluzone & Alfiuria Quad PF)  5. Need for shingles vaccine We discussed shingles vaccine.  She has had shingles in the past.  She is agreeable to receiving Shingrix.  She will come back in 2 weeks to get the first shot from our clinical pharmacist.     Patient was given the opportunity to ask questions.  Patient verbalized understanding of the plan and was able to repeat key elements of the plan.   Orders Placed This Encounter  Procedures   DG Bone Density   Flu Vaccine QUAD 25mo+IM (Fluarix, Fluzone & Alfiuria Quad PF)   CBC   Comprehensive metabolic panel   Ambulatory referral to Endocrinology     Requested Prescriptions   Signed Prescriptions Disp Refills   isosorbide mononitrate (IMDUR) 60 MG 24 hr tablet 135 tablet 3    Sig: Take 1.5 tablets (90 mg total) by mouth daily.    Return in about 4 months (around 08/20/2021) for 2 wks with Lurena Joiner for shingles vaccine.  Tina Plumber, MD, FACP

## 2021-04-20 LAB — CBC
Hematocrit: 37.5 % (ref 34.0–46.6)
Hemoglobin: 13.1 g/dL (ref 11.1–15.9)
MCH: 33.2 pg — ABNORMAL HIGH (ref 26.6–33.0)
MCHC: 34.9 g/dL (ref 31.5–35.7)
MCV: 95 fL (ref 79–97)
Platelets: 232 10*3/uL (ref 150–450)
RBC: 3.95 x10E6/uL (ref 3.77–5.28)
RDW: 11.6 % — ABNORMAL LOW (ref 11.7–15.4)
WBC: 5.7 10*3/uL (ref 3.4–10.8)

## 2021-04-20 LAB — COMPREHENSIVE METABOLIC PANEL
ALT: 8 IU/L (ref 0–32)
AST: 12 IU/L (ref 0–40)
Albumin/Globulin Ratio: 1.7 (ref 1.2–2.2)
Albumin: 4.5 g/dL (ref 3.7–4.7)
Alkaline Phosphatase: 123 IU/L — ABNORMAL HIGH (ref 44–121)
BUN/Creatinine Ratio: 21 (ref 12–28)
BUN: 37 mg/dL — ABNORMAL HIGH (ref 8–27)
Bilirubin Total: 0.6 mg/dL (ref 0.0–1.2)
CO2: 19 mmol/L — ABNORMAL LOW (ref 20–29)
Calcium: 9.4 mg/dL (ref 8.7–10.3)
Chloride: 104 mmol/L (ref 96–106)
Creatinine, Ser: 1.78 mg/dL — ABNORMAL HIGH (ref 0.57–1.00)
Globulin, Total: 2.7 g/dL (ref 1.5–4.5)
Glucose: 107 mg/dL — ABNORMAL HIGH (ref 70–99)
Potassium: 4.1 mmol/L (ref 3.5–5.2)
Sodium: 142 mmol/L (ref 134–144)
Total Protein: 7.2 g/dL (ref 6.0–8.5)
eGFR: 29 mL/min/{1.73_m2} — ABNORMAL LOW (ref >59)

## 2021-04-24 ENCOUNTER — Telehealth: Payer: Self-pay

## 2021-04-24 NOTE — Telephone Encounter (Signed)
Contacted pt to go over lab results pt is aware and doesn't have any questions or concerns 

## 2021-04-29 ENCOUNTER — Encounter: Payer: Medicare HMO | Admitting: Internal Medicine

## 2021-04-30 ENCOUNTER — Other Ambulatory Visit: Payer: Self-pay

## 2021-04-30 ENCOUNTER — Ambulatory Visit: Payer: Medicare HMO | Attending: Internal Medicine | Admitting: Pharmacist

## 2021-04-30 DIAGNOSIS — Z23 Encounter for immunization: Secondary | ICD-10-CM

## 2021-04-30 NOTE — Progress Notes (Signed)
Patient presents for vaccination against zoster per orders of Dr. Johnson. Consent given. Counseling provided. No contraindications exists. Vaccine administered without incident.   Luke Van Ausdall, PharmD, BCACP, CPP Clinical Pharmacist [facility] [phone]  

## 2021-05-06 ENCOUNTER — Other Ambulatory Visit: Payer: Self-pay | Admitting: Internal Medicine

## 2021-05-06 DIAGNOSIS — I10 Essential (primary) hypertension: Secondary | ICD-10-CM

## 2021-05-06 NOTE — Telephone Encounter (Signed)
Copied from [place] [id number]. Topic: Quick Communication - Rx Refill/Question >> May 06, 2021  1:38 PM Leward Quan A wrote: Medication: metoprolol succinate (TOPROL-XL) 50 MG 24 hr tablet, furosemide (LASIX) 40 MG tablet   Has the patient contacted their pharmacy? No. Did not call called office first, will contact pharmacy  (Agent: If no, request that the patient contact the pharmacy for the refill. If patient does not wish to contact the pharmacy document the reason why and proceed with request.) (Agent: If yes, when and what did the pharmacy advise?)  Preferred Pharmacy (with phone number or street name): [facility], [place]  Phone:  [phone] Fax:  [phone]    Has the patient been seen for an appointment in the last year OR does the patient have an upcoming appointment? Yes.    Agent: Please be advised that RX refills may take up to 3 business days. We ask that you follow-up with your pharmacy.

## 2021-05-07 DIAGNOSIS — N1832 Chronic kidney disease, stage 3b: Secondary | ICD-10-CM | POA: Diagnosis not present

## 2021-05-07 NOTE — Telephone Encounter (Signed)
Requested medications are on the active medication list yes   Last visit 04/19/21  Future visit scheduled 08/20/21  Notes to clinic creatinine has steadily increased, failed protocol of creatinine in normal range, please assess.  Requested Prescriptions  Pending Prescriptions Disp Refills   furosemide (LASIX) 40 MG tablet 180 tablet 1    Sig: Take 1 tablet (40 mg total) by mouth 2 (two) times daily.     Cardiovascular:  Diuretics - Loop Failed - 05/06/2021  5:40 PM      Failed - Cr in normal range and within 360 days    Creat  Date Value Ref Range Status  04/21/2016 1.35 (H) 0.60 - 0.93 mg/dL Final    Comment:      For patients > or = 78 years of age: The upper reference limit for Creatinine is approximately 13% higher for people identified as African-American.      Creatinine, Ser  Date Value Ref Range Status  04/19/2021 1.78 (H) 0.57 - 1.00 mg/dL Final          Failed - Last BP in normal range    BP Readings from Last 1 Encounters:  04/19/21 (!) 176/85          Passed - K in normal range and within 360 days    Potassium  Date Value Ref Range Status  04/19/2021 4.1 3.5 - 5.2 mmol/L Final          Passed - Ca in normal range and within 360 days    Calcium  Date Value Ref Range Status  04/19/2021 9.4 8.7 - 10.3 mg/dL Final   Calcium, Ion  Date Value Ref Range Status  10/21/2017 1.06 (L) 1.15 - 1.40 mmol/L Final          Passed - Na in normal range and within 360 days    Sodium  Date Value Ref Range Status  04/19/2021 142 134 - 144 mmol/L Final          Passed - Valid encounter within last 6 months    Recent Outpatient Visits           1 week ago Need for shingles vaccine   [place], Jarome Matin, RPH-CPP   2 weeks ago Essential hypertension   [place], Deborah B, MD   2 months ago Essential hypertension   [place],  Jarome Matin, RPH-CPP   3 months ago Essential hypertension   [place], Stephen L, RPH-CPP   3 months ago Essential hypertension   [place], MD       Future Appointments             In 3 months [name], MD [place]             metoprolol succinate (TOPROL-XL) 50 MG 24 hr tablet 180 tablet 0    Sig: Take 2 tablets (100 mg total) by mouth daily. Take with or immediately following a meal.     Cardiovascular:  Beta Blockers Failed - 05/06/2021  5:40 PM      Failed - Last BP in normal range    BP Readings from Last 1 Encounters:  04/19/21 (!) 176/85          Passed - Last Heart Rate in normal range  Pulse Readings from Last 1 Encounters:  04/19/21 61          Passed - Valid encounter within last 6 months    Recent Outpatient Visits           1 week ago Need for shingles vaccine   [place], Jarome Matin, RPH-CPP   2 weeks ago Essential hypertension   [place], MD   2 months ago Essential hypertension   [place], Jarome Matin, RPH-CPP   3 months ago Essential hypertension   [place], Stephen L, RPH-CPP   3 months ago Essential hypertension   [place], MD       Future Appointments             In 3 months Wynetta Emery Dalbert Batman, MD [place]

## 2021-05-07 NOTE — Telephone Encounter (Signed)
Requested Prescriptions  Pending Prescriptions Disp Refills  . metoprolol succinate (TOPROL-XL) 50 MG 24 hr tablet [Pharmacy Med Name: METOPROLOL SUCCINATE ER 50 MG Tablet Extended Release 24 Hour] 180 tablet 1    Sig: TAKE 2 TABLETS EVERY DAY     Cardiovascular:  Beta Blockers Failed - 05/06/2021  2:59 PM      Failed - Last BP in normal range    BP Readings from Last 1 Encounters:  04/19/21 (!) 176/85         Passed - Last Heart Rate in normal range    Pulse Readings from Last 1 Encounters:  04/19/21 61         Passed - Valid encounter within last 6 months    Recent Outpatient Visits          1 week ago Need for shingles vaccine   [place], Jarome Matin, RPH-CPP   2 weeks ago Essential hypertension   [place], MD   2 months ago Essential hypertension   [place], Jarome Matin, RPH-CPP   3 months ago Essential hypertension   [place], Jarome Matin, RPH-CPP   3 months ago Essential hypertension   [place], MD      Future Appointments            In 3 months Wynetta Emery, Dalbert Batman, MD [place]

## 2021-05-13 DIAGNOSIS — R809 Proteinuria, unspecified: Secondary | ICD-10-CM | POA: Diagnosis not present

## 2021-05-13 DIAGNOSIS — I129 Hypertensive chronic kidney disease with stage 1 through stage 4 chronic kidney disease, or unspecified chronic kidney disease: Secondary | ICD-10-CM | POA: Diagnosis not present

## 2021-05-13 DIAGNOSIS — N1832 Chronic kidney disease, stage 3b: Secondary | ICD-10-CM | POA: Diagnosis not present

## 2021-05-13 DIAGNOSIS — E213 Hyperparathyroidism, unspecified: Secondary | ICD-10-CM | POA: Diagnosis not present

## 2021-05-22 ENCOUNTER — Encounter: Payer: Self-pay | Admitting: Internal Medicine

## 2021-05-22 NOTE — Progress Notes (Signed)
Seen by Dr. Harrie Jeans on 05/13/2021.  Blood pressure surprisingly on that visit was 120/82.  She had trace to 1+ pedal edema. CKD 3B: Furosemide reduced from 40 mg twice daily to once a day.  Patient told to increase back to twice a daily dosing if shortness of breath or swelling worsens. Hyperparathyroidism: Calcium normal, sestamibi scan negative for adenoma.  Only potential identifiable consequence to date is small renal calculus.  Unless issues with hypercalcemia, given her age and other comorbidities, she recommends not pursuing further.  Increase vitamin D to 2000 units daily.

## 2021-08-06 ENCOUNTER — Other Ambulatory Visit: Payer: Self-pay | Admitting: Physician Assistant

## 2021-08-06 ENCOUNTER — Other Ambulatory Visit: Payer: Self-pay | Admitting: Internal Medicine

## 2021-08-06 DIAGNOSIS — E7800 Pure hypercholesterolemia, unspecified: Secondary | ICD-10-CM

## 2021-08-06 DIAGNOSIS — Z8673 Personal history of transient ischemic attack (TIA), and cerebral infarction without residual deficits: Secondary | ICD-10-CM

## 2021-08-06 DIAGNOSIS — I10 Essential (primary) hypertension: Secondary | ICD-10-CM

## 2021-08-06 NOTE — Telephone Encounter (Signed)
Requested medication (s) are due for refill today:   Yes for both  Requested medication (s) are on the active medication list:   Yes for both  Future visit scheduled:   Yes 08/20/2021   Last ordered: 07/26/2020 for both  Returned because protocol failed because labs are due.     Requested Prescriptions  Pending Prescriptions Disp Refills   pravastatin (PRAVACHOL) 80 MG tablet [Pharmacy Med Name: PRAVASTATIN SODIUM 80 MG Tablet] 90 tablet 1    Sig: TAKE 1 TABLET EVERY DAY FOR HIGH CHOLESTEROL     Cardiovascular:  Antilipid - Statins Failed - 08/06/2021 10:13 AM      Failed - Total Cholesterol in normal range and within 360 days    Cholesterol, Total  Date Value Ref Range Status  07/26/2020 166 100 - 199 mg/dL Final          Failed - LDL in normal range and within 360 days    LDL Chol Calc (NIH)  Date Value Ref Range Status  07/26/2020 91 0 - 99 mg/dL Final          Failed - HDL in normal range and within 360 days    HDL  Date Value Ref Range Status  07/26/2020 53 >39 mg/dL Final          Failed - Triglycerides in normal range and within 360 days    Triglycerides  Date Value Ref Range Status  07/26/2020 122 0 - 149 mg/dL Final          Passed - Patient is not pregnant      Passed - Valid encounter within last 12 months    Recent Outpatient Visits           3 months ago Need for shingles vaccine   [place], Jarome Matin, RPH-CPP   3 months ago Essential hypertension   [place], Deborah B, MD   5 months ago Essential hypertension   [place], Stephen L, RPH-CPP   6 months ago Essential hypertension   [place], Jarome Matin, RPH-CPP   6 months ago Essential hypertension   [place], MD       Future Appointments             In 2 weeks [name], MD [place]             amLODipine ([place]) 10 MG tablet [Pharmacy Med Name: AMLODIPINE BESYLATE 10 MG Tablet] 90 tablet 3    Sig: TAKE 1 TABLET EVERY DAY     Cardiovascular:  Calcium Channel Blockers Failed - 08/06/2021 10:13 AM      Failed - Last BP in normal range    BP Readings from Last 1 Encounters:  04/19/21 (!) 176/85          Passed - Valid encounter within last 6 months    Recent Outpatient Visits           3 months ago Need for shingles vaccine   [place], Jarome Matin, RPH-CPP   3 months ago Essential hypertension   [place], MD   5 months ago Essential hypertension   [place], RPH-CPP  6 months ago Essential hypertension   [place], Jarome Matin, RPH-CPP   6 months ago Essential hypertension   [place], MD       Future Appointments             In 2 weeks Wynetta Emery Dalbert Batman, MD [place]

## 2021-08-06 NOTE — Telephone Encounter (Signed)
Requested Prescriptions  Pending Prescriptions Disp Refills   losartan (COZAAR) 100 MG tablet [Pharmacy Med Name: LOSARTAN POTASSIUM 100 MG Tablet] 90 tablet 0    Sig: TAKE 1 TABLET (100 MG TOTAL) BY MOUTH DAILY.     Cardiovascular:  Angiotensin Receptor Blockers Failed - 08/06/2021 10:13 AM      Failed - Cr in normal range and within 180 days    Creat  Date Value Ref Range Status  04/21/2016 1.35 (H) 0.60 - 0.93 mg/dL Final    Comment:      For patients > or = 79 years of age: The upper reference limit for Creatinine is approximately 13% higher for people identified as African-American.      Creatinine, Ser  Date Value Ref Range Status  04/19/2021 1.78 (H) 0.57 - 1.00 mg/dL Final         Failed - Last BP in normal range    BP Readings from Last 1 Encounters:  04/19/21 (!) 176/85         Passed - K in normal range and within 180 days    Potassium  Date Value Ref Range Status  04/19/2021 4.1 3.5 - 5.2 mmol/L Final         Passed - Patient is not pregnant      Passed - Valid encounter within last 6 months    Recent Outpatient Visits          3 months ago Need for shingles vaccine   [place], Jarome Matin, RPH-CPP   3 months ago Essential hypertension   [place], MD   5 months ago Essential hypertension   [place], Stephen L, RPH-CPP   6 months ago Essential hypertension   [place], Stephen L, RPH-CPP   6 months ago Essential hypertension   [place], MD      Future Appointments            In 2 weeks [name], MD [place]            furosemide (LASIX) 40 MG tablet [Pharmacy Med Name: FUROSEMIDE 40 MG Tablet] 180 tablet 0    Sig: TAKE 1 TABLET (40 MG TOTAL) BY MOUTH 2 (TWO) TIMES DAILY.      Cardiovascular:  Diuretics - Loop Failed - 08/06/2021 10:13 AM      Failed - Cr in normal range and within 360 days    Creat  Date Value Ref Range Status  04/21/2016 1.35 (H) 0.60 - 0.93 mg/dL Final    Comment:      For patients > or = 79 years of age: The upper reference limit for Creatinine is approximately 13% higher for people identified as African-American.      Creatinine, Ser  Date Value Ref Range Status  04/19/2021 1.78 (H) 0.57 - 1.00 mg/dL Final         Failed - Last BP in normal range    BP Readings from Last 1 Encounters:  04/19/21 (!) 176/85         Passed - K in normal range and within 360 days    Potassium  Date Value Ref Range Status  04/19/2021 4.1 3.5 - 5.2 mmol/L Final         Passed - Ca in normal range  and within 360 days    Calcium  Date Value Ref Range Status  04/19/2021 9.4 8.7 - 10.3 mg/dL Final   Calcium, Ion  Date Value Ref Range Status  10/21/2017 1.06 (L) 1.15 - 1.40 mmol/L Final         Passed - Na in normal range and within 360 days    Sodium  Date Value Ref Range Status  04/19/2021 142 134 - 144 mmol/L Final         Passed - Valid encounter within last 6 months    Recent Outpatient Visits          3 months ago Need for shingles vaccine   [place], Jarome Matin, RPH-CPP   3 months ago Essential hypertension   [place], MD   5 months ago Essential hypertension   [place], Stephen L, RPH-CPP   6 months ago Essential hypertension   [place], Stephen L, RPH-CPP   6 months ago Essential hypertension   [place], MD      Future Appointments            In 2 weeks [name], MD [place]            clopidogrel (PLAVIX) 75 MG tablet [Pharmacy Med  Name: CLOPIDOGREL 75 MG Tablet] 90 tablet 0    Sig: TAKE 1 TABLET (75 MG TOTAL) BY MOUTH DAILY.     Hematology: Antiplatelets - clopidogrel Failed - 08/06/2021 10:13 AM      Failed - Evaluate AST, ALT within 2 months of therapy initiation.      Passed - ALT in normal range and within 360 days    ALT  Date Value Ref Range Status  04/19/2021 8 0 - 32 IU/L Final         Passed - AST in normal range and within 360 days    AST  Date Value Ref Range Status  04/19/2021 12 0 - 40 IU/L Final         Passed - HCT in normal range and within 180 days    Hematocrit  Date Value Ref Range Status  04/19/2021 37.5 34.0 - 46.6 % Final         Passed - HGB in normal range and within 180 days    Hemoglobin  Date Value Ref Range Status  04/19/2021 13.1 11.1 - 15.9 g/dL Final         Passed - PLT in normal range and within 180 days    Platelets  Date Value Ref Range Status  04/19/2021 232 150 - 450 x10E3/uL Final         Passed - Valid encounter within last 6 months    Recent Outpatient Visits          3 months ago Need for shingles vaccine   [place], Jarome Matin, RPH-CPP   3 months ago Essential hypertension   [place], MD   5 months ago Essential hypertension   [place], Stephen L, RPH-CPP   6 months ago Essential hypertension   [place], Annie Main L, RPH-CPP   6 months ago Essential hypertension  [place], MD      Future Appointments            In 2 weeks [name], MD [place]

## 2021-08-10 ENCOUNTER — Inpatient Hospital Stay (HOSPITAL_COMMUNITY)
Admission: EM | Admit: 2021-08-10 | Discharge: 2021-08-21 | DRG: 374 | Disposition: A | Discharge status: Skilled Nursing Facility | Payer: Medicare HMO | Attending: Internal Medicine | Admitting: Internal Medicine

## 2021-08-10 DIAGNOSIS — E1122 Type 2 diabetes mellitus with diabetic chronic kidney disease: Secondary | ICD-10-CM | POA: Diagnosis not present

## 2021-08-10 DIAGNOSIS — N281 Cyst of kidney, acquired: Secondary | ICD-10-CM | POA: Diagnosis not present

## 2021-08-10 DIAGNOSIS — M6281 Muscle weakness (generalized): Secondary | ICD-10-CM | POA: Diagnosis not present

## 2021-08-10 DIAGNOSIS — I10 Essential (primary) hypertension: Secondary | ICD-10-CM | POA: Diagnosis not present

## 2021-08-10 DIAGNOSIS — K551 Chronic vascular disorders of intestine: Secondary | ICD-10-CM | POA: Diagnosis not present

## 2021-08-10 DIAGNOSIS — Z79899 Other long term (current) drug therapy: Secondary | ICD-10-CM

## 2021-08-10 DIAGNOSIS — K219 Gastro-esophageal reflux disease without esophagitis: Secondary | ICD-10-CM | POA: Diagnosis present

## 2021-08-10 DIAGNOSIS — K5730 Diverticulosis of large intestine without perforation or abscess without bleeding: Secondary | ICD-10-CM | POA: Diagnosis not present

## 2021-08-10 DIAGNOSIS — D490 Neoplasm of unspecified behavior of digestive system: Secondary | ICD-10-CM | POA: Diagnosis not present

## 2021-08-10 DIAGNOSIS — Z8673 Personal history of transient ischemic attack (TIA), and cerebral infarction without residual deficits: Secondary | ICD-10-CM

## 2021-08-10 DIAGNOSIS — J984 Other disorders of lung: Secondary | ICD-10-CM | POA: Diagnosis not present

## 2021-08-10 DIAGNOSIS — K449 Diaphragmatic hernia without obstruction or gangrene: Secondary | ICD-10-CM | POA: Diagnosis present

## 2021-08-10 DIAGNOSIS — J90 Pleural effusion, not elsewhere classified: Secondary | ICD-10-CM | POA: Diagnosis not present

## 2021-08-10 DIAGNOSIS — K769 Liver disease, unspecified: Secondary | ICD-10-CM | POA: Diagnosis not present

## 2021-08-10 DIAGNOSIS — F32A Depression, unspecified: Secondary | ICD-10-CM | POA: Diagnosis present

## 2021-08-10 DIAGNOSIS — E213 Hyperparathyroidism, unspecified: Secondary | ICD-10-CM | POA: Diagnosis present

## 2021-08-10 DIAGNOSIS — G4700 Insomnia, unspecified: Secondary | ICD-10-CM | POA: Diagnosis present

## 2021-08-10 DIAGNOSIS — K31811 Angiodysplasia of stomach and duodenum with bleeding: Secondary | ICD-10-CM | POA: Diagnosis not present

## 2021-08-10 DIAGNOSIS — C20 Malignant neoplasm of rectum: Secondary | ICD-10-CM | POA: Diagnosis not present

## 2021-08-10 DIAGNOSIS — M109 Gout, unspecified: Secondary | ICD-10-CM | POA: Diagnosis present

## 2021-08-10 DIAGNOSIS — Y92009 Unspecified place in unspecified non-institutional (private) residence as the place of occurrence of the external cause: Secondary | ICD-10-CM | POA: Diagnosis not present

## 2021-08-10 DIAGNOSIS — R41841 Cognitive communication deficit: Secondary | ICD-10-CM | POA: Diagnosis not present

## 2021-08-10 DIAGNOSIS — K222 Esophageal obstruction: Secondary | ICD-10-CM | POA: Diagnosis present

## 2021-08-10 DIAGNOSIS — K635 Polyp of colon: Secondary | ICD-10-CM | POA: Diagnosis not present

## 2021-08-10 DIAGNOSIS — Z8249 Family history of ischemic heart disease and other diseases of the circulatory system: Secondary | ICD-10-CM

## 2021-08-10 DIAGNOSIS — I272 Pulmonary hypertension, unspecified: Secondary | ICD-10-CM | POA: Diagnosis present

## 2021-08-10 DIAGNOSIS — Z90710 Acquired absence of both cervix and uterus: Secondary | ICD-10-CM

## 2021-08-10 DIAGNOSIS — K641 Second degree hemorrhoids: Secondary | ICD-10-CM | POA: Diagnosis present

## 2021-08-10 DIAGNOSIS — N8189 Other female genital prolapse: Secondary | ICD-10-CM | POA: Diagnosis not present

## 2021-08-10 DIAGNOSIS — K319 Disease of stomach and duodenum, unspecified: Secondary | ICD-10-CM | POA: Diagnosis not present

## 2021-08-10 DIAGNOSIS — R54 Age-related physical debility: Secondary | ICD-10-CM | POA: Diagnosis present

## 2021-08-10 DIAGNOSIS — E119 Type 2 diabetes mellitus without complications: Secondary | ICD-10-CM | POA: Diagnosis not present

## 2021-08-10 DIAGNOSIS — K259 Gastric ulcer, unspecified as acute or chronic, without hemorrhage or perforation: Secondary | ICD-10-CM | POA: Diagnosis not present

## 2021-08-10 DIAGNOSIS — Z7902 Long term (current) use of antithrombotics/antiplatelets: Secondary | ICD-10-CM

## 2021-08-10 DIAGNOSIS — M419 Scoliosis, unspecified: Secondary | ICD-10-CM | POA: Diagnosis not present

## 2021-08-10 DIAGNOSIS — K4020 Bilateral inguinal hernia, without obstruction or gangrene, not specified as recurrent: Secondary | ICD-10-CM | POA: Diagnosis not present

## 2021-08-10 DIAGNOSIS — R1312 Dysphagia, oropharyngeal phase: Secondary | ICD-10-CM | POA: Diagnosis not present

## 2021-08-10 DIAGNOSIS — R509 Fever, unspecified: Secondary | ICD-10-CM

## 2021-08-10 DIAGNOSIS — J80 Acute respiratory distress syndrome: Secondary | ICD-10-CM | POA: Diagnosis not present

## 2021-08-10 DIAGNOSIS — D122 Benign neoplasm of ascending colon: Secondary | ICD-10-CM | POA: Diagnosis present

## 2021-08-10 DIAGNOSIS — N183 Chronic kidney disease, stage 3 unspecified: Secondary | ICD-10-CM | POA: Diagnosis not present

## 2021-08-10 DIAGNOSIS — J439 Emphysema, unspecified: Secondary | ICD-10-CM | POA: Diagnosis not present

## 2021-08-10 DIAGNOSIS — M6289 Other specified disorders of muscle: Secondary | ICD-10-CM | POA: Diagnosis not present

## 2021-08-10 DIAGNOSIS — R262 Difficulty in walking, not elsewhere classified: Secondary | ICD-10-CM | POA: Diagnosis not present

## 2021-08-10 DIAGNOSIS — D696 Thrombocytopenia, unspecified: Secondary | ICD-10-CM | POA: Diagnosis not present

## 2021-08-10 DIAGNOSIS — D492 Neoplasm of unspecified behavior of bone, soft tissue, and skin: Secondary | ICD-10-CM | POA: Diagnosis not present

## 2021-08-10 DIAGNOSIS — U071 COVID-19: Secondary | ICD-10-CM | POA: Diagnosis not present

## 2021-08-10 DIAGNOSIS — R2689 Other abnormalities of gait and mobility: Secondary | ICD-10-CM | POA: Diagnosis not present

## 2021-08-10 DIAGNOSIS — R498 Other voice and resonance disorders: Secondary | ICD-10-CM | POA: Diagnosis not present

## 2021-08-10 DIAGNOSIS — Z7401 Bed confinement status: Secondary | ICD-10-CM | POA: Diagnosis not present

## 2021-08-10 DIAGNOSIS — I129 Hypertensive chronic kidney disease with stage 1 through stage 4 chronic kidney disease, or unspecified chronic kidney disease: Secondary | ICD-10-CM | POA: Diagnosis present

## 2021-08-10 DIAGNOSIS — D62 Acute posthemorrhagic anemia: Secondary | ICD-10-CM | POA: Diagnosis not present

## 2021-08-10 DIAGNOSIS — D128 Benign neoplasm of rectum: Secondary | ICD-10-CM | POA: Diagnosis not present

## 2021-08-10 DIAGNOSIS — F419 Anxiety disorder, unspecified: Secondary | ICD-10-CM | POA: Diagnosis present

## 2021-08-10 DIAGNOSIS — Z87891 Personal history of nicotine dependence: Secondary | ICD-10-CM

## 2021-08-10 DIAGNOSIS — R059 Cough, unspecified: Secondary | ICD-10-CM | POA: Diagnosis not present

## 2021-08-10 DIAGNOSIS — N179 Acute kidney failure, unspecified: Secondary | ICD-10-CM | POA: Diagnosis not present

## 2021-08-10 DIAGNOSIS — J449 Chronic obstructive pulmonary disease, unspecified: Secondary | ICD-10-CM | POA: Diagnosis not present

## 2021-08-10 DIAGNOSIS — R58 Hemorrhage, not elsewhere classified: Secondary | ICD-10-CM | POA: Diagnosis not present

## 2021-08-10 DIAGNOSIS — Z681 Body mass index (BMI) 19 or less, adult: Secondary | ICD-10-CM

## 2021-08-10 DIAGNOSIS — K922 Gastrointestinal hemorrhage, unspecified: Secondary | ICD-10-CM | POA: Diagnosis present

## 2021-08-10 DIAGNOSIS — D631 Anemia in chronic kidney disease: Secondary | ICD-10-CM | POA: Diagnosis present

## 2021-08-10 DIAGNOSIS — R200 Anesthesia of skin: Secondary | ICD-10-CM | POA: Diagnosis not present

## 2021-08-10 DIAGNOSIS — T45525A Adverse effect of antithrombotic drugs, initial encounter: Secondary | ICD-10-CM | POA: Diagnosis present

## 2021-08-10 DIAGNOSIS — J9811 Atelectasis: Secondary | ICD-10-CM | POA: Diagnosis not present

## 2021-08-10 DIAGNOSIS — N200 Calculus of kidney: Secondary | ICD-10-CM | POA: Diagnosis not present

## 2021-08-10 DIAGNOSIS — E43 Unspecified severe protein-calorie malnutrition: Secondary | ICD-10-CM | POA: Diagnosis not present

## 2021-08-10 DIAGNOSIS — K6289 Other specified diseases of anus and rectum: Secondary | ICD-10-CM | POA: Diagnosis not present

## 2021-08-10 DIAGNOSIS — M6258 Muscle wasting and atrophy, not elsewhere classified, other site: Secondary | ICD-10-CM | POA: Diagnosis not present

## 2021-08-10 DIAGNOSIS — R531 Weakness: Secondary | ICD-10-CM | POA: Diagnosis not present

## 2021-08-10 DIAGNOSIS — J432 Centrilobular emphysema: Secondary | ICD-10-CM | POA: Diagnosis not present

## 2021-08-10 DIAGNOSIS — N1832 Chronic kidney disease, stage 3b: Secondary | ICD-10-CM | POA: Diagnosis not present

## 2021-08-10 DIAGNOSIS — R0600 Dyspnea, unspecified: Secondary | ICD-10-CM

## 2021-08-10 DIAGNOSIS — K254 Chronic or unspecified gastric ulcer with hemorrhage: Secondary | ICD-10-CM | POA: Diagnosis not present

## 2021-08-10 DIAGNOSIS — Z9842 Cataract extraction status, left eye: Secondary | ICD-10-CM

## 2021-08-10 DIAGNOSIS — R197 Diarrhea, unspecified: Secondary | ICD-10-CM | POA: Diagnosis not present

## 2021-08-10 DIAGNOSIS — I2510 Atherosclerotic heart disease of native coronary artery without angina pectoris: Secondary | ICD-10-CM | POA: Diagnosis not present

## 2021-08-10 DIAGNOSIS — R0902 Hypoxemia: Secondary | ICD-10-CM | POA: Diagnosis not present

## 2021-08-10 DIAGNOSIS — K7689 Other specified diseases of liver: Secondary | ICD-10-CM | POA: Diagnosis not present

## 2021-08-10 DIAGNOSIS — K2970 Gastritis, unspecified, without bleeding: Secondary | ICD-10-CM | POA: Diagnosis not present

## 2021-08-10 DIAGNOSIS — Z888 Allergy status to other drugs, medicaments and biological substances status: Secondary | ICD-10-CM

## 2021-08-10 LAB — COMPREHENSIVE METABOLIC PANEL
ALT: 13 U/L (ref 0–44)
AST: 26 U/L (ref 15–41)
Albumin: 3.6 g/dL (ref 3.5–5.0)
Alkaline Phosphatase: 74 U/L (ref 38–126)
Anion gap: 20 — ABNORMAL HIGH (ref 5–15)
BUN: 43 mg/dL — ABNORMAL HIGH (ref 8–23)
CO2: 18 mmol/L — ABNORMAL LOW (ref 22–32)
Calcium: 8.3 mg/dL — ABNORMAL LOW (ref 8.9–10.3)
Chloride: 100 mmol/L (ref 98–111)
Creatinine, Ser: 2.22 mg/dL — ABNORMAL HIGH (ref 0.44–1.00)
GFR, Estimated: 22 mL/min — ABNORMAL LOW (ref >60)
Glucose, Bld: 107 mg/dL — ABNORMAL HIGH (ref 70–99)
Potassium: 3.5 mmol/L (ref 3.5–5.1)
Sodium: 138 mmol/L (ref 135–145)
Total Bilirubin: 0.7 mg/dL (ref 0.3–1.2)
Total Protein: 7.2 g/dL (ref 6.5–8.1)

## 2021-08-10 LAB — CBC
HCT: 39.6 % (ref 36.0–46.0)
Hemoglobin: 13.7 g/dL (ref 12.0–15.0)
MCH: 33.6 pg (ref 26.0–34.0)
MCHC: 34.6 g/dL (ref 30.0–36.0)
MCV: 97.1 fL (ref 80.0–100.0)
Platelets: 177 10*3/uL (ref 150–400)
RBC: 4.08 MIL/uL (ref 3.87–5.11)
RDW: 12.1 % (ref 11.5–15.5)
WBC: 4.4 10*3/uL (ref 4.0–10.5)
nRBC: 0 % (ref 0.0–0.2)

## 2021-08-10 NOTE — ED Notes (Signed)
Occult result given to PA Quincy Carnes because meter is not accepting manual entries

## 2021-08-10 NOTE — ED Notes (Signed)
The pt has been having dark stools since Tuesday  no previous history no blood thinners  c/o a headache only

## 2021-08-10 NOTE — ED Provider Notes (Signed)
[facility] EMERGENCY DEPARTMENT Provider Note   CSN: [id number] Arrival date & time: 08/10/21  2221     History  Chief Complaint  Patient presents with   GI Bleeding    Tina Waller is a 81 y.o. female.  The history is provided by the patient and medical records.   81 y.o. F with hx of HTN,  depression, GERD, anemia, prior stroke, presenting to the ED for black stools since Tuesday.  States she has not been seeing any frank blood, stools just look black like coal.  She denies abdominal pain or diarrhea.  No vomiting.  No fever/chills.  She does report hx of GI bleeding in the remote past.  She is followed by GI, Dr. Loletha Carrow.  Has had prior colonoscopies, polyps removed but otherwise normal.  She is currently taking plavix.  Denies recent heavy NSAID use.  Does report she is starting to feel weak, especially with standing but no syncope or LOC.  Home Medications Prior to Admission medications   Medication Sig Start Date End Date Taking? Authorizing Provider  amLODipine (NORVASC) 10 MG tablet TAKE 1 TABLET EVERY DAY 08/08/21   [name], MD  pravastatin (PRAVACHOL) 80 MG tablet TAKE 1 TABLET EVERY DAY FOR HIGH CHOLESTEROL 08/08/21   [name], MD  acetaminophen (TYLENOL) 500 MG tablet Take 500-1,000 mg by mouth every 6 (six) hours as needed for moderate pain or headache.    [provider]  cholecalciferol (VITAMIN D3) 25 MCG (1000 UNIT) tablet Take 2 tablets (2,000 Units total) by mouth daily. Patient not taking: Reported on 11/22/2020 11/14/20   [name], MD  clopidogrel (PLAVIX) 75 MG tablet TAKE 1 TABLET (75 MG TOTAL) BY MOUTH DAILY. 08/06/21   [name], MD  furosemide (LASIX) 40 MG tablet TAKE 1 TABLET (40 MG TOTAL) BY MOUTH 2 (TWO) TIMES DAILY. 08/06/21   [name], MD  isosorbide mononitrate (IMDUR) 60 MG 24 hr tablet Take 1.5 tablets (90 mg total) by mouth daily. 04/19/21   [name], MD  losartan  (COZAAR) 100 MG tablet TAKE 1 TABLET (100 MG TOTAL) BY MOUTH DAILY. 08/06/21   [name], MD  metoprolol succinate (TOPROL-XL) 50 MG 24 hr tablet TAKE 2 TABLETS EVERY DAY 05/07/21   [name], MD  mometasone (ELOCON) 0.1 % ointment Apply topically daily. 07/26/20   Argentina Donovan, PA-C  polyethylene glycol [facility]) packet Take 17 g by mouth daily as needed for mild constipation. 09/15/18   [place], Lenis Dickinson, MD  traZODone (DESYREL) 100 MG tablet Take 1 tablet (100 mg total) by mouth at bedtime as needed for sleep. 01/18/21   [name], MD  triamcinolone cream (KENALOG) 0.1 % Apply 1 application topically 2 (two) times daily as needed (rash). 09/15/18   Hongalgi, Lenis Dickinson, MD  vitamin B-12 (CYANOCOBALAMIN) 1000 MCG tablet Take 1,000 mcg by mouth daily.    [provider]      Allergies    Lisinopril, Carvedilol, Hydralazine, and Lipitor [atorvastatin]    Review of Systems   Review of Systems  Gastrointestinal:  Positive for blood in stool.  All other systems reviewed and are negative.  Physical Exam Updated Vital Signs BP (!) 176/71 (BP Location: Left Arm)    Pulse 96    Temp 99.7 F (37.6 C) (Oral)    Resp 17    SpO2 99%   Physical Exam Vitals and nursing note reviewed.  Constitutional:  Appearance: She is well-developed.  HENT:     Head: Normocephalic and atraumatic.  Eyes:     Conjunctiva/sclera: Conjunctivae normal.     Pupils: Pupils are equal, round, and reactive to light.  Cardiovascular:     Rate and Rhythm: Normal rate and regular rhythm.     Heart sounds: Normal heart sounds.  Pulmonary:     Effort: Pulmonary effort is normal.     Breath sounds: Normal breath sounds.  Abdominal:     General: Bowel sounds are normal.     Palpations: Abdomen is soft.     Tenderness: There is no abdominal tenderness.     Comments: Soft, non-tender  Genitourinary:    Comments: Exam chaperoned by triage RN Owens Shark stool noted on DRE with flecks of  bright red blood noted Musculoskeletal:        General: Normal range of motion.     Cervical back: Normal range of motion.  Skin:    General: Skin is warm and dry.  Neurological:     Mental Status: She is alert and oriented to person, place, and time.    ED Results / Procedures / Treatments   Labs (all labs ordered are listed, but only abnormal results are displayed) Labs Reviewed  RESP PANEL BY RT-PCR (FLU A&B, COVID) ARPGX2 - Abnormal; Notable for the following components:      Result Value   SARS Coronavirus 2 by RT PCR POSITIVE (*)    All other components within normal limits  COMPREHENSIVE METABOLIC PANEL - Abnormal; Notable for the following components:   CO2 18 (*)    Glucose, Bld 107 (*)    BUN 43 (*)    Creatinine, Ser 2.22 (*)    Calcium 8.3 (*)    GFR, Estimated 22 (*)    Anion gap 20 (*)    All other components within normal limits  CBC  POC OCCULT BLOOD, ED  TYPE AND SCREEN  ABO/RH    EKG None  Radiology No results found.  Procedures Procedures    Medications Ordered in ED Medications - No data to display  ED Course/ Medical Decision Making/ A&P                           Medical Decision Making Amount and/or Complexity of Data Reviewed External Data Reviewed: labs, radiology and notes. Labs: ordered. Radiology: ordered. ECG/medicine tests: ordered and independent interpretation performed.  Risk Prescription drug management. Decision regarding hospitalization.   CRITICAL CARE Performed by: Larene Pickett   Total critical care time: 45 minutes  Critical care time was exclusive of separately billable procedures and treating other patients.  Critical care was necessary to treat or prevent imminent or life-threatening deterioration.  Critical care was time spent personally by me on the following activities: development of treatment plan with patient and/or surrogate as well as nursing, discussions with consultants, evaluation of patient's  response to treatment, examination of patient, obtaining history from patient or surrogate, ordering and performing treatments and interventions, ordering and review of laboratory studies, ordering and review of radiographic studies, pulse oximetry and re-evaluation of patient's condition.  81 year old female presenting to the ED with several days of black stools.  States now she is starting to feel weak but has not had any syncope or loss of consciousness.  She is currently on Plavix.  Remote history of GI bleeding in the past, has had prior colonoscopy with polypectomy.  She is followed  by Dr. Loletha Carrow.  She is afebrile nontoxic in appearance here.  She denies any current abdominal pain, abdomen is soft and nontender.  DRE performed, does have dark brown stool with some frank blood noted.  Labs are overall reassuring with stable hemoglobin at 13.7.   SrCr slightly above baseline, will give small fluid bolus and IV Protonix.  Her covid screen has come back positive.  Does report a cough for a few days. Will obtain CXR.  She will require admission.  Discussed with hospitalist, will admit for ongoing care.  Secure message sent to patient's GI team for AM consultation.  Final Clinical Impression(s) / ED Diagnoses Final diagnoses:  Acute GI bleeding    Rx / DC Orders ED Discharge Orders     None         Larene Pickett, PA-C 08/11/21 0506    Drenda Freeze, MD 08/11/21 [phone]

## 2021-08-10 NOTE — ED Triage Notes (Signed)
Pt c/o "coal" black stools since Tuesday, associated abd pain. Endorses weakness, HA. On plavix for hx stroke. Denies vomiting, states distant hx of dark stools, colonoscopy showed polyp- since removed.

## 2021-08-11 ENCOUNTER — Emergency Department (HOSPITAL_COMMUNITY): Payer: Medicare HMO

## 2021-08-11 ENCOUNTER — Other Ambulatory Visit: Payer: Self-pay

## 2021-08-11 ENCOUNTER — Encounter (HOSPITAL_COMMUNITY): Payer: Self-pay | Admitting: Internal Medicine

## 2021-08-11 DIAGNOSIS — N183 Chronic kidney disease, stage 3 unspecified: Secondary | ICD-10-CM

## 2021-08-11 DIAGNOSIS — N179 Acute kidney failure, unspecified: Secondary | ICD-10-CM

## 2021-08-11 DIAGNOSIS — I10 Essential (primary) hypertension: Secondary | ICD-10-CM | POA: Diagnosis not present

## 2021-08-11 DIAGNOSIS — K922 Gastrointestinal hemorrhage, unspecified: Secondary | ICD-10-CM | POA: Diagnosis not present

## 2021-08-11 DIAGNOSIS — Z8673 Personal history of transient ischemic attack (TIA), and cerebral infarction without residual deficits: Secondary | ICD-10-CM

## 2021-08-11 DIAGNOSIS — D696 Thrombocytopenia, unspecified: Secondary | ICD-10-CM | POA: Diagnosis not present

## 2021-08-11 DIAGNOSIS — U071 COVID-19: Secondary | ICD-10-CM | POA: Diagnosis not present

## 2021-08-11 DIAGNOSIS — R059 Cough, unspecified: Secondary | ICD-10-CM | POA: Diagnosis not present

## 2021-08-11 DIAGNOSIS — G4700 Insomnia, unspecified: Secondary | ICD-10-CM

## 2021-08-11 LAB — BASIC METABOLIC PANEL
Anion gap: 9 (ref 5–15)
BUN: 38 mg/dL — ABNORMAL HIGH (ref 8–23)
CO2: 20 mmol/L — ABNORMAL LOW (ref 22–32)
Calcium: 7.8 mg/dL — ABNORMAL LOW (ref 8.9–10.3)
Chloride: 108 mmol/L (ref 98–111)
Creatinine, Ser: 1.83 mg/dL — ABNORMAL HIGH (ref 0.44–1.00)
GFR, Estimated: 28 mL/min — ABNORMAL LOW (ref >60)
Glucose, Bld: 93 mg/dL (ref 70–99)
Potassium: 3.4 mmol/L — ABNORMAL LOW (ref 3.5–5.1)
Sodium: 137 mmol/L (ref 135–145)

## 2021-08-11 LAB — CBC
HCT: 32.8 % — ABNORMAL LOW (ref 36.0–46.0)
Hemoglobin: 11.4 g/dL — ABNORMAL LOW (ref 12.0–15.0)
MCH: 33.4 pg (ref 26.0–34.0)
MCHC: 34.8 g/dL (ref 30.0–36.0)
MCV: 96.2 fL (ref 80.0–100.0)
Platelets: 149 10*3/uL — ABNORMAL LOW (ref 150–400)
RBC: 3.41 MIL/uL — ABNORMAL LOW (ref 3.87–5.11)
RDW: 12.1 % (ref 11.5–15.5)
WBC: 3.7 10*3/uL — ABNORMAL LOW (ref 4.0–10.5)
nRBC: 0 % (ref 0.0–0.2)

## 2021-08-11 LAB — TYPE AND SCREEN
ABO/RH(D): A POS
Antibody Screen: NEGATIVE

## 2021-08-11 LAB — RESP PANEL BY RT-PCR (FLU A&B, COVID) ARPGX2
Influenza A by PCR: NEGATIVE
Influenza B by PCR: NEGATIVE
SARS Coronavirus 2 by RT PCR: POSITIVE — AB

## 2021-08-11 LAB — HEMOGLOBIN AND HEMATOCRIT, BLOOD
HCT: 34.2 % — ABNORMAL LOW (ref 36.0–46.0)
Hemoglobin: 11.7 g/dL — ABNORMAL LOW (ref 12.0–15.0)

## 2021-08-11 LAB — ABO/RH: ABO/RH(D): A POS

## 2021-08-11 MED ORDER — METOPROLOL SUCCINATE ER 100 MG PO TB24
100.0000 mg | ORAL_TABLET | Freq: Every day | ORAL | Status: DC
  Administered 2021-08-11 – 2021-08-21 (×11): 100 mg via ORAL
  Filled 2021-08-11 (×12): qty 1

## 2021-08-11 MED ORDER — PANTOPRAZOLE 80MG IVPB - SIMPLE MED
80.0000 mg | Freq: Once | INTRAVENOUS | Status: AC
Start: 2021-08-11 — End: 2021-08-11
  Administered 2021-08-11: 80 mg via INTRAVENOUS
  Filled 2021-08-11: qty 80

## 2021-08-11 MED ORDER — PRAVASTATIN SODIUM 40 MG PO TABS
80.0000 mg | ORAL_TABLET | Freq: Every day | ORAL | Status: DC
  Administered 2021-08-11 – 2021-08-21 (×10): 80 mg via ORAL
  Filled 2021-08-11 (×11): qty 2

## 2021-08-11 MED ORDER — SODIUM CHLORIDE 0.9% FLUSH
3.0000 mL | Freq: Two times a day (BID) | INTRAVENOUS | Status: DC
  Administered 2021-08-11 – 2021-08-21 (×18): 3 mL via INTRAVENOUS

## 2021-08-11 MED ORDER — TRAZODONE HCL 100 MG PO TABS
100.0000 mg | ORAL_TABLET | Freq: Every evening | ORAL | Status: DC | PRN
  Administered 2021-08-11 – 2021-08-20 (×7): 100 mg via ORAL
  Filled 2021-08-11 (×8): qty 1

## 2021-08-11 MED ORDER — PANTOPRAZOLE SODIUM 40 MG IV SOLR: 40.0000 mg | Freq: Two times a day (BID) | INTRAVENOUS | Status: DC

## 2021-08-11 MED ORDER — PANTOPRAZOLE INFUSION (NEW) - SIMPLE MED
8.0000 mg/h | INTRAVENOUS | Status: DC
  Administered 2021-08-11: 8 mg/h via INTRAVENOUS
  Filled 2021-08-11: qty 100
  Filled 2021-08-11: qty 80

## 2021-08-11 MED ORDER — ACETAMINOPHEN 325 MG PO TABS
650.0000 mg | ORAL_TABLET | Freq: Four times a day (QID) | ORAL | Status: DC | PRN
  Administered 2021-08-12 – 2021-08-21 (×7): 650 mg via ORAL
  Filled 2021-08-11 (×6): qty 2

## 2021-08-11 MED ORDER — AMLODIPINE BESYLATE 10 MG PO TABS
10.0000 mg | ORAL_TABLET | Freq: Every day | ORAL | Status: DC
Start: 2021-08-11 — End: 2021-08-22
  Administered 2021-08-11 – 2021-08-21 (×11): 10 mg via ORAL
  Filled 2021-08-11 (×11): qty 1

## 2021-08-11 MED ORDER — ACETAMINOPHEN 650 MG RE SUPP: 650.0000 mg | Freq: Four times a day (QID) | RECTAL | Status: DC | PRN

## 2021-08-11 MED ORDER — ISOSORBIDE MONONITRATE ER 60 MG PO TB24
90.0000 mg | ORAL_TABLET | Freq: Every day | ORAL | Status: DC
  Administered 2021-08-11 – 2021-08-21 (×11): 90 mg via ORAL
  Filled 2021-08-11 (×11): qty 1

## 2021-08-11 MED ORDER — PANTOPRAZOLE SODIUM 40 MG IV SOLR
40.0000 mg | Freq: Two times a day (BID) | INTRAVENOUS | Status: DC
  Administered 2021-08-11 – 2021-08-12 (×3): 40 mg via INTRAVENOUS
  Filled 2021-08-11 (×3): qty 40

## 2021-08-11 MED ORDER — SODIUM CHLORIDE 0.9 % IV SOLN: INTRAVENOUS | Status: DC

## 2021-08-11 MED ORDER — SODIUM CHLORIDE 0.9 % IV BOLUS
500.0000 mL | Freq: Once | INTRAVENOUS | Status: AC
  Administered 2021-08-11: 500 mL via INTRAVENOUS

## 2021-08-11 NOTE — H&P (Signed)
History and Physical   Tina Waller:[id number] DOB: 10/31/1940 DOA: 08/10/2021  PCP: [name], MD   Patient coming from: Home  Chief Complaint: Black stools, weakness  HPI: Tina Waller is a 81 y.o. female with medical history significant of venous stasis, pulmonary hypertension, hyperparathyroidism, insomnia, anxiety, depression, gout, hypertension, stroke, CKD 3, diabetes presenting with black stools.  Patient reports for the last 4 days she has noted black stools.  Says it appears black glycol, has not noticed any frank blood.  Denies any abdominal pain or diarrhea.  Does report that she has loose stools in the morning but not frequent bowel movements. Denies significant NSAID use.  States she is taking Plavix due to history of stroke.  She does have a remote history of GI bleeding.  She also reports some weakness and cough.  She denies fevers, chills, chest pain, shortness of breath, abdominal pain, constipation, diarrhea, nausea, vomiting.  ED Course: Vital signs in the ED significant for blood pressure in the 938B systolic.  Lab work-up showed CMP with creatinine elevated to 2.22 from previous of 1.73 months ago and 1.41-year ago.  BUN 43 with bicarb of 18 and gap of 20.  Calcium 8.3.  CBC within normal limits.  FOBT positive.  Respiratory panel for flu and COVID positive for COVID.  Patient was typed and screened in the ED.  Chest x-ray has been ordered in the ED but not yet performed.  Patient received IV PPI and a 500 cc bolus.  ED provider states they are sending a message to the GI group that she is followed within the past for patient to be seen in the morning.  Review of Systems: As per HPI otherwise all other systems reviewed and are negative.  Past Medical History:  Diagnosis Date   Anemia    Anxiety    Cataract    Depression    Diabetes mellitus without complication ([place])    pt denies   GERD (gastroesophageal reflux disease)    Gout    Headache     Hypertension    Stroke [facility]) 2008   09/14/2018   Tonsillar mass    Type 2 diabetes mellitus ([place]) 09/14/2018   Vertigo    Wears dentures    upper   Wears glasses     Past Surgical History:  Procedure Laterality Date   ABDOMINAL HYSTERECTOMY     BREAST SURGERY     lumpectomy   BUNIONECTOMY     CATARACT EXTRACTION Left    COLONOSCOPY W/ BIOPSIES AND POLYPECTOMY     MULTIPLE TOOTH EXTRACTIONS     TONGUE BIOPSY Right 12/13/2018   Procedure: RIGHT TONSIL BIOPSY;  Surgeon: Melida Quitter, MD;  [facility] OR;  Service: ENT;  Laterality: Right;    Social History  reports that she quit smoking about 15 years ago. Her smoking use included cigarettes. She has never used smokeless tobacco. She reports current alcohol use of about 4.0 standard drinks per week. She reports that she does not use drugs.  Allergies  Allergen Reactions   Lisinopril Cough   Carvedilol Diarrhea   Hydralazine     Vomiting and diarrhea   Lipitor [Atorvastatin] Diarrhea    Family History  Problem Relation Age of Onset   Heart disease Mother        CHF   Osteoporosis Neg Hx    Colon cancer Neg Hx    Stomach cancer Neg Hx    Pancreatic cancer Neg Hx  Reviewed on admission  Prior to Admission medications   Medication Sig Start Date End Date Taking? Authorizing Provider  acetaminophen (TYLENOL) 500 MG tablet Take 500-1,000 mg by mouth every 6 (six) hours as needed for moderate pain or headache.   Yes [provider]  amLODipine (NORVASC) 10 MG tablet TAKE 1 TABLET EVERY DAY Patient taking differently: Take 10 mg by mouth daily. 08/08/21  Yes [name], MD  cholecalciferol (VITAMIN D3) 25 MCG (1000 UNIT) tablet Take 2 tablets (2,000 Units total) by mouth daily. 11/14/20  Yes [name], MD  furosemide (LASIX) 40 MG tablet TAKE 1 TABLET (40 MG TOTAL) BY MOUTH 2 (TWO) TIMES DAILY. 08/06/21  Yes [name], MD  isosorbide mononitrate (IMDUR) 60 MG 24 hr tablet Take 1.5 tablets (90  mg total) by mouth daily. 04/19/21  Yes [name], MD  losartan (COZAAR) 100 MG tablet TAKE 1 TABLET (100 MG TOTAL) BY MOUTH DAILY. 08/06/21  Yes [name], MD  metoprolol succinate (TOPROL-XL) 50 MG 24 hr tablet TAKE 2 TABLETS EVERY DAY Patient taking differently: Take 50 mg by mouth 2 (two) times daily. 05/07/21  Yes [name], MD  mometasone (ELOCON) 0.1 % ointment Apply topically daily. 07/26/20  Yes Freeman Caldron M, PA-C  polyethylene glycol [facility]) packet Take 17 g by mouth daily as needed for mild constipation. 09/15/18  Yes Hongalgi, Lenis Dickinson, MD  pravastatin (PRAVACHOL) 80 MG tablet TAKE 1 TABLET EVERY DAY FOR HIGH CHOLESTEROL Patient taking differently: Take 80 mg by mouth daily. 08/08/21  Yes [name], MD  traZODone (DESYREL) 100 MG tablet Take 1 tablet (100 mg total) by mouth at bedtime as needed for sleep. 01/18/21  Yes [name], MD  triamcinolone cream (KENALOG) 0.1 % Apply 1 application topically 2 (two) times daily as needed (rash). 09/15/18  Yes Hongalgi, Lenis Dickinson, MD  vitamin B-12 (CYANOCOBALAMIN) 1000 MCG tablet Take 1,000 mcg by mouth daily.   Yes [provider]  clopidogrel (PLAVIX) 75 MG tablet TAKE 1 TABLET (75 MG TOTAL) BY MOUTH DAILY. Patient not taking: Reported on 08/11/2021 08/06/21   [name], MD    Physical Exam: Vitals:   08/10/21 2245 08/10/21 2300 08/10/21 2330 08/11/21 0026  BP: (!) 161/80 (!) 144/102 (!) 146/57   Pulse: 93 89 87   Resp:   18 18  Temp:    99.1 F (37.3 C)  TempSrc:      SpO2: 95% 94% 96%    Physical Exam Constitutional:      General: She is not in acute distress.    Appearance: Normal appearance.  HENT:     Head: Normocephalic and atraumatic.     Mouth/Throat:     Mouth: Mucous membranes are moist.     Pharynx: Oropharynx is clear.  Eyes:     Extraocular Movements: Extraocular movements intact.     Pupils: Pupils are equal, round, and reactive to light.  Cardiovascular:      Rate and Rhythm: Normal rate and regular rhythm.     Pulses: Normal pulses.     Heart sounds: Normal heart sounds.  Pulmonary:     Effort: Pulmonary effort is normal. No respiratory distress.     Breath sounds: Normal breath sounds.  Abdominal:     General: Bowel sounds are normal. There is no distension.     Palpations: Abdomen is soft.     Tenderness: There is no abdominal tenderness.  Musculoskeletal:  General: No swelling or deformity.  Skin:    General: Skin is warm and dry.  Neurological:     General: No focal deficit present.     Mental Status: Mental status is at baseline.   Labs on Admission: I have personally reviewed following labs and imaging studies  CBC: Recent Labs  Lab 08/10/21 2310  WBC 4.4  HGB 13.7  HCT 39.6  MCV 97.1  PLT 967    Basic Metabolic Panel: Recent Labs  Lab 08/10/21 2310  NA 138  K 3.5  CL 100  CO2 18*  GLUCOSE 107*  BUN 43*  CREATININE 2.22*  CALCIUM 8.3*    GFR: CrCl cannot be calculated (Unknown ideal weight.).  Liver Function Tests: Recent Labs  Lab 08/10/21 2310  AST 26  ALT 13  ALKPHOS 74  BILITOT 0.7  PROT 7.2  ALBUMIN 3.6    Urine analysis:    Component Value Date/Time   COLORURINE STRAW (A) 09/29/2018 1243   APPEARANCEUR CLEAR 09/29/2018 1243   APPEARANCEUR Clear 09/24/2018 1228   LABSPEC 1.004 (L) 09/29/2018 1243   PHURINE 7.0 09/29/2018 1243   GLUCOSEU NEGATIVE 09/29/2018 1243   HGBUR SMALL (A) 09/29/2018 1243   BILIRUBINUR negative 07/26/2020 1503   BILIRUBINUR Negative 09/24/2018 1228   KETONESUR negative 07/26/2020 1503   KETONESUR NEGATIVE 09/29/2018 1243   PROTEINUR 100 (A) 09/29/2018 1243   UROBILINOGEN 0.2 07/26/2020 1503   UROBILINOGEN 0.2 02/01/2007 2102   NITRITE Negative 07/26/2020 1503   NITRITE NEGATIVE 09/29/2018 1243   LEUKOCYTESUR Trace (A) 07/26/2020 1503   LEUKOCYTESUR NEGATIVE 09/29/2018 1243    Radiological Exams on Admission: No results found.  EKG: Not  performed in the ED.  Assessment/Plan Principal Problem:   GI bleed Active Problems:   History of CVA (cerebrovascular accident)   HTN (hypertension)   Insomnia   COVID-19 virus infection   Acute renal failure superimposed on stage 3 chronic kidney disease (HCC)  GI bleed > Patient presenting with 4 days of black stools which were heme positive in ED.  Hemoglobin is stable. > No abdominal pain, no diarrhea.  Is on Plavix not taking significant NSAIDs. > Does report some weakness but incidentally was found to have COVID as below which is alternative etiology. > Does report remote history of GI bleed. > EDP states they are sending message to GI to see patient in the morning. - Appreciate GI recommendations - Monitor on telemetry - Continue with IV PPI - N.p.o. except ice chips and sips with meds - Hold home Plavix  AKI on CKD 3 > Creatinine elevated to 2.22 from a baseline of 1.73 months ago and 1.41-year ago. > BUN elevated to 43 (could also be elevated from GI bleeding) bicarb 18 with a gap of 20. > Received 500 cc in the ED. - We will continue with IV fluids overnight and monitor response. - Hold home losartan and Lasix - Avoid nephrotoxic agents - Trend renal function and electrolytes  COVID-19 infection > Incidentally found to be positive for COVID-19.  Does report some weakness and cough recently. > Chest x-ray has been ordered in the ED but is pending. - Correct limit not recommended at patient's current GFR given AKI - Continue to monitor  Hypertension - Continue home amlodipine, metoprolol, Imdur - Holding Lasix and losartan in the setting of AKI as above  History of CVA - Continue home pravastatin - Holding Plavix in the setting of GI bleed  Insomnia - Continue home trazodone as  needed  DVT prophylaxis: SCDs  Code Status:   Full  Family Communication:  Attempted to contact son, Lennette Bihari, by phone; but there was no answer, she is not sure if he is aware that  she is in the hospital.  She states that her neighbor does know that she is in the hospital. Disposition Plan:   Patient is from:  Home  Anticipated DC to:  Home  Anticipated DC date:  1 to 3 days  Anticipated DC barriers: None  Consults called:  Message sent to GI for consult in the morning by EDP Admission status:  Observation, telemetry   Severity of Illness: The appropriate patient status for this patient is OBSERVATION. Observation status is judged to be reasonable and necessary in order to provide the required intensity of service to ensure the patient's safety. The patient's presenting symptoms, physical exam findings, and initial radiographic and laboratory data in the context of their medical condition is felt to place them at decreased risk for further clinical deterioration. Furthermore, it is anticipated that the patient will be medically stable for discharge from the hospital within 2 midnights of admission.    Marcelyn Bruins MD [facility]  How to contact the [facility] Attending or Consulting provider [place] or covering provider during after hours [place], for this patient?   Check the care team in [facility] and look for a) attending/consulting [facility] provider listed and b) the [facility] team listed Log into [email or web address] and use [place]'s universal password to access. If you do not have the password, please contact the hospital operator. Locate the [facility] provider you are looking for under [facility] and page to a number that you can be directly reached. If you still have difficulty reaching the provider, please page the [facility] (Director on Call) for the Hospitalists listed on amion for assistance.  08/11/2021, 12:36 AM

## 2021-08-11 NOTE — Consult Note (Addendum)
Referring Provider: Quincy Carnes, PA-C Primary Care Physician:  [name], MD Primary Gastroenterologist:  Dr. Loletha Carrow  Reason for Consultation:  GI bleed  HPI: Tina Waller is a 81 y.o. female with medical history significant of venous stasis, pulmonary hypertension, hyperparathyroidism, insomnia, anxiety, depression, gout, hypertension, stroke (on Plavix), CKD 3, diabetes presenting with black stools.  She says that since Tuesday she has been having very black stools, one episode per day each day, usually occurring in the morning.  Also noted red blood upon wiping.  No abdominal pain (she says that it is just sore from coughing lately).  No NSAID use.  Reports no issues with heartburn/reflux at home and does not take any medication for such issues.  Hgb normal/at baseline on admission but this AM down to 11.4 grams.  No BM since admission.  Colonoscopy 09/2016:  - Decreased sphincter tone found on digital rectal exam. - One 4 mm polyp in the mid ascending colon, removed with a cold snare. Resected and retrieved.  Tubular adenoma. - Diverticulosis in the entire examined colon. - The examination was otherwise normal on direct and retroflexion views (given the limitation of the poor preparation).   Past Medical History:  Diagnosis Date   Anemia    Anxiety    Cataract    Depression    Diabetes mellitus without complication ([place])    pt denies   GERD (gastroesophageal reflux disease)    Gout    Headache    Hypertension    Stroke [facility]) 2008   09/14/2018   Tonsillar mass    Type 2 diabetes mellitus ([place]) 09/14/2018   Vertigo    Wears dentures    upper   Wears glasses     Past Surgical History:  Procedure Laterality Date   ABDOMINAL HYSTERECTOMY     BREAST SURGERY     lumpectomy   BUNIONECTOMY     CATARACT EXTRACTION Left    COLONOSCOPY W/ BIOPSIES AND POLYPECTOMY     MULTIPLE TOOTH EXTRACTIONS     TONGUE BIOPSY Right 12/13/2018   Procedure: RIGHT TONSIL BIOPSY;   Surgeon: Melida Quitter, MD;  Location: [facility];  Service: ENT;  Laterality: Right;    Prior to Admission medications   Medication Sig Start Date End Date Taking? Authorizing Provider  acetaminophen (TYLENOL) 500 MG tablet Take 500-1,000 mg by mouth every 6 (six) hours as needed for moderate pain or headache.   Yes [provider]  amLODipine (NORVASC) 10 MG tablet TAKE 1 TABLET EVERY DAY Patient taking differently: Take 10 mg by mouth daily. 08/08/21  Yes [name], MD  cholecalciferol (VITAMIN D3) 25 MCG (1000 UNIT) tablet Take 2 tablets (2,000 Units total) by mouth daily. 11/14/20  Yes [name], MD  furosemide (LASIX) 40 MG tablet TAKE 1 TABLET (40 MG TOTAL) BY MOUTH 2 (TWO) TIMES DAILY. 08/06/21  Yes [name], MD  isosorbide mononitrate (IMDUR) 60 MG 24 hr tablet Take 1.5 tablets (90 mg total) by mouth daily. 04/19/21  Yes [name], MD  losartan (COZAAR) 100 MG tablet TAKE 1 TABLET (100 MG TOTAL) BY MOUTH DAILY. 08/06/21  Yes [name], MD  metoprolol succinate (TOPROL-XL) 50 MG 24 hr tablet TAKE 2 TABLETS EVERY DAY Patient taking differently: Take 50 mg by mouth 2 (two) times daily. 05/07/21  Yes [name], MD  mometasone (ELOCON) 0.1 % ointment Apply topically daily. 07/26/20  Yes McClung, Levada Dy M, PA-C  polyethylene glycol ([place])  packet Take 17 g by mouth daily as needed for mild constipation. 09/15/18  Yes Hongalgi, Lenis Dickinson, MD  pravastatin (PRAVACHOL) 80 MG tablet TAKE 1 TABLET EVERY DAY FOR HIGH CHOLESTEROL Patient taking differently: Take 80 mg by mouth daily. 08/08/21  Yes [name], MD  traZODone (DESYREL) 100 MG tablet Take 1 tablet (100 mg total) by mouth at bedtime as needed for sleep. 01/18/21  Yes [name], MD  triamcinolone cream (KENALOG) 0.1 % Apply 1 application topically 2 (two) times daily as needed (rash). 09/15/18  Yes Hongalgi, Lenis Dickinson, MD  vitamin B-12 (CYANOCOBALAMIN) 1000 MCG tablet Take 1,000  mcg by mouth daily.   Yes [provider]  clopidogrel (PLAVIX) 75 MG tablet TAKE 1 TABLET (75 MG TOTAL) BY MOUTH DAILY. Patient not taking: Reported on 08/11/2021 08/06/21   [name], MD    Current Facility-Administered Medications  Medication Dose Route Frequency Provider Last Rate Last Admin   0.9 %  sodium chloride infusion   Intravenous Continuous Marcelyn Bruins, MD 100 mL/hr at 08/11/21 0110 New Bag at 08/11/21 0110   acetaminophen (TYLENOL) tablet 650 mg  650 mg Oral Q6H PRN Marcelyn Bruins, MD       Or   acetaminophen (TYLENOL) suppository 650 mg  650 mg Rectal Q6H PRN Marcelyn Bruins, MD       amLODipine (NORVASC) tablet 10 mg  10 mg Oral Daily Marcelyn Bruins, MD       isosorbide mononitrate (IMDUR) 24 hr tablet 90 mg  90 mg Oral Daily Marcelyn Bruins, MD       metoprolol succinate (TOPROL-XL) 24 hr tablet 100 mg  100 mg Oral Daily Marcelyn Bruins, MD       [START ON 08/14/2021] pantoprazole (PROTONIX) injection 40 mg  40 mg Intravenous Q12H Marcelyn Bruins, MD       pantoprozole (PROTONIX) 80 mg /NS 100 mL infusion  8 mg/hr Intravenous Continuous Marcelyn Bruins, MD 10 mL/hr at 08/11/21 0104 8 mg/hr at 08/11/21 0104   pravastatin (PRAVACHOL) tablet 80 mg  80 mg Oral Daily Marcelyn Bruins, MD       sodium chloride flush (NS) 0.9 % injection 3 mL  3 mL Intravenous Q12H Marcelyn Bruins, MD   3 mL at 08/11/21 0420   traZODone (DESYREL) tablet 100 mg  100 mg Oral QHS PRN Marcelyn Bruins, MD        Allergies as of 08/10/2021 - Review Complete 08/10/2021  Allergen Reaction Noted   Lisinopril Cough 05/20/2018   Carvedilol Diarrhea 08/16/2020   Hydralazine  11/22/2020   Lipitor [atorvastatin] Diarrhea 12/22/2016    Family History  Problem Relation Age of Onset   Heart disease Mother        CHF   Osteoporosis Neg Hx    Colon cancer Neg Hx    Stomach cancer Neg Hx    Pancreatic cancer Neg Hx     Social History    Socioeconomic History   Marital status: Divorced    Spouse name: Not on file   Number of children: Not on file   Years of education: Not on file   Highest education level: Not on file  Occupational History   Not on file  Tobacco Use   Smoking status: Former    Types: Cigarettes    Quit date: 07/14/2006    Years since quitting: 15.0   Smokeless tobacco: Never  Vaping Use   Vaping  Use: Never used  Substance and Sexual Activity   Alcohol use: Yes    Alcohol/week: 4.0 standard drinks    Types: 4 Shots of liquor per week    Comment: 4 drinks a week   Drug use: No   Sexual activity: Not Currently  Other Topics Concern   Not on file  Social History Narrative   Not on file   Social Determinants of Health   Financial Resource Strain: Not on file  Food Insecurity: Not on file  Transportation Needs: Not on file  Physical Activity: Not on file  Stress: Not on file  Social Connections: Not on file  Intimate Partner Violence: Not on file    Review of Systems: ROS is O/W negative except as mentioned in HPI.  Physical Exam: Vital signs in last 24 hours: Temp:  [99.1 F (37.3 C)-99.7 F (37.6 C)] 99.2 F (37.3 C) (01/29 0359) Pulse Rate:  [78-96] 88 (01/29 0355) Resp:  [16-19] 18 (01/29 0355) BP: (136-176)/(57-102) 163/69 (01/29 0355) SpO2:  [94 %-100 %] 98 % (01/29 0355) Weight:  [58 kg] 58 kg (01/29 0359)   General:  Alert, Well-developed, well-nourished, pleasant and cooperative in NAD Head:  Normocephalic and atraumatic. Eyes:  Sclera clear, no icterus.  Conjunctiva pink. Ears:  Normal auditory acuity. Mouth:  No deformity or lesions.   Lungs:  Clear throughout to auscultation.  No wheezes, crackles, or rhonchi.  Heart:  Regular rate and rhythm; no murmurs, clicks, rubs, or gallops. Abdomen:  Soft, non-distended.  BS present.  Non-tender.  Msk:  Symmetrical without gross deformities. Pulses:  Normal pulses noted. Extremities:  Without clubbing or  edema. Neurologic:  Alert and oriented x 4;  grossly normal neurologically. Skin:  Intact without significant lesions or rashes. Psych:  Alert and cooperative. Normal mood and affect.  Lab Results: Recent Labs    08/10/21 2310 08/11/21 0413  WBC 4.4 3.7*  HGB 13.7 11.4*  HCT 39.6 32.8*  PLT 177 149*   BMET Recent Labs    08/10/21 2310 08/11/21 0413  NA 138 137  K 3.5 3.4*  CL 100 108  CO2 18* 20*  GLUCOSE 107* 93  BUN 43* 38*  CREATININE 2.22* 1.83*  CALCIUM 8.3* 7.8*   LFT Recent Labs    08/10/21 2310  PROT 7.2  ALBUMIN 3.6  AST 26  ALT 13  ALKPHOS 74  BILITOT 0.7   Studies/Results: DG Chest Port 1 View  Result Date: 08/11/2021 CLINICAL DATA:  COVID-19 positivity with cough, initial encounter EXAM: PORTABLE CHEST 1 VIEW COMPARISON:  10/19/2007 FINDINGS: Cardiac shadow is within normal limits. A aortic calcifications are again seen and stable. The lungs are well aerated bilaterally. No focal infiltrate or sizable effusion is seen. No bony abnormality is noted. IMPRESSION: No acute abnormality seen. Electronically Signed   By: Inez Catalina M.D.   On: 08/11/2021 00:42    IMPRESSION:  *Suspected GI with 4-5 days of black stools:  Hgb normal/at baseline on admission but this AM down to 11.4 grams, which is likely at least in part dilutional.  "Brown stool noted on DRE with flecks of bright red blood noted" per the ED note.  Not BM since admission. *Antiplatelet use with Plavix:  Last dose 1/28 AM. *Covid +  PLAN: -? EGD at some point.  Timing TBD due to Plavix use. -Is on PPI gtt, which I have discontinued and placed on IV BID. -Trend Hgb.   Laban Emperor. Luree Palla  08/11/2021, 9:28 AM

## 2021-08-11 NOTE — Progress Notes (Signed)
Patient seen and examined.  Poor historian overall.  Admitted early morning hours by nighttime hospitalist.  See H&P assessment plan.  GI following. In brief, 81 year old with history of pulmonary hypertension, hyperparathyroidism, insomnia, anxiety, hypertension, CKD stage IIIb, history of stroke on Plavix presented to the ER with black stools without any nausea vomiting or abdominal pain.  In the emergency room she was found to have acute kidney injury, initial hemoglobin was stable but subsequently dropped by 2 points.  Suspect upper GI bleeding: Patient also on Plavix.  Currently on Protonix.  NPO.  Anticipate EGD.  GI following.  Hemoglobin every 12 hours.  Maintenance IV fluids.  Acute kidney injury: Already stabilizing on maintenance IV fluids.  Continue.  COVID-19 positivity: Started mild symptoms 6 days ago.  Symptomatic treatment.  No indication for antiviral therapy today.   Same-day admission.  No charge visit. I offered patient to call her family for updates, she declined.

## 2021-08-11 NOTE — Plan of Care (Signed)
Pt admitted for GI bleed. BP (!) 163/69 (BP Location: Left Arm)    Pulse 88    Temp 99.2 F (37.3 C) (Oral)    Resp 18    Ht 5\' 9"  (1.753 m)    Wt 58 kg    SpO2 98%    BMI 18.88 kg/m  Pt on tele NSR, Pt has NS at  100...., Pt oriented to floor. Bed in lowest position, bed alarm on, 2/4 rails up and call bell within reach. No pain voiced or any  other needs at this time. Louanne Skye 08/11/21 4:30 AM

## 2021-08-12 ENCOUNTER — Observation Stay (HOSPITAL_COMMUNITY): Payer: Medicare HMO

## 2021-08-12 ENCOUNTER — Inpatient Hospital Stay (HOSPITAL_COMMUNITY): Payer: Medicare HMO

## 2021-08-12 DIAGNOSIS — N179 Acute kidney failure, unspecified: Secondary | ICD-10-CM | POA: Diagnosis not present

## 2021-08-12 DIAGNOSIS — D490 Neoplasm of unspecified behavior of digestive system: Secondary | ICD-10-CM | POA: Diagnosis not present

## 2021-08-12 DIAGNOSIS — F419 Anxiety disorder, unspecified: Secondary | ICD-10-CM | POA: Diagnosis present

## 2021-08-12 DIAGNOSIS — M109 Gout, unspecified: Secondary | ICD-10-CM | POA: Diagnosis present

## 2021-08-12 DIAGNOSIS — R059 Cough, unspecified: Secondary | ICD-10-CM | POA: Diagnosis not present

## 2021-08-12 DIAGNOSIS — K922 Gastrointestinal hemorrhage, unspecified: Secondary | ICD-10-CM | POA: Diagnosis present

## 2021-08-12 DIAGNOSIS — M6289 Other specified disorders of muscle: Secondary | ICD-10-CM | POA: Diagnosis not present

## 2021-08-12 DIAGNOSIS — J439 Emphysema, unspecified: Secondary | ICD-10-CM | POA: Diagnosis not present

## 2021-08-12 DIAGNOSIS — C20 Malignant neoplasm of rectum: Secondary | ICD-10-CM | POA: Diagnosis not present

## 2021-08-12 DIAGNOSIS — D62 Acute posthemorrhagic anemia: Secondary | ICD-10-CM | POA: Diagnosis not present

## 2021-08-12 DIAGNOSIS — Z8673 Personal history of transient ischemic attack (TIA), and cerebral infarction without residual deficits: Secondary | ICD-10-CM | POA: Diagnosis not present

## 2021-08-12 DIAGNOSIS — K769 Liver disease, unspecified: Secondary | ICD-10-CM | POA: Diagnosis not present

## 2021-08-12 DIAGNOSIS — N183 Chronic kidney disease, stage 3 unspecified: Secondary | ICD-10-CM | POA: Diagnosis not present

## 2021-08-12 DIAGNOSIS — R509 Fever, unspecified: Secondary | ICD-10-CM | POA: Diagnosis not present

## 2021-08-12 DIAGNOSIS — M419 Scoliosis, unspecified: Secondary | ICD-10-CM | POA: Diagnosis not present

## 2021-08-12 DIAGNOSIS — Z681 Body mass index (BMI) 19 or less, adult: Secondary | ICD-10-CM | POA: Diagnosis not present

## 2021-08-12 DIAGNOSIS — I129 Hypertensive chronic kidney disease with stage 1 through stage 4 chronic kidney disease, or unspecified chronic kidney disease: Secondary | ICD-10-CM | POA: Diagnosis not present

## 2021-08-12 DIAGNOSIS — E119 Type 2 diabetes mellitus without complications: Secondary | ICD-10-CM | POA: Diagnosis not present

## 2021-08-12 DIAGNOSIS — J432 Centrilobular emphysema: Secondary | ICD-10-CM | POA: Diagnosis not present

## 2021-08-12 DIAGNOSIS — I2510 Atherosclerotic heart disease of native coronary artery without angina pectoris: Secondary | ICD-10-CM | POA: Diagnosis not present

## 2021-08-12 DIAGNOSIS — E1122 Type 2 diabetes mellitus with diabetic chronic kidney disease: Secondary | ICD-10-CM | POA: Diagnosis not present

## 2021-08-12 DIAGNOSIS — T45525A Adverse effect of antithrombotic drugs, initial encounter: Secondary | ICD-10-CM | POA: Diagnosis present

## 2021-08-12 DIAGNOSIS — E213 Hyperparathyroidism, unspecified: Secondary | ICD-10-CM | POA: Diagnosis present

## 2021-08-12 DIAGNOSIS — Z90710 Acquired absence of both cervix and uterus: Secondary | ICD-10-CM | POA: Diagnosis not present

## 2021-08-12 DIAGNOSIS — J449 Chronic obstructive pulmonary disease, unspecified: Secondary | ICD-10-CM | POA: Diagnosis not present

## 2021-08-12 DIAGNOSIS — K219 Gastro-esophageal reflux disease without esophagitis: Secondary | ICD-10-CM | POA: Diagnosis present

## 2021-08-12 DIAGNOSIS — U071 COVID-19: Secondary | ICD-10-CM | POA: Diagnosis not present

## 2021-08-12 DIAGNOSIS — G4700 Insomnia, unspecified: Secondary | ICD-10-CM | POA: Diagnosis not present

## 2021-08-12 DIAGNOSIS — I272 Pulmonary hypertension, unspecified: Secondary | ICD-10-CM | POA: Diagnosis not present

## 2021-08-12 DIAGNOSIS — D492 Neoplasm of unspecified behavior of bone, soft tissue, and skin: Secondary | ICD-10-CM | POA: Diagnosis not present

## 2021-08-12 DIAGNOSIS — D696 Thrombocytopenia, unspecified: Secondary | ICD-10-CM | POA: Diagnosis not present

## 2021-08-12 DIAGNOSIS — J90 Pleural effusion, not elsewhere classified: Secondary | ICD-10-CM | POA: Diagnosis not present

## 2021-08-12 DIAGNOSIS — N1832 Chronic kidney disease, stage 3b: Secondary | ICD-10-CM | POA: Diagnosis not present

## 2021-08-12 DIAGNOSIS — K4020 Bilateral inguinal hernia, without obstruction or gangrene, not specified as recurrent: Secondary | ICD-10-CM | POA: Diagnosis not present

## 2021-08-12 DIAGNOSIS — D631 Anemia in chronic kidney disease: Secondary | ICD-10-CM | POA: Diagnosis present

## 2021-08-12 DIAGNOSIS — N8189 Other female genital prolapse: Secondary | ICD-10-CM | POA: Diagnosis not present

## 2021-08-12 DIAGNOSIS — K2970 Gastritis, unspecified, without bleeding: Secondary | ICD-10-CM | POA: Diagnosis not present

## 2021-08-12 DIAGNOSIS — I10 Essential (primary) hypertension: Secondary | ICD-10-CM | POA: Diagnosis not present

## 2021-08-12 DIAGNOSIS — K635 Polyp of colon: Secondary | ICD-10-CM | POA: Diagnosis not present

## 2021-08-12 DIAGNOSIS — K31811 Angiodysplasia of stomach and duodenum with bleeding: Secondary | ICD-10-CM | POA: Diagnosis not present

## 2021-08-12 DIAGNOSIS — K259 Gastric ulcer, unspecified as acute or chronic, without hemorrhage or perforation: Secondary | ICD-10-CM | POA: Diagnosis not present

## 2021-08-12 DIAGNOSIS — K7689 Other specified diseases of liver: Secondary | ICD-10-CM | POA: Diagnosis not present

## 2021-08-12 DIAGNOSIS — E43 Unspecified severe protein-calorie malnutrition: Secondary | ICD-10-CM | POA: Diagnosis not present

## 2021-08-12 DIAGNOSIS — Y92009 Unspecified place in unspecified non-institutional (private) residence as the place of occurrence of the external cause: Secondary | ICD-10-CM | POA: Diagnosis not present

## 2021-08-12 DIAGNOSIS — J9811 Atelectasis: Secondary | ICD-10-CM | POA: Diagnosis not present

## 2021-08-12 DIAGNOSIS — N200 Calculus of kidney: Secondary | ICD-10-CM | POA: Diagnosis not present

## 2021-08-12 DIAGNOSIS — K6289 Other specified diseases of anus and rectum: Secondary | ICD-10-CM | POA: Diagnosis not present

## 2021-08-12 DIAGNOSIS — K551 Chronic vascular disorders of intestine: Secondary | ICD-10-CM | POA: Diagnosis not present

## 2021-08-12 DIAGNOSIS — K222 Esophageal obstruction: Secondary | ICD-10-CM | POA: Diagnosis not present

## 2021-08-12 DIAGNOSIS — F32A Depression, unspecified: Secondary | ICD-10-CM | POA: Diagnosis not present

## 2021-08-12 DIAGNOSIS — K254 Chronic or unspecified gastric ulcer with hemorrhage: Secondary | ICD-10-CM | POA: Diagnosis not present

## 2021-08-12 DIAGNOSIS — N281 Cyst of kidney, acquired: Secondary | ICD-10-CM | POA: Diagnosis not present

## 2021-08-12 LAB — RETICULOCYTES
Immature Retic Fract: 19.1 % — ABNORMAL HIGH (ref 2.3–15.9)
RBC.: 3.24 MIL/uL — ABNORMAL LOW (ref 3.87–5.11)
Retic Count, Absolute: 48.9 10*3/uL (ref 19.0–186.0)
Retic Ct Pct: 1.5 % (ref 0.4–3.1)

## 2021-08-12 LAB — IRON AND TIBC
Iron: 23 ug/dL — ABNORMAL LOW (ref 28–170)
Saturation Ratios: 11 % (ref 10.4–31.8)
TIBC: 203 ug/dL — ABNORMAL LOW (ref 250–450)
UIBC: 180 ug/dL

## 2021-08-12 LAB — VITAMIN B12: Vitamin B-12: 4102 pg/mL — ABNORMAL HIGH (ref 180–914)

## 2021-08-12 LAB — FOLATE: Folate: 17.1 ng/mL (ref >5.9)

## 2021-08-12 LAB — HEMOGLOBIN AND HEMATOCRIT, BLOOD
HCT: 31.4 % — ABNORMAL LOW (ref 36.0–46.0)
Hemoglobin: 10.8 g/dL — ABNORMAL LOW (ref 12.0–15.0)

## 2021-08-12 LAB — FERRITIN: Ferritin: 200 ng/mL (ref 11–307)

## 2021-08-12 MED ORDER — ENSURE ENLIVE PO LIQD
237.0000 mL | Freq: Three times a day (TID) | ORAL | Status: DC
  Administered 2021-08-12 – 2021-08-21 (×11): 237 mL via ORAL

## 2021-08-12 MED ORDER — PANTOPRAZOLE SODIUM 40 MG PO TBEC
40.0000 mg | DELAYED_RELEASE_TABLET | Freq: Two times a day (BID) | ORAL | Status: DC
  Administered 2021-08-12 – 2021-08-21 (×17): 40 mg via ORAL
  Filled 2021-08-12 (×18): qty 1

## 2021-08-12 MED ORDER — ALBUTEROL SULFATE HFA 108 (90 BASE) MCG/ACT IN AERS
2.0000 | INHALATION_SPRAY | RESPIRATORY_TRACT | Status: DC | PRN
  Filled 2021-08-12: qty 6.7

## 2021-08-12 MED ORDER — PEG-KCL-NACL-NASULF-NA ASC-C 100 G PO SOLR: 1.0000 | Freq: Once | ORAL | Status: DC

## 2021-08-12 MED ORDER — PEG-KCL-NACL-NASULF-NA ASC-C 100 G PO SOLR: 0.5000 | Freq: Once | ORAL | Status: DC

## 2021-08-12 MED ORDER — PEG-KCL-NACL-NASULF-NA ASC-C 100 G PO SOLR
0.5000 | Freq: Once | ORAL | Status: DC
  Filled 2021-08-12: qty 1

## 2021-08-12 MED ORDER — ADULT MULTIVITAMIN W/MINERALS CH
1.0000 | ORAL_TABLET | Freq: Every day | ORAL | Status: DC
  Administered 2021-08-12 – 2021-08-21 (×10): 1 via ORAL
  Filled 2021-08-12 (×10): qty 1

## 2021-08-12 MED ORDER — METOCLOPRAMIDE HCL 5 MG/ML IJ SOLN: 10.0000 mg | Freq: Four times a day (QID) | INTRAMUSCULAR | Status: DC

## 2021-08-12 MED ORDER — SODIUM CHLORIDE 0.9 % IV SOLN: INTRAVENOUS | Status: DC

## 2021-08-12 MED ORDER — BISACODYL 5 MG PO TBEC
10.0000 mg | DELAYED_RELEASE_TABLET | Freq: Once | ORAL | Status: DC
  Filled 2021-08-12: qty 2

## 2021-08-12 NOTE — Evaluation (Signed)
Physical Therapy Evaluation Patient Details Name: Tina Waller MRN: [id number] DOB: [birth year] Today's Date: 08/12/2021  History of Present Illness  81 year old presented to the ER with black stools without any nausea vomiting or abdominal pain.  In the emergency room she was found to have acute kidney injury, initial hemoglobin was stable but subsequently dropped by 2 points, covid +, initial symptoms around 1/23; with history of pulmonary hypertension, hyperparathyroidism, insomnia, anxiety, hypertension, CKD stage IIIb, history of stroke on Plavix  Clinical Impression   Pt admitted with above diagnosis. Lives alone in a single level apartment with one step to enter; Modified independent at baseline, uses cane for ambulation (has RW); Presents to PT with generalized weakness and fatigue, effecting independence and safety with mobility; Needs Mod assist to power up from low surfaces like low toilet and bed at lowest position, min assist for in room amb; Pt currently with functional limitations due to the deficits listed below (see PT Problem List). Pt will benefit from skilled PT to increase their independence and safety with mobility to allow discharge to the venue listed below.          Recommendations for follow up therapy are one component of a multi-disciplinary discharge planning process, led by the attending physician.  Recommendations may be updated based on patient status, additional functional criteria and insurance authorization.  Follow Up Recommendations Home health PT    Assistance Recommended at Discharge PRN  Patient can return home with the following  Assistance with cooking/housework    Equipment Recommendations BSC/3in1  Recommendations for Other Services  Other (comment) (Will consider OT consult as pt lives alone)    Functional Status Assessment Patient has had a recent decline in their functional status and demonstrates the ability to make significant  improvements in function in a reasonable and predictable amount of time.     Precautions / Restrictions Precautions Precautions: Fall Precaution Comments: Air/Con Covid Restrictions Weight Bearing Restrictions: No      Mobility  Bed Mobility                    Transfers Overall transfer level: Needs assistance Equipment used: 1 person hand held assist Transfers: Sit to/from Stand Sit to Stand: Mod assist           General transfer comment: Light mod assist to power up from recliner and from low toilet    Ambulation/Gait Ambulation/Gait assistance: Min assist Gait Distance (Feet): 40 Feet Assistive device: 1 person hand held assist Gait Pattern/deviations: Step-through pattern Gait velocity: slowed     General Gait Details: Cues to self-monitor for activity tolerance  Stairs            Wheelchair Mobility    Modified Rankin (Stroke Patients Only)       Balance Overall balance assessment: Needs assistance   Sitting balance-Leahy Scale: Good       Standing balance-Leahy Scale: Fair                               Pertinent Vitals/Pain Pain Assessment Pain Assessment: Faces Faces Pain Scale: Hurts little more Pain Location: Abdomen; soreness from coughing a lot Pain Descriptors / Indicators: Aching, Sore Pain Intervention(s): Monitored during session    Home Living Family/patient expects to be discharged to:: Private residence Living Arrangements: Alone Available Help at Discharge: Neighbor;Family;Available PRN/intermittently Type of Home: Apartment Home Access: Stairs to enter   [employer] of Steps:  1   Home Layout: One level Home Equipment: Cane - single point;[place]ower [place] (2 wheels) Additional Comments: Pt states, "I'm independent because I have to be"    Prior Function Prior Level of Function : Independent/Modified Independent             Mobility Comments: Consistenly walks with a  cane       Hand Dominance        Extremity/Trunk Assessment   Upper Extremity Assessment Upper Extremity Assessment: Generalized weakness    Lower Extremity Assessment Lower Extremity Assessment: Generalized weakness    Cervical / Trunk Assessment Cervical / Trunk Assessment: Normal  Communication   Communication: No difficulties  Cognition Arousal/Alertness: Awake/alert Behavior During Therapy: WFL for tasks assessed/performed Overall Cognitive Status: Within Functional Limits for tasks assessed                                          General Comments General comments (skin integrity, edema, etc.): No DOE noted with amb in room on room air    Exercises     Assessment/Plan    PT Assessment Patient needs continued PT services  PT Problem List Decreased strength;Decreased activity tolerance;Decreased balance;Decreased mobility;Decreased knowledge of use of DME       PT Treatment Interventions DME instruction;Gait training;Stair training;Functional mobility training;Therapeutic activities;Therapeutic exercise;Balance training;Patient/family education    PT Goals (Current goals can be found in the Care Plan section)  Acute Rehab PT Goals Patient Stated Goal: get stronger PT Goal Formulation: With patient Time For Goal Achievement: 08/26/21 Potential to Achieve Goals: Good    Frequency Min 3X/week     Co-evaluation               AM-PAC PT "6 Clicks" Mobility  Outcome Measure Help needed turning from your back to your side while in a flat bed without using bedrails?: A Little Help needed moving from lying on your back to sitting on the side of a flat bed without using bedrails?: A Little Help needed moving to and from a bed to a chair (including a wheelchair)?: A Little Help needed standing up from a chair using your arms (e.g., wheelchair or bedside chair)?: A Lot Help needed to walk in hospital room?: A Little Help needed climbing 3-5  steps with a railing? : A Lot 6 Click Score: 16    End of Session   Activity Tolerance: Patient limited by fatigue Patient left: in chair;with call bell/phone within reach;with chair alarm set Nurse Communication: Mobility status PT Visit Diagnosis: Unsteadiness on feet (R26.81);Muscle weakness (generalized) (M62.81)    Time: 2130-8657 PT Time Calculation (min) (ACUTE ONLY): 27 min   Charges:   PT Evaluation $PT Eval Low Complexity: 1 Low PT Treatments $Gait Training: 8-22 mins        Roney [place], [place]  Acute Rehabilitation Services Pager [phone] Office [phone]'   Colletta [place] 08/12/2021, 1:57 PM

## 2021-08-12 NOTE — Care Management Obs Status (Signed)
[place] NOTIFICATION   Patient Details  Name: GLORI MACHNIK MRN: [id number] Date of Birth: 13-May-1941   Medicare Observation Status Notification Given:  Yes    Tom-Johnson, Renea Ee, RN 08/12/2021, 10:42 AM

## 2021-08-12 NOTE — Progress Notes (Signed)
PROGRESS NOTE    Tina Waller  ZOX:[id number] DOB: [birth year] DOA: 08/10/2021 PCP: [name], MD    Brief Narrative:  81 year old with history of pulmonary hypertension, hyperparathyroidism, insomnia, anxiety, hypertension, CKD stage IIIb, history of stroke on Plavix presented to the ER with black stools without any nausea vomiting or abdominal pain.  In the emergency room she was found to have acute kidney injury, initial hemoglobin was stable but subsequently dropped by 2 points.  Patient was also having dry cough since last few days.  COVID-19 was positive.   Assessment & Plan:  Suspect upper GI bleeding: Patient also on Plavix.  Currently on Protonix IV. Followed by gastroenterology.  Currently on full liquid diet.  They plan for upper GI endoscopy as well as colonoscopy after Plavix washout.   Anemia of blood loss: Baseline hemoglobin 13.7.  Hemoglobin 13.7-11.4-10.8.  Currently no evidence of requiring blood transfusion. Patient with significant drop in hemoglobin, will suggest inpatient evaluation with procedures.   Acute kidney injury: Already stabilizing on maintenance IV fluids.  Continue.   COVID-19 positivity: Started mild symptoms 7 days ago.  Symptomatic treatment.  No indication for antiviral therapy today.  Essential hypertension: Blood pressure is stable on metoprolol, amlodipine, Imdur.   DVT prophylaxis: SCDs Start: 08/11/21 0025   Code Status: Full code Family Communication: None.  I offered to call her son, she declined she said she will talk to him. Disposition Plan: Status is: Observation  The patient will require care spanning > 2 midnights and should be moved to inpatient because: Significant drop in hemoglobin, inpatient procedure planned by gastroenterology.        Consultants:  GI  Procedures:  None  Antimicrobials:  None   Subjective: Patient seen and examined.  Has some dry cough.  Low-grade temperature 100.5.  She had 2  episodes of melanotic stool overnight.  Denies any abdominal pain.  Denies any nausea or vomiting.  Objective: Vitals:   08/11/21 1723 08/11/21 2007 08/12/21 0502 08/12/21 1102  BP: 123/66 (!) 141/62 138/69 129/61  Pulse: 88 80 98 85  Resp: 18 18 18 16   Temp: 98.8 F (37.1 C) 99.3 F (37.4 C) (!) 100.5 F (38.1 C) 99.3 F (37.4 C)  TempSrc: Oral Oral Oral Oral  SpO2: 92% 100% 95% 95%  Weight:      Height:        Intake/Output Summary (Last 24 hours) at 08/12/2021 1302 Last data filed at 08/12/2021 0502 Gross per 24 hour  Intake 100 ml  Output --  Net 100 ml   Filed Weights   08/11/21 0359  Weight: 58 kg    Examination:  General exam: Appears calm and comfortable.  On room air. Respiratory system: Clear to auscultation. Respiratory effort normal. Cardiovascular system: S1 & S2 heard, RRR. No JVD, murmurs, rubs, gallops or clicks. No pedal edema. Gastrointestinal system: Soft.  Nontender.  Bowel sound present. Central nervous system: Alert and oriented. No focal neurological deficits.   Data Reviewed: I have personally reviewed following labs and imaging studies  CBC: Recent Labs  Lab 08/10/21 2310 08/11/21 0413 08/11/21 1659 08/12/21 0620  WBC 4.4 3.7*  --   --   HGB 13.7 11.4* 11.7* 10.8*  HCT 39.6 32.8* 34.2* 31.4*  MCV 97.1 96.2  --   --   PLT 177 149*  --   --    Basic Metabolic Panel: Recent Labs  Lab 08/10/21 2310 08/11/21 0413  NA 138 137  K 3.5 3.4*  CL 100 108  CO2 18* 20*  GLUCOSE 107* 93  BUN 43* 38*  CREATININE 2.22* 1.83*  CALCIUM 8.3* 7.8*   GFR: Estimated Creatinine Clearance: 23.2 mL/min (A) (by C-G formula based on SCr of 1.83 mg/dL (H)). Liver Function Tests: Recent Labs  Lab 08/10/21 2310  AST 26  ALT 13  ALKPHOS 74  BILITOT 0.7  PROT 7.2  ALBUMIN 3.6   No results for input(s): LIPASE, AMYLASE in the last 168 hours. No results for input(s): AMMONIA in the last 168 hours. Coagulation Profile: No results for input(s):  INR, PROTIME in the last 168 hours. Cardiac Enzymes: No results for input(s): CKTOTAL, CKMB, CKMBINDEX, TROPONINI in the last 168 hours. BNP (last 3 results) No results for input(s): PROBNP in the last 8760 hours. HbA1C: No results for input(s): HGBA1C in the last 72 hours. CBG: No results for input(s): GLUCAP in the last 168 hours. Lipid Profile: No results for input(s): CHOL, HDL, LDLCALC, TRIG, CHOLHDL, LDLDIRECT in the last 72 hours. Thyroid Function Tests: No results for input(s): TSH, T4TOTAL, FREET4, T3FREE, THYROIDAB in the last 72 hours. Anemia Panel: Recent Labs    08/12/21 0620  VITAMINB12 4,102*  FOLATE 17.1  FERRITIN 200  TIBC 203*  IRON 23*  RETICCTPCT 1.5   Sepsis Labs: No results for input(s): PROCALCITON, LATICACIDVEN in the last 168 hours.  Recent Results (from the past 240 hour(s))  Resp Panel by RT-PCR (Flu A&B, Covid) Nasopharyngeal Swab     Status: Abnormal   Collection Time: 08/10/21 10:38 PM   Specimen: Nasopharyngeal Swab; Nasopharyngeal(NP) swabs in vial transport medium  Result Value Ref Range Status   SARS Coronavirus 2 by RT PCR POSITIVE (A) NEGATIVE Final    Comment: (NOTE) SARS-CoV-2 target nucleic acids are DETECTED.  The SARS-CoV-2 RNA is generally detectable in upper respiratory specimens during the acute phase of infection. Positive results are indicative of the presence of the identified virus, but do not rule out bacterial infection or co-infection with other pathogens not detected by the test. Clinical correlation with patient history and other diagnostic information is necessary to determine patient infection status. The expected result is Negative.  Fact Sheet for Patients: EntrepreneurPulse.com.au  Fact Sheet for Healthcare Providers: IncredibleEmployment.be  This test is not yet approved or cleared by the [place] FDA and  has been authorized for detection and/or diagnosis of  SARS-CoV-2 by FDA under an Emergency Use Authorization (EUA).  This EUA will remain in effect (meaning this test can be used) for the duration of  the COVID-19 declaration under Section 564(b)(1) of the A ct, 21 U.S.C. section 360bbb-3(b)(1), unless the authorization is terminated or revoked sooner.     Influenza A by PCR NEGATIVE NEGATIVE Final   Influenza B by PCR NEGATIVE NEGATIVE Final    Comment: (NOTE) The Xpert Xpress SARS-CoV-2/FLU/RSV plus assay is intended as an aid in the diagnosis of influenza from Nasopharyngeal swab specimens and should not be used as a sole basis for treatment. Nasal washings and aspirates are unacceptable for Xpert Xpress SARS-CoV-2/FLU/RSV testing.  Fact Sheet for Patients: EntrepreneurPulse.com.au  Fact Sheet for Healthcare Providers: IncredibleEmployment.be  This test is not yet approved or cleared by the [place] FDA and has been authorized for detection and/or diagnosis of SARS-CoV-2 by FDA under an Emergency Use Authorization (EUA). This EUA will remain in effect (meaning this test can be used) for the duration of the COVID-19 declaration under Section 564(b)(1) of the Act, 21 U.S.C. section 360bbb-3(b)(1), unless the  authorization is terminated or revoked.  Performed at [facility], [place] [address], [place],  [address]          Radiology Studies: US Abdomen Complete  Result Date: 08/12/2021 CLINICAL DATA:  Thrombocytopenia. EXAM: ABDOMEN ULTRASOUND COMPLETE COMPARISON:  Renal ultrasound 09/04/2017 FINDINGS: Gallbladder: No gallstones or gallbladder wall thickening. No pericholecystic fluid. The sonographer reports no sonographic Murphy's sign. Common bile duct: Diameter: 4 mm Liver: Tiny cyst noted right liver. Portal vein is patent on color Doppler imaging with normal direction of blood flow towards the liver. IVC: No abnormality visualized. Pancreas: Partially obscured by bowel  gas. Spleen: Size and appearance within normal limits. 7.4 cm craniocaudal length. Right Kidney: Length: 9.5 cm 4-5 mm nonobstructing stone evident. 2.3 cm simple cyst. No hydronephrosis. Echogenicity diffusely increased. Left Kidney: Length: 9.9 cm. Diffusely increased echotexture. 2 cysts identified measuring up to 2.1 cm diameter. No hydronephrosis. Abdominal aorta: No aneurysm visualized. Other findings: None. IMPRESSION: 1. No evidence of splenomegaly. 2. Increased echotexture of renal parenchyma, a nonspecific finding which can be seen in medical renal disease. 3. 4-5 mm nonobstructing stone in the lower pole right kidney. Electronically Signed   By: Misty Stanley M.D.   On: 08/12/2021 07:48   DG Chest Port 1 View  Result Date: 08/11/2021 CLINICAL DATA:  COVID-19 positivity with cough, initial encounter EXAM: PORTABLE CHEST 1 VIEW COMPARISON:  10/19/2007 FINDINGS: Cardiac shadow is within normal limits. A aortic calcifications are again seen and stable. The lungs are well aerated bilaterally. No focal infiltrate or sizable effusion is seen. No bony abnormality is noted. IMPRESSION: No acute abnormality seen. Electronically Signed   By: Inez Catalina M.D.   On: 08/11/2021 00:42        Scheduled Meds:  amLODipine  10 mg Oral Daily   isosorbide mononitrate  90 mg Oral Daily   metoprolol succinate  100 mg Oral Daily   pantoprazole (PROTONIX) IV  40 mg Intravenous Q12H   pravastatin  80 mg Oral Daily   sodium chloride flush  3 mL Intravenous Q12H   Continuous Infusions:   LOS: 0 days    Time spent: 35 minutes    Barb Merino, MD [facility] Pager [phone]

## 2021-08-12 NOTE — Progress Notes (Addendum)
Daily Rounding Note  08/12/2021, 2:35 PM  LOS: 0 days   SUBJECTIVE:   Chief complaint: Anemia, FOBT negative.  Diarrhea.  No further nausea or vomiting.  Tolerating full liquids but hungry.  Stools are soft, unformed, dark brown without obvious blood.  Denies abdominal pain. Denies significant shortness of breath and cough.  Does not feel like her breathing is compromised.   OBJECTIVE:         Vital signs in last 24 hours:    Temp:  [98.8 F (37.1 C)-100.5 F (38.1 C)] 99.3 F (37.4 C) (01/30 1102) Pulse Rate:  [80-98] 85 (01/30 1102) Resp:  [16-18] 16 (01/30 1102) BP: (123-141)/(61-69) 129/61 (01/30 1102) SpO2:  [92 %-100 %] 95 % (01/30 1102) Last BM Date: 08/10/21 Filed Weights   08/11/21 0359  Weight: 58 kg   General: Pleasant, comfortable.  Elderly.  Looks a bit frail. Heart: RRR. Chest: Decreased breath sounds bilaterally but no dyspnea or cough Abdomen: Soft, not tender.  No distention.  Active bowel sounds. Extremities: No CCE. Neuro/Psych: Oriented x3, pleasant, cooperative.  Intake/Output from previous day: 01/29 0701 - 01/30 0700 In: 340 [P.O.:240; I.V.:100] Out: -   Intake/Output this shift: No intake/output data recorded.  Lab Results: Recent Labs    08/10/21 2310 08/11/21 0413 08/11/21 1659 08/12/21 0620  WBC 4.4 3.7*  --   --   HGB 13.7 11.4* 11.7* 10.8*  HCT 39.6 32.8* 34.2* 31.4*  PLT 177 149*  --   --    BMET Recent Labs    08/10/21 2310 08/11/21 0413  NA 138 137  K 3.5 3.4*  CL 100 108  CO2 18* 20*  GLUCOSE 107* 93  BUN 43* 38*  CREATININE 2.22* 1.83*  CALCIUM 8.3* 7.8*   LFT Recent Labs    08/10/21 2310  PROT 7.2  ALBUMIN 3.6  AST 26  ALT 13  ALKPHOS 74  BILITOT 0.7   PT/INR No results for input(s): LABPROT, INR in the last 72 hours. Hepatitis Panel No results for input(s): HEPBSAG, HCVAB, HEPAIGM, HEPBIGM in the last 72 hours.  Studies/Results: US  Abdomen Complete  Result Date: 08/12/2021 CLINICAL DATA:  Thrombocytopenia. EXAM: ABDOMEN ULTRASOUND COMPLETE COMPARISON:  Renal ultrasound 09/04/2017 FINDINGS: Gallbladder: No gallstones or gallbladder wall thickening. No pericholecystic fluid. The sonographer reports no sonographic Murphy's sign. Common bile duct: Diameter: 4 mm Liver: Tiny cyst noted right liver. Portal vein is patent on color Doppler imaging with normal direction of blood flow towards the liver. IVC: No abnormality visualized. Pancreas: Partially obscured by bowel gas. Spleen: Size and appearance within normal limits. 7.4 cm craniocaudal length. Right Kidney: Length: 9.5 cm 4-5 mm nonobstructing stone evident. 2.3 cm simple cyst. No hydronephrosis. Echogenicity diffusely increased. Left Kidney: Length: 9.9 cm. Diffusely increased echotexture. 2 cysts identified measuring up to 2.1 cm diameter. No hydronephrosis. Abdominal aorta: No aneurysm visualized. Other findings: None. IMPRESSION: 1. No evidence of splenomegaly. 2. Increased echotexture of renal parenchyma, a nonspecific finding which can be seen in medical renal disease. 3. 4-5 mm nonobstructing stone in the lower pole right kidney. Electronically Signed   By: Misty Stanley M.D.   On: 08/12/2021 07:48   DG Chest Port 1 View  Result Date: 08/11/2021 CLINICAL DATA:  COVID-19 positivity with cough, initial encounter EXAM: PORTABLE CHEST 1 VIEW COMPARISON:  10/19/2007 FINDINGS: Cardiac shadow is within normal limits. A aortic calcifications are again seen and stable. The lungs are well aerated bilaterally.  No focal infiltrate or sizable effusion is seen. No bony abnormality is noted. IMPRESSION: No acute abnormality seen. Electronically Signed   By: Inez Catalina M.D.   On: 08/11/2021 00:42    ASSESMENT:   Melenic stool reported but FOBT negative on lab analysis.  Protonix 40 IV bid in place. 09/2016 colonoscopy limited by poor prep.  A TA polyp was removed, pandiverticulosis  noted. No prior EGD.  Hx GERD, no PPI etc PTA.    Anemia.  No transfusion to date.  Normal MCV.  Hgb 13.7 >> fluids >> 11.4 >> 10.8.    Plavix for history CVA.  Although some notes mention she is not taking this, she is taking this and her last dose was on Friday 1/27.    Diarrhea since last Tuesday, suspect this is manifestation of COVID-19.  Stool studies not sent.  Diarrhea overall improved.     CKD 3.  Possible med renal dz on this AMs ultrasound.       COVID-19 positive.  Mild symptoms.  No indication for antiviral therapy.     AKI.    Thrombocytopenia.  Platelets 149.    Ultrasound this morning shows normal appearing spleen nonspecific increased echo of renal parenchyma, possible medical renal disease as well as nonobstructing right kidney stone.   PLAN     Plan EGD and colonoscopy 2/1.   Allow solids at dinner tonite, start clears tmrw.  See bowel prep orders.  Switch to oral Protonix 40 mg bid.      Azucena Freed  08/12/2021, 2:35 PM Phone [phone]

## 2021-08-12 NOTE — Progress Notes (Signed)
Overnight patient had 2 bowel movements. Stool was black with dark red clots.

## 2021-08-12 NOTE — Progress Notes (Signed)
Discussed case with oncall MD: patient with spO2 85 on R/A . Placed on 4Liters. Temp 101.6, (treated) runny nose, congestion , moderate productive cough , on ausculation sounds rhonchorous, she is not currently on anti virals . Outcome pending

## 2021-08-12 NOTE — Progress Notes (Signed)
Initial Nutrition Assessment  DOCUMENTATION CODES:   Not applicable  INTERVENTION:  -Ensure Enlive po TID, each supplement provides 350 kcal and 20 grams of protein -MVI with minerals daily  NUTRITION DIAGNOSIS:   Inadequate oral intake related to poor appetite as evidenced by meal completion < 50%.  GOAL:   Patient will meet greater than or equal to 90% of their needs  MONITOR:   PO intake, Supplement acceptance, Diet advancement, Labs, I & O's  REASON FOR ASSESSMENT:   Malnutrition Screening Tool    ASSESSMENT:   Pt with PMH significant for HTN, hyperparathyroidism, insomnia, anxiety, CKD stg 3b, h/o stroke admitted with GI bleed and subsequently found to be positive for COVID-19.  Pt unavailable at time of RD visit x2 attempts. RN reports pt ate 0-50% of clear liquid trays yesterday but was unable to say how pt has done since diet was advanced to full liquids this morning. RD to provide oral nutrition supplements and will attempt to obtain diet/weight history and nutrition-focused physical exam upon follow-up. Note GI is following pt and planning for EGD/colonoscopy after Plavix washout.  UOP: 7x unmeasured occurrences x24 hours I/O: +375ml since admit  Medications: protonix Labs:  Recent Labs  Lab 08/10/21 2310 08/11/21 0413  NA 138 137  K 3.5 3.4*  CL 100 108  CO2 18* 20*  BUN 43* 38*  CREATININE 2.22* 1.83*  CALCIUM 8.3* 7.8*  GLUCOSE 107* 93    Diet Order:   Diet Order             Diet full liquid Room service appropriate? Yes; Fluid consistency: Thin  Diet effective now                   EDUCATION NEEDS:   No education needs have been identified at this time  Skin:  Skin Assessment: Reviewed RN Assessment  Last BM:  1/29  Height:   Ht Readings from Last 1 Encounters:  08/11/21 5\' 9"  (1.753 m)    Weight:   Wt Readings from Last 1 Encounters:  08/11/21 58 kg    BMI:  Body mass index is 18.88 kg/m.  Estimated Nutritional  Needs:   Kcal:  1600-1800  Protein:  80-90 grams  Fluid:  >1.6L     Theone Stanley., MS, RD, LDN (she/her/hers) RD pager number and weekend/on-call pager number located in [place].

## 2021-08-12 NOTE — Plan of Care (Signed)
  Problem: Activity: Goal: Risk for activity intolerance will decrease Outcome: Progressing   

## 2021-08-13 ENCOUNTER — Inpatient Hospital Stay (HOSPITAL_COMMUNITY): Payer: Medicare HMO

## 2021-08-13 DIAGNOSIS — N179 Acute kidney failure, unspecified: Secondary | ICD-10-CM | POA: Diagnosis not present

## 2021-08-13 DIAGNOSIS — I10 Essential (primary) hypertension: Secondary | ICD-10-CM | POA: Diagnosis not present

## 2021-08-13 DIAGNOSIS — K922 Gastrointestinal hemorrhage, unspecified: Secondary | ICD-10-CM | POA: Diagnosis not present

## 2021-08-13 DIAGNOSIS — U071 COVID-19: Secondary | ICD-10-CM | POA: Diagnosis not present

## 2021-08-13 LAB — HEMOGLOBIN AND HEMATOCRIT, BLOOD
HCT: 31.3 % — ABNORMAL LOW (ref 36.0–46.0)
Hemoglobin: 10.6 g/dL — ABNORMAL LOW (ref 12.0–15.0)

## 2021-08-13 MED ORDER — PEG-KCL-NACL-NASULF-NA ASC-C 100 G PO SOLR: 0.5000 | Freq: Once | ORAL | Status: DC

## 2021-08-13 MED ORDER — METOCLOPRAMIDE HCL 5 MG/ML IJ SOLN
10.0000 mg | Freq: Once | INTRAMUSCULAR | Status: AC
  Administered 2021-08-13: 10 mg via INTRAVENOUS
  Filled 2021-08-13: qty 2

## 2021-08-13 MED ORDER — PEG-KCL-NACL-NASULF-NA ASC-C 100 G PO SOLR
0.5000 | Freq: Once | ORAL | Status: DC
  Filled 2021-08-13: qty 1

## 2021-08-13 MED ORDER — PEG-KCL-NACL-NASULF-NA ASC-C 100 G PO SOLR
0.5000 | Freq: Once | ORAL | Status: DC
Start: 2021-08-13 — End: 2021-08-13

## 2021-08-13 MED ORDER — BISACODYL 5 MG PO TBEC
10.0000 mg | DELAYED_RELEASE_TABLET | Freq: Once | ORAL | Status: AC
  Administered 2021-08-13: 10 mg via ORAL
  Filled 2021-08-13: qty 2

## 2021-08-13 MED ORDER — PEG-KCL-NACL-NASULF-NA ASC-C 100 G PO SOLR
0.5000 | Freq: Once | ORAL | Status: AC
  Administered 2021-08-13: 100 g via ORAL
  Filled 2021-08-13: qty 1

## 2021-08-13 MED ORDER — METOCLOPRAMIDE HCL 5 MG/ML IJ SOLN
10.0000 mg | Freq: Once | INTRAMUSCULAR | Status: AC
  Administered 2021-08-14: 10 mg via INTRAVENOUS
  Filled 2021-08-13: qty 2

## 2021-08-13 NOTE — Progress Notes (Signed)
°  Progress Note   Patient: Tina Waller GDJ:[id number] DOB: Mar 10, 1941 DOA: 08/10/2021     1 DOS: the patient was seen and examined on 08/13/2021        Brief hospital course: Mrs. Tina Waller is a 81 y.o. F with pHTN, HTN, CKD IIIb, hx stroke on Plavix, and hyperparathyoidism who presented with melena, cough.  In the ER, found to have COVID, also hemoglobin trending down.  Started on PPI and admitted.      Assessment and Plan: Acute gastrointestinal hemorrhage, suspected upper GI bleed Acute blood loss anemia Hgb down to 10 g/dL, now stable, no further melena.   Iron stores normal. -Continue PPI - Consult GI, appreciate cares - Endoscopy planned tomorrow   Acute kidney injury on Chronic kidney disease stage IIIb Baseline Cr 1.4-1.7.  Cr 2.2 on admission, improved to 1.8 today. -Hold further fluids  COVID infection Outside window for Paxlovid (symptoms started >7 days PTA), and improving clinically.  Hypertension Cerebrovascular disease Pulmonary hypertension -Continue metoprolol, amlodipine, Imdur   Hyperparathyroidism         Subjective: No melena overnight.  No confusion, fever.  She has generalized weakness.  No vomiting, diarrhea.  Physical Exam: Vitals:   08/13/21 0001 08/13/21 0204 08/13/21 0556 08/13/21 0757  BP: (!) 128/59 (!) 146/67 139/66 (!) 163/70  Pulse: 79 76 87 88  Resp: 18 17 17 18   Temp: 98.7 F (37.1 C) 98.9 F (37.2 C) 100.3 F (37.9 C) 99.8 F (37.7 C)  TempSrc: Oral Oral Oral Oral  SpO2: 100% 97% 97% 95%  Weight:      Height:       Elderly adult female, sitting up in bed, appears tired. Heart rate regular, rhythm normal, no murmurs, no lower extremity edema Lung sounds clear without rales or wheezes, respiratory effort normal Abdominal with mild diffuse nonfocal tenderness, no rigidity, rebound, or guarding, no ascites or distention Awake and alert, extraocular movements intact, moves all extremities with normal strength  and coordination, speech fluent Attention normal, affect appropriate, judgment Syprine normal       Data Reviewed: My review of labs and imaging is notable for hemoglobin 10.6, stable, iron and B12 replete. Discussed with gastroenterology  Family Communication:   Disposition: Status is: Inpatient Remains inpatient appropriate because: She requires ongoing work-up for acute blood loss anemia in the setting of COVID.  Planned Discharge Destination: Home with Home Health               Author: Edwin Dada, MD 08/13/2021 2:07 PM  For on call review [email or web address].

## 2021-08-13 NOTE — Progress Notes (Signed)
Physical Therapy Treatment Patient Details Name: Tina Waller MRN: [id number] DOB: [birth year] Today's Date: 08/13/2021   History of Present Illness 81 year old presented to the ER with black stools without any nausea vomiting or abdominal pain.  In the emergency room she was found to have acute kidney injury, initial hemoglobin was stable but subsequently dropped by 2 points, covid +, initial symptoms around 1/23; with history of pulmonary hypertension, hyperparathyroidism, insomnia, anxiety, hypertension, CKD stage IIIb, history of stroke on Plavix    PT Comments    Continuing work on functional mobility and activity tolerance;  Agrees to walk even though exhausted after being cleaned up after loose BMs; Less steady today with functional mobility and amb; Requiring Oxygen supplementation as well; Updated recs to consider a short-term SNF stay for post-acute rehab;   For EGD tomorrow   Recommendations for follow up therapy are one component of a multi-disciplinary discharge planning process, led by the attending physician.  Recommendations may be updated based on patient status, additional functional criteria and insurance authorization.  Follow Up Recommendations  Skilled nursing-short term rehab (<3 hours/day)     Assistance Recommended at Discharge    Patient can return home with the following A little help with walking and/or transfers;A little help with bathing/dressing/bathroom;Assistance with cooking/housework   Equipment Recommendations  BSC/3in1 (consider Rollator RW)    Recommendations for Other Services       Precautions / Restrictions Precautions Precautions: Fall Precaution Comments: Air/Con Covid; now on 4 L O2     Mobility  Bed Mobility Overal bed mobility: Needs Assistance Bed Mobility: Supine to Sit, Sit to Supine     Supine to sit: Min assist Sit to supine: Min assist   General bed mobility comments: min hand held assist to pull to full sit     Transfers Overall transfer level: Needs assistance Equipment used: 1 person hand held assist Transfers: Sit to/from Stand Sit to Stand: Min assist, From elevated surface           General transfer comment: Min assist to steady and rise from low bed    Ambulation/Gait Ambulation/Gait assistance: Min assist Gait Distance (Feet): 65 Feet Assistive device: 1 person hand held assist Gait Pattern/deviations: Step-through pattern Gait velocity: slowed     General Gait Details: Cues to self-monitor for activity tolerance; less steady today, needing bil UE support to steady during amb   Stairs             Wheelchair Mobility    Modified Rankin (Stroke Patients Only)       Balance     Sitting balance-Leahy Scale: Good       Standing balance-Leahy Scale: Fair                              Cognition Arousal/Alertness: Awake/alert Behavior During Therapy: WFL for tasks assessed/performed Overall Cognitive Status: Within Functional Limits for tasks assessed                                          Exercises      General Comments General comments (skin integrity, edema, etc.): Walked on 4L supplemental O2 today      Pertinent Vitals/Pain Pain Assessment Pain Assessment: Faces Faces Pain Scale: Hurts little more Pain Location: Abdomen; soreness from coughing a lot Pain Descriptors / Indicators: Aching, Sore  Pain Intervention(s): Monitored during session    Home Living                          Prior Function            PT Goals (current goals can now be found in the care plan section) Acute Rehab PT Goals Patient Stated Goal: get stronger PT Goal Formulation: With patient Time For Goal Achievement: 08/26/21 Potential to Achieve Goals: Good Progress towards PT goals: Progressing toward goals (Slowly)    Frequency    Min 2X/week      PT Plan Discharge plan needs to be updated;Frequency needs to be  updated    Co-evaluation              AM-PAC PT "6 Clicks" Mobility   Outcome Measure  Help needed turning from your back to your side while in a flat bed without using bedrails?: None Help needed moving from lying on your back to sitting on the side of a flat bed without using bedrails?: A Little Help needed moving to and from a bed to a chair (including a wheelchair)?: A Little Help needed standing up from a chair using your arms (e.g., wheelchair or bedside chair)?: A Lot Help needed to walk in hospital room?: A Lot Help needed climbing 3-5 steps with a railing? : A Lot 6 Click Score: 16    End of Session Equipment Utilized During Treatment: Oxygen Activity Tolerance: Patient limited by fatigue Patient left: in bed;with call bell/phone within reach   PT Visit Diagnosis: Unsteadiness on feet (R26.81);Muscle weakness (generalized) (M62.81)     Time: 0109-3235 PT Time Calculation (min) (ACUTE ONLY): 22 min  Charges:  $Gait Training: 8-22 mins                     Roney Marion, [place]  Acute Rehabilitation Services Pager [phone] Office [place] 08/13/2021, 2:16 PM

## 2021-08-13 NOTE — Progress Notes (Signed)
Pt on for colon/EGD at 330 PM tmrw.  See orders.   Hgb 10.6.    S Karn Derk PA-C

## 2021-08-14 DIAGNOSIS — K922 Gastrointestinal hemorrhage, unspecified: Secondary | ICD-10-CM | POA: Diagnosis not present

## 2021-08-14 LAB — CBC
HCT: 32.1 % — ABNORMAL LOW (ref 36.0–46.0)
Hemoglobin: 10.7 g/dL — ABNORMAL LOW (ref 12.0–15.0)
MCH: 33 pg (ref 26.0–34.0)
MCHC: 33.3 g/dL (ref 30.0–36.0)
MCV: 99.1 fL (ref 80.0–100.0)
Platelets: 188 10*3/uL (ref 150–400)
RBC: 3.24 MIL/uL — ABNORMAL LOW (ref 3.87–5.11)
RDW: 12.2 % (ref 11.5–15.5)
WBC: 4.6 10*3/uL (ref 4.0–10.5)
nRBC: 0 % (ref 0.0–0.2)

## 2021-08-14 LAB — COMPREHENSIVE METABOLIC PANEL
ALT: 11 U/L (ref 0–44)
AST: 23 U/L (ref 15–41)
Albumin: 2.6 g/dL — ABNORMAL LOW (ref 3.5–5.0)
Alkaline Phosphatase: 51 U/L (ref 38–126)
Anion gap: 11 (ref 5–15)
BUN: 10 mg/dL (ref 8–23)
CO2: 20 mmol/L — ABNORMAL LOW (ref 22–32)
Calcium: 8.6 mg/dL — ABNORMAL LOW (ref 8.9–10.3)
Chloride: 115 mmol/L — ABNORMAL HIGH (ref 98–111)
Creatinine, Ser: 1.25 mg/dL — ABNORMAL HIGH (ref 0.44–1.00)
GFR, Estimated: 44 mL/min — ABNORMAL LOW (ref >60)
Glucose, Bld: 89 mg/dL (ref 70–99)
Potassium: 3.6 mmol/L (ref 3.5–5.1)
Sodium: 146 mmol/L — ABNORMAL HIGH (ref 135–145)
Total Bilirubin: 0.7 mg/dL (ref 0.3–1.2)
Total Protein: 5.7 g/dL — ABNORMAL LOW (ref 6.5–8.1)

## 2021-08-14 LAB — C-REACTIVE PROTEIN: CRP: 6.8 mg/dL — ABNORMAL HIGH (ref <1.0)

## 2021-08-14 MED ORDER — METOCLOPRAMIDE HCL 5 MG/ML IJ SOLN
10.0000 mg | Freq: Four times a day (QID) | INTRAMUSCULAR | Status: AC
  Administered 2021-08-14 – 2021-08-15 (×2): 10 mg via INTRAVENOUS
  Filled 2021-08-14 (×2): qty 2

## 2021-08-14 MED ORDER — POLYETHYLENE GLYCOL 3350 17 GM/SCOOP PO POWD
1.0000 | Freq: Once | ORAL | Status: AC
Start: 2021-08-14 — End: 2021-08-14
  Administered 2021-08-14: 255 g via ORAL
  Filled 2021-08-14: qty 255

## 2021-08-14 MED ORDER — ONDANSETRON HCL 4 MG/2ML IJ SOLN
4.0000 mg | Freq: Three times a day (TID) | INTRAMUSCULAR | Status: DC
  Administered 2021-08-14 – 2021-08-16 (×5): 4 mg via INTRAVENOUS
  Filled 2021-08-14 (×5): qty 2

## 2021-08-14 NOTE — TOC Initial Note (Signed)
Transition of Care [facility]) - Initial/Assessment Note    Patient Details  Name: Tina Waller MRN: [id number] Date of Birth: 1941-05-20  Transition of Care [facility]) CM/SW Contact:    Milinda Antis, [place] Phone Number: 08/14/2021, 10:27 AM  Clinical Narrative:                 CSW received consult for possible SNF placement at time of discharge. CSW spoke with patient.  Patient expressed understanding of PT recommendation and is agreeable to SNF placement at time of discharge. Patient does not have a preference. CSW discussed insurance authorization process and will provide Medicare SNF ratings list. Patient has received 2 COVID vaccines. CSW will send out referrals for review.  No further questions reported at this time.   Skilled Nursing Rehab Facilities-   [email or web address]   Ratings out of 5 possible   Name Address  Phone # [place] Inspection Overall  [facility] [address], [place] 5 5 2 4   [facility]  [address] [place], [place] [phone] 4 2 5 5   [facility] [place], [place] 4 1 1 1   [place], [place] 2 2 4 4   [facility] [address], [place] 2 1 2 1   [place] 3 1 4 3   [facility] [address] [place] 5 2 2 3   [facility] [address], [place] 4 1 2 1   [facility] ([facility]) [address] [place] [phone] 5 1 2 2   [facility] [phone] [address], [place] [phone] 4 1 1 1   [facility] [address] [facility] [phone] 4 1 2 1   [facility] ([place]) [place]. [place], [place] [phone] 4 1 1 1           [place], [place] [address] [place] 4 2  3 3   [facility] [place] [address] [place] 3 1 5 4   [address] [place], [place] [phone] 2 1 1 1   [facility] [address], [employer] [phone] 2 2 3 3           [address] (no [facility]) [address], [place] [phone] 4 5 5 5   [facility] (No Humana) [address], [place] 4 1 4 3   [place] (No UHC) [place], [place] 5 5 5 5   [facility] [address] [place] [phone] 3 3 4 4   [place] [address] [place]  [phone] 2 2 2 2   Dustin Flock [address] [place] [phone] 3 1 3 2   [place] [address], [place] 1 1 2 1   [place] [address], [place] [phone] 2 1 1 1   [place], [place] 5 2 4 5   [facility] [address], [place] 2 1 1 1   [facility] [address], [place] [phone] 3 1 1 1   [facility] [place], [place] [phone] 2 1 2 1           [facility] [place] [address], [place] [phone] 5 3 3 4   [place], [place] 2 1 1 1   [place] (  No Humana) [address] [place] [phone] 2 1 2 1   [facility] [address] [place] [phone] 3 1 1 1           [facility] [place], [place] 4 1 5 4   [facility]) [place], [place] 2 1 3 2   [facility]) [place], [place] 3 1 4 3   [place] [address], [place] 4 1 4 3   [address] [place], [place] 3 3 1 1   [facility]) [address] [place] [phone] 3 2 3 3      Expected Discharge Plan: [place] Barriers to Discharge:  Continued Medical Work up, Ship broker, SNF Pending bed offer   Patient Goals and CMS Choice Patient states their goals for this hospitalization and ongoing recovery are:: To get better CMS Medicare.gov Compare Post Acute Care list provided to:: Patient Choice offered to / list presented to : Patient  Expected Discharge Plan and Services Expected Discharge Plan: [place]       Living arrangements for the past 2 months: Apartment                                      Prior Living Arrangements/Services Living arrangements for the past 2 months: Apartment Lives with:: Self, Relatives Patient language and need for interpreter reviewed:: Yes Do you feel safe going back to the place where you live?: Yes      Need for Family Participation in Patient Care: Yes (Comment) Care giver support system in place?: Yes (comment)   Criminal Activity/Legal Involvement Pertinent to Current Situation/Hospitalization: No - Comment as needed  Activities of Daily Living Home Assistive Devices/Equipment: Cane (specify quad or straight) ADL Screening (condition at time of admission) Patient's cognitive ability adequate to safely complete daily activities?: Yes Is the patient deaf or have difficulty hearing?: No Does the patient have difficulty seeing, even when wearing glasses/contacts?: No Does the patient have difficulty concentrating, remembering, or making decisions?: No Patient able to express need for assistance with ADLs?: Yes Does the patient have difficulty dressing or bathing?: No Independently performs ADLs?: Yes (appropriate for developmental age) Does the patient have difficulty walking or climbing stairs?: No Weakness of Legs: Both Weakness of Arms/Hands: Both  Permission Sought/Granted   Permission granted to share information with : Yes, Verbal Permission Granted     Permission granted to share info w AGENCY: SNF        Emotional  Assessment Appearance:: Appears older than stated age Attitude/Demeanor/Rapport: Engaged Affect (typically observed): Accepting Orientation: : Oriented to Place, Oriented to  Time, Oriented to Situation, Oriented to Self Alcohol / Substance Use: Not Applicable Psych Involvement: No (comment)  Admission diagnosis:  GI bleed [K92.2] Acute GI bleeding [K92.2] COVID-19 [U07.1] GI bleeding [K92.2] Patient Active Problem List   Diagnosis Date Noted   GI bleeding 08/12/2021   GI bleed 08/11/2021   COVID-19 virus infection 08/11/2021   Acute renal failure superimposed on stage 3 chronic kidney disease ([place]) 08/11/2021   Thrombocytopenia (HCC)    Cellulitis of left lower extremity 04/20/2020   Elevated alkaline phosphatase level 07/28/2019   Microscopic hematuria 09/24/2018   Rectal bleeding 09/24/2018   Insomnia 09/24/2018   Anxiety 09/14/2018   Primary hyperparathyroidism ([place]) 02/10/2018  Vitamin B 12 deficiency 12/11/2017   Pulmonary hypertension, mild ([place]) 12/11/2017   CKD (chronic kidney disease) stage 3, GFR 30-59 ml/min (HCC) 04/17/2017   Stasis dermatitis of both legs 04/17/2017   Osteoporosis 12/22/2016   Hx of gout 12/22/2016   Estrogen deficiency 05/12/2016   Vitamin D deficiency 05/12/2016   Weight loss 05/12/2016   HTN (hypertension) 06/12/2015   History of CVA (cerebrovascular accident) 03/28/2015   PCP:  [name], MD Pharmacy:   [facility] ([facility]), [place] [address] [place] [address] Phone: [phone] Fax: [phone]  [facility] [place], [place] [address] Phone: [phone] Fax: [phone]     Social Determinants of Health (SDOH) Interventions    Readmission Risk Interventions No flowsheet data found.

## 2021-08-14 NOTE — Plan of Care (Signed)
  Problem: Clinical Measurements: Goal: Diagnostic test results will improve Outcome: Progressing   

## 2021-08-14 NOTE — Progress Notes (Signed)
PROGRESS NOTE    Tina Waller  ION:[id number] DOB: Dec 21, 1940 DOA: 08/10/2021 PCP: [name], MD    Brief Narrative:  Brief hospital course: [name] is a 81 y.o. F with pHTN, HTN, CKD IIIb, hx stroke on Plavix, and hyperparathyoidism who presented with melena, cough.  In the ER, found to have COVID, also hemoglobin trending down.  Started on PPI and admitted.   2/1 son at bedside.  She is trying to drink the GI prep.      Consultants:  GI  Procedures:   Antimicrobials:      Subjective: Has no shortness of breath, abdominal pain, nausea or vomiting..  No melena  Objective: Vitals:   08/13/21 0757 08/13/21 1659 08/13/21 2037 08/14/21 0934  BP: (!) 163/70 120/60 (!) 154/67 (!) 141/68  Pulse: 88 88 100 93  Resp: '18 18 19 18  ' Temp: 99.8 F (37.7 C) 100 F (37.8 C) 98.3 F (36.8 C) 100 F (37.8 C)  TempSrc: Oral Oral  Oral  SpO2: 95% 97% 94% 96%  Weight:      Height:        Intake/Output Summary (Last 24 hours) at 08/14/2021 1342 Last data filed at 08/14/2021 0900 Gross per 24 hour  Intake 130 ml  Output 0 ml  Net 130 ml   Filed Weights   08/11/21 0359  Weight: 58 kg    Examination:  General exam: Appears calm and comfortable  Respiratory system: Clear to auscultation. Respiratory effort normal. Cardiovascular system: S1 & S2 heard, RRR. No gallop  Gastrointestinal system: Abdomen is nondistended, soft and nontender.  Normal bowel sounds heard. Central nervous system: Alert and oriented.  Gross intact  Extremities no edema Psychiatry:  Mood & affect appropriate.     Data Reviewed: I have personally reviewed following labs and imaging studies  CBC: Recent Labs  Lab 08/10/21 2310 08/11/21 0413 08/11/21 1659 08/12/21 0620 08/13/21 0439 08/14/21 0840  WBC 4.4 3.7*  --   --   --  4.6  HGB 13.7 11.4* 11.7* 10.8* 10.6* 10.7*  HCT 39.6 32.8* 34.2* 31.4* 31.3* 32.1*  MCV 97.1 96.2  --   --   --  99.1  PLT 177 149*  --   --   --   244   Basic Metabolic Panel: Recent Labs  Lab 08/10/21 2310 08/11/21 0413 08/14/21 0840  NA 138 137 146*  K 3.5 3.4* 3.6  CL 100 108 115*  CO2 18* 20* 20*  GLUCOSE 107* 93 89  BUN 43* 38* 10  CREATININE 2.22* 1.83* 1.25*  CALCIUM 8.3* 7.8* 8.6*   GFR: Estimated Creatinine Clearance: 34 mL/min (A) (by C-G formula based on SCr of 1.25 mg/dL (H)). Liver Function Tests: Recent Labs  Lab 08/10/21 2310 08/14/21 0840  AST 26 23  ALT 13 11  ALKPHOS 74 51  BILITOT 0.7 0.7  PROT 7.2 5.7*  ALBUMIN 3.6 2.6*   No results for input(s): LIPASE, AMYLASE in the last 168 hours. No results for input(s): AMMONIA in the last 168 hours. Coagulation Profile: No results for input(s): INR, PROTIME in the last 168 hours. Cardiac Enzymes: No results for input(s): CKTOTAL, CKMB, CKMBINDEX, TROPONINI in the last 168 hours. BNP (last 3 results) No results for input(s): PROBNP in the last 8760 hours. HbA1C: No results for input(s): HGBA1C in the last 72 hours. CBG: No results for input(s): GLUCAP in the last 168 hours. Lipid Profile: No results for input(s): CHOL, HDL, LDLCALC, TRIG, CHOLHDL, LDLDIRECT in  the last 72 hours. Thyroid Function Tests: No results for input(s): TSH, T4TOTAL, FREET4, T3FREE, THYROIDAB in the last 72 hours. Anemia Panel: Recent Labs    08/12/21 0620  VITAMINB12 4,102*  FOLATE 17.1  FERRITIN 200  TIBC 203*  IRON 23*  RETICCTPCT 1.5   Sepsis Labs: No results for input(s): PROCALCITON, LATICACIDVEN in the last 168 hours.  Recent Results (from the past 240 hour(s))  Resp Panel by RT-PCR (Flu A&B, Covid) Nasopharyngeal Swab     Status: Abnormal   Collection Time: 08/10/21 10:38 PM   Specimen: Nasopharyngeal Swab; Nasopharyngeal(NP) swabs in vial transport medium  Result Value Ref Range Status   SARS Coronavirus 2 by RT PCR POSITIVE (A) NEGATIVE Final    Comment: (NOTE) SARS-CoV-2 target nucleic acids are DETECTED.  The SARS-CoV-2 RNA is generally detectable  in upper respiratory specimens during the acute phase of infection. Positive results are indicative of the presence of the identified virus, but do not rule out bacterial infection or co-infection with other pathogens not detected by the test. Clinical correlation with patient history and other diagnostic information is necessary to determine patient infection status. The expected result is Negative.  Fact Sheet for Patients: EntrepreneurPulse.com.au  Fact Sheet for Healthcare Providers: IncredibleEmployment.be  This test is not yet approved or cleared by the [place] FDA and  has been authorized for detection and/or diagnosis of SARS-CoV-2 by FDA under an Emergency Use Authorization (EUA).  This EUA will remain in effect (meaning this test can be used) for the duration of  the COVID-19 declaration under Section 564(b)(1) of the A ct, 21 U.S.C. section 360bbb-3(b)(1), unless the authorization is terminated or revoked sooner.     Influenza A by PCR NEGATIVE NEGATIVE Final   Influenza B by PCR NEGATIVE NEGATIVE Final    Comment: (NOTE) The Xpert Xpress SARS-CoV-2/FLU/RSV plus assay is intended as an aid in the diagnosis of influenza from Nasopharyngeal swab specimens and should not be used as a sole basis for treatment. Nasal washings and aspirates are unacceptable for Xpert Xpress SARS-CoV-2/FLU/RSV testing.  Fact Sheet for Patients: EntrepreneurPulse.com.au  Fact Sheet for Healthcare Providers: IncredibleEmployment.be  This test is not yet approved or cleared by the [place] FDA and has been authorized for detection and/or diagnosis of SARS-CoV-2 by FDA under an Emergency Use Authorization (EUA). This EUA will remain in effect (meaning this test can be used) for the duration of the COVID-19 declaration under Section 564(b)(1) of the Act, 21 U.S.C. section 360bbb-3(b)(1), unless the authorization  is terminated or revoked.  Performed at [facility], [place] [address] [place], [place] [address]          Radiology Studies: DG CHEST PORT 1 VIEW  Result Date: 08/13/2021 CLINICAL DATA:  Hypoxia. EXAM: PORTABLE CHEST 1 VIEW COMPARISON:  Chest x-ray 08/12/2021. FINDINGS: There are atherosclerotic calcifications of the aorta. Heart size is borderline enlarged, unchanged. There is some minimal strandy bibasilar opacities, left greater than right. There is no pleural effusion or pneumothorax. No acute fractures are seen. IMPRESSION: 1. Minimal bibasilar atelectasis/airspace disease. Electronically Signed   By: Ronney Asters M.D.   On: 08/13/2021 17:18   DG CHEST PORT 1 VIEW  Result Date: 08/12/2021 CLINICAL DATA:  COVID-19 positivity with cough, initial encounter EXAM: PORTABLE CHEST 1 VIEW COMPARISON:  08/11/2021 FINDINGS: Cardiac shadow is stable. Aortic calcifications are noted. Lungs are well aerated bilaterally. No focal infiltrate or sizable effusion is seen. No bony abnormality is noted. IMPRESSION: No acute abnormality  noted. Electronically Signed   By: Inez Catalina M.D.   On: 08/12/2021 22:43        Scheduled Meds:  amLODipine  10 mg Oral Daily   feeding supplement  237 mL Oral TID BM   isosorbide mononitrate  90 mg Oral Daily   metoprolol succinate  100 mg Oral Daily   multivitamin with minerals  1 tablet Oral Daily   pantoprazole  40 mg Oral BID   peg 3350 powder  0.5 kit Oral Once   pravastatin  80 mg Oral Daily   sodium chloride flush  3 mL Intravenous Q12H   Continuous Infusions:  Assessment & Plan:   Principal Problem:   GI bleed Active Problems:   History of CVA (cerebrovascular accident)   HTN (hypertension)   Insomnia   COVID-19 virus infection   Acute renal failure superimposed on stage 3 chronic kidney disease (HCC)   Thrombocytopenia (HCC)   GI bleeding   Acute gastrointestinal hemorrhage, suspected upper GI bleed Acute blood loss  anemia Hgb down to 10 g/dL, now stable, no further melena.   Iron stores normal. 2/1 GI following plan for EGD and colonoscopy Hemoglobin remained stable Encourage patient to complete her GI prep      Acute kidney injury on Chronic kidney disease stage IIIb Baseline Cr 1.4-1.7.  2/1 likely prerenal, has improved Avoid nephrotoxic meds     COVID infection Outside window for Paxlovid (symptoms started >7 days PTA), 2/1 improvig and asymptomatic currently Continue airborne precautions   Hypertension Cerebrovascular disease Pulmonary hypertension Stable Continue metoprolol, amlodipine, Imdur     Hyperparathyroidism Follow-up with PCP as outpatient         DVT prophylaxis: SCD Code Status: Full Family Communication: Son at bedside Disposition Plan: Back: Status is: Inpatient Remains inpatient appropriate because: IV treatment.  Plan for EGD and colonoscopy pending  Planned Discharge Destination: Home with Home Health              LOS: 2 days   Time spent: 35 minutes with more than 50% COC    Tina Hanlon, MD [facility] Pager [phone]  If 7PM-7AM, please contact night-coverage 08/14/2021, 1:42 PM

## 2021-08-14 NOTE — Progress Notes (Addendum)
Daily Rounding Note  08/14/2021, 3:41 PM  LOS: 2 days   SUBJECTIVE:   Chief complaint:  Anemia, FOBT negative.  Doiarrhea    Unable to complete bowel prep this AM.  Slowly worked on this but stool still soft.  Procedures postponed and reset for tmrw AM  OBJECTIVE:         Vital signs in last 24 hours:    Temp:  [98.3 F (36.8 C)-100 F (37.8 C)] 100 F (37.8 C) (02/01 0934) Pulse Rate:  [88-100] 93 (02/01 0934) Resp:  [18-19] 18 (02/01 0934) BP: (120-154)/(60-68) 141/68 (02/01 0934) SpO2:  [94 %-97 %] 96 % (02/01 0934) Last BM Date: 08/13/21 Filed Weights   08/11/21 0359  Weight: 58 kg   Pt not re-examined.     Intake/Output from previous day: 01/31 0701 - 02/01 0700 In: 35 [P.O.:480; I.V.:10] Out: 0   Intake/Output this shift: No intake/output data recorded.  Lab Results: Recent Labs    08/12/21 0620 08/13/21 0439 08/14/21 0840  WBC  --   --  4.6  HGB 10.8* 10.6* 10.7*  HCT 31.4* 31.3* 32.1*  PLT  --   --  188   BMET Recent Labs    08/14/21 0840  NA 146*  K 3.6  CL 115*  CO2 20*  GLUCOSE 89  BUN 10  CREATININE 1.25*  CALCIUM 8.6*   LFT Recent Labs    08/14/21 0840  PROT 5.7*  ALBUMIN 2.6*  AST 23  ALT 11  ALKPHOS 51  BILITOT 0.7   PT/INR No results for input(s): LABPROT, INR in the last 72 hours. Hepatitis Panel No results for input(s): HEPBSAG, HCVAB, HEPAIGM, HEPBIGM in the last 72 hours.  Studies/Results: DG CHEST PORT 1 VIEW  Result Date: 08/13/2021 CLINICAL DATA:  Hypoxia. EXAM: PORTABLE CHEST 1 VIEW COMPARISON:  Chest x-ray 08/12/2021. FINDINGS: There are atherosclerotic calcifications of the aorta. Heart size is borderline enlarged, unchanged. There is some minimal strandy bibasilar opacities, left greater than right. There is no pleural effusion or pneumothorax. No acute fractures are seen. IMPRESSION: 1. Minimal bibasilar atelectasis/airspace disease. Electronically  Signed   By: Ronney Asters M.D.   On: 08/13/2021 17:18   DG CHEST PORT 1 VIEW  Result Date: 08/12/2021 CLINICAL DATA:  COVID-19 positivity with cough, initial encounter EXAM: PORTABLE CHEST 1 VIEW COMPARISON:  08/11/2021 FINDINGS: Cardiac shadow is stable. Aortic calcifications are noted. Lungs are well aerated bilaterally. No focal infiltrate or sizable effusion is seen. No bony abnormality is noted. IMPRESSION: No acute abnormality noted. Electronically Signed   By: Inez Catalina M.D.   On: 08/12/2021 22:43     Scheduled Meds:  amLODipine  10 mg Oral Daily   feeding supplement  237 mL Oral TID BM   isosorbide mononitrate  90 mg Oral Daily   metoCLOPramide (REGLAN) injection  10 mg Intravenous Q6H   metoprolol succinate  100 mg Oral Daily   multivitamin with minerals  1 tablet Oral Daily   pantoprazole  40 mg Oral BID   peg 3350 powder  0.5 kit Oral Once   polyethylene glycol powder  1 Container Oral Once   pravastatin  80 mg Oral Daily   sodium chloride flush  3 mL Intravenous Q12H   Continuous Infusions: PRN Meds:.acetaminophen **OR** acetaminophen, albuterol, traZODone  ASSESMENT:   Diarrhea.  Melenic stool reported but FOBT negative.  Protonix 40 po bid in place. 09/2016 colonoscopy limited by poor prep.  A TA polyp was removed, pandiverticulosis noted. No prior EGD.  Hx GERD, no PPI etc PTA.      Chronic Plavix on hold.  Last dose was 1/27    Covid 19 +, mild sxs, no anti-virals necessary.  CRP 6.8.       Anemia.  Low iron, low TIBC.  Borderline low TIBC.  Folate, B12 at or abve normal.     PLAN     Colonoscopy and EGD reset for 1030 AM tmrw.  Additional prep w split dose gatorade/miralax.      Tina Waller  08/14/2021, 3:41 PM Phone [phone]

## 2021-08-14 NOTE — Progress Notes (Signed)
Spoke with Endo nurse about the progress of her GI prep,patient has a hard time of complying with drinking the prep,nurse tries to encourage dring it by placing the straw onto her mouth,but patient is sipping very little.

## 2021-08-14 NOTE — Progress Notes (Signed)
Pt unable to complete bowel prep at this time. Nursing left message for Endo.

## 2021-08-14 NOTE — H&P (View-Only) (Signed)
Daily Rounding Note  08/14/2021, 3:41 PM  LOS: 2 days   SUBJECTIVE:   Chief complaint:  Anemia, FOBT negative.  Doiarrhea    Unable to complete bowel prep this AM.  Slowly worked on this but stool still soft.  Procedures postponed and reset for tmrw AM  OBJECTIVE:         Vital signs in last 24 hours:    Temp:  [98.3 F (36.8 C)-100 F (37.8 C)] 100 F (37.8 C) (02/01 0934) Pulse Rate:  [88-100] 93 (02/01 0934) Resp:  [18-19] 18 (02/01 0934) BP: (120-154)/(60-68) 141/68 (02/01 0934) SpO2:  [94 %-97 %] 96 % (02/01 0934) Last BM Date: 08/13/21 Filed Weights   08/11/21 0359  Weight: 58 kg   Pt not re-examined.     Intake/Output from previous day: 01/31 0701 - 02/01 0700 In: 35 [P.O.:480; I.V.:10] Out: 0   Intake/Output this shift: No intake/output data recorded.  Lab Results: Recent Labs    08/12/21 0620 08/13/21 0439 08/14/21 0840  WBC  --   --  4.6  HGB 10.8* 10.6* 10.7*  HCT 31.4* 31.3* 32.1*  PLT  --   --  188   BMET Recent Labs    08/14/21 0840  NA 146*  K 3.6  CL 115*  CO2 20*  GLUCOSE 89  BUN 10  CREATININE 1.25*  CALCIUM 8.6*   LFT Recent Labs    08/14/21 0840  PROT 5.7*  ALBUMIN 2.6*  AST 23  ALT 11  ALKPHOS 51  BILITOT 0.7   PT/INR No results for input(s): LABPROT, INR in the last 72 hours. Hepatitis Panel No results for input(s): HEPBSAG, HCVAB, HEPAIGM, HEPBIGM in the last 72 hours.  Studies/Results: DG CHEST PORT 1 VIEW  Result Date: 08/13/2021 CLINICAL DATA:  Hypoxia. EXAM: PORTABLE CHEST 1 VIEW COMPARISON:  Chest x-ray 08/12/2021. FINDINGS: There are atherosclerotic calcifications of the aorta. Heart size is borderline enlarged, unchanged. There is some minimal strandy bibasilar opacities, left greater than right. There is no pleural effusion or pneumothorax. No acute fractures are seen. IMPRESSION: 1. Minimal bibasilar atelectasis/airspace disease. Electronically  Signed   By: Ronney Asters M.D.   On: 08/13/2021 17:18   DG CHEST PORT 1 VIEW  Result Date: 08/12/2021 CLINICAL DATA:  COVID-19 positivity with cough, initial encounter EXAM: PORTABLE CHEST 1 VIEW COMPARISON:  08/11/2021 FINDINGS: Cardiac shadow is stable. Aortic calcifications are noted. Lungs are well aerated bilaterally. No focal infiltrate or sizable effusion is seen. No bony abnormality is noted. IMPRESSION: No acute abnormality noted. Electronically Signed   By: Inez Catalina M.D.   On: 08/12/2021 22:43     Scheduled Meds:  amLODipine  10 mg Oral Daily   feeding supplement  237 mL Oral TID BM   isosorbide mononitrate  90 mg Oral Daily   metoCLOPramide (REGLAN) injection  10 mg Intravenous Q6H   metoprolol succinate  100 mg Oral Daily   multivitamin with minerals  1 tablet Oral Daily   pantoprazole  40 mg Oral BID   peg 3350 powder  0.5 kit Oral Once   polyethylene glycol powder  1 Container Oral Once   pravastatin  80 mg Oral Daily   sodium chloride flush  3 mL Intravenous Q12H   Continuous Infusions: PRN Meds:.acetaminophen **OR** acetaminophen, albuterol, traZODone  ASSESMENT:   Diarrhea.  Melenic stool reported but FOBT negative.  Protonix 40 po bid in place. 09/2016 colonoscopy limited by poor prep.  A TA polyp was removed, pandiverticulosis noted. No prior EGD.  Hx GERD, no PPI etc PTA.      Chronic Plavix on hold.  Last dose was 1/27    Covid 19 +, mild sxs, no anti-virals necessary.  CRP 6.8.      Bliss Anemia.  Low iron, low TIBC.  Borderline low TIBC.  Folate, B12 at or abve normal.     PLAN     Colonoscopy and EGD reset for 1030 AM tmrw.  Additional prep w split dose gatorade/miralax.      Azucena Freed  08/14/2021, 3:41 PM Phone [phone]

## 2021-08-14 NOTE — NC FL2 (Signed)
°[place] MEDICAID FL2 LEVEL OF CARE SCREENING TOOL  °  ° °IDENTIFICATION  °Patient Name: °Tina Waller Birthdate: 12/18/1940 Sex: female Admission Date (Current Location): °08/10/2021  °County and Medicaid Number: ° [place] °  Facility and Address:  °[facility], [address], [place], [place] [address] °     Provider Number: °[id number]  °Attending Physician Name and Address:  °Amery, Sahar, MD ° Relative Name and Phone Number:  °Timothy,Kevin (Son)   [phone] °   °Current Level of Care: °Hospital Recommended Level of Care: °Skilled Nursing Facility Prior Approval Number: °  ° °Date Approved/Denied: °  PASRR Number: °[id number] ° °Discharge Plan: °SNF °  ° °Current Diagnoses: °Patient Active Problem List  ° Diagnosis Date Noted  ° GI bleeding 08/12/2021  ° GI bleed 08/11/2021  ° COVID-19 virus infection 08/11/2021  ° Acute renal failure superimposed on stage 3 chronic kidney disease (HCC) 08/11/2021  ° Thrombocytopenia (HCC)   ° Cellulitis of left lower extremity 04/20/2020  ° Elevated alkaline phosphatase level 07/28/2019  ° Microscopic hematuria 09/24/2018  ° Rectal bleeding 09/24/2018  ° Insomnia 09/24/2018  ° Anxiety 09/14/2018  ° Primary hyperparathyroidism (HCC) 02/10/2018  ° Vitamin B 12 deficiency 12/11/2017  ° Pulmonary hypertension, mild (HCC) 12/11/2017  ° CKD (chronic kidney disease) stage 3, GFR 30-59 ml/min (HCC) 04/17/2017  ° Stasis dermatitis of both legs 04/17/2017  ° Osteoporosis 12/22/2016  ° Hx of gout 12/22/2016  ° Estrogen deficiency 05/12/2016  ° Vitamin D deficiency 05/12/2016  ° Weight loss 05/12/2016  ° HTN (hypertension) 06/12/2015  ° History of CVA (cerebrovascular accident) 03/28/2015  ° ° °Orientation RESPIRATION BLADDER Height & Weight   °  °Self, Time, Situation, Place ° O2 (nasal cannula 4L) Incontinent Weight: 127 lb 13.9 oz (58 kg) °Height:  5' 9" (175.3 cm)  °BEHAVIORAL SYMPTOMS/MOOD NEUROLOGICAL BOWEL NUTRITION STATUS  °    Incontinent  Diet (see d/c summary)  °AMBULATORY STATUS COMMUNICATION OF NEEDS Skin   °Limited Assist Verbally Normal °  °  °  °    °     °     ° ° °Personal Care Assistance Level of Assistance  °Bathing, Dressing, Feeding Bathing Assistance: Limited assistance °Feeding assistance: Limited assistance °Dressing Assistance: Limited assistance °   ° °Functional Limitations Info  °Sight, Hearing, Speech Sight Info: Impaired °Hearing Info: Adequate °Speech Info: Adequate  ° ° °SPECIAL CARE FACTORS FREQUENCY  °    °  °  °  °  °  °  °   ° ° °Contractures Contractures Info: Not present  ° ° °Additional Factors Info  °Code Status, Allergies, Isolation Precautions Code Status Info: Full °Allergies Info: Lisinopril   Carvedilol   Hydralazine   Lipitor (Atorvastatin) °  °  °Isolation Precautions Info: Air/ Con COVID+ °   ° °Current Medications (08/14/2021):  This is the current hospital active medication list °Current Facility-Administered Medications  °Medication Dose Route Frequency Provider Last Rate Last Admin  ° acetaminophen (TYLENOL) tablet 650 mg  650 mg Oral Q6H PRN Melvin, Alexander B, MD   650 mg at 08/12/21 2131  ° Or  ° acetaminophen (TYLENOL) suppository 650 mg  650 mg Rectal Q6H PRN Melvin, Alexander B, MD      ° albuterol (VENTOLIN HFA) 108 (90 Base) MCG/ACT inhaler 2 puff  2 puff Inhalation Q4H PRN Chotiner, Bradley S, MD      ° amLODipine (NORVASC) tablet 10 mg  10 mg Oral Daily Melvin, Alexander   B, MD   10 mg at 08/13/21 1122  ° feeding supplement (ENSURE ENLIVE / ENSURE PLUS) liquid 237 mL  237 mL Oral TID BM Ghimire, Kuber, MD   237 mL at 08/12/21 1733  ° isosorbide mononitrate (IMDUR) 24 hr tablet 90 mg  90 mg Oral Daily Melvin, Alexander B, MD   90 mg at 08/13/21 1121  ° metoprolol succinate (TOPROL-XL) 24 hr tablet 100 mg  100 mg Oral Daily Melvin, Alexander B, MD   100 mg at 08/13/21 1125  ° multivitamin with minerals tablet 1 tablet  1 tablet Oral Daily Ghimire, Kuber, MD   1 tablet at 08/13/21 1121  ° pantoprazole  (PROTONIX) EC tablet 40 mg  40 mg Oral BID Gribbin, Sarah J, PA-C   40 mg at 08/13/21 2204  ° peg 3350 powder (MOVIPREP) kit 100 g  0.5 kit Oral Once Pham, Minh Q, RPH-CPP      ° pravastatin (PRAVACHOL) tablet 80 mg  80 mg Oral Daily Melvin, Alexander B, MD   80 mg at 08/13/21 1121  ° sodium chloride flush (NS) 0.9 % injection 3 mL  3 mL Intravenous Q12H Melvin, Alexander B, MD   3 mL at 08/13/21 2205  ° traZODone (DESYREL) tablet 100 mg  100 mg Oral QHS PRN Melvin, Alexander B, MD   100 mg at 08/12/21 2131  ° ° ° °Discharge Medications: °Please see discharge summary for a list of discharge medications. ° °Relevant Imaging Results: ° °Relevant Lab Results: ° ° °Additional Information °SSN:  [phone];  Pfizer COVID-19 Vaccine 11/01/2019 , 10/13/2019;  5'9" 127lbs ° °ReAndra F Blade, LCSWA ° ° ° ° °

## 2021-08-14 NOTE — Progress Notes (Signed)
Pt due to have EGD/Colonoscopy later today, pt not tolerating bowel prep well. Prep started at 1800 however pt only able to intake approximately one third of required dose to this point. BM x 2. Pt educated and verbalized an understanding. Will continue to encourage completion of bowel prep and will notify GI if necessary.

## 2021-08-15 ENCOUNTER — Encounter (HOSPITAL_COMMUNITY)
Admission: EM | Disposition: A | Discharge status: Skilled Nursing Facility | Payer: Self-pay | Source: Home / Self Care | Attending: Internal Medicine

## 2021-08-15 ENCOUNTER — Inpatient Hospital Stay (HOSPITAL_COMMUNITY): Payer: Medicare HMO | Admitting: Certified Registered"

## 2021-08-15 ENCOUNTER — Encounter (HOSPITAL_COMMUNITY): Payer: Self-pay | Admitting: Internal Medicine

## 2021-08-15 DIAGNOSIS — K222 Esophageal obstruction: Secondary | ICD-10-CM

## 2021-08-15 DIAGNOSIS — K635 Polyp of colon: Secondary | ICD-10-CM

## 2021-08-15 DIAGNOSIS — K2970 Gastritis, unspecified, without bleeding: Secondary | ICD-10-CM

## 2021-08-15 DIAGNOSIS — D490 Neoplasm of unspecified behavior of digestive system: Secondary | ICD-10-CM

## 2021-08-15 DIAGNOSIS — K259 Gastric ulcer, unspecified as acute or chronic, without hemorrhage or perforation: Secondary | ICD-10-CM

## 2021-08-15 HISTORY — PX: SUBMUCOSAL TATTOO INJECTION: SHX6856

## 2021-08-15 HISTORY — PX: HEMOSTASIS CLIP PLACEMENT: SHX6857

## 2021-08-15 HISTORY — PX: POLYPECTOMY: SHX5525

## 2021-08-15 HISTORY — PX: BIOPSY: SHX5522

## 2021-08-15 HISTORY — PX: COLONOSCOPY WITH PROPOFOL: SHX5780

## 2021-08-15 HISTORY — PX: ESOPHAGOGASTRODUODENOSCOPY (EGD) WITH PROPOFOL: SHX5813

## 2021-08-15 HISTORY — PX: HOT HEMOSTASIS: SHX5433

## 2021-08-15 SURGERY — ESOPHAGOGASTRODUODENOSCOPY (EGD) WITH PROPOFOL
Anesthesia: Monitor Anesthesia Care

## 2021-08-15 MED ORDER — PROPOFOL 10 MG/ML IV BOLUS
INTRAVENOUS | Status: DC | PRN
  Administered 2021-08-15: 25 mg via INTRAVENOUS
  Administered 2021-08-15: 15 mg via INTRAVENOUS

## 2021-08-15 MED ORDER — PROPOFOL 500 MG/50ML IV EMUL
INTRAVENOUS | Status: DC | PRN
  Administered 2021-08-15: 75 ug/kg/min via INTRAVENOUS

## 2021-08-15 MED ORDER — LIDOCAINE HCL URETHRAL/MUCOSAL 2 % EX GEL
CUTANEOUS | Status: AC
  Filled 2021-08-15: qty 6

## 2021-08-15 MED ORDER — LACTATED RINGERS IV SOLN: INTRAVENOUS | Status: DC

## 2021-08-15 MED ORDER — LIDOCAINE HCL URETHRAL/MUCOSAL 2 % EX GEL
CUTANEOUS | Status: DC | PRN
  Administered 2021-08-15: 1

## 2021-08-15 MED ORDER — SPOT INK MARKER SYRINGE KIT
PACK | SUBMUCOSAL | Status: DC | PRN
  Administered 2021-08-15: 1 mL via SUBMUCOSAL

## 2021-08-15 MED ORDER — LIDOCAINE 2% (20 MG/ML) 5 ML SYRINGE
INTRAMUSCULAR | Status: DC | PRN
Start: 2021-08-15 — End: 2021-08-15
  Administered 2021-08-15: 40 mg via INTRAVENOUS

## 2021-08-15 SURGICAL SUPPLY — 25 items

## 2021-08-15 NOTE — Interval H&P Note (Signed)
History and Physical Interval Note:  08/15/2021 2:52 PM  Tina Waller  has presented today for surgery, with the diagnosis of Anemia, FOBT positive stool.  Diarrhea in setting of COVID-19 infection..  The various methods of treatment have been discussed with the patient and family. After consideration of risks, benefits and other options for treatment, the patient has consented to  Procedure(s): ESOPHAGOGASTRODUODENOSCOPY (EGD) WITH PROPOFOL (N/A) COLONOSCOPY WITH PROPOFOL (N/A) as a surgical intervention.  The patient's history has been reviewed, patient examined, no change in status, stable for surgery.  I have reviewed the patient's chart and labs.  Questions were answered to the patient's satisfaction.     Sharyn Creamer

## 2021-08-15 NOTE — Anesthesia Procedure Notes (Addendum)
Procedure Name: MAC Date/Time: 08/15/2021 3:30 PM Performed by: Reece Agar, CRNA Pre-anesthesia Checklist: Patient identified, Emergency Drugs available, Suction available and Patient being monitored Patient Re-evaluated:Patient Re-evaluated prior to induction Oxygen Delivery Method: Nasal cannula

## 2021-08-15 NOTE — H&P (Signed)
GASTROENTEROLOGY PROCEDURE H&P NOTE   Primary Care Physician: [name], MD  HPI: Tina Waller is a 81 y.o. female who presents for EGD/Colonoscopy for evaluation of Anemia, FOBT+, diarrhea in setting of COVID19 infection.  Past Medical History:  Diagnosis Date   Anemia    Anxiety    Cataract    Depression    Diabetes mellitus without complication ([place])    pt denies   GERD (gastroesophageal reflux disease)    Gout    Headache    Hypertension    Stroke [facility]) 2008   09/14/2018   Tonsillar mass    Type 2 diabetes mellitus ([place]) 09/14/2018   Vertigo    Wears dentures    upper   Wears glasses    Past Surgical History:  Procedure Laterality Date   ABDOMINAL HYSTERECTOMY     BREAST SURGERY     lumpectomy   BUNIONECTOMY     CATARACT EXTRACTION Left    COLONOSCOPY W/ BIOPSIES AND POLYPECTOMY     MULTIPLE TOOTH EXTRACTIONS     TONGUE BIOPSY Right 12/13/2018   Procedure: RIGHT TONSIL BIOPSY;  Surgeon: Melida Quitter, MD;  Location: [facility] OR;  Service: ENT;  Laterality: Right;   Current Facility-Administered Medications  Medication Dose Route Frequency Provider Last Rate Last Admin   [MAR Hold] acetaminophen (TYLENOL) tablet 650 mg  650 mg Oral Q6H PRN Marcelyn Bruins, MD   650 mg at 08/12/21 2131   Or   [MAR Hold] acetaminophen (TYLENOL) suppository 650 mg  650 mg Rectal Q6H PRN Marcelyn Bruins, MD       [MAR Hold] albuterol (VENTOLIN HFA) 108 (90 Base) MCG/ACT inhaler 2 puff  2 puff Inhalation Q4H PRN Chotiner, Yevonne Aline, MD       [MAR Hold] amLODipine (NORVASC) tablet 10 mg  10 mg Oral Daily Marcelyn Bruins, MD   10 mg at 08/15/21 0955   [MAR Hold] feeding supplement (ENSURE ENLIVE / ENSURE PLUS) liquid 237 mL  237 mL Oral TID BM Barb Merino, MD   237 mL at 08/12/21 1733   [MAR Hold] isosorbide mononitrate (IMDUR) 24 hr tablet 90 mg  90 mg Oral Daily Marcelyn Bruins, MD   90 mg at 08/15/21 7741   lactated ringers infusion   Intravenous Continuous  Nolberto Hanlon, MD       [facility] Hold] metoprolol succinate (TOPROL-XL) 24 hr tablet 100 mg  100 mg Oral Daily Marcelyn Bruins, MD   100 mg at 08/15/21 1401   [MAR Hold] multivitamin with minerals tablet 1 tablet  1 tablet Oral Daily Barb Merino, MD   1 tablet at 08/14/21 1722   [MAR Hold] ondansetron (ZOFRAN) injection 4 mg  4 mg Intravenous Q8H Amery, Gwynneth Albright, MD   4 mg at 08/15/21 1358   [MAR Hold] pantoprazole (PROTONIX) EC tablet 40 mg  40 mg Oral BID Vena Rua, PA-C   40 mg at 08/14/21 2122   [facility] Hold] peg 3350 powder (MOVIPREP) kit 100 g  0.5 kit Oral Once Pham, Minh Q, RPH-CPP       [MAR Hold] pravastatin (PRAVACHOL) tablet 80 mg  80 mg Oral Daily Marcelyn Bruins, MD   80 mg at 08/14/21 1829   [MAR Hold] sodium chloride flush (NS) 0.9 % injection 3 mL  3 mL Intravenous Q12H Marcelyn Bruins, MD   3 mL at 08/15/21 0956   [MAR Hold] traZODone (DESYREL) tablet 100 mg  100 mg Oral QHS PRN  Marcelyn Bruins, MD   100 mg at 08/12/21 2131    Current Facility-Administered Medications:    [MAR Hold] acetaminophen (TYLENOL) tablet 650 mg, 650 mg, Oral, Q6H PRN, 650 mg at 08/12/21 2131 **OR** [MAR Hold] acetaminophen (TYLENOL) suppository 650 mg, 650 mg, Rectal, Q6H PRN, Marcelyn Bruins, MD   [MAR Hold] albuterol (VENTOLIN HFA) 108 (90 Base) MCG/ACT inhaler 2 puff, 2 puff, Inhalation, Q4H PRN, Chotiner, Yevonne Aline, MD   [MAR Hold] amLODipine (NORVASC) tablet 10 mg, 10 mg, Oral, Daily, Marcelyn Bruins, MD, 10 mg at 08/15/21 0955   [MAR Hold] feeding supplement (ENSURE ENLIVE / ENSURE PLUS) liquid 237 mL, 237 mL, Oral, TID BM, Barb Merino, MD, 237 mL at 08/12/21 1733   [MAR Hold] isosorbide mononitrate (IMDUR) 24 hr tablet 90 mg, 90 mg, Oral, Daily, Marcelyn Bruins, MD, 90 mg at 08/15/21 9826   lactated ringers infusion, , Intravenous, Continuous, Nolberto Hanlon, MD   [facility] Hold] metoprolol succinate (TOPROL-XL) 24 hr tablet 100 mg, 100 mg, Oral, Daily, Marcelyn Bruins,  MD, 100 mg at 08/15/21 1401   [MAR Hold] multivitamin with minerals tablet 1 tablet, 1 tablet, Oral, Daily, Barb Merino, MD, 1 tablet at 08/14/21 1722   [MAR Hold] ondansetron (ZOFRAN) injection 4 mg, 4 mg, Intravenous, Q8H, Amery, Sahar, MD, 4 mg at 08/15/21 1358   [MAR Hold] pantoprazole (PROTONIX) EC tablet 40 mg, 40 mg, Oral, BID, Vena Rua, PA-C, 40 mg at 08/14/21 2122   [COMPLETED] peg 3350 powder (MOVIPREP) kit 100 g, 0.5 kit, Oral, Once, 100 g at 08/13/21 1805 **AND** [MAR Hold] peg 3350 powder (MOVIPREP) kit 100 g, 0.5 kit, Oral, Once, Pham, Minh Q, RPH-CPP   [MAR Hold] pravastatin (PRAVACHOL) tablet 80 mg, 80 mg, Oral, Daily, Marcelyn Bruins, MD, 80 mg at 08/14/21 1829   [MAR Hold] sodium chloride flush (NS) 0.9 % injection 3 mL, 3 mL, Intravenous, Q12H, Marcelyn Bruins, MD, 3 mL at 08/15/21 0956   [MAR Hold] traZODone (DESYREL) tablet 100 mg, 100 mg, Oral, QHS PRN, Marcelyn Bruins, MD, 100 mg at 08/12/21 2131 Allergies  Allergen Reactions   Lisinopril Cough   Carvedilol Diarrhea   Hydralazine     Vomiting and diarrhea   Lipitor [Atorvastatin] Diarrhea   Family History  Problem Relation Age of Onset   Heart disease Mother        CHF   Osteoporosis Neg Hx    Colon cancer Neg Hx    Stomach cancer Neg Hx    Pancreatic cancer Neg Hx    Social History   Socioeconomic History   Marital status: Divorced    Spouse name: Not on file   Number of children: Not on file   Years of education: Not on file   Highest education level: Not on file  Occupational History   Not on file  Tobacco Use   Smoking status: Former    Types: Cigarettes    Quit date: 07/14/2006    Years since quitting: 15.0   Smokeless tobacco: Never  Vaping Use   Vaping Use: Never used  Substance and Sexual Activity   Alcohol use: Yes    Alcohol/week: 4.0 standard drinks    Types: 4 Shots of liquor per week    Comment: 4 drinks a week   Drug use: No   Sexual activity: Not Currently   Other Topics Concern   Not on file  Social History Narrative   Not on file   Social  Determinants of Health   Financial Resource Strain: Not on file  Food Insecurity: Not on file  Transportation Needs: Not on file  Physical Activity: Not on file  Stress: Not on file  Social Connections: Not on file  Intimate Partner Violence: Not on file    Physical Exam: Today's Vitals   08/15/21 0554 08/15/21 0721 08/15/21 0952 08/15/21 1438  BP: 139/64  (!) 144/60 (!) 144/59  Pulse: 83  85 92  Resp: '16  18 18  ' Temp: 99.2 F (37.3 C)  98.4 F (36.9 C) 98 F (36.7 C)  TempSrc: Oral  Oral Temporal  SpO2: 94%  94% 95%  Weight:      Height:      PainSc:  0-No pain 0-No pain 0-No pain   Body mass index is 18.88 kg/m. GEN: NAD EYE: Sclerae anicteric ENT: MMM CV: Non-tachycardic GI: Soft, NT/ND NEURO:  Alert & Oriented x 3  Lab Results: Recent Labs    08/13/21 0439 08/14/21 0840  WBC  --  4.6  HGB 10.6* 10.7*  HCT 31.3* 32.1*  PLT  --  188   BMET Recent Labs    08/14/21 0840  NA 146*  K 3.6  CL 115*  CO2 20*  GLUCOSE 89  BUN 10  CREATININE 1.25*  CALCIUM 8.6*   LFT Recent Labs    08/14/21 0840  PROT 5.7*  ALBUMIN 2.6*  AST 23  ALT 11  ALKPHOS 51  BILITOT 0.7   PT/INR No results for input(s): LABPROT, INR in the last 72 hours.   Impression / Plan: This is a 81 y.o.female who presents for EGD/Colonoscopy for evaluation of Anemia, FOBT+, diarrhea in setting of COVID19 infection.   The risks and benefits of endoscopic evaluation/treatment were discussed with the patient and/or family; these include but are not limited to the risk of perforation, infection, bleeding, missed lesions, lack of diagnosis, severe illness requiring hospitalization, as well as anesthesia and sedation related illnesses.  The patient's history has been reviewed, patient examined, no change in status, and deemed stable for procedure.  The patient and/or family is agreeable to proceed.     Justice Britain, MD [facility] Office # [phone]

## 2021-08-15 NOTE — Progress Notes (Signed)
PROGRESS NOTE    Tina Waller  JSH:[id number] DOB: 1940-12-02 DOA: 08/10/2021 PCP: [name], MD    Brief Narrative:  Tina Waller is a 81 y.o. F with pHTN, HTN, CKD IIIb, hx stroke on Plavix, and hyperparathyoidism who presented with melena, cough.  In the ER, found to have COVID, also hemoglobin trending down.  Started on PPI and admitted.   2/1 son at bedside.  She is trying to drink the GI prep.   2/2 plan for EGD and C-scope today.  Patient currently n.p.o.   Consultants:  GI  Procedures:   Antimicrobials:      Subjective: No complaints this AM.  Denies wheeze, abdominal pain, nausea vomiting  Objective: Vitals:   08/14/21 1715 08/14/21 1939 08/15/21 0554 08/15/21 0952  BP: (!) 162/76 (!) 150/72 139/64 (!) 144/60  Pulse: (!) 123 87 83 85  Resp: '18 19 16 18  ' Temp: 98.6 F (37 C) 98.5 F (36.9 C) 99.2 F (37.3 C) 98.4 F (36.9 C)  TempSrc: Oral  Oral Oral  SpO2: 97% 95% 94% 94%  Weight:      Height:        Intake/Output Summary (Last 24 hours) at 08/15/2021 1412 Last data filed at 08/15/2021 0900 Gross per 24 hour  Intake 955 ml  Output 0 ml  Net 955 ml   Filed Weights   08/11/21 0359  Weight: 58 kg    Examination:  General exam: Appears calm and comfortable  Respiratory system: Clear to auscultation. Respiratory effort normal. Cardiovascular system: S1 & S2 heard, RRR. No gallop Gastrointestinal system: Abdomen is nondistended, soft and nontender. Normal bowel sounds heard. Central nervous system: Alert and oriented x3. No focal neurological deficits. Extremities: No edema Psychiatry:  Mood & affect appropriate.     Data Reviewed: I have personally reviewed following labs and imaging studies  CBC: Recent Labs  Lab 08/10/21 2310 08/11/21 0413 08/11/21 1659 08/12/21 0620 08/13/21 0439 08/14/21 0840  WBC 4.4 3.7*  --   --   --  4.6  HGB 13.7 11.4* 11.7* 10.8* 10.6* 10.7*  HCT 39.6 32.8* 34.2* 31.4* 31.3* 32.1*  MCV 97.1  96.2  --   --   --  99.1  PLT 177 149*  --   --   --  850   Basic Metabolic Panel: Recent Labs  Lab 08/10/21 2310 08/11/21 0413 08/14/21 0840  NA 138 137 146*  K 3.5 3.4* 3.6  CL 100 108 115*  CO2 18* 20* 20*  GLUCOSE 107* 93 89  BUN 43* 38* 10  CREATININE 2.22* 1.83* 1.25*  CALCIUM 8.3* 7.8* 8.6*   GFR: Estimated Creatinine Clearance: 34 mL/min (A) (by C-G formula based on SCr of 1.25 mg/dL (H)). Liver Function Tests: Recent Labs  Lab 08/10/21 2310 08/14/21 0840  AST 26 23  ALT 13 11  ALKPHOS 74 51  BILITOT 0.7 0.7  PROT 7.2 5.7*  ALBUMIN 3.6 2.6*   No results for input(s): LIPASE, AMYLASE in the last 168 hours. No results for input(s): AMMONIA in the last 168 hours. Coagulation Profile: No results for input(s): INR, PROTIME in the last 168 hours. Cardiac Enzymes: No results for input(s): CKTOTAL, CKMB, CKMBINDEX, TROPONINI in the last 168 hours. BNP (last 3 results) No results for input(s): PROBNP in the last 8760 hours. HbA1C: No results for input(s): HGBA1C in the last 72 hours. CBG: No results for input(s): GLUCAP in the last 168 hours. Lipid Profile: No results for input(s): CHOL, HDL,  LDLCALC, TRIG, CHOLHDL, LDLDIRECT in the last 72 hours. Thyroid Function Tests: No results for input(s): TSH, T4TOTAL, FREET4, T3FREE, THYROIDAB in the last 72 hours. Anemia Panel: No results for input(s): VITAMINB12, FOLATE, FERRITIN, TIBC, IRON, RETICCTPCT in the last 72 hours. Sepsis Labs: No results for input(s): PROCALCITON, LATICACIDVEN in the last 168 hours.  Recent Results (from the past 240 hour(s))  Resp Panel by RT-PCR (Flu A&B, Covid) Nasopharyngeal Swab     Status: Abnormal   Collection Time: 08/10/21 10:38 PM   Specimen: Nasopharyngeal Swab; Nasopharyngeal(NP) swabs in vial transport medium  Result Value Ref Range Status   SARS Coronavirus 2 by RT PCR POSITIVE (A) NEGATIVE Final    Comment: (NOTE) SARS-CoV-2 target nucleic acids are DETECTED.  The  SARS-CoV-2 RNA is generally detectable in upper respiratory specimens during the acute phase of infection. Positive results are indicative of the presence of the identified virus, but do not rule out bacterial infection or co-infection with other pathogens not detected by the test. Clinical correlation with patient history and other diagnostic information is necessary to determine patient infection status. The expected result is Negative.  Fact Sheet for Patients: EntrepreneurPulse.com.au  Fact Sheet for Healthcare Providers: IncredibleEmployment.be  This test is not yet approved or cleared by the [place] FDA and  has been authorized for detection and/or diagnosis of SARS-CoV-2 by FDA under an Emergency Use Authorization (EUA).  This EUA will remain in effect (meaning this test can be used) for the duration of  the COVID-19 declaration under Section 564(b)(1) of the A ct, 21 U.S.C. section 360bbb-3(b)(1), unless the authorization is terminated or revoked sooner.     Influenza A by PCR NEGATIVE NEGATIVE Final   Influenza B by PCR NEGATIVE NEGATIVE Final    Comment: (NOTE) The Xpert Xpress SARS-CoV-2/FLU/RSV plus assay is intended as an aid in the diagnosis of influenza from Nasopharyngeal swab specimens and should not be used as a sole basis for treatment. Nasal washings and aspirates are unacceptable for Xpert Xpress SARS-CoV-2/FLU/RSV testing.  Fact Sheet for Patients: EntrepreneurPulse.com.au  Fact Sheet for Healthcare Providers: IncredibleEmployment.be  This test is not yet approved or cleared by the [place] FDA and has been authorized for detection and/or diagnosis of SARS-CoV-2 by FDA under an Emergency Use Authorization (EUA). This EUA will remain in effect (meaning this test can be used) for the duration of the COVID-19 declaration under Section 564(b)(1) of the Act, 21 U.S.C. section  360bbb-3(b)(1), unless the authorization is terminated or revoked.  Performed at [facility], [address], [place], [place] [address]          Radiology Studies: DG CHEST PORT 1 VIEW  Result Date: 08/13/2021 CLINICAL DATA:  Hypoxia. EXAM: PORTABLE CHEST 1 VIEW COMPARISON:  Chest x-ray 08/12/2021. FINDINGS: There are atherosclerotic calcifications of the aorta. Heart size is borderline enlarged, unchanged. There is some minimal strandy bibasilar opacities, left greater than right. There is no pleural effusion or pneumothorax. No acute fractures are seen. IMPRESSION: 1. Minimal bibasilar atelectasis/airspace disease. Electronically Signed   By: Ronney Asters M.D.   On: 08/13/2021 17:18        Scheduled Meds:  amLODipine  10 mg Oral Daily   feeding supplement  237 mL Oral TID BM   isosorbide mononitrate  90 mg Oral Daily   metoprolol succinate  100 mg Oral Daily   multivitamin with minerals  1 tablet Oral Daily   ondansetron (ZOFRAN) IV  4 mg Intravenous Q8H  pantoprazole  40 mg Oral BID   peg 3350 powder  0.5 kit Oral Once   pravastatin  80 mg Oral Daily   sodium chloride flush  3 mL Intravenous Q12H   Continuous Infusions:  Assessment & Plan:   Principal Problem:   GI bleed Active Problems:   History of CVA (cerebrovascular accident)   HTN (hypertension)   Insomnia   COVID-19 virus infection   Acute renal failure superimposed on stage 3 chronic kidney disease (HCC)   Thrombocytopenia (HCC)   GI bleeding   Acute gastrointestinal hemorrhage, suspected upper GI bleed Acute blood loss anemia Hgb down to 10 g/dL, now stable, no further melena.   Iron stores normal. 2/2 plan for EGD and colonoscopy Hemoglobin stable We will start her on LR for hydration since she is n.p.o.      Acute kidney injury on Chronic kidney disease stage IIIb Baseline Cr 1.4-1.7.  2/2 likely prerenal improved with IV fluids Avoid nephrotoxic meds      COVID  infection Outside window for Paxlovid (symptoms started >7 days PTA), 2/2 asymptomatic currently Continue airborne precautions Wean off oxygen as tolerated keeping O2 sats above 92%     Hypertension Cerebrovascular disease Pulmonary hypertension Stable Continue metoprolol, amlodipine        Hyperparathyroidism Follow-up with PCP as outpatient           DVT prophylaxis: SCD Code Status: Full Family Communication: Son at bedside Disposition Plan: Home Status is: Inpatient Remains inpatient appropriate because: IV treatment.  Plan for EGD and colonoscopy pending  Planned Discharge Destination: Home with Home Health              LOS: 3 days   Time spent: 35 minutes with more than 50% on [place], MD [facility] Pager [phone]  If 7PM-7AM, please contact night-coverage 08/15/2021, 2:12 PM

## 2021-08-15 NOTE — Anesthesia Preprocedure Evaluation (Addendum)
Anesthesia Evaluation  Patient identified by MRN, date of birth, ID band Patient awake    Reviewed: Allergy & Precautions, H&P , NPO status , Patient's Chart, lab work & pertinent test results, reviewed documented beta blocker date and time   Airway Mallampati: II  TM Distance: >3 FB Neck ROM: Full    Dental no notable dental hx. (+) Poor Dentition, Dental Advisory Given   Pulmonary neg pulmonary ROS, former smoker,    Pulmonary exam normal breath sounds clear to auscultation       Cardiovascular hypertension, Pt. on medications and Pt. on home beta blockers  Rhythm:Regular Rate:Normal     Neuro/Psych  Headaches, Anxiety Depression CVA    GI/Hepatic Neg liver ROS, GERD  ,  Endo/Other  diabetes  Renal/GU negative Renal ROS  negative genitourinary   Musculoskeletal   Abdominal   Peds  Hematology  (+) Blood dyscrasia, anemia ,   Anesthesia Other Findings   Reproductive/Obstetrics negative OB ROS                            Anesthesia Physical Anesthesia Plan  ASA: 3  Anesthesia Plan: MAC   Post-op Pain Management: Minimal or no pain anticipated   Induction:   PONV Risk Score and Plan: 2 and Propofol infusion and Treatment may vary due to age or medical condition  Airway Management Planned: Natural Airway and Nasal Cannula  Additional Equipment:   Intra-op Plan:   Post-operative Plan:   Informed Consent: I have reviewed the patients History and Physical, chart, labs and discussed the procedure including the risks, benefits and alternatives for the proposed anesthesia with the patient or authorized representative who has indicated his/her understanding and acceptance.     Dental advisory given  Plan Discussed with: CRNA  Anesthesia Plan Comments:         Anesthesia Quick Evaluation

## 2021-08-15 NOTE — Progress Notes (Signed)
Physical Therapy Treatment Patient Details Name: Tina Waller MRN: [id number] DOB: 1941-01-04 Today's Date: 08/15/2021   History of Present Illness 81 year old presented to the ER with black stools without any nausea vomiting or abdominal pain.  In the emergency room she was found to have acute kidney injury, initial hemoglobin was stable but subsequently dropped by 2 points, covid +, initial symptoms around 1/23; with history of pulmonary hypertension, hyperparathyroidism, insomnia, anxiety, hypertension, CKD stage IIIb, history of stroke on Plavix    PT Comments    Continuing work on functional mobility and activity tolerance;  Session focused on progressive amb in room (pt declined hallway due to concern for need to be close to toilet); Able to walk on 2 L O2, an improvement from 4 L O2 last session); anticipate EGD/colonoscopy later today   Recommendations for follow up therapy are one component of a multi-disciplinary discharge planning process, led by the attending physician.  Recommendations may be updated based on patient status, additional functional criteria and insurance authorization.  Follow Up Recommendations  Skilled nursing-short term rehab (<3 hours/day)     Assistance Recommended at Discharge PRN  Patient can return home with the following A little help with walking and/or transfers;A little help with bathing/dressing/bathroom;Assistance with cooking/housework   Equipment Recommendations  BSC/3in1 (consider Rollator RW)    Recommendations for Other Services       Precautions / Restrictions Precautions Precautions: Fall Precaution Comments: Air/Con Covid; now on supplemental O2     Mobility  Bed Mobility Overal bed mobility: Needs Assistance Bed Mobility: Supine to Sit, Sit to Supine     Supine to sit: Min assist Sit to supine: Min assist   General bed mobility comments: min hand held assist to pull to full sit and assist LEs back into bed     Transfers Overall transfer level: Needs assistance Equipment used: Rolling walker (2 wheels) Transfers: Sit to/from Stand Sit to Stand: Min assist, From elevated surface           General transfer comment: Min assist to steady and rise from low bed    Ambulation/Gait Ambulation/Gait assistance: Min assist Gait Distance (Feet): 60 Feet (laps in room) Assistive device: Rolling walker (2 wheels) Gait Pattern/deviations: Step-through pattern Gait velocity: slowed     General Gait Details: Cues to self-monitor for activity tolerance; less steady today, needing bil UE support to steady during amb; walked on 2 L supplemental O2, no overt DOE   Marine scientist Rankin (Stroke Patients Only)       Balance     Sitting balance-Leahy Scale: Good       Standing balance-Leahy Scale: Fair                              Cognition Arousal/Alertness: Awake/alert Behavior During Therapy: WFL for tasks assessed/performed Overall Cognitive Status: Within Functional Limits for tasks assessed                                          Exercises      General Comments General comments (skin integrity, edema, etc.): Able to stand at sink to wash face adn brush teeth      Pertinent Vitals/Pain Pain Assessment Pain Assessment: Faces Faces Pain Scale: Hurts  a little bit Pain Location: Abdomen; soreness from coughing a lot Pain Descriptors / Indicators: Aching, Sore Pain Intervention(s): Monitored during session    Home Living                          Prior Function            PT Goals (current goals can now be found in the care plan section) Acute Rehab PT Goals Patient Stated Goal: get stronger PT Goal Formulation: With patient Time For Goal Achievement: 08/26/21 Potential to Achieve Goals: Good Progress towards PT goals: Progressing toward goals    Frequency    Min 2X/week       PT Plan Current plan remains appropriate    Co-evaluation              AM-PAC PT "6 Clicks" Mobility   Outcome Measure  Help needed turning from your back to your side while in a flat bed without using bedrails?: None Help needed moving from lying on your back to sitting on the side of a flat bed without using bedrails?: A Little Help needed moving to and from a bed to a chair (including a wheelchair)?: A Little Help needed standing up from a chair using your arms (e.g., wheelchair or bedside chair)?: A Lot Help needed to walk in hospital room?: A Lot Help needed climbing 3-5 steps with a railing? : A Lot 6 Click Score: 16    End of Session Equipment Utilized During Treatment: Oxygen Activity Tolerance: Patient limited by fatigue Patient left: in bed;with call bell/phone within reach Nurse Communication: Mobility status PT Visit Diagnosis: Unsteadiness on feet (R26.81);Muscle weakness (generalized) (M62.81)     Time: 6389-3734 PT Time Calculation (min) (ACUTE ONLY): 13 min  Charges:  $Gait Training: 8-22 mins                     Roney Marion, [place]  Acute Rehabilitation Services Pager [phone] Office [phone]    Tina Waller 08/15/2021, 2:29 PM

## 2021-08-15 NOTE — Transfer of Care (Signed)
Immediate Anesthesia Transfer of Care Note  Patient: Tina Waller  Procedure(s) Performed: ESOPHAGOGASTRODUODENOSCOPY (EGD) WITH PROPOFOL COLONOSCOPY WITH PROPOFOL HOT HEMOSTASIS (ARGON PLASMA COAGULATION/BICAP) BIOPSY HEMOSTASIS CLIP PLACEMENT POLYPECTOMY SUBMUCOSAL TATTOO INJECTION  Patient Location: Endoscopy Unit  Anesthesia Type:MAC  Level of Consciousness: drowsy  Airway & Oxygen Therapy: Patient Spontanous Breathing and Patient connected to nasal cannula oxygen  Post-op Assessment: Report given to RN and Post -op Vital signs reviewed and stable  Post vital signs: Reviewed and stable  Last Vitals:  Vitals Value Taken Time  BP 132/63 08/15/21 16:32  Temp    Pulse 86 08/15/21 16:30  Resp 20 08/15/21 16:30  SpO2 98 08/15/21 16:30    Last Pain:  Vitals:   08/15/21 1438  TempSrc: Temporal  PainSc: 0-No pain      Patients Stated Pain Goal: 0 (62/26/33 3545)  Complications: No notable events documented.

## 2021-08-15 NOTE — TOC Progression Note (Signed)
Transition of Care [facility]) - Initial/Assessment Note    Patient Details  Name: Tina Waller MRN: [id number] Date of Birth: 09/09/1940  [facility]) CM/SW Contact:    Milinda Antis, LCSWA Phone Number: 08/15/2021, 4:27 PM  Clinical Narrative:                 CSW presented bed offers and the patient agreed to [facility].  CSW contacted [facility] with admissions and confirmed that the patient can come to the facility.  The facility was also informed  that they would need to start insurance auth.     Expected Discharge Plan: Skilled Nursing Facility Barriers to Discharge: Continued Medical Work up, Ship broker, SNF Pending bed offer   Patient Goals and CMS Choice Patient states their goals for this hospitalization and ongoing recovery are:: To get better CMS Medicare.gov Compare Post Acute Care list provided to:: Patient Choice offered to / list presented to : Patient  Expected Discharge Plan and Services Expected Discharge Plan: [place]       Living arrangements for the past 2 months: Apartment                                      Prior Living Arrangements/Services Living arrangements for the past 2 months: Apartment Lives with:: Self, Relatives Patient language and need for interpreter reviewed:: Yes Do you feel safe going back to the place where you live?: Yes      Need for Family Participation in Patient Care: Yes (Comment) Care giver support system in place?: Yes (comment)   Criminal Activity/Legal Involvement Pertinent to Current Situation/Hospitalization: No - Comment as needed  Activities of Daily Living Home Assistive Devices/Equipment: Cane (specify quad or straight) ADL Screening (condition at time of admission) Patient's cognitive ability adequate to safely complete daily activities?: Yes Is the patient deaf or have difficulty hearing?: No Does the patient have difficulty seeing, even when wearing  glasses/contacts?: No Does the patient have difficulty concentrating, remembering, or making decisions?: No Patient able to express need for assistance with ADLs?: Yes Does the patient have difficulty dressing or bathing?: No Independently performs ADLs?: Yes (appropriate for developmental age) Does the patient have difficulty walking or climbing stairs?: No Weakness of Legs: Both Weakness of Arms/Hands: Both  Permission Sought/Granted   Permission granted to share information with : Yes, Verbal Permission Granted     Permission granted to share info w AGENCY: SNF        Emotional Assessment Appearance:: Appears older than stated age Attitude/Demeanor/Rapport: Engaged Affect (typically observed): Accepting Orientation: : Oriented to Place, Oriented to  Time, Oriented to Situation, Oriented to Self Alcohol / Substance Use: Not Applicable Psych Involvement: No (comment)  Admission diagnosis:  GI bleed [K92.2] Acute GI bleeding [K92.2] COVID-19 [U07.1] GI bleeding [K92.2] Patient Active Problem List   Diagnosis Date Noted   GI bleeding 08/12/2021   GI bleed 08/11/2021   COVID-19 virus infection 08/11/2021   Acute renal failure superimposed on stage 3 chronic kidney disease ([place]) 08/11/2021   Thrombocytopenia (HCC)    Cellulitis of left lower extremity 04/20/2020   Elevated alkaline phosphatase level 07/28/2019   Microscopic hematuria 09/24/2018   Rectal bleeding 09/24/2018   Insomnia 09/24/2018   Anxiety 09/14/2018   Primary hyperparathyroidism ([place]) 02/10/2018   Vitamin B 12 deficiency 12/11/2017   Pulmonary hypertension, mild ([place]) 12/11/2017   CKD (chronic kidney  disease) stage 3, GFR 30-59 ml/min (HCC) 04/17/2017   Stasis dermatitis of both legs 04/17/2017   Osteoporosis 12/22/2016   Hx of gout 12/22/2016   Estrogen deficiency 05/12/2016   Vitamin D deficiency 05/12/2016   Weight loss 05/12/2016   HTN (hypertension) 06/12/2015   History of CVA (cerebrovascular  accident) 03/28/2015   PCP:  [name], MD Pharmacy:   [facility] [address] ([address]), [place] [address] [place]) [place] [address] Phone: [phone] Fax: [phone]  [facility] [place], [place] [address] Phone: [phone] Fax: [phone]     Social Determinants of Health (SDOH) Interventions    Readmission Risk Interventions No flowsheet data found.

## 2021-08-15 NOTE — Anesthesia Postprocedure Evaluation (Signed)
Anesthesia Post Note  Patient: STEVI HOLLINSHEAD  Procedure(s) Performed: ESOPHAGOGASTRODUODENOSCOPY (EGD) WITH PROPOFOL COLONOSCOPY WITH PROPOFOL HOT HEMOSTASIS (ARGON PLASMA COAGULATION/BICAP) BIOPSY HEMOSTASIS CLIP PLACEMENT POLYPECTOMY SUBMUCOSAL TATTOO INJECTION     Patient location during evaluation: PACU Anesthesia Type: MAC Level of consciousness: awake and alert Pain management: pain level controlled Vital Signs Assessment: post-procedure vital signs reviewed and stable Respiratory status: spontaneous breathing, nonlabored ventilation, respiratory function stable and patient connected to nasal cannula oxygen Cardiovascular status: stable and blood pressure returned to baseline Postop Assessment: no apparent nausea or vomiting Anesthetic complications: no   No notable events documented.  Last Vitals:  Vitals:   08/15/21 1633 08/15/21 1642  BP: 132/63 (!) 132/44  Pulse: 85 83  Resp: 17 (!) 21  Temp: (!) 36.2 C   SpO2: 99% 96%    Last Pain:  Vitals:   08/15/21 1642  TempSrc:   PainSc: 0-No pain                 Dorin Stooksbury

## 2021-08-15 NOTE — Op Note (Addendum)
[facility] Patient Name: Tina Waller Procedure Date : 08/15/2021 MRN: [id number] Attending MD: Justice Britain , MD Date of Birth: March 15, 1941 CSN: [id number] Age: 81 Admit Type: Inpatient Procedure:                Colonoscopy Indications:              Iron deficiency anemia, Change in bowel habits Providers:                Kary Kos RN, RN, Benetta Spar, Technician,                            Justice Britain, MD Referring MD:             Estill Cotta. Loletha Carrow, MD, [place], [facility] Medicines:                Monitored Anesthesia Care Complications:            No immediate complications. Estimated Blood Loss:     Estimated blood loss was minimal. Procedure:                Pre-Anesthesia Assessment:                           - Prior to the procedure, a History and Physical                            was performed, and patient medications and                            allergies were reviewed. The patient's tolerance of                            previous anesthesia was also reviewed. The risks                            and benefits of the procedure and the sedation                            options and risks were discussed with the patient.                            All questions were answered, and informed consent                            was obtained. Prior Anticoagulants: The patient has                            taken Plavix (clopidogrel), last dose was 5 days                            prior to procedure. ASA Grade Assessment: III - A  patient with severe systemic disease. After                            reviewing the risks and benefits, the patient was                            deemed in satisfactory condition to undergo the                            procedure.                           After obtaining informed consent, the colonoscope                            was passed  under direct vision. Throughout the                            procedure, the patient's blood pressure, pulse, and                            oxygen saturations were monitored continuously. The                            [id number]-HQ190TL ([id number] Olympus peds colonoscope was                            introduced through the anus and advanced to the 5                            cm into the ileum. The colonoscopy was performed                            without difficulty. The patient tolerated the                            procedure. The quality of the bowel preparation was                            adequate. The terminal ileum, ileocecal valve,                            appendiceal orifice, and rectum were photographed. Scope In: 3:46:56 PM Scope Out: 4:25:56 PM Scope Withdrawal Time: 0 hours 28 minutes 34 seconds  Total Procedure Duration: 0 hours 39 minutes 0 seconds  Findings:      The digital rectal exam findings include palpable rectal mass.      The terminal ileum and ileocecal valve appeared normal.      Four sessile polyps were found in the recto-sigmoid colon (1),       descending colon (1) and ascending colon (2). The polyps were 3 to 7 mm       in size. These polyps were removed with a cold snare. Resection and       retrieval were complete.      A frond-like/villous,  fungating, polypoid and ulcerated partially       obstructing large mass was found in the proximal to middle rectum       (beginning at 7 cm and extending to 12 cm from the anal os. The mass was       partially circumferential. The mass measured five cm in length at least.       Oozing was present. Biopsies were taken with a cold forceps for       histology to rule out malignancy. Tattoo placed proximal to the lesion.      Multiple small-mouthed diverticula were found in the entire colon.      Normal mucosa was found in the entire colon otherwise.      Non-bleeding non-thrombosed external and internal hemorrhoids  were found       during retroflexion, during perianal exam and during digital exam. The       hemorrhoids were Grade II (internal hemorrhoids that prolapse but reduce       spontaneously). Impression:               - Palpable rectal mass found on digital rectal exam.                           - The examined portion of the ileum was normal.                           - Four 3 to 7 mm polyps at the recto-sigmoid colon,                            in the descending colon and in the ascending colon,                            removed with a cold snare. Resected and retrieved.                           - Rule out malignancy, partially obstructing tumor                            in the proximal/middle rectum. Biopsied. Tattooed                            proximal to lesion.                           - Diverticulosis in the entire examined colon.                           - Normal mucosa in the entire examined colon                            otherwise.                           - Non-bleeding non-thrombosed external and internal                            hemorrhoids. Recommendation:           -  The patient will be observed post-procedure,                            until all discharge criteria are met.                           - Return patient to hospital ward for ongoing care.                           - Advance diet as tolerated.                           - Hold Plavix for at least another few days while                            diagnostic return. Further discussions with                            Medicine +/- Cardiology as to long term use of this                            medication should be had.                           - Await pathology results.                           - This mass, even if it is not malignant and is                            just pre-malignant is not endoscopically amenable                            to resection. I think in the hands of the                             Colorectal surgeons with consideration of a                            Transanal-excision would be her best chance at this                            being removed (if there is no evidence of                            malignancy on final pathology). However, if this is                            a rectal malignancy then standard Chemo-XRT prior                            to surgical resection would need to be considered.                           -  Recommend a CT-CAP to be performed. May even                            consider a Pelvic MRI.                           - CEA to be obtained tomorrow AM.                           - She has anemia as a result of a combination of                            upper GI findings and most definitely lower GI                            findings.                           - Expect BRBPR for the next 1-2 days.                           - The findings and recommendations were discussed                            with the patient.                           - The findings and recommendations were discussed                            with the patient's family.                           - The findings and recommendations were discussed                            with the referring physician. Procedure Code(s):        --- Professional ---                           [phone], Colonoscopy, flexible; with removal of                            tumor(s), polyp(s), or other lesion(s) by snare                            technique                           50354, 3, Colonoscopy, flexible; with biopsy,                            single or multiple Diagnosis Code(s):        --- Professional ---  K62.89, Other specified diseases of anus and rectum                           K64.1, Second degree hemorrhoids                           K63.5, Polyp of colon                           D49.0, Neoplasm of unspecified behavior of                             digestive system                           K56.690, Other partial intestinal obstruction                           D50.9, Iron deficiency anemia, unspecified                           R19.4, Change in bowel habit                           K57.30, Diverticulosis of large intestine without                            perforation or abscess without bleeding CPT copyright 2019 American Medical Association. All rights reserved. The codes documented in this report are preliminary and upon coder review may  be revised to meet current compliance requirements. Justice Britain, MD 08/15/2021 5:17:22 PM Number of Addenda: 0

## 2021-08-15 NOTE — Op Note (Signed)
[facility] Patient Name: Tina Waller Procedure Date : 08/15/2021 MRN: [id number] Attending MD: Justice Britain , MD Date of Birth: Feb 01, 1941 CSN: [id number] Age: 81 Admit Type: Inpatient Procedure:                Upper GI endoscopy Indications:              Iron deficiency anemia Providers:                Kary Kos RN, RN, Benetta Spar, Technician,                            Justice Britain, MD Referring MD:             Estill Cotta. Loletha Carrow, MD, [place], [facility] Medicines:                Monitored Anesthesia Care Complications:            No immediate complications. Estimated Blood Loss:     Estimated blood loss was minimal. Procedure:                Pre-Anesthesia Assessment:                           - Prior to the procedure, a History and Physical                            was performed, and patient medications and                            allergies were reviewed. The patient's tolerance of                            previous anesthesia was also reviewed. The risks                            and benefits of the procedure and the sedation                            options and risks were discussed with the patient.                            All questions were answered, and informed consent                            was obtained. Prior Anticoagulants: The patient has                            taken Plavix (clopidogrel), last dose was 5 days                            prior to procedure. ASA Grade Assessment: III - A  patient with severe systemic disease. After                            reviewing the risks and benefits, the patient was                            deemed in satisfactory condition to undergo the                            procedure.                           After obtaining informed consent, the endoscope was                            passed under direct  vision. Throughout the                            procedure, the patient's blood pressure, pulse, and                            oxygen saturations were monitored continuously. The                            [id number]-H190 ([id number]) Olympus endoscope was introduced                            through the mouth, and advanced to the second part                            of duodenum. The upper GI endoscopy was                            accomplished without difficulty. The patient                            tolerated the procedure. Scope In: Scope Out: Findings:      No gross lesions were noted in the entire esophagus.      A non-obstructing Schatzki ring was found at the gastroesophageal       junction.      The Z-line was irregular and was found 39 cm from the incisors.      A 3 cm hiatal hernia was present.      A single small angioectasia with no bleeding was found in the gastric       body. Fulguration to ablate the lesion to prevent bleeding by argon       plasma was successful. For hemostasis, two hemostatic clips were       successfully placed (MR conditional). There was no bleeding at the end       of the procedure.      Three non-bleeding linear gastric ulcers with a clean ulcer base       (Forrest Class III) were found in the gastric antrum. The largest lesion       was 14 mm in largest dimension.      Patchy moderate inflammation characterized  by erosions and erythema was       found in the entire examined stomach. Biopsies were taken with a cold       forceps for histology and Helicobacter pylori testing.      No gross lesions were noted in the duodenal bulb, in the first portion       of the duodenum and in the second portion of the duodenum. Biopsies for       histology were taken with a cold forceps for evaluation of celiac       disease. Impression:               - No gross lesions in esophagus. Non-obstructing                            Schatzki ring. Z-line irregular, 39 cm  from the                            incisors.                           - 3 cm hiatal hernia.                           - A single non-bleeding angioectasia in the                            stomach. Treated with argon plasma coagulation                            (APC). Clipped                           - Non-bleeding linear gastric ulcers with a clean                            ulcer base (Forrest Class III). Gastritis. Biopsied.                           - No gross lesions in the duodenal bulb, in the                            first portion of the duodenum and in the second                            portion of the duodenum. Biopsied. Recommendation:           - Proceed to scheduled colonoscopy.                           - PPI 40 mg twice daily.                           - Await pathology results.                           - Observe patient's clinical course.                           -  Consider repeat EGD in 74-months to ensure healing.                           - Plavix discussion on Colonoscopy report.                           - The findings and recommendations were discussed                            with the patient.                           - The findings and recommendations were discussed                            with the patient's family.                           - The findings and recommendations were discussed                            with the referring physician. Procedure Code(s):        --- Professional ---                           [phone], Esophagogastroduodenoscopy, flexible,                            transoral; with biopsy, single or multiple Diagnosis Code(s):        --- Professional ---                           K22.2, Esophageal obstruction                           K22.8, Other specified diseases of esophagus                           K44.9, Diaphragmatic hernia without obstruction or                            gangrene                           K29.70,  Gastritis, unspecified, without bleeding                           K25.9, Gastric ulcer, unspecified as acute or                            chronic, without hemorrhage or perforation                           D50.9, Iron deficiency anemia, unspecified CPT copyright 2019 American Medical Association. All rights reserved. The codes documented in this report are preliminary and upon coder review may  be revised to meet current compliance requirements. Justice Britain, MD 08/15/2021  5:00:13 PM Number of Addenda: 0

## 2021-08-15 NOTE — Plan of Care (Signed)
  Problem: Education: Goal: Knowledge of General Education information will improve Description Including pain rating scale, medication(s)/side effects and non-pharmacologic comfort measures Outcome: Progressing   

## 2021-08-16 ENCOUNTER — Inpatient Hospital Stay (HOSPITAL_COMMUNITY): Payer: Medicare HMO

## 2021-08-16 DIAGNOSIS — K922 Gastrointestinal hemorrhage, unspecified: Secondary | ICD-10-CM | POA: Diagnosis not present

## 2021-08-16 LAB — SODIUM: Sodium: 144 mmol/L (ref 135–145)

## 2021-08-16 MED ORDER — ONDANSETRON HCL 4 MG/2ML IJ SOLN: 4.0000 mg | Freq: Three times a day (TID) | INTRAMUSCULAR | Status: DC | PRN

## 2021-08-16 MED ORDER — IOHEXOL 300 MG/ML  SOLN
100.0000 mL | Freq: Once | INTRAMUSCULAR | Status: AC | PRN
  Administered 2021-08-16: 100 mL via INTRAVENOUS

## 2021-08-16 MED ORDER — IOHEXOL 9 MG/ML PO SOLN
ORAL | Status: AC
  Administered 2021-08-16: 500 mL
  Filled 2021-08-16: qty 1000

## 2021-08-16 MED ORDER — BENZONATATE 100 MG PO CAPS
100.0000 mg | ORAL_CAPSULE | Freq: Two times a day (BID) | ORAL | Status: DC
  Administered 2021-08-16 – 2021-08-21 (×10): 100 mg via ORAL
  Filled 2021-08-16 (×10): qty 1

## 2021-08-16 NOTE — Progress Notes (Signed)
PROGRESS NOTE    Tina Waller  EXH:[id number] DOB: 1940/08/22 DOA: 08/10/2021 PCP: Tina Pier, MD    Brief Narrative:  Tina Waller is a 81 y.o. F with pHTN, HTN, CKD IIIb, hx stroke on Plavix, and hyperparathyoidism who presented with melena, cough.  In the ER, found to have COVID, also hemoglobin trending down.  Started on PPI and admitted.   2/1 son at bedside.  She is trying to drink the GI prep.   2/2 plan for EGD and C-scope today.  Patient currently n.p.o. 2/3 has no complaints   Consultants:  GI  Procedures:   Antimicrobials:      Subjective: No abd pain, n/v, no sob  Objective: Vitals:   08/15/21 1707 08/15/21 2045 08/16/21 0511 08/16/21 0810  BP: (!) 131/58 (!) 143/57 (!) 143/62 (!) 143/62  Pulse: 80 84 85 82  Resp: '18 18 13 18  ' Temp: 97.9 F (36.6 C) 99.7 F (37.6 C) 99.7 F (37.6 C) 99.5 F (37.5 C)  TempSrc: Oral  Oral Oral  SpO2: 97% 96% 94% 95%  Weight:      Height:        Intake/Output Summary (Last 24 hours) at 08/16/2021 1509 Last data filed at 08/16/2021 0900 Gross per 24 hour  Intake 739.04 ml  Output 0 ml  Net 739.04 ml   Filed Weights   08/11/21 0359  Weight: 58 kg    Examination: Calm, NAD Cta no w/r Reg s1/s2 no gallop Soft benign +bs No edema Awake and alert. Grossly intact Mood and affect appropriate in current setting   Data Reviewed: I have personally reviewed following labs and imaging studies  CBC: Recent Labs  Lab 08/10/21 2310 08/11/21 0413 08/11/21 1659 08/12/21 0620 08/13/21 0439 08/14/21 0840  WBC 4.4 3.7*  --   --   --  4.6  HGB 13.7 11.4* 11.7* 10.8* 10.6* 10.7*  HCT 39.6 32.8* 34.2* 31.4* 31.3* 32.1*  MCV 97.1 96.2  --   --   --  99.1  PLT 177 149*  --   --   --  381   Basic Metabolic Panel: Recent Labs  Lab 08/10/21 2310 08/11/21 0413 08/14/21 0840 08/16/21 0401  NA 138 137 146* 144  K 3.5 3.4* 3.6  --   CL 100 108 115*  --   CO2 18* 20* 20*  --   GLUCOSE 107* 93 89  --    BUN 43* 38* 10  --   CREATININE 2.22* 1.83* 1.25*  --   CALCIUM 8.3* 7.8* 8.6*  --    GFR: Estimated Creatinine Clearance: 34 mL/min (A) (by C-G formula based on SCr of 1.25 mg/dL (H)). Liver Function Tests: Recent Labs  Lab 08/10/21 2310 08/14/21 0840  AST 26 23  ALT 13 11  ALKPHOS 74 51  BILITOT 0.7 0.7  PROT 7.2 5.7*  ALBUMIN 3.6 2.6*   No results for input(s): LIPASE, AMYLASE in the last 168 hours. No results for input(s): AMMONIA in the last 168 hours. Coagulation Profile: No results for input(s): INR, PROTIME in the last 168 hours. Cardiac Enzymes: No results for input(s): CKTOTAL, CKMB, CKMBINDEX, TROPONINI in the last 168 hours. BNP (last 3 results) No results for input(s): PROBNP in the last 8760 hours. HbA1C: No results for input(s): HGBA1C in the last 72 hours. CBG: No results for input(s): GLUCAP in the last 168 hours. Lipid Profile: No results for input(s): CHOL, HDL, LDLCALC, TRIG, CHOLHDL, LDLDIRECT in the last 72 hours. Thyroid  Function Tests: No results for input(s): TSH, T4TOTAL, FREET4, T3FREE, THYROIDAB in the last 72 hours. Anemia Panel: No results for input(s): VITAMINB12, FOLATE, FERRITIN, TIBC, IRON, RETICCTPCT in the last 72 hours. Sepsis Labs: No results for input(s): PROCALCITON, LATICACIDVEN in the last 168 hours.  Recent Results (from the past 240 hour(s))  Resp Panel by RT-PCR (Flu A&B, Covid) Nasopharyngeal Swab     Status: Abnormal   Collection Time: 08/10/21 10:38 PM   Specimen: Nasopharyngeal Swab; Nasopharyngeal(NP) swabs in vial transport medium  Result Value Ref Range Status   SARS Coronavirus 2 by RT PCR POSITIVE (A) NEGATIVE Final    Comment: (NOTE) SARS-CoV-2 target nucleic acids are DETECTED.  The SARS-CoV-2 RNA is generally detectable in upper respiratory specimens during the acute phase of infection. Positive results are indicative of the presence of the identified virus, but do not rule out bacterial infection or  co-infection with other pathogens not detected by the test. Clinical correlation with patient history and other diagnostic information is necessary to determine patient infection status. The expected result is Negative.  Fact Sheet for Patients: EntrepreneurPulse.com.au  Fact Sheet for Healthcare Providers: IncredibleEmployment.be  This test is not yet approved or cleared by the [place] FDA and  has been authorized for detection and/or diagnosis of SARS-CoV-2 by FDA under an Emergency Use Authorization (EUA).  This EUA will remain in effect (meaning this test can be used) for the duration of  the COVID-19 declaration under Section 564(b)(1) of the A ct, 21 U.S.C. section 360bbb-3(b)(1), unless the authorization is terminated or revoked sooner.     Influenza A by PCR NEGATIVE NEGATIVE Final   Influenza B by PCR NEGATIVE NEGATIVE Final    Comment: (NOTE) The Xpert Xpress SARS-CoV-2/FLU/RSV plus assay is intended as an aid in the diagnosis of influenza from Nasopharyngeal swab specimens and should not be used as a sole basis for treatment. Nasal washings and aspirates are unacceptable for Xpert Xpress SARS-CoV-2/FLU/RSV testing.  Fact Sheet for Patients: EntrepreneurPulse.com.au  Fact Sheet for Healthcare Providers: IncredibleEmployment.be  This test is not yet approved or cleared by the [place] FDA and has been authorized for detection and/or diagnosis of SARS-CoV-2 by FDA under an Emergency Use Authorization (EUA). This EUA will remain in effect (meaning this test can be used) for the duration of the COVID-19 declaration under Section 564(b)(1) of the Act, 21 U.S.C. section 360bbb-3(b)(1), unless the authorization is terminated or revoked.  Performed at [facility], [place] [address] [place], [place] [address]          Radiology Studies: No results found.      Scheduled  Meds:  amLODipine  10 mg Oral Daily   feeding supplement  237 mL Oral TID BM   isosorbide mononitrate  90 mg Oral Daily   metoprolol succinate  100 mg Oral Daily   multivitamin with minerals  1 tablet Oral Daily   pantoprazole  40 mg Oral BID   peg 3350 powder  0.5 kit Oral Once   pravastatin  80 mg Oral Daily   sodium chloride flush  3 mL Intravenous Q12H   Continuous Infusions:  lactated ringers 50 mL/hr at 08/15/21 1738    Assessment & Plan:   Principal Problem:   GI bleed Active Problems:   History of CVA (cerebrovascular accident)   HTN (hypertension)   Insomnia   COVID-19 virus infection   Acute renal failure superimposed on stage 3 chronic kidney disease (HCC)   Thrombocytopenia ([place])  GI bleeding   Acute gastrointestinal hemorrhage, suspected upper GI bleed Acute blood loss anemia Hgb down to 10 g/dL, now stable, no further melena.   Iron stores normal. 2/3 status post EGD and colonoscopy on 2/2  Found with 4 rectosigmoid colon polyps.  Also found with rectal mass which was biopsied.  Depending on path report we will decide whether it needs oncology involvement versus surgery. Checking CEA Checking CT GI rec. Holding plavix for few days    Acute kidney injury on Chronic kidney disease stage IIIb Baseline Cr 1.4-1.7.  2/3 prerenal improved with IV fluids We will DC IV fluids Avoid nephrotoxic meds     COVID infection Outside window for Paxlovid (symptoms started >7 days PTA), 2/2 asymptomatic currently Continue airborne precautions 2/3 wean off oxygen as tolerated keeping O2 sats 92%       Hypertension Cerebrovascular disease Pulmonary hypertension Stable Continue metoprolol and amlodipine       Hyperparathyroidism Follow-up with PCP as outpatient           DVT prophylaxis: SCD Code Status: Full Family Communication: Son at bedside Disposition Plan: Home Status is: Inpatient Remains inpatient appropriate because: IV treatment.   Plan for EGD and colonoscopy pending  Planned Discharge Destination: Home with Home Health              LOS: 4 days   Time spent: 35 minutes with more than 50% on [place], MD [facility] Pager [phone]  If 7PM-7AM, please contact night-coverage 08/16/2021, 3:09 PM

## 2021-08-16 NOTE — TOC Progression Note (Signed)
Transition of Care [facility]) - Initial/Assessment Note    Patient Details  Name: Tina Waller MRN: [id number] Date of Birth: Nov 19, 1940  Transition of Care [facility]) CM/SW Contact:    Milinda Antis, LCSWA Phone Number: 08/16/2021, 12:44 PM  Clinical Narrative:                 CSW was contacted by [facility] with admissions informing CSW that insurance authorization has been approved.  CSW informed [facility] that the patient will need to be inpatient a few more days according to MD.  [employer] authorization is for 5 days.  If patient does not d/c on Monday, the facility will need to be notified to request an extension.    Expected Discharge Plan: Skilled Nursing Facility Barriers to Discharge: Continued Medical Work up, Ship broker, SNF Pending bed offer   Patient Goals and CMS Choice Patient states their goals for this hospitalization and ongoing recovery are:: To get better CMS Medicare.gov Compare Post Acute Care list provided to:: Patient Choice offered to / list presented to : Patient  Expected Discharge Plan and Services Expected Discharge Plan: [place]       Living arrangements for the past 2 months: Apartment                                      Prior Living Arrangements/Services Living arrangements for the past 2 months: Apartment Lives with:: Self, Relatives Patient language and need for interpreter reviewed:: Yes Do you feel safe going back to the place where you live?: Yes      Need for Family Participation in Patient Care: Yes (Comment) Care giver support system in place?: Yes (comment)   Criminal Activity/Legal Involvement Pertinent to Current Situation/Hospitalization: No - Comment as needed  Activities of Daily Living Home Assistive Devices/Equipment: Cane (specify quad or straight) ADL Screening (condition at time of admission) Patient's cognitive ability adequate to safely complete daily activities?: Yes Is the patient deaf or  have difficulty hearing?: No Does the patient have difficulty seeing, even when wearing glasses/contacts?: No Does the patient have difficulty concentrating, remembering, or making decisions?: No Patient able to express need for assistance with ADLs?: Yes Does the patient have difficulty dressing or bathing?: No Independently performs ADLs?: Yes (appropriate for developmental age) Does the patient have difficulty walking or climbing stairs?: No Weakness of Legs: Both Weakness of Arms/Hands: Both  Permission Sought/Granted   Permission granted to share information with : Yes, Verbal Permission Granted     Permission granted to share info w AGENCY: SNF        Emotional Assessment Appearance:: Appears older than stated age Attitude/Demeanor/Rapport: Engaged Affect (typically observed): Accepting Orientation: : Oriented to Place, Oriented to  Time, Oriented to Situation, Oriented to Self Alcohol / Substance Use: Not Applicable Psych Involvement: No (comment)  Admission diagnosis:  GI bleed [K92.2] Acute GI bleeding [K92.2] COVID-19 [U07.1] GI bleeding [K92.2] Patient Active Problem List   Diagnosis Date Noted   GI bleeding 08/12/2021   GI bleed 08/11/2021   COVID-19 virus infection 08/11/2021   Acute renal failure superimposed on stage 3 chronic kidney disease ([place]) 08/11/2021   Thrombocytopenia (HCC)    Cellulitis of left lower extremity 04/20/2020   Elevated alkaline phosphatase level 07/28/2019   Microscopic hematuria 09/24/2018   Rectal bleeding 09/24/2018   Insomnia 09/24/2018   Anxiety 09/14/2018   Primary hyperparathyroidism ([place]) 02/10/2018  Vitamin B 12 deficiency 12/11/2017   Pulmonary hypertension, mild ([place]) 12/11/2017   CKD (chronic kidney disease) stage 3, GFR 30-59 ml/min (HCC) 04/17/2017   Stasis dermatitis of both legs 04/17/2017   Osteoporosis 12/22/2016   Hx of gout 12/22/2016   Estrogen deficiency 05/12/2016   Vitamin D deficiency 05/12/2016    Weight loss 05/12/2016   HTN (hypertension) 06/12/2015   History of CVA (cerebrovascular accident) 03/28/2015   PCP:  [name], MD Pharmacy:   [facility] ([facility]), [place] - [address] [place] ([place]) [place] [address] Phone: [phone] Fax: [phone]  [facility] [place] [place] [address] Phone: [phone] Fax: [phone]     Social Determinants of Health (SDOH) Interventions    Readmission Risk Interventions No flowsheet data found.

## 2021-08-16 NOTE — Progress Notes (Signed)
Ok to change zofran to q8 PRN per Vicie Mutters.  Onnie Boer, PharmD, BCIDP, AAHIVP, CPP Infectious Disease Pharmacist 08/16/2021 10:06 AM

## 2021-08-16 NOTE — Progress Notes (Signed)
Pt off unit to CT

## 2021-08-16 NOTE — Progress Notes (Signed)
Progress Note   Subjective  Chief Complaint:Anemia  Status post EGD and colon 08/16/2021 with Dr. Rush Landmark. EGD 3 cm hiatal hernia, angiectasia in stomach status post APC/clipped, nonbleeding gastric ulcers, pending gastric and duodenal biopsies. Colonoscopy palpable rectal mass not amendable to endoscopic resection, for 3 to 7 mm polyps, tics, nonbleeding external/internal hemorrhoids.  Patient lying in bed comfortable, has cough but does not appear to have increased work of breathing.  Patient denies abdominal pain, nausea vomiting.  Has empty breakfast tray.  Patient has not had a bowel movement yet.    Objective   Vital signs in last 24 hours: Temp:  [97.2 F (36.2 C)-99.7 F (37.6 C)] 99.5 F (37.5 C) (02/03 0810) Pulse Rate:  [80-92] 82 (02/03 0810) Resp:  [13-21] 18 (02/03 0810) BP: (131-144)/(44-63) 143/62 (02/03 0810) SpO2:  [94 %-100 %] 95 % (02/03 0810) Last BM Date: 08/15/21  General: Pleasant, comfortable.  Elderly.  Looks a bit frail. Heart: RRR. Chest: Decreased breath sounds bilaterally with cough, no increased work of breathing Abdomen: Soft, not tender.  No distention.  Active bowel sounds. Extremities: No CCE. Neuro/Psych: Oriented x3, pleasant, cooperative.  Intake/Output from previous day: 02/02 0701 - 02/03 0700 In: 885 [P.O.:120; I.V.:499] Out: 0  Intake/Output this shift: No intake/output data recorded.  Lab Results: Recent Labs    08/14/21 0840  WBC 4.6  HGB 10.7*  HCT 32.1*  PLT 188   BMET Recent Labs    08/14/21 0840 08/16/21 0401  NA 146* 144  K 3.6  --   CL 115*  --   CO2 20*  --   GLUCOSE 89  --   BUN 10  --   CREATININE 1.25*  --   CALCIUM 8.6*  --    LFT Recent Labs    08/14/21 0840  PROT 5.7*  ALBUMIN 2.6*  AST 23  ALT 11  ALKPHOS 51  BILITOT 0.7   PT/INR No results for input(s): LABPROT, INR in the last 72 hours.  Studies/Results: No results found.  EGD - 3 cm hiatal hernia. - A single  non-bleeding angioectasia in the stomach. Treated with argon plasma coagulation (APC). Clipped - Non-bleeding linear gastric ulcers with a clean ulcer base ([place] Class III). Gastritis. Biopsied. - No gross lesions in the duodenal bulb, in the first portion of the duodenum and in the second portion of the duodenum. Biopsied. Colon - Palpable rectal mass found on digital rectal exam. - The examined portion of the ileum was normal. - Four 3 to 7 mm polyps at the recto-sigmoid colon, in the descending colon and in the ascending colon, removed with a cold snare. Resected and retrieved. - Rule out malignancy, partially obstructing tumor in the proximal/middle rectum. Biopsied. Tattooed proximal to lesion. - Diverticulosis in the entire examined colon. - Normal mucosa in the entire examined colon otherwise. - Non-bleeding non-thrombosed external and internal hemorrhoids.   Impression/Plan:   Normocytic Anemia. With IDA HGB 10.7 MCV 99.1  WBC 4.6 Platelets 188 08/12/2021 Iron 23 Ferritin 200 B12 4,102 EGD with nonbleeding angiectasia status post APC, clipped, nonbleeding gastric ulcers pending biopsy. Colonoscopy with palpable rectal mass not endoscopically amendable to resection.   -Pending biopsy, may need referral colorectal surgeon -Pending CT chest abdomen pelvis -Pending CEA  Diarrhea.   Melenic stool reported but FOBT negative.  Protonix 40 po bid in place. 09/2016 colonoscopy limited by poor prep.  A TA polyp was removed, pandiverticulosis noted. No prior EGD.  Hx GERD, no  PPI etc PTA.     Chronic Plavix on hold.   Last dose was 1/27 Continue to hold for a few days waiting for path Will need evaluation by cardiology of the risks/benefits of continuing plavix in the future.   Covid 19 +,  mild sxs, no anti-virals necessary.      Future Appointments  Date Time Provider [place]  08/20/2021  1:30 PM [name], MD [facility] None  10/17/2021  3:00 PM [facility] DX  DEXA 1 [facility] GI-BREAST CE      LOS: 4 days   Tina Waller  08/16/2021, 8:21 AM

## 2021-08-17 LAB — BASIC METABOLIC PANEL
Anion gap: 9 (ref 5–15)
BUN: 7 mg/dL — ABNORMAL LOW (ref 8–23)
CO2: 23 mmol/L (ref 22–32)
Calcium: 8.7 mg/dL — ABNORMAL LOW (ref 8.9–10.3)
Chloride: 112 mmol/L — ABNORMAL HIGH (ref 98–111)
Creatinine, Ser: 1.22 mg/dL — ABNORMAL HIGH (ref 0.44–1.00)
GFR, Estimated: 45 mL/min — ABNORMAL LOW (ref >60)
Glucose, Bld: 94 mg/dL (ref 70–99)
Potassium: 4 mmol/L (ref 3.5–5.1)
Sodium: 144 mmol/L (ref 135–145)

## 2021-08-17 LAB — CEA: CEA: 3.7 ng/mL (ref 0.0–4.7)

## 2021-08-17 NOTE — Progress Notes (Signed)
PROGRESS NOTE    Tina Waller  WNI:[id number] DOB: 1941-05-14 DOA: 08/10/2021 PCP: [name], MD    Brief Narrative:  Tina Waller is a 81 y.o. F with pHTN, HTN, CKD IIIb, hx stroke on Plavix, and hyperparathyoidism who presented with melena, cough.  In the ER, found to have COVID, also hemoglobin trending down.  Started on PPI and admitted.   2/1 son at bedside.  She is trying to drink the GI prep.   2/2 plan for EGD and C-scope today.  Patient currently n.p.o. 2/4 no overnight issues .  Denies shortness of breath but states has cough but cant bring any sputum up..  Currently on 3 L satting 97 to 99%.   Consultants:  GI  Procedures:   Antimicrobials:      Subjective: Ate breakfast.  No abdominal pain nausea or vomiting  Objective: Vitals:   08/16/21 1655 08/16/21 2211 08/17/21 0605 08/17/21 0909  BP: 133/64 137/62 (!) 144/61 (!) 151/65  Pulse: 81 94 81 83  Resp: '17 15 14 18  ' Temp: 98.9 F (37.2 C) 98.6 F (37 C) 99.3 F (37.4 C) 98.8 F (37.1 C)  TempSrc:   Oral Oral  SpO2: 96% 98% 97% 99%  Weight:      Height:        Intake/Output Summary (Last 24 hours) at 08/17/2021 1212 Last data filed at 08/17/2021 1053 Gross per 24 hour  Intake 1014.64 ml  Output 300 ml  Net 714.64 ml   Filed Weights   08/11/21 0359  Weight: 58 kg    Examination: Calm, NAD Cta no w/r Reg s1/s2 no gallop Soft benign +bs No edema Aaoxox3  Mood and affect appropriate in current setting   Data Reviewed: I have personally reviewed following labs and imaging studies  CBC: Recent Labs  Lab 08/10/21 2310 08/11/21 0413 08/11/21 1659 08/12/21 0620 08/13/21 0439 08/14/21 0840  WBC 4.4 3.7*  --   --   --  4.6  HGB 13.7 11.4* 11.7* 10.8* 10.6* 10.7*  HCT 39.6 32.8* 34.2* 31.4* 31.3* 32.1*  MCV 97.1 96.2  --   --   --  99.1  PLT 177 149*  --   --   --  381   Basic Metabolic Panel: Recent Labs  Lab 08/10/21 2310 08/11/21 0413 08/14/21 0840 08/16/21 0401  08/17/21 0119  NA 138 137 146* 144 144  K 3.5 3.4* 3.6  --  4.0  CL 100 108 115*  --  112*  CO2 18* 20* 20*  --  23  GLUCOSE 107* 93 89  --  94  BUN 43* 38* 10  --  7*  CREATININE 2.22* 1.83* 1.25*  --  1.22*  CALCIUM 8.3* 7.8* 8.6*  --  8.7*   GFR: Estimated Creatinine Clearance: 34.8 mL/min (A) (by C-G formula based on SCr of 1.22 mg/dL (H)). Liver Function Tests: Recent Labs  Lab 08/10/21 2310 08/14/21 0840  AST 26 23  ALT 13 11  ALKPHOS 74 51  BILITOT 0.7 0.7  PROT 7.2 5.7*  ALBUMIN 3.6 2.6*   No results for input(s): LIPASE, AMYLASE in the last 168 hours. No results for input(s): AMMONIA in the last 168 hours. Coagulation Profile: No results for input(s): INR, PROTIME in the last 168 hours. Cardiac Enzymes: No results for input(s): CKTOTAL, CKMB, CKMBINDEX, TROPONINI in the last 168 hours. BNP (last 3 results) No results for input(s): PROBNP in the last 8760 hours. HbA1C: No results for input(s): HGBA1C in  the last 72 hours. CBG: No results for input(s): GLUCAP in the last 168 hours. Lipid Profile: No results for input(s): CHOL, HDL, LDLCALC, TRIG, CHOLHDL, LDLDIRECT in the last 72 hours. Thyroid Function Tests: No results for input(s): TSH, T4TOTAL, FREET4, T3FREE, THYROIDAB in the last 72 hours. Anemia Panel: No results for input(s): VITAMINB12, FOLATE, FERRITIN, TIBC, IRON, RETICCTPCT in the last 72 hours. Sepsis Labs: No results for input(s): PROCALCITON, LATICACIDVEN in the last 168 hours.  Recent Results (from the past 240 hour(s))  Resp Panel by RT-PCR (Flu A&B, Covid) Nasopharyngeal Swab     Status: Abnormal   Collection Time: 08/10/21 10:38 PM   Specimen: Nasopharyngeal Swab; Nasopharyngeal(NP) swabs in vial transport medium  Result Value Ref Range Status   SARS Coronavirus 2 by RT PCR POSITIVE (A) NEGATIVE Final    Comment: (NOTE) SARS-CoV-2 target nucleic acids are DETECTED.  The SARS-CoV-2 RNA is generally detectable in upper  respiratory specimens during the acute phase of infection. Positive results are indicative of the presence of the identified virus, but do not rule out bacterial infection or co-infection with other pathogens not detected by the test. Clinical correlation with patient history and other diagnostic information is necessary to determine patient infection status. The expected result is Negative.  Fact Sheet for Patients: EntrepreneurPulse.com.au  Fact Sheet for Healthcare Providers: IncredibleEmployment.be  This test is not yet approved or cleared by the [place] FDA and  has been authorized for detection and/or diagnosis of SARS-CoV-2 by FDA under an Emergency Use Authorization (EUA).  This EUA will remain in effect (meaning this test can be used) for the duration of  the COVID-19 declaration under Section 564(b)(1) of the A ct, 21 U.S.C. section 360bbb-3(b)(1), unless the authorization is terminated or revoked sooner.     Influenza A by PCR NEGATIVE NEGATIVE Final   Influenza B by PCR NEGATIVE NEGATIVE Final    Comment: (NOTE) The Xpert Xpress SARS-CoV-2/FLU/RSV plus assay is intended as an aid in the diagnosis of influenza from Nasopharyngeal swab specimens and should not be used as a sole basis for treatment. Nasal washings and aspirates are unacceptable for Xpert Xpress SARS-CoV-2/FLU/RSV testing.  Fact Sheet for Patients: EntrepreneurPulse.com.au  Fact Sheet for Healthcare Providers: IncredibleEmployment.be  This test is not yet approved or cleared by the [place] FDA and has been authorized for detection and/or diagnosis of SARS-CoV-2 by FDA under an Emergency Use Authorization (EUA). This EUA will remain in effect (meaning this test can be used) for the duration of the COVID-19 declaration under Section 564(b)(1) of the Act, 21 U.S.C. section 360bbb-3(b)(1), unless the authorization is  terminated or revoked.  Performed at [facility], [place] [address] [name], [place] [address]          Radiology Studies: CT CHEST ABDOMEN PELVIS W CONTRAST  Result Date: 08/16/2021 CLINICAL DATA:  Rectal cancer staging.  GI bleed. EXAM: CT CHEST, ABDOMEN, AND PELVIS WITH CONTRAST TECHNIQUE: Multidetector CT imaging of the chest, abdomen and pelvis was performed following the standard protocol during bolus administration of intravenous contrast. RADIATION DOSE REDUCTION: This exam was performed according to the departmental dose-optimization program which includes automated exposure control, adjustment of the mA and/or kV according to patient size and/or use of iterative reconstruction technique. CONTRAST:  193m OMNIPAQUE IOHEXOL 300 MG/ML  SOLN COMPARISON:  Abdominal ultrasound 08/12/2021, chest radiograph 08/13/2021 FINDINGS: CT CHEST FINDINGS Cardiovascular: Coronary, aortic arch, and branch vessel atherosclerotic vascular disease. Mediastinum/Nodes: 0.8 by 1.4 cm structure with internal density  of 13 Hounsfield units which is at or near fluid density along the anterior pericardial margin, image 35 series 3, potentially a small pericardial cyst or an anterior pericardial lymph node. This is not felt to be pathologically enlarged. Lungs/Pleura: Small bilateral pleural effusions with passive atelectasis. Severe centrilobular emphysema. Biapical pleuroparenchymal scarring. Musculoskeletal: Mild dextroconvex thoracic scoliosis. CT ABDOMEN PELVIS FINDINGS Hepatobiliary: Nonspecific 0.6 by 0.5 cm hypodense lesion in segment 4a of the liver on image 53 series 3. Questionable 3 by 4 mm hypodense lesion in segment 3 of the liver on image 51 series 5. Contracted gallbladder. No biliary dilatation. Pancreas: 0.9 by 0.6 by 0.7 cm hypodense lesion anteriorly in the pancreatic body on image 61 series 3. Spleen: Unremarkable Adrenals/Urinary Tract: Bilateral hypodense renal lesions favoring cysts, some of  these are too small to characterize and we do not have precontrast images to compare. Adrenal glands unremarkable. Complex 0.9 cm lesion of the right kidney upper pole medially on image 95 series 5, possibly a complex cyst or a small mass. Urinary bladder unremarkable. There is some localized left perirenal fluid particularly adjacent to a left kidney lower pole cyst for example on image 67 series 3, significance uncertain but local inflammation cannot be excluded. Some of this edema tracks along the left paracolic gutter. Stomach/Bowel: Abnormal indistinctly marginated mid to lower rectal wall thickening, image 88 series 6, compatible with a large rectal mass. Scattered sigmoid colon diverticula. No dilated bowel noted. Vascular/Lymphatic: Prominent systemic atherosclerosis. This includes substantial plaque for example at the origin of the SMA and renal arteries and elsewhere. If further workup of the patient's atherosclerosis is indicated, CT angiography would be recommended. I do not observe pathologic pelvic adenopathy. Reproductive: Uterus absent.  Adnexa unremarkable. Other: No supplemental non-categorized findings. Musculoskeletal: Mild levoconvex lumbar scoliosis. Bilateral degenerative hip arthropathy. Grade 1 degenerative anterolisthesis at L4-5. IMPRESSION: 1. Large rectal mass. Two tiny hypodense lesions in the liver are nonspecific with regard to benign etiology or metastatic disease. 2. 9 mm in long axis hypodense pancreatic body lesion. Possibilities include postinflammatory cyst, intraductal papillary mucinous neoplasm, or less likely pancreatic adenocarcinoma. 3. Bilateral renal cysts. A complex right kidney upper pole lesion is indeterminate for complex cyst versus mass and measures 9 mm in diameter. 4. In light of these findings, consider upper abdominal MRI with contrast to further characterize the hepatic, pancreatic, and renal lesions. 5. There is also unusual left perirenal stranding  especially adjacent to a cyst along the left kidney lower pole, this could be an indicator of inflammation involving the left kidney but is technically nonspecific. Some of this edema tracks along the left paracolic gutter. 6. Advanced systemic atherosclerosis. This includes aortic and coronary atherosclerosis along with mesenteric and renal atherosclerosis. If further workup of the patient's severe atherosclerotic vascular disease is warranted, CT angiography would be recommended. 7. Small bilateral pleural effusions with passive atelectasis. 8. Severe centrilobular emphysema.  Emphysema (ICD10-J43.9). 9. Mild thoracolumbar scoliosis. 10. Scattered sigmoid colon diverticula. Electronically Signed   By: Van Clines M.D.   On: 08/16/2021 15:53        Scheduled Meds:  amLODipine  10 mg Oral Daily   benzonatate  100 mg Oral BID   feeding supplement  237 mL Oral TID BM   isosorbide mononitrate  90 mg Oral Daily   metoprolol succinate  100 mg Oral Daily   multivitamin with minerals  1 tablet Oral Daily   pantoprazole  40 mg Oral BID   peg 3350 powder  0.5 kit Oral Once   pravastatin  80 mg Oral Daily   sodium chloride flush  3 mL Intravenous Q12H   Continuous Infusions:    Assessment & Plan:   Principal Problem:   GI bleed Active Problems:   History of CVA (cerebrovascular accident)   HTN (hypertension)   Insomnia   COVID-19 virus infection   Acute renal failure superimposed on stage 3 chronic kidney disease (HCC)   Thrombocytopenia (HCC)   GI bleeding   Acute gastrointestinal hemorrhage, suspected upper GI bleed Acute blood loss anemia Hgb down to 10 g/dL, now stable, no further melena.   Iron stores normal. 2/3 status post EGD and colonoscopy on 2/2  Found with 4 rectosigmoid colon polyps.  Also found with rectal mass which was biopsied.  Depending on path report we will decide whether it needs oncology involvement versus surgery. 2/4 CEA 3.7 CT A/P with large rectal  mass ,tiny lesions in the liver benign versus metastatic.  Please see full report of CT scan.  We will discuss CT scan findings with GI Continue to hold Plavix for few days per GIs request     Acute kidney injury on Chronic kidney disease stage IIIb Baseline Cr 1.4-1.7.  2/3 prerenal improved with IV fluids 2/4 creatinine close to baseline Avoid nephrotoxic meds    COVID infection Outside window for Paxlovid (symptoms started >7 days PTA), 2/2 asymptomatic currently Continue airborne precautions 2/4 wean off O2 as tolerated to keep O2 sat above 92%        Hypertension Cerebrovascular disease Pulmonary hypertension Stable  continue metoprolol and amlodipine       Hyperparathyroidism Follow-up with PCP as outpatient           DVT prophylaxis: SCD Code Status: Full Family Communication: None at bedside Disposition Plan: Home Status is: Inpatient Remains inpatient appropriate because: IV treatment.  Work-up pending Planned Discharge Destination: Home with Home Health              LOS: 5 days   Time spent: 35 minutes with more than 50% on [place], MD [facility] Pager [phone]  If 7PM-7AM, please contact night-coverage 08/17/2021, 12:12 PM

## 2021-08-18 ENCOUNTER — Inpatient Hospital Stay (HOSPITAL_COMMUNITY): Payer: Medicare HMO

## 2021-08-18 DIAGNOSIS — K922 Gastrointestinal hemorrhage, unspecified: Secondary | ICD-10-CM | POA: Diagnosis not present

## 2021-08-18 MED ORDER — GADOBUTROL 1 MMOL/ML IV SOLN
6.0000 mL | Freq: Once | INTRAVENOUS | Status: AC | PRN
  Administered 2021-08-18: 6 mL via INTRAVENOUS

## 2021-08-18 NOTE — Progress Notes (Signed)
PROGRESS NOTE    Tina Waller  IEP:[id number] DOB: July 28, 1940 DOA: 08/10/2021 PCP: [name], MD    Brief Narrative:  Tina Waller is a 81 y.o. F with pHTN, HTN, CKD IIIb, hx stroke on Plavix, and hyperparathyoidism who presented with melena, cough.  In the ER, found to have COVID, also hemoglobin trending down.  Started on PPI and admitted.   2/1 son at bedside.  She is trying to drink the GI prep.   2/2 plan for EGD and C-scope today.  Patient currently n.p.o. 2/4 no overnight issues .  Denies shortness of breath but states has cough but cant bring any sputum up..  Currently on 3 L satting 97 to 99%. 2/5 patient complaining her bottom is hurting laying in bed.  Told her I will have someone help her to sitting in chair.  No other complaints   Consultants:  GI  Procedures:   Antimicrobials:      Subjective: Shortness of breath, chest pain, nausea or vomiting.  Ate breakfast.  Objective: Vitals:   08/17/21 1818 08/17/21 2021 08/18/21 0530 08/18/21 0941  BP: 138/64 130/69 138/65 (!) 145/60  Pulse: 84 87 81 84  Resp: '16 19 18 16  ' Temp: 99.8 F (37.7 C) 99.2 F (37.3 C) 98.7 F (37.1 C) 98.8 F (37.1 C)  TempSrc:   Oral Oral  SpO2: 96% 94% 93% 97%  Weight:      Height:        Intake/Output Summary (Last 24 hours) at 08/18/2021 1017 Last data filed at 08/17/2021 2029 Gross per 24 hour  Intake 715 ml  Output --  Net 715 ml   Filed Weights   08/11/21 0359  Weight: 58 kg    Examination: Calm, NAD Decreased breath sounds no wheezing Reg s1/s2 no gallop Soft benign +bs No edema Aaoxox3  Mood and affect appropriate in current setting   Data Reviewed: I have personally reviewed following labs and imaging studies  CBC: Recent Labs  Lab 08/11/21 1659 08/12/21 0620 08/13/21 0439 08/14/21 0840  WBC  --   --   --  4.6  HGB 11.7* 10.8* 10.6* 10.7*  HCT 34.2* 31.4* 31.3* 32.1*  MCV  --   --   --  99.1  PLT  --   --   --  660   Basic  Metabolic Panel: Recent Labs  Lab 08/14/21 0840 08/16/21 0401 08/17/21 0119  NA 146* 144 144  K 3.6  --  4.0  CL 115*  --  112*  CO2 20*  --  23  GLUCOSE 89  --  94  BUN 10  --  7*  CREATININE 1.25*  --  1.22*  CALCIUM 8.6*  --  8.7*   GFR: Estimated Creatinine Clearance: 34.8 mL/min (A) (by C-G formula based on SCr of 1.22 mg/dL (H)). Liver Function Tests: Recent Labs  Lab 08/14/21 0840  AST 23  ALT 11  ALKPHOS 51  BILITOT 0.7  PROT 5.7*  ALBUMIN 2.6*   No results for input(s): LIPASE, AMYLASE in the last 168 hours. No results for input(s): AMMONIA in the last 168 hours. Coagulation Profile: No results for input(s): INR, PROTIME in the last 168 hours. Cardiac Enzymes: No results for input(s): CKTOTAL, CKMB, CKMBINDEX, TROPONINI in the last 168 hours. BNP (last 3 results) No results for input(s): PROBNP in the last 8760 hours. HbA1C: No results for input(s): HGBA1C in the last 72 hours. CBG: No results for input(s): GLUCAP in the last  168 hours. Lipid Profile: No results for input(s): CHOL, HDL, LDLCALC, TRIG, CHOLHDL, LDLDIRECT in the last 72 hours. Thyroid Function Tests: No results for input(s): TSH, T4TOTAL, FREET4, T3FREE, THYROIDAB in the last 72 hours. Anemia Panel: No results for input(s): VITAMINB12, FOLATE, FERRITIN, TIBC, IRON, RETICCTPCT in the last 72 hours. Sepsis Labs: No results for input(s): PROCALCITON, LATICACIDVEN in the last 168 hours.  Recent Results (from the past 240 hour(s))  Resp Panel by RT-PCR (Flu A&B, Covid) Nasopharyngeal Swab     Status: Abnormal   Collection Time: 08/10/21 10:38 PM   Specimen: Nasopharyngeal Swab; Nasopharyngeal(NP) swabs in vial transport medium  Result Value Ref Range Status   SARS Coronavirus 2 by RT PCR POSITIVE (A) NEGATIVE Final    Comment: (NOTE) SARS-CoV-2 target nucleic acids are DETECTED.  The SARS-CoV-2 RNA is generally detectable in upper respiratory specimens during the acute phase of infection.  Positive results are indicative of the presence of the identified virus, but do not rule out bacterial infection or co-infection with other pathogens not detected by the test. Clinical correlation with patient history and other diagnostic information is necessary to determine patient infection status. The expected result is Negative.  Fact Sheet for Patients: EntrepreneurPulse.com.au  Fact Sheet for Healthcare Providers: IncredibleEmployment.be  This test is not yet approved or cleared by the [place] FDA and  has been authorized for detection and/or diagnosis of SARS-CoV-2 by FDA under an Emergency Use Authorization (EUA).  This EUA will remain in effect (meaning this test can be used) for the duration of  the COVID-19 declaration under Section 564(b)(1) of the A ct, 21 U.S.C. section 360bbb-3(b)(1), unless the authorization is terminated or revoked sooner.     Influenza A by PCR NEGATIVE NEGATIVE Final   Influenza B by PCR NEGATIVE NEGATIVE Final    Comment: (NOTE) The Xpert Xpress SARS-CoV-2/FLU/RSV plus assay is intended as an aid in the diagnosis of influenza from Nasopharyngeal swab specimens and should not be used as a sole basis for treatment. Nasal washings and aspirates are unacceptable for Xpert Xpress SARS-CoV-2/FLU/RSV testing.  Fact Sheet for Patients: EntrepreneurPulse.com.au  Fact Sheet for Healthcare Providers: IncredibleEmployment.be  This test is not yet approved or cleared by the [place] FDA and has been authorized for detection and/or diagnosis of SARS-CoV-2 by FDA under an Emergency Use Authorization (EUA). This EUA will remain in effect (meaning this test can be used) for the duration of the COVID-19 declaration under Section 564(b)(1) of the Act, 21 U.S.C. section 360bbb-3(b)(1), unless the authorization is terminated or revoked.  Performed at [facility],  [place] [address] [place], [place] [address]          Radiology Studies: CT CHEST ABDOMEN PELVIS W CONTRAST  Result Date: 08/16/2021 CLINICAL DATA:  Rectal cancer staging.  GI bleed. EXAM: CT CHEST, ABDOMEN, AND PELVIS WITH CONTRAST TECHNIQUE: Multidetector CT imaging of the chest, abdomen and pelvis was performed following the standard protocol during bolus administration of intravenous contrast. RADIATION DOSE REDUCTION: This exam was performed according to the departmental dose-optimization program which includes automated exposure control, adjustment of the mA and/or kV according to patient size and/or use of iterative reconstruction technique. CONTRAST:  163m OMNIPAQUE IOHEXOL 300 MG/ML  SOLN COMPARISON:  Abdominal ultrasound 08/12/2021, chest radiograph 08/13/2021 FINDINGS: CT CHEST FINDINGS Cardiovascular: Coronary, aortic arch, and branch vessel atherosclerotic vascular disease. Mediastinum/Nodes: 0.8 by 1.4 cm structure with internal density of 13 Hounsfield units which is at or near fluid density along the  anterior pericardial margin, image 35 series 3, potentially a small pericardial cyst or an anterior pericardial lymph node. This is not felt to be pathologically enlarged. Lungs/Pleura: Small bilateral pleural effusions with passive atelectasis. Severe centrilobular emphysema. Biapical pleuroparenchymal scarring. Musculoskeletal: Mild dextroconvex thoracic scoliosis. CT ABDOMEN PELVIS FINDINGS Hepatobiliary: Nonspecific 0.6 by 0.5 cm hypodense lesion in segment 4a of the liver on image 53 series 3. Questionable 3 by 4 mm hypodense lesion in segment 3 of the liver on image 51 series 5. Contracted gallbladder. No biliary dilatation. Pancreas: 0.9 by 0.6 by 0.7 cm hypodense lesion anteriorly in the pancreatic body on image 61 series 3. Spleen: Unremarkable Adrenals/Urinary Tract: Bilateral hypodense renal lesions favoring cysts, some of these are too small to characterize and we do not have precontrast  images to compare. Adrenal glands unremarkable. Complex 0.9 cm lesion of the right kidney upper pole medially on image 95 series 5, possibly a complex cyst or a small mass. Urinary bladder unremarkable. There is some localized left perirenal fluid particularly adjacent to a left kidney lower pole cyst for example on image 67 series 3, significance uncertain but local inflammation cannot be excluded. Some of this edema tracks along the left paracolic gutter. Stomach/Bowel: Abnormal indistinctly marginated mid to lower rectal wall thickening, image 88 series 6, compatible with a large rectal mass. Scattered sigmoid colon diverticula. No dilated bowel noted. Vascular/Lymphatic: Prominent systemic atherosclerosis. This includes substantial plaque for example at the origin of the SMA and renal arteries and elsewhere. If further workup of the patient's atherosclerosis is indicated, CT angiography would be recommended. I do not observe pathologic pelvic adenopathy. Reproductive: Uterus absent.  Adnexa unremarkable. Other: No supplemental non-categorized findings. Musculoskeletal: Mild levoconvex lumbar scoliosis. Bilateral degenerative hip arthropathy. Grade 1 degenerative anterolisthesis at L4-5. IMPRESSION: 1. Large rectal mass. Two tiny hypodense lesions in the liver are nonspecific with regard to benign etiology or metastatic disease. 2. 9 mm in long axis hypodense pancreatic body lesion. Possibilities include postinflammatory cyst, intraductal papillary mucinous neoplasm, or less likely pancreatic adenocarcinoma. 3. Bilateral renal cysts. A complex right kidney upper pole lesion is indeterminate for complex cyst versus mass and measures 9 mm in diameter. 4. In light of these findings, consider upper abdominal MRI with contrast to further characterize the hepatic, pancreatic, and renal lesions. 5. There is also unusual left perirenal stranding especially adjacent to a cyst along the left kidney lower pole, this could  be an indicator of inflammation involving the left kidney but is technically nonspecific. Some of this edema tracks along the left paracolic gutter. 6. Advanced systemic atherosclerosis. This includes aortic and coronary atherosclerosis along with mesenteric and renal atherosclerosis. If further workup of the patient's severe atherosclerotic vascular disease is warranted, CT angiography would be recommended. 7. Small bilateral pleural effusions with passive atelectasis. 8. Severe centrilobular emphysema.  Emphysema (ICD10-J43.9). 9. Mild thoracolumbar scoliosis. 10. Scattered sigmoid colon diverticula. Electronically Signed   By: Van Clines M.D.   On: 08/16/2021 15:53        Scheduled Meds:  amLODipine  10 mg Oral Daily   benzonatate  100 mg Oral BID   feeding supplement  237 mL Oral TID BM   isosorbide mononitrate  90 mg Oral Daily   metoprolol succinate  100 mg Oral Daily   multivitamin with minerals  1 tablet Oral Daily   pantoprazole  40 mg Oral BID   peg 3350 powder  0.5 kit Oral Once   pravastatin  80 mg Oral Daily  sodium chloride flush  3 mL Intravenous Q12H   Continuous Infusions:    Assessment & Plan:   Principal Problem:   GI bleed Active Problems:   History of CVA (cerebrovascular accident)   HTN (hypertension)   Insomnia   COVID-19 virus infection   Acute renal failure superimposed on stage 3 chronic kidney disease (HCC)   Thrombocytopenia (HCC)   GI bleeding   Acute gastrointestinal hemorrhage, suspected upper GI bleed Acute blood loss anemia Hgb down to 10 g/dL, now stable, no further melena.   Iron stores normal. 2/3 status post EGD and colonoscopy on 2/2  Found with 4 rectosigmoid colon polyps.  Also found with rectal mass which was biopsied.  Depending on path report we will decide whether it needs oncology involvement versus surgery. 2/4 CEA 3.7 CT A/P with large rectal mass ,tiny lesions in the liver benign versus metastatic.  Please see full  report of CT scan.  We will discuss CT scan findings with GI Continue to hold Plavix for few days per GIs request  2/5 discussed CT results with GI, will proceed with MRI to evaluate pancreatic lesion    Acute kidney injury on Chronic kidney disease stage IIIb Baseline Cr 1.4-1.7.  2/3 prerenal improved with IV fluids 2/5 renal function close to baseline Avoid nephrotoxic meds     COVID infection Outside window for Paxlovid (symptoms started >7 days PTA), 2/2 asymptomatic currently Continue airborne precautions 2/5 continue to wean down O2 as tolerated to keep O2 sats above 92%        Hypertension Cerebrovascular disease Pulmonary hypertension Stable  Continue metoprolol and amlodipine          Hyperparathyroidism Follow-up with PCP as outpatient           DVT prophylaxis: SCD Code Status: Full Family Communication: None at bedside Disposition Plan: Home Status is: Inpatient Remains inpatient appropriate because: IV treatment.  Work-up pending Planned Discharge Destination: Home with Home Health              LOS: 6 days   Time spent: 35 minutes with more than 50% on [place], MD [facility] Pager [phone]  If 7PM-7AM, please contact night-coverage 08/18/2021, 10:17 AM

## 2021-08-18 NOTE — Progress Notes (Addendum)
Progress Note   Subjective  Chief Complaint:Anemia  Patient has been having some numbness in her bottom. Has had poor appetite but attempting to eat.    Objective   Vital signs in last 24 hours: Temp:  [98.7 F (37.1 C)-99.8 F (37.7 C)] 98.8 F (37.1 C) (02/05 0941) Pulse Rate:  [81-102] 102 (02/05 1139) Resp:  [16-19] 16 (02/05 0941) BP: (126-145)/(60-69) 126/65 (02/05 1139) SpO2:  [93 %-97 %] 97 % (02/05 0941) Last BM Date: 08/18/21  General: Pleasant, comfortable.  Elderly.  Looks a bit frail. Heart: RRR. Chest: Decreased breath sounds bilaterally with cough, no increased work of breathing Abdomen: Soft, not tender.  No distention.  Active bowel sounds. Extremities: No CCE. Neuro/Psych: Oriented x3, pleasant, cooperative.  Intake/Output from previous day: 02/04 0701 - 02/05 0700 In: 835 [P.O.:835] Out: -  Intake/Output this shift: Total I/O In: 120 [P.O.:120] Out: -   Lab Results: No results for input(s): WBC, HGB, HCT, PLT in the last 72 hours.  BMET Recent Labs    08/16/21 0401 08/17/21 0119  NA 144 144  K  --  4.0  CL  --  112*  CO2  --  23  GLUCOSE  --  94  BUN  --  7*  CREATININE  --  1.22*  CALCIUM  --  8.7*   LFT No results for input(s): PROT, ALBUMIN, AST, ALT, ALKPHOS, BILITOT, BILIDIR, IBILI in the last 72 hours.  PT/INR No results for input(s): LABPROT, INR in the last 72 hours.  Studies/Results:  EGD 08/15/21 - 3 cm hiatal hernia. - A single non-bleeding angioectasia in the stomach. Treated with argon plasma coagulation (APC). Clipped - Non-bleeding linear gastric ulcers with a clean ulcer base (Forrest Class III). Gastritis. Biopsied. - No gross lesions in the duodenal bulb, in the first portion of the duodenum and in the second portion of the duodenum. Biopsied. Colonoscopy 08/15/21 - Palpable rectal mass found on digital rectal exam. - The examined portion of the ileum was normal. - Four 3 to 7 mm polyps at the recto-sigmoid  colon, in the descending colon and in the ascending colon, removed with a cold snare. Resected and retrieved. - Rule out malignancy, partially obstructing tumor in the proximal/middle rectum. Biopsied. Tattooed proximal to lesion. - Diverticulosis in the entire examined colon. - Normal mucosa in the entire examined colon otherwise. - Non-bleeding non-thrombosed external and internal hemorrhoids.   Impression/Plan:   Rectal Mass IDA EGD with nonbleeding angiectasia status post APC, clipped, nonbleeding gastric ulcers pending biopsy. Colonoscopy with palpable rectal mass that was biopsied but not endoscopically amendable to resection.  CEA was normal.  CT C/A/P shows large rectal mass with two tiny hypodense lesions in the liver that are nonspecific, 9 mm hypodense pancreatic body lesion.  - Follow up rectal mass biopsy results. GI will contact the patient about pathology results - Ordered MRI abdomen to better characterize liver and pancreatic body lesions - GI will sign off   Chronic Plavix on hold.   Last dose was 1/27 Continue to hold for a few days waiting for path Will need evaluation by cardiology of the risks/benefits of continuing plavix in the future.   Covid 19 +,  mild sxs, no anti-virals necessary.      Future Appointments  Date Time Provider [place]  08/20/2021  1:30 PM [name], MD [facility] None  10/17/2021  3:00 PM [facility] DX DEXA 1 [facility]      LOS:  6 days   Sharyn Creamer  08/18/2021, 4:06 PM

## 2021-08-19 ENCOUNTER — Encounter (HOSPITAL_COMMUNITY): Payer: Self-pay | Admitting: Gastroenterology

## 2021-08-19 DIAGNOSIS — E43 Unspecified severe protein-calorie malnutrition: Secondary | ICD-10-CM

## 2021-08-19 LAB — GLUCOSE, CAPILLARY: Glucose-Capillary: 103 mg/dL — ABNORMAL HIGH (ref 70–99)

## 2021-08-19 NOTE — Progress Notes (Addendum)
PROGRESS NOTE    Tina Waller  YKD:[id number] DOB: May 03, 1941 DOA: 08/10/2021 PCP: [name], MD    Brief Narrative:  Tina Waller is a 81 y.o. F with pHTN, HTN, CKD IIIb, hx stroke on Plavix, and hyperparathyoidism who presented with melena, cough.  In the ER, found to have COVID, also hemoglobin trending down.  Started on PPI and admitted.   2/1 son at bedside.  She is trying to drink the GI prep.   2/2 plan for EGD and C-scope today.  Patient currently n.p.o. 2/4 no overnight issues .  Denies shortness of breath but states has cough but cant bring any sputum up..  Currently on 3 L satting 97 to 99%. 2/5 patient complaining her bottom is hurting laying in bed.  Told her I will have someone help her to sitting in chair.  No other complaints   Consultants:  GI  Procedures:   Antimicrobials:      Subjective: Shortness of breath, chest pain, nausea or vomiting.  Ate breakfast.  Objective: Vitals:   08/18/21 1653 08/18/21 2211 08/19/21 0530 08/19/21 0909  BP: (!) 131/57 137/63 (!) 143/63 130/67  Pulse: 83 85 86 91  Resp: '18 18 18 18  ' Temp: 99 F (37.2 C) 99.1 F (37.3 C) 98.7 F (37.1 C) 99.1 F (37.3 C)  TempSrc: Oral Oral Oral Oral  SpO2: 99% 97% 95% 94%  Weight:      Height:        Intake/Output Summary (Last 24 hours) at 08/19/2021 1422 Last data filed at 08/18/2021 2200 Gross per 24 hour  Intake 475 ml  Output --  Net 475 ml   Filed Weights   08/11/21 0359  Weight: 58 kg    Examination: Calm, NAD Decreased breath sounds no wheezing Reg s1/s2 no gallop Soft benign +bs No edema Aaoxox3  Mood and affect appropriate in current setting   Data Reviewed: I have personally reviewed following labs and imaging studies  CBC: Recent Labs  Lab 08/13/21 0439 08/14/21 0840  WBC  --  4.6  HGB 10.6* 10.7*  HCT 31.3* 32.1*  MCV  --  99.1  PLT  --  397   Basic Metabolic Panel: Recent Labs  Lab 08/14/21 0840 08/16/21 0401 08/17/21 0119   NA 146* 144 144  K 3.6  --  4.0  CL 115*  --  112*  CO2 20*  --  23  GLUCOSE 89  --  94  BUN 10  --  7*  CREATININE 1.25*  --  1.22*  CALCIUM 8.6*  --  8.7*   GFR: Estimated Creatinine Clearance: 34.8 mL/min (A) (by C-G formula based on SCr of 1.22 mg/dL (H)). Liver Function Tests: Recent Labs  Lab 08/14/21 0840  AST 23  ALT 11  ALKPHOS 51  BILITOT 0.7  PROT 5.7*  ALBUMIN 2.6*   No results for input(s): LIPASE, AMYLASE in the last 168 hours. No results for input(s): AMMONIA in the last 168 hours. Coagulation Profile: No results for input(s): INR, PROTIME in the last 168 hours. Cardiac Enzymes: No results for input(s): CKTOTAL, CKMB, CKMBINDEX, TROPONINI in the last 168 hours. BNP (last 3 results) No results for input(s): PROBNP in the last 8760 hours. HbA1C: No results for input(s): HGBA1C in the last 72 hours. CBG: No results for input(s): GLUCAP in the last 168 hours. Lipid Profile: No results for input(s): CHOL, HDL, LDLCALC, TRIG, CHOLHDL, LDLDIRECT in the last 72 hours. Thyroid Function Tests: No results for  input(s): TSH, T4TOTAL, FREET4, T3FREE, THYROIDAB in the last 72 hours. Anemia Panel: No results for input(s): VITAMINB12, FOLATE, FERRITIN, TIBC, IRON, RETICCTPCT in the last 72 hours. Sepsis Labs: No results for input(s): PROCALCITON, LATICACIDVEN in the last 168 hours.  Recent Results (from the past 240 hour(s))  Resp Panel by RT-PCR (Flu A&B, Covid) Nasopharyngeal Swab     Status: Abnormal   Collection Time: 08/10/21 10:38 PM   Specimen: Nasopharyngeal Swab; Nasopharyngeal(NP) swabs in vial transport medium  Result Value Ref Range Status   SARS Coronavirus 2 by RT PCR POSITIVE (A) NEGATIVE Final    Comment: (NOTE) SARS-CoV-2 target nucleic acids are DETECTED.  The SARS-CoV-2 RNA is generally detectable in upper respiratory specimens during the acute phase of infection. Positive results are indicative of the presence of the identified virus, but do  not rule out bacterial infection or co-infection with other pathogens not detected by the test. Clinical correlation with patient history and other diagnostic information is necessary to determine patient infection status. The expected result is Negative.  Fact Sheet for Patients: EntrepreneurPulse.com.au  Fact Sheet for Healthcare Providers: IncredibleEmployment.be  This test is not yet approved or cleared by the [place] FDA and  has been authorized for detection and/or diagnosis of SARS-CoV-2 by FDA under an Emergency Use Authorization (EUA).  This EUA will remain in effect (meaning this test can be used) for the duration of  the COVID-19 declaration under Section 564(b)(1) of the A ct, 21 U.S.C. section 360bbb-3(b)(1), unless the authorization is terminated or revoked sooner.     Influenza A by PCR NEGATIVE NEGATIVE Final   Influenza B by PCR NEGATIVE NEGATIVE Final    Comment: (NOTE) The Xpert Xpress SARS-CoV-2/FLU/RSV plus assay is intended as an aid in the diagnosis of influenza from Nasopharyngeal swab specimens and should not be used as a sole basis for treatment. Nasal washings and aspirates are unacceptable for Xpert Xpress SARS-CoV-2/FLU/RSV testing.  Fact Sheet for Patients: EntrepreneurPulse.com.au  Fact Sheet for Healthcare Providers: IncredibleEmployment.be  This test is not yet approved or cleared by the [place] FDA and has been authorized for detection and/or diagnosis of SARS-CoV-2 by FDA under an Emergency Use Authorization (EUA). This EUA will remain in effect (meaning this test can be used) for the duration of the COVID-19 declaration under Section 564(b)(1) of the Act, 21 U.S.C. section 360bbb-3(b)(1), unless the authorization is terminated or revoked.  Performed at [facility], [place] [address] [place], [place] [address]          Radiology Studies: MR  ABDOMEN MRCP W WO CONTAST  Result Date: 08/19/2021 CLINICAL DATA:  81 year old female with history of liver and pancreatic lesions noted on recent CT examination. Rectal cancer. Follow-up study. EXAM: MRI ABDOMEN WITHOUT AND WITH CONTRAST (INCLUDING MRCP) TECHNIQUE: Multiplanar multisequence MR imaging of the abdomen was performed both before and after the administration of intravenous contrast. Heavily T2-weighted images of the biliary and pancreatic ducts were obtained, and three-dimensional MRCP images were rendered by post processing. CONTRAST:  61m GADAVIST GADOBUTROL 1 MMOL/ML IV SOLN COMPARISON:  No prior abdominal MRI. CT the chest, abdomen and pelvis 08/16/2021. FINDINGS: Lower chest: Small bilateral pleural effusions lying dependently with areas of increased signal intensity in the dependent portions of the lungs, poorly evaluated on today's MRI examination, but likely to represent areas of dependent subsegmental atelectasis. Hepatobiliary: Extensive susceptibility artifact centered in the region of the stomach completely obscures portions of the left lobe of the liver. With these  limitations in mind there is a 7 mm T1 hypointense, T2 hyperintense, nonenhancing lesion in segment 4A of the liver, compatible with a simple hepatic cyst. No other aggressive appearing hepatic lesions. No intra or extrahepatic biliary ductal dilatation. Amorphous high signal intensity lying dependently in the gallbladder on T1 weighted images, likely reflects some biliary sludge. Gallbladder is not distended. Gallbladder wall thickness appears normal. No pericholecystic fluid. Pancreas: Head and uncinate process of the pancreas are normal in appearance. Body and tail of the pancreas are largely obscured by beam hardening artifact emanating from the patient's stomach. Spleen:  Unremarkable. Adrenals/Urinary Tract: Multiple T1 hypointense, T2 hyperintense, nonenhancing lesions in both kidneys, compatible with simple cysts,  largest of which is exophytic in the interpolar region of the right kidney measuring up to 2.3 cm in diameter. No hydroureteronephrosis in the visualized portions of the abdomen. Bilateral adrenal glands are normal in appearance. Stomach/Bowel: Extensive susceptibility artifact in the stomach, presumably related to the large surgical clip noted on the recent CT examination. Other visualized portions of small bowel and colon are grossly unremarkable in appearance. Vascular/Lymphatic: Aortic atherosclerosis, without definite aneurysm in the abdominal vasculature. No lymphadenopathy noted in the abdomen. Other: No significant volume of ascites noted in the visualized portions of the peritoneal cavity. Musculoskeletal: No aggressive appearing osseous lesions are noted in the visualized portions of the skeleton. Increased T2 signal intensity in the right psoas muscle where there are well-defined centrally low signal intensity regions with some peripheral enhancement on post gadolinium imaging, concerning for right-sided psoas abscesses or intramuscular metastasis. The largest of these (demonstrated on axial image 64 of series 28) measures 1.2 x 0.9 cm. IMPRESSION: 1. Study is severely limited by beam hardening artifact from a large surgical clip in the stomach, which completely obscures the previously noted pancreatic lesion on most pulse sequences rendering today's study nondiagnostic. The only visualized hepatic lesion is compatible with a small simple hepatic cyst. 2. There is abnormal signal intensity and small rim enhancing lesions in the right psoas muscle. Clinical correlation for signs and symptoms of psoas abscess is recommended. Alternatively, the possibility of intramuscular metastasis could be considered. 3. Small bilateral pleural effusions lying dependently with areas of presumed passive atelectasis in the lower lobes of the lungs bilaterally. 4. Multiple simple cysts in the kidneys bilaterally. 5. Aortic  atherosclerosis. Electronically Signed   By: Vinnie Langton M.D.   On: 08/19/2021 08:23        Scheduled Meds:  amLODipine  10 mg Oral Daily   benzonatate  100 mg Oral BID   feeding supplement  237 mL Oral TID BM   isosorbide mononitrate  90 mg Oral Daily   metoprolol succinate  100 mg Oral Daily   multivitamin with minerals  1 tablet Oral Daily   pantoprazole  40 mg Oral BID   peg 3350 powder  0.5 kit Oral Once   pravastatin  80 mg Oral Daily   sodium chloride flush  3 mL Intravenous Q12H   Continuous Infusions:    Assessment & Plan:   Principal Problem:   GI bleed Active Problems:   History of CVA (cerebrovascular accident)   HTN (hypertension)   Insomnia   COVID-19 virus infection   Acute renal failure superimposed on stage 3 chronic kidney disease (HCC)   Thrombocytopenia (HCC)   GI bleeding   Protein-calorie malnutrition, severe   Acute gastrointestinal hemorrhage, suspected upper GI bleed Acute blood loss anemia Hgb down to 10 g/dL, now stable, no further  melena.   Iron stores normal. 2/3 status post EGD and colonoscopy on 2/2  Found with 4 rectosigmoid colon polyps.  Also found with rectal mass which was biopsied.  Depending on path report we will decide whether it needs oncology involvement versus surgery. 2/4 CEA 3.7 CT A/P with large rectal mass ,tiny lesions in the liver benign versus metastatic.  Please see full report of CT scan.  We will discuss CT scan findings with GI Continue to hold Plavix for few days per GIs request  2/6 MRI severely limited due to artifact. See full report Surgical path suspicious for intramucosal carcinoma Will consult oncology. Will also need colorectal surgery consult after oncology evaluates pt    Acute kidney injury on Chronic kidney disease stage IIIb Baseline Cr 1.4-1.7.  2/3 prerenal improved with IV fluids 2/6 renal function at baseline      COVID infection Outside window for Paxlovid (symptoms started >7  days PTA), 2/2 asymptomatic currently Continue airborne precautions 2/6 continue to wean down O2 as tolerated to keep O2 sats above 92%    Protein calorie malnutrition Due to chronic illness as evidenced by severe fat depletion and severe muscle depletion Nutrition consulted and following please see note      Hypertension Cerebrovascular disease Pulmonary hypertension Stable  Continue metoprolol and amlodipine          Hyperparathyroidism Follow-up with PCP as outpatient           DVT prophylaxis: SCD Code Status: Full Family Communication: None at bedside Disposition Plan: Home Status is: Inpatient Remains inpatient appropriate because: IV treatment.  Work-up pending Planned Discharge Destination: Home with Home Health              LOS: 7 days   Time spent: 35 minutes with more than 50% on [place], MD [facility] Pager [phone]  If 7PM-7AM, please contact night-coverage 08/19/2021, 2:22 PM

## 2021-08-19 NOTE — TOC Progression Note (Signed)
Transition of Care [facility]) - Initial/Assessment Note    Patient Details  Name: Tina Waller MRN: [id number] Date of Birth: 1940/11/08  [facility] CM/SW Contact:    Milinda Antis, [place] Phone Number: 08/19/2021, 10:12 AM  Clinical Narrative:                 CSW contacted [facility] with admissions at [facility] and requested that insurance auth be extended due to the patient not being medically ready to leave.  Expected Discharge Plan: Skilled Nursing Facility Barriers to Discharge: Continued Medical Work up, Ship broker, SNF Pending bed offer   Patient Goals and CMS Choice Patient states their goals for this hospitalization and ongoing recovery are:: To get better CMS Medicare.gov Compare Post Acute Care list provided to:: Patient Choice offered to / list presented to : Patient  Expected Discharge Plan and Services Expected Discharge Plan: [place]       Living arrangements for the past 2 months: Apartment                                      Prior Living Arrangements/Services Living arrangements for the past 2 months: Apartment Lives with:: Self, Relatives Patient language and need for interpreter reviewed:: Yes Do you feel safe going back to the place where you live?: Yes      Need for Family Participation in Patient Care: Yes (Comment) Care giver support system in place?: Yes (comment)   Criminal Activity/Legal Involvement Pertinent to Current Situation/Hospitalization: No - Comment as needed  Activities of Daily Living Home Assistive Devices/Equipment: Cane (specify quad or straight) ADL Screening (condition at time of admission) Patient's cognitive ability adequate to safely complete daily activities?: Yes Is the patient deaf or have difficulty hearing?: No Does the patient have difficulty seeing, even when wearing glasses/contacts?: No Does the patient have difficulty concentrating, remembering, or making  decisions?: No Patient able to express need for assistance with ADLs?: Yes Does the patient have difficulty dressing or bathing?: No Independently performs ADLs?: Yes (appropriate for developmental age) Does the patient have difficulty walking or climbing stairs?: No Weakness of Legs: Both Weakness of Arms/Hands: Both  Permission Sought/Granted   Permission granted to share information with : Yes, Verbal Permission Granted     Permission granted to share info w AGENCY: SNF        Emotional Assessment Appearance:: Appears older than stated age Attitude/Demeanor/Rapport: Engaged Affect (typically observed): Accepting Orientation: : Oriented to Place, Oriented to  Time, Oriented to Situation, Oriented to Self Alcohol / Substance Use: Not Applicable Psych Involvement: No (comment)  Admission diagnosis:  GI bleed [K92.2] Acute GI bleeding [K92.2] COVID-19 [U07.1] GI bleeding [K92.2] Patient Active Problem List   Diagnosis Date Noted   GI bleeding 08/12/2021   GI bleed 08/11/2021   COVID-19 virus infection 08/11/2021   Acute renal failure superimposed on stage 3 chronic kidney disease ([place]) 08/11/2021   Thrombocytopenia (HCC)    Cellulitis of left lower extremity 04/20/2020   Elevated alkaline phosphatase level 07/28/2019   Microscopic hematuria 09/24/2018   Rectal bleeding 09/24/2018   Insomnia 09/24/2018   Anxiety 09/14/2018   Primary hyperparathyroidism ([place]) 02/10/2018   Vitamin B 12 deficiency 12/11/2017   Pulmonary hypertension, mild ([place]) 12/11/2017   CKD (chronic kidney disease) stage 3, GFR 30-59 ml/min ([place]) 04/17/2017   Stasis dermatitis of both legs 04/17/2017   Osteoporosis 12/22/2016  Hx of gout 12/22/2016   Estrogen deficiency 05/12/2016   Vitamin D deficiency 05/12/2016   Weight loss 05/12/2016   HTN (hypertension) 06/12/2015   History of CVA (cerebrovascular accident) 03/28/2015   PCP:  [name], MD Pharmacy:   [facility] [address] ([address]), [place] - [place] [address] [place] ([place]) [place] [address] Phone: [phone] Fax: [phone]  [facility] [place], [place] [address] Phone: [phone] Fax: [phone]     Social Determinants of Health (SDOH) Interventions    Readmission Risk Interventions No flowsheet data found.

## 2021-08-19 NOTE — Consult Note (Addendum)
[place]  [phone] [phone]   MEDICAL ONCOLOGY - INITIAL CONSULTATION  Referral MD: Dr. 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 Tallman is a 81 year old female with a past medical history significant for venous stasis, pulmonary hypertension, hyperparathyroidism, insomnia, anxiety, depression, gout, history of CVA, CKD stage III, diabetes mellitus.  She presented to the emergency department with weakness and black stools.  She had a 4-day history of black stools.  No frank blood noted.  Labs on admission showed a creatinine of 2.22, BUN 43, calcium 8.3, CBC within normal limits.  Stool for occult blood positive.  She was also found to be positive for COVID-19.  She was seen by GI and underwent colonoscopy and EGD on 08/15/2021.  Colonoscopy showed a palpable rectal mass and a partially obstructing tumor in the proximal/middle rectum.  This was biopsied and it showed fragments of an adenoma with multifocal high-grade dysplasia and focally suspicious for intramucosal carcinoma.  EGD showed no gross lesions in the esophagus, a single nonbleeding angioectasia in the stomach which was treated, nonbleeding linear gastric ulcers with clean ulcer base which were biopsied.  Stomach biopsy and duodenal biopsy negative for malignancy.  She had a CT of the chest/abdomen/pelvis with contrast performed on 08/16/2021 which showed a large rectal mass, 2 tiny hypodense lesions in the liver which are nonspecific, 9 mm hypodense pancreatic body lesion which could be a postinflammatory cyst, intraductal papillary mucinous neoplasm, or less likely pancreatic adenocarcinoma.  An MRCP was performed on 08/18/2021 and the study was severely limited by beam hardening artifact from a large surgical clip in the stomach which completely obscures the previously noted pancreatic lesion and the only visualized hepatic lesion is compatible with a small simple  hepatic cyst.  Additionally, MRI showed abnormal signal intensity and small rim enhancing lesions in the right psoas muscle which raises the possibility of psoas abscess versus intramuscular metastasis. CEA obtained on 08/16/2021 was normal at 3.7.  No family at the bedside.  The patient reports that she is not having any abdominal pain, nausea, vomiting.  She has not seen any more black stools.  Denies difficulty passing stools.  She reports that her appetite has been decreased recently.  Has lost about 6 pounds over the past 3 months.  She currently has a cough that is nonproductive.  Has some mild shortness of breath.  Currently on O2 at 2 L/min.  She is not having any fevers or chills.  Denies headaches or dizziness.  Denies chest pain.  The patient is divorced.  She has 1 child that lives in [place].  She previously smoked cigarettes but quit in 2008.  Reports that she drinks mixed drinks several times per week.  Denies family history of malignancy.  Medical oncology was asked see the patient make recommendations regarding her newly diagnosed rectal cancer.  Past Medical History:  Diagnosis Date   Anemia    Anxiety    Cataract    Depression    Diabetes mellitus without complication ([place])    pt denies   GERD (gastroesophageal reflux disease)    Gout    Headache    Hypertension    Stroke [facility]) 2008   09/14/2018   Tonsillar mass    Type 2 diabetes mellitus ([place]) 09/14/2018   Vertigo    Wears dentures    upper   Wears glasses   :  Past Surgical History:  Procedure Laterality Date  ABDOMINAL HYSTERECTOMY     BIOPSY  08/15/2021   Procedure: BIOPSY;  Surgeon: Rush Landmark Telford Nab., MD;  Location: [place];  Service: Gastroenterology;;   BREAST SURGERY     lumpectomy   BUNIONECTOMY     CATARACT EXTRACTION Left    COLONOSCOPY W/ BIOPSIES AND POLYPECTOMY     COLONOSCOPY WITH PROPOFOL N/A 08/15/2021   Procedure: COLONOSCOPY WITH PROPOFOL;  Surgeon: Irving Copas., MD;  Location:  [place];  Service: Gastroenterology;  Laterality: N/A;   ESOPHAGOGASTRODUODENOSCOPY (EGD) WITH PROPOFOL N/A 08/15/2021   Procedure: ESOPHAGOGASTRODUODENOSCOPY (EGD) WITH PROPOFOL;  Surgeon: Rush Landmark Telford Nab., MD;  Location: [place];  Service: Gastroenterology;  Laterality: N/A;   HEMOSTASIS CLIP PLACEMENT  08/15/2021   Procedure: HEMOSTASIS CLIP PLACEMENT;  Surgeon: Irving Copas., MD;  Location: [place];  Service: Gastroenterology;;   HOT HEMOSTASIS N/A 08/15/2021   Procedure: HOT HEMOSTASIS (ARGON PLASMA COAGULATION/BICAP);  Surgeon: Irving Copas., MD;  Location: [place];  Service: Gastroenterology;  Laterality: N/A;   MULTIPLE TOOTH EXTRACTIONS     POLYPECTOMY  08/15/2021   Procedure: POLYPECTOMY;  Surgeon: Mansouraty, Telford Nab., MD;  Location: [place];  Service: Gastroenterology;;   SUBMUCOSAL TATTOO INJECTION  08/15/2021   Procedure: SUBMUCOSAL TATTOO INJECTION;  Surgeon: Irving Copas., MD;  Location: [place];  Service: Gastroenterology;;   TONGUE BIOPSY Right 12/13/2018   Procedure: RIGHT TONSIL BIOPSY;  Surgeon: Melida Quitter, MD;  Location: [place];  Service: ENT;  Laterality: Right;  :  Current Facility-Administered Medications  Medication Dose Route Frequency Provider Last Rate Last Admin   acetaminophen (TYLENOL) tablet 650 mg  650 mg Oral Q6H PRN Marcelyn Bruins, MD   650 mg at 08/19/21 4854   Or   acetaminophen (TYLENOL) suppository 650 mg  650 mg Rectal Q6H PRN Marcelyn Bruins, MD       albuterol (VENTOLIN HFA) 108 (90 Base) MCG/ACT inhaler 2 puff  2 puff Inhalation Q4H PRN Chotiner, Yevonne Aline, MD       amLODipine (NORVASC) tablet 10 mg  10 mg Oral Daily Marcelyn Bruins, MD   10 mg at 08/19/21 0946   benzonatate (TESSALON) capsule 100 mg  100 mg Oral BID Nolberto Hanlon, MD   100 mg at 08/19/21 0946   feeding supplement (ENSURE ENLIVE / ENSURE PLUS) liquid 237 mL  237 mL Oral TID BM Barb Merino, MD   237 mL at  08/19/21 0949   isosorbide mononitrate (IMDUR) 24 hr tablet 90 mg  90 mg Oral Daily Marcelyn Bruins, MD   90 mg at 08/19/21 0945   metoprolol succinate (TOPROL-XL) 24 hr tablet 100 mg  100 mg Oral Daily Marcelyn Bruins, MD   100 mg at 08/19/21 6270   multivitamin with minerals tablet 1 tablet  1 tablet Oral Daily Barb Merino, MD   1 tablet at 08/19/21 0946   ondansetron (ZOFRAN) injection 4 mg  4 mg Intravenous Q8H PRN Pham, Minh Q, RPH-CPP       pantoprazole (PROTONIX) EC tablet 40 mg  40 mg Oral BID Vena Rua, PA-C   40 mg at 08/19/21 0946   peg 3350 powder (MOVIPREP) kit 100 g  0.5 kit Oral Once Pham, Minh Q, RPH-CPP       pravastatin (PRAVACHOL) tablet 80 mg  80 mg Oral Daily Marcelyn Bruins, MD   80 mg at 08/19/21 0945   sodium chloride flush (NS) 0.9 % injection 3 mL  3 mL Intravenous Q12H Trilby Drummer,  Candace Gallus, MD   3 mL at 08/19/21 1105   traZODone (DESYREL) tablet 100 mg  100 mg Oral QHS PRN Marcelyn Bruins, MD   100 mg at 08/18/21 2128   Allergies  Allergen Reactions   Lisinopril Cough   Carvedilol Diarrhea   Hydralazine     Vomiting and diarrhea   Lipitor [Atorvastatin] Diarrhea  : Family History  Problem Relation Age of Onset   Heart disease Mother        CHF   Osteoporosis Neg Hx    Colon cancer Neg Hx    Stomach cancer Neg Hx    Pancreatic cancer Neg Hx   : Social History   Socioeconomic History   Marital status: Divorced    Spouse name: Not on file   Number of children: Not on file   Years of education: Not on file   Highest education level: Not on file  Occupational History   Not on file  Tobacco Use   Smoking status: Former    Types: Cigarettes    Quit date: 07/14/2006    Years since quitting: 15.1   Smokeless tobacco: Never  Vaping Use   Vaping Use: Never used  Substance and Sexual Activity   Alcohol use: Yes    Alcohol/week: 4.0 standard drinks    Types: 4 Shots of liquor per week    Comment: 4 drinks a week   Drug use: No    Sexual activity: Not Currently  Other Topics Concern   Not on file  Social History Narrative   Not on file   Social Determinants of Health   Financial Resource Strain: Not on file  Food Insecurity: Not on file  Transportation Needs: Not on file  Physical Activity: Not on file  Stress: Not on file  Social Connections: Not on file  Intimate Partner Violence: Not on file  :  Review of Systems: A comprehensive 14 point review of systems was negative except as noted in the HPI.  Exam: Patient Vitals for the past 24 hrs:  BP Temp Temp src Pulse Resp SpO2  08/19/21 0909 130/67 99.1 F (37.3 C) Oral 91 18 94 %  08/19/21 0530 (!) 143/63 98.7 F (37.1 C) Oral 86 18 95 %  08/18/21 2211 137/63 99.1 F (37.3 C) Oral 85 18 97 %  08/18/21 1653 (!) 131/57 99 F (37.2 C) Oral 83 18 99 %    General: Awake and alert, no distress Eyes:  no scleral icterus.   ENT:  There were no oropharyngeal lesions.     Lymphatics:  Negative cervical, supraclavicular or axillary adenopathy.   Respiratory: Decreased breath sounds.   Cardiovascular:  Regular rate and rhythm, S1/S2, without murmur, rub or gallop.  There was no pedal edema.   GI:  abdomen was soft, flat, nontender, nondistended, without organomegaly.     Skin exam was without echymosis, petichae.   Neuro exam was nonfocal. Patient was alert and oriented.  Attention was good.   Language was appropriate.  Mood was normal without depression.  Speech was not pressured.  Thought content was not tangential.     Lab Results  Component Value Date   WBC 4.6 08/14/2021   HGB 10.7 (L) 08/14/2021   HCT 32.1 (L) 08/14/2021   PLT 188 08/14/2021   GLUCOSE 94 08/17/2021   CHOL 166 07/26/2020   TRIG 122 07/26/2020   HDL 53 07/26/2020   LDLCALC 91 07/26/2020   ALT 11 08/14/2021   AST 23 08/14/2021  NA 144 08/17/2021   K 4.0 08/17/2021   CL 112 (H) 08/17/2021   CREATININE 1.22 (H) 08/17/2021   BUN 7 (L) 08/17/2021   CO2 23 08/17/2021    US  Abdomen Complete  Result Date: 08/12/2021 CLINICAL DATA:  Thrombocytopenia. EXAM: ABDOMEN ULTRASOUND COMPLETE COMPARISON:  Renal ultrasound 09/04/2017 FINDINGS: Gallbladder: No gallstones or gallbladder wall thickening. No pericholecystic fluid. The sonographer reports no sonographic Murphy's sign. Common bile duct: Diameter: 4 mm Liver: Tiny cyst noted right liver. Portal vein is patent on color Doppler imaging with normal direction of blood flow towards the liver. IVC: No abnormality visualized. Pancreas: Partially obscured by bowel gas. Spleen: Size and appearance within normal limits. 7.4 cm craniocaudal length. Right Kidney: Length: 9.5 cm 4-5 mm nonobstructing stone evident. 2.3 cm simple cyst. No hydronephrosis. Echogenicity diffusely increased. Left Kidney: Length: 9.9 cm. Diffusely increased echotexture. 2 cysts identified measuring up to 2.1 cm diameter. No hydronephrosis. Abdominal aorta: No aneurysm visualized. Other findings: None. IMPRESSION: 1. No evidence of splenomegaly. 2. Increased echotexture of renal parenchyma, a nonspecific finding which can be seen in medical renal disease. 3. 4-5 mm nonobstructing stone in the lower pole right kidney. Electronically Signed   By: Misty Stanley M.D.   On: 08/12/2021 07:48   CT CHEST ABDOMEN PELVIS W CONTRAST  Result Date: 08/16/2021 CLINICAL DATA:  Rectal cancer staging.  GI bleed. EXAM: CT CHEST, ABDOMEN, AND PELVIS WITH CONTRAST TECHNIQUE: Multidetector CT imaging of the chest, abdomen and pelvis was performed following the standard protocol during bolus administration of intravenous contrast. RADIATION DOSE REDUCTION: This exam was performed according to the departmental dose-optimization program which includes automated exposure control, adjustment of the mA and/or kV according to patient size and/or use of iterative reconstruction technique. CONTRAST:  164m OMNIPAQUE IOHEXOL 300 MG/ML  SOLN COMPARISON:  Abdominal ultrasound 08/12/2021, chest  radiograph 08/13/2021 FINDINGS: CT CHEST FINDINGS Cardiovascular: Coronary, aortic arch, and branch vessel atherosclerotic vascular disease. Mediastinum/Nodes: 0.8 by 1.4 cm structure with internal density of 13 Hounsfield units which is at or near fluid density along the anterior pericardial margin, image 35 series 3, potentially a small pericardial cyst or an anterior pericardial lymph node. This is not felt to be pathologically enlarged. Lungs/Pleura: Small bilateral pleural effusions with passive atelectasis. Severe centrilobular emphysema. Biapical pleuroparenchymal scarring. Musculoskeletal: Mild dextroconvex thoracic scoliosis. CT ABDOMEN PELVIS FINDINGS Hepatobiliary: Nonspecific 0.6 by 0.5 cm hypodense lesion in segment 4a of the liver on image 53 series 3. Questionable 3 by 4 mm hypodense lesion in segment 3 of the liver on image 51 series 5. Contracted gallbladder. No biliary dilatation. Pancreas: 0.9 by 0.6 by 0.7 cm hypodense lesion anteriorly in the pancreatic body on image 61 series 3. Spleen: Unremarkable Adrenals/Urinary Tract: Bilateral hypodense renal lesions favoring cysts, some of these are too small to characterize and we do not have precontrast images to compare. Adrenal glands unremarkable. Complex 0.9 cm lesion of the right kidney upper pole medially on image 95 series 5, possibly a complex cyst or a small mass. Urinary bladder unremarkable. There is some localized left perirenal fluid particularly adjacent to a left kidney lower pole cyst for example on image 67 series 3, significance uncertain but local inflammation cannot be excluded. Some of this edema tracks along the left paracolic gutter. Stomach/Bowel: Abnormal indistinctly marginated mid to lower rectal wall thickening, image 88 series 6, compatible with a large rectal mass. Scattered sigmoid colon diverticula. No dilated bowel noted. Vascular/Lymphatic: Prominent systemic atherosclerosis. This includes substantial plaque  for  example at the origin of the SMA and renal arteries and elsewhere. If further workup of the patient's atherosclerosis is indicated, CT angiography would be recommended. I do not observe pathologic pelvic adenopathy. Reproductive: Uterus absent.  Adnexa unremarkable. Other: No supplemental non-categorized findings. Musculoskeletal: Mild levoconvex lumbar scoliosis. Bilateral degenerative hip arthropathy. Grade 1 degenerative anterolisthesis at L4-5. IMPRESSION: 1. Large rectal mass. Two tiny hypodense lesions in the liver are nonspecific with regard to benign etiology or metastatic disease. 2. 9 mm in long axis hypodense pancreatic body lesion. Possibilities include postinflammatory cyst, intraductal papillary mucinous neoplasm, or less likely pancreatic adenocarcinoma. 3. Bilateral renal cysts. A complex right kidney upper pole lesion is indeterminate for complex cyst versus mass and measures 9 mm in diameter. 4. In light of these findings, consider upper abdominal MRI with contrast to further characterize the hepatic, pancreatic, and renal lesions. 5. There is also unusual left perirenal stranding especially adjacent to a cyst along the left kidney lower pole, this could be an indicator of inflammation involving the left kidney but is technically nonspecific. Some of this edema tracks along the left paracolic gutter. 6. Advanced systemic atherosclerosis. This includes aortic and coronary atherosclerosis along with mesenteric and renal atherosclerosis. If further workup of the patient's severe atherosclerotic vascular disease is warranted, CT angiography would be recommended. 7. Small bilateral pleural effusions with passive atelectasis. 8. Severe centrilobular emphysema.  Emphysema (ICD10-J43.9). 9. Mild thoracolumbar scoliosis. 10. Scattered sigmoid colon diverticula. Electronically Signed   By: Van Clines M.D.   On: 08/16/2021 15:53   DG CHEST PORT 1 VIEW  Result Date: 08/13/2021 CLINICAL DATA:   Hypoxia. EXAM: PORTABLE CHEST 1 VIEW COMPARISON:  Chest x-ray 08/12/2021. FINDINGS: There are atherosclerotic calcifications of the aorta. Heart size is borderline enlarged, unchanged. There is some minimal strandy bibasilar opacities, left greater than right. There is no pleural effusion or pneumothorax. No acute fractures are seen. IMPRESSION: 1. Minimal bibasilar atelectasis/airspace disease. Electronically Signed   By: Ronney Asters M.D.   On: 08/13/2021 17:18   DG CHEST PORT 1 VIEW  Result Date: 08/12/2021 CLINICAL DATA:  COVID-19 positivity with cough, initial encounter EXAM: PORTABLE CHEST 1 VIEW COMPARISON:  08/11/2021 FINDINGS: Cardiac shadow is stable. Aortic calcifications are noted. Lungs are well aerated bilaterally. No focal infiltrate or sizable effusion is seen. No bony abnormality is noted. IMPRESSION: No acute abnormality noted. Electronically Signed   By: Inez Catalina M.D.   On: 08/12/2021 22:43   DG Chest Port 1 View  Result Date: 08/11/2021 CLINICAL DATA:  COVID-19 positivity with cough, initial encounter EXAM: PORTABLE CHEST 1 VIEW COMPARISON:  10/19/2007 FINDINGS: Cardiac shadow is within normal limits. A aortic calcifications are again seen and stable. The lungs are well aerated bilaterally. No focal infiltrate or sizable effusion is seen. No bony abnormality is noted. IMPRESSION: No acute abnormality seen. Electronically Signed   By: Inez Catalina M.D.   On: 08/11/2021 00:42   MR ABDOMEN MRCP W WO CONTAST  Result Date: 08/19/2021 CLINICAL DATA:  81 year old female with history of liver and pancreatic lesions noted on recent CT examination. Rectal cancer. Follow-up study. EXAM: MRI ABDOMEN WITHOUT AND WITH CONTRAST (INCLUDING MRCP) TECHNIQUE: Multiplanar multisequence MR imaging of the abdomen was performed both before and after the administration of intravenous contrast. Heavily T2-weighted images of the biliary and pancreatic ducts were obtained, and three-dimensional MRCP images  were rendered by post processing. CONTRAST:  87m GADAVIST GADOBUTROL 1 MMOL/ML IV SOLN COMPARISON:  No prior abdominal  MRI. CT the chest, abdomen and pelvis 08/16/2021. FINDINGS: Lower chest: Small bilateral pleural effusions lying dependently with areas of increased signal intensity in the dependent portions of the lungs, poorly evaluated on today's MRI examination, but likely to represent areas of dependent subsegmental atelectasis. Hepatobiliary: Extensive susceptibility artifact centered in the region of the stomach completely obscures portions of the left lobe of the liver. With these limitations in mind there is a 7 mm T1 hypointense, T2 hyperintense, nonenhancing lesion in segment 4A of the liver, compatible with a simple hepatic cyst. No other aggressive appearing hepatic lesions. No intra or extrahepatic biliary ductal dilatation. Amorphous high signal intensity lying dependently in the gallbladder on T1 weighted images, likely reflects some biliary sludge. Gallbladder is not distended. Gallbladder wall thickness appears normal. No pericholecystic fluid. Pancreas: Head and uncinate process of the pancreas are normal in appearance. Body and tail of the pancreas are largely obscured by beam hardening artifact emanating from the patient's stomach. Spleen:  Unremarkable. Adrenals/Urinary Tract: Multiple T1 hypointense, T2 hyperintense, nonenhancing lesions in both kidneys, compatible with simple cysts, largest of which is exophytic in the interpolar region of the right kidney measuring up to 2.3 cm in diameter. No hydroureteronephrosis in the visualized portions of the abdomen. Bilateral adrenal glands are normal in appearance. Stomach/Bowel: Extensive susceptibility artifact in the stomach, presumably related to the large surgical clip noted on the recent CT examination. Other visualized portions of small bowel and colon are grossly unremarkable in appearance. Vascular/Lymphatic: Aortic atherosclerosis,  without definite aneurysm in the abdominal vasculature. No lymphadenopathy noted in the abdomen. Other: No significant volume of ascites noted in the visualized portions of the peritoneal cavity. Musculoskeletal: No aggressive appearing osseous lesions are noted in the visualized portions of the skeleton. Increased T2 signal intensity in the right psoas muscle where there are well-defined centrally low signal intensity regions with some peripheral enhancement on post gadolinium imaging, concerning for right-sided psoas abscesses or intramuscular metastasis. The largest of these (demonstrated on axial image 64 of series 28) measures 1.2 x 0.9 cm. IMPRESSION: 1. Study is severely limited by beam hardening artifact from a large surgical clip in the stomach, which completely obscures the previously noted pancreatic lesion on most pulse sequences rendering today's study nondiagnostic. The only visualized hepatic lesion is compatible with a small simple hepatic cyst. 2. There is abnormal signal intensity and small rim enhancing lesions in the right psoas muscle. Clinical correlation for signs and symptoms of psoas abscess is recommended. Alternatively, the possibility of intramuscular metastasis could be considered. 3. Small bilateral pleural effusions lying dependently with areas of presumed passive atelectasis in the lower lobes of the lungs bilaterally. 4. Multiple simple cysts in the kidneys bilaterally. 5. Aortic atherosclerosis. Electronically Signed   By: Vinnie Langton M.D.   On: 08/19/2021 08:23     US Abdomen Complete  Result Date: 08/12/2021 CLINICAL DATA:  Thrombocytopenia. EXAM: ABDOMEN ULTRASOUND COMPLETE COMPARISON:  Renal ultrasound 09/04/2017 FINDINGS: Gallbladder: No gallstones or gallbladder wall thickening. No pericholecystic fluid. The sonographer reports no sonographic Murphy's sign. Common bile duct: Diameter: 4 mm Liver: Tiny cyst noted right liver. Portal vein is patent on color Doppler  imaging with normal direction of blood flow towards the liver. IVC: No abnormality visualized. Pancreas: Partially obscured by bowel gas. Spleen: Size and appearance within normal limits. 7.4 cm craniocaudal length. Right Kidney: Length: 9.5 cm 4-5 mm nonobstructing stone evident. 2.3 cm simple cyst. No hydronephrosis. Echogenicity diffusely increased. Left Kidney: Length: 9.9 cm. Diffusely  increased echotexture. 2 cysts identified measuring up to 2.1 cm diameter. No hydronephrosis. Abdominal aorta: No aneurysm visualized. Other findings: None. IMPRESSION: 1. No evidence of splenomegaly. 2. Increased echotexture of renal parenchyma, a nonspecific finding which can be seen in medical renal disease. 3. 4-5 mm nonobstructing stone in the lower pole right kidney. Electronically Signed   By: Misty Stanley M.D.   On: 08/12/2021 07:48   CT CHEST ABDOMEN PELVIS W CONTRAST  Result Date: 08/16/2021 CLINICAL DATA:  Rectal cancer staging.  GI bleed. EXAM: CT CHEST, ABDOMEN, AND PELVIS WITH CONTRAST TECHNIQUE: Multidetector CT imaging of the chest, abdomen and pelvis was performed following the standard protocol during bolus administration of intravenous contrast. RADIATION DOSE REDUCTION: This exam was performed according to the departmental dose-optimization program which includes automated exposure control, adjustment of the mA and/or kV according to patient size and/or use of iterative reconstruction technique. CONTRAST:  160m OMNIPAQUE IOHEXOL 300 MG/ML  SOLN COMPARISON:  Abdominal ultrasound 08/12/2021, chest radiograph 08/13/2021 FINDINGS: CT CHEST FINDINGS Cardiovascular: Coronary, aortic arch, and branch vessel atherosclerotic vascular disease. Mediastinum/Nodes: 0.8 by 1.4 cm structure with internal density of 13 Hounsfield units which is at or near fluid density along the anterior pericardial margin, image 35 series 3, potentially a small pericardial cyst or an anterior pericardial lymph node. This is not felt to  be pathologically enlarged. Lungs/Pleura: Small bilateral pleural effusions with passive atelectasis. Severe centrilobular emphysema. Biapical pleuroparenchymal scarring. Musculoskeletal: Mild dextroconvex thoracic scoliosis. CT ABDOMEN PELVIS FINDINGS Hepatobiliary: Nonspecific 0.6 by 0.5 cm hypodense lesion in segment 4a of the liver on image 53 series 3. Questionable 3 by 4 mm hypodense lesion in segment 3 of the liver on image 51 series 5. Contracted gallbladder. No biliary dilatation. Pancreas: 0.9 by 0.6 by 0.7 cm hypodense lesion anteriorly in the pancreatic body on image 61 series 3. Spleen: Unremarkable Adrenals/Urinary Tract: Bilateral hypodense renal lesions favoring cysts, some of these are too small to characterize and we do not have precontrast images to compare. Adrenal glands unremarkable. Complex 0.9 cm lesion of the right kidney upper pole medially on image 95 series 5, possibly a complex cyst or a small mass. Urinary bladder unremarkable. There is some localized left perirenal fluid particularly adjacent to a left kidney lower pole cyst for example on image 67 series 3, significance uncertain but local inflammation cannot be excluded. Some of this edema tracks along the left paracolic gutter. Stomach/Bowel: Abnormal indistinctly marginated mid to lower rectal wall thickening, image 88 series 6, compatible with a large rectal mass. Scattered sigmoid colon diverticula. No dilated bowel noted. Vascular/Lymphatic: Prominent systemic atherosclerosis. This includes substantial plaque for example at the origin of the SMA and renal arteries and elsewhere. If further workup of the patient's atherosclerosis is indicated, CT angiography would be recommended. I do not observe pathologic pelvic adenopathy. Reproductive: Uterus absent.  Adnexa unremarkable. Other: No supplemental non-categorized findings. Musculoskeletal: Mild levoconvex lumbar scoliosis. Bilateral degenerative hip arthropathy. Grade 1  degenerative anterolisthesis at L4-5. IMPRESSION: 1. Large rectal mass. Two tiny hypodense lesions in the liver are nonspecific with regard to benign etiology or metastatic disease. 2. 9 mm in long axis hypodense pancreatic body lesion. Possibilities include postinflammatory cyst, intraductal papillary mucinous neoplasm, or less likely pancreatic adenocarcinoma. 3. Bilateral renal cysts. A complex right kidney upper pole lesion is indeterminate for complex cyst versus mass and measures 9 mm in diameter. 4. In light of these findings, consider upper abdominal MRI with contrast to further characterize the hepatic, pancreatic,  and renal lesions. 5. There is also unusual left perirenal stranding especially adjacent to a cyst along the left kidney lower pole, this could be an indicator of inflammation involving the left kidney but is technically nonspecific. Some of this edema tracks along the left paracolic gutter. 6. Advanced systemic atherosclerosis. This includes aortic and coronary atherosclerosis along with mesenteric and renal atherosclerosis. If further workup of the patient's severe atherosclerotic vascular disease is warranted, CT angiography would be recommended. 7. Small bilateral pleural effusions with passive atelectasis. 8. Severe centrilobular emphysema.  Emphysema (ICD10-J43.9). 9. Mild thoracolumbar scoliosis. 10. Scattered sigmoid colon diverticula. Electronically Signed   By: Van Clines M.D.   On: 08/16/2021 15:53   DG CHEST PORT 1 VIEW  Result Date: 08/13/2021 CLINICAL DATA:  Hypoxia. EXAM: PORTABLE CHEST 1 VIEW COMPARISON:  Chest x-ray 08/12/2021. FINDINGS: There are atherosclerotic calcifications of the aorta. Heart size is borderline enlarged, unchanged. There is some minimal strandy bibasilar opacities, left greater than right. There is no pleural effusion or pneumothorax. No acute fractures are seen. IMPRESSION: 1. Minimal bibasilar atelectasis/airspace disease. Electronically Signed    By: Ronney Asters M.D.   On: 08/13/2021 17:18   DG CHEST PORT 1 VIEW  Result Date: 08/12/2021 CLINICAL DATA:  COVID-19 positivity with cough, initial encounter EXAM: PORTABLE CHEST 1 VIEW COMPARISON:  08/11/2021 FINDINGS: Cardiac shadow is stable. Aortic calcifications are noted. Lungs are well aerated bilaterally. No focal infiltrate or sizable effusion is seen. No bony abnormality is noted. IMPRESSION: No acute abnormality noted. Electronically Signed   By: Inez Catalina M.D.   On: 08/12/2021 22:43   DG Chest Port 1 View  Result Date: 08/11/2021 CLINICAL DATA:  COVID-19 positivity with cough, initial encounter EXAM: PORTABLE CHEST 1 VIEW COMPARISON:  10/19/2007 FINDINGS: Cardiac shadow is within normal limits. A aortic calcifications are again seen and stable. The lungs are well aerated bilaterally. No focal infiltrate or sizable effusion is seen. No bony abnormality is noted. IMPRESSION: No acute abnormality seen. Electronically Signed   By: Inez Catalina M.D.   On: 08/11/2021 00:42   MR ABDOMEN MRCP W WO CONTAST  Result Date: 08/19/2021 CLINICAL DATA:  81 year old female with history of liver and pancreatic lesions noted on recent CT examination. Rectal cancer. Follow-up study. EXAM: MRI ABDOMEN WITHOUT AND WITH CONTRAST (INCLUDING MRCP) TECHNIQUE: Multiplanar multisequence MR imaging of the abdomen was performed both before and after the administration of intravenous contrast. Heavily T2-weighted images of the biliary and pancreatic ducts were obtained, and three-dimensional MRCP images were rendered by post processing. CONTRAST:  76m GADAVIST GADOBUTROL 1 MMOL/ML IV SOLN COMPARISON:  No prior abdominal MRI. CT the chest, abdomen and pelvis 08/16/2021. FINDINGS: Lower chest: Small bilateral pleural effusions lying dependently with areas of increased signal intensity in the dependent portions of the lungs, poorly evaluated on today's MRI examination, but likely to represent areas of dependent  subsegmental atelectasis. Hepatobiliary: Extensive susceptibility artifact centered in the region of the stomach completely obscures portions of the left lobe of the liver. With these limitations in mind there is a 7 mm T1 hypointense, T2 hyperintense, nonenhancing lesion in segment 4A of the liver, compatible with a simple hepatic cyst. No other aggressive appearing hepatic lesions. No intra or extrahepatic biliary ductal dilatation. Amorphous high signal intensity lying dependently in the gallbladder on T1 weighted images, likely reflects some biliary sludge. Gallbladder is not distended. Gallbladder wall thickness appears normal. No pericholecystic fluid. Pancreas: Head and uncinate process of the pancreas are normal in  appearance. Body and tail of the pancreas are largely obscured by beam hardening artifact emanating from the patient's stomach. Spleen:  Unremarkable. Adrenals/Urinary Tract: Multiple T1 hypointense, T2 hyperintense, nonenhancing lesions in both kidneys, compatible with simple cysts, largest of which is exophytic in the interpolar region of the right kidney measuring up to 2.3 cm in diameter. No hydroureteronephrosis in the visualized portions of the abdomen. Bilateral adrenal glands are normal in appearance. Stomach/Bowel: Extensive susceptibility artifact in the stomach, presumably related to the large surgical clip noted on the recent CT examination. Other visualized portions of small bowel and colon are grossly unremarkable in appearance. Vascular/Lymphatic: Aortic atherosclerosis, without definite aneurysm in the abdominal vasculature. No lymphadenopathy noted in the abdomen. Other: No significant volume of ascites noted in the visualized portions of the peritoneal cavity. Musculoskeletal: No aggressive appearing osseous lesions are noted in the visualized portions of the skeleton. Increased T2 signal intensity in the right psoas muscle where there are well-defined centrally low signal  intensity regions with some peripheral enhancement on post gadolinium imaging, concerning for right-sided psoas abscesses or intramuscular metastasis. The largest of these (demonstrated on axial image 64 of series 28) measures 1.2 x 0.9 cm. IMPRESSION: 1. Study is severely limited by beam hardening artifact from a large surgical clip in the stomach, which completely obscures the previously noted pancreatic lesion on most pulse sequences rendering today's study nondiagnostic. The only visualized hepatic lesion is compatible with a small simple hepatic cyst. 2. There is abnormal signal intensity and small rim enhancing lesions in the right psoas muscle. Clinical correlation for signs and symptoms of psoas abscess is recommended. Alternatively, the possibility of intramuscular metastasis could be considered. 3. Small bilateral pleural effusions lying dependently with areas of presumed passive atelectasis in the lower lobes of the lungs bilaterally. 4. Multiple simple cysts in the kidneys bilaterally. 5. Aortic atherosclerosis. Electronically Signed   By: Vinnie Langton M.D.   On: 08/19/2021 08:23    Pathology:  SURGICAL PATHOLOGY  CASE: [id number]  PATIENT: [name]  Surgical Pathology Report   Clinical History: anemia, FOBT positive stool, diarrhea in setting of  COVID, R/O H. Pylori, R/O celiac, R/O malignancy (cm)   FINAL MICROSCOPIC DIAGNOSIS:   A. STOMACH, BIOPSY:  - Gastric antral and oxyntic mucosa with nonspecific reactive  gastropathy  - Helicobacter pylori-like organisms are not identified on routine HE  stain   B. DUODENUM, BIOPSY:  - Duodenal mucosa with no specific histopathologic changes  - Negative for increased intraepithelial lymphocytes or villous  architectural changes   C. COLON, ASCENDING, DESCENDING AND SIGMOID, POLYPECTOMY:  - Tubular adenoma(s)  - Negative for high-grade dysplasia or malignancy   D. RECTAL MASS, BIOPSY:  - Fragments of an adenoma with  multifocal high-grade dysplasia, focally  suspicious for intramucosal carcinoma.  See comment   COMMENT:   D.  A deeper, more severe process cannot be ruled out.  Clinical and  endoscopic correlation is suggested.   Assessment and Plan:  1.  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  Anemia secondary to GI blood loss and CKD 3.  COVID-19 infection 4.  AKI 5.  Hypertension 6.  History of CVA 7.  Pulmonary hypertension 8.  Hyperparathyroidism  -Discussed imaging and biopsy results with the patient.  CT suggestive of a pancreatic lesion and to small hypodense lesions in the liver.  However, MRI inconclusive.  Recommend pelvic MRI to evaluate rectal mass.  Also recommend discussion of this patient's case in the  GI tumor conference to determine treatment recommendations. -Monitor hemoglobin closely and transfuse for hemoglobin less than 7.5. -Management of COVID-19 infection per hospitalist.  She is outside the window for Paxlovid. -Management of chronic medical conditions per hospitalist.  Thank you for this referral.   Mikey Bussing, DNP, AGPCNP-BC, AOCNP   Addendum  I have seen the patient, examined her. I agree with the assessment and and plan and have edited the notes.   81 year old female with multiple medical comorbidities, including hypertension, history of CVA twice, no neurological deficits, pulmonary hypertension, CKD, presented with rectal bleeding for 6 months, melena for 3 to 4 days.  Her work-up unfortunately showed a large proximal to mid rectum tumor, biopsy showed intramucosal adenocarcinoma.  CT scan was negative for distant or nodal metastasis.  I agree with Dr. Dema Severin, patient needs pelvic MRI for rectal cancer staging. If this is superficial T1 tumor, I hope she will be a candidate for transanal resection.  If she has more advanced disease, then she will need neoadjuvant chemo and radiation.  She is elderly, frail, with multiple  comorbidities, lives alone (son in [place] but not very involved), she is not a candidate for intensive chemotherapy, but I think that she would be a candidate for concurrent chemoradiation with Xeloda if her renal function does not get worse. I plan to see her back in office after discharge. We will present her case in GI tumor conference.  All questions were answered.  Truitt Merle  08/20/2021

## 2021-08-19 NOTE — Progress Notes (Signed)
PT Cancellation Note  Patient Details Name: Tina Waller MRN: [id number] DOB: 01/06/1941   Cancelled Treatment:    Reason Eval/Treat Not Completed: Other (comment) - NP donning PPE to enter room, requests at least 30 minutes with pt. PT to check back as able.  Stacie Glaze, PT DPT Acute Rehabilitation Services Pager [phone]  Office [phone]    Louis Matte 08/19/2021, 3:28 PM

## 2021-08-19 NOTE — Progress Notes (Signed)
Nutrition Follow-up  DOCUMENTATION CODES:   Severe malnutrition in context of chronic illness  INTERVENTION:  -continue Ensure Enlive po TID, each supplement provides 350 kcal and 20 grams of protein. -continue MVI with minerals daily  NUTRITION DIAGNOSIS:   Severe Malnutrition related to chronic illness as evidenced by severe fat depletion, severe muscle depletion.  Updated after completion of NFPE  GOAL:   Patient will meet greater than or equal to 90% of their needs  progressing  MONITOR:   PO intake, Supplement acceptance, Diet advancement, Labs, I & O's  REASON FOR ASSESSMENT:   Malnutrition Screening Tool    ASSESSMENT:   Pt with PMH significant for HTN, hyperparathyroidism, insomnia, anxiety, CKD stg 3b, h/o stroke admitted with GI bleed and subsequently found to be positive for COVID-19.  Pt s/p colonoscopy and EGD 08/15/21. EGD with nonbleeding angiectasia status post APC, clipped, nonbleeding gastric ulcers pending biopsy. Colonoscopy with palpable rectal mass that was biopsied but not endoscopically amendable to resection.  CEA was normal.  CT C/A/P shows large rectal mass with two tiny hypodense lesions in the liver that are nonspecific, 9 mm hypodense pancreatic body lesion. Awaiting pathology results.    Pt with ongoing poor appetite/intake, but is trying her best with meals. 0-50% meal completion x last 8 recorded meals (10% avg meal intake). Pt with orders for Ensure TID and is doing well with ONS. Recommend continue current nutrition plan of care at this time. Encourage oral intake.    UOP: 3x unmeasured occurrences x24 hours I/O: +494ml since admit  Medications: mvi w/ minerals, protonix Labs: Recent Labs  Lab 08/14/21 0840 08/16/21 0401 08/17/21 0119  NA 146* 144 144  K 3.6  --  4.0  CL 115*  --  112*  CO2 20*  --  23  BUN 10  --  7*  CREATININE 1.25*  --  1.22*  CALCIUM 8.6*  --  8.7*  GLUCOSE 89  --  94   NUTRITION - FOCUSED PHYSICAL  EXAM:  Flowsheet Row Most Recent Value  Orbital Region Mild depletion  Upper Arm Region Severe depletion  Thoracic and Lumbar Region Severe depletion  Buccal Region Moderate depletion  Temple Region Severe depletion  Clavicle Bone Region Severe depletion  Clavicle and Acromion Bone Region Severe depletion  Scapular Bone Region Severe depletion  Dorsal Hand Severe depletion  Patellar Region Severe depletion  Anterior Thigh Region Severe depletion  Posterior Calf Region Severe depletion  Edema (RD Assessment) None  Hair Reviewed  Eyes Reviewed  Mouth Reviewed  Skin Reviewed  Nails Reviewed       Diet Order:   Diet Order             Diet Heart Room service appropriate? Yes; Fluid consistency: Thin  Diet effective now                   EDUCATION NEEDS:   No education needs have been identified at this time  Skin:  Skin Assessment: Reviewed RN Assessment  Last BM:  2/5  Height:   Ht Readings from Last 1 Encounters:  08/11/21 5\' 9"  (1.753 m)    Weight:   Wt Readings from Last 1 Encounters:  08/11/21 58 kg    BMI:  Body mass index is 18.88 kg/m.  Estimated Nutritional Needs:   Kcal:  1600-1800  Protein:  80-90 grams  Fluid:  >1.6L     Theone Stanley., MS, RD, LDN (she/her/hers) RD pager number and weekend/on-call pager number  located in [place].

## 2021-08-20 ENCOUNTER — Ambulatory Visit: Payer: Medicare HMO | Admitting: Internal Medicine

## 2021-08-20 ENCOUNTER — Encounter (HOSPITAL_COMMUNITY): Payer: Self-pay | Admitting: Internal Medicine

## 2021-08-20 ENCOUNTER — Inpatient Hospital Stay (HOSPITAL_COMMUNITY): Payer: Medicare HMO

## 2021-08-20 DIAGNOSIS — C20 Malignant neoplasm of rectum: Secondary | ICD-10-CM

## 2021-08-20 DIAGNOSIS — R509 Fever, unspecified: Secondary | ICD-10-CM

## 2021-08-20 DIAGNOSIS — M109 Gout, unspecified: Secondary | ICD-10-CM

## 2021-08-20 LAB — URIC ACID: Uric Acid, Serum: 10.9 mg/dL — ABNORMAL HIGH (ref 2.5–7.1)

## 2021-08-20 LAB — CBC
HCT: 30.1 % — ABNORMAL LOW (ref 36.0–46.0)
Hemoglobin: 10.3 g/dL — ABNORMAL LOW (ref 12.0–15.0)
MCH: 32.8 pg (ref 26.0–34.0)
MCHC: 34.2 g/dL (ref 30.0–36.0)
MCV: 95.9 fL (ref 80.0–100.0)
Platelets: 352 10*3/uL (ref 150–400)
RBC: 3.14 MIL/uL — ABNORMAL LOW (ref 3.87–5.11)
RDW: 11.8 % (ref 11.5–15.5)
WBC: 9.6 10*3/uL (ref 4.0–10.5)
nRBC: 0 % (ref 0.0–0.2)

## 2021-08-20 MED ORDER — COLCHICINE 0.6 MG PO TABS
0.6000 mg | ORAL_TABLET | Freq: Every day | ORAL | Status: AC
  Administered 2021-08-20 – 2021-08-21 (×2): 0.6 mg via ORAL
  Filled 2021-08-20 (×2): qty 1

## 2021-08-20 NOTE — Plan of Care (Signed)
  Problem: Education: Goal: Knowledge of General Education information will improve Description: Including pain rating scale, medication(s)/side effects and non-pharmacologic comfort measures Outcome: Progressing   Problem: Clinical Measurements: Goal: Ability to maintain clinical measurements within normal limits will improve Outcome: Progressing   

## 2021-08-20 NOTE — Consult Note (Signed)
Tina Waller [birth year]  [id number].    Requesting MD: Dr. Nolberto Hanlon Chief Complaint/Reason for Consult: rectal mass  HPI:  This is a 81 yo female with a history of DM, pulmonary HTN, HTN, depression, anxiety, gout, history of CVA on plavix (held this admit), CKD stage III, and hyperparathyroidism who presented to the ED on 1/28 after having several days (at least 7) of melanic stools and weakness.  She denies BRBPR.  She denies any abdominal pain.  She did admit to a cough (incidentally found to have COVID here).    She denies any issues with reflux or heartburn.  She reports 6 lbs of weight loss since December, unintentional. Her last c-scope was in 2018 by Dr. Loletha Carrow which revealed diverticulosis and a small tubular adenoma.  She was supposed to see GI in February of 2022 due to fecal incontinence and diarrhea, but this was cancelled per her report because she didn't tolerate her bowel prep. Patient has a surgical history of hysterectomy. She is a former smoker who quit after CVAx2.  Upon arrival to the ED, she was noted to be hemoccult positive.  Her hgb has trended from 13.7 on admit to stabilizing out at 10.3 with no further bleeding.  She had a c-scope that reveals several benign polyps but a rectal mass c/w intramucosal carcinoma.  She has had multiple imaging studies done including an [place] which was essentially negative.  She then underwent a CT C/A/P which revealed a large rectal mass with 2 lesions in the liver which are nonspecific along with a hypodense pancreatic body lesion and complex cyst vs mass areas on her kidneys.  An MRI of the abdomen (no pelvis) was then ordered to following up on these findings noted on CT.  This was nondiagnostic due to artifact from the clip seen in the stomach.  They did note abnormal signal and small rim enhancing lesions in the R psoas muscle which could be abscess vs intramuscular mets.  Oncology has been consulted to evaluate the patient who  recommended an MRI of the pelvis and discussion and GI conference.  We have also been consulted.  ROS: ROS: Please see HPI, otherwise all other systems have been reviewed and are negative.   Family History  Problem Relation Age of Onset   Heart disease Mother        CHF   Osteoporosis Neg Hx    Colon cancer Neg Hx    Stomach cancer Neg Hx    Pancreatic cancer Neg Hx     Past Medical History:  Diagnosis Date   Anemia    Anxiety    Cataract    Depression    Diabetes mellitus without complication ([place])    pt denies   GERD (gastroesophageal reflux disease)    Gout    Headache    Hypertension    Stroke [facility]) 2008   09/14/2018   Tonsillar mass    Type 2 diabetes mellitus ([place]) 09/14/2018   Vertigo    Wears dentures    upper   Wears glasses     Past Surgical History:  Procedure Laterality Date   ABDOMINAL HYSTERECTOMY     BIOPSY  08/15/2021   Procedure: BIOPSY;  Surgeon: Irving Copas., MD;  Location: [facility] ENDOSCOPY;  Service: Gastroenterology;;   BREAST SURGERY     lumpectomy   BUNIONECTOMY     CATARACT EXTRACTION Left    COLONOSCOPY W/ BIOPSIES AND POLYPECTOMY     COLONOSCOPY  WITH PROPOFOL N/A 08/15/2021   Procedure: COLONOSCOPY WITH PROPOFOL;  Surgeon: Rush Landmark Telford Nab., MD;  Location: [place];  Service: Gastroenterology;  Laterality: N/A;   ESOPHAGOGASTRODUODENOSCOPY (EGD) WITH PROPOFOL N/A 08/15/2021   Procedure: ESOPHAGOGASTRODUODENOSCOPY (EGD) WITH PROPOFOL;  Surgeon: Rush Landmark Telford Nab., MD;  Location: [place];  Service: Gastroenterology;  Laterality: N/A;   HEMOSTASIS CLIP PLACEMENT  08/15/2021   Procedure: HEMOSTASIS CLIP PLACEMENT;  Surgeon: Irving Copas., MD;  Location: [place];  Service: Gastroenterology;;   HOT HEMOSTASIS N/A 08/15/2021   Procedure: HOT HEMOSTASIS (ARGON PLASMA COAGULATION/BICAP);  Surgeon: Irving Copas., MD;  Location: [place];  Service: Gastroenterology;  Laterality: N/A;   MULTIPLE TOOTH  EXTRACTIONS     POLYPECTOMY  08/15/2021   Procedure: POLYPECTOMY;  Surgeon: Mansouraty, Telford Nab., MD;  Location: [place];  Service: Gastroenterology;;   SUBMUCOSAL TATTOO INJECTION  08/15/2021   Procedure: SUBMUCOSAL TATTOO INJECTION;  Surgeon: Irving Copas., MD;  Location: [place];  Service: Gastroenterology;;   TONGUE BIOPSY Right 12/13/2018   Procedure: RIGHT TONSIL BIOPSY;  Surgeon: Melida Quitter, MD;  Location: [place];  Service: ENT;  Laterality: Right;    Social History:  reports that she quit smoking about 15 years ago. Her smoking use included cigarettes. She has never used smokeless tobacco. She reports current alcohol use of about 4.0 standard drinks per week. She reports that she does not use drugs.  Allergies:  Allergies  Allergen Reactions   Lisinopril Cough   Carvedilol Diarrhea   Hydralazine     Vomiting and diarrhea   Lipitor [Atorvastatin] Diarrhea    Medications Prior to Admission  Medication Sig Dispense Refill   acetaminophen (TYLENOL) 500 MG tablet Take 500-1,000 mg by mouth every 6 (six) hours as needed for moderate pain or headache.     amLODipine (NORVASC) 10 MG tablet TAKE 1 TABLET EVERY DAY (Patient taking differently: Take 10 mg by mouth daily.) 90 tablet 0   cholecalciferol (VITAMIN D3) 25 MCG (1000 UNIT) tablet Take 2 tablets (2,000 Units total) by mouth daily. 100 tablet 1   furosemide (LASIX) 40 MG tablet TAKE 1 TABLET (40 MG TOTAL) BY MOUTH 2 (TWO) TIMES DAILY. 180 tablet 0   isosorbide mononitrate (IMDUR) 60 MG 24 hr tablet Take 1.5 tablets (90 mg total) by mouth daily. 135 tablet 3   losartan (COZAAR) 100 MG tablet TAKE 1 TABLET (100 MG TOTAL) BY MOUTH DAILY. 90 tablet 0   metoprolol succinate (TOPROL-XL) 50 MG 24 hr tablet TAKE 2 TABLETS EVERY DAY (Patient taking differently: Take 50 mg by mouth 2 (two) times daily.) 180 tablet 1   mometasone (ELOCON) 0.1 % ointment Apply topically daily. 45 g 0   polyethylene glycol (MIRALAX) packet  Take 17 g by mouth daily as needed for mild constipation.     pravastatin (PRAVACHOL) 80 MG tablet TAKE 1 TABLET EVERY DAY FOR HIGH CHOLESTEROL (Patient taking differently: Take 80 mg by mouth daily.) 90 tablet 0   traZODone (DESYREL) 100 MG tablet Take 1 tablet (100 mg total) by mouth at bedtime as needed for sleep. 30 tablet 1   triamcinolone cream (KENALOG) 0.1 % Apply 1 application topically 2 (two) times daily as needed (rash).     vitamin B-12 (CYANOCOBALAMIN) 1000 MCG tablet Take 1,000 mcg by mouth daily.     clopidogrel (PLAVIX) 75 MG tablet TAKE 1 TABLET (75 MG TOTAL) BY MOUTH DAILY. (Patient not taking: Reported on 08/11/2021) 90 tablet 0     Physical Exam: Blood  pressure 139/61, pulse 92, temperature 99.3 F (37.4 C), temperature source Oral, resp. rate 16, height 5\' 9"  (1.753 m), weight 58 kg, SpO2 95 %. General: pleasant, WD female who is laying in bed in NAD HEENT: head is normocephalic, atraumatic.  Sclera are noninjected.  PERRL.  Ears and nose without any masses or lesions.  Mouth is pink and moist Heart: regular, rate, and rhythm.  Normal s1,s2. No obvious murmurs, gallops, or rubs noted.  Palpable radial and pedal pulses bilaterally Lungs: CTAB, some rhonchi,  Respiratory effort nonlabored on [place] Abd: soft, NT, ND, +BS, no masses, hernias, or organomegaly Rectal: no visible masses, hemorrhoids, or gross blood on external exam.  MS: all 4 extremities are symmetrical with no cyanosis, clubbing, or edema. Skin: warm and dry with no masses, lesions, or rashes Neuro: Cranial nerves 2-12 grossly intact, sensation is normal throughout Psych: A&Ox3 with an appropriate affect.   Results for orders placed or performed during the hospital encounter of 08/10/21 (from the past 48 hour(s))  Glucose, capillary     Status: Abnormal   Collection Time: 08/19/21  9:54 PM  Result Value Ref Range   Glucose-Capillary 103 (H) 70 - 99 mg/dL    Comment: Glucose reference range applies only to  samples taken after fasting for at least 8 hours.  CBC     Status: Abnormal   Collection Time: 08/20/21  9:28 AM  Result Value Ref Range   WBC 9.6 4.0 - 10.5 K/uL   RBC 3.14 (L) 3.87 - 5.11 MIL/uL   Hemoglobin 10.3 (L) 12.0 - 15.0 g/dL   HCT 30.1 (L) 36.0 - 46.0 %   MCV 95.9 80.0 - 100.0 fL   MCH 32.8 26.0 - 34.0 pg   MCHC 34.2 30.0 - 36.0 g/dL   RDW 11.8 11.5 - 15.5 %   Platelets 352 150 - 400 K/uL   nRBC 0.0 0.0 - 0.2 %    Comment: Performed at [facility], [place] [address] [place], [place] [address]   DG CHEST PORT 1 VIEW  Result Date: 08/20/2021 CLINICAL DATA:  Fever EXAM: PORTABLE CHEST 1 VIEW COMPARISON:  08/13/2021 FINDINGS: Bilateral emphysematous changes. Bilateral chronic interstitial thickening. Left lower lobe airspace disease which may reflect atelectasis versus pneumonia. Small left pleural effusion. No pneumothorax. Stable cardiomediastinal silhouette. No aggressive osseous lesion. IMPRESSION: 1. Left lower lobe airspace disease which may reflect atelectasis versus pneumonia. Small left pleural effusion. 2. COPD. Electronically Signed   By: Kathreen Devoid M.D.   On: 08/20/2021 09:28   MR ABDOMEN MRCP W WO CONTAST  Result Date: 08/19/2021 CLINICAL DATA:  81 year old female with history of liver and pancreatic lesions noted on recent CT examination. Rectal cancer. Follow-up study. EXAM: MRI ABDOMEN WITHOUT AND WITH CONTRAST (INCLUDING MRCP) TECHNIQUE: Multiplanar multisequence MR imaging of the abdomen was performed both before and after the administration of intravenous contrast. Heavily T2-weighted images of the biliary and pancreatic ducts were obtained, and three-dimensional MRCP images were rendered by post processing. CONTRAST:  85mL GADAVIST GADOBUTROL 1 MMOL/ML IV SOLN COMPARISON:  No prior abdominal MRI. CT the chest, abdomen and pelvis 08/16/2021. FINDINGS: Lower chest: Small bilateral pleural effusions lying dependently with areas of increased signal intensity in the  dependent portions of the lungs, poorly evaluated on today's MRI examination, but likely to represent areas of dependent subsegmental atelectasis. Hepatobiliary: Extensive susceptibility artifact centered in the region of the stomach completely obscures portions of the left lobe of the liver. With these limitations in  mind there is a 7 mm T1 hypointense, T2 hyperintense, nonenhancing lesion in segment 4A of the liver, compatible with a simple hepatic cyst. No other aggressive appearing hepatic lesions. No intra or extrahepatic biliary ductal dilatation. Amorphous high signal intensity lying dependently in the gallbladder on T1 weighted images, likely reflects some biliary sludge. Gallbladder is not distended. Gallbladder wall thickness appears normal. No pericholecystic fluid. Pancreas: Head and uncinate process of the pancreas are normal in appearance. Body and tail of the pancreas are largely obscured by beam hardening artifact emanating from the patient's stomach. Spleen:  Unremarkable. Adrenals/Urinary Tract: Multiple T1 hypointense, T2 hyperintense, nonenhancing lesions in both kidneys, compatible with simple cysts, largest of which is exophytic in the interpolar region of the right kidney measuring up to 2.3 cm in diameter. No hydroureteronephrosis in the visualized portions of the abdomen. Bilateral adrenal glands are normal in appearance. Stomach/Bowel: Extensive susceptibility artifact in the stomach, presumably related to the large surgical clip noted on the recent CT examination. Other visualized portions of small bowel and colon are grossly unremarkable in appearance. Vascular/Lymphatic: Aortic atherosclerosis, without definite aneurysm in the abdominal vasculature. No lymphadenopathy noted in the abdomen. Other: No significant volume of ascites noted in the visualized portions of the peritoneal cavity. Musculoskeletal: No aggressive appearing osseous lesions are noted in the visualized portions of the  skeleton. Increased T2 signal intensity in the right psoas muscle where there are well-defined centrally low signal intensity regions with some peripheral enhancement on post gadolinium imaging, concerning for right-sided psoas abscesses or intramuscular metastasis. The largest of these (demonstrated on axial image 64 of series 28) measures 1.2 x 0.9 cm. IMPRESSION: 1. Study is severely limited by beam hardening artifact from a large surgical clip in the stomach, which completely obscures the previously noted pancreatic lesion on most pulse sequences rendering today's study nondiagnostic. The only visualized hepatic lesion is compatible with a small simple hepatic cyst. 2. There is abnormal signal intensity and small rim enhancing lesions in the right psoas muscle. Clinical correlation for signs and symptoms of psoas abscess is recommended. Alternatively, the possibility of intramuscular metastasis could be considered. 3. Small bilateral pleural effusions lying dependently with areas of presumed passive atelectasis in the lower lobes of the lungs bilaterally. 4. Multiple simple cysts in the kidneys bilaterally. 5. Aortic atherosclerosis. Electronically Signed   By: Vinnie Langton M.D.   On: 08/19/2021 08:23      Assessment/Plan Melena with rectal mass c/w intramucosal carcinoma (7cm from anal verge) The patient's imaging, chart, labs, consult notes, progress notes, and H&P, as well as procedure notes.  The patient appears to have a rectal cancer that was oozing secondary to friability and use of plavix.  Her hgb has stabilized while off of the plavix.  The patient needs an MR of her pelvis for staging purposes of this mass.  Agree with oncologic recommendation of this.  Also agree with discussion at GI tumor board.  Generally rectal tumors are treated with chemo/radiation as first line therapy and surgery to follow as needed, even in the setting of bleeding as radiation usually helps this issue.  Will  discussed with Dr. Dema Severin to see if he has further acute recommendations.  FEN - HH VTE - SCD's ID - none  High Medical Decision Making  Obie Dredge, [facility] 08/20/2021, 10:52 AM Please see Amion for pager number during day hours 7:00am-4:30pm or 7:00am -11:30am on weekends

## 2021-08-20 NOTE — Progress Notes (Addendum)
°PROGRESS NOTE ° ° ° °Tina Waller  MRN:[id number] DOB: 07/26/1940 DOA: 08/10/2021 °PCP: Johnson, Deborah B, MD  ° ° °Brief Narrative:  °Tina Waller is a 80 y.o. F with pHTN, HTN, CKD IIIb, hx stroke on Plavix, and hyperparathyoidism who presented with melena, cough. ° °In the ER, found to have COVID, also hemoglobin trending down.  Started on PPI and admitted. °She underwent EGD/Cscope and found with rectal mass.  Path revealed intramucosal carcinoma.  Oncology and colorectal surgery has been consulted. ° ° °  °2/2 status post EGD and C-scope  °2/7 c/o foot pain, very sensitive to touch.  Reports has had gout in the past but its been a long time.  Tmax 100.1 ° ° °Consultants:  °GI, oncology, colorectal surgery ° °Procedures:  ° °Antimicrobials:  °  ° ° °Subjective: °Denies any shortness of breath, chest pain or any other complaints other than above ° °Objective: °Vitals:  ° 08/19/21 0530 08/19/21 0909 08/19/21 1722 08/19/21 2155  °BP: (!) 143/63 130/67 (!) 151/68 (!) 141/61  °Pulse: 86 91 91 99  °Resp: 18 18 16 18  °Temp: 98.7 °F (37.1 °C) 99.1 °F (37.3 °C) 99.1 °F (37.3 °C) 100.1 °F (37.8 °C)  °TempSrc: Oral Oral Oral Oral  °SpO2: 95% 94% 91% 93%  °Weight:      °Height:      ° ° °Intake/Output Summary (Last 24 hours) at 08/20/2021 0839 °Last data filed at 08/19/2021 2206 °Gross per 24 hour  °Intake 757 ml  °Output 0 ml  °Net 757 ml  ° °Filed Weights  ° 08/11/21 0359  °Weight: 58 kg  ° ° °Examination: °Calm, NAD °Cta no w/r °Reg s1/s2 no gallop °Soft benign +bs °No edema, very sensitive to touch/ttp. RT>Lt of foot and toes. °Aaoxox3  °Mood and affect appropriate in current setting  ° °Data Reviewed: I have personally reviewed following labs and imaging studies ° °CBC: °Recent Labs  °Lab 08/14/21 °0840  °WBC 4.6  °HGB 10.7*  °HCT 32.1*  °MCV 99.1  °PLT 188  ° °Basic Metabolic Panel: °Recent Labs  °Lab 08/14/21 °0840 08/16/21 °0401 08/17/21 °0119  °NA 146* 144 144  °K 3.6  --  4.0  °CL 115*  --  112*  °CO2 20*  --   23  °GLUCOSE 89  --  94  °BUN 10  --  7*  °CREATININE 1.25*  --  1.22*  °CALCIUM 8.6*  --  8.7*  ° °GFR: °Estimated Creatinine Clearance: 34.8 mL/min (A) (by C-G formula based on SCr of 1.22 mg/dL (H)). °Liver Function Tests: °Recent Labs  °Lab 08/14/21 °0840  °AST 23  °ALT 11  °ALKPHOS 51  °BILITOT 0.7  °PROT 5.7*  °ALBUMIN 2.6*  ° °No results for input(s): LIPASE, AMYLASE in the last 168 hours. °No results for input(s): AMMONIA in the last 168 hours. °Coagulation Profile: °No results for input(s): INR, PROTIME in the last 168 hours. °Cardiac Enzymes: °No results for input(s): CKTOTAL, CKMB, CKMBINDEX, TROPONINI in the last 168 hours. °BNP (last 3 results) °No results for input(s): PROBNP in the last 8760 hours. °HbA1C: °No results for input(s): HGBA1C in the last 72 hours. °CBG: °Recent Labs  °Lab 08/19/21 °2154  °GLUCAP 103*  ° °Lipid Profile: °No results for input(s): CHOL, HDL, LDLCALC, TRIG, CHOLHDL, LDLDIRECT in the last 72 hours. °Thyroid Function Tests: °No results for input(s): TSH, T4TOTAL, FREET4, T3FREE, THYROIDAB in the last 72 hours. °Anemia Panel: °No results for input(s): VITAMINB12, FOLATE, FERRITIN, TIBC, IRON, RETICCTPCT in   the last 72 hours. °Sepsis Labs: °No results for input(s): PROCALCITON, LATICACIDVEN in the last 168 hours. ° °Recent Results (from the past 240 hour(s))  °Resp Panel by RT-PCR (Flu A&B, Covid) Nasopharyngeal Swab     Status: Abnormal  ° Collection Time: 08/10/21 10:38 PM  ° Specimen: Nasopharyngeal Swab; Nasopharyngeal(NP) swabs in vial transport medium  °Result Value Ref Range Status  ° SARS Coronavirus 2 by RT PCR POSITIVE (A) NEGATIVE Final  °  Comment: (NOTE) °SARS-CoV-2 target nucleic acids are DETECTED. ° °The SARS-CoV-2 RNA is generally detectable in upper respiratory °specimens during the acute phase of infection. Positive results are °indicative of the presence of the identified virus, but do not rule °out bacterial infection or co-infection with other pathogens  not °detected by the test. Clinical correlation with patient history and °other diagnostic information is necessary to determine patient °infection status. The expected result is Negative. ° °Fact Sheet for Patients: °https://www.fda.gov/media/152166/download ° °Fact Sheet for Healthcare Providers: °https://www.fda.gov/media/152162/download ° °This test is not yet approved or cleared by the [place] FDA and  °has been authorized for detection and/or diagnosis of SARS-CoV-2 by °FDA under an Emergency Use Authorization (EUA).  This EUA will °remain in effect (meaning this test can be used) for the duration of  °the COVID-19 declaration under Section 564(b)(1) of the A ct, 21 °U.S.C. section 360bbb-3(b)(1), unless the authorization is °terminated or revoked sooner. ° °  ° Influenza A by PCR NEGATIVE NEGATIVE Final  ° Influenza B by PCR NEGATIVE NEGATIVE Final  °  Comment: (NOTE) °The Xpert Xpress SARS-CoV-2/FLU/RSV plus assay is intended as an aid °in the diagnosis of influenza from Nasopharyngeal swab specimens and °should not be used as a sole basis for treatment. Nasal washings and °aspirates are unacceptable for Xpert Xpress SARS-CoV-2/FLU/RSV °testing. ° °Fact Sheet for Patients: °https://www.fda.gov/media/152166/download ° °Fact Sheet for Healthcare Providers: °https://www.fda.gov/media/152162/download ° °This test is not yet approved or cleared by the [place] FDA and °has been authorized for detection and/or diagnosis of SARS-CoV-2 by °FDA under an Emergency Use Authorization (EUA). This EUA will remain °in effect (meaning this test can be used) for the duration of the °COVID-19 declaration under Section 564(b)(1) of the Act, 21 U.S.C. °section 360bbb-3(b)(1), unless the authorization is terminated or °revoked. ° °Performed at [facility], [address] [place], [place] °[address] °  °  ° ° ° ° ° °Radiology Studies: °MR ABDOMEN MRCP W WO CONTAST ° °Result Date: 08/19/2021 °CLINICAL DATA:   80-year-old female with history of liver and pancreatic lesions noted on recent CT examination. Rectal cancer. Follow-up study. EXAM: MRI ABDOMEN WITHOUT AND WITH CONTRAST (INCLUDING MRCP) TECHNIQUE: Multiplanar multisequence MR imaging of the abdomen was performed both before and after the administration of intravenous contrast. Heavily T2-weighted images of the biliary and pancreatic ducts were obtained, and three-dimensional MRCP images were rendered by post processing. CONTRAST:  6mL GADAVIST GADOBUTROL 1 MMOL/ML IV SOLN COMPARISON:  No prior abdominal MRI. CT the chest, abdomen and pelvis 08/16/2021. FINDINGS: Lower chest: Small bilateral pleural effusions lying dependently with areas of increased signal intensity in the dependent portions of the lungs, poorly evaluated on today's MRI examination, but likely to represent areas of dependent subsegmental atelectasis. Hepatobiliary: Extensive susceptibility artifact centered in the region of the stomach completely obscures portions of the left lobe of the liver. With these limitations in mind there is a 7 mm T1 hypointense, T2 hyperintense, nonenhancing lesion in segment 4A of the liver, compatible with a simple   simple hepatic cyst. No other aggressive appearing hepatic lesions. No intra or extrahepatic biliary ductal dilatation. Amorphous high signal intensity lying dependently in the gallbladder on T1 weighted images, likely reflects some biliary sludge. Gallbladder is not distended. Gallbladder wall thickness appears normal. No pericholecystic fluid. Pancreas: Head and uncinate process of the pancreas are normal in appearance. Body and tail of the pancreas are largely obscured by beam hardening artifact emanating from the patient's stomach. Spleen:  Unremarkable. Adrenals/Urinary Tract: Multiple T1 hypointense, T2 hyperintense, nonenhancing lesions in both kidneys, compatible with simple cysts, largest of which is exophytic in the interpolar region of the right kidney  measuring up to 2.3 cm in diameter. No hydroureteronephrosis in the visualized portions of the abdomen. Bilateral adrenal glands are normal in appearance. Stomach/Bowel: Extensive susceptibility artifact in the stomach, presumably related to the large surgical clip noted on the recent CT examination. Other visualized portions of small bowel and colon are grossly unremarkable in appearance. Vascular/Lymphatic: Aortic atherosclerosis, without definite aneurysm in the abdominal vasculature. No lymphadenopathy noted in the abdomen. Other: No significant volume of ascites noted in the visualized portions of the peritoneal cavity. Musculoskeletal: No aggressive appearing osseous lesions are noted in the visualized portions of the skeleton. Increased T2 signal intensity in the right psoas muscle where there are well-defined centrally low signal intensity regions with some peripheral enhancement on post gadolinium imaging, concerning for right-sided psoas abscesses or intramuscular metastasis. The largest of these (demonstrated on axial image 64 of series 28) measures 1.2 x 0.9 cm. IMPRESSION: 1. Study is severely limited by beam hardening artifact from a large surgical clip in the stomach, which completely obscures the previously noted pancreatic lesion on most pulse sequences rendering today's study nondiagnostic. The only visualized hepatic lesion is compatible with a small simple hepatic cyst. 2. There is abnormal signal intensity and small rim enhancing lesions in the right psoas muscle. Clinical correlation for signs and symptoms of psoas abscess is recommended. Alternatively, the possibility of intramuscular metastasis could be considered. 3. Small bilateral pleural effusions lying dependently with areas of presumed passive atelectasis in the lower lobes of the lungs bilaterally. 4. Multiple simple cysts in the kidneys bilaterally. 5. Aortic atherosclerosis. Electronically Signed   By: Vinnie Langton M.D.   On:  08/19/2021 08:23        Scheduled Meds:  amLODipine  10 mg Oral Daily   benzonatate  100 mg Oral BID   feeding supplement  237 mL Oral TID BM   isosorbide mononitrate  90 mg Oral Daily   metoprolol succinate  100 mg Oral Daily   multivitamin with minerals  1 tablet Oral Daily   pantoprazole  40 mg Oral BID   peg 3350 powder  0.5 kit Oral Once   pravastatin  80 mg Oral Daily   sodium chloride flush  3 mL Intravenous Q12H   Continuous Infusions:    Assessment & Plan:   Principal Problem:   GI bleed Active Problems:   History of CVA (cerebrovascular accident)   HTN (hypertension)   Insomnia   COVID-19 virus infection   Acute renal failure superimposed on stage 3 chronic kidney disease (HCC)   Thrombocytopenia (HCC)   GI bleeding   Protein-calorie malnutrition, severe   Acute gastrointestinal hemorrhage, suspected upper GI bleed Acute blood loss anemia Hgb down to 10 g/dL, now stable, no further melena.   Iron stores normal. status post EGD and colonoscopy on 2/2  Found with 4 rectosigmoid colon polyps.  Also found  with rectal mass which was biopsied.  Depending on path report we will decide whether it needs oncology involvement versus surgery.  CEA 3.7 CT A/P with large rectal mass ,tiny lesions in the liver benign versus metastatic.  Please see full report of CT scan.   MRI severely limited due to artifact. See full report...inconclusive Continue to hold Plavix for few days  Oncology consulted input was appreciated-rec. MRI pelvic to evaluate rectal mass GI recommended discussion of patient's case in the GI tumor conference to determine treatment recommendations Transfuse hemoglobin less than 7.5 Colorectal surgery consulted today       Acute kidney injury on Chronic kidney disease stage IIIb Baseline Cr 1.4-1.7.  2/7 prerenal  Improved with IV fluids  Monitor periodically, currently stay        COVID infection Outside window for Paxlovid (symptoms  started >7 days PTA), 2/2 asymptomatic currently 2/7 continue weaning off of O2 as O2 sats above 92%  Last day of contact precaution is today     Protein calorie malnutrition Due to chronic illness as evidenced by severe fat depletion and severe muscle depletion 2/7 nutrition consulted please see note       Hypertension Cerebrovascular disease Pulmonary hypertension Stable Continue metoprolol and amlodipine     Possible Acute gout  With painful foot, toes, very sensitive to touch Patient reports history of gout but he has been a longtime Discussed colchicine and possible diarrhea, she is agreeable to couple of doses to see how she feels Ck uric acid     Fever Low grade Possibly from gout attack However will ck bcx, cxr    Hyperparathyroidism Follow-up with PCP as outpatient           DVT prophylaxis: SCD Code Status: Full Family Communication: None at bedside Disposition Plan: Home Status is: Inpatient Remains inpatient appropriate because: IV treatment.  Work-up pending Planned Discharge Destination: Home with Home Health              LOS: 8 days   Time spent: 45 minutes with more than 50% on [place], MD [facility] Pager [phone]  If 7PM-7AM, please contact night-coverage 08/20/2021, 8:39 AM

## 2021-08-20 NOTE — Progress Notes (Addendum)
Oncology Discharge Planning Admission Note  [facility] Address: [address], [place], [place] [address] Hours of Operation:  8am - 5pm, Monday - Friday  Clinic Contact Information:  [phone]  Oncology Care Team: Medical Oncologist:  Dr. Octavio Graves, NP is aware of this hospital admission dated 08/12/21 and has assessed patient at bedside. The cancer center will follow Tina Waller inpatient care to assist with discharge planning as indicated by the oncologist.  We will arrange for follow up after discharge.  Disclaimer:  This [place] note does not imply a formal consult request has been made by the admitting attending for this admission or there will be an inpatient consult completed by oncology.  Please request oncology consults as per standard process as indicated.

## 2021-08-20 NOTE — Plan of Care (Signed)
?  Problem: Clinical Measurements: ?Goal: Will remain free from infection ?Outcome: Progressing ?  ?Problem: Clinical Measurements: ?Goal: Diagnostic test results will improve ?Outcome: Progressing ?  ?

## 2021-08-20 NOTE — Progress Notes (Signed)
Physical Therapy Treatment Patient Details Name: Tina Waller MRN: [id number] DOB: 02-16-1941 Today's Date: 08/20/2021   History of Present Illness 81 year old presented to the ER with black stools without any nausea vomiting or abdominal pain.  In the emergency room she was found to have acute kidney injury, initial hemoglobin was stable but subsequently dropped by 2 points, covid +, initial symptoms around 1/23; with history of pulmonary hypertension, hyperparathyroidism, insomnia, anxiety, hypertension, CKD stage IIIb, history of stroke on Plavix. Patient found to have rectal cancer. Also reporting has has gout in B feet.    PT Comments    Patient received in bed. Reports she is not well today. Reports pain in B feet, says it is gout. Very painful to touch or with movement. She declines sitting edge of bed or getting out of bed this date. She agrees to bed exercises only. Patient requiring assist with B LE exercises due to pain and weakness. She will continue to benefit from skilled PT while here to improve strength and functional independence.        Recommendations for follow up therapy are one component of a multi-disciplinary discharge planning process, led by the attending physician.  Recommendations may be updated based on patient status, additional functional criteria and insurance authorization.  Follow Up Recommendations  Skilled nursing-short term rehab (<3 hours/day)     Assistance Recommended at Discharge Intermittent Supervision/Assistance  Patient can return home with the following A little help with walking and/or transfers;A little help with bathing/dressing/bathroom;Assistance with cooking/housework   Equipment Recommendations  BSC/3in1;Rolling walker (2 wheels)    Recommendations for Other Services       Precautions / Restrictions Precautions Precautions: Fall Precaution Comments: Air/Con Covid; now on supplemental O2 Restrictions Weight Bearing Restrictions:  No Other Position/Activity Restrictions: patient with very painful feet bilaterally. Says she was told she has gout.     Mobility  Bed Mobility               General bed mobility comments: declines oob activity this date. Declines sitting edge of bed.    Transfers                        Ambulation/Gait                   Stairs             Wheelchair Mobility    Modified Rankin (Stroke Patients Only)       Balance                                            Cognition Arousal/Alertness: Awake/alert Behavior During Therapy: WFL for tasks assessed/performed Overall Cognitive Status: Within Functional Limits for tasks assessed                                          Exercises Other Exercises Other Exercises: B LE exercises; Heel slides, Hip abd/add, SLR x 5-10 reps as tolerated with assist as needed due to pain/weakness.    General Comments        Pertinent Vitals/Pain Pain Assessment Pain Assessment: Faces Faces Pain Scale: Hurts whole lot Pain Location: B feet Pain Descriptors / Indicators: Discomfort, Guarding, Grimacing, Sore, Tender Pain Intervention(s): Monitored  during session, Limited activity within patient's tolerance    Home Living                          Prior Function            PT Goals (current goals can now be found in the care plan section) Acute Rehab PT Goals Patient Stated Goal: get stronger PT Goal Formulation: With patient Time For Goal Achievement: 08/26/21 Potential to Achieve Goals: Fair Progress towards PT goals: Progressing toward goals    Frequency    Min 2X/week      PT Plan Current plan remains appropriate    Co-evaluation              AM-PAC PT "6 Clicks" Mobility   Outcome Measure  Help needed turning from your back to your side while in a flat bed without using bedrails?: None Help needed moving from lying on your back to  sitting on the side of a flat bed without using bedrails?: A Lot Help needed moving to and from a bed to a chair (including a wheelchair)?: A Lot Help needed standing up from a chair using your arms (e.g., wheelchair or bedside chair)?: A Lot Help needed to walk in hospital room?: Total Help needed climbing 3-5 steps with a railing? : Total 6 Click Score: 12    End of Session Equipment Utilized During Treatment: Oxygen Activity Tolerance: Patient limited by pain Patient left: in bed;with call bell/phone within reach Nurse Communication: Mobility status PT Visit Diagnosis: Unsteadiness on feet (R26.81);Muscle weakness (generalized) (M62.81)     Time: 8502-7741 PT Time Calculation (min) (ACUTE ONLY): 10 min  Charges:  $Therapeutic Exercise: 8-22 mins                     Deral Schellenberg, PT, GCS 08/20/21,10:44 AM

## 2021-08-21 ENCOUNTER — Inpatient Hospital Stay (HOSPITAL_COMMUNITY): Payer: Medicare HMO

## 2021-08-21 ENCOUNTER — Encounter: Payer: Self-pay | Admitting: Gastroenterology

## 2021-08-21 DIAGNOSIS — G4700 Insomnia, unspecified: Secondary | ICD-10-CM | POA: Diagnosis not present

## 2021-08-21 DIAGNOSIS — Z9981 Dependence on supplemental oxygen: Secondary | ICD-10-CM | POA: Diagnosis not present

## 2021-08-21 DIAGNOSIS — E213 Hyperparathyroidism, unspecified: Secondary | ICD-10-CM | POA: Diagnosis not present

## 2021-08-21 DIAGNOSIS — K4020 Bilateral inguinal hernia, without obstruction or gangrene, not specified as recurrent: Secondary | ICD-10-CM | POA: Diagnosis not present

## 2021-08-21 DIAGNOSIS — Z8679 Personal history of other diseases of the circulatory system: Secondary | ICD-10-CM | POA: Diagnosis not present

## 2021-08-21 DIAGNOSIS — K922 Gastrointestinal hemorrhage, unspecified: Secondary | ICD-10-CM | POA: Diagnosis not present

## 2021-08-21 DIAGNOSIS — D631 Anemia in chronic kidney disease: Secondary | ICD-10-CM | POA: Diagnosis not present

## 2021-08-21 DIAGNOSIS — D508 Other iron deficiency anemias: Secondary | ICD-10-CM | POA: Diagnosis not present

## 2021-08-21 DIAGNOSIS — R2689 Other abnormalities of gait and mobility: Secondary | ICD-10-CM | POA: Diagnosis not present

## 2021-08-21 DIAGNOSIS — J80 Acute respiratory distress syndrome: Secondary | ICD-10-CM | POA: Diagnosis not present

## 2021-08-21 DIAGNOSIS — I119 Hypertensive heart disease without heart failure: Secondary | ICD-10-CM | POA: Diagnosis not present

## 2021-08-21 DIAGNOSIS — Z8616 Personal history of COVID-19: Secondary | ICD-10-CM | POA: Diagnosis not present

## 2021-08-21 DIAGNOSIS — N1832 Chronic kidney disease, stage 3b: Secondary | ICD-10-CM

## 2021-08-21 DIAGNOSIS — I10 Essential (primary) hypertension: Secondary | ICD-10-CM | POA: Diagnosis not present

## 2021-08-21 DIAGNOSIS — G4730 Sleep apnea, unspecified: Secondary | ICD-10-CM | POA: Diagnosis not present

## 2021-08-21 DIAGNOSIS — Z79899 Other long term (current) drug therapy: Secondary | ICD-10-CM | POA: Diagnosis not present

## 2021-08-21 DIAGNOSIS — D649 Anemia, unspecified: Secondary | ICD-10-CM | POA: Diagnosis not present

## 2021-08-21 DIAGNOSIS — M6281 Muscle weakness (generalized): Secondary | ICD-10-CM | POA: Diagnosis not present

## 2021-08-21 DIAGNOSIS — M6258 Muscle wasting and atrophy, not elsewhere classified, other site: Secondary | ICD-10-CM | POA: Diagnosis not present

## 2021-08-21 DIAGNOSIS — N8189 Other female genital prolapse: Secondary | ICD-10-CM | POA: Diagnosis not present

## 2021-08-21 DIAGNOSIS — N179 Acute kidney failure, unspecified: Secondary | ICD-10-CM | POA: Diagnosis not present

## 2021-08-21 DIAGNOSIS — I129 Hypertensive chronic kidney disease with stage 1 through stage 4 chronic kidney disease, or unspecified chronic kidney disease: Secondary | ICD-10-CM | POA: Diagnosis not present

## 2021-08-21 DIAGNOSIS — E43 Unspecified severe protein-calorie malnutrition: Secondary | ICD-10-CM | POA: Diagnosis not present

## 2021-08-21 DIAGNOSIS — U071 COVID-19: Secondary | ICD-10-CM | POA: Diagnosis not present

## 2021-08-21 DIAGNOSIS — R498 Other voice and resonance disorders: Secondary | ICD-10-CM | POA: Diagnosis not present

## 2021-08-21 DIAGNOSIS — R262 Difficulty in walking, not elsewhere classified: Secondary | ICD-10-CM | POA: Diagnosis not present

## 2021-08-21 DIAGNOSIS — G479 Sleep disorder, unspecified: Secondary | ICD-10-CM | POA: Diagnosis not present

## 2021-08-21 DIAGNOSIS — R531 Weakness: Secondary | ICD-10-CM | POA: Diagnosis not present

## 2021-08-21 DIAGNOSIS — D696 Thrombocytopenia, unspecified: Secondary | ICD-10-CM | POA: Diagnosis not present

## 2021-08-21 DIAGNOSIS — J9601 Acute respiratory failure with hypoxia: Secondary | ICD-10-CM | POA: Diagnosis not present

## 2021-08-21 DIAGNOSIS — Z8673 Personal history of transient ischemic attack (TIA), and cerebral infarction without residual deficits: Secondary | ICD-10-CM | POA: Diagnosis not present

## 2021-08-21 DIAGNOSIS — M109 Gout, unspecified: Secondary | ICD-10-CM | POA: Diagnosis not present

## 2021-08-21 DIAGNOSIS — R41841 Cognitive communication deficit: Secondary | ICD-10-CM | POA: Diagnosis not present

## 2021-08-21 DIAGNOSIS — R5383 Other fatigue: Secondary | ICD-10-CM | POA: Diagnosis not present

## 2021-08-21 DIAGNOSIS — R58 Hemorrhage, not elsewhere classified: Secondary | ICD-10-CM | POA: Diagnosis not present

## 2021-08-21 DIAGNOSIS — B9689 Other specified bacterial agents as the cause of diseases classified elsewhere: Secondary | ICD-10-CM | POA: Diagnosis not present

## 2021-08-21 DIAGNOSIS — Z1331 Encounter for screening for depression: Secondary | ICD-10-CM | POA: Diagnosis not present

## 2021-08-21 DIAGNOSIS — F411 Generalized anxiety disorder: Secondary | ICD-10-CM | POA: Diagnosis not present

## 2021-08-21 DIAGNOSIS — I272 Pulmonary hypertension, unspecified: Secondary | ICD-10-CM | POA: Diagnosis not present

## 2021-08-21 DIAGNOSIS — D500 Iron deficiency anemia secondary to blood loss (chronic): Secondary | ICD-10-CM | POA: Diagnosis not present

## 2021-08-21 DIAGNOSIS — R1312 Dysphagia, oropharyngeal phase: Secondary | ICD-10-CM | POA: Diagnosis not present

## 2021-08-21 DIAGNOSIS — E119 Type 2 diabetes mellitus without complications: Secondary | ICD-10-CM | POA: Diagnosis not present

## 2021-08-21 DIAGNOSIS — N760 Acute vaginitis: Secondary | ICD-10-CM | POA: Diagnosis not present

## 2021-08-21 DIAGNOSIS — C20 Malignant neoplasm of rectum: Secondary | ICD-10-CM | POA: Diagnosis not present

## 2021-08-21 DIAGNOSIS — F39 Unspecified mood [affective] disorder: Secondary | ICD-10-CM | POA: Diagnosis not present

## 2021-08-21 DIAGNOSIS — D492 Neoplasm of unspecified behavior of bone, soft tissue, and skin: Secondary | ICD-10-CM | POA: Diagnosis not present

## 2021-08-21 DIAGNOSIS — R0683 Snoring: Secondary | ICD-10-CM | POA: Diagnosis not present

## 2021-08-21 DIAGNOSIS — N189 Chronic kidney disease, unspecified: Secondary | ICD-10-CM | POA: Diagnosis not present

## 2021-08-21 DIAGNOSIS — Z7401 Bed confinement status: Secondary | ICD-10-CM | POA: Diagnosis not present

## 2021-08-21 MED ORDER — PREDNISONE 10 MG PO TABS
ORAL_TABLET | ORAL | 0 refills | Status: AC
Start: 2021-08-21 — End: 2021-09-02

## 2021-08-21 MED ORDER — ADULT MULTIVITAMIN W/MINERALS CH: 1.0000 | ORAL_TABLET | Freq: Every day | ORAL | 0 refills | Status: AC

## 2021-08-21 MED ORDER — PANTOPRAZOLE SODIUM 40 MG PO TBEC: 40.0000 mg | DELAYED_RELEASE_TABLET | Freq: Two times a day (BID) | ORAL | 0 refills | Status: DC

## 2021-08-21 MED ORDER — METOPROLOL SUCCINATE ER 100 MG PO TB24: 100.0000 mg | ORAL_TABLET | Freq: Every day | ORAL | 0 refills | Status: DC

## 2021-08-21 MED ORDER — COLCHICINE 0.6 MG PO TABS
0.6000 mg | ORAL_TABLET | Freq: Every day | ORAL | Status: DC
Start: 2021-08-21 — End: 2021-08-22
  Filled 2021-08-21: qty 1

## 2021-08-21 MED ORDER — PREDNISONE 50 MG PO TABS: 50.0000 mg | ORAL_TABLET | Freq: Every day | ORAL | Status: DC

## 2021-08-21 MED ORDER — METHYLPREDNISOLONE SODIUM SUCC 125 MG IJ SOLR
125.0000 mg | Freq: Once | INTRAMUSCULAR | Status: AC
  Administered 2021-08-21: 125 mg via INTRAVENOUS
  Filled 2021-08-21: qty 2

## 2021-08-21 MED ORDER — COLCHICINE 0.6 MG PO TABS
1.2000 mg | ORAL_TABLET | ORAL | Status: AC
  Administered 2021-08-21: 1.2 mg via ORAL
  Filled 2021-08-21: qty 2

## 2021-08-21 MED ORDER — COLCHICINE 0.6 MG PO TABS: 0.6000 mg | ORAL_TABLET | Freq: Every day | ORAL | 0 refills | Status: AC

## 2021-08-21 MED ORDER — FLEET ENEMA 7-19 GM/118ML RE ENEM
1.0000 | ENEMA | Freq: Once | RECTAL | Status: AC
  Administered 2021-08-21: 1 via RECTAL
  Filled 2021-08-21: qty 1

## 2021-08-21 NOTE — Progress Notes (Signed)
Attempted to call report. On hold for 10 minutes. Will attempt again shortly.

## 2021-08-21 NOTE — TOC Transition Note (Signed)
Transition of Care [facility]) - CM/SW Discharge Note   Patient Details  Name: Tina Waller MRN: [id number] Date of Birth: 05-Nov-1940  Transition of Care [facility]) CM/SW Contact:  Milinda Antis, [place] Phone Number: 08/21/2021, 2:32 PM   Clinical Narrative:     Patient will DC to: [facility] date: 08/21/2021 Family notified:  yes Transport by:  Corey Harold   Per MD patient ready for DC to SNF. RN to call report prior to discharge [phone] room 706P. RN, patient, patient's family, and facility notified of DC. Discharge Summary and FL2 sent to facility. DC packet on chart. Ambulance will be transport requested for patient.   CSW will sign off for now as social work intervention is no longer needed. Please consult [place] again if new needs arise.    Final next level of care: Skilled Nursing Facility Barriers to Discharge: No Barriers Identified   Patient Goals and CMS Choice Patient states their goals for this hospitalization and ongoing recovery are:: To get better CMS Medicare.gov Compare Post Acute Care list provided to:: Patient Choice offered to / list presented to : Patient  Discharge Placement              Patient chooses bed at: [facility] Patient to be transferred to facility by: [place] Name of family member notified: Jacquline, Terrill (Son)   [phone] Patient and family notified of of transfer: 08/21/21  Discharge Plan and Services                                     Social Determinants of Health (SDOH) Interventions     Readmission Risk Interventions No flowsheet data found.

## 2021-08-21 NOTE — Progress Notes (Signed)
Phone report given to [facility] at [place]. Awaiting transportation by [facility].

## 2021-08-21 NOTE — Consult Note (Signed)
° °  [facility] Inpatient Consult   08/21/2021  Tina Waller 1941/06/28 [id number]  [name] [ACO] Patient: [employer] Medicare   Primary Care Provider:  [name], MD, [facility] and Wellness   Patient screened for length of stay hospitalization with notes reveals patient is transitioning to a skilled nursing facility level of care.   Plan:No [facility] Care Management follow up needs assessed as patient post hospital TOC needs are to be met at a skilled nursing facility level of care for rehab.  For questions contact:   Natividad Brood, RN BSN [facility] Liaison  [phone] business mobile phone Toll free office [phone]  Fax number: [phone] [email or web address]      .

## 2021-08-21 NOTE — Discharge Summary (Signed)
Physician Discharge Summary  Tina Waller IWL:[id number] DOB: 1941-01-01 DOA: 08/10/2021  PCP: [name], MD  Admit date: 08/10/2021 Discharge date: 08/21/2021  Admitted From: Home Disposition: SNF  Recommendations for Outpatient Follow-up:  Follow up with PCP in 1-2 weeks Please obtain BMP/CBC in one week Please follow up with GI and oncology as scheduled  Discharge Condition: Stable CODE STATUS: Full Diet recommendation: As tolerated  Brief/Interim Summary: Tina Waller is a 81 y.o. F with pHTN, HTN, CKD IIIB, hx stroke on Plavix, and hyperparathyoidism who presented with melena, cough. In the ER, found to have COVID, also hemoglobin trending down.  Started on PPI and admitted. She underwent EGD/colonoscopy and found with rectal mass.  Path revealed intramucosal carcinoma.  Oncology and colorectal surgery has been consulted -with plans to follow-up outpatient as pathology results.  Hospitalization complicated by incidental COVID-positive status as well as left foot gout flare.  Patient otherwise stable and agreeable for discharge to rehab for ongoing physical therapy with close outpatient follow-up as above with oncology and GI given hospital findings  Discharge Diagnoses:  Principal Problem:   GI bleed Active Problems:   History of CVA (cerebrovascular accident)   HTN (hypertension)   Insomnia   COVID-19 virus infection   Acute renal failure superimposed on stage 3 chronic kidney disease (HCC)   Thrombocytopenia (HCC)   GI bleeding   Protein-calorie malnutrition, severe   Rectal cancer ([place])  Discharge Instructions   Allergies as of 08/21/2021       Reactions   Lisinopril Cough   Carvedilol Diarrhea   Hydralazine    Vomiting and diarrhea   Lipitor [atorvastatin] Diarrhea        Medication List     STOP taking these medications    losartan 100 MG tablet Commonly known as: COZAAR       TAKE these medications    acetaminophen 500 MG  tablet Commonly known as: TYLENOL Take 500-1,000 mg by mouth every 6 (six) hours as needed for moderate pain or headache.   amLODipine 10 MG tablet Commonly known as: NORVASC TAKE 1 TABLET EVERY DAY   cholecalciferol 25 MCG (1000 UNIT) tablet Commonly known as: VITAMIN D3 Take 2 tablets (2,000 Units total) by mouth daily.   clopidogrel 75 MG tablet Commonly known as: PLAVIX TAKE 1 TABLET (75 MG TOTAL) BY MOUTH DAILY.   colchicine 0.6 MG tablet Take 1 tablet (0.6 mg total) by mouth daily. Start taking on: August 22, 2021   furosemide 40 MG tablet Commonly known as: LASIX TAKE 1 TABLET (40 MG TOTAL) BY MOUTH 2 (TWO) TIMES DAILY.   isosorbide mononitrate 60 MG 24 hr tablet Commonly known as: IMDUR Take 1.5 tablets (90 mg total) by mouth daily.   metoprolol succinate 100 MG 24 hr tablet Commonly known as: TOPROL-XL Take 1 tablet (100 mg total) by mouth daily. Take with or immediately following a meal. Start taking on: August 22, 2021 What changed:  medication strength additional instructions   mometasone 0.1 % ointment Commonly known as: ELOCON Apply topically daily.   multivitamin with minerals Tabs tablet Take 1 tablet by mouth daily. Start taking on: August 22, 2021   pantoprazole 40 MG tablet Commonly known as: PROTONIX Take 1 tablet (40 mg total) by mouth 2 (two) times daily.   polyethylene glycol 17 g packet Commonly known as: MiraLax Take 17 g by mouth daily as needed for mild constipation.   pravastatin 80 MG tablet Commonly known as: PRAVACHOL TAKE 1 TABLET  EVERY DAY FOR HIGH CHOLESTEROL What changed: See the new instructions.   predniSONE 10 MG tablet Commonly known as: DELTASONE Take 4 tablets (40 mg total) by mouth daily for 3 days, THEN 3 tablets (30 mg total) daily for 3 days, THEN 2 tablets (20 mg total) daily for 3 days, THEN 1 tablet (10 mg total) daily for 3 days. Start taking on: August 21, 2021   traZODone 100 MG tablet Commonly known  as: DESYREL Take 1 tablet (100 mg total) by mouth at bedtime as needed for sleep.   triamcinolone cream 0.1 % Commonly known as: KENALOG Apply 1 application topically 2 (two) times daily as needed (rash).   vitamin B-12 1000 MCG tablet Commonly known as: CYANOCOBALAMIN Take 1,000 mcg by mouth daily.        Allergies  Allergen Reactions   Lisinopril Cough   Carvedilol Diarrhea   Hydralazine     Vomiting and diarrhea   Lipitor [Atorvastatin] Diarrhea    Consultations: Oncology, GI, surgery   Procedures/Studies: US Abdomen Complete  Result Date: 08/12/2021 CLINICAL DATA:  Thrombocytopenia. EXAM: ABDOMEN ULTRASOUND COMPLETE COMPARISON:  Renal ultrasound 09/04/2017 FINDINGS: Gallbladder: No gallstones or gallbladder wall thickening. No pericholecystic fluid. The sonographer reports no sonographic Murphy's sign. Common bile duct: Diameter: 4 mm Liver: Tiny cyst noted right liver. Portal vein is patent on color Doppler imaging with normal direction of blood flow towards the liver. IVC: No abnormality visualized. Pancreas: Partially obscured by bowel gas. Spleen: Size and appearance within normal limits. 7.4 cm craniocaudal length. Right Kidney: Length: 9.5 cm 4-5 mm nonobstructing stone evident. 2.3 cm simple cyst. No hydronephrosis. Echogenicity diffusely increased. Left Kidney: Length: 9.9 cm. Diffusely increased echotexture. 2 cysts identified measuring up to 2.1 cm diameter. No hydronephrosis. Abdominal aorta: No aneurysm visualized. Other findings: None. IMPRESSION: 1. No evidence of splenomegaly. 2. Increased echotexture of renal parenchyma, a nonspecific finding which can be seen in medical renal disease. 3. 4-5 mm nonobstructing stone in the lower pole right kidney. Electronically Signed   By: Misty Stanley M.D.   On: 08/12/2021 07:48   CT CHEST ABDOMEN PELVIS W CONTRAST  Result Date: 08/16/2021 CLINICAL DATA:  Rectal cancer staging.  GI bleed. EXAM: CT CHEST, ABDOMEN, AND PELVIS  WITH CONTRAST TECHNIQUE: Multidetector CT imaging of the chest, abdomen and pelvis was performed following the standard protocol during bolus administration of intravenous contrast. RADIATION DOSE REDUCTION: This exam was performed according to the departmental dose-optimization program which includes automated exposure control, adjustment of the mA and/or kV according to patient size and/or use of iterative reconstruction technique. CONTRAST:  178mL OMNIPAQUE IOHEXOL 300 MG/ML  SOLN COMPARISON:  Abdominal ultrasound 08/12/2021, chest radiograph 08/13/2021 FINDINGS: CT CHEST FINDINGS Cardiovascular: Coronary, aortic arch, and branch vessel atherosclerotic vascular disease. Mediastinum/Nodes: 0.8 by 1.4 cm structure with internal density of 13 Hounsfield units which is at or near fluid density along the anterior pericardial margin, image 35 series 3, potentially a small pericardial cyst or an anterior pericardial lymph node. This is not felt to be pathologically enlarged. Lungs/Pleura: Small bilateral pleural effusions with passive atelectasis. Severe centrilobular emphysema. Biapical pleuroparenchymal scarring. Musculoskeletal: Mild dextroconvex thoracic scoliosis. CT ABDOMEN PELVIS FINDINGS Hepatobiliary: Nonspecific 0.6 by 0.5 cm hypodense lesion in segment 4a of the liver on image 53 series 3. Questionable 3 by 4 mm hypodense lesion in segment 3 of the liver on image 51 series 5. Contracted gallbladder. No biliary dilatation. Pancreas: 0.9 by 0.6 by 0.7 cm hypodense lesion anteriorly in the  pancreatic body on image 61 series 3. Spleen: Unremarkable Adrenals/Urinary Tract: Bilateral hypodense renal lesions favoring cysts, some of these are too small to characterize and we do not have precontrast images to compare. Adrenal glands unremarkable. Complex 0.9 cm lesion of the right kidney upper pole medially on image 95 series 5, possibly a complex cyst or a small mass. Urinary bladder unremarkable. There is some  localized left perirenal fluid particularly adjacent to a left kidney lower pole cyst for example on image 67 series 3, significance uncertain but local inflammation cannot be excluded. Some of this edema tracks along the left paracolic gutter. Stomach/Bowel: Abnormal indistinctly marginated mid to lower rectal wall thickening, image 88 series 6, compatible with a large rectal mass. Scattered sigmoid colon diverticula. No dilated bowel noted. Vascular/Lymphatic: Prominent systemic atherosclerosis. This includes substantial plaque for example at the origin of the SMA and renal arteries and elsewhere. If further workup of the patient's atherosclerosis is indicated, CT angiography would be recommended. I do not observe pathologic pelvic adenopathy. Reproductive: Uterus absent.  Adnexa unremarkable. Other: No supplemental non-categorized findings. Musculoskeletal: Mild levoconvex lumbar scoliosis. Bilateral degenerative hip arthropathy. Grade 1 degenerative anterolisthesis at L4-5. IMPRESSION: 1. Large rectal mass. Two tiny hypodense lesions in the liver are nonspecific with regard to benign etiology or metastatic disease. 2. 9 mm in long axis hypodense pancreatic body lesion. Possibilities include postinflammatory cyst, intraductal papillary mucinous neoplasm, or less likely pancreatic adenocarcinoma. 3. Bilateral renal cysts. A complex right kidney upper pole lesion is indeterminate for complex cyst versus mass and measures 9 mm in diameter. 4. In light of these findings, consider upper abdominal MRI with contrast to further characterize the hepatic, pancreatic, and renal lesions. 5. There is also unusual left perirenal stranding especially adjacent to a cyst along the left kidney lower pole, this could be an indicator of inflammation involving the left kidney but is technically nonspecific. Some of this edema tracks along the left paracolic gutter. 6. Advanced systemic atherosclerosis. This includes aortic and  coronary atherosclerosis along with mesenteric and renal atherosclerosis. If further workup of the patient's severe atherosclerotic vascular disease is warranted, CT angiography would be recommended. 7. Small bilateral pleural effusions with passive atelectasis. 8. Severe centrilobular emphysema.  Emphysema (ICD10-J43.9). 9. Mild thoracolumbar scoliosis. 10. Scattered sigmoid colon diverticula. Electronically Signed   By: Van Clines M.D.   On: 08/16/2021 15:53   DG CHEST PORT 1 VIEW  Result Date: 08/20/2021 CLINICAL DATA:  Fever EXAM: PORTABLE CHEST 1 VIEW COMPARISON:  08/13/2021 FINDINGS: Bilateral emphysematous changes. Bilateral chronic interstitial thickening. Left lower lobe airspace disease which may reflect atelectasis versus pneumonia. Small left pleural effusion. No pneumothorax. Stable cardiomediastinal silhouette. No aggressive osseous lesion. IMPRESSION: 1. Left lower lobe airspace disease which may reflect atelectasis versus pneumonia. Small left pleural effusion. 2. COPD. Electronically Signed   By: Kathreen Devoid M.D.   On: 08/20/2021 09:28   DG CHEST PORT 1 VIEW  Result Date: 08/13/2021 CLINICAL DATA:  Hypoxia. EXAM: PORTABLE CHEST 1 VIEW COMPARISON:  Chest x-ray 08/12/2021. FINDINGS: There are atherosclerotic calcifications of the aorta. Heart size is borderline enlarged, unchanged. There is some minimal strandy bibasilar opacities, left greater than right. There is no pleural effusion or pneumothorax. No acute fractures are seen. IMPRESSION: 1. Minimal bibasilar atelectasis/airspace disease. Electronically Signed   By: Ronney Asters M.D.   On: 08/13/2021 17:18   DG CHEST PORT 1 VIEW  Result Date: 08/12/2021 CLINICAL DATA:  COVID-19 positivity with cough, initial encounter EXAM: PORTABLE  CHEST 1 VIEW COMPARISON:  08/11/2021 FINDINGS: Cardiac shadow is stable. Aortic calcifications are noted. Lungs are well aerated bilaterally. No focal infiltrate or sizable effusion is seen. No bony  abnormality is noted. IMPRESSION: No acute abnormality noted. Electronically Signed   By: Inez Catalina M.D.   On: 08/12/2021 22:43   DG Chest Port 1 View  Result Date: 08/11/2021 CLINICAL DATA:  COVID-19 positivity with cough, initial encounter EXAM: PORTABLE CHEST 1 VIEW COMPARISON:  10/19/2007 FINDINGS: Cardiac shadow is within normal limits. A aortic calcifications are again seen and stable. The lungs are well aerated bilaterally. No focal infiltrate or sizable effusion is seen. No bony abnormality is noted. IMPRESSION: No acute abnormality seen. Electronically Signed   By: Inez Catalina M.D.   On: 08/11/2021 00:42   MR ABDOMEN MRCP W WO CONTAST  Result Date: 08/19/2021 CLINICAL DATA:  81 year old female with history of liver and pancreatic lesions noted on recent CT examination. Rectal cancer. Follow-up study. EXAM: MRI ABDOMEN WITHOUT AND WITH CONTRAST (INCLUDING MRCP) TECHNIQUE: Multiplanar multisequence MR imaging of the abdomen was performed both before and after the administration of intravenous contrast. Heavily T2-weighted images of the biliary and pancreatic ducts were obtained, and three-dimensional MRCP images were rendered by post processing. CONTRAST:  47mL GADAVIST GADOBUTROL 1 MMOL/ML IV SOLN COMPARISON:  No prior abdominal MRI. CT the chest, abdomen and pelvis 08/16/2021. FINDINGS: Lower chest: Small bilateral pleural effusions lying dependently with areas of increased signal intensity in the dependent portions of the lungs, poorly evaluated on today's MRI examination, but likely to represent areas of dependent subsegmental atelectasis. Hepatobiliary: Extensive susceptibility artifact centered in the region of the stomach completely obscures portions of the left lobe of the liver. With these limitations in mind there is a 7 mm T1 hypointense, T2 hyperintense, nonenhancing lesion in segment 4A of the liver, compatible with a simple hepatic cyst. No other aggressive appearing hepatic lesions.  No intra or extrahepatic biliary ductal dilatation. Amorphous high signal intensity lying dependently in the gallbladder on T1 weighted images, likely reflects some biliary sludge. Gallbladder is not distended. Gallbladder wall thickness appears normal. No pericholecystic fluid. Pancreas: Head and uncinate process of the pancreas are normal in appearance. Body and tail of the pancreas are largely obscured by beam hardening artifact emanating from the patient's stomach. Spleen:  Unremarkable. Adrenals/Urinary Tract: Multiple T1 hypointense, T2 hyperintense, nonenhancing lesions in both kidneys, compatible with simple cysts, largest of which is exophytic in the interpolar region of the right kidney measuring up to 2.3 cm in diameter. No hydroureteronephrosis in the visualized portions of the abdomen. Bilateral adrenal glands are normal in appearance. Stomach/Bowel: Extensive susceptibility artifact in the stomach, presumably related to the large surgical clip noted on the recent CT examination. Other visualized portions of small bowel and colon are grossly unremarkable in appearance. Vascular/Lymphatic: Aortic atherosclerosis, without definite aneurysm in the abdominal vasculature. No lymphadenopathy noted in the abdomen. Other: No significant volume of ascites noted in the visualized portions of the peritoneal cavity. Musculoskeletal: No aggressive appearing osseous lesions are noted in the visualized portions of the skeleton. Increased T2 signal intensity in the right psoas muscle where there are well-defined centrally low signal intensity regions with some peripheral enhancement on post gadolinium imaging, concerning for right-sided psoas abscesses or intramuscular metastasis. The largest of these (demonstrated on axial image 64 of series 28) measures 1.2 x 0.9 cm. IMPRESSION: 1. Study is severely limited by beam hardening artifact from a large surgical clip in the stomach,  which completely obscures the previously  noted pancreatic lesion on most pulse sequences rendering today's study nondiagnostic. The only visualized hepatic lesion is compatible with a small simple hepatic cyst. 2. There is abnormal signal intensity and small rim enhancing lesions in the right psoas muscle. Clinical correlation for signs and symptoms of psoas abscess is recommended. Alternatively, the possibility of intramuscular metastasis could be considered. 3. Small bilateral pleural effusions lying dependently with areas of presumed passive atelectasis in the lower lobes of the lungs bilaterally. 4. Multiple simple cysts in the kidneys bilaterally. 5. Aortic atherosclerosis. Electronically Signed   By: Vinnie Langton M.D.   On: 08/19/2021 08:23     Subjective: No acute issues or events overnight denies nausea vomiting diarrhea constipation any fevers chills or chest pain   Discharge Exam: Vitals:   08/20/21 2115 08/21/21 0451  BP: (!) 154/70 (!) 152/74  Pulse: (!) 106 (!) 103  Resp: 18 18  Temp: 98.9 F (37.2 C) 99.4 F (37.4 C)  SpO2: 95% (!) 87%   Vitals:   08/20/21 0859 08/20/21 1645 08/20/21 2115 08/21/21 0451  BP: 139/61 136/61 (!) 154/70 (!) 152/74  Pulse: 92 97 (!) 106 (!) 103  Resp: 16 18 18 18   Temp: 99.3 F (37.4 C) 99.4 F (37.4 C) 98.9 F (37.2 C) 99.4 F (37.4 C)  TempSrc: Oral Oral Oral Oral  SpO2: 95% 97% 95% (!) 87%  Weight:      Height:        General: Pt is alert, awake, not in acute distress Cardiovascular: RRR, S1/S2 +, no rubs, no gallops Respiratory: CTA bilaterally, no wheezing, no rhonchi Abdominal: Soft, NT, ND, bowel sounds + Extremities: no edema, no cyanosis    The results of significant diagnostics from this hospitalization (including imaging, microbiology, ancillary and laboratory) are listed below for reference.     Microbiology: Recent Results (from the past 240 hour(s))  Culture, blood (routine x 2)     Status: None (Preliminary result)   Collection Time: 08/20/21  9:29  AM   Specimen: BLOOD  Result Value Ref Range Status   Specimen Description BLOOD RIGHT ANTECUBITAL  Final   Special Requests   Final    BOTTLES DRAWN AEROBIC AND ANAEROBIC Blood Culture adequate volume   Culture   Final    NO GROWTH < 24 HOURS Performed at [facility], [address], [place], [place] [address]    Report Status PENDING  Incomplete  Culture, blood (routine x 2)     Status: None (Preliminary result)   Collection Time: 08/20/21  9:33 AM   Specimen: BLOOD RIGHT FOREARM  Result Value Ref Range Status   Specimen Description BLOOD RIGHT FOREARM  Final   Special Requests   Final    BOTTLES DRAWN AEROBIC AND ANAEROBIC Blood Culture results may not be optimal due to an inadequate volume of blood received in culture bottles   Culture   Final    NO GROWTH < 24 HOURS Performed at [facility], [place] [address], [place], [place] [address]    Report Status PENDING  Incomplete     Labs: BNP (last 3 results) No results for input(s): BNP in the last 8760 hours. Basic Metabolic Panel: Recent Labs  Lab 08/16/21 0401 08/17/21 0119  NA 144 144  K  --  4.0  CL  --  112*  CO2  --  23  GLUCOSE  --  94  BUN  --  7*  CREATININE  --  1.22*  CALCIUM  --  8.7*   Liver Function Tests: No results for input(s): AST, ALT, ALKPHOS, BILITOT, PROT, ALBUMIN in the last 168 hours. No results for input(s): LIPASE, AMYLASE in the last 168 hours. No results for input(s): AMMONIA in the last 168 hours. CBC: Recent Labs  Lab 08/20/21 0928  WBC 9.6  HGB 10.3*  HCT 30.1*  MCV 95.9  PLT 352   Cardiac Enzymes: No results for input(s): CKTOTAL, CKMB, CKMBINDEX, TROPONINI in the last 168 hours. BNP: Invalid input(s): POCBNP CBG: Recent Labs  Lab 08/19/21 2154  GLUCAP 103*   D-Dimer No results for input(s): DDIMER in the last 72 hours. Hgb A1c No results for input(s): HGBA1C in the last 72 hours. Lipid Profile No results for input(s): CHOL, HDL, LDLCALC, TRIG, CHOLHDL,  LDLDIRECT in the last 72 hours. Thyroid function studies No results for input(s): TSH, T4TOTAL, T3FREE, THYROIDAB in the last 72 hours.  Invalid input(s): FREET3 Anemia work up No results for input(s): VITAMINB12, FOLATE, FERRITIN, TIBC, IRON, RETICCTPCT in the last 72 hours. Urinalysis    Component Value Date/Time   COLORURINE STRAW (A) 09/29/2018 1243   APPEARANCEUR CLEAR 09/29/2018 1243   APPEARANCEUR Clear 09/24/2018 1228   LABSPEC 1.004 (L) 09/29/2018 1243   PHURINE 7.0 09/29/2018 1243   GLUCOSEU NEGATIVE 09/29/2018 1243   HGBUR SMALL (A) 09/29/2018 1243   BILIRUBINUR negative 07/26/2020 1503   BILIRUBINUR Negative 09/24/2018 1228   KETONESUR negative 07/26/2020 1503   KETONESUR NEGATIVE 09/29/2018 1243   PROTEINUR 100 (A) 09/29/2018 1243   UROBILINOGEN 0.2 07/26/2020 1503   UROBILINOGEN 0.2 02/01/2007 2102   NITRITE Negative 07/26/2020 1503   NITRITE NEGATIVE 09/29/2018 1243   LEUKOCYTESUR Trace (A) 07/26/2020 1503   LEUKOCYTESUR NEGATIVE 09/29/2018 1243   Sepsis Labs Invalid input(s): PROCALCITONIN,  WBC,  LACTICIDVEN Microbiology Recent Results (from the past 240 hour(s))  Culture, blood (routine x 2)     Status: None (Preliminary result)   Collection Time: 08/20/21  9:29 AM   Specimen: BLOOD  Result Value Ref Range Status   Specimen Description BLOOD RIGHT ANTECUBITAL  Final   Special Requests   Final    BOTTLES DRAWN AEROBIC AND ANAEROBIC Blood Culture adequate volume   Culture   Final    NO GROWTH < 24 HOURS Performed at [facility], [address], [place], [place] [address]    Report Status PENDING  Incomplete  Culture, blood (routine x 2)     Status: None (Preliminary result)   Collection Time: 08/20/21  9:33 AM   Specimen: BLOOD RIGHT FOREARM  Result Value Ref Range Status   Specimen Description BLOOD RIGHT FOREARM  Final   Special Requests   Final    BOTTLES DRAWN AEROBIC AND ANAEROBIC Blood Culture results may not be optimal due to an  inadequate volume of blood received in culture bottles   Culture   Final    NO GROWTH < 24 HOURS Performed at [facility], [place] [address], [place], [place] [address]    Report Status PENDING  Incomplete     Time coordinating discharge: Over 30 minutes  SIGNED:   Little Ishikawa, DO [facility] 08/21/2021, 12:12 PM Pager   If 7PM-7AM, please contact night-coverage [email or web address]

## 2021-08-22 ENCOUNTER — Telehealth: Payer: Self-pay | Admitting: Hematology

## 2021-08-22 DIAGNOSIS — U071 COVID-19: Secondary | ICD-10-CM | POA: Diagnosis not present

## 2021-08-22 DIAGNOSIS — G479 Sleep disorder, unspecified: Secondary | ICD-10-CM | POA: Diagnosis not present

## 2021-08-22 DIAGNOSIS — M109 Gout, unspecified: Secondary | ICD-10-CM | POA: Diagnosis not present

## 2021-08-22 DIAGNOSIS — D649 Anemia, unspecified: Secondary | ICD-10-CM | POA: Diagnosis not present

## 2021-08-22 DIAGNOSIS — F39 Unspecified mood [affective] disorder: Secondary | ICD-10-CM | POA: Diagnosis not present

## 2021-08-22 DIAGNOSIS — I129 Hypertensive chronic kidney disease with stage 1 through stage 4 chronic kidney disease, or unspecified chronic kidney disease: Secondary | ICD-10-CM | POA: Diagnosis not present

## 2021-08-22 DIAGNOSIS — J9601 Acute respiratory failure with hypoxia: Secondary | ICD-10-CM | POA: Diagnosis not present

## 2021-08-22 DIAGNOSIS — I272 Pulmonary hypertension, unspecified: Secondary | ICD-10-CM | POA: Diagnosis not present

## 2021-08-22 DIAGNOSIS — C20 Malignant neoplasm of rectum: Secondary | ICD-10-CM | POA: Diagnosis not present

## 2021-08-22 NOTE — Telephone Encounter (Signed)
Sch per 2/9 inbasket, pt aware °

## 2021-08-23 ENCOUNTER — Other Ambulatory Visit: Payer: Self-pay

## 2021-08-23 NOTE — Patient Outreach (Signed)
[facility]) Care Management  08/23/2021  GESENIA BANTZ 09-29-1940 [id number]     Transition of Care Referral  Referral Date: 08/23/2021 Referral Source: Discharge Report Date of Discharge: 08/21/2021 Facility: [facility]   Referral received. Upon chart review, noted that patient discharged from hospital to SNF/rehab.      Plan: RN CM will close referral.   Enzo Montgomery, RN,BSN,CCM [facility] Management Telephonic Care Management Coordinator Direct Phone: [phone] Toll Free: [phone] Fax: [phone]

## 2021-08-23 NOTE — Patient Outreach (Signed)
[name] Care Management  08/23/2021  Tina Waller [birth year] [id number]   [employer] member referral for transition of care needs. Request assigned to Enzo Montgomery, RN for follow up.  In[facility] Management Assistant [phone]

## 2021-08-25 LAB — CULTURE, BLOOD (ROUTINE X 2)
Culture: NO GROWTH
Culture: NO GROWTH
Special Requests: ADEQUATE

## 2021-08-26 DIAGNOSIS — C20 Malignant neoplasm of rectum: Secondary | ICD-10-CM | POA: Diagnosis not present

## 2021-08-26 DIAGNOSIS — I119 Hypertensive heart disease without heart failure: Secondary | ICD-10-CM | POA: Diagnosis not present

## 2021-08-26 DIAGNOSIS — K922 Gastrointestinal hemorrhage, unspecified: Secondary | ICD-10-CM | POA: Diagnosis not present

## 2021-08-26 DIAGNOSIS — U071 COVID-19: Secondary | ICD-10-CM | POA: Diagnosis not present

## 2021-08-26 DIAGNOSIS — E213 Hyperparathyroidism, unspecified: Secondary | ICD-10-CM | POA: Diagnosis not present

## 2021-08-26 DIAGNOSIS — F411 Generalized anxiety disorder: Secondary | ICD-10-CM | POA: Diagnosis not present

## 2021-08-26 DIAGNOSIS — M6281 Muscle weakness (generalized): Secondary | ICD-10-CM | POA: Diagnosis not present

## 2021-08-27 DIAGNOSIS — B9689 Other specified bacterial agents as the cause of diseases classified elsewhere: Secondary | ICD-10-CM | POA: Diagnosis not present

## 2021-08-27 DIAGNOSIS — J9601 Acute respiratory failure with hypoxia: Secondary | ICD-10-CM | POA: Diagnosis not present

## 2021-08-27 DIAGNOSIS — I10 Essential (primary) hypertension: Secondary | ICD-10-CM | POA: Diagnosis not present

## 2021-08-27 DIAGNOSIS — U071 COVID-19: Secondary | ICD-10-CM | POA: Diagnosis not present

## 2021-08-27 DIAGNOSIS — C20 Malignant neoplasm of rectum: Secondary | ICD-10-CM | POA: Diagnosis not present

## 2021-08-27 DIAGNOSIS — Z1331 Encounter for screening for depression: Secondary | ICD-10-CM | POA: Diagnosis not present

## 2021-08-27 DIAGNOSIS — N760 Acute vaginitis: Secondary | ICD-10-CM | POA: Diagnosis not present

## 2021-08-27 DIAGNOSIS — M109 Gout, unspecified: Secondary | ICD-10-CM | POA: Diagnosis not present

## 2021-08-28 DIAGNOSIS — C20 Malignant neoplasm of rectum: Secondary | ICD-10-CM | POA: Diagnosis not present

## 2021-08-28 DIAGNOSIS — M6281 Muscle weakness (generalized): Secondary | ICD-10-CM | POA: Diagnosis not present

## 2021-08-28 DIAGNOSIS — D500 Iron deficiency anemia secondary to blood loss (chronic): Secondary | ICD-10-CM | POA: Diagnosis not present

## 2021-08-28 DIAGNOSIS — E213 Hyperparathyroidism, unspecified: Secondary | ICD-10-CM | POA: Diagnosis not present

## 2021-08-28 DIAGNOSIS — M109 Gout, unspecified: Secondary | ICD-10-CM | POA: Diagnosis not present

## 2021-08-28 DIAGNOSIS — U071 COVID-19: Secondary | ICD-10-CM | POA: Diagnosis not present

## 2021-08-28 DIAGNOSIS — F411 Generalized anxiety disorder: Secondary | ICD-10-CM | POA: Diagnosis not present

## 2021-08-28 DIAGNOSIS — I119 Hypertensive heart disease without heart failure: Secondary | ICD-10-CM | POA: Diagnosis not present

## 2021-08-28 DIAGNOSIS — K922 Gastrointestinal hemorrhage, unspecified: Secondary | ICD-10-CM | POA: Diagnosis not present

## 2021-08-29 ENCOUNTER — Encounter (HOSPITAL_COMMUNITY): Payer: Self-pay | Admitting: *Deleted

## 2021-08-29 NOTE — Progress Notes (Signed)
Have attempted multiple times to follow up with patient after hospital discharge.  Have left messages with no response.

## 2021-08-30 ENCOUNTER — Telehealth: Payer: Self-pay | Admitting: *Deleted

## 2021-08-30 NOTE — Telephone Encounter (Signed)
Attempted multiple times to follow up with Tina Waller  by telephone to verify understanding of discharge instructions status post their most recent discharge from the hospital on the date:  08/21/21.  Have been unable to reach patient or her listed contacts.  She has follow up scheduled for 2/23 for lab and ov with Dr. Burr Medico, which were listed on discharge AVS.

## 2021-09-02 DIAGNOSIS — C20 Malignant neoplasm of rectum: Secondary | ICD-10-CM | POA: Diagnosis not present

## 2021-09-02 DIAGNOSIS — U071 COVID-19: Secondary | ICD-10-CM | POA: Diagnosis not present

## 2021-09-02 DIAGNOSIS — M6281 Muscle weakness (generalized): Secondary | ICD-10-CM | POA: Diagnosis not present

## 2021-09-02 DIAGNOSIS — K922 Gastrointestinal hemorrhage, unspecified: Secondary | ICD-10-CM | POA: Diagnosis not present

## 2021-09-02 DIAGNOSIS — M109 Gout, unspecified: Secondary | ICD-10-CM | POA: Diagnosis not present

## 2021-09-02 DIAGNOSIS — F411 Generalized anxiety disorder: Secondary | ICD-10-CM | POA: Diagnosis not present

## 2021-09-02 DIAGNOSIS — I119 Hypertensive heart disease without heart failure: Secondary | ICD-10-CM | POA: Diagnosis not present

## 2021-09-02 DIAGNOSIS — E213 Hyperparathyroidism, unspecified: Secondary | ICD-10-CM | POA: Diagnosis not present

## 2021-09-04 ENCOUNTER — Other Ambulatory Visit: Payer: Self-pay

## 2021-09-04 DIAGNOSIS — C20 Malignant neoplasm of rectum: Secondary | ICD-10-CM | POA: Diagnosis not present

## 2021-09-04 DIAGNOSIS — M6281 Muscle weakness (generalized): Secondary | ICD-10-CM | POA: Diagnosis not present

## 2021-09-04 DIAGNOSIS — F411 Generalized anxiety disorder: Secondary | ICD-10-CM | POA: Diagnosis not present

## 2021-09-04 DIAGNOSIS — I119 Hypertensive heart disease without heart failure: Secondary | ICD-10-CM | POA: Diagnosis not present

## 2021-09-04 DIAGNOSIS — E213 Hyperparathyroidism, unspecified: Secondary | ICD-10-CM | POA: Diagnosis not present

## 2021-09-04 DIAGNOSIS — K922 Gastrointestinal hemorrhage, unspecified: Secondary | ICD-10-CM | POA: Diagnosis not present

## 2021-09-04 DIAGNOSIS — M109 Gout, unspecified: Secondary | ICD-10-CM | POA: Diagnosis not present

## 2021-09-04 DIAGNOSIS — U071 COVID-19: Secondary | ICD-10-CM | POA: Diagnosis not present

## 2021-09-04 NOTE — Progress Notes (Signed)
The proposed treatment discussed in conference is for discussion purpose only and is not a binding recommendation.  The patients have not been physically examined, or presented with their treatment options.  Therefore, final treatment plans cannot be decided.  

## 2021-09-05 ENCOUNTER — Inpatient Hospital Stay (HOSPITAL_BASED_OUTPATIENT_CLINIC_OR_DEPARTMENT_OTHER): Payer: Medicare HMO | Admitting: Hematology

## 2021-09-05 ENCOUNTER — Inpatient Hospital Stay: Payer: Medicare HMO | Attending: Hematology

## 2021-09-05 ENCOUNTER — Other Ambulatory Visit: Payer: Self-pay

## 2021-09-05 ENCOUNTER — Encounter: Payer: Self-pay | Admitting: Hematology

## 2021-09-05 VITALS — BP 102/56 | HR 74 | Temp 98.5°F | Resp 18 | Ht 69.0 in | Wt 124.0 lb

## 2021-09-05 DIAGNOSIS — C20 Malignant neoplasm of rectum: Secondary | ICD-10-CM | POA: Diagnosis not present

## 2021-09-05 DIAGNOSIS — Z8616 Personal history of COVID-19: Secondary | ICD-10-CM | POA: Insufficient documentation

## 2021-09-05 DIAGNOSIS — Z79899 Other long term (current) drug therapy: Secondary | ICD-10-CM | POA: Diagnosis not present

## 2021-09-05 DIAGNOSIS — R5383 Other fatigue: Secondary | ICD-10-CM | POA: Diagnosis not present

## 2021-09-05 DIAGNOSIS — D508 Other iron deficiency anemias: Secondary | ICD-10-CM | POA: Diagnosis not present

## 2021-09-05 DIAGNOSIS — Z9981 Dependence on supplemental oxygen: Secondary | ICD-10-CM | POA: Insufficient documentation

## 2021-09-05 DIAGNOSIS — N189 Chronic kidney disease, unspecified: Secondary | ICD-10-CM | POA: Insufficient documentation

## 2021-09-05 DIAGNOSIS — D631 Anemia in chronic kidney disease: Secondary | ICD-10-CM | POA: Insufficient documentation

## 2021-09-05 DIAGNOSIS — K922 Gastrointestinal hemorrhage, unspecified: Secondary | ICD-10-CM

## 2021-09-05 DIAGNOSIS — D500 Iron deficiency anemia secondary to blood loss (chronic): Secondary | ICD-10-CM | POA: Diagnosis not present

## 2021-09-05 LAB — CBC WITH DIFFERENTIAL (CANCER CENTER ONLY)
Abs Immature Granulocytes: 0.01 10*3/uL (ref 0.00–0.07)
Basophils Absolute: 0 10*3/uL (ref 0.0–0.1)
Basophils Relative: 0 %
Eosinophils Absolute: 0.2 10*3/uL (ref 0.0–0.5)
Eosinophils Relative: 3 %
HCT: 30.6 % — ABNORMAL LOW (ref 36.0–46.0)
Hemoglobin: 9.9 g/dL — ABNORMAL LOW (ref 12.0–15.0)
Immature Granulocytes: 0 %
Lymphocytes Relative: 27 %
Lymphs Abs: 1.4 10*3/uL (ref 0.7–4.0)
MCH: 31.5 pg (ref 26.0–34.0)
MCHC: 32.4 g/dL (ref 30.0–36.0)
MCV: 97.5 fL (ref 80.0–100.0)
Monocytes Absolute: 0.8 10*3/uL (ref 0.1–1.0)
Monocytes Relative: 15 %
Neutro Abs: 3 10*3/uL (ref 1.7–7.7)
Neutrophils Relative %: 55 %
Platelet Count: 240 10*3/uL (ref 150–400)
RBC: 3.14 MIL/uL — ABNORMAL LOW (ref 3.87–5.11)
RDW: 13.2 % (ref 11.5–15.5)
WBC Count: 5.4 10*3/uL (ref 4.0–10.5)
nRBC: 0 % (ref 0.0–0.2)

## 2021-09-05 LAB — CMP (CANCER CENTER ONLY)
ALT: 10 U/L (ref 0–44)
AST: 14 U/L — ABNORMAL LOW (ref 15–41)
Albumin: 3.1 g/dL — ABNORMAL LOW (ref 3.5–5.0)
Alkaline Phosphatase: 51 U/L (ref 38–126)
Anion gap: 6 (ref 5–15)
BUN: 15 mg/dL (ref 8–23)
CO2: 30 mmol/L (ref 22–32)
Calcium: 8.6 mg/dL — ABNORMAL LOW (ref 8.9–10.3)
Chloride: 105 mmol/L (ref 98–111)
Creatinine: 1.63 mg/dL — ABNORMAL HIGH (ref 0.44–1.00)
GFR, Estimated: 32 mL/min — ABNORMAL LOW (ref >60)
Glucose, Bld: 107 mg/dL — ABNORMAL HIGH (ref 70–99)
Potassium: 3.9 mmol/L (ref 3.5–5.1)
Sodium: 141 mmol/L (ref 135–145)
Total Bilirubin: 0.5 mg/dL (ref 0.3–1.2)
Total Protein: 5.7 g/dL — ABNORMAL LOW (ref 6.5–8.1)

## 2021-09-05 LAB — IRON AND IRON BINDING CAPACITY (CC-WL,HP ONLY)
Iron: 72 ug/dL (ref 28–170)
Saturation Ratios: 46 % — ABNORMAL HIGH (ref 10.4–31.8)
TIBC: 158 ug/dL — ABNORMAL LOW (ref 250–450)
UIBC: 86 ug/dL — ABNORMAL LOW (ref 148–442)

## 2021-09-05 LAB — SAMPLE TO BLOOD BANK

## 2021-09-05 LAB — CEA (IN HOUSE-CHCC): CEA (CHCC-In House): 3.25 ng/mL (ref 0.00–5.00)

## 2021-09-05 LAB — FERRITIN: Ferritin: 179 ng/mL (ref 11–307)

## 2021-09-05 MED ORDER — FERROUS GLUCONATE 324 (38 FE) MG PO TABS: 324.0000 mg | ORAL_TABLET | Freq: Every day | ORAL | 0 refills | Status: AC

## 2021-09-05 NOTE — Progress Notes (Signed)
[name]   [phone] [phone]   Clinic Follow up Note   Patient Care Team: [name], MD as PCP - General (Internal Medicine) Truitt Merle, MD as Consulting Physician (Hematology)  Date of Service:  09/05/2021  CHIEF COMPLAINT: f/u of rectal cancer  CURRENT THERAPY:  Pending  ASSESSMENT & PLAN:  Tina Waller is a 81 y.o. female with   1. Rectal Cancer, stage I c(T2, N0)M0 -presented to ED on 08/10/21 with weakness and black stool. Colonoscopy and EGD on 08/15/21 by Dr. Rush Landmark showed a palpable, partially obstructing rectal mass. Path confirmed fragments of adenocarcinoma with multifocal high-grade dysplasia and focally suspicious for intramucosal carcinoma. -CT CAP on 08/16/21 showed: two tiny nonspecific liver lesions; 9 mm hypodense pancreatic body lesion. MRCP on 08/18/21 showed hepatic lesion compatible with simple hepatic cyst, within limitation of beam hardening artifact, and small lesions in right psoas muscle. -baseline CEA on 08/16/21 was normal at 3.7. -staging pelvis MRI on 08/21/21 showed stage T2 N0 rectal cancer. -I reviewed the work up thus far with her. We discussed potential treatment options. She has not yet met with a colorectal surgeon. Given her clinical T2N0 cancer, the standard treatment is upfront surgery (likely low anterior resection).  If she is not a candidate for surgery or if she denies, we may consider transanal resection, or concurrent chemoradiation.  Her case was discussed in our GI tumor board yesterday, she will be referred to see one of our colorectal surgeons. -Patient appears to be frail, she is participating physical therapy in the rehab, needs assistance for even walking.  She lives alone, with very limited social support.  -Patient is reluctant to do surgery, but agrees to meet a surgeon to discuss options. -I will see her back if she does not proceed with surgery.  2. Weakness and fatigue, recent  COVID  -she is now unable to walk independently and requires oxygen since hospitalization. She is currently at a rehab facility. -she endorses participating in PT at the facility.  3. Anemia -secondary to GI blood loss and CKD -hgb 9.9 today (09/05/21). I recommend she take oral iron supplement.  4. Social Support -she previously lived independently and did not require assistance. -she is currently in a rehab facility but hopes to return home after. -she has a son but no other nearby friends or relatives.   PLAN: -I reviewed her cancer diagnosis, staging, and treatment options. -We will refer her to see colorectal surgeon, patient is reluctant to have surgery, but agrees with consultation. -Continue PT OT at the rehab -I will see her back if she does not proceed with surgery.   No problem-specific Assessment & Plan notes found for this encounter.   SUMMARY OF ONCOLOGIC HISTORY: Oncology History  Rectal cancer ([place])  08/15/2021 Cancer Staging   Staging form: Colon and Rectum, AJCC 8th Edition - Clinical stage from 08/15/2021: Stage I (cT2, cN0, cM0) - Signed by Truitt Merle, MD on 09/05/2021 Stage prefix: Initial diagnosis Total positive nodes: 0    08/15/2021 Procedure   Colonoscopy, Dr. Rush Landmark  Impression: - Palpable rectal mass found on digital rectal exam. - The examined portion of the ileum was normal. - Four 3 to 7 mm polyps at the recto-sigmoid colon, in the descending colon and in the ascending colon, removed with a cold snare. Resected and retrieved. - Rule out malignancy, partially obstructing tumor in the proximal/middle rectum. Biopsied. Tattooed proximal to lesion. - Diverticulosis in the entire examined  colon. - Normal mucosa in the entire examined colon otherwise. - Non-bleeding non-thrombosed external and internal hemorrhoids.   08/15/2021 Initial Biopsy   FINAL MICROSCOPIC DIAGNOSIS:   A. STOMACH, BIOPSY:  - Gastric antral and oxyntic mucosa with nonspecific  reactive  gastropathy  - Helicobacter pylori-like organisms are not identified on routine HE stain   B. DUODENUM, BIOPSY:  - Duodenal mucosa with no specific histopathologic changes  - Negative for increased intraepithelial lymphocytes or villous  architectural changes   C. COLON, ASCENDING, DESCENDING AND SIGMOID, POLYPECTOMY:  - Tubular adenoma(s)  - Negative for high-grade dysplasia or malignancy   D. RECTAL MASS, BIOPSY:  - Fragments of an adenoma with multifocal high-grade dysplasia, focally suspicious for intramucosal carcinoma.  See comment    COMMENT:  D.  A deeper, more severe process cannot be ruled out.  Clinical and endoscopic correlation is suggested.    08/16/2021 Imaging   EXAM: CT CHEST, ABDOMEN, AND PELVIS WITH CONTRAST  IMPRESSION: 1. Large rectal mass. Two tiny hypodense lesions in the liver are nonspecific with regard to benign etiology or metastatic disease. 2. 9 mm in long axis hypodense pancreatic body lesion. Possibilities include postinflammatory cyst, intraductal papillary mucinous neoplasm, or less likely pancreatic adenocarcinoma. 3. Bilateral renal cysts. A complex right kidney upper pole lesion is indeterminate for complex cyst versus mass and measures 9 mm in diameter. 4. In light of these findings, consider upper abdominal MRI with contrast to further characterize the hepatic, pancreatic, and renal lesions. 5. There is also unusual left perirenal stranding especially adjacent to a cyst along the left kidney lower pole, this could be an indicator of inflammation involving the left kidney but is technically nonspecific. Some of this edema tracks along the left paracolic gutter. 6. Advanced systemic atherosclerosis. This includes aortic and coronary atherosclerosis along with mesenteric and renal atherosclerosis. If further workup of the patient's severe atherosclerotic vascular disease is warranted, CT angiography would be recommended. 7. Small  bilateral pleural effusions with passive atelectasis. 8. Severe centrilobular emphysema.  Emphysema (ICD10-J43.9). 9. Mild thoracolumbar scoliosis. 10. Scattered sigmoid colon diverticula.   08/18/2021 Imaging   EXAM: MRI ABDOMEN WITHOUT AND WITH CONTRAST (INCLUDING MRCP)  IMPRESSION: 1. Study is severely limited by beam hardening artifact from a large surgical clip in the stomach, which completely obscures the previously noted pancreatic lesion on most pulse sequences rendering today's study nondiagnostic. The only visualized hepatic lesion is compatible with a small simple hepatic cyst. 2. There is abnormal signal intensity and small rim enhancing lesions in the right psoas muscle. Clinical correlation for signs and symptoms of psoas abscess is recommended. Alternatively, the possibility of intramuscular metastasis could be considered. 3. Small bilateral pleural effusions lying dependently with areas of presumed passive atelectasis in the lower lobes of the lungs bilaterally. 4. Multiple simple cysts in the kidneys bilaterally. 5. Aortic atherosclerosis.   08/20/2021 Initial Diagnosis   Rectal cancer ([place])   08/21/2021 Imaging   EXAM: MRI PELVIS WITHOUT CONTRAST  IMPRESSION: Rectal adenocarcinoma T stage: T2   Rectal adenocarcinoma N stage:  N0   Distance from tumor to the internal anal sphincter is 4.5 cm.      INTERVAL HISTORY:  Tina Waller is here for a follow up of rectal cancer. She was last seen by me on 08/19/21 while she was in the hospital. She presents to the clinic alone. She notes she is currently in a rehab facility. She reports she requires assistance to walk and oxygen since her hospital  admission. She reports a small amount of rectal bleeding. She also reports loose stool/diarrhea, adding fiber will cause urgency.    All other systems were reviewed with the patient and are negative.  MEDICAL HISTORY:  Past Medical History:  Diagnosis Date   Anemia     Anxiety    Cataract    Depression    Diabetes mellitus without complication ([place])    pt denies   GERD (gastroesophageal reflux disease)    Gout    Headache    Hypertension    Stroke [facility]) 2008   09/14/2018   Tonsillar mass    Type 2 diabetes mellitus ([place]) 09/14/2018   Vertigo    Wears dentures    upper   Wears glasses     SURGICAL HISTORY: Past Surgical History:  Procedure Laterality Date   ABDOMINAL HYSTERECTOMY     BIOPSY  08/15/2021   Procedure: BIOPSY;  Surgeon: Irving Copas., MD;  Location: [facility] ENDOSCOPY;  Service: Gastroenterology;;   BREAST SURGERY     lumpectomy   BUNIONECTOMY     CATARACT EXTRACTION Left    COLONOSCOPY W/ BIOPSIES AND POLYPECTOMY     COLONOSCOPY WITH PROPOFOL N/A 08/15/2021   Procedure: COLONOSCOPY WITH PROPOFOL;  Surgeon: Irving Copas., MD;  Location: [place];  Service: Gastroenterology;  Laterality: N/A;   ESOPHAGOGASTRODUODENOSCOPY (EGD) WITH PROPOFOL N/A 08/15/2021   Procedure: ESOPHAGOGASTRODUODENOSCOPY (EGD) WITH PROPOFOL;  Surgeon: Rush Landmark Telford Nab., MD;  Location: [place];  Service: Gastroenterology;  Laterality: N/A;   HEMOSTASIS CLIP PLACEMENT  08/15/2021   Procedure: HEMOSTASIS CLIP PLACEMENT;  Surgeon: Irving Copas., MD;  Location: [place];  Service: Gastroenterology;;   HOT HEMOSTASIS N/A 08/15/2021   Procedure: HOT HEMOSTASIS (ARGON PLASMA COAGULATION/BICAP);  Surgeon: Irving Copas., MD;  Location: [place];  Service: Gastroenterology;  Laterality: N/A;   MULTIPLE TOOTH EXTRACTIONS     POLYPECTOMY  08/15/2021   Procedure: POLYPECTOMY;  Surgeon: Mansouraty, Telford Nab., MD;  Location: [place];  Service: Gastroenterology;;   SUBMUCOSAL TATTOO INJECTION  08/15/2021   Procedure: SUBMUCOSAL TATTOO INJECTION;  Surgeon: Irving Copas., MD;  Location: [place];  Service: Gastroenterology;;   TONGUE BIOPSY Right 12/13/2018   Procedure: RIGHT TONSIL BIOPSY;  Surgeon: Melida Quitter,  MD;  Location: [place];  Service: ENT;  Laterality: Right;    I have reviewed the social history and family history with the patient and they are unchanged from previous note.  ALLERGIES:  is allergic to lisinopril, carvedilol, hydralazine, and lipitor [atorvastatin].  MEDICATIONS:  Current Outpatient Medications  Medication Sig Dispense Refill   acetaminophen (TYLENOL) 500 MG tablet Take 500-1,000 mg by mouth every 6 (six) hours as needed for moderate pain or headache.     amLODipine (NORVASC) 10 MG tablet TAKE 1 TABLET EVERY DAY (Patient taking differently: Take 10 mg by mouth daily.) 90 tablet 0   cholecalciferol (VITAMIN D3) 25 MCG (1000 UNIT) tablet Take 2 tablets (2,000 Units total) by mouth daily. 100 tablet 1   clopidogrel (PLAVIX) 75 MG tablet TAKE 1 TABLET (75 MG TOTAL) BY MOUTH DAILY. 90 tablet 0   colchicine 0.6 MG tablet Take 1 tablet (0.6 mg total) by mouth daily. 14 tablet 0   ferrous gluconate (FERGON) 324 MG tablet Take 1 tablet (324 mg total) by mouth daily with breakfast. 30 tablet 0   furosemide (LASIX) 40 MG tablet TAKE 1 TABLET (40 MG TOTAL) BY MOUTH 2 (TWO) TIMES DAILY. 180 tablet 0   isosorbide mononitrate (IMDUR) 60  MG 24 hr tablet Take 1.5 tablets (90 mg total) by mouth daily. 135 tablet 3   levalbuterol (XOPENEX HFA) 45 MCG/ACT inhaler Inhale into the lungs.     metoprolol succinate (TOPROL-XL) 100 MG 24 hr tablet Take 1 tablet (100 mg total) by mouth daily. Take with or immediately following a meal. 30 tablet 0   mometasone (ELOCON) 0.1 % ointment Apply topically daily. 45 g 0   Multiple Vitamin (MULTIVITAMIN WITH MINERALS) TABS tablet Take 1 tablet by mouth daily. 30 tablet 0   pantoprazole (PROTONIX) 40 MG tablet Take 1 tablet (40 mg total) by mouth 2 (two) times daily. 60 tablet 0   polyethylene glycol (MIRALAX) packet Take 17 g by mouth daily as needed for mild constipation.     pravastatin (PRAVACHOL) 80 MG tablet TAKE 1 TABLET EVERY DAY FOR HIGH CHOLESTEROL  (Patient taking differently: Take 80 mg by mouth daily.) 90 tablet 0   SPIRIVA RESPIMAT 2.5 MCG/ACT AERS SMARTSIG:2 Puff(s) Via Inhaler Daily     traZODone (DESYREL) 50 MG tablet Take 50 mg by mouth at bedtime.     vitamin B-12 (CYANOCOBALAMIN) 1000 MCG tablet Take 1,000 mcg by mouth daily.     No current facility-administered medications for this visit.    PHYSICAL EXAMINATION: ECOG PERFORMANCE STATUS: 3 - Symptomatic, >50% confined to bed  Vitals:   09/05/21 0942  BP: (!) 102/56  Pulse: 74  Resp: 18  Temp: 98.5 F (36.9 C)  SpO2: 100%   Wt Readings from Last 3 Encounters:  09/05/21 124 lb (56.2 kg)  08/11/21 127 lb 13.9 oz (58 kg)  04/19/21 133 lb 6.4 oz (60.5 kg)     GENERAL:alert, no distress and comfortable SKIN: skin color normal, no rashes or significant lesions EYES: normal, Conjunctiva are pink and non-injected, sclera clear  NEURO: alert & oriented x 3 with fluent speech  LABORATORY DATA:  I have reviewed the data as listed CBC Latest Ref Rng & Units 09/05/2021 08/20/2021 08/14/2021  WBC 4.0 - 10.5 K/uL 5.4 9.6 4.6  Hemoglobin 12.0 - 15.0 g/dL 9.9(L) 10.3(L) 10.7(L)  Hematocrit 36.0 - 46.0 % 30.6(L) 30.1(L) 32.1(L)  Platelets 150 - 400 K/uL 240 352 188     CMP Latest Ref Rng & Units 09/05/2021 08/17/2021 08/16/2021  Glucose 70 - 99 mg/dL 107(H) 94 -  BUN 8 - 23 mg/dL 15 7(L) -  Creatinine 0.44 - 1.00 mg/dL 1.63(H) 1.22(H) -  Sodium 135 - 145 mmol/L 141 144 144  Potassium 3.5 - 5.1 mmol/L 3.9 4.0 -  Chloride 98 - 111 mmol/L 105 112(H) -  CO2 22 - 32 mmol/L 30 23 -  Calcium 8.9 - 10.3 mg/dL 8.6(L) 8.7(L) -  Total Protein 6.5 - 8.1 g/dL 5.7(L) - -  Total Bilirubin 0.3 - 1.2 mg/dL 0.5 - -  Alkaline Phos 38 - 126 U/L 51 - -  AST 15 - 41 U/L 14(L) - -  ALT 0 - 44 U/L 10 - -      RADIOGRAPHIC STUDIES: I have personally reviewed the radiological images as listed and agreed with the findings in the report. No results found.    Orders Placed This Encounter   Procedures   CEA (IN HOUSE-[facility])    Standing Status:   Standing    Number of Occurrences:   15    Standing Expiration Date:   09/05/2022   Ambulatory referral to General Surgery    Referral Priority:   Routine    Referral Type:  Surgical    Referral Reason:   Specialty Services Required    Requested Specialty:   General Surgery    Number of Visits Requested:   1   All questions were answered. The patient knows to call the clinic with any problems, questions or concerns. No barriers to learning was detected. The total time spent in the appointment was 40 minutes.     Truitt Merle, MD 09/05/2021   I, Wilburn Mylar, am acting as scribe for Truitt Merle, MD.   I have reviewed the above documentation for accuracy and completeness, and I agree with the above.

## 2021-09-06 NOTE — Progress Notes (Signed)
I met with Ms Cabanilla before her hospital f/u appt with Dr Burr Medico.  I explained my role as a nurse navigator and provided my contact information.  I explained the services provided at [facility] and provided written information.  Currently she is at [facility] for rehabilitation.  She and I will reevaluate alight services when she is at home. All questions were answered.  She verbalized understanding.

## 2021-09-06 NOTE — Progress Notes (Signed)
Referral, ov note, demographics and insurance information faxed to [facility].

## 2021-09-09 ENCOUNTER — Telehealth: Payer: Self-pay

## 2021-09-09 DIAGNOSIS — M109 Gout, unspecified: Secondary | ICD-10-CM | POA: Diagnosis not present

## 2021-09-09 DIAGNOSIS — F411 Generalized anxiety disorder: Secondary | ICD-10-CM | POA: Diagnosis not present

## 2021-09-09 DIAGNOSIS — I119 Hypertensive heart disease without heart failure: Secondary | ICD-10-CM | POA: Diagnosis not present

## 2021-09-09 DIAGNOSIS — J9601 Acute respiratory failure with hypoxia: Secondary | ICD-10-CM | POA: Diagnosis not present

## 2021-09-09 DIAGNOSIS — D649 Anemia, unspecified: Secondary | ICD-10-CM | POA: Diagnosis not present

## 2021-09-09 DIAGNOSIS — K922 Gastrointestinal hemorrhage, unspecified: Secondary | ICD-10-CM | POA: Diagnosis not present

## 2021-09-09 DIAGNOSIS — Z8679 Personal history of other diseases of the circulatory system: Secondary | ICD-10-CM | POA: Diagnosis not present

## 2021-09-09 DIAGNOSIS — C20 Malignant neoplasm of rectum: Secondary | ICD-10-CM | POA: Diagnosis not present

## 2021-09-09 DIAGNOSIS — M6281 Muscle weakness (generalized): Secondary | ICD-10-CM | POA: Diagnosis not present

## 2021-09-09 DIAGNOSIS — N760 Acute vaginitis: Secondary | ICD-10-CM | POA: Diagnosis not present

## 2021-09-09 DIAGNOSIS — U071 COVID-19: Secondary | ICD-10-CM | POA: Diagnosis not present

## 2021-09-09 DIAGNOSIS — N1832 Chronic kidney disease, stage 3b: Secondary | ICD-10-CM | POA: Diagnosis not present

## 2021-09-09 DIAGNOSIS — E213 Hyperparathyroidism, unspecified: Secondary | ICD-10-CM | POA: Diagnosis not present

## 2021-09-09 NOTE — Telephone Encounter (Signed)
LVM stating that Dr. Burr Medico has review pt's lab drawn on Thursday.  Informed pt that her anemia, kidney function, and iron levels were stable; therefore, Dr. Burr Medico has no further concerns regarding these results for now.  Dr. Burr Medico will continue to monitor the pt's labs and will make the necessary adjustments when needed.  Instructed pt to please contact Dr. Ernestina Penna office should she have further questions or concerns regarding her labs.

## 2021-09-10 DIAGNOSIS — R0683 Snoring: Secondary | ICD-10-CM | POA: Diagnosis not present

## 2021-09-10 DIAGNOSIS — G4730 Sleep apnea, unspecified: Secondary | ICD-10-CM | POA: Diagnosis not present

## 2021-09-11 ENCOUNTER — Telehealth: Payer: Self-pay | Admitting: Internal Medicine

## 2021-09-11 NOTE — Telephone Encounter (Signed)
?  Home Health Verbal Orders - Caller/Agency: [facility] ?Callback Number:[phone] ? ?Requesting 1.Social work eval ?2. PT ?Frequency: 1 wk 9 ? ?

## 2021-09-11 NOTE — Telephone Encounter (Signed)
Returned [place] call and made aware that we haven't seen pt since 04/2021 and she will need to have a visit in order for [place] to provider verbal orders. Will contact pt to schedule  ?

## 2021-09-16 ENCOUNTER — Ambulatory Visit: Payer: Medicare HMO | Attending: Internal Medicine | Admitting: Internal Medicine

## 2021-09-16 ENCOUNTER — Telehealth: Payer: Self-pay | Admitting: Internal Medicine

## 2021-09-16 ENCOUNTER — Other Ambulatory Visit: Payer: Self-pay

## 2021-09-16 DIAGNOSIS — Z09 Encounter for follow-up examination after completed treatment for conditions other than malignant neoplasm: Secondary | ICD-10-CM | POA: Diagnosis not present

## 2021-09-16 DIAGNOSIS — C20 Malignant neoplasm of rectum: Secondary | ICD-10-CM | POA: Diagnosis not present

## 2021-09-16 DIAGNOSIS — J9691 Respiratory failure, unspecified with hypoxia: Secondary | ICD-10-CM

## 2021-09-16 DIAGNOSIS — E43 Unspecified severe protein-calorie malnutrition: Secondary | ICD-10-CM

## 2021-09-16 DIAGNOSIS — R54 Age-related physical debility: Secondary | ICD-10-CM

## 2021-09-16 DIAGNOSIS — I10 Essential (primary) hypertension: Secondary | ICD-10-CM

## 2021-09-16 NOTE — Telephone Encounter (Signed)
Returned call and gave verbal orders  ?

## 2021-09-16 NOTE — Telephone Encounter (Signed)
Home Health Verbal Orders - Caller/Agency: [facility] ?Callback Number:[phone] ?Requesting OT ?Frequency: 1x4 ?

## 2021-09-16 NOTE — Progress Notes (Signed)
Patient ID: Tina Waller, female   DOB: 01-Jul-1941, 81 y.o.   MRN: [id number] ?Virtual Visit via Telephone Note ? ?I connected with Tina Waller on 09/16/2021 at 1:49 PM by telephone and verified that I am speaking with the correct person using two identifiers ? ?Location: ?Patient: home ?Provider: office ? ?Participants: ?Myself ?Patient ? ?  ?I discussed the limitations, risks, security and privacy concerns of performing an evaluation and management service by telephone and the availability of in person appointments. I also discussed with the patient that there may be a patient responsible charge related to this service. The patient expressed understanding and agreed to proceed. ? ? ?History of Present Illness: ?Patient with history of HTN, CKD 3b with nephrotic range proteinuria (patient has declined biopsy ), HL, vit D deficiency, osteoporosis on Dexa scan done fall 2017, primary hyperparathyroidism,  depression, CVA in 2008 and 09/2018, vertebral artery stenosis and gout. ? ?This is a visit for hospital follow-up. ?Patient hospitalized 1/28-08/21/2021 with melena and cough.  Found to be COVID-positive and hemoglobin trending down.  It was previously 13.7 but it decreased to 10.3 by the time of discharge.  She did not require transfusion.  Had EGD/colonoscopy.  Found to have rectal mass.  Pathology revealed fragments of an adenoma with multifocal high-grade dysplasia, focally suspicious for intramucosal carcinoma.  Oncology and colorectal surgery were consulted and plans were to follow-up with her as an outpatient.  Hospital course also complicated by flare of gout in the left foot for which she was placed on prednisone. ?Patient was sent to [facility] for skilled nursing.  She was discharged 09/11/2021. ? ?Today: ? ?Pt dischg 09/11/2021.  She feels her legs are still weak.  She lives alone.  She has a son who lives in [place] who will be coming to check on her once a week.  She was  discharged with a walker and wheelchair.  However the wheelchair cannot fit through her bedroom door or bathroom door.  She has a walk-in shower with a shower chair so she has not had problems getting a bath.  She has had problems with meal preps because she has to hold on to the walker and try to use her other hand.  She inquired about Meals on Wheels prior to leaving the nursing home but was told that there is a long list.  She can microwave frozen meals.  She is not able to drive at this time.  [facility].  They were waiting for this appointment to get her approved for home PT/OT.  Patient feels she would also benefit from having a home health aide but does not know that she would be able to afford it.  She has [employer] and not Medicaid. ? ?Rectal cancer: Since discharge she saw Dr. Burr Medico.  Plan is for patient to see the surgeon Dr. Marcello Moores.  Patient has not made up her mind fully about whether she would want surgery done.  Patient started on iron supplement which she has been taking.  She reports blood in the stools with each bowel movements.  She endorses weakness and shortness of breath if she overexerts herself. ? ?She tells me she is on home O2.  She is not sure how many liters.  She thinks it is 1-1/2 L and she uses it continuously.  She reports the cough that she has had from [place] has resolved. ? ?HTN: Reports blood pressure has  been good.  Blood pressure this morning was 125/71.  No changes made in blood pressure medication upon discharge from the nursing home.  She endorses lower extremity edema.  She tries to keep the legs elevated. ?Outpatient Encounter Medications as of 09/16/2021  ?Medication Sig  ? acetaminophen (TYLENOL) 500 MG tablet Take 500-1,000 mg by mouth every 6 (six) hours as needed for moderate pain or headache.  ? amLODipine (NORVASC) 10 MG tablet TAKE 1 TABLET EVERY DAY (Patient taking differently: Take 10 mg by mouth daily.)  ?  cholecalciferol (VITAMIN D3) 25 MCG (1000 UNIT) tablet Take 2 tablets (2,000 Units total) by mouth daily.  ? clopidogrel (PLAVIX) 75 MG tablet TAKE 1 TABLET (75 MG TOTAL) BY MOUTH DAILY.  ? colchicine 0.6 MG tablet Take 1 tablet (0.6 mg total) by mouth daily.  ? ferrous gluconate (FERGON) 324 MG tablet Take 1 tablet (324 mg total) by mouth daily with breakfast.  ? furosemide (LASIX) 40 MG tablet TAKE 1 TABLET (40 MG TOTAL) BY MOUTH 2 (TWO) TIMES DAILY.  ? isosorbide mononitrate (IMDUR) 60 MG 24 hr tablet Take 1.5 tablets (90 mg total) by mouth daily.  ? levalbuterol (XOPENEX HFA) 45 MCG/ACT inhaler Inhale into the lungs.  ? metoprolol succinate (TOPROL-XL) 100 MG 24 hr tablet Take 1 tablet (100 mg total) by mouth daily. Take with or immediately following a meal.  ? mometasone (ELOCON) 0.1 % ointment Apply topically daily.  ? Multiple Vitamin (MULTIVITAMIN WITH MINERALS) TABS tablet Take 1 tablet by mouth daily.  ? pantoprazole (PROTONIX) 40 MG tablet Take 1 tablet (40 mg total) by mouth 2 (two) times daily.  ? polyethylene glycol (MIRALAX) packet Take 17 g by mouth daily as needed for mild constipation.  ? pravastatin (PRAVACHOL) 80 MG tablet TAKE 1 TABLET EVERY DAY FOR HIGH CHOLESTEROL (Patient taking differently: Take 80 mg by mouth daily.)  ? SPIRIVA RESPIMAT 2.5 MCG/ACT AERS SMARTSIG:2 Puff(s) Via Inhaler Daily  ? traZODone (DESYREL) 50 MG tablet Take 50 mg by mouth at bedtime.  ? vitamin B-12 (CYANOCOBALAMIN) 1000 MCG tablet Take 1,000 mcg by mouth daily.  ? ?No facility-administered encounter medications on file as of 09/16/2021.  ? ? ?  ?Observations/Objective: ?Lab Results  ?Component Value Date  ? WBC 5.4 09/05/2021  ? HGB 9.9 (L) 09/05/2021  ? HCT 30.6 (L) 09/05/2021  ? MCV 97.5 09/05/2021  ? PLT 240 09/05/2021  ? ?  Chemistry   ?   ?Component Value Date/Time  ? NA 141 09/05/2021 0902  ? NA 142 04/19/2021 1459  ? K 3.9 09/05/2021 0902  ? CL 105 09/05/2021 0902  ? CO2 30 09/05/2021 0902  ? BUN 15 09/05/2021  0902  ? BUN 37 (H) 04/19/2021 1459  ? CREATININE 1.63 (H) 09/05/2021 0902  ? CREATININE 1.35 (H) 04/21/2016 1437  ?    ?Component Value Date/Time  ? CALCIUM 8.6 (L) 09/05/2021 0902  ? ALKPHOS 51 09/05/2021 0902  ? AST 14 (L) 09/05/2021 0902  ? ALT 10 09/05/2021 0902  ? BILITOT 0.5 09/05/2021 0902  ?  ? ?Lab Results  ?Component Value Date  ? IRON 72 09/05/2021  ? TIBC 158 (L) 09/05/2021  ? FERRITIN 179 09/05/2021  ? ? ? ?Assessment and Plan: ?1. Hospital discharge follow-up ? ?2. Frail elderly ?3. Protein-calorie malnutrition, severe ?-Patient is frail and elderly.  She lacks good social support. ?Home health agency will be going in to do PT/OT.  She would benefit from having a home health aide to assist  with meal preps..  I will have our caseworker look into this or see whether she can get Meals on Wheels  ? ?4. Rectal cancer ([place]) ?Patient plans to meet with the surgeon but states that transportation will have to be arranged.  According to the oncologist, if she is not a candidate for surgery or if she declines, they can consider transanal resection or concurrent chemoradiation ? ?5. Respiratory failure with hypoxia, unspecified chronicity ([place]) ?We will check her once I see her in the office to determine need for ongoing O2. ? ?6. Essential hypertension ?Per her report blood pressure has been good at home.  She will continue her current medications. ? ? ?Follow Up Instructions: ?7 wks ?  ?I discussed the assessment and treatment plan with the patient. The patient was provided an opportunity to ask questions and all were answered. The patient agreed with the plan and demonstrated an understanding of the instructions. ?  ?The patient was advised to call back or seek an in-person evaluation if the symptoms worsen or if the condition fails to improve as anticipated. ? ?I  Spent 18 minutes on this telephone encounter ? ?This note has been created with Surveyor, quantity. Any  transcriptional errors are unintentional. ? ?Karle Plumber, MD ? ?

## 2021-09-16 NOTE — Progress Notes (Signed)
I spoke with Ms Tina Waller this am.  She is now home from rehab.  I have reached out to [facility] to reschedule her appt with Dr Tina Waller that she missed on 09/03/2021.  I also asked if they had transportation assistance as [name] does not have transportation. ?

## 2021-09-23 ENCOUNTER — Other Ambulatory Visit: Payer: Self-pay

## 2021-09-23 ENCOUNTER — Ambulatory Visit: Payer: Medicare HMO | Admitting: Physician Assistant

## 2021-09-23 DIAGNOSIS — R6889 Other general symptoms and signs: Secondary | ICD-10-CM | POA: Diagnosis not present

## 2021-09-23 DIAGNOSIS — C20 Malignant neoplasm of rectum: Secondary | ICD-10-CM | POA: Diagnosis not present

## 2021-09-26 ENCOUNTER — Telehealth: Payer: Self-pay

## 2021-09-26 LAB — SURGICAL PATHOLOGY

## 2021-09-26 NOTE — Telephone Encounter (Signed)
At request of Dr Wynetta Emery, call placed to patient to inquire about services she may need at home. ?She lives alone and said that she does not need any assistance with personal care.  She is interested in help with homemaking and meal prep. Explained to her that I will make a referral to Meals on Wheels and I can refer her to [employer] requesting  meal delivery for 6 months or until she receives MOW. I explained to her that [employer] ships 14 meals/month at one time and the patient said she would have room in her freezer.  ? ?Referral sent to [employer]  @ [facility], awaiting decision if patient will be accepted  ? ?Call placed to [facility], message left with call back requested to this CM so patient can be placed on wait list for MOW ? ?Message sent to [facility], RN Navigator inquiring about possible resources for patient.  ? ? ?

## 2021-10-01 ENCOUNTER — Telehealth: Payer: Self-pay | Admitting: Radiation Oncology

## 2021-10-01 DIAGNOSIS — I10 Essential (primary) hypertension: Secondary | ICD-10-CM | POA: Diagnosis not present

## 2021-10-01 DIAGNOSIS — C20 Malignant neoplasm of rectum: Secondary | ICD-10-CM | POA: Diagnosis not present

## 2021-10-01 NOTE — Telephone Encounter (Signed)
LVM for Danae Chen about pt needing transport for consultation ?

## 2021-10-02 NOTE — Progress Notes (Signed)
I spoke with Ms Hollon and reviewed her upcoming appts at [facility].  She is requesting transportation service.  I sent an e-mail Christian Vilsaint requesting services ?

## 2021-10-03 NOTE — Telephone Encounter (Signed)
Patient has been approved for [employer] ?

## 2021-10-04 NOTE — Telephone Encounter (Signed)
Call placed to [facility] regarding MOW. Message left with call back requested to this CM so patient can be placed on MOW wait list.  ? ? ? ?Call placed to patient and informed her that she has been approved for [employer] and should expect a delivery of 14 frozen meals as soon as this afternoon, but ost likely next week.  She was very Patent attorney. I also explained that I am still in the process of trying to get her on the MOW wait list.  ?

## 2021-10-07 NOTE — Progress Notes (Signed)
?Radiation Oncology         [phone] ?________________________________ ? ?Name: Tina Waller        MRN: [id number]  ?Date of Service: 10/08/2021 DOB: 1940/09/19 ? ?FU:XNATFTD, Dalbert Batman, MD  Truitt Merle, MD    ? ?REFERRING PHYSICIAN: Truitt Merle, MD ? ? ?DIAGNOSIS: The encounter diagnosis was Rectal cancer ([place]). ? ? ?HISTORY OF PRESENT ILLNESS: Tina Waller is a 81 y.o. female seen at the request of Dr. Burr Medico for a new diagnosis of rectal cancer. Patient had been experiencing symptoms of weakness and black tarry stools and presented in January 2023 to the emergency department.  During her hospitalization which overlapped into early February she underwent colonoscopy and EGD on 08/15/2021 with Dr. Stefani Dama Roddy she had a palpable rectal mass on digital rectal exam, for small polyps at the rectosigmoid colon descending colon and ascending colon, diverticulosis and nonbleeding thrombosed external and internal hemorrhoids.  The rectal mass was biopsied as well as the other polyps.  Her upper endoscopy showed no gross lesions in the esophagus and nonobstructing Schatzki ring her Z-line was irregular at 39 cm from the incisors with a single nonbleeding angiectasia in the stomach nonbleeding linear gastric ulcers with clean ulcerative base and gastritis appearing changes were noted.  No lesions were seen in the duodenum or second portion of the duodenum.  Final pathology from her procedures showed gastric antral and oxyntic mucosa with nonspecific reactive gastropathy, no histopathologic change in the duodenum, tubular adenomatous polyps negative for dysplasia or malignancy in the a sending descending and sigmoid colon polyp specimens and her rectal mass biopsy had fragments of adenoma with multifocal high-grade dysplasia focally suspicious for intramucosal carcinoma.  MMR testing was felt to be normal.  She underwent staging imaging on 08/16/2021 with CT scan of the chest abdomen and pelvis which showed a large  rectal mass with 2 tiny hypodense lesions in the liver, a 9 mm long axis hypodense pancreatic body lesion, bilateral renal cysts and a right complex upper pole renal lesion measuring 9 mm.  Renal stranding, small bilateral effusions in the pleural or pleural spaces with passive atelectasis severe centrilobular emphysema thoracolumbar scoliosis and scattered sigmoid colon diverticula.  An MRI of that abdomen for more clarification was performed on 08/18/2021, and her surgical clip in the stomach limited the ability to fully assess the pancreas however the only visualized hepatic lesion was compatible with a small simple hepatic cyst and there was abnormal signaling intensity and small rim-enhancing lesions in the right psoas muscle she did undergo an MRI of the pelvis on 08/21/2021 which staged her cancer as T2N0 4.5 cm from the internal anal sphincter, she had pelvic floor laxity small bilateral fat-containing inguinal hernias, and her case has been discussed in multidisciplinary GI oncology conference.  She is somewhat reluctant to consider surgery but if she does not proceed with surgical resection she would benefit from chemoradiation.  She did end up seeing Dr. Leighton Ruff and she was a candidate for a low anterior resection with curative intent however the patient would be benefited by permanent colostomy, she wanted to consider her options and is seen today to discuss alternatives of chemoradiation.    ? ? ? ?PREVIOUS RADIATION THERAPY: No ? ? ?PAST MEDICAL HISTORY:  ?Past Medical History:  ?Diagnosis Date  ? Anemia   ? Anxiety   ? Cataract   ? Depression   ? Diabetes mellitus without complication ([place])   ? pt denies  ? GERD (  gastroesophageal reflux disease)   ? Gout   ? Headache   ? Hypertension   ? Stroke [facility]) 2008  ? 09/14/2018  ? Tonsillar mass   ? Type 2 diabetes mellitus ([place]) 09/14/2018  ? Vertigo   ? Wears dentures   ? upper  ? Wears glasses   ?   ? ? ?PAST SURGICAL HISTORY: ?Past Surgical History:   ?Procedure Laterality Date  ? ABDOMINAL HYSTERECTOMY    ? BIOPSY  08/15/2021  ? Procedure: BIOPSY;  Surgeon: Irving Copas., MD;  Location: [place];  Service: Gastroenterology;;  ? BREAST SURGERY    ? lumpectomy  ? BUNIONECTOMY    ? CATARACT EXTRACTION Left   ? COLONOSCOPY W/ BIOPSIES AND POLYPECTOMY    ? COLONOSCOPY WITH PROPOFOL N/A 08/15/2021  ? Procedure: COLONOSCOPY WITH PROPOFOL;  Surgeon: Mansouraty, Telford Nab., MD;  Location: [place];  Service: Gastroenterology;  Laterality: N/A;  ? ESOPHAGOGASTRODUODENOSCOPY (EGD) WITH PROPOFOL N/A 08/15/2021  ? Procedure: ESOPHAGOGASTRODUODENOSCOPY (EGD) WITH PROPOFOL;  Surgeon: Rush Landmark Telford Nab., MD;  Location: [place];  Service: Gastroenterology;  Laterality: N/A;  ? HEMOSTASIS CLIP PLACEMENT  08/15/2021  ? Procedure: HEMOSTASIS CLIP PLACEMENT;  Surgeon: Irving Copas., MD;  Location: [place];  Service: Gastroenterology;;  ? HOT HEMOSTASIS N/A 08/15/2021  ? Procedure: HOT HEMOSTASIS (ARGON PLASMA COAGULATION/BICAP);  Surgeon: Irving Copas., MD;  Location: [place];  Service: Gastroenterology;  Laterality: N/A;  ? MULTIPLE TOOTH EXTRACTIONS    ? POLYPECTOMY  08/15/2021  ? Procedure: POLYPECTOMY;  Surgeon: Mansouraty, Telford Nab., MD;  Location: [place];  Service: Gastroenterology;;  ? SUBMUCOSAL TATTOO INJECTION  08/15/2021  ? Procedure: SUBMUCOSAL TATTOO INJECTION;  Surgeon: Irving Copas., MD;  Location: [place];  Service: Gastroenterology;;  ? TONGUE BIOPSY Right 12/13/2018  ? Procedure: RIGHT TONSIL BIOPSY;  Surgeon: Melida Quitter, MD;  Location: [place];  Service: ENT;  Laterality: Right;  ? ? ? ?FAMILY HISTORY:  ?Family History  ?Problem Relation Age of Onset  ? Heart disease Mother   ?     CHF  ? Osteoporosis Neg Hx   ? Colon cancer Neg Hx   ? Stomach cancer Neg Hx   ? Pancreatic cancer Neg Hx   ? ? ? ?SOCIAL HISTORY:  reports that she quit smoking about 15 years ago. Her smoking use included cigarettes.  She has never used smokeless tobacco. She reports current alcohol use of about 4.0 standard drinks per week. She reports that she does not use drugs. ? ? ?ALLERGIES: Lisinopril, Carvedilol, Hydralazine, and Lipitor [atorvastatin] ? ? ?MEDICATIONS:  ?Current Outpatient Medications  ?Medication Sig Dispense Refill  ? acetaminophen (TYLENOL) 500 MG tablet Take 500-1,000 mg by mouth every 6 (six) hours as needed for moderate pain or headache.    ? amLODipine (NORVASC) 10 MG tablet TAKE 1 TABLET EVERY DAY (Patient taking differently: Take 10 mg by mouth daily.) 90 tablet 0  ? cholecalciferol (VITAMIN D3) 25 MCG (1000 UNIT) tablet Take 2 tablets (2,000 Units total) by mouth daily. 100 tablet 1  ? clopidogrel (PLAVIX) 75 MG tablet TAKE 1 TABLET (75 MG TOTAL) BY MOUTH DAILY. 90 tablet 0  ? colchicine 0.6 MG tablet Take 1 tablet (0.6 mg total) by mouth daily. 14 tablet 0  ? ferrous gluconate (FERGON) 324 MG tablet Take 1 tablet (324 mg total) by mouth daily with breakfast. 30 tablet 0  ? furosemide (LASIX) 40 MG tablet TAKE 1 TABLET (40 MG TOTAL) BY MOUTH 2 (TWO) TIMES DAILY. [place]  tablet 0  ? isosorbide mononitrate (IMDUR) 60 MG 24 hr tablet Take 1.5 tablets (90 mg total) by mouth daily. 135 tablet 3  ? levalbuterol (XOPENEX HFA) 45 MCG/ACT inhaler Inhale into the lungs.    ? metoprolol succinate (TOPROL-XL) 100 MG 24 hr tablet Take 1 tablet (100 mg total) by mouth daily. Take with or immediately following a meal. 30 tablet 0  ? mometasone (ELOCON) 0.1 % ointment Apply topically daily. 45 g 0  ? pravastatin (PRAVACHOL) 80 MG tablet TAKE 1 TABLET EVERY DAY FOR HIGH CHOLESTEROL (Patient taking differently: Take 80 mg by mouth daily.) 90 tablet 0  ? traZODone (DESYREL) 50 MG tablet Take 50 mg by mouth at bedtime.    ? vitamin B-12 (CYANOCOBALAMIN) 1000 MCG tablet Take 1,000 mcg by mouth daily.    ? Multiple Vitamin (MULTIVITAMIN WITH MINERALS) TABS tablet Take 1 tablet by mouth daily. (Patient not taking: Reported on 10/08/2021)  30 tablet 0  ? pantoprazole (PROTONIX) 40 MG tablet Take 1 tablet (40 mg total) by mouth 2 (two) times daily. (Patient not taking: Reported on 10/08/2021) 60 tablet 0  ? polyethylene glycol (MIRALAX) packet [place]

## 2021-10-07 NOTE — Progress Notes (Signed)
GI Location of Tumor / Histology: Rectal Cancer ? ?Tina Waller presented to the emergency room with complaints of weakness and a 4 day history of black stools. ? ?MRI Pelvis 08/21/2021: Rectal adenocarcinoma T stage: T2 ?  ?Rectal adenocarcinoma N stage:  N0 ?  ?Distance from tumor to the internal anal sphincter is 4.5 cm. ? ?MRCP 08/18/2021: Severely limited study by beam hardening artifact from a large surgical clip in the stomach which completely obscures the previously noted pancreatic lesion and the only visualized hepatic lesion is compatible with a small simple hepatic cyst.  Abnormal signal intensity and small rim enhancing lesions in the right psoas muscle which raises the possibility of psoas abscess versus intramuscular metastasis. ? ?CT CAP 08/16/2021: Large rectal mass, 2 tiny hypodense lesions in the liver which are nonspecific, 9 mm hypodense pancreatic body lesion which could be a postinflammatory cyst, intraductal papillary mucinous neoplasm, or less likely pancreatic adenocarcinoma. ? ?EGD 08/15/2021: No gross lesions in the esophagus, a single non-bleeding angio-ectasia in the stomach which was treated, non-bleeding linear gastric ulcers with clean ulcer base which were biopsied.  Stomach biopsy and duodenal biopsy negative for malignancy. ? ?Colonoscopy 08/15/2021: Palpable rectal mass and a partially obstructing tumor in the proximal/middle rectum. ? ? ?Biopsies of Rectal Mass 08/15/2021 ? ? ? ?Past/Anticipated interventions by surgeon, if any:  ?Dr. Marcello Moores 09/23/2021 ?-We discussed that a low anterior resection would be curative. ?-Due to her comorbidities and age, she would only be a candidate for permanent colostomy.  ?-Patient is very concerned about undergoing any surgery in her condition and would like to discuss this with her family.  ?-We also discussed alternative treatments including chemotherapy and radiation or doing nothing. ?-Patient would like to think about this and discuss with her  family and she will call the office if she would like to schedule any further surgery. ? ? ?Past/Anticipated interventions by medical oncology, if any:  ?Dr. Burr Medico 09/05/2021 ?-I reviewed the work up thus far with her. We discussed potential treatment options. She has not yet met with a colorectal surgeon. Given her clinical T2N0 cancer, the standard treatment is upfront surgery (likely low anterior resection).  If she is not a candidate for surgery or if she denies, we may consider transanal resection, or concurrent chemoradiation.  ?-Her case was discussed in our GI tumor board yesterday, she will be referred to see one of our colorectal surgeons. ?--Patient appears to be frail, she is participating physical therapy in the rehab, needs assistance for even walking.  She lives alone, with very limited social support.  ?-Patient is reluctant to do surgery, but agrees to meet a surgeon to discuss options. ?-I will see her back if she does not proceed with surgery. ?-Follow-up 10/09/2021 ? ? ?Weight changes, if any: Lost about 6 pounds in the last month. ? ?Bowel/Bladder complaints, if any: Denies constipation.  She reports loose bowels, occasionally black stools. Denies bladder changes. ? ?Nausea / Vomiting, if any: No ? ?Pain issues, if any: No  ? ?Any blood per rectum: She reports bright red blood per rectum when wiping.   ? ?SAFETY ISSUES: ?Prior radiation? No ?Pacemaker/ICD? No ?Possible current pregnancy? Hysterectomy ?Is the patient on methotrexate? No ? ?Current Complaints/Details: ? ?

## 2021-10-08 ENCOUNTER — Encounter: Payer: Self-pay | Admitting: Radiation Oncology

## 2021-10-08 ENCOUNTER — Other Ambulatory Visit: Payer: Self-pay

## 2021-10-08 ENCOUNTER — Inpatient Hospital Stay: Payer: Medicare HMO | Attending: Hematology

## 2021-10-08 ENCOUNTER — Ambulatory Visit
Admission: RE | Admit: 2021-10-08 | Discharge: 2021-10-08 | Disposition: A | Discharge status: Home / Self Care | Payer: Medicare HMO | Source: Ambulatory Visit | Attending: Radiation Oncology | Admitting: Radiation Oncology

## 2021-10-08 VITALS — BP 189/95 | HR 97 | Temp 96.7°F | Resp 18 | Ht 69.0 in | Wt 128.1 lb

## 2021-10-08 DIAGNOSIS — Z933 Colostomy status: Secondary | ICD-10-CM | POA: Insufficient documentation

## 2021-10-08 DIAGNOSIS — I10 Essential (primary) hypertension: Secondary | ICD-10-CM | POA: Insufficient documentation

## 2021-10-08 DIAGNOSIS — Z8673 Personal history of transient ischemic attack (TIA), and cerebral infarction without residual deficits: Secondary | ICD-10-CM | POA: Diagnosis not present

## 2021-10-08 DIAGNOSIS — M109 Gout, unspecified: Secondary | ICD-10-CM | POA: Insufficient documentation

## 2021-10-08 DIAGNOSIS — Z87891 Personal history of nicotine dependence: Secondary | ICD-10-CM | POA: Insufficient documentation

## 2021-10-08 DIAGNOSIS — E1136 Type 2 diabetes mellitus with diabetic cataract: Secondary | ICD-10-CM | POA: Diagnosis not present

## 2021-10-08 DIAGNOSIS — Z79899 Other long term (current) drug therapy: Secondary | ICD-10-CM | POA: Diagnosis not present

## 2021-10-08 DIAGNOSIS — C20 Malignant neoplasm of rectum: Secondary | ICD-10-CM | POA: Diagnosis not present

## 2021-10-08 DIAGNOSIS — R600 Localized edema: Secondary | ICD-10-CM | POA: Diagnosis not present

## 2021-10-08 DIAGNOSIS — Z7951 Long term (current) use of inhaled steroids: Secondary | ICD-10-CM | POA: Insufficient documentation

## 2021-10-08 DIAGNOSIS — K219 Gastro-esophageal reflux disease without esophagitis: Secondary | ICD-10-CM | POA: Diagnosis not present

## 2021-10-09 ENCOUNTER — Encounter: Payer: Self-pay | Admitting: Hematology

## 2021-10-09 ENCOUNTER — Other Ambulatory Visit: Payer: Self-pay | Admitting: Radiation Oncology

## 2021-10-09 ENCOUNTER — Ambulatory Visit: Payer: Self-pay | Admitting: *Deleted

## 2021-10-09 ENCOUNTER — Other Ambulatory Visit (HOSPITAL_COMMUNITY): Payer: Self-pay

## 2021-10-09 ENCOUNTER — Telehealth: Payer: Self-pay

## 2021-10-09 ENCOUNTER — Inpatient Hospital Stay (HOSPITAL_BASED_OUTPATIENT_CLINIC_OR_DEPARTMENT_OTHER): Payer: Medicare HMO | Admitting: Hematology

## 2021-10-09 DIAGNOSIS — C20 Malignant neoplasm of rectum: Secondary | ICD-10-CM | POA: Diagnosis not present

## 2021-10-09 MED ORDER — CAPECITABINE 500 MG PO TABS
1000.0000 mg | ORAL_TABLET | Freq: Two times a day (BID) | ORAL | 1 refills | Status: DC
  Filled 2021-10-09: qty 60, 15d supply, fill #0

## 2021-10-09 MED ORDER — ONDANSETRON HCL 4 MG PO TABS: 4.0000 mg | ORAL_TABLET | Freq: Three times a day (TID) | ORAL | 0 refills | Status: AC | PRN

## 2021-10-09 NOTE — Telephone Encounter (Signed)
Call received from Alexis/[facility] [place]. Placed patient on wait list for MOW.  Alexis had no information about how long the wait would be.  ?

## 2021-10-09 NOTE — Telephone Encounter (Signed)
?  Summary: swollen legs and feet  ? Marking high priority  ? ?----- Message from Tina Waller sent at 10/09/2021  3:22 PM EDT -----  ?Pt called in and states left leg and knee is swollen bigger than the right.   ?  ? ? ? ?Chief Complaint: bilateral leg swelling ?Symptoms: feet to knees swollen. Pain standing of feet can walk. Tender to touch. ?Frequency: 3 weeks ago  ?Pertinent Negatives: Patient denies chest pain, difficulty breathing, no fever, no wheeping fluid from legs ?Disposition: '[]'$ ED /'[]'$ Urgent Care (no appt availability in office) / '[]'$ Appointment(In office/virtual)/ '[]'$  [facility]/ '[]'$ Home Care/ '[]'$ Refused Recommended Disposition /'[x]'$ [facility]/ PCE with Carrolyn Meiers, PA '[]'$  Follow-up with PCP ?Additional Notes:  ? ?Patient already taking lasix 40 mg 2 times a day .   ? ? ?Reason for Disposition ? [1] MODERATE leg swelling (e.g., swelling extends up to knees) AND [2] new-onset or worsening ? ?Answer Assessment - Initial Assessment Questions ?1. ONSET: "When did the swelling start?" (e.g., minutes, hours, days) ?    3 weeks ago  ?2. LOCATION: "What part of the leg is swollen?"  "Are both legs swollen or just one leg?" ?    Feet to knees  ?3. SEVERITY: "How bad is the swelling?" (e.g., localized; mild, moderate, severe) ? - Localized - small area of swelling localized to one leg ? - MILD pedal edema - swelling limited to foot and ankle, pitting edema < 1/4 inch (6 mm) deep, rest and elevation eliminate most or all swelling ? - MODERATE edema - swelling of lower leg to knee, pitting edema > 1/4 inch (6 mm) deep, rest and elevation only partially reduce swelling ? - SEVERE edema - swelling extends above knee, facial or hand swelling present  ?    Reports pitting edema , wears compression stockings  ?4. REDNESS: "Does the swelling look red or infected?" ?    no ?5. PAIN: "Is the swelling painful to touch?" If Yes, ask: "How painful is it?"   (Scale 1-10; mild, moderate or severe) ?     Tender to touch ?6. FEVER: "Do you have a fever?" If Yes, ask: "What is it, how was it measured, and when did it start?"  ?    No  ?7. CAUSE: "What do you think is causing the leg swelling?" ?    Not sure takes lasix 40 mg 2 times a day  ?8. MEDICAL HISTORY: "Do you have a history of heart failure, kidney disease, liver failure, or cancer?" ?    Kidney disease  ?9. RECURRENT SYMPTOM: "Have you had leg swelling before?" If Yes, ask: "When was the last time?" "What happened that time?" ?    Yes took some medicine not sure what kind ?10. OTHER SYMPTOMS: "Do you have any other symptoms?" (e.g., chest pain, difficulty breathing) ?      na ?11. PREGNANCY: "Is there any chance you are pregnant?" "When was your last menstrual period?" ?      na ? ?Protocols used: Leg Swelling and Edema-A-AH ? ?

## 2021-10-09 NOTE — Progress Notes (Signed)
?[place]   ?[phone] [phone]   ?Clinic Follow up Note  ? ?Patient Care Team: ?[name], MD as PCP - General (Internal Medicine) ?Truitt Merle, MD as Consulting Physician (Hematology) ? ?Date of Service:  10/09/2021 ? ?I connected with [name] on 10/09/2021 at  3:00 PM EDT by telephone visit and verified that I am speaking with the correct person using two identifiers.  ?I discussed the limitations, risks, security and privacy concerns of performing an evaluation and management service by telephone and the availability of in person appointments. I also discussed with the patient that there may be a patient responsible charge related to this service. The patient expressed understanding and agreed to proceed.  ? ?Other persons participating in the visit and their role in the encounter:  none ? ?Patient's location:  home ?Provider's location:  my office ? ?CHIEF COMPLAINT: f/u of rectal cancer ? ?CURRENT THERAPY:  ?PENDING Concurrent chemoRT with Xeloda ? ?ASSESSMENT & PLAN:  ?Tina Waller is a 81 y.o. female with  ? ?1. Rectal Cancer, stage I c(T2, N0)M0 ?-presented to ED on 08/10/21 with weakness and black stool. Colonoscopy and EGD on 08/15/21 by Dr. Rush Landmark showed a palpable, partially obstructing rectal mass. Path confirmed fragments of adenocarcinoma with multifocal high-grade dysplasia and focally suspicious for intramucosal carcinoma. ?-CT CAP on 08/16/21 showed: two tiny nonspecific liver lesions; 9 mm hypodense pancreatic body lesion. MRCP on 08/18/21 showed hepatic lesion compatible with simple hepatic cyst, within limitation of beam hardening artifact, and small lesions in right psoas muscle. ?-baseline CEA on 08/16/21 was normal at 3.7. ?-staging pelvis MRI on 08/21/21 showed stage T2 N0 rectal cancer. ?-she met Dr. Marcello Moores on 09/23/21. Pt declines surgery due to concern for side effects/quality of life as she lives alone. ?-she also met Dr. Lisbeth Renshaw  yesterday, 10/08/21. She is hesitant to consider radiation given a prior bad experience her sister (with cervical cancer) had, in which other areas of her body were affected. I discussed with her that this is very unusual. I reassured her that other patients with rectal cancer tolerated radiation well overall. ?-I also discussed concurrent Xeloda with her today. ?--Chemotherapy consent: Side effects including but does not not limited to, fatigue, nausea, vomiting, diarrhea, hair loss, neuropathy, fluid retention, renal and kidney dysfunction, neutropenic fever, needed for blood transfusion, bleeding, were discussed with patient in great detail. She agrees to proceed with concurrent chemoRT. ?-will repeat lab next week when she comes in for simulation, if her GFR is less than 30, I may not be able to give her Xeloda  ?-I will reach out to Dr. Lisbeth Renshaw to get CT simulation arranged. I also called in Xeloda; she knows not to start until she starts radiation. ?  ?2. Weakness and fatigue, recent COVID  ?-she is now unable to walk independently and requires oxygen since hospitalization.  ?-she endorses doing home PT ?  ?3. Anemia ?-secondary to GI blood loss and CKD ?-I previously recommended she take oral iron supplement. ?  ?4. Social Support ?-she previously lived independently and did not require assistance. ?-she is currently in an apartment and has support from some of her neighbors. ?-she has a son in [place], who visits her every other week, but no other nearby friends or relatives. ?  ?  ?PLAN: ?-I will reach out to Dr. Lisbeth Renshaw to schedule CT simulation soon ?-I called in Xeloda and zofran; she knows not to refill until I have repeated lab  next week ?-lab and f/u on first day of chemoRT, likely 4/10  ? ? ?No problem-specific Assessment & Plan notes found for this encounter. ? ? ? ?SUMMARY OF ONCOLOGIC HISTORY: ?Oncology History  ?Rectal cancer ([place])  ?08/15/2021 Cancer Staging  ? Staging form: Colon and Rectum, AJCC 8th  Edition ?- Clinical stage from 08/15/2021: Stage I (cT2, cN0, cM0) - Signed by Truitt Merle, MD on 09/05/2021 ?Stage prefix: Initial diagnosis ?Total positive nodes: 0 ? ?  ?08/15/2021 Procedure  ? Colonoscopy, Dr. Rush Landmark ? ?Impression: ?- Palpable rectal mass found on digital rectal exam. ?- The examined portion of the ileum was normal. ?- Four 3 to 7 mm polyps at the recto-sigmoid colon, in the descending colon and in the ascending colon, removed with a cold snare. Resected and retrieved. ?- Rule out malignancy, partially obstructing tumor in the proximal/middle rectum. Biopsied. Tattooed proximal to lesion. ?- Diverticulosis in the entire examined colon. ?- Normal mucosa in the entire examined colon otherwise. ?- Non-bleeding non-thrombosed external and internal hemorrhoids. ?  ?08/15/2021 Initial Biopsy  ? FINAL MICROSCOPIC DIAGNOSIS:  ? ?A. STOMACH, BIOPSY:  ?- Gastric antral and oxyntic mucosa with nonspecific reactive  ?gastropathy  ?- Helicobacter pylori-like organisms are not identified on routine HE stain  ? ?B. DUODENUM, BIOPSY:  ?- Duodenal mucosa with no specific histopathologic changes  ?- Negative for increased intraepithelial lymphocytes or villous  ?architectural changes  ? ?C. COLON, ASCENDING, DESCENDING AND SIGMOID, POLYPECTOMY:  ?- Tubular adenoma(s)  ?- Negative for high-grade dysplasia or malignancy  ? ?D. RECTAL MASS, BIOPSY:  ?- Fragments of an adenoma with multifocal high-grade dysplasia, focally suspicious for intramucosal carcinoma.  See comment  ? ? ?COMMENT:  ?D.  A deeper, more severe process cannot be ruled out.  Clinical and endoscopic correlation is suggested.  ?  ?08/16/2021 Imaging  ? EXAM: ?CT CHEST, ABDOMEN, AND PELVIS WITH CONTRAST ? ?IMPRESSION: ?1. Large rectal mass. Two tiny hypodense lesions in the liver are ?nonspecific with regard to benign etiology or metastatic disease. ?2. 9 mm in long axis hypodense pancreatic body lesion. Possibilities include postinflammatory cyst,  intraductal papillary mucinous neoplasm, or less likely pancreatic adenocarcinoma. ?3. Bilateral renal cysts. A complex right kidney upper pole lesion ?is indeterminate for complex cyst versus mass and measures 9 mm in diameter. ?4. In light of these findings, consider upper abdominal MRI with ?contrast to further characterize the hepatic, pancreatic, and renal ?lesions. ?5. There is also unusual left perirenal stranding especially ?adjacent to a cyst along the left kidney lower pole, this could be ?an indicator of inflammation involving the left kidney but is ?technically nonspecific. Some of this edema tracks along the left ?paracolic gutter. ?6. Advanced systemic atherosclerosis. This includes aortic and ?coronary atherosclerosis along with mesenteric and renal ?atherosclerosis. If further workup of the patient's severe ?atherosclerotic vascular disease is warranted, CT angiography would be recommended. ?7. Small bilateral pleural effusions with passive atelectasis. ?8. Severe centrilobular emphysema.  Emphysema (ICD10-J43.9). ?9. Mild thoracolumbar scoliosis. ?10. Scattered sigmoid colon diverticula. ?  ?08/18/2021 Imaging  ? EXAM: ?MRI ABDOMEN WITHOUT AND WITH CONTRAST (INCLUDING MRCP) ? ?IMPRESSION: ?1. Study is severely limited by beam hardening artifact from a large ?surgical clip in the stomach, which completely obscures the ?previously noted pancreatic lesion on most pulse sequences rendering today's study nondiagnostic. The only visualized hepatic lesion is compatible with a small simple hepatic cyst. ?2. There is abnormal signal intensity and small rim enhancing ?lesions in the right psoas muscle. Clinical correlation for signs ?  and symptoms of psoas abscess is recommended. Alternatively, the possibility of intramuscular metastasis could be considered. ?3. Small bilateral pleural effusions lying dependently with areas of ?presumed passive atelectasis in the lower lobes of the lungs ?bilaterally. ?4.  Multiple simple cysts in the kidneys bilaterally. ?5. Aortic atherosclerosis. ?  ?08/20/2021 Initial Diagnosis  ? Rectal cancer [facility]) ?  ?08/21/2021 Imaging  ? EXAM: ?MRI PELVIS WITHOUT CONTRAST ? ?IMPRESSION: ?Rectal ade

## 2021-10-09 NOTE — Telephone Encounter (Signed)
Oral Oncology Patient Advocate Encounter ?  ?Received notification from [facility] that prior authorization for Xeloda is required. ?  ?PA submitted on CoverMyMeds ?Key [id number] ?Status is pending ?  ?Oral Oncology Clinic will continue to follow. ? ? ?Wynn Maudlin CPHT ?Specialty Pharmacy Patient Advocate ?[place] ?Phone [phone] ?Fax [phone] ?10/09/2021 3:59 PM ? ?

## 2021-10-10 ENCOUNTER — Other Ambulatory Visit (HOSPITAL_COMMUNITY): Payer: Self-pay

## 2021-10-10 ENCOUNTER — Ambulatory Visit: Payer: Medicare HMO | Admitting: Physician Assistant

## 2021-10-10 NOTE — Telephone Encounter (Signed)
Oral Oncology Patient Advocate Encounter ? ?Prior Authorization for Xeloda has been approved.   ? ?PA# [id number] ?Effective dates: 10/10/21 through 07/13/22 ? ?Patients co-pay is $6 ? ?Oral Oncology Clinic will continue to follow.  ? ?Wynn Maudlin CPHT ?Specialty Pharmacy Patient Advocate ?[place] ?Phone [phone] ?Fax [phone] ?10/10/2021 8:05 AM ? ?

## 2021-10-11 ENCOUNTER — Telehealth: Payer: Self-pay | Admitting: Hematology

## 2021-10-11 NOTE — Telephone Encounter (Signed)
Scheduled follow-up appointment per 3/29 los. Patient is aware. ?

## 2021-10-11 NOTE — Progress Notes (Signed)
I spoke with Tina Waller to let her know a lab appt has been made for her after her appt for CT simulation on 10/15/2021.  I let her know we are arranging transportation and some one will call with the pick up time.  I also let her know we will be scheduling an f/u appt with med/onc and more lab work the first day of her radiation therapy.  All questions were answered.  She verbalized understanding. ? ?

## 2021-10-14 NOTE — Progress Notes (Signed)
I spoke with Tina Waller and let her know that transportation will pick her up tomorrow, 10/15/2021 at 0730 for her radiation appt. ?

## 2021-10-15 ENCOUNTER — Other Ambulatory Visit: Payer: Self-pay

## 2021-10-15 ENCOUNTER — Telehealth: Payer: Self-pay | Admitting: Pharmacist

## 2021-10-15 ENCOUNTER — Inpatient Hospital Stay: Payer: Medicare HMO

## 2021-10-15 ENCOUNTER — Other Ambulatory Visit (HOSPITAL_COMMUNITY): Payer: Self-pay

## 2021-10-15 ENCOUNTER — Ambulatory Visit
Admission: RE | Admit: 2021-10-15 | Discharge: 2021-10-15 | Disposition: A | Discharge status: Home / Self Care | Payer: Medicare HMO | Source: Ambulatory Visit | Attending: Radiation Oncology | Admitting: Radiation Oncology

## 2021-10-15 DIAGNOSIS — C20 Malignant neoplasm of rectum: Secondary | ICD-10-CM

## 2021-10-15 DIAGNOSIS — D631 Anemia in chronic kidney disease: Secondary | ICD-10-CM | POA: Insufficient documentation

## 2021-10-15 DIAGNOSIS — Z79899 Other long term (current) drug therapy: Secondary | ICD-10-CM | POA: Insufficient documentation

## 2021-10-15 DIAGNOSIS — Z8616 Personal history of COVID-19: Secondary | ICD-10-CM | POA: Insufficient documentation

## 2021-10-15 DIAGNOSIS — D500 Iron deficiency anemia secondary to blood loss (chronic): Secondary | ICD-10-CM | POA: Insufficient documentation

## 2021-10-15 DIAGNOSIS — N189 Chronic kidney disease, unspecified: Secondary | ICD-10-CM | POA: Insufficient documentation

## 2021-10-15 DIAGNOSIS — R5383 Other fatigue: Secondary | ICD-10-CM | POA: Insufficient documentation

## 2021-10-15 DIAGNOSIS — Z510 Encounter for antineoplastic radiation therapy: Secondary | ICD-10-CM | POA: Insufficient documentation

## 2021-10-15 DIAGNOSIS — K922 Gastrointestinal hemorrhage, unspecified: Secondary | ICD-10-CM

## 2021-10-15 DIAGNOSIS — R609 Edema, unspecified: Secondary | ICD-10-CM | POA: Insufficient documentation

## 2021-10-15 LAB — CEA (IN HOUSE-CHCC): CEA (CHCC-In House): 2.01 ng/mL (ref 0.00–5.00)

## 2021-10-15 LAB — CMP (CANCER CENTER ONLY)
ALT: 6 U/L (ref 0–44)
AST: 12 U/L — ABNORMAL LOW (ref 15–41)
Albumin: 3.5 g/dL (ref 3.5–5.0)
Alkaline Phosphatase: 83 U/L (ref 38–126)
Anion gap: 8 (ref 5–15)
BUN: 14 mg/dL (ref 8–23)
CO2: 25 mmol/L (ref 22–32)
Calcium: 9.2 mg/dL (ref 8.9–10.3)
Chloride: 108 mmol/L (ref 98–111)
Creatinine: 1.13 mg/dL — ABNORMAL HIGH (ref 0.44–1.00)
GFR, Estimated: 50 mL/min — ABNORMAL LOW (ref >60)
Glucose, Bld: 90 mg/dL (ref 70–99)
Potassium: 3.9 mmol/L (ref 3.5–5.1)
Sodium: 141 mmol/L (ref 135–145)
Total Bilirubin: 0.5 mg/dL (ref 0.3–1.2)
Total Protein: 6.8 g/dL (ref 6.5–8.1)

## 2021-10-15 LAB — CBC WITH DIFFERENTIAL (CANCER CENTER ONLY)
Abs Immature Granulocytes: 0.01 10*3/uL (ref 0.00–0.07)
Basophils Absolute: 0 10*3/uL (ref 0.0–0.1)
Basophils Relative: 0 %
Eosinophils Absolute: 0.2 10*3/uL (ref 0.0–0.5)
Eosinophils Relative: 3 %
HCT: 31.9 % — ABNORMAL LOW (ref 36.0–46.0)
Hemoglobin: 10.4 g/dL — ABNORMAL LOW (ref 12.0–15.0)
Immature Granulocytes: 0 %
Lymphocytes Relative: 40 %
Lymphs Abs: 2.5 10*3/uL (ref 0.7–4.0)
MCH: 31.3 pg (ref 26.0–34.0)
MCHC: 32.6 g/dL (ref 30.0–36.0)
MCV: 96.1 fL (ref 80.0–100.0)
Monocytes Absolute: 0.9 10*3/uL (ref 0.1–1.0)
Monocytes Relative: 14 %
Neutro Abs: 2.6 10*3/uL (ref 1.7–7.7)
Neutrophils Relative %: 43 %
Platelet Count: 257 10*3/uL (ref 150–400)
RBC: 3.32 MIL/uL — ABNORMAL LOW (ref 3.87–5.11)
RDW: 13.1 % (ref 11.5–15.5)
WBC Count: 6.1 10*3/uL (ref 4.0–10.5)
nRBC: 0 % (ref 0.0–0.2)

## 2021-10-15 LAB — IRON AND IRON BINDING CAPACITY (CC-WL,HP ONLY)
Iron: 76 ug/dL (ref 28–170)
Saturation Ratios: 34 % — ABNORMAL HIGH (ref 10.4–31.8)
TIBC: 224 ug/dL — ABNORMAL LOW (ref 250–450)
UIBC: 148 ug/dL (ref 148–442)

## 2021-10-15 LAB — FERRITIN: Ferritin: 131 ng/mL (ref 11–307)

## 2021-10-15 MED ORDER — CAPECITABINE 500 MG PO TABS
1000.0000 mg | ORAL_TABLET | Freq: Two times a day (BID) | ORAL | 1 refills | Status: DC
  Filled 2021-10-15: qty 60, 15d supply, fill #0
  Filled 2021-10-16: qty 60, 21d supply, fill #0
  Filled 2021-11-01: qty 60, 21d supply, fill #1

## 2021-10-15 NOTE — Telephone Encounter (Signed)
Oral Chemotherapy Pharmacist Encounter  ? ?Attempted to reach patient to provide update and offer for initial counseling on oral medication: Xeloda (capecitabine).  ? ?No answer. Left voicemail for patient to call back to discuss details of medication acquisition and initial counseling session. ? ?Leron Croak, PharmD, BCPS ?Hematology/Oncology Clinical Pharmacist ?[facility] Oral Chemotherapy Navigation Clinics ?[phone] ?10/15/2021 12:03 PM ? ? ? ?

## 2021-10-15 NOTE — Telephone Encounter (Signed)
Oral Oncology Pharmacist Encounter ? ?Received new prescription for Xeloda (capecitabine) for the treatment of rectal cancer in conjunction with radiation, planned duration 5-1/2 weeks. ? ?CBC w/ Diff and CMP from 10/15/21 assessed, Scr improved to 1.13 mg/dL (CrCl ~37.6 mL/min). Dose has been reduced 25% to account for renal dysfunction. Prescription dose and frequency assessed for appropriateness.  ? ?Current medication list in Epic reviewed, DDIs with Xeloda identified: ?Category C DDI between Xeloda and Pantoprazole - proton-pump inhibitors can decrease efficacy of Xeloda - will discuss with patient alternatives to pantoprazole, such as H2RA's like famotidine while on Xeloda. ?Category C DDI between Xeloda and Ondansetron due to risk of Qtc prolongation with fluorouracil products. Noted patient only taking PRN and PO route, risk higher with IV administration. No change in therapy warranted at this time.  ? ?Evaluated chart and no patient barriers to medication adherence noted.  ? ?Patient agreement for treatment documented in MD note on 10/09/21. ? ?Prescription has been e-scribed to the [facility] for benefits analysis and approval. ? ?Oral Oncology Clinic will continue to follow for insurance authorization, copayment issues, initial counseling and start date. ? ?Leron Croak, PharmD, BCPS ?Hematology/Oncology Clinical Pharmacist ?[facility] Oral Chemotherapy Navigation Clinics ?[phone] ?10/15/2021 9:49 AM ? ?

## 2021-10-16 ENCOUNTER — Other Ambulatory Visit (HOSPITAL_COMMUNITY): Payer: Self-pay

## 2021-10-16 NOTE — Telephone Encounter (Signed)
Oral Chemotherapy Pharmacist Encounter  ? ?2nd attempt made to reach patient to provide update and offer for initial counseling on oral medication: Xeloda (capecitabine).  ? ?Unable to leave voicemail. Will try calling back patient later this afternoon.  ? ?Leron Croak, PharmD, BCPS ?Hematology/Oncology Clinical Pharmacist ?[facility] Oral Chemotherapy Navigation Clinics ?[phone] ?10/16/2021 9:26 AM ? ? ?

## 2021-10-16 NOTE — Telephone Encounter (Signed)
Oral Chemotherapy Pharmacist Encounter ? ?I spoke with patient for overview of: Xeloda for the treatment of rectal cancer in conjunction with radiation, planned duration 5-1/2 weeks. ? ?Counseled patient on administration, dosing, side effects, monitoring, drug-food interactions, safe handling, storage, and disposal. ? ?Patient will take Xeloda '500mg'$  tablets, 2 tablets ('1000mg'$ ) by mouth in AM and 2 tabs ('1000mg'$ ) by mouth in PM, within 30 minutes of finishing meals, on days of radiation only. ? ?Xeloda and radiation start date: 10/21/21 ? ?Adverse effects of Xeloda include but are not limited to: fatigue, decreased blood counts, GI upset, diarrhea, mouth sores, and hand-foot syndrome. ? ?Patient has anti-emetic on hand and knows to take it if nausea develops.   ?Patient will obtain anti diarrheal and alert the office of 4 or more loose stools above baseline.  ? ?Reviewed with patient importance of keeping a medication schedule and plan for any missed doses. No barriers to medication adherence identified. ? ?Medication reconciliation performed and medication/allergy list updated. Discussed drug-drug interaction between Tina Waller pantoprazole with the Xeloda and that pantoprazole may decrease the efficacy of Xeloda. Patient will hold the pantoprazole while she is on Xeloda. She expressed understanding.   ? ?Insurance authorization for Xeloda has been obtained. ?Test claim at the pharmacy revealed copayment $6 for 1st fill of Xeloda. ?This will ship from the [place] on 10/16/21 to deliver to patient's home on 10/17/21. ? ?Patient informed the pharmacy will reach out 5-7 days prior to needing next fill of Xeloda to coordinate continued medication acquisition to prevent break in therapy. ? ?All questions answered. ? ?Tina Waller voiced understanding and appreciation.  ? ?Medication education handout and medication calendar placed in mail for patient. Patient knows to call the office with questions  or concerns. Oral Chemotherapy Clinic phone number provided to patient.  ? ?Tina Waller, PharmD, BCPS ?Hematology/Oncology Clinical Pharmacist ?[facility] Oral Chemotherapy Navigation Clinics ?[phone] ?10/16/2021 11:16 AM ? ?

## 2021-10-17 ENCOUNTER — Ambulatory Visit
Admission: RE | Admit: 2021-10-17 | Discharge: 2021-10-17 | Disposition: A | Discharge status: Home / Self Care | Payer: Medicare HMO | Source: Ambulatory Visit | Attending: Internal Medicine | Admitting: Internal Medicine

## 2021-10-17 DIAGNOSIS — N189 Chronic kidney disease, unspecified: Secondary | ICD-10-CM | POA: Diagnosis not present

## 2021-10-17 DIAGNOSIS — R5383 Other fatigue: Secondary | ICD-10-CM | POA: Diagnosis not present

## 2021-10-17 DIAGNOSIS — Z79899 Other long term (current) drug therapy: Secondary | ICD-10-CM | POA: Diagnosis not present

## 2021-10-17 DIAGNOSIS — Z510 Encounter for antineoplastic radiation therapy: Secondary | ICD-10-CM | POA: Diagnosis not present

## 2021-10-17 DIAGNOSIS — R609 Edema, unspecified: Secondary | ICD-10-CM | POA: Diagnosis not present

## 2021-10-17 DIAGNOSIS — Z8616 Personal history of COVID-19: Secondary | ICD-10-CM | POA: Diagnosis not present

## 2021-10-17 DIAGNOSIS — C20 Malignant neoplasm of rectum: Secondary | ICD-10-CM | POA: Diagnosis not present

## 2021-10-17 DIAGNOSIS — D500 Iron deficiency anemia secondary to blood loss (chronic): Secondary | ICD-10-CM | POA: Diagnosis not present

## 2021-10-17 DIAGNOSIS — R6889 Other general symptoms and signs: Secondary | ICD-10-CM | POA: Diagnosis not present

## 2021-10-17 DIAGNOSIS — M810 Age-related osteoporosis without current pathological fracture: Secondary | ICD-10-CM

## 2021-10-17 DIAGNOSIS — D631 Anemia in chronic kidney disease: Secondary | ICD-10-CM | POA: Diagnosis not present

## 2021-10-21 ENCOUNTER — Other Ambulatory Visit: Payer: Self-pay

## 2021-10-21 ENCOUNTER — Inpatient Hospital Stay: Payer: Medicare HMO | Admitting: Physician Assistant

## 2021-10-21 ENCOUNTER — Inpatient Hospital Stay: Payer: Medicare HMO

## 2021-10-21 ENCOUNTER — Ambulatory Visit
Admission: RE | Admit: 2021-10-21 | Discharge: 2021-10-21 | Disposition: A | Discharge status: Home / Self Care | Payer: Medicare HMO | Source: Ambulatory Visit | Attending: Radiation Oncology | Admitting: Radiation Oncology

## 2021-10-21 VITALS — BP 167/72 | HR 80 | Temp 97.9°F | Resp 18 | Wt 125.1 lb

## 2021-10-21 DIAGNOSIS — D500 Iron deficiency anemia secondary to blood loss (chronic): Secondary | ICD-10-CM | POA: Diagnosis not present

## 2021-10-21 DIAGNOSIS — R5383 Other fatigue: Secondary | ICD-10-CM | POA: Diagnosis not present

## 2021-10-21 DIAGNOSIS — Z79899 Other long term (current) drug therapy: Secondary | ICD-10-CM | POA: Diagnosis not present

## 2021-10-21 DIAGNOSIS — K922 Gastrointestinal hemorrhage, unspecified: Secondary | ICD-10-CM

## 2021-10-21 DIAGNOSIS — Z510 Encounter for antineoplastic radiation therapy: Secondary | ICD-10-CM | POA: Diagnosis not present

## 2021-10-21 DIAGNOSIS — D631 Anemia in chronic kidney disease: Secondary | ICD-10-CM | POA: Diagnosis not present

## 2021-10-21 DIAGNOSIS — N189 Chronic kidney disease, unspecified: Secondary | ICD-10-CM | POA: Diagnosis not present

## 2021-10-21 DIAGNOSIS — C20 Malignant neoplasm of rectum: Secondary | ICD-10-CM

## 2021-10-21 DIAGNOSIS — R609 Edema, unspecified: Secondary | ICD-10-CM | POA: Diagnosis not present

## 2021-10-21 DIAGNOSIS — Z8616 Personal history of COVID-19: Secondary | ICD-10-CM | POA: Diagnosis not present

## 2021-10-21 LAB — CMP (CANCER CENTER ONLY)
ALT: 7 U/L (ref 0–44)
AST: 12 U/L — ABNORMAL LOW (ref 15–41)
Albumin: 3.3 g/dL — ABNORMAL LOW (ref 3.5–5.0)
Alkaline Phosphatase: 86 U/L (ref 38–126)
Anion gap: 7 (ref 5–15)
BUN: 14 mg/dL (ref 8–23)
CO2: 23 mmol/L (ref 22–32)
Calcium: 8.9 mg/dL (ref 8.9–10.3)
Chloride: 111 mmol/L (ref 98–111)
Creatinine: 1.16 mg/dL — ABNORMAL HIGH (ref 0.44–1.00)
GFR, Estimated: 48 mL/min — ABNORMAL LOW (ref >60)
Glucose, Bld: 118 mg/dL — ABNORMAL HIGH (ref 70–99)
Potassium: 3.9 mmol/L (ref 3.5–5.1)
Sodium: 141 mmol/L (ref 135–145)
Total Bilirubin: 0.4 mg/dL (ref 0.3–1.2)
Total Protein: 6.5 g/dL (ref 6.5–8.1)

## 2021-10-21 LAB — CBC WITH DIFFERENTIAL (CANCER CENTER ONLY)
Abs Immature Granulocytes: 0.02 10*3/uL (ref 0.00–0.07)
Basophils Absolute: 0 10*3/uL (ref 0.0–0.1)
Basophils Relative: 1 %
Eosinophils Absolute: 0.2 10*3/uL (ref 0.0–0.5)
Eosinophils Relative: 3 %
HCT: 32.7 % — ABNORMAL LOW (ref 36.0–46.0)
Hemoglobin: 10.4 g/dL — ABNORMAL LOW (ref 12.0–15.0)
Immature Granulocytes: 0 %
Lymphocytes Relative: 33 %
Lymphs Abs: 2 10*3/uL (ref 0.7–4.0)
MCH: 31.2 pg (ref 26.0–34.0)
MCHC: 31.8 g/dL (ref 30.0–36.0)
MCV: 98.2 fL (ref 80.0–100.0)
Monocytes Absolute: 0.6 10*3/uL (ref 0.1–1.0)
Monocytes Relative: 9 %
Neutro Abs: 3.3 10*3/uL (ref 1.7–7.7)
Neutrophils Relative %: 54 %
Platelet Count: 323 10*3/uL (ref 150–400)
RBC: 3.33 MIL/uL — ABNORMAL LOW (ref 3.87–5.11)
RDW: 13.8 % (ref 11.5–15.5)
WBC Count: 6.1 10*3/uL (ref 4.0–10.5)
nRBC: 0 % (ref 0.0–0.2)

## 2021-10-21 LAB — IRON AND IRON BINDING CAPACITY (CC-WL,HP ONLY)
Iron: 104 ug/dL (ref 28–170)
Saturation Ratios: 48 % — ABNORMAL HIGH (ref 10.4–31.8)
TIBC: 217 ug/dL — ABNORMAL LOW (ref 250–450)
UIBC: 113 ug/dL — ABNORMAL LOW (ref 148–442)

## 2021-10-21 LAB — FERRITIN: Ferritin: 97 ng/mL (ref 11–307)

## 2021-10-21 NOTE — Progress Notes (Signed)
?[facility]   ?[phone] [phone]   ?Clinic Follow up Note  ? ?Patient Care Team: ?[name], MD as PCP - General (Internal Medicine) ?Truitt Merle, MD as Consulting Physician (Hematology) ? ?Date of Service:  10/21/2021 ? ?CHIEF COMPLAINT: f/u of rectal cancer ? ?CURRENT THERAPY:  ?Concurrent chemoRT with Xeloda, started today ? ? ?SUMMARY OF ONCOLOGIC HISTORY: ?Oncology History  ?Rectal cancer ([place])  ?08/15/2021 Cancer Staging  ? Staging form: Colon and Rectum, AJCC 8th Edition ?- Clinical stage from 08/15/2021: Stage I (cT2, cN0, cM0) - Signed by Truitt Merle, MD on 09/05/2021 ?Stage prefix: Initial diagnosis ?Total positive nodes: 0 ? ?  ?08/15/2021 Procedure  ? Colonoscopy, Dr. Rush Landmark ? ?Impression: ?- Palpable rectal mass found on digital rectal exam. ?- The examined portion of the ileum was normal. ?- Four 3 to 7 mm polyps at the recto-sigmoid colon, in the descending colon and in the ascending colon, removed with a cold snare. Resected and retrieved. ?- Rule out malignancy, partially obstructing tumor in the proximal/middle rectum. Biopsied. Tattooed proximal to lesion. ?- Diverticulosis in the entire examined colon. ?- Normal mucosa in the entire examined colon otherwise. ?- Non-bleeding non-thrombosed external and internal hemorrhoids. ?  ?08/15/2021 Initial Biopsy  ? FINAL MICROSCOPIC DIAGNOSIS:  ? ?A. STOMACH, BIOPSY:  ?- Gastric antral and oxyntic mucosa with nonspecific reactive  ?gastropathy  ?- Helicobacter pylori-like organisms are not identified on routine HE stain  ? ?B. DUODENUM, BIOPSY:  ?- Duodenal mucosa with no specific histopathologic changes  ?- Negative for increased intraepithelial lymphocytes or villous  ?architectural changes  ? ?C. COLON, ASCENDING, DESCENDING AND SIGMOID, POLYPECTOMY:  ?- Tubular adenoma(s)  ?- Negative for high-grade dysplasia or malignancy  ? ?D. RECTAL MASS, BIOPSY:  ?- Fragments of an adenoma with multifocal high-grade  dysplasia, focally suspicious for intramucosal carcinoma.  See comment  ? ? ?COMMENT:  ?D.  A deeper, more severe process cannot be ruled out.  Clinical and endoscopic correlation is suggested.  ?  ?08/16/2021 Imaging  ? EXAM: ?CT CHEST, ABDOMEN, AND PELVIS WITH CONTRAST ? ?IMPRESSION: ?1. Large rectal mass. Two tiny hypodense lesions in the liver are ?nonspecific with regard to benign etiology or metastatic disease. ?2. 9 mm in long axis hypodense pancreatic body lesion. Possibilities include postinflammatory cyst, intraductal papillary mucinous neoplasm, or less likely pancreatic adenocarcinoma. ?3. Bilateral renal cysts. A complex right kidney upper pole lesion ?is indeterminate for complex cyst versus mass and measures 9 mm in diameter. ?4. In light of these findings, consider upper abdominal MRI with ?contrast to further characterize the hepatic, pancreatic, and renal ?lesions. ?5. There is also unusual left perirenal stranding especially ?adjacent to a cyst along the left kidney lower pole, this could be ?an indicator of inflammation involving the left kidney but is ?technically nonspecific. Some of this edema tracks along the left ?paracolic gutter. ?6. Advanced systemic atherosclerosis. This includes aortic and ?coronary atherosclerosis along with mesenteric and renal ?atherosclerosis. If further workup of the patient's severe ?atherosclerotic vascular disease is warranted, CT angiography would be recommended. ?7. Small bilateral pleural effusions with passive atelectasis. ?8. Severe centrilobular emphysema.  Emphysema (ICD10-J43.9). ?9. Mild thoracolumbar scoliosis. ?10. Scattered sigmoid colon diverticula. ?  ?08/18/2021 Imaging  ? EXAM: ?MRI ABDOMEN WITHOUT AND WITH CONTRAST (INCLUDING MRCP) ? ?IMPRESSION: ?1. Study is severely limited by beam hardening artifact from a large ?surgical clip in the stomach, which completely obscures the ?previously noted pancreatic lesion on most pulse sequences rendering  today's  study nondiagnostic. The only visualized hepatic lesion is compatible with a small simple hepatic cyst. ?2. There is abnormal signal intensity and small rim enhancing ?lesions in the right psoas muscle. Clinical correlation for signs ?and symptoms of psoas abscess is recommended. Alternatively, the possibility of intramuscular metastasis could be considered. ?3. Small bilateral pleural effusions lying dependently with areas of ?presumed passive atelectasis in the lower lobes of the lungs ?bilaterally. ?4. Multiple simple cysts in the kidneys bilaterally. ?5. Aortic atherosclerosis. ?  ?08/20/2021 Initial Diagnosis  ? Rectal cancer [facility]) ?  ?08/21/2021 Imaging  ? EXAM: ?MRI PELVIS WITHOUT CONTRAST ? ?IMPRESSION: ?Rectal adenocarcinoma T stage: T2 ?  ?Rectal adenocarcinoma N stage:  N0 ?  ?Distance from tumor to the internal anal sphincter is 4.5 cm. ?  ? ? ? ?INTERVAL HISTORY:  ?Tina Waller returns for a follow up for rectal cancer. She starts concurrent radiation with Xeloda today. She took her first dose of Xeloda this morning without any issues.  ? ?She reports her energy levels are stable. She continues with physical therapy once a week. She uses a walker to help with ambulation. She has a good appetite and denies any weight loss over the past 4-6 weeks. She denies nausea, vomiting or abdominal pain. She continues to have loose stools with rectal bleeding and passing mucous. She denies any rectal pain. She continues to have bilateral lower extremity edema that has been persistent over the last several weeks. She is compliant with wearing compression stockings and takes furosemix as prescribed. She denies fevers, chills, night sweats, shortness of breath, chest pain, cough, oral mucositis, rash or peeling of skin. She has no other complaints. ?  ?All other systems were reviewed with the patient and are negative. ? ?MEDICAL HISTORY:  ?Past Medical History:  ?Diagnosis Date  ? Anemia   ? Anxiety   ?  Cataract   ? Depression   ? Diabetes mellitus without complication ([place])   ? pt denies  ? GERD (gastroesophageal reflux disease)   ? Gout   ? Headache   ? Hypertension   ? Stroke [facility]) 2008  ? 09/14/2018  ? Tonsillar mass   ? Type 2 diabetes mellitus ([place]) 09/14/2018  ? Vertigo   ? Wears dentures   ? upper  ? Wears glasses   ? ? ?SURGICAL HISTORY: ?Past Surgical History:  ?Procedure Laterality Date  ? ABDOMINAL HYSTERECTOMY    ? BIOPSY  08/15/2021  ? Procedure: BIOPSY;  Surgeon: Irving Copas., MD;  Location: [facility];  Service: Gastroenterology;;  ? BREAST SURGERY    ? lumpectomy  ? BUNIONECTOMY    ? CATARACT EXTRACTION Left   ? COLONOSCOPY W/ BIOPSIES AND POLYPECTOMY    ? COLONOSCOPY WITH PROPOFOL N/A 08/15/2021  ? Procedure: COLONOSCOPY WITH PROPOFOL;  Surgeon: Mansouraty, Telford Nab., MD;  Location: [facility];  Service: Gastroenterology;  Laterality: N/A;  ? ESOPHAGOGASTRODUODENOSCOPY (EGD) WITH PROPOFOL N/A 08/15/2021  ? Procedure: ESOPHAGOGASTRODUODENOSCOPY (EGD) WITH PROPOFOL;  Surgeon: Rush Landmark Telford Nab., MD;  Location: [facility];  Service: Gastroenterology;  Laterality: N/A;  ? HEMOSTASIS CLIP PLACEMENT  08/15/2021  ? Procedure: HEMOSTASIS CLIP PLACEMENT;  Surgeon: Irving Copas., MD;  Location: [facility];  Service: Gastroenterology;;  ? HOT HEMOSTASIS N/A 08/15/2021  ? Procedure: HOT HEMOSTASIS (ARGON PLASMA COAGULATION/BICAP);  Surgeon: Irving Copas., MD;  Location: [facility];  Service: Gastroenterology;  Laterality: N/A;  ? MULTIPLE TOOTH EXTRACTIONS    ? POLYPECTOMY  08/15/2021  ? Procedure: POLYPECTOMY;  Surgeon: Mansouraty,  Telford Nab., MD;  Location: [facility] ENDOSCOPY;  Service: Gastroenterology;;  ? SUBMUCOSAL TATTOO INJECTION  08/15/2021  ? Procedure: SUBMUCOSAL TATTOO INJECTION;  Surgeon: Irving Copas., MD;  Location: [facility];  Service: Gastroenterology;;  ? TONGUE BIOPSY Right 12/13/2018  ? Procedure: RIGHT TONSIL BIOPSY;  Surgeon: Melida Quitter, MD;   [facility] OR;  Service: ENT;  Laterality: Right;  ? ? ?I have reviewed the social history and family history with the patient and they are unchanged from previous note. ? ?ALLERGIES:  is allergic to lisinopril, carvedilo

## 2021-10-22 ENCOUNTER — Ambulatory Visit
Admission: RE | Admit: 2021-10-22 | Discharge: 2021-10-22 | Disposition: A | Discharge status: Home / Self Care | Payer: Medicare HMO | Source: Ambulatory Visit | Attending: Radiation Oncology | Admitting: Radiation Oncology

## 2021-10-22 DIAGNOSIS — D500 Iron deficiency anemia secondary to blood loss (chronic): Secondary | ICD-10-CM | POA: Diagnosis not present

## 2021-10-22 DIAGNOSIS — Z79899 Other long term (current) drug therapy: Secondary | ICD-10-CM | POA: Diagnosis not present

## 2021-10-22 DIAGNOSIS — Z510 Encounter for antineoplastic radiation therapy: Secondary | ICD-10-CM | POA: Diagnosis not present

## 2021-10-22 DIAGNOSIS — D631 Anemia in chronic kidney disease: Secondary | ICD-10-CM | POA: Diagnosis not present

## 2021-10-22 DIAGNOSIS — Z8616 Personal history of COVID-19: Secondary | ICD-10-CM | POA: Diagnosis not present

## 2021-10-22 DIAGNOSIS — C20 Malignant neoplasm of rectum: Secondary | ICD-10-CM | POA: Diagnosis not present

## 2021-10-22 DIAGNOSIS — N189 Chronic kidney disease, unspecified: Secondary | ICD-10-CM | POA: Diagnosis not present

## 2021-10-22 DIAGNOSIS — R5383 Other fatigue: Secondary | ICD-10-CM | POA: Diagnosis not present

## 2021-10-22 DIAGNOSIS — R609 Edema, unspecified: Secondary | ICD-10-CM | POA: Diagnosis not present

## 2021-10-22 NOTE — Progress Notes (Signed)
Pt here for patient teaching. Pt given Radiation and You booklet, skin care instructions, and Sonafine. Reviewed areas of pertinence such as diarrhea, fatigue, hair loss, sexual and fertility changes, skin changes, and urinary and bladder changes. Pt able to give teach back of to pat skin, use unscented/gentle soap, use baby wipes, have Imodium on hand, drink plenty of water, and sitz bath, apply Sonafine bid and avoid applying anything to skin within 4 hours of treatment. Pt verbalizes understanding of information given and will contact nursing with any questions or concerns.   ?

## 2021-10-23 ENCOUNTER — Ambulatory Visit
Admission: RE | Admit: 2021-10-23 | Discharge: 2021-10-23 | Disposition: A | Discharge status: Home / Self Care | Payer: Medicare HMO | Source: Ambulatory Visit | Attending: Radiation Oncology | Admitting: Radiation Oncology

## 2021-10-23 ENCOUNTER — Other Ambulatory Visit: Payer: Self-pay

## 2021-10-23 DIAGNOSIS — D500 Iron deficiency anemia secondary to blood loss (chronic): Secondary | ICD-10-CM | POA: Diagnosis not present

## 2021-10-23 DIAGNOSIS — D631 Anemia in chronic kidney disease: Secondary | ICD-10-CM | POA: Diagnosis not present

## 2021-10-23 DIAGNOSIS — Z510 Encounter for antineoplastic radiation therapy: Secondary | ICD-10-CM | POA: Diagnosis not present

## 2021-10-23 DIAGNOSIS — N189 Chronic kidney disease, unspecified: Secondary | ICD-10-CM | POA: Diagnosis not present

## 2021-10-23 DIAGNOSIS — R5383 Other fatigue: Secondary | ICD-10-CM | POA: Diagnosis not present

## 2021-10-23 DIAGNOSIS — Z79899 Other long term (current) drug therapy: Secondary | ICD-10-CM | POA: Diagnosis not present

## 2021-10-23 DIAGNOSIS — R609 Edema, unspecified: Secondary | ICD-10-CM | POA: Diagnosis not present

## 2021-10-23 DIAGNOSIS — C20 Malignant neoplasm of rectum: Secondary | ICD-10-CM | POA: Diagnosis not present

## 2021-10-23 DIAGNOSIS — Z8616 Personal history of COVID-19: Secondary | ICD-10-CM | POA: Diagnosis not present

## 2021-10-24 ENCOUNTER — Ambulatory Visit
Admission: RE | Admit: 2021-10-24 | Discharge: 2021-10-24 | Disposition: A | Discharge status: Home / Self Care | Payer: Medicare HMO | Source: Ambulatory Visit | Attending: Radiation Oncology | Admitting: Radiation Oncology

## 2021-10-24 ENCOUNTER — Telehealth: Payer: Self-pay | Admitting: Physician Assistant

## 2021-10-24 DIAGNOSIS — Z510 Encounter for antineoplastic radiation therapy: Secondary | ICD-10-CM | POA: Diagnosis not present

## 2021-10-24 DIAGNOSIS — C20 Malignant neoplasm of rectum: Secondary | ICD-10-CM | POA: Diagnosis not present

## 2021-10-24 DIAGNOSIS — D631 Anemia in chronic kidney disease: Secondary | ICD-10-CM | POA: Diagnosis not present

## 2021-10-24 DIAGNOSIS — R609 Edema, unspecified: Secondary | ICD-10-CM | POA: Diagnosis not present

## 2021-10-24 DIAGNOSIS — N189 Chronic kidney disease, unspecified: Secondary | ICD-10-CM | POA: Diagnosis not present

## 2021-10-24 DIAGNOSIS — Z8616 Personal history of COVID-19: Secondary | ICD-10-CM | POA: Diagnosis not present

## 2021-10-24 DIAGNOSIS — D500 Iron deficiency anemia secondary to blood loss (chronic): Secondary | ICD-10-CM | POA: Diagnosis not present

## 2021-10-24 DIAGNOSIS — R5383 Other fatigue: Secondary | ICD-10-CM | POA: Diagnosis not present

## 2021-10-24 DIAGNOSIS — Z79899 Other long term (current) drug therapy: Secondary | ICD-10-CM | POA: Diagnosis not present

## 2021-10-24 NOTE — Telephone Encounter (Signed)
Scheduled follow-up appointments per 4/10 los. Patient is aware. ?

## 2021-10-25 ENCOUNTER — Ambulatory Visit
Admission: RE | Admit: 2021-10-25 | Discharge: 2021-10-25 | Disposition: A | Discharge status: Home / Self Care | Payer: Medicare HMO | Source: Ambulatory Visit | Attending: Radiation Oncology | Admitting: Radiation Oncology

## 2021-10-25 ENCOUNTER — Other Ambulatory Visit: Payer: Self-pay

## 2021-10-25 DIAGNOSIS — D500 Iron deficiency anemia secondary to blood loss (chronic): Secondary | ICD-10-CM | POA: Diagnosis not present

## 2021-10-25 DIAGNOSIS — D631 Anemia in chronic kidney disease: Secondary | ICD-10-CM | POA: Diagnosis not present

## 2021-10-25 DIAGNOSIS — C20 Malignant neoplasm of rectum: Secondary | ICD-10-CM | POA: Diagnosis not present

## 2021-10-25 DIAGNOSIS — Z510 Encounter for antineoplastic radiation therapy: Secondary | ICD-10-CM | POA: Diagnosis not present

## 2021-10-25 DIAGNOSIS — N189 Chronic kidney disease, unspecified: Secondary | ICD-10-CM | POA: Diagnosis not present

## 2021-10-25 DIAGNOSIS — R609 Edema, unspecified: Secondary | ICD-10-CM | POA: Diagnosis not present

## 2021-10-25 DIAGNOSIS — Z79899 Other long term (current) drug therapy: Secondary | ICD-10-CM | POA: Diagnosis not present

## 2021-10-25 DIAGNOSIS — Z8616 Personal history of COVID-19: Secondary | ICD-10-CM | POA: Diagnosis not present

## 2021-10-25 DIAGNOSIS — R5383 Other fatigue: Secondary | ICD-10-CM | POA: Diagnosis not present

## 2021-10-25 MED ORDER — SONAFINE EX EMUL
1.0000 "application " | Freq: Once | CUTANEOUS | Status: AC
  Administered 2021-10-25: 1 via TOPICAL

## 2021-10-27 NOTE — Progress Notes (Signed)
[facility] ?OFFICE PROGRESS NOTE ? ?[name], MD ?[address] ?[place] [address] ? ?DIAGNOSIS:  f/u of rectal cancer ? ?Oncology History  ?Rectal cancer ([place])  ?08/15/2021 Cancer Staging  ? Staging form: Colon and Rectum, AJCC 8th Edition ?- Clinical stage from 08/15/2021: Stage I (cT2, cN0, cM0) - Signed by Truitt Merle, MD on 09/05/2021 ?Stage prefix: Initial diagnosis ?Total positive nodes: 0 ? ?  ?08/15/2021 Procedure  ? Colonoscopy, Dr. Rush Landmark ? ?Impression: ?- Palpable rectal mass found on digital rectal exam. ?- The examined portion of the ileum was normal. ?- Four 3 to 7 mm polyps at the recto-sigmoid colon, in the descending colon and in the ascending colon, removed with a cold snare. Resected and retrieved. ?- Rule out malignancy, partially obstructing tumor in the proximal/middle rectum. Biopsied. Tattooed proximal to lesion. ?- Diverticulosis in the entire examined colon. ?- Normal mucosa in the entire examined colon otherwise. ?- Non-bleeding non-thrombosed external and internal hemorrhoids. ?  ?08/15/2021 Initial Biopsy  ? FINAL MICROSCOPIC DIAGNOSIS:  ? ?A. STOMACH, BIOPSY:  ?- Gastric antral and oxyntic mucosa with nonspecific reactive  ?gastropathy  ?- Helicobacter pylori-like organisms are not identified on routine HE stain  ? ?B. DUODENUM, BIOPSY:  ?- Duodenal mucosa with no specific histopathologic changes  ?- Negative for increased intraepithelial lymphocytes or villous  ?architectural changes  ? ?C. COLON, ASCENDING, DESCENDING AND SIGMOID, POLYPECTOMY:  ?- Tubular adenoma(s)  ?- Negative for high-grade dysplasia or malignancy  ? ?D. RECTAL MASS, BIOPSY:  ?- Fragments of an adenoma with multifocal high-grade dysplasia, focally suspicious for intramucosal carcinoma.  See comment  ? ? ?COMMENT:  ?D.  A deeper, more severe process cannot be ruled out.  Clinical and endoscopic correlation is suggested.  ?  ?08/16/2021 Imaging  ? EXAM: ?CT CHEST, ABDOMEN, AND PELVIS  WITH CONTRAST ? ?IMPRESSION: ?1. Large rectal mass. Two tiny hypodense lesions in the liver are ?nonspecific with regard to benign etiology or metastatic disease. ?2. 9 mm in long axis hypodense pancreatic body lesion. Possibilities include postinflammatory cyst, intraductal papillary mucinous neoplasm, or less likely pancreatic adenocarcinoma. ?3. Bilateral renal cysts. A complex right kidney upper pole lesion ?is indeterminate for complex cyst versus mass and measures 9 mm in diameter. ?4. In light of these findings, consider upper abdominal MRI with ?contrast to further characterize the hepatic, pancreatic, and renal ?lesions. ?5. There is also unusual left perirenal stranding especially ?adjacent to a cyst along the left kidney lower pole, this could be ?an indicator of inflammation involving the left kidney but is ?technically nonspecific. Some of this edema tracks along the left ?paracolic gutter. ?6. Advanced systemic atherosclerosis. This includes aortic and ?coronary atherosclerosis along with mesenteric and renal ?atherosclerosis. If further workup of the patient's severe ?atherosclerotic vascular disease is warranted, CT angiography would be recommended. ?7. Small bilateral pleural effusions with passive atelectasis. ?8. Severe centrilobular emphysema.  Emphysema (ICD10-J43.9). ?9. Mild thoracolumbar scoliosis. ?10. Scattered sigmoid colon diverticula. ?  ?08/18/2021 Imaging  ? EXAM: ?MRI ABDOMEN WITHOUT AND WITH CONTRAST (INCLUDING MRCP) ? ?IMPRESSION: ?1. Study is severely limited by beam hardening artifact from a large ?surgical clip in the stomach, which completely obscures the ?previously noted pancreatic lesion on most pulse sequences rendering today's study nondiagnostic. The only visualized hepatic lesion is compatible with a small simple hepatic cyst. ?2. There is abnormal signal intensity and small rim enhancing ?lesions in the right psoas muscle. Clinical correlation for signs ?and symptoms of  psoas abscess is recommended. Alternatively, the possibility of intramuscular metastasis could be considered. ?3. Small bilateral pleural effusions lying dependently with areas of ?presumed passive atelectasis in the lower lobes of the lungs ?bilaterally. ?4. Multiple simple cysts in the kidneys bilaterally. ?5. Aortic atherosclerosis. ?  ?08/20/2021 Initial Diagnosis  ? Rectal cancer ([place]) ? ?  ?08/21/2021 Imaging  ? EXAM: ?MRI PELVIS WITHOUT CONTRAST ? ?IMPRESSION: ?Rectal adenocarcinoma T stage: T2 ?  ?Rectal adenocarcinoma N stage:  N0 ?  ?Distance from tumor to the internal anal sphincter is 4.5 cm. ?  ? ? ? ?CURRENT THERAPY: Concurrent chemoRT with Xeloda, started on 10/21/21.  ? ?INTERVAL HISTORY: ?Tina Waller 81 y.o. female returns to the clinic today for a follow up visit. The patient recently started treatment with concurrent chemoRT with Xeloda. Her first dose was last week on 10/21/21. She has been on this for 1 week and is here for a toxicity check. She is tolerating her first cycle of treatment well without any concerning adverse side effects that she has noticed. She does report some occasional diarrhea about 2x per day. She has not taken imodium because she was not sure if she was allowed to. She denies any recent fevers, chills, night sweats, or weight loss. Her appetite is "good". She denies cough, shortness of breath, or chest pain. She denies nausea, vomiting,  or constipation. She denies rectal pain, or itching. She sometimes has bleeding on the toilet paper with a bowel movement since December 2022. She reports it is a little bit of blood. She is compliant with her iron supplement. Denies changes with her bladder habits. She denies abdominal pain. She denies rashes, erythema, or skin peeling. She denies mucositis. She is still doing physical therapy once a week. She takes Norvasc and metoprolol for her BP. She states she took this today as prescribed. She has the capabilities to check her BP  at home and has a BP cuff. She denies headaches, speech changes, or extremity weakness. She is here for evaluation and repeat blood work.  ? ? ? ? ? ? ? ?MEDICAL HISTORY: ?Past Medical History:  ?Diagnosis Date  ? Anemia   ? Anxiety   ? Cataract   ? Depression   ? Diabetes mellitus without complication ([place])   ? pt denies  ? GERD (gastroesophageal reflux disease)   ? Gout   ? Headache   ? Hypertension   ? Stroke [facility]) 2008  ? 09/14/2018  ? Tonsillar mass   ? Type 2 diabetes mellitus ([place]) 09/14/2018  ? Vertigo   ? Wears dentures   ? upper  ? Wears glasses   ? ? ?ALLERGIES:  is allergic to lisinopril, carvedilol, hydralazine, and lipitor [atorvastatin]. ? ?MEDICATIONS:  ?Current Outpatient Medications  ?Medication Sig Dispense Refill  ? acetaminophen (TYLENOL) 500 MG tablet Take 500-1,000 mg by mouth every 6 (six) hours as needed for moderate pain or headache.    ? amLODipine (NORVASC) 10 MG tablet TAKE 1 TABLET EVERY DAY (Patient taking differently: Take 10 mg by mouth daily.) 90 tablet 0  ? capecitabine (XELODA) 500 MG tablet Take 2 tablets (1,000 mg total) by mouth every 12 (twelve) hours. Take within 30 minutes after meals. Take on days of radiation, Monday through Friday. 60 tablet 1  ? cholecalciferol (VITAMIN D3) 25 MCG (1000 UNIT) tablet Take 2 tablets (2,000 Units total) by mouth daily. 100 tablet 1  ? clopidogrel (PLAVIX) 75 MG tablet TAKE 1 TABLET (75 MG TOTAL) BY MOUTH  DAILY. 90 tablet 0  ? colchicine 0.6 MG tablet Take 1 tablet (0.6 mg total) by mouth daily. 14 tablet 0  ? ferrous gluconate (FERGON) 324 MG tablet Take 1 tablet (324 mg total) by mouth daily with breakfast. 30 tablet 0  ? furosemide (LASIX) 40 MG tablet TAKE 1 TABLET (40 MG TOTAL) BY MOUTH 2 (TWO) TIMES DAILY. 180 tablet 0  ? isosorbide mononitrate (IMDUR) 60 MG 24 hr tablet Take 1.5 tablets (90 mg total) by mouth daily. 135 tablet 3  ? levalbuterol (XOPENEX HFA) 45 MCG/ACT inhaler Inhale into the lungs.    ? metoprolol succinate (TOPROL-XL)  100 MG 24 hr tablet Take 1 tablet (100 mg total) by mouth daily. Take with or immediately following a meal. 30 tablet 0  ? mometasone (ELOCON) 0.1 % ointment Apply topically daily. (Patient taking differen

## 2021-10-28 ENCOUNTER — Inpatient Hospital Stay (HOSPITAL_BASED_OUTPATIENT_CLINIC_OR_DEPARTMENT_OTHER): Payer: Medicare HMO | Admitting: Physician Assistant

## 2021-10-28 ENCOUNTER — Inpatient Hospital Stay: Payer: Medicare HMO

## 2021-10-28 ENCOUNTER — Ambulatory Visit
Admission: RE | Admit: 2021-10-28 | Discharge: 2021-10-28 | Disposition: A | Discharge status: Home / Self Care | Payer: Medicare HMO | Source: Ambulatory Visit | Attending: Radiation Oncology | Admitting: Radiation Oncology

## 2021-10-28 ENCOUNTER — Other Ambulatory Visit: Payer: Self-pay

## 2021-10-28 VITALS — BP 142/84 | HR 85 | Temp 97.2°F | Resp 17 | Wt 124.0 lb

## 2021-10-28 DIAGNOSIS — Z8616 Personal history of COVID-19: Secondary | ICD-10-CM | POA: Diagnosis not present

## 2021-10-28 DIAGNOSIS — N189 Chronic kidney disease, unspecified: Secondary | ICD-10-CM | POA: Diagnosis not present

## 2021-10-28 DIAGNOSIS — C20 Malignant neoplasm of rectum: Secondary | ICD-10-CM | POA: Diagnosis not present

## 2021-10-28 DIAGNOSIS — D631 Anemia in chronic kidney disease: Secondary | ICD-10-CM | POA: Diagnosis not present

## 2021-10-28 DIAGNOSIS — D500 Iron deficiency anemia secondary to blood loss (chronic): Secondary | ICD-10-CM | POA: Diagnosis not present

## 2021-10-28 DIAGNOSIS — R5383 Other fatigue: Secondary | ICD-10-CM | POA: Diagnosis not present

## 2021-10-28 DIAGNOSIS — Z510 Encounter for antineoplastic radiation therapy: Secondary | ICD-10-CM | POA: Diagnosis not present

## 2021-10-28 DIAGNOSIS — R609 Edema, unspecified: Secondary | ICD-10-CM | POA: Diagnosis not present

## 2021-10-28 DIAGNOSIS — Z79899 Other long term (current) drug therapy: Secondary | ICD-10-CM | POA: Diagnosis not present

## 2021-10-28 DIAGNOSIS — K922 Gastrointestinal hemorrhage, unspecified: Secondary | ICD-10-CM

## 2021-10-28 LAB — CMP (CANCER CENTER ONLY)
ALT: 6 U/L (ref 0–44)
AST: 16 U/L (ref 15–41)
Albumin: 3.2 g/dL — ABNORMAL LOW (ref 3.5–5.0)
Alkaline Phosphatase: 73 U/L (ref 38–126)
Anion gap: 7 (ref 5–15)
BUN: 13 mg/dL (ref 8–23)
CO2: 21 mmol/L — ABNORMAL LOW (ref 22–32)
Calcium: 8.5 mg/dL — ABNORMAL LOW (ref 8.9–10.3)
Chloride: 109 mmol/L (ref 98–111)
Creatinine: 1.05 mg/dL — ABNORMAL HIGH (ref 0.44–1.00)
GFR, Estimated: 54 mL/min — ABNORMAL LOW (ref >60)
Glucose, Bld: 98 mg/dL (ref 70–99)
Potassium: 3.7 mmol/L (ref 3.5–5.1)
Sodium: 137 mmol/L (ref 135–145)
Total Bilirubin: 0.3 mg/dL (ref 0.3–1.2)
Total Protein: 6.4 g/dL — ABNORMAL LOW (ref 6.5–8.1)

## 2021-10-28 LAB — CBC WITH DIFFERENTIAL (CANCER CENTER ONLY)
Abs Immature Granulocytes: 0.03 10*3/uL (ref 0.00–0.07)
Basophils Absolute: 0 10*3/uL (ref 0.0–0.1)
Basophils Relative: 0 %
Eosinophils Absolute: 0.2 10*3/uL (ref 0.0–0.5)
Eosinophils Relative: 4 %
HCT: 30.4 % — ABNORMAL LOW (ref 36.0–46.0)
Hemoglobin: 10 g/dL — ABNORMAL LOW (ref 12.0–15.0)
Immature Granulocytes: 1 %
Lymphocytes Relative: 28 %
Lymphs Abs: 1.6 10*3/uL (ref 0.7–4.0)
MCH: 31.5 pg (ref 26.0–34.0)
MCHC: 32.9 g/dL (ref 30.0–36.0)
MCV: 95.9 fL (ref 80.0–100.0)
Monocytes Absolute: 0.6 10*3/uL (ref 0.1–1.0)
Monocytes Relative: 10 %
Neutro Abs: 3.3 10*3/uL (ref 1.7–7.7)
Neutrophils Relative %: 57 %
Platelet Count: 291 10*3/uL (ref 150–400)
RBC: 3.17 MIL/uL — ABNORMAL LOW (ref 3.87–5.11)
RDW: 13.8 % (ref 11.5–15.5)
WBC Count: 5.7 10*3/uL (ref 4.0–10.5)
nRBC: 0 % (ref 0.0–0.2)

## 2021-10-29 ENCOUNTER — Other Ambulatory Visit: Payer: Self-pay

## 2021-10-29 ENCOUNTER — Other Ambulatory Visit (HOSPITAL_COMMUNITY): Payer: Self-pay

## 2021-10-29 ENCOUNTER — Ambulatory Visit
Admission: RE | Admit: 2021-10-29 | Discharge: 2021-10-29 | Disposition: A | Discharge status: Home / Self Care | Payer: Medicare HMO | Source: Ambulatory Visit | Attending: Radiation Oncology | Admitting: Radiation Oncology

## 2021-10-29 DIAGNOSIS — Z8616 Personal history of COVID-19: Secondary | ICD-10-CM | POA: Diagnosis not present

## 2021-10-29 DIAGNOSIS — D500 Iron deficiency anemia secondary to blood loss (chronic): Secondary | ICD-10-CM | POA: Diagnosis not present

## 2021-10-29 DIAGNOSIS — Z79899 Other long term (current) drug therapy: Secondary | ICD-10-CM | POA: Diagnosis not present

## 2021-10-29 DIAGNOSIS — N189 Chronic kidney disease, unspecified: Secondary | ICD-10-CM | POA: Diagnosis not present

## 2021-10-29 DIAGNOSIS — Z510 Encounter for antineoplastic radiation therapy: Secondary | ICD-10-CM | POA: Diagnosis not present

## 2021-10-29 DIAGNOSIS — D631 Anemia in chronic kidney disease: Secondary | ICD-10-CM | POA: Diagnosis not present

## 2021-10-29 DIAGNOSIS — R609 Edema, unspecified: Secondary | ICD-10-CM | POA: Diagnosis not present

## 2021-10-29 DIAGNOSIS — C20 Malignant neoplasm of rectum: Secondary | ICD-10-CM | POA: Diagnosis not present

## 2021-10-29 DIAGNOSIS — R5383 Other fatigue: Secondary | ICD-10-CM | POA: Diagnosis not present

## 2021-10-29 LAB — RAD ONC ARIA SESSION SUMMARY
Course Elapsed Days: 8
Plan Fractions Treated to Date: 7
Plan Prescribed Dose Per Fraction: 1.8 Gy
Plan Total Fractions Prescribed: 25
Plan Total Prescribed Dose: 45 Gy
Reference Point Dosage Given to Date: 12.6 Gy
Reference Point Session Dosage Given: 1.8 Gy
Session Number: 7

## 2021-10-29 LAB — IRON AND IRON BINDING CAPACITY (CC-WL,HP ONLY)
Iron: 86 ug/dL (ref 28–170)
Saturation Ratios: 40 % — ABNORMAL HIGH (ref 10.4–31.8)
TIBC: 214 ug/dL — ABNORMAL LOW (ref 250–450)
UIBC: 128 ug/dL — ABNORMAL LOW (ref 148–442)

## 2021-10-29 LAB — FERRITIN: Ferritin: 83 ng/mL

## 2021-10-30 ENCOUNTER — Ambulatory Visit
Admission: RE | Admit: 2021-10-30 | Discharge: 2021-10-30 | Disposition: A | Discharge status: Home / Self Care | Payer: Medicare HMO | Source: Ambulatory Visit | Attending: Radiation Oncology | Admitting: Radiation Oncology

## 2021-10-30 ENCOUNTER — Other Ambulatory Visit: Payer: Self-pay

## 2021-10-30 DIAGNOSIS — D500 Iron deficiency anemia secondary to blood loss (chronic): Secondary | ICD-10-CM | POA: Diagnosis not present

## 2021-10-30 DIAGNOSIS — C20 Malignant neoplasm of rectum: Secondary | ICD-10-CM | POA: Diagnosis not present

## 2021-10-30 DIAGNOSIS — Z510 Encounter for antineoplastic radiation therapy: Secondary | ICD-10-CM | POA: Diagnosis not present

## 2021-10-30 DIAGNOSIS — Z79899 Other long term (current) drug therapy: Secondary | ICD-10-CM | POA: Diagnosis not present

## 2021-10-30 DIAGNOSIS — R5383 Other fatigue: Secondary | ICD-10-CM | POA: Diagnosis not present

## 2021-10-30 DIAGNOSIS — R609 Edema, unspecified: Secondary | ICD-10-CM | POA: Diagnosis not present

## 2021-10-30 DIAGNOSIS — Z8616 Personal history of COVID-19: Secondary | ICD-10-CM | POA: Diagnosis not present

## 2021-10-30 DIAGNOSIS — D631 Anemia in chronic kidney disease: Secondary | ICD-10-CM | POA: Diagnosis not present

## 2021-10-30 DIAGNOSIS — N189 Chronic kidney disease, unspecified: Secondary | ICD-10-CM | POA: Diagnosis not present

## 2021-10-30 LAB — RAD ONC ARIA SESSION SUMMARY
Course Elapsed Days: 9
Plan Fractions Treated to Date: 8
Plan Prescribed Dose Per Fraction: 1.8 Gy
Plan Total Fractions Prescribed: 25
Plan Total Prescribed Dose: 45 Gy
Reference Point Dosage Given to Date: 14.4 Gy
Reference Point Session Dosage Given: 1.8 Gy
Session Number: 8

## 2021-10-31 ENCOUNTER — Other Ambulatory Visit (HOSPITAL_COMMUNITY): Payer: Self-pay

## 2021-10-31 ENCOUNTER — Other Ambulatory Visit: Payer: Self-pay

## 2021-10-31 ENCOUNTER — Ambulatory Visit
Admission: RE | Admit: 2021-10-31 | Discharge: 2021-10-31 | Disposition: A | Discharge status: Home / Self Care | Payer: Medicare HMO | Source: Ambulatory Visit | Attending: Radiation Oncology | Admitting: Radiation Oncology

## 2021-10-31 DIAGNOSIS — D631 Anemia in chronic kidney disease: Secondary | ICD-10-CM | POA: Diagnosis not present

## 2021-10-31 DIAGNOSIS — C20 Malignant neoplasm of rectum: Secondary | ICD-10-CM | POA: Diagnosis not present

## 2021-10-31 DIAGNOSIS — Z79899 Other long term (current) drug therapy: Secondary | ICD-10-CM | POA: Diagnosis not present

## 2021-10-31 DIAGNOSIS — R609 Edema, unspecified: Secondary | ICD-10-CM | POA: Diagnosis not present

## 2021-10-31 DIAGNOSIS — Z510 Encounter for antineoplastic radiation therapy: Secondary | ICD-10-CM | POA: Diagnosis not present

## 2021-10-31 DIAGNOSIS — N189 Chronic kidney disease, unspecified: Secondary | ICD-10-CM | POA: Diagnosis not present

## 2021-10-31 DIAGNOSIS — R5383 Other fatigue: Secondary | ICD-10-CM | POA: Diagnosis not present

## 2021-10-31 DIAGNOSIS — D500 Iron deficiency anemia secondary to blood loss (chronic): Secondary | ICD-10-CM | POA: Diagnosis not present

## 2021-10-31 DIAGNOSIS — Z8616 Personal history of COVID-19: Secondary | ICD-10-CM | POA: Diagnosis not present

## 2021-10-31 LAB — RAD ONC ARIA SESSION SUMMARY
Course Elapsed Days: 10
Plan Fractions Treated to Date: 9
Plan Prescribed Dose Per Fraction: 1.8 Gy
Plan Total Fractions Prescribed: 25
Plan Total Prescribed Dose: 45 Gy
Reference Point Dosage Given to Date: 16.2 Gy
Reference Point Session Dosage Given: 1.8 Gy
Session Number: 9

## 2021-11-01 ENCOUNTER — Other Ambulatory Visit: Payer: Self-pay

## 2021-11-01 ENCOUNTER — Ambulatory Visit
Admission: RE | Admit: 2021-11-01 | Discharge: 2021-11-01 | Disposition: A | Discharge status: Home / Self Care | Payer: Medicare HMO | Source: Ambulatory Visit | Attending: Radiation Oncology | Admitting: Radiation Oncology

## 2021-11-01 ENCOUNTER — Other Ambulatory Visit (HOSPITAL_COMMUNITY): Payer: Self-pay

## 2021-11-01 DIAGNOSIS — D500 Iron deficiency anemia secondary to blood loss (chronic): Secondary | ICD-10-CM | POA: Diagnosis not present

## 2021-11-01 DIAGNOSIS — Z79899 Other long term (current) drug therapy: Secondary | ICD-10-CM | POA: Diagnosis not present

## 2021-11-01 DIAGNOSIS — N189 Chronic kidney disease, unspecified: Secondary | ICD-10-CM | POA: Diagnosis not present

## 2021-11-01 DIAGNOSIS — Z8616 Personal history of COVID-19: Secondary | ICD-10-CM | POA: Diagnosis not present

## 2021-11-01 DIAGNOSIS — C20 Malignant neoplasm of rectum: Secondary | ICD-10-CM | POA: Diagnosis not present

## 2021-11-01 DIAGNOSIS — Z510 Encounter for antineoplastic radiation therapy: Secondary | ICD-10-CM | POA: Diagnosis not present

## 2021-11-01 DIAGNOSIS — R5383 Other fatigue: Secondary | ICD-10-CM | POA: Diagnosis not present

## 2021-11-01 DIAGNOSIS — R609 Edema, unspecified: Secondary | ICD-10-CM | POA: Diagnosis not present

## 2021-11-01 DIAGNOSIS — D631 Anemia in chronic kidney disease: Secondary | ICD-10-CM | POA: Diagnosis not present

## 2021-11-01 LAB — RAD ONC ARIA SESSION SUMMARY
Course Elapsed Days: 11
Plan Fractions Treated to Date: 10
Plan Prescribed Dose Per Fraction: 1.8 Gy
Plan Total Fractions Prescribed: 25
Plan Total Prescribed Dose: 45 Gy
Reference Point Dosage Given to Date: 18 Gy
Reference Point Session Dosage Given: 1.8 Gy
Session Number: 10

## 2021-11-02 NOTE — Progress Notes (Addendum)
[facility] ?OFFICE PROGRESS NOTE ? ?[name], MD ?[place] ?[place] [address] ? ?DIAGNOSIS:  f/u of rectal cancer ? ?Oncology History  ?Rectal cancer ([place])  ?08/15/2021 Cancer Staging  ? Staging form: Colon and Rectum, AJCC 8th Edition ?- Clinical stage from 08/15/2021: Stage I (cT2, cN0, cM0) - Signed by Truitt Merle, MD on 09/05/2021 ?Stage prefix: Initial diagnosis ?Total positive nodes: 0 ? ?  ?08/15/2021 Procedure  ? Colonoscopy, Dr. Rush Landmark ? ?Impression: ?- Palpable rectal mass found on digital rectal exam. ?- The examined portion of the ileum was normal. ?- Four 3 to 7 mm polyps at the recto-sigmoid colon, in the descending colon and in the ascending colon, removed with a cold snare. Resected and retrieved. ?- Rule out malignancy, partially obstructing tumor in the proximal/middle rectum. Biopsied. Tattooed proximal to lesion. ?- Diverticulosis in the entire examined colon. ?- Normal mucosa in the entire examined colon otherwise. ?- Non-bleeding non-thrombosed external and internal hemorrhoids. ?  ?08/15/2021 Initial Biopsy  ? FINAL MICROSCOPIC DIAGNOSIS:  ? ?A. STOMACH, BIOPSY:  ?- Gastric antral and oxyntic mucosa with nonspecific reactive  ?gastropathy  ?- Helicobacter pylori-like organisms are not identified on routine HE stain  ? ?B. DUODENUM, BIOPSY:  ?- Duodenal mucosa with no specific histopathologic changes  ?- Negative for increased intraepithelial lymphocytes or villous  ?architectural changes  ? ?C. COLON, ASCENDING, DESCENDING AND SIGMOID, POLYPECTOMY:  ?- Tubular adenoma(s)  ?- Negative for high-grade dysplasia or malignancy  ? ?D. RECTAL MASS, BIOPSY:  ?- Fragments of an adenoma with multifocal high-grade dysplasia, focally suspicious for intramucosal carcinoma.  See comment  ? ? ?COMMENT:  ?D.  A deeper, more severe process cannot be ruled out.  Clinical and endoscopic correlation is suggested.  ?  ?08/16/2021 Imaging  ? EXAM: ?CT CHEST, ABDOMEN, AND PELVIS  WITH CONTRAST ? ?IMPRESSION: ?1. Large rectal mass. Two tiny hypodense lesions in the liver are ?nonspecific with regard to benign etiology or metastatic disease. ?2. 9 mm in long axis hypodense pancreatic body lesion. Possibilities include postinflammatory cyst, intraductal papillary mucinous neoplasm, or less likely pancreatic adenocarcinoma. ?3. Bilateral renal cysts. A complex right kidney upper pole lesion ?is indeterminate for complex cyst versus mass and measures 9 mm in diameter. ?4. In light of these findings, consider upper abdominal MRI with ?contrast to further characterize the hepatic, pancreatic, and renal ?lesions. ?5. There is also unusual left perirenal stranding especially ?adjacent to a cyst along the left kidney lower pole, this could be ?an indicator of inflammation involving the left kidney but is ?technically nonspecific. Some of this edema tracks along the left ?paracolic gutter. ?6. Advanced systemic atherosclerosis. This includes aortic and ?coronary atherosclerosis along with mesenteric and renal ?atherosclerosis. If further workup of the patient's severe ?atherosclerotic vascular disease is warranted, CT angiography would be recommended. ?7. Small bilateral pleural effusions with passive atelectasis. ?8. Severe centrilobular emphysema.  Emphysema (ICD10-J43.9). ?9. Mild thoracolumbar scoliosis. ?10. Scattered sigmoid colon diverticula. ?  ?08/18/2021 Imaging  ? EXAM: ?MRI ABDOMEN WITHOUT AND WITH CONTRAST (INCLUDING MRCP) ? ?IMPRESSION: ?1. Study is severely limited by beam hardening artifact from a large ?surgical clip in the stomach, which completely obscures the ?previously noted pancreatic lesion on most pulse sequences rendering today's study nondiagnostic. The only visualized hepatic lesion is compatible with a small simple hepatic cyst. ?2. There is abnormal signal intensity and small rim enhancing ?lesions in the right psoas muscle. Clinical correlation for signs ?and symptoms of  psoas abscess is recommended. Alternatively, the possibility of intramuscular metastasis could be considered. ?3. Small bilateral pleural effusions lying dependently with areas of ?presumed passive atelectasis in the lower lobes of the lungs ?bilaterally. ?4. Multiple simple cysts in the kidneys bilaterally. ?5. Aortic atherosclerosis. ?  ?08/20/2021 Initial Diagnosis  ? Rectal cancer ([place]) ? ?  ?08/21/2021 Imaging  ? EXAM: ?MRI PELVIS WITHOUT CONTRAST ? ?IMPRESSION: ?Rectal adenocarcinoma T stage: T2 ?  ?Rectal adenocarcinoma N stage:  N0 ?  ?Distance from tumor to the internal anal sphincter is 4.5 cm. ?  ? ? ?CURRENT THERAPY: Concurrent chemoRT with Xeloda, started on 10/21/21.  ? ?INTERVAL HISTORY: ?Tina Waller 81 y.o. female returns to the clinic today for a follow up visit. The patient recently started treatment with concurrent chemoRT with Xeloda. Her first dose was on 10/21/21. She has been on this for 2 weeks and is here for a toxicity check. She is tolerating her first 2 weeks of treatment well without any concerning adverse side effects that she has noticed except she does report some occasional diarrhea about 1x per day but has taken imodium which has helped somewhat.  ? ?Otherwise, her only concern today is for over the last month or so,is bilateral foot/ankle swelling (R>L). She is on norvasc for her BP. She also is on lasix (which she has been taking two tablets once a day vs prescribed 1 tablet BID). She has some soreness/swelling, particularly on the dorsal aspect of the foot. She denies falls or injuries. These symptoms pre-dated starting xeloda, and this was also mentioned at the 4/10 office visit per chart review. She reports her symptoms are not worsening but persistent. She tries to wear compression stockings and elevate her lower extremities. She has a history of gout in her feet but these symptoms are different reportedly and is not having as much pain compared to her usual gouty attacks.  Denies calf pain. She has some lower extremity bilateral skin darkening/dry skin. She denies any erythema, pain, or peeling on her palms of her hands/soles of her foot. Denies any numbness or tingling. ? ?She denies any recent fevers, chills, night sweats, or weight loss. Her appetite is "good". She denies cough, shortness of breath, or chest pain. She denies nausea, vomiting,  or constipation. She denies rectal pain. She reports some anal itching for which she uses an "ointment" for that provides relief. This has been helping. She sometimes has bleeding on the toilet paper with a bowel movement since December 2022. She reports it is a little bit of blood, but denies blood since last being seen. She is compliant with her iron supplement. Denies changes with her bladder habits. She denies abdominal pain.She denies mucositis. She is still doing physical therapy once a week. She takes Norvasc and metoprolol for her BP. She states she took this today as prescribed. She has the capabilities to check her BP at home and has a BP cuff.  She denies headaches or speech changes. She is here for evaluation and repeat blood work.  ? ?MEDICAL HISTORY: ?Past Medical History:  ?Diagnosis Date  ? Anemia   ? Anxiety   ? Cataract   ? Depression   ? Diabetes mellitus without complication ([place])   ? pt denies  ? GERD (gastroesophageal reflux disease)   ? Gout   ? Headache   ? Hypertension   ? Stroke [facility]) 2008  ? 09/14/2018  ? Tonsillar mass   ? Type 2 diabetes mellitus ([place]) 09/14/2018  ?  Vertigo   ? Wears dentures   ? upper  ? Wears glasses   ? ? ?ALLERGIES:  is allergic to lisinopril, carvedilol, hydralazine, and lipitor [atorvastatin]. ? ?MEDICATIONS:  ?Current Outpatient Medications  ?Medication Sig Dispense Refill  ? acetaminophen (TYLENOL) 500 MG tablet Take 500-1,000 mg by mouth every 6 (six) hours as needed for moderate pain or headache.    ? amLODipine (NORVASC) 10 MG tablet TAKE 1 TABLET EVERY DAY (Patient taking differently: Take  10 mg by mouth daily.) 90 tablet 0  ? capecitabine (XELODA) 500 MG tablet Take 2 tablets (1,000 mg total) by mouth every 12 (twelve) hours. Take within 30 minutes after meals. Take on days of radiation, M

## 2021-11-04 ENCOUNTER — Other Ambulatory Visit: Payer: Self-pay

## 2021-11-04 ENCOUNTER — Ambulatory Visit
Admission: RE | Admit: 2021-11-04 | Discharge: 2021-11-04 | Disposition: A | Discharge status: Home / Self Care | Payer: Medicare HMO | Source: Ambulatory Visit | Attending: Radiation Oncology | Admitting: Radiation Oncology

## 2021-11-04 DIAGNOSIS — N189 Chronic kidney disease, unspecified: Secondary | ICD-10-CM | POA: Diagnosis not present

## 2021-11-04 DIAGNOSIS — Z510 Encounter for antineoplastic radiation therapy: Secondary | ICD-10-CM | POA: Diagnosis not present

## 2021-11-04 DIAGNOSIS — R609 Edema, unspecified: Secondary | ICD-10-CM | POA: Diagnosis not present

## 2021-11-04 DIAGNOSIS — D631 Anemia in chronic kidney disease: Secondary | ICD-10-CM | POA: Diagnosis not present

## 2021-11-04 DIAGNOSIS — Z79899 Other long term (current) drug therapy: Secondary | ICD-10-CM | POA: Diagnosis not present

## 2021-11-04 DIAGNOSIS — Z8616 Personal history of COVID-19: Secondary | ICD-10-CM | POA: Diagnosis not present

## 2021-11-04 DIAGNOSIS — R5383 Other fatigue: Secondary | ICD-10-CM | POA: Diagnosis not present

## 2021-11-04 DIAGNOSIS — D500 Iron deficiency anemia secondary to blood loss (chronic): Secondary | ICD-10-CM | POA: Diagnosis not present

## 2021-11-04 DIAGNOSIS — C20 Malignant neoplasm of rectum: Secondary | ICD-10-CM | POA: Diagnosis not present

## 2021-11-04 LAB — RAD ONC ARIA SESSION SUMMARY
Course Elapsed Days: 14
Plan Fractions Treated to Date: 11
Plan Prescribed Dose Per Fraction: 1.8 Gy
Plan Total Fractions Prescribed: 25
Plan Total Prescribed Dose: 45 Gy
Reference Point Dosage Given to Date: 19.8 Gy
Reference Point Session Dosage Given: 1.8 Gy
Session Number: 11

## 2021-11-05 ENCOUNTER — Inpatient Hospital Stay: Payer: Medicare HMO

## 2021-11-05 ENCOUNTER — Encounter: Payer: Self-pay | Admitting: Physician Assistant

## 2021-11-05 ENCOUNTER — Other Ambulatory Visit: Payer: Self-pay | Admitting: Physician Assistant

## 2021-11-05 ENCOUNTER — Other Ambulatory Visit: Payer: Self-pay

## 2021-11-05 ENCOUNTER — Inpatient Hospital Stay (HOSPITAL_BASED_OUTPATIENT_CLINIC_OR_DEPARTMENT_OTHER): Payer: Medicare HMO | Admitting: Physician Assistant

## 2021-11-05 ENCOUNTER — Other Ambulatory Visit (HOSPITAL_COMMUNITY): Payer: Self-pay

## 2021-11-05 ENCOUNTER — Ambulatory Visit
Admission: RE | Admit: 2021-11-05 | Discharge: 2021-11-05 | Disposition: A | Discharge status: Home / Self Care | Payer: Medicare HMO | Source: Ambulatory Visit | Attending: Radiation Oncology | Admitting: Radiation Oncology

## 2021-11-05 VITALS — BP 160/80 | HR 79 | Temp 97.8°F | Resp 18 | Wt 125.1 lb

## 2021-11-05 DIAGNOSIS — Z8616 Personal history of COVID-19: Secondary | ICD-10-CM | POA: Diagnosis not present

## 2021-11-05 DIAGNOSIS — C20 Malignant neoplasm of rectum: Secondary | ICD-10-CM

## 2021-11-05 DIAGNOSIS — Z79899 Other long term (current) drug therapy: Secondary | ICD-10-CM | POA: Diagnosis not present

## 2021-11-05 DIAGNOSIS — N189 Chronic kidney disease, unspecified: Secondary | ICD-10-CM | POA: Diagnosis not present

## 2021-11-05 DIAGNOSIS — Z510 Encounter for antineoplastic radiation therapy: Secondary | ICD-10-CM | POA: Diagnosis not present

## 2021-11-05 DIAGNOSIS — R609 Edema, unspecified: Secondary | ICD-10-CM | POA: Diagnosis not present

## 2021-11-05 DIAGNOSIS — M79671 Pain in right foot: Secondary | ICD-10-CM | POA: Diagnosis not present

## 2021-11-05 DIAGNOSIS — D631 Anemia in chronic kidney disease: Secondary | ICD-10-CM | POA: Diagnosis not present

## 2021-11-05 DIAGNOSIS — K922 Gastrointestinal hemorrhage, unspecified: Secondary | ICD-10-CM

## 2021-11-05 DIAGNOSIS — D500 Iron deficiency anemia secondary to blood loss (chronic): Secondary | ICD-10-CM | POA: Diagnosis not present

## 2021-11-05 DIAGNOSIS — R2241 Localized swelling, mass and lump, right lower limb: Secondary | ICD-10-CM | POA: Diagnosis not present

## 2021-11-05 DIAGNOSIS — R5383 Other fatigue: Secondary | ICD-10-CM | POA: Diagnosis not present

## 2021-11-05 DIAGNOSIS — D6481 Anemia due to antineoplastic chemotherapy: Secondary | ICD-10-CM

## 2021-11-05 LAB — CBC WITH DIFFERENTIAL (CANCER CENTER ONLY)
Abs Immature Granulocytes: 0.01 10*3/uL (ref 0.00–0.07)
Basophils Absolute: 0 10*3/uL (ref 0.0–0.1)
Basophils Relative: 0 %
Eosinophils Absolute: 0.3 10*3/uL (ref 0.0–0.5)
Eosinophils Relative: 7 %
HCT: 27.9 % — ABNORMAL LOW (ref 36.0–46.0)
Hemoglobin: 9.2 g/dL — ABNORMAL LOW (ref 12.0–15.0)
Immature Granulocytes: 0 %
Lymphocytes Relative: 22 %
Lymphs Abs: 0.8 10*3/uL (ref 0.7–4.0)
MCH: 31.8 pg (ref 26.0–34.0)
MCHC: 33 g/dL (ref 30.0–36.0)
MCV: 96.5 fL (ref 80.0–100.0)
Monocytes Absolute: 0.5 10*3/uL (ref 0.1–1.0)
Monocytes Relative: 14 %
Neutro Abs: 2.1 10*3/uL (ref 1.7–7.7)
Neutrophils Relative %: 57 %
Platelet Count: 195 10*3/uL (ref 150–400)
RBC: 2.89 MIL/uL — ABNORMAL LOW (ref 3.87–5.11)
RDW: 14.6 % (ref 11.5–15.5)
WBC Count: 3.7 10*3/uL — ABNORMAL LOW (ref 4.0–10.5)
nRBC: 0 % (ref 0.0–0.2)

## 2021-11-05 LAB — CMP (CANCER CENTER ONLY)
ALT: 6 U/L (ref 0–44)
AST: 12 U/L — ABNORMAL LOW (ref 15–41)
Albumin: 3.2 g/dL — ABNORMAL LOW (ref 3.5–5.0)
Alkaline Phosphatase: 80 U/L (ref 38–126)
Anion gap: 7 (ref 5–15)
BUN: 12 mg/dL (ref 8–23)
CO2: 23 mmol/L (ref 22–32)
Calcium: 8.7 mg/dL — ABNORMAL LOW (ref 8.9–10.3)
Chloride: 110 mmol/L (ref 98–111)
Creatinine: 1.05 mg/dL — ABNORMAL HIGH (ref 0.44–1.00)
GFR, Estimated: 54 mL/min — ABNORMAL LOW (ref >60)
Glucose, Bld: 96 mg/dL (ref 70–99)
Potassium: 4.3 mmol/L (ref 3.5–5.1)
Sodium: 140 mmol/L (ref 135–145)
Total Bilirubin: 0.5 mg/dL (ref 0.3–1.2)
Total Protein: 6 g/dL — ABNORMAL LOW (ref 6.5–8.1)

## 2021-11-05 LAB — RAD ONC ARIA SESSION SUMMARY
Course Elapsed Days: 15
Plan Fractions Treated to Date: 12
Plan Prescribed Dose Per Fraction: 1.8 Gy
Plan Total Fractions Prescribed: 25
Plan Total Prescribed Dose: 45 Gy
Reference Point Dosage Given to Date: 21.6 Gy
Reference Point Session Dosage Given: 1.8 Gy
Session Number: 12

## 2021-11-05 LAB — IRON AND IRON BINDING CAPACITY (CC-WL,HP ONLY)
Iron: 89 ug/dL (ref 28–170)
Saturation Ratios: 41 % — ABNORMAL HIGH (ref 10.4–31.8)
TIBC: 217 ug/dL — ABNORMAL LOW (ref 250–450)
UIBC: 128 ug/dL — ABNORMAL LOW (ref 148–442)

## 2021-11-05 LAB — FERRITIN: Ferritin: 97 ng/mL (ref 11–307)

## 2021-11-06 ENCOUNTER — Ambulatory Visit: Payer: Medicare HMO

## 2021-11-07 ENCOUNTER — Other Ambulatory Visit: Payer: Self-pay

## 2021-11-07 ENCOUNTER — Ambulatory Visit
Admission: RE | Admit: 2021-11-07 | Discharge: 2021-11-07 | Disposition: A | Discharge status: Home / Self Care | Payer: Medicare HMO | Source: Ambulatory Visit | Attending: Radiation Oncology | Admitting: Radiation Oncology

## 2021-11-07 DIAGNOSIS — Z79899 Other long term (current) drug therapy: Secondary | ICD-10-CM | POA: Diagnosis not present

## 2021-11-07 DIAGNOSIS — D631 Anemia in chronic kidney disease: Secondary | ICD-10-CM | POA: Diagnosis not present

## 2021-11-07 DIAGNOSIS — Z510 Encounter for antineoplastic radiation therapy: Secondary | ICD-10-CM | POA: Diagnosis not present

## 2021-11-07 DIAGNOSIS — D500 Iron deficiency anemia secondary to blood loss (chronic): Secondary | ICD-10-CM | POA: Diagnosis not present

## 2021-11-07 DIAGNOSIS — R5383 Other fatigue: Secondary | ICD-10-CM | POA: Diagnosis not present

## 2021-11-07 DIAGNOSIS — C20 Malignant neoplasm of rectum: Secondary | ICD-10-CM | POA: Diagnosis not present

## 2021-11-07 DIAGNOSIS — N189 Chronic kidney disease, unspecified: Secondary | ICD-10-CM | POA: Diagnosis not present

## 2021-11-07 DIAGNOSIS — R609 Edema, unspecified: Secondary | ICD-10-CM | POA: Diagnosis not present

## 2021-11-07 DIAGNOSIS — Z8616 Personal history of COVID-19: Secondary | ICD-10-CM | POA: Diagnosis not present

## 2021-11-07 LAB — RAD ONC ARIA SESSION SUMMARY
Course Elapsed Days: 17
Plan Fractions Treated to Date: 13
Plan Prescribed Dose Per Fraction: 1.8 Gy
Plan Total Fractions Prescribed: 25
Plan Total Prescribed Dose: 45 Gy
Reference Point Dosage Given to Date: 23.4 Gy
Reference Point Session Dosage Given: 1.8 Gy
Session Number: 13

## 2021-11-08 ENCOUNTER — Ambulatory Visit
Admission: RE | Admit: 2021-11-08 | Discharge: 2021-11-08 | Disposition: A | Discharge status: Home / Self Care | Payer: Medicare HMO | Source: Ambulatory Visit | Attending: Radiation Oncology | Admitting: Radiation Oncology

## 2021-11-08 ENCOUNTER — Other Ambulatory Visit: Payer: Self-pay

## 2021-11-08 DIAGNOSIS — D500 Iron deficiency anemia secondary to blood loss (chronic): Secondary | ICD-10-CM | POA: Diagnosis not present

## 2021-11-08 DIAGNOSIS — R5383 Other fatigue: Secondary | ICD-10-CM | POA: Diagnosis not present

## 2021-11-08 DIAGNOSIS — R609 Edema, unspecified: Secondary | ICD-10-CM | POA: Diagnosis not present

## 2021-11-08 DIAGNOSIS — D631 Anemia in chronic kidney disease: Secondary | ICD-10-CM | POA: Diagnosis not present

## 2021-11-08 DIAGNOSIS — N189 Chronic kidney disease, unspecified: Secondary | ICD-10-CM | POA: Diagnosis not present

## 2021-11-08 DIAGNOSIS — Z510 Encounter for antineoplastic radiation therapy: Secondary | ICD-10-CM | POA: Diagnosis not present

## 2021-11-08 DIAGNOSIS — Z79899 Other long term (current) drug therapy: Secondary | ICD-10-CM | POA: Diagnosis not present

## 2021-11-08 DIAGNOSIS — Z8616 Personal history of COVID-19: Secondary | ICD-10-CM | POA: Diagnosis not present

## 2021-11-08 DIAGNOSIS — C20 Malignant neoplasm of rectum: Secondary | ICD-10-CM | POA: Diagnosis not present

## 2021-11-08 LAB — RAD ONC ARIA SESSION SUMMARY
Course Elapsed Days: 18
Plan Fractions Treated to Date: 14
Plan Prescribed Dose Per Fraction: 1.8 Gy
Plan Total Fractions Prescribed: 25
Plan Total Prescribed Dose: 45 Gy
Reference Point Dosage Given to Date: 25.2 Gy
Reference Point Session Dosage Given: 1.8 Gy
Session Number: 14

## 2021-11-11 ENCOUNTER — Ambulatory Visit
Admission: RE | Admit: 2021-11-11 | Discharge: 2021-11-11 | Disposition: A | Discharge status: Home / Self Care | Payer: Medicare HMO | Source: Ambulatory Visit | Attending: Radiation Oncology | Admitting: Radiation Oncology

## 2021-11-11 ENCOUNTER — Other Ambulatory Visit: Payer: Self-pay

## 2021-11-11 DIAGNOSIS — N189 Chronic kidney disease, unspecified: Secondary | ICD-10-CM | POA: Diagnosis not present

## 2021-11-11 DIAGNOSIS — Z8616 Personal history of COVID-19: Secondary | ICD-10-CM | POA: Diagnosis not present

## 2021-11-11 DIAGNOSIS — Z79899 Other long term (current) drug therapy: Secondary | ICD-10-CM | POA: Insufficient documentation

## 2021-11-11 DIAGNOSIS — R5383 Other fatigue: Secondary | ICD-10-CM | POA: Diagnosis not present

## 2021-11-11 DIAGNOSIS — R609 Edema, unspecified: Secondary | ICD-10-CM | POA: Insufficient documentation

## 2021-11-11 DIAGNOSIS — C20 Malignant neoplasm of rectum: Secondary | ICD-10-CM | POA: Insufficient documentation

## 2021-11-11 DIAGNOSIS — D500 Iron deficiency anemia secondary to blood loss (chronic): Secondary | ICD-10-CM | POA: Insufficient documentation

## 2021-11-11 DIAGNOSIS — Z510 Encounter for antineoplastic radiation therapy: Secondary | ICD-10-CM | POA: Diagnosis not present

## 2021-11-11 DIAGNOSIS — D631 Anemia in chronic kidney disease: Secondary | ICD-10-CM | POA: Insufficient documentation

## 2021-11-11 LAB — RAD ONC ARIA SESSION SUMMARY
Course Elapsed Days: 21
Plan Fractions Treated to Date: 15
Plan Prescribed Dose Per Fraction: 1.8 Gy
Plan Total Fractions Prescribed: 25
Plan Total Prescribed Dose: 45 Gy
Reference Point Dosage Given to Date: 27 Gy
Reference Point Session Dosage Given: 1.8 Gy
Session Number: 15

## 2021-11-12 ENCOUNTER — Other Ambulatory Visit: Payer: Self-pay

## 2021-11-12 ENCOUNTER — Ambulatory Visit (HOSPITAL_BASED_OUTPATIENT_CLINIC_OR_DEPARTMENT_OTHER)
Admission: RE | Admit: 2021-11-12 | Discharge: 2021-11-12 | Disposition: A | Discharge status: Home / Self Care | Payer: Medicare HMO | Source: Ambulatory Visit | Attending: Physician Assistant | Admitting: Physician Assistant

## 2021-11-12 ENCOUNTER — Encounter: Payer: Self-pay | Admitting: Hematology

## 2021-11-12 ENCOUNTER — Inpatient Hospital Stay: Payer: Medicare HMO

## 2021-11-12 ENCOUNTER — Inpatient Hospital Stay: Payer: Medicare HMO | Admitting: Hematology

## 2021-11-12 ENCOUNTER — Ambulatory Visit
Admission: RE | Admit: 2021-11-12 | Discharge: 2021-11-12 | Disposition: A | Discharge status: Home / Self Care | Payer: Medicare HMO | Source: Ambulatory Visit | Attending: Radiation Oncology | Admitting: Radiation Oncology

## 2021-11-12 VITALS — BP 186/81 | HR 79 | Temp 97.9°F | Resp 19 | Ht 69.0 in | Wt 122.7 lb

## 2021-11-12 DIAGNOSIS — K922 Gastrointestinal hemorrhage, unspecified: Secondary | ICD-10-CM

## 2021-11-12 DIAGNOSIS — D631 Anemia in chronic kidney disease: Secondary | ICD-10-CM | POA: Insufficient documentation

## 2021-11-12 DIAGNOSIS — C20 Malignant neoplasm of rectum: Secondary | ICD-10-CM

## 2021-11-12 DIAGNOSIS — R609 Edema, unspecified: Secondary | ICD-10-CM | POA: Insufficient documentation

## 2021-11-12 DIAGNOSIS — D6481 Anemia due to antineoplastic chemotherapy: Secondary | ICD-10-CM

## 2021-11-12 DIAGNOSIS — Z8616 Personal history of COVID-19: Secondary | ICD-10-CM | POA: Insufficient documentation

## 2021-11-12 DIAGNOSIS — D500 Iron deficiency anemia secondary to blood loss (chronic): Secondary | ICD-10-CM | POA: Insufficient documentation

## 2021-11-12 DIAGNOSIS — R2241 Localized swelling, mass and lump, right lower limb: Secondary | ICD-10-CM | POA: Insufficient documentation

## 2021-11-12 DIAGNOSIS — I129 Hypertensive chronic kidney disease with stage 1 through stage 4 chronic kidney disease, or unspecified chronic kidney disease: Secondary | ICD-10-CM | POA: Insufficient documentation

## 2021-11-12 DIAGNOSIS — R531 Weakness: Secondary | ICD-10-CM | POA: Insufficient documentation

## 2021-11-12 DIAGNOSIS — N189 Chronic kidney disease, unspecified: Secondary | ICD-10-CM | POA: Insufficient documentation

## 2021-11-12 DIAGNOSIS — R5383 Other fatigue: Secondary | ICD-10-CM | POA: Insufficient documentation

## 2021-11-12 DIAGNOSIS — M79671 Pain in right foot: Secondary | ICD-10-CM | POA: Insufficient documentation

## 2021-11-12 DIAGNOSIS — Z79899 Other long term (current) drug therapy: Secondary | ICD-10-CM | POA: Diagnosis not present

## 2021-11-12 DIAGNOSIS — Z510 Encounter for antineoplastic radiation therapy: Secondary | ICD-10-CM | POA: Diagnosis not present

## 2021-11-12 LAB — RAD ONC ARIA SESSION SUMMARY
Course Elapsed Days: 22
Plan Fractions Treated to Date: 16
Plan Prescribed Dose Per Fraction: 1.8 Gy
Plan Total Fractions Prescribed: 25
Plan Total Prescribed Dose: 45 Gy
Reference Point Dosage Given to Date: 28.8 Gy
Reference Point Session Dosage Given: 1.8 Gy
Session Number: 16

## 2021-11-12 LAB — CBC WITH DIFFERENTIAL (CANCER CENTER ONLY)
Abs Immature Granulocytes: 0.02 10*3/uL (ref 0.00–0.07)
Basophils Absolute: 0 10*3/uL (ref 0.0–0.1)
Basophils Relative: 1 %
Eosinophils Absolute: 0.3 10*3/uL (ref 0.0–0.5)
Eosinophils Relative: 8 %
HCT: 27.5 % — ABNORMAL LOW (ref 36.0–46.0)
Hemoglobin: 9.2 g/dL — ABNORMAL LOW (ref 12.0–15.0)
Immature Granulocytes: 1 %
Lymphocytes Relative: 13 %
Lymphs Abs: 0.5 10*3/uL — ABNORMAL LOW (ref 0.7–4.0)
MCH: 32.7 pg (ref 26.0–34.0)
MCHC: 33.5 g/dL (ref 30.0–36.0)
MCV: 97.9 fL (ref 80.0–100.0)
Monocytes Absolute: 0.5 10*3/uL (ref 0.1–1.0)
Monocytes Relative: 13 %
Neutro Abs: 2.5 10*3/uL (ref 1.7–7.7)
Neutrophils Relative %: 64 %
Platelet Count: 198 10*3/uL (ref 150–400)
RBC: 2.81 MIL/uL — ABNORMAL LOW (ref 3.87–5.11)
RDW: 16.1 % — ABNORMAL HIGH (ref 11.5–15.5)
WBC Count: 3.9 10*3/uL — ABNORMAL LOW (ref 4.0–10.5)
nRBC: 0 % (ref 0.0–0.2)

## 2021-11-12 LAB — CMP (CANCER CENTER ONLY)
ALT: 6 U/L (ref 0–44)
AST: 12 U/L — ABNORMAL LOW (ref 15–41)
Albumin: 3.3 g/dL — ABNORMAL LOW (ref 3.5–5.0)
Alkaline Phosphatase: 74 U/L (ref 38–126)
Anion gap: 7 (ref 5–15)
BUN: 10 mg/dL (ref 8–23)
CO2: 24 mmol/L (ref 22–32)
Calcium: 8.6 mg/dL — ABNORMAL LOW (ref 8.9–10.3)
Chloride: 110 mmol/L (ref 98–111)
Creatinine: 1.04 mg/dL — ABNORMAL HIGH (ref 0.44–1.00)
GFR, Estimated: 55 mL/min — ABNORMAL LOW (ref >60)
Glucose, Bld: 79 mg/dL (ref 70–99)
Potassium: 3.2 mmol/L — ABNORMAL LOW (ref 3.5–5.1)
Sodium: 141 mmol/L (ref 135–145)
Total Bilirubin: 0.4 mg/dL (ref 0.3–1.2)
Total Protein: 5.8 g/dL — ABNORMAL LOW (ref 6.5–8.1)

## 2021-11-12 LAB — IRON AND IRON BINDING CAPACITY (CC-WL,HP ONLY)
Iron: 105 ug/dL (ref 28–170)
Saturation Ratios: 49 % — ABNORMAL HIGH (ref 10.4–31.8)
TIBC: 213 ug/dL — ABNORMAL LOW (ref 250–450)
UIBC: 108 ug/dL — ABNORMAL LOW (ref 148–442)

## 2021-11-12 LAB — SAMPLE TO BLOOD BANK

## 2021-11-12 LAB — FERRITIN: Ferritin: 92 ng/mL (ref 11–307)

## 2021-11-12 LAB — CEA (IN HOUSE-CHCC): CEA (CHCC-In House): 2.41 ng/mL (ref 0.00–5.00)

## 2021-11-12 MED ORDER — POTASSIUM CHLORIDE CRYS ER 10 MEQ PO TBCR: 10.0000 meq | EXTENDED_RELEASE_TABLET | Freq: Two times a day (BID) | ORAL | 0 refills | Status: AC

## 2021-11-12 NOTE — Progress Notes (Signed)
?[facility]   ?[phone] [phone]   ?Clinic Follow up Note  ? ?Patient Care Team: ?[name], MD as PCP - General (Internal Medicine) ?Truitt Merle, MD as Consulting Physician (Hematology) ? ?Date of Service:  11/12/2021 ? ?CHIEF COMPLAINT: f/u of rectal cancer ? ?CURRENT THERAPY:  ?Concurrent chemoRT with Xeloda, started on 10/21/21.  ? ?ASSESSMENT & PLAN:  ?Tina Waller is a 81 y.o. female with  ? ?1. Rectal Cancer, stage I c(T2, N0)M0 ?-presented to ED on 08/10/21 with weakness and black stool. Colonoscopy and EGD on 08/15/21 by Dr. Rush Landmark showed a palpable, partially obstructing rectal mass. Path confirmed fragments of adenocarcinoma with multifocal high-grade dysplasia and focally suspicious for intramucosal carcinoma. ?-CT CAP on 08/16/21 showed: two tiny nonspecific liver lesions; 9 mm hypodense pancreatic body lesion. MRCP on 08/18/21 showed hepatic lesion compatible with simple hepatic cyst, within limitation of beam hardening artifact, and small lesions in right psoas muscle. ?-baseline CEA on 08/16/21 was normal at 3.7. ?-staging pelvis MRI on 08/21/21 showed stage T2 N0 rectal cancer. ?-she met Dr. Marcello Moores on 09/23/21. Pt declines surgery due to concern for side effects/quality of life as she lives alone. ?-started concurrent radiation with Xeloda (1000 mg BID) first dose 10/21/21. She is tolerating well.  ?-Labs reviewed, overall stable. We will continue to monitor. ?  ?2. Bilateral foot edema ?-present prior to cancer treatment. ?-she is on lasix per her PCP for HTN. ?-today she denies pain but notes continued tenderness to palpation. ?-doppler was ordered but has not been scheduled. We will try to obtain this. ?-reminded patient to continue to elevate her legs and use compression stockings.   ?  ?3. Weakness and fatigue, COVID+ 08/10/21 ?-she is now unable to walk independently, uses walker. ?-she endorses doing home PT once a week ?  ?4. Anemia ?-secondary to GI  blood loss and CKD ?-Hgb stable at 9.2 today (11/12/21) ?-currently on iron pills.  ?-iron panel from last week shows no evidence of deficiency, results from today are pending. ?-continue to monitor.  ?  ?5. Social Support ?-she previously lived independently and did not require assistance. ?-she is currently in an apartment and has support from some of her neighbors. ?-she has a son in [place], who visits her every other week, but no other nearby friends or relatives. ?  ?6. Hypertension ?-managed by PCP ?-Complaint with her norvac and metoprolol. She also takes lasix. ?-pt previously kept a BP diary, which showed some elevated readings, indicating she also has some white coat syndrome. ?-BP today remains elevated at 186/81 (11/12/21) ?  ?  ?PLAN: ?-continue Xeloda ?-Patient will proceed with radiation therapy today.  ?-U/S lower extremity today to rule out DVT (done today, negative) ?-lab and f/u weekly while on radiation ? ? ?No problem-specific Assessment & Plan notes found for this encounter. ? ? ?SUMMARY OF ONCOLOGIC HISTORY: ?Oncology History  ?Rectal cancer ([place])  ?08/15/2021 Cancer Staging  ? Staging form: Colon and Rectum, AJCC 8th Edition ?- Clinical stage from 08/15/2021: Stage I (cT2, cN0, cM0) - Signed by Truitt Merle, MD on 09/05/2021 ?Stage prefix: Initial diagnosis ?Total positive nodes: 0 ? ?  ?08/15/2021 Procedure  ? Colonoscopy, Dr. Rush Landmark ? ?Impression: ?- Palpable rectal mass found on digital rectal exam. ?- The examined portion of the ileum was normal. ?- Four 3 to 7 mm polyps at the recto-sigmoid colon, in the descending colon and in the ascending colon, removed with a cold snare. Resected and  retrieved. ?- Rule out malignancy, partially obstructing tumor in the proximal/middle rectum. Biopsied. Tattooed proximal to lesion. ?- Diverticulosis in the entire examined colon. ?- Normal mucosa in the entire examined colon otherwise. ?- Non-bleeding non-thrombosed external and internal hemorrhoids. ?   ?08/15/2021 Initial Biopsy  ? FINAL MICROSCOPIC DIAGNOSIS:  ? ?A. STOMACH, BIOPSY:  ?- Gastric antral and oxyntic mucosa with nonspecific reactive  ?gastropathy  ?- Helicobacter pylori-like organisms are not identified on routine HE stain  ? ?B. DUODENUM, BIOPSY:  ?- Duodenal mucosa with no specific histopathologic changes  ?- Negative for increased intraepithelial lymphocytes or villous  ?architectural changes  ? ?C. COLON, ASCENDING, DESCENDING AND SIGMOID, POLYPECTOMY:  ?- Tubular adenoma(s)  ?- Negative for high-grade dysplasia or malignancy  ? ?D. RECTAL MASS, BIOPSY:  ?- Fragments of an adenoma with multifocal high-grade dysplasia, focally suspicious for intramucosal carcinoma.  See comment  ? ? ?COMMENT:  ?D.  A deeper, more severe process cannot be ruled out.  Clinical and endoscopic correlation is suggested.  ?  ?08/16/2021 Imaging  ? EXAM: ?CT CHEST, ABDOMEN, AND PELVIS WITH CONTRAST ? ?IMPRESSION: ?1. Large rectal mass. Two tiny hypodense lesions in the liver are ?nonspecific with regard to benign etiology or metastatic disease. ?2. 9 mm in long axis hypodense pancreatic body lesion. Possibilities include postinflammatory cyst, intraductal papillary mucinous neoplasm, or less likely pancreatic adenocarcinoma. ?3. Bilateral renal cysts. A complex right kidney upper pole lesion ?is indeterminate for complex cyst versus mass and measures 9 mm in diameter. ?4. In light of these findings, consider upper abdominal MRI with ?contrast to further characterize the hepatic, pancreatic, and renal ?lesions. ?5. There is also unusual left perirenal stranding especially ?adjacent to a cyst along the left kidney lower pole, this could be ?an indicator of inflammation involving the left kidney but is ?technically nonspecific. Some of this edema tracks along the left ?paracolic gutter. ?6. Advanced systemic atherosclerosis. This includes aortic and ?coronary atherosclerosis along with mesenteric and renal ?atherosclerosis. If  further workup of the patient's severe ?atherosclerotic vascular disease is warranted, CT angiography would be recommended. ?7. Small bilateral pleural effusions with passive atelectasis. ?8. Severe centrilobular emphysema.  Emphysema (ICD10-J43.9). ?9. Mild thoracolumbar scoliosis. ?10. Scattered sigmoid colon diverticula. ?  ?08/18/2021 Imaging  ? EXAM: ?MRI ABDOMEN WITHOUT AND WITH CONTRAST (INCLUDING MRCP) ? ?IMPRESSION: ?1. Study is severely limited by beam hardening artifact from a large ?surgical clip in the stomach, which completely obscures the ?previously noted pancreatic lesion on most pulse sequences rendering today's study nondiagnostic. The only visualized hepatic lesion is compatible with a small simple hepatic cyst. ?2. There is abnormal signal intensity and small rim enhancing ?lesions in the right psoas muscle. Clinical correlation for signs ?and symptoms of psoas abscess is recommended. Alternatively, the possibility of intramuscular metastasis could be considered. ?3. Small bilateral pleural effusions lying dependently with areas of ?presumed passive atelectasis in the lower lobes of the lungs ?bilaterally. ?4. Multiple simple cysts in the kidneys bilaterally. ?5. Aortic atherosclerosis. ?  ?08/20/2021 Initial Diagnosis  ? Rectal cancer ([place]) ? ?  ?08/21/2021 Imaging  ? EXAM: ?MRI PELVIS WITHOUT CONTRAST ? ?IMPRESSION: ?Rectal adenocarcinoma T stage: T2 ?  ?Rectal adenocarcinoma N stage:  N0 ?  ?Distance from tumor to the internal anal sphincter is 4.5 cm. ?  ? ? ? ?INTERVAL HISTORY:  ?ERVIN ROTHBAUER is here for a follow up of rectal cancer. She was last seen by me on [facility] on 11/05/21. She presents to the clinic alone. ?  She reports she is doing well overall.  She notes occasional diarrhea. She endorses using baby wipes to keep her anal area moisturized. ?She reports continued b/l foot swelling. She denies pain with walking but notes tenderness to palpation. She reports the swelling will go down  in the morning, but does not resolve. ?  ?All other systems were reviewed with the patient and are negative. ? ?MEDICAL HISTORY:  ?Past Medical History:  ?Diagnosis Date  ? Anemia   ? Anxiety   ? Cataract

## 2021-11-13 ENCOUNTER — Ambulatory Visit
Admission: RE | Admit: 2021-11-13 | Discharge: 2021-11-13 | Disposition: A | Discharge status: Home / Self Care | Payer: Medicare HMO | Source: Ambulatory Visit | Attending: Radiation Oncology | Admitting: Radiation Oncology

## 2021-11-13 ENCOUNTER — Other Ambulatory Visit: Payer: Self-pay

## 2021-11-13 DIAGNOSIS — Z510 Encounter for antineoplastic radiation therapy: Secondary | ICD-10-CM | POA: Diagnosis not present

## 2021-11-13 DIAGNOSIS — Z79899 Other long term (current) drug therapy: Secondary | ICD-10-CM | POA: Diagnosis not present

## 2021-11-13 DIAGNOSIS — D500 Iron deficiency anemia secondary to blood loss (chronic): Secondary | ICD-10-CM | POA: Diagnosis not present

## 2021-11-13 DIAGNOSIS — D631 Anemia in chronic kidney disease: Secondary | ICD-10-CM | POA: Diagnosis not present

## 2021-11-13 DIAGNOSIS — R5383 Other fatigue: Secondary | ICD-10-CM | POA: Diagnosis not present

## 2021-11-13 DIAGNOSIS — N189 Chronic kidney disease, unspecified: Secondary | ICD-10-CM | POA: Diagnosis not present

## 2021-11-13 DIAGNOSIS — C20 Malignant neoplasm of rectum: Secondary | ICD-10-CM | POA: Diagnosis not present

## 2021-11-13 DIAGNOSIS — Z8616 Personal history of COVID-19: Secondary | ICD-10-CM | POA: Diagnosis not present

## 2021-11-13 DIAGNOSIS — R609 Edema, unspecified: Secondary | ICD-10-CM | POA: Diagnosis not present

## 2021-11-13 LAB — RAD ONC ARIA SESSION SUMMARY
Course Elapsed Days: 23
Plan Fractions Treated to Date: 17
Plan Prescribed Dose Per Fraction: 1.8 Gy
Plan Total Fractions Prescribed: 25
Plan Total Prescribed Dose: 45 Gy
Reference Point Dosage Given to Date: 30.6 Gy
Reference Point Session Dosage Given: 1.8 Gy
Session Number: 17

## 2021-11-14 ENCOUNTER — Ambulatory Visit
Admission: RE | Admit: 2021-11-14 | Discharge: 2021-11-14 | Disposition: A | Discharge status: Home / Self Care | Payer: Medicare HMO | Source: Ambulatory Visit | Attending: Radiation Oncology | Admitting: Radiation Oncology

## 2021-11-14 ENCOUNTER — Telehealth: Payer: Self-pay | Admitting: Dietician

## 2021-11-14 ENCOUNTER — Other Ambulatory Visit: Payer: Self-pay

## 2021-11-14 DIAGNOSIS — N189 Chronic kidney disease, unspecified: Secondary | ICD-10-CM | POA: Diagnosis not present

## 2021-11-14 DIAGNOSIS — R609 Edema, unspecified: Secondary | ICD-10-CM | POA: Diagnosis not present

## 2021-11-14 DIAGNOSIS — D631 Anemia in chronic kidney disease: Secondary | ICD-10-CM | POA: Diagnosis not present

## 2021-11-14 DIAGNOSIS — C20 Malignant neoplasm of rectum: Secondary | ICD-10-CM | POA: Diagnosis not present

## 2021-11-14 DIAGNOSIS — Z79899 Other long term (current) drug therapy: Secondary | ICD-10-CM | POA: Diagnosis not present

## 2021-11-14 DIAGNOSIS — Z510 Encounter for antineoplastic radiation therapy: Secondary | ICD-10-CM | POA: Diagnosis not present

## 2021-11-14 DIAGNOSIS — R5383 Other fatigue: Secondary | ICD-10-CM | POA: Diagnosis not present

## 2021-11-14 DIAGNOSIS — Z8616 Personal history of COVID-19: Secondary | ICD-10-CM | POA: Diagnosis not present

## 2021-11-14 DIAGNOSIS — D500 Iron deficiency anemia secondary to blood loss (chronic): Secondary | ICD-10-CM | POA: Diagnosis not present

## 2021-11-14 LAB — RAD ONC ARIA SESSION SUMMARY
Course Elapsed Days: 24
Plan Fractions Treated to Date: 18
Plan Prescribed Dose Per Fraction: 1.8 Gy
Plan Total Fractions Prescribed: 25
Plan Total Prescribed Dose: 45 Gy
Reference Point Dosage Given to Date: 32.4 Gy
Reference Point Session Dosage Given: 1.8 Gy
Session Number: 18

## 2021-11-14 NOTE — Telephone Encounter (Signed)
.  Called patient to schedule appointment per 5/4 inbasket, patient is aware of date and time.   ?

## 2021-11-15 ENCOUNTER — Other Ambulatory Visit: Payer: Self-pay

## 2021-11-15 ENCOUNTER — Ambulatory Visit
Admission: RE | Admit: 2021-11-15 | Discharge: 2021-11-15 | Disposition: A | Discharge status: Home / Self Care | Payer: Medicare HMO | Source: Ambulatory Visit | Attending: Radiation Oncology | Admitting: Radiation Oncology

## 2021-11-15 DIAGNOSIS — Z510 Encounter for antineoplastic radiation therapy: Secondary | ICD-10-CM | POA: Diagnosis not present

## 2021-11-15 DIAGNOSIS — D631 Anemia in chronic kidney disease: Secondary | ICD-10-CM | POA: Diagnosis not present

## 2021-11-15 DIAGNOSIS — D500 Iron deficiency anemia secondary to blood loss (chronic): Secondary | ICD-10-CM | POA: Diagnosis not present

## 2021-11-15 DIAGNOSIS — N189 Chronic kidney disease, unspecified: Secondary | ICD-10-CM | POA: Diagnosis not present

## 2021-11-15 DIAGNOSIS — C20 Malignant neoplasm of rectum: Secondary | ICD-10-CM | POA: Diagnosis not present

## 2021-11-15 DIAGNOSIS — Z8616 Personal history of COVID-19: Secondary | ICD-10-CM | POA: Diagnosis not present

## 2021-11-15 DIAGNOSIS — Z79899 Other long term (current) drug therapy: Secondary | ICD-10-CM | POA: Diagnosis not present

## 2021-11-15 DIAGNOSIS — R5383 Other fatigue: Secondary | ICD-10-CM | POA: Diagnosis not present

## 2021-11-15 DIAGNOSIS — R609 Edema, unspecified: Secondary | ICD-10-CM | POA: Diagnosis not present

## 2021-11-15 LAB — RAD ONC ARIA SESSION SUMMARY
Course Elapsed Days: 25
Plan Fractions Treated to Date: 19
Plan Prescribed Dose Per Fraction: 1.8 Gy
Plan Total Fractions Prescribed: 25
Plan Total Prescribed Dose: 45 Gy
Reference Point Dosage Given to Date: 34.2 Gy
Reference Point Session Dosage Given: 1.8 Gy
Session Number: 19

## 2021-11-18 ENCOUNTER — Other Ambulatory Visit: Payer: Self-pay

## 2021-11-18 ENCOUNTER — Ambulatory Visit
Admission: RE | Admit: 2021-11-18 | Discharge: 2021-11-18 | Disposition: A | Discharge status: Home / Self Care | Payer: Medicare HMO | Source: Ambulatory Visit | Attending: Radiation Oncology | Admitting: Radiation Oncology

## 2021-11-18 ENCOUNTER — Other Ambulatory Visit (HOSPITAL_COMMUNITY): Payer: Self-pay

## 2021-11-18 DIAGNOSIS — N189 Chronic kidney disease, unspecified: Secondary | ICD-10-CM | POA: Diagnosis not present

## 2021-11-18 DIAGNOSIS — C20 Malignant neoplasm of rectum: Secondary | ICD-10-CM | POA: Diagnosis not present

## 2021-11-18 DIAGNOSIS — D631 Anemia in chronic kidney disease: Secondary | ICD-10-CM | POA: Diagnosis not present

## 2021-11-18 DIAGNOSIS — D500 Iron deficiency anemia secondary to blood loss (chronic): Secondary | ICD-10-CM | POA: Diagnosis not present

## 2021-11-18 DIAGNOSIS — Z79899 Other long term (current) drug therapy: Secondary | ICD-10-CM | POA: Diagnosis not present

## 2021-11-18 DIAGNOSIS — Z510 Encounter for antineoplastic radiation therapy: Secondary | ICD-10-CM | POA: Diagnosis not present

## 2021-11-18 DIAGNOSIS — R5383 Other fatigue: Secondary | ICD-10-CM | POA: Diagnosis not present

## 2021-11-18 DIAGNOSIS — Z8616 Personal history of COVID-19: Secondary | ICD-10-CM | POA: Diagnosis not present

## 2021-11-18 DIAGNOSIS — R609 Edema, unspecified: Secondary | ICD-10-CM | POA: Diagnosis not present

## 2021-11-18 LAB — RAD ONC ARIA SESSION SUMMARY
Course Elapsed Days: 28
Plan Fractions Treated to Date: 20
Plan Prescribed Dose Per Fraction: 1.8 Gy
Plan Total Fractions Prescribed: 25
Plan Total Prescribed Dose: 45 Gy
Reference Point Dosage Given to Date: 36 Gy
Reference Point Session Dosage Given: 1.8 Gy
Session Number: 20

## 2021-11-19 ENCOUNTER — Ambulatory Visit
Admission: RE | Admit: 2021-11-19 | Discharge: 2021-11-19 | Disposition: A | Discharge status: Home / Self Care | Payer: Medicare HMO | Source: Ambulatory Visit | Attending: Radiation Oncology | Admitting: Radiation Oncology

## 2021-11-19 ENCOUNTER — Other Ambulatory Visit: Payer: Self-pay

## 2021-11-19 DIAGNOSIS — C20 Malignant neoplasm of rectum: Secondary | ICD-10-CM | POA: Diagnosis not present

## 2021-11-19 DIAGNOSIS — Z79899 Other long term (current) drug therapy: Secondary | ICD-10-CM | POA: Diagnosis not present

## 2021-11-19 DIAGNOSIS — R609 Edema, unspecified: Secondary | ICD-10-CM | POA: Diagnosis not present

## 2021-11-19 DIAGNOSIS — N189 Chronic kidney disease, unspecified: Secondary | ICD-10-CM | POA: Diagnosis not present

## 2021-11-19 DIAGNOSIS — Z8616 Personal history of COVID-19: Secondary | ICD-10-CM | POA: Diagnosis not present

## 2021-11-19 DIAGNOSIS — R5383 Other fatigue: Secondary | ICD-10-CM | POA: Diagnosis not present

## 2021-11-19 DIAGNOSIS — Z510 Encounter for antineoplastic radiation therapy: Secondary | ICD-10-CM | POA: Diagnosis not present

## 2021-11-19 DIAGNOSIS — D500 Iron deficiency anemia secondary to blood loss (chronic): Secondary | ICD-10-CM | POA: Diagnosis not present

## 2021-11-19 DIAGNOSIS — D631 Anemia in chronic kidney disease: Secondary | ICD-10-CM | POA: Diagnosis not present

## 2021-11-19 LAB — RAD ONC ARIA SESSION SUMMARY
Course Elapsed Days: 29
Plan Fractions Treated to Date: 21
Plan Prescribed Dose Per Fraction: 1.8 Gy
Plan Total Fractions Prescribed: 25
Plan Total Prescribed Dose: 45 Gy
Reference Point Dosage Given to Date: 37.8 Gy
Reference Point Session Dosage Given: 1.8 Gy
Session Number: 21

## 2021-11-20 ENCOUNTER — Ambulatory Visit
Admission: RE | Admit: 2021-11-20 | Discharge: 2021-11-20 | Disposition: A | Discharge status: Home / Self Care | Payer: Medicare HMO | Source: Ambulatory Visit | Attending: Radiation Oncology | Admitting: Radiation Oncology

## 2021-11-20 ENCOUNTER — Other Ambulatory Visit (HOSPITAL_COMMUNITY): Payer: Self-pay

## 2021-11-20 ENCOUNTER — Inpatient Hospital Stay (HOSPITAL_BASED_OUTPATIENT_CLINIC_OR_DEPARTMENT_OTHER): Payer: Medicare HMO | Admitting: Hematology

## 2021-11-20 ENCOUNTER — Other Ambulatory Visit: Payer: Self-pay

## 2021-11-20 ENCOUNTER — Inpatient Hospital Stay: Payer: Medicare HMO

## 2021-11-20 ENCOUNTER — Encounter: Payer: Self-pay | Admitting: Hematology

## 2021-11-20 VITALS — BP 165/71 | HR 74 | Temp 98.0°F | Resp 18 | Ht 69.0 in | Wt 118.3 lb

## 2021-11-20 DIAGNOSIS — C20 Malignant neoplasm of rectum: Secondary | ICD-10-CM

## 2021-11-20 DIAGNOSIS — R609 Edema, unspecified: Secondary | ICD-10-CM | POA: Diagnosis not present

## 2021-11-20 DIAGNOSIS — D500 Iron deficiency anemia secondary to blood loss (chronic): Secondary | ICD-10-CM | POA: Diagnosis not present

## 2021-11-20 DIAGNOSIS — K922 Gastrointestinal hemorrhage, unspecified: Secondary | ICD-10-CM

## 2021-11-20 DIAGNOSIS — D631 Anemia in chronic kidney disease: Secondary | ICD-10-CM | POA: Diagnosis not present

## 2021-11-20 DIAGNOSIS — N189 Chronic kidney disease, unspecified: Secondary | ICD-10-CM | POA: Diagnosis not present

## 2021-11-20 DIAGNOSIS — Z510 Encounter for antineoplastic radiation therapy: Secondary | ICD-10-CM | POA: Diagnosis not present

## 2021-11-20 DIAGNOSIS — R5383 Other fatigue: Secondary | ICD-10-CM | POA: Diagnosis not present

## 2021-11-20 DIAGNOSIS — Z79899 Other long term (current) drug therapy: Secondary | ICD-10-CM | POA: Diagnosis not present

## 2021-11-20 DIAGNOSIS — Z8616 Personal history of COVID-19: Secondary | ICD-10-CM | POA: Diagnosis not present

## 2021-11-20 LAB — RAD ONC ARIA SESSION SUMMARY
Course Elapsed Days: 30
Plan Fractions Treated to Date: 22
Plan Prescribed Dose Per Fraction: 1.8 Gy
Plan Total Fractions Prescribed: 25
Plan Total Prescribed Dose: 45 Gy
Reference Point Dosage Given to Date: 39.6 Gy
Reference Point Session Dosage Given: 1.8 Gy
Session Number: 22

## 2021-11-20 LAB — CBC WITH DIFFERENTIAL (CANCER CENTER ONLY)
Abs Immature Granulocytes: 0.02 10*3/uL (ref 0.00–0.07)
Basophils Absolute: 0 10*3/uL (ref 0.0–0.1)
Basophils Relative: 0 %
Eosinophils Absolute: 0.3 10*3/uL (ref 0.0–0.5)
Eosinophils Relative: 6 %
HCT: 28.2 % — ABNORMAL LOW (ref 36.0–46.0)
Hemoglobin: 9.3 g/dL — ABNORMAL LOW (ref 12.0–15.0)
Immature Granulocytes: 0 %
Lymphocytes Relative: 12 %
Lymphs Abs: 0.6 10*3/uL — ABNORMAL LOW (ref 0.7–4.0)
MCH: 32.7 pg (ref 26.0–34.0)
MCHC: 33 g/dL (ref 30.0–36.0)
MCV: 99.3 fL (ref 80.0–100.0)
Monocytes Absolute: 0.7 10*3/uL (ref 0.1–1.0)
Monocytes Relative: 15 %
Neutro Abs: 3.2 10*3/uL (ref 1.7–7.7)
Neutrophils Relative %: 67 %
Platelet Count: 223 10*3/uL (ref 150–400)
RBC: 2.84 MIL/uL — ABNORMAL LOW (ref 3.87–5.11)
RDW: 18.1 % — ABNORMAL HIGH (ref 11.5–15.5)
WBC Count: 4.9 10*3/uL (ref 4.0–10.5)
nRBC: 0 % (ref 0.0–0.2)

## 2021-11-20 LAB — CMP (CANCER CENTER ONLY)
ALT: 7 U/L (ref 0–44)
AST: 12 U/L — ABNORMAL LOW (ref 15–41)
Albumin: 3.3 g/dL — ABNORMAL LOW (ref 3.5–5.0)
Alkaline Phosphatase: 85 U/L (ref 38–126)
Anion gap: 9 (ref 5–15)
BUN: 10 mg/dL (ref 8–23)
CO2: 22 mmol/L (ref 22–32)
Calcium: 8.6 mg/dL — ABNORMAL LOW (ref 8.9–10.3)
Chloride: 112 mmol/L — ABNORMAL HIGH (ref 98–111)
Creatinine: 1.19 mg/dL — ABNORMAL HIGH (ref 0.44–1.00)
GFR, Estimated: 47 mL/min — ABNORMAL LOW
Glucose, Bld: 95 mg/dL (ref 70–99)
Potassium: 4 mmol/L (ref 3.5–5.1)
Sodium: 143 mmol/L (ref 135–145)
Total Bilirubin: 0.6 mg/dL (ref 0.3–1.2)
Total Protein: 6.4 g/dL — ABNORMAL LOW (ref 6.5–8.1)

## 2021-11-20 LAB — IRON AND IRON BINDING CAPACITY (CC-WL,HP ONLY)
Iron: 105 ug/dL (ref 28–170)
Saturation Ratios: 51 % — ABNORMAL HIGH (ref 10.4–31.8)
TIBC: 204 ug/dL — ABNORMAL LOW (ref 250–450)
UIBC: 99 ug/dL — ABNORMAL LOW (ref 148–442)

## 2021-11-20 LAB — FERRITIN: Ferritin: 111 ng/mL (ref 11–307)

## 2021-11-20 NOTE — Progress Notes (Addendum)
?[place]   ?[phone] [phone]   ?Clinic Follow up Note  ? ?Patient Care Team: ?[name], MD as PCP - General (Internal Medicine) ?Truitt Merle, MD as Consulting Physician (Hematology) ? ?Date of Service:  11/20/2021 ? ?CHIEF COMPLAINT: f/u of rectal cancer ? ?CURRENT THERAPY:  ?Concurrent chemoRT with Xeloda, 10/21/21 - 11/28/21  ? -Xeloda dose: 101m BID, M-F ? ?ASSESSMENT & PLAN:  ?Tina KOZLOVis a 81y.o. female with  ? ?1. Rectal Cancer, stage I c(T2, N0)M0 ?-presented to ED on 08/10/21 with weakness and black stool. Colonoscopy and EGD on 08/15/21 by Dr. MRush Landmarkshowed a palpable, partially obstructing rectal mass. Path confirmed fragments of adenocarcinoma with multifocal high-grade dysplasia and focally suspicious for intramucosal carcinoma. ?-CT CAP on 08/16/21 showed: two tiny nonspecific liver lesions; 9 mm hypodense pancreatic body lesion. MRCP on 08/18/21 showed hepatic lesion compatible with simple hepatic cyst, within limitation of beam hardening artifact, and small lesions in right psoas muscle. ?-baseline CEA on 08/16/21 was normal at 3.7. ?-staging pelvis MRI on 08/21/21 showed stage T2 N0 rectal cancer. ?-she met Dr. TMarcello Mooreson 09/23/21. Pt declines surgery due to concern for side effects/quality of life as she lives alone. ?-started concurrent radiation with Xeloda (1000 mg BID) first dose 10/21/21. She is tolerating well with no noticeable side effects. Her skin remains intact and not painful (only itchy) so far. ?-Labs reviewed, overall stable. We will continue to monitor. ?  ?2. Bilateral foot edema ?-present prior to cancer treatment. ?-she is on lasix per her PCP for HTN. ?-today she denies pain but notes continued tenderness to palpation. ?-doppler on 11/12/21 showed no right DVT, subcutaneous edema noted in popliteal fossa and calf, and enlarged lymph nodes noted in groin. ?  ?3. Weakness and fatigue, COVID+ 08/10/21 ?-she is now unable to walk  independently, uses walker. ?-she endorses doing home PT once a week ?  ?4. Anemia ?-secondary to GI blood loss and CKD ?-Hgb stable at 9.3 today (11/20/21) ?-currently on iron pills.  ?-iron panel from last week shows no evidence of deficiency, results from today are pending. ?-continue to monitor.  ?  ?5. Social Support ?-she previously lived independently and did not require assistance. ?-she is currently in an apartment and has support from some of her neighbors. ?-she [place] other week, but no other nearby friends or relatives. ?  ?6. Hypertension ?-managed by PCP ?-Complaint with her norvac and metoprolol. She also takes lasix. ?-pt previously kept a BP diary, which showed some elevated readings, indicating she also has some white coat syndrome. ?-BP improved to 165/71 today (11/20/21) ?  ?  ?PLAN: ?-continue Xeloda ?-continue daily radiation therapy   ?-lab and f/u weekly while on radiation ? ? ?No problem-specific Assessment & Plan notes found for this encounter. ? ? ?SUMMARY OF ONCOLOGIC HISTORY: ?Oncology History  ?[place]  ?08/15/2021 Cancer Staging  ? Staging form: Colon and Rectum, AJCC 8th Edition ?- Clinical stage from 08/15/2021: Stage I (cT2, cN0, cM0) - Signed by FTruitt Merle MD on 09/05/2021 ?Stage prefix: Initial diagnosis ?Total positive nodes: 0 ? ?  ?08/15/2021 Procedure  ? Colonoscopy, Dr. MRush Landmark? ?Impression: ?- Palpable rectal mass found on digital rectal exam. ?- The examined portion of the ileum was normal. ?- Four 3 to 7 mm polyps at the recto-sigmoid colon, in the descending colon and in the ascending colon, removed with a cold snare. Resected  and retrieved. ?- Rule out malignancy, partially obstructing tumor in the proximal/middle rectum. Biopsied. Tattooed proximal to lesion. ?- Diverticulosis in the entire examined colon. ?- Normal mucosa in the entire examined colon otherwise. ?- Non-bleeding non-thrombosed external and internal  hemorrhoids. ?  ?08/15/2021 Initial Biopsy  ? FINAL MICROSCOPIC DIAGNOSIS:  ? ?A. STOMACH, BIOPSY:  ?- Gastric antral and oxyntic mucosa with nonspecific reactive  ?gastropathy  ?- Helicobacter pylori-like organisms are not identified on routine HE stain  ? ?B. DUODENUM, BIOPSY:  ?- Duodenal mucosa with no specific histopathologic changes  ?- Negative for increased intraepithelial lymphocytes or villous  ?architectural changes  ? ?C. COLON, ASCENDING, DESCENDING AND SIGMOID, POLYPECTOMY:  ?- Tubular adenoma(s)  ?- Negative for high-grade dysplasia or malignancy  ? ?D. RECTAL MASS, BIOPSY:  ?- Fragments of an adenoma with multifocal high-grade dysplasia, focally suspicious for intramucosal carcinoma.  See comment  ? ? ?COMMENT:  ?D.  A deeper, more severe process cannot be ruled out.  Clinical and endoscopic correlation is suggested.  ?  ?08/16/2021 Imaging  ? EXAM: ?CT CHEST, ABDOMEN, AND PELVIS WITH CONTRAST ? ?IMPRESSION: ?1. Large rectal mass. Two tiny hypodense lesions in the liver are ?nonspecific with regard to benign etiology or metastatic disease. ?2. 9 mm in long axis hypodense pancreatic body lesion. Possibilities include postinflammatory cyst, intraductal papillary mucinous neoplasm, or less likely pancreatic adenocarcinoma. ?3. Bilateral renal cysts. A complex right kidney upper pole lesion ?is indeterminate for complex cyst versus mass and measures 9 mm in diameter. ?4. In light of these findings, consider upper abdominal MRI with ?contrast to further characterize the hepatic, pancreatic, and renal ?lesions. ?5. There is also unusual left perirenal stranding especially ?adjacent to a cyst along the left kidney lower pole, this could be ?an indicator of inflammation involving the left kidney but is ?technically nonspecific. Some of this edema tracks along the left ?paracolic gutter. ?6. Advanced systemic atherosclerosis. This includes aortic and ?coronary atherosclerosis along with mesenteric and  renal ?atherosclerosis. If further workup of the patient's severe ?atherosclerotic vascular disease is warranted, CT angiography would be recommended. ?7. Small bilateral pleural effusions with passive atelectasis. ?8. Severe centrilobular emphysema.  Emphysema (ICD10-J43.9). ?9. Mild thoracolumbar scoliosis. ?10. Scattered sigmoid colon diverticula. ?  ?08/18/2021 Imaging  ? EXAM: ?MRI ABDOMEN WITHOUT AND WITH CONTRAST (INCLUDING MRCP) ? ?IMPRESSION: ?1. Study is severely limited by beam hardening artifact from a large ?surgical clip in the stomach, which completely obscures the ?previously noted pancreatic lesion on most pulse sequences rendering today's study nondiagnostic. The only visualized hepatic lesion is compatible with a small simple hepatic cyst. ?2. There is abnormal signal intensity and small rim enhancing ?lesions in the right psoas muscle. Clinical correlation for signs ?and symptoms of psoas abscess is recommended. Alternatively, the possibility of intramuscular metastasis could be considered. ?3. Small bilateral pleural effusions lying dependently with areas of ?presumed passive atelectasis in the lower lobes of the lungs ?bilaterally. ?4. Multiple simple cysts in the kidneys bilaterally. ?5. Aortic atherosclerosis. ?  ?08/20/2021 Initial Diagnosis  ? Rectal cancer ([place]) ? ?  ?08/21/2021 Imaging  ? EXAM: ?MRI PELVIS WITHOUT CONTRAST ? ?IMPRESSION: ?Rectal adenocarcinoma T stage: T2 ?  ?Rectal adenocarcinoma N stage:  N0 ?  ?Distance from tumor to the internal anal sphincter is 4.5 cm. ?  ? ? ? ?INTERVAL HISTORY:  ?Tina Waller is here for a follow up of rectal cancer. She was last seen by me on 11/12/21. She presents to the clinic alone. ?She reports  she is doing well except for diarrhea from the potassium. She denies any rectal pain but notes itching. ?  ?All other systems were reviewed with the patient and are negative. ? ?MEDICAL HISTORY:  ?Past Medical History:  ?Diagnosis Date  ? Anemia   ?  Anxiety   ? Cataract   ? Depression   ? Diabetes mellitus without complication ([place])   ? pt denies  ? GERD (gastroesophageal reflux disease)   ? Gout   ? Headache   ? Hypertension   ? Stroke [facility]) 2008  ? 09/14/2018  ? Tonsillar mass

## 2021-11-21 ENCOUNTER — Other Ambulatory Visit: Payer: Self-pay

## 2021-11-21 ENCOUNTER — Ambulatory Visit
Admission: RE | Admit: 2021-11-21 | Discharge: 2021-11-21 | Disposition: A | Discharge status: Home / Self Care | Payer: Medicare HMO | Source: Ambulatory Visit | Attending: Radiation Oncology | Admitting: Radiation Oncology

## 2021-11-21 ENCOUNTER — Inpatient Hospital Stay: Payer: Medicare HMO | Admitting: Dietician

## 2021-11-21 DIAGNOSIS — N189 Chronic kidney disease, unspecified: Secondary | ICD-10-CM | POA: Diagnosis not present

## 2021-11-21 DIAGNOSIS — C20 Malignant neoplasm of rectum: Secondary | ICD-10-CM | POA: Diagnosis not present

## 2021-11-21 DIAGNOSIS — R609 Edema, unspecified: Secondary | ICD-10-CM | POA: Diagnosis not present

## 2021-11-21 DIAGNOSIS — D631 Anemia in chronic kidney disease: Secondary | ICD-10-CM | POA: Diagnosis not present

## 2021-11-21 DIAGNOSIS — Z79899 Other long term (current) drug therapy: Secondary | ICD-10-CM | POA: Diagnosis not present

## 2021-11-21 DIAGNOSIS — R5383 Other fatigue: Secondary | ICD-10-CM | POA: Diagnosis not present

## 2021-11-21 DIAGNOSIS — Z510 Encounter for antineoplastic radiation therapy: Secondary | ICD-10-CM | POA: Diagnosis not present

## 2021-11-21 DIAGNOSIS — Z8616 Personal history of COVID-19: Secondary | ICD-10-CM | POA: Diagnosis not present

## 2021-11-21 DIAGNOSIS — D500 Iron deficiency anemia secondary to blood loss (chronic): Secondary | ICD-10-CM | POA: Diagnosis not present

## 2021-11-21 LAB — RAD ONC ARIA SESSION SUMMARY
Course Elapsed Days: 31
Plan Fractions Treated to Date: 23
Plan Prescribed Dose Per Fraction: 1.8 Gy
Plan Total Fractions Prescribed: 25
Plan Total Prescribed Dose: 45 Gy
Reference Point Dosage Given to Date: 41.4 Gy
Reference Point Session Dosage Given: 1.8 Gy
Session Number: 23

## 2021-11-21 NOTE — Progress Notes (Signed)
Nutrition Assessment ? ? ?Reason for Assessment: Provider referral  ? ? ?ASSESSMENT: 81 year old female with rectal cancer. She is receiving concurrent chemoradiation with Xeloda (4/10-5/18). Patient is under the care of Dr. Burr Medico. ? ?Past medical history includes Covid (07/31/21) with residual weakness currently undergoing PT, anemia, anxiety, depression, GERD, gout, HTN, stroke, vertigo, wears upper dentures. ? ?Met with patient in office prior to radiation. She reports appetite has "been terrible" the last 3 weeks. Patient eating twice daily. She typically eats bacon, toast and drinks a cup of tea around 10 AM. Patient is planning to eat baked chicken, mashed potatoes and a vegetable for supper around 7 PM. She reports previously being a heavy snacker throughout the day. Patient reports intermittent episodes of diarrhea, 2-3 times day. She has found yogurt, pintos, collards, sometimes grilled cheese trigger episodes. Patient reports trying to drink Ensure per MD recommendation but these go right through her. Patient reports ongoing weakness from Covid infection in January. She continues working with physical therapy.  ? ? ?Nutrition Focused Physical Exam:  ? ?Orbital Region: severe ?Buccal Region: severe ?Upper Arm Region: severe  ?Thoracic and Lumbar Region: severe ?Temple Region: moderate ?Clavicle Bone Region: severe ?Shoulder and Acromion Bone Region: severe ?Scapular Bone Region: severe  ?Dorsal Hand: moderate ?Patellar Region: unable to assess ?Anterior Thigh Region: unable to assess ?Posterior Calf Region: unable to assess due to bilateral edema, reports tenderness with touch (MD aware, s/p doppler on 5/2 - no DVT) ?Edema (RD assessment): +1 bilateral lower extremity  ?Hair: reviewed ?Eyes: reviewed ?Mouth: reviewed ?Skin: reviewed ?Nails: reviewed  ? ? ?Medications: klor-con, zofran, fergon, MVI, lasix, plavix, imdur, D3, B12, miralax ? ? ?Labs: 5/10- Cr 1.19 ? ? ?Anthropometrics: Weights have decreased  5.6% from 125 lb 1.6 oz on 4/10. She is ~16% under usual weight; this is significant  ? ?Height: 5'9" ?Weight: 118 lb 4.8 oz (5/10) ?UBW: 135-140 lb (2022) ?BMI: 17.47 ? ? ? ?NUTRITION DIAGNOSIS: Patient meets criteria for severe malnutrition in the context of chronic disease related to anal cancer and associated treatments as evidenced by reported diarrhea, poor appetite, severe fat depletion, moderate/severe muscle depletion, 5.6% weight loss in the last month which is significant for time frame  ? ? ?INTERVENTION:  ?Educated on strategies for increasing calories and protein with small frequent meals and snacks - handout with ideas provided ?Discussed foods with protein, encouraged source of protein with meals/snacks ?Educated on strategies for diarrhea - handout with tips as well as samples of banatrol provided ?Suggested drinking half of Ensure at a time or cutting with milk to improve toleration as well as offered CIB as alternate oral supplement option - samples of Ensure Plus and CIB powder provided ?Contact information given  ? ? ?MONITORING, EVALUATION, GOAL: Patient will tolerate increased calories and protein to promote weight gain  ? ? ?Next Visit: Wednesday May 17 prior to radiation (pt aware) ? ? ? ? ? ? ?

## 2021-11-22 ENCOUNTER — Ambulatory Visit
Admission: RE | Admit: 2021-11-22 | Discharge: 2021-11-22 | Disposition: A | Discharge status: Home / Self Care | Payer: Medicare HMO | Source: Ambulatory Visit | Attending: Radiation Oncology | Admitting: Radiation Oncology

## 2021-11-22 ENCOUNTER — Other Ambulatory Visit (HOSPITAL_COMMUNITY): Payer: Self-pay

## 2021-11-22 ENCOUNTER — Other Ambulatory Visit: Payer: Self-pay

## 2021-11-22 DIAGNOSIS — Z79899 Other long term (current) drug therapy: Secondary | ICD-10-CM | POA: Diagnosis not present

## 2021-11-22 DIAGNOSIS — Z510 Encounter for antineoplastic radiation therapy: Secondary | ICD-10-CM | POA: Diagnosis not present

## 2021-11-22 DIAGNOSIS — N189 Chronic kidney disease, unspecified: Secondary | ICD-10-CM | POA: Diagnosis not present

## 2021-11-22 DIAGNOSIS — D500 Iron deficiency anemia secondary to blood loss (chronic): Secondary | ICD-10-CM | POA: Diagnosis not present

## 2021-11-22 DIAGNOSIS — R5383 Other fatigue: Secondary | ICD-10-CM | POA: Diagnosis not present

## 2021-11-22 DIAGNOSIS — C20 Malignant neoplasm of rectum: Secondary | ICD-10-CM | POA: Diagnosis not present

## 2021-11-22 DIAGNOSIS — R609 Edema, unspecified: Secondary | ICD-10-CM | POA: Diagnosis not present

## 2021-11-22 DIAGNOSIS — D631 Anemia in chronic kidney disease: Secondary | ICD-10-CM | POA: Diagnosis not present

## 2021-11-22 DIAGNOSIS — Z8616 Personal history of COVID-19: Secondary | ICD-10-CM | POA: Diagnosis not present

## 2021-11-22 LAB — RAD ONC ARIA SESSION SUMMARY
Course Elapsed Days: 32
Plan Fractions Treated to Date: 24
Plan Prescribed Dose Per Fraction: 1.8 Gy
Plan Total Fractions Prescribed: 25
Plan Total Prescribed Dose: 45 Gy
Reference Point Dosage Given to Date: 43.2 Gy
Reference Point Session Dosage Given: 1.8 Gy
Session Number: 24

## 2021-11-25 ENCOUNTER — Inpatient Hospital Stay: Payer: Medicare HMO | Admitting: Hematology

## 2021-11-25 ENCOUNTER — Ambulatory Visit
Admission: RE | Admit: 2021-11-25 | Discharge: 2021-11-25 | Disposition: A | Discharge status: Home / Self Care | Payer: Medicare HMO | Source: Ambulatory Visit | Attending: Radiation Oncology | Admitting: Radiation Oncology

## 2021-11-25 ENCOUNTER — Other Ambulatory Visit: Payer: Self-pay

## 2021-11-25 ENCOUNTER — Inpatient Hospital Stay (HOSPITAL_BASED_OUTPATIENT_CLINIC_OR_DEPARTMENT_OTHER): Payer: Medicare HMO

## 2021-11-25 VITALS — BP 179/90 | HR 74 | Temp 97.8°F | Resp 18 | Ht 69.0 in | Wt 117.9 lb

## 2021-11-25 DIAGNOSIS — R5383 Other fatigue: Secondary | ICD-10-CM | POA: Diagnosis not present

## 2021-11-25 DIAGNOSIS — C20 Malignant neoplasm of rectum: Secondary | ICD-10-CM | POA: Diagnosis not present

## 2021-11-25 DIAGNOSIS — Z510 Encounter for antineoplastic radiation therapy: Secondary | ICD-10-CM | POA: Diagnosis not present

## 2021-11-25 DIAGNOSIS — K922 Gastrointestinal hemorrhage, unspecified: Secondary | ICD-10-CM

## 2021-11-25 DIAGNOSIS — R609 Edema, unspecified: Secondary | ICD-10-CM | POA: Diagnosis not present

## 2021-11-25 DIAGNOSIS — D631 Anemia in chronic kidney disease: Secondary | ICD-10-CM | POA: Diagnosis not present

## 2021-11-25 DIAGNOSIS — D500 Iron deficiency anemia secondary to blood loss (chronic): Secondary | ICD-10-CM | POA: Diagnosis not present

## 2021-11-25 DIAGNOSIS — Z8616 Personal history of COVID-19: Secondary | ICD-10-CM | POA: Diagnosis not present

## 2021-11-25 DIAGNOSIS — Z79899 Other long term (current) drug therapy: Secondary | ICD-10-CM | POA: Diagnosis not present

## 2021-11-25 DIAGNOSIS — N189 Chronic kidney disease, unspecified: Secondary | ICD-10-CM | POA: Diagnosis not present

## 2021-11-25 LAB — RAD ONC ARIA SESSION SUMMARY
Course Elapsed Days: 35
Plan Fractions Treated to Date: 25
Plan Prescribed Dose Per Fraction: 1.8 Gy
Plan Total Fractions Prescribed: 25
Plan Total Prescribed Dose: 45 Gy
Reference Point Dosage Given to Date: 45 Gy
Reference Point Session Dosage Given: 1.8 Gy
Session Number: 25

## 2021-11-25 LAB — CMP (CANCER CENTER ONLY)
ALT: 7 U/L (ref 0–44)
AST: 13 U/L — ABNORMAL LOW (ref 15–41)
Albumin: 3.3 g/dL — ABNORMAL LOW (ref 3.5–5.0)
Alkaline Phosphatase: 74 U/L (ref 38–126)
Anion gap: 7 (ref 5–15)
BUN: 11 mg/dL (ref 8–23)
CO2: 24 mmol/L (ref 22–32)
Calcium: 8.4 mg/dL — ABNORMAL LOW (ref 8.9–10.3)
Chloride: 109 mmol/L (ref 98–111)
Creatinine: 1.2 mg/dL — ABNORMAL HIGH (ref 0.44–1.00)
GFR, Estimated: 46 mL/min — ABNORMAL LOW (ref >60)
Glucose, Bld: 94 mg/dL (ref 70–99)
Potassium: 3.3 mmol/L — ABNORMAL LOW (ref 3.5–5.1)
Sodium: 140 mmol/L (ref 135–145)
Total Bilirubin: 0.5 mg/dL (ref 0.3–1.2)
Total Protein: 6.2 g/dL — ABNORMAL LOW (ref 6.5–8.1)

## 2021-11-25 LAB — CBC WITH DIFFERENTIAL (CANCER CENTER ONLY)
Abs Immature Granulocytes: 0.03 10*3/uL (ref 0.00–0.07)
Basophils Absolute: 0 10*3/uL (ref 0.0–0.1)
Basophils Relative: 0 %
Eosinophils Absolute: 0.3 10*3/uL (ref 0.0–0.5)
Eosinophils Relative: 5 %
HCT: 28.2 % — ABNORMAL LOW (ref 36.0–46.0)
Hemoglobin: 9.6 g/dL — ABNORMAL LOW (ref 12.0–15.0)
Immature Granulocytes: 1 %
Lymphocytes Relative: 7 %
Lymphs Abs: 0.4 10*3/uL — ABNORMAL LOW (ref 0.7–4.0)
MCH: 33.7 pg (ref 26.0–34.0)
MCHC: 34 g/dL (ref 30.0–36.0)
MCV: 98.9 fL (ref 80.0–100.0)
Monocytes Absolute: 0.6 10*3/uL (ref 0.1–1.0)
Monocytes Relative: 11 %
Neutro Abs: 4.1 10*3/uL (ref 1.7–7.7)
Neutrophils Relative %: 76 %
Platelet Count: 248 10*3/uL (ref 150–400)
RBC: 2.85 MIL/uL — ABNORMAL LOW (ref 3.87–5.11)
RDW: 18.3 % — ABNORMAL HIGH (ref 11.5–15.5)
WBC Count: 5.4 10*3/uL (ref 4.0–10.5)
nRBC: 0 % (ref 0.0–0.2)

## 2021-11-25 LAB — IRON AND IRON BINDING CAPACITY (CC-WL,HP ONLY)
Iron: 98 ug/dL (ref 28–170)
Saturation Ratios: 48 % — ABNORMAL HIGH (ref 10.4–31.8)
TIBC: 204 ug/dL — ABNORMAL LOW (ref 250–450)
UIBC: 106 ug/dL — ABNORMAL LOW (ref 148–442)

## 2021-11-25 LAB — CEA (IN HOUSE-CHCC): CEA (CHCC-In House): 2.72 ng/mL (ref 0.00–5.00)

## 2021-11-25 LAB — FERRITIN: Ferritin: 136 ng/mL (ref 11–307)

## 2021-11-25 NOTE — Progress Notes (Signed)
?[place]   ?[phone] [phone]   ?Clinic Follow up Note  ? ?Patient Care Team: ?[name], MD as PCP - General (Internal Medicine) ?Truitt Merle, MD as Consulting Physician (Hematology) ? ?Date of Service:  11/25/2021 ? ?CHIEF COMPLAINT: f/u of rectal cancer ? ?CURRENT THERAPY:  ?Concurrent chemoRT with Xeloda, 10/21/21 - 11/28/21  ?            -Xeloda dose: 1078m BID, M-F ? ?ASSESSMENT & PLAN:  ?Tina NADINGis a 81y.o. female with  ? ?1. Rectal Cancer, stage I c(T2, N0)M0 ?-presented to ED on 08/10/21 with weakness and black stool. Colonoscopy and EGD on 08/15/21 by Dr. MRush Landmarkshowed a palpable, partially obstructing rectal mass. Path confirmed fragments of adenocarcinoma with multifocal high-grade dysplasia and focally suspicious for intramucosal carcinoma. ?-CT CAP on 08/16/21 showed: two tiny nonspecific liver lesions; 9 mm hypodense pancreatic body lesion. MRCP on 08/18/21 showed hepatic lesion compatible with simple hepatic cyst, within limitation of beam hardening artifact, and small lesions in right psoas muscle. ?-baseline CEA on 08/16/21 was normal at 3.7. ?-staging pelvis MRI on 08/21/21 showed stage T2 N0 rectal cancer. ?-she met Dr. TMarcello Mooreson 09/23/21. Pt declines surgery due to concern for side effects/quality of life as she lives alone. ?-started concurrent radiation with Xeloda (1000 mg BID) first dose 10/21/21. She is tolerating well with no noticeable side effects. Her skin remains intact and not painful (only itchy) so far. ?-Labs reviewed, overall stable. Aside from some weight loss, she tolerates treatment well. We will continue to monitor. ?-I discussed follow up after she completes treatment. She will alternate f/u with Dr. TMarcello Mooresfor anoscope and with me for rectal exam. Plan to repeat MRI in 3 month to evaluate her response to CCRT. We discussed that surgery will be the only option to cure if she has residual disease after chemoradiation. ?  ?2.  Bilateral foot edema ?-present prior to cancer treatment. ?-she is on lasix per her PCP for HTN. ?-today she denies pain but notes continued tenderness to palpation. ?-doppler on 11/12/21 showed no right DVT, subcutaneous edema noted in popliteal fossa and calf, and enlarged lymph nodes noted in groin. ?  ?3. Weakness and fatigue, COVID+ 08/10/21 ?-she is now unable to walk independently, uses walker. ?-she endorses doing home PT once a week ?  ?4. Anemia ?-secondary to GI blood loss and CKD ?-Hgb stable at 9.6 today (11/25/21) ?-currently on iron pills.  ?-iron panel from last week shows no evidence of deficiency, results from today are pending. ?-continue to monitor.  ?  ?5. Social Support ?-she previously lived independently and did not require assistance. ?-she is currently in an apartment and has support from some of her neighbors. ?-[place] in [place] other week, but no other nearby friends or relatives. ?  ?6. Hypertension ?-managed by PCP ?-Complaint with her norvac and metoprolol. She also takes lasix. ?-pt previously kept a BP diary, which showed some elevated readings, indicating she also has some white coat syndrome. ?-BP up to 179/90 today (11/25/21); she notes she has not taken her medicine yet. ?  ?  ?PLAN: ?-continue Xeloda until 5/18 ?-continue daily radiation therapy  until 5/18 ?-lab and f/u in 5-6 weeks ?-will send her back to see Dr. TMarcello Mooresfor f/u  ? ? ?No problem-specific Assessment & Plan notes found for this encounter. ? ? ?SUMMARY OF ONCOLOGIC HISTORY: ?Oncology History  ?[place]  ?  08/15/2021 Cancer Staging  ? Staging form: Colon and Rectum, AJCC 8th Edition ?- Clinical stage from 08/15/2021: Stage I (cT2, cN0, cM0) - Signed by Truitt Merle, MD on 09/05/2021 ?Stage prefix: Initial diagnosis ?Total positive nodes: 0 ? ?  ?08/15/2021 Procedure  ? Colonoscopy, Dr. Rush Landmark ? ?Impression: ?- Palpable rectal mass found on digital rectal exam. ?- The examined portion of  the ileum was normal. ?- Four 3 to 7 mm polyps at the recto-sigmoid colon, in the descending colon and in the ascending colon, removed with a cold snare. Resected and retrieved. ?- Rule out malignancy, partially obstructing tumor in the proximal/middle rectum. Biopsied. Tattooed proximal to lesion. ?- Diverticulosis in the entire examined colon. ?- Normal mucosa in the entire examined colon otherwise. ?- Non-bleeding non-thrombosed external and internal hemorrhoids. ?  ?08/15/2021 Initial Biopsy  ? FINAL MICROSCOPIC DIAGNOSIS:  ? ?A. STOMACH, BIOPSY:  ?- Gastric antral and oxyntic mucosa with nonspecific reactive  ?gastropathy  ?- Helicobacter pylori-like organisms are not identified on routine HE stain  ? ?B. DUODENUM, BIOPSY:  ?- Duodenal mucosa with no specific histopathologic changes  ?- Negative for increased intraepithelial lymphocytes or villous  ?architectural changes  ? ?C. COLON, ASCENDING, DESCENDING AND SIGMOID, POLYPECTOMY:  ?- Tubular adenoma(s)  ?- Negative for high-grade dysplasia or malignancy  ? ?D. RECTAL MASS, BIOPSY:  ?- Fragments of an adenoma with multifocal high-grade dysplasia, focally suspicious for intramucosal carcinoma.  See comment  ? ? ?COMMENT:  ?D.  A deeper, more severe process cannot be ruled out.  Clinical and endoscopic correlation is suggested.  ?  ?08/16/2021 Imaging  ? EXAM: ?CT CHEST, ABDOMEN, AND PELVIS WITH CONTRAST ? ?IMPRESSION: ?1. Large rectal mass. Two tiny hypodense lesions in the liver are ?nonspecific with regard to benign etiology or metastatic disease. ?2. 9 mm in long axis hypodense pancreatic body lesion. Possibilities include postinflammatory cyst, intraductal papillary mucinous neoplasm, or less likely pancreatic adenocarcinoma. ?3. Bilateral renal cysts. A complex right kidney upper pole lesion ?is indeterminate for complex cyst versus mass and measures 9 mm in diameter. ?4. In light of these findings, consider upper abdominal MRI with ?contrast to further  characterize the hepatic, pancreatic, and renal ?lesions. ?5. There is also unusual left perirenal stranding especially ?adjacent to a cyst along the left kidney lower pole, this could be ?an indicator of inflammation involving the left kidney but is ?technically nonspecific. Some of this edema tracks along the left ?paracolic gutter. ?6. Advanced systemic atherosclerosis. This includes aortic and ?coronary atherosclerosis along with mesenteric and renal ?atherosclerosis. If further workup of the patient's severe ?atherosclerotic vascular disease is warranted, CT angiography would be recommended. ?7. Small bilateral pleural effusions with passive atelectasis. ?8. Severe centrilobular emphysema.  Emphysema (ICD10-J43.9). ?9. Mild thoracolumbar scoliosis. ?10. Scattered sigmoid colon diverticula. ?  ?08/18/2021 Imaging  ? EXAM: ?MRI ABDOMEN WITHOUT AND WITH CONTRAST (INCLUDING MRCP) ? ?IMPRESSION: ?1. Study is severely limited by beam hardening artifact from a large ?surgical clip in the stomach, which completely obscures the ?previously noted pancreatic lesion on most pulse sequences rendering today's study nondiagnostic. The only visualized hepatic lesion is compatible with a small simple hepatic cyst. ?2. There is abnormal signal intensity and small rim enhancing ?lesions in the right psoas muscle. Clinical correlation for signs ?and symptoms of psoas abscess is recommended. Alternatively, the possibility of intramuscular metastasis could be considered. ?3. Small bilateral pleural effusions lying dependently with areas of ?presumed passive atelectasis in the lower lobes of the lungs ?bilaterally. ?4. Multiple  simple cysts in the kidneys bilaterally. ?5. Aortic atherosclerosis. ?  ?08/20/2021 Initial Diagnosis  ? Rectal cancer ([place]) ? ?  ?08/21/2021 Imaging  ? EXAM: ?MRI PELVIS WITHOUT CONTRAST ? ?IMPRESSION: ?Rectal adenocarcinoma T stage: T2 ?  ?Rectal adenocarcinoma N stage:  N0 ?  ?Distance from tumor to the internal  anal sphincter is 4.5 cm. ?  ? ? ? ?INTERVAL HISTORY:  ?JONIA OAKEY is here for a follow up of rectal cancer. She was last seen by me on 11/20/21. She presents to the clinic alone. ?She reports she i

## 2021-11-26 ENCOUNTER — Ambulatory Visit
Admission: RE | Admit: 2021-11-26 | Discharge: 2021-11-26 | Disposition: A | Discharge status: Home / Self Care | Payer: Medicare HMO | Source: Ambulatory Visit | Attending: Radiation Oncology | Admitting: Radiation Oncology

## 2021-11-26 ENCOUNTER — Other Ambulatory Visit: Payer: Self-pay

## 2021-11-26 ENCOUNTER — Ambulatory Visit: Payer: Medicare HMO

## 2021-11-26 DIAGNOSIS — C20 Malignant neoplasm of rectum: Secondary | ICD-10-CM | POA: Diagnosis not present

## 2021-11-26 DIAGNOSIS — Z510 Encounter for antineoplastic radiation therapy: Secondary | ICD-10-CM | POA: Diagnosis not present

## 2021-11-26 DIAGNOSIS — N189 Chronic kidney disease, unspecified: Secondary | ICD-10-CM | POA: Diagnosis not present

## 2021-11-26 DIAGNOSIS — R609 Edema, unspecified: Secondary | ICD-10-CM | POA: Diagnosis not present

## 2021-11-26 DIAGNOSIS — Z79899 Other long term (current) drug therapy: Secondary | ICD-10-CM | POA: Diagnosis not present

## 2021-11-26 DIAGNOSIS — R5383 Other fatigue: Secondary | ICD-10-CM | POA: Diagnosis not present

## 2021-11-26 DIAGNOSIS — D500 Iron deficiency anemia secondary to blood loss (chronic): Secondary | ICD-10-CM | POA: Diagnosis not present

## 2021-11-26 DIAGNOSIS — Z8616 Personal history of COVID-19: Secondary | ICD-10-CM | POA: Diagnosis not present

## 2021-11-26 DIAGNOSIS — D631 Anemia in chronic kidney disease: Secondary | ICD-10-CM | POA: Diagnosis not present

## 2021-11-26 LAB — RAD ONC ARIA SESSION SUMMARY
Course Elapsed Days: 36
Plan Fractions Treated to Date: 1
Plan Prescribed Dose Per Fraction: 1.8 Gy
Plan Total Fractions Prescribed: 3
Plan Total Prescribed Dose: 5.4 Gy
Reference Point Dosage Given to Date: 46.8 Gy
Reference Point Session Dosage Given: 1.8 Gy
Session Number: 26

## 2021-11-27 ENCOUNTER — Ambulatory Visit
Admission: RE | Admit: 2021-11-27 | Discharge: 2021-11-27 | Disposition: A | Discharge status: Home / Self Care | Payer: Medicare HMO | Source: Ambulatory Visit | Attending: Radiation Oncology | Admitting: Radiation Oncology

## 2021-11-27 ENCOUNTER — Other Ambulatory Visit: Payer: Self-pay

## 2021-11-27 ENCOUNTER — Ambulatory Visit: Payer: Medicare HMO

## 2021-11-27 ENCOUNTER — Inpatient Hospital Stay: Payer: Medicare HMO | Admitting: Dietician

## 2021-11-27 DIAGNOSIS — R609 Edema, unspecified: Secondary | ICD-10-CM | POA: Diagnosis not present

## 2021-11-27 DIAGNOSIS — C20 Malignant neoplasm of rectum: Secondary | ICD-10-CM | POA: Diagnosis not present

## 2021-11-27 DIAGNOSIS — Z79899 Other long term (current) drug therapy: Secondary | ICD-10-CM | POA: Diagnosis not present

## 2021-11-27 DIAGNOSIS — Z510 Encounter for antineoplastic radiation therapy: Secondary | ICD-10-CM | POA: Diagnosis not present

## 2021-11-27 DIAGNOSIS — D631 Anemia in chronic kidney disease: Secondary | ICD-10-CM | POA: Diagnosis not present

## 2021-11-27 DIAGNOSIS — Z8616 Personal history of COVID-19: Secondary | ICD-10-CM | POA: Diagnosis not present

## 2021-11-27 DIAGNOSIS — R5383 Other fatigue: Secondary | ICD-10-CM | POA: Diagnosis not present

## 2021-11-27 DIAGNOSIS — D500 Iron deficiency anemia secondary to blood loss (chronic): Secondary | ICD-10-CM | POA: Diagnosis not present

## 2021-11-27 DIAGNOSIS — N189 Chronic kidney disease, unspecified: Secondary | ICD-10-CM | POA: Diagnosis not present

## 2021-11-27 LAB — RAD ONC ARIA SESSION SUMMARY
Course Elapsed Days: 37
Plan Fractions Treated to Date: 2
Plan Prescribed Dose Per Fraction: 1.8 Gy
Plan Total Fractions Prescribed: 3
Plan Total Prescribed Dose: 5.4 Gy
Reference Point Dosage Given to Date: 48.6 Gy
Reference Point Session Dosage Given: 1.8 Gy
Session Number: 27

## 2021-11-27 NOTE — Progress Notes (Signed)
Nutrition Follow-up: ? ?Patient with rectal cancer. She is receiving concurrent chemoradiation with Xeloda.  ? ?Met with patient following radiation. She reports ongoing poor appetite and diarrhea. Yesterday she recalls sausage, applesauce, toast for breakfast. She had yogurt for snack followed by episode of diarrhea. Patient ate pork chop, potato salad, turnip greens for supper followed by diarrhea. Patient reports trying banatrol packets. She is adding this to applesauce at breakfast. Patient reports tolerating ensure cut with milk.  ? ? ?Medications: reviewed  ? ?Labs: no new labs for review  ? ?Anthropometrics: last aria wt 118.2 lb on 5/12 decreased from 120.2 lb on 5/05 ? ? ?NUTRITION DIAGNOSIS: Severe malnutrition continues ? ?INTERVENTION:  ?Educated on low fiber diet, foods to limit and how to determine low fiber foods with nutrition facts label - handout with high/low fiber lists as well as sample low fiber daily intake provided ?Continue using banatrol - additional samples provided ?Continue drinking Ensure/milk - recommend 1-2/day  ?One complimentary case Ensure Plus High Protein provided  ?  ? ?MONITORING, EVALUATION, GOAL: weight trends, intake  ? ? ?NEXT VISIT: Tuesday June 20 after MD (pt aware) ? ? ? ?

## 2021-11-28 ENCOUNTER — Ambulatory Visit: Payer: Medicare HMO

## 2021-11-28 ENCOUNTER — Ambulatory Visit
Admission: RE | Admit: 2021-11-28 | Discharge: 2021-11-28 | Disposition: A | Discharge status: Home / Self Care | Payer: Medicare HMO | Source: Ambulatory Visit | Attending: Radiation Oncology | Admitting: Radiation Oncology

## 2021-11-28 ENCOUNTER — Other Ambulatory Visit: Payer: Self-pay

## 2021-11-28 ENCOUNTER — Encounter: Payer: Self-pay | Admitting: Radiation Oncology

## 2021-11-28 DIAGNOSIS — D500 Iron deficiency anemia secondary to blood loss (chronic): Secondary | ICD-10-CM | POA: Diagnosis not present

## 2021-11-28 DIAGNOSIS — R609 Edema, unspecified: Secondary | ICD-10-CM | POA: Diagnosis not present

## 2021-11-28 DIAGNOSIS — D631 Anemia in chronic kidney disease: Secondary | ICD-10-CM | POA: Diagnosis not present

## 2021-11-28 DIAGNOSIS — C20 Malignant neoplasm of rectum: Secondary | ICD-10-CM | POA: Diagnosis not present

## 2021-11-28 DIAGNOSIS — Z79899 Other long term (current) drug therapy: Secondary | ICD-10-CM | POA: Diagnosis not present

## 2021-11-28 DIAGNOSIS — N189 Chronic kidney disease, unspecified: Secondary | ICD-10-CM | POA: Diagnosis not present

## 2021-11-28 DIAGNOSIS — Z510 Encounter for antineoplastic radiation therapy: Secondary | ICD-10-CM | POA: Diagnosis not present

## 2021-11-28 DIAGNOSIS — R5383 Other fatigue: Secondary | ICD-10-CM | POA: Diagnosis not present

## 2021-11-28 DIAGNOSIS — Z8616 Personal history of COVID-19: Secondary | ICD-10-CM | POA: Diagnosis not present

## 2021-11-28 LAB — RAD ONC ARIA SESSION SUMMARY
Course Elapsed Days: 38
Plan Fractions Treated to Date: 3
Plan Prescribed Dose Per Fraction: 1.8 Gy
Plan Total Fractions Prescribed: 3
Plan Total Prescribed Dose: 5.4 Gy
Reference Point Dosage Given to Date: 50.4 Gy
Reference Point Session Dosage Given: 1.8 Gy
Session Number: 28

## 2021-11-29 ENCOUNTER — Ambulatory Visit: Payer: Medicare HMO

## 2021-12-02 ENCOUNTER — Ambulatory Visit (INDEPENDENT_AMBULATORY_CARE_PROVIDER_SITE_OTHER): Payer: Medicare HMO

## 2021-12-02 ENCOUNTER — Ambulatory Visit: Payer: Medicare HMO

## 2021-12-02 ENCOUNTER — Ambulatory Visit
Admission: EM | Admit: 2021-12-02 | Discharge: 2021-12-02 | Disposition: A | Discharge status: Home / Self Care | Payer: Medicare HMO | Attending: Family Medicine | Admitting: Family Medicine

## 2021-12-02 DIAGNOSIS — S92401A Displaced unspecified fracture of right great toe, initial encounter for closed fracture: Secondary | ICD-10-CM | POA: Diagnosis not present

## 2021-12-02 DIAGNOSIS — M79671 Pain in right foot: Secondary | ICD-10-CM | POA: Diagnosis not present

## 2021-12-02 DIAGNOSIS — M7989 Other specified soft tissue disorders: Secondary | ICD-10-CM | POA: Diagnosis not present

## 2021-12-02 DIAGNOSIS — S99921A Unspecified injury of right foot, initial encounter: Secondary | ICD-10-CM | POA: Diagnosis not present

## 2021-12-02 NOTE — ED Triage Notes (Signed)
Patient presents to Urgent Care with complaints of Right foot pain and swelling since 2 days ago when she dropped something on her foot she said that there was no pain at the time but noticed it develop later. Patient reports swelling in L calf as well.

## 2021-12-05 NOTE — Progress Notes (Signed)
  Radiation Oncology         [phone] ________________________________  Name: Tina Waller MRN: [id number]  Date: 11/28/2021  DOB: [birth year]  End of Treatment Note  Diagnosis:   Stage I, cT2N0M0, adenocarcinoma of the rectum    Indication for treatment: Curative  Radiation treatment dates:   10/21/21-11/28/21  Site/dose:   The rectum was initially treated to 45 Gy in 25 fractions at 1.8 Gy dose per fraction followed by a 5.4 Gy boost given in 3 fractions at 1.8 Gy dose per fraction.   Narrative: The patient tolerated radiation treatment relatively well.   She developed fatigue and some roughness of her skin in the treatment area. She has also noticed occasional loose stools based on certain foods the eats.   Plan: The patient will receive a call in about one month from the radiation oncology department. She will continue follow up with Dr. Burr Medico and Dr. Marcello Moores.      Carola Rhine, PAC

## 2021-12-06 NOTE — ED Provider Notes (Signed)
[facility]    CSN: [id number] Arrival date & time: 12/02/21  1313      History   Chief Complaint Chief Complaint  Patient presents with   Foot Pain    HPI Tina Waller is a 81 y.o. female.   Presenting today with 2 day history of right foot pain and swelling after dropping a can onto the foot. She didn't notice the pain much until the past day. Swollen and bruised to base of toes and top of foot. Able to bear weight but painful. Denies numbness, tingling, skin changes. Not trying anything OTC for sxs.    Past Medical History:  Diagnosis Date   Anemia    Anxiety    Cataract    Depression    Diabetes mellitus without complication ([place])    pt denies   GERD (gastroesophageal reflux disease)    Gout    Headache    Hypertension    Stroke [facility]) 2008   09/14/2018   Tonsillar mass    Type 2 diabetes mellitus ([place]) 09/14/2018   Vertigo    Wears dentures    upper   Wears glasses     Patient Active Problem List   Diagnosis Date Noted   Rectal cancer ([place])    Protein-calorie malnutrition, severe 08/19/2021   GI bleeding 08/12/2021   GI bleed 08/11/2021   COVID-19 virus infection 08/11/2021   Acute renal failure superimposed on stage 3 chronic kidney disease ([place]) 08/11/2021   Thrombocytopenia (HCC)    Cellulitis of left lower extremity 04/20/2020   Elevated alkaline phosphatase level 07/28/2019   Microscopic hematuria 09/24/2018   Rectal bleeding 09/24/2018   Insomnia 09/24/2018   Anxiety 09/14/2018   Primary hyperparathyroidism ([place]) 02/10/2018   Vitamin B 12 deficiency 12/11/2017   Pulmonary hypertension, mild ([place]) 12/11/2017   CKD (chronic kidney disease) stage 3, GFR 30-59 ml/min (HCC) 04/17/2017   Stasis dermatitis of both legs 04/17/2017   Osteoporosis 12/22/2016   Hx of gout 12/22/2016   Estrogen deficiency 05/12/2016   Vitamin D deficiency 05/12/2016   Weight loss 05/12/2016   HTN (hypertension) 06/12/2015   History of CVA  (cerebrovascular accident) 03/28/2015    Past Surgical History:  Procedure Laterality Date   ABDOMINAL HYSTERECTOMY     BIOPSY  08/15/2021   Procedure: BIOPSY;  Surgeon: Irving Copas., MD;  Location: [facility] ENDOSCOPY;  Service: Gastroenterology;;   BREAST SURGERY     lumpectomy   BUNIONECTOMY     CATARACT EXTRACTION Left    COLONOSCOPY W/ BIOPSIES AND POLYPECTOMY     COLONOSCOPY WITH PROPOFOL N/A 08/15/2021   Procedure: COLONOSCOPY WITH PROPOFOL;  Surgeon: Irving Copas., MD;  Location: [facility] ENDOSCOPY;  Service: Gastroenterology;  Laterality: N/A;   ESOPHAGOGASTRODUODENOSCOPY (EGD) WITH PROPOFOL N/A 08/15/2021   Procedure: ESOPHAGOGASTRODUODENOSCOPY (EGD) WITH PROPOFOL;  Surgeon: Rush Landmark Telford Nab., MD;  Location: [place];  Service: Gastroenterology;  Laterality: N/A;   HEMOSTASIS CLIP PLACEMENT  08/15/2021   Procedure: HEMOSTASIS CLIP PLACEMENT;  Surgeon: Irving Copas., MD;  Location: [place];  Service: Gastroenterology;;   HOT HEMOSTASIS N/A 08/15/2021   Procedure: HOT HEMOSTASIS (ARGON PLASMA COAGULATION/BICAP);  Surgeon: Irving Copas., MD;  Location: [place];  Service: Gastroenterology;  Laterality: N/A;   MULTIPLE TOOTH EXTRACTIONS     POLYPECTOMY  08/15/2021   Procedure: POLYPECTOMY;  Surgeon: Mansouraty, Telford Nab., MD;  Location[place];  Service: Gastroenterology;;   SUBMUCOSAL TATTOO INJECTION  08/15/2021   Procedure: SUBMUCOSAL TATTOO INJECTION;  Surgeon:  Mansouraty, Telford Nab., MD;  Location: [place];  Service: Gastroenterology;;   TONGUE BIOPSY Right 12/13/2018   Procedure: RIGHT TONSIL BIOPSY;  Surgeon: Melida Quitter, MD;  Location: [place];  Service: ENT;  Laterality: Right;    OB History   No obstetric history on file.      Home Medications    Prior to Admission medications   Medication Sig Start Date End Date Taking? Authorizing Provider  acetaminophen (TYLENOL) 500 MG tablet Take 500-1,000 mg by mouth every 6  (six) hours as needed for moderate pain or headache.    [provider]  amLODipine (NORVASC) 10 MG tablet TAKE 1 TABLET EVERY DAY Patient taking differently: Take 10 mg by mouth daily. 08/08/21   [name], MD  capecitabine (XELODA) 500 MG tablet Take 2 tablets (1,000 mg total) by mouth every 12 (twelve) hours. Take within 30 minutes after meals. Take on days of radiation, Monday through Friday. 10/15/21   Truitt Merle, MD  cholecalciferol (VITAMIN D3) 25 MCG (1000 UNIT) tablet Take 2 tablets (2,000 Units total) by mouth daily. 11/14/20   [name], MD  clopidogrel (PLAVIX) 75 MG tablet TAKE 1 TABLET (75 MG TOTAL) BY MOUTH DAILY. 08/06/21   [name], MD  colchicine 0.6 MG tablet Take 1 tablet (0.6 mg total) by mouth daily. 08/22/21   Little Ishikawa, MD  ferrous gluconate (FERGON) 324 MG tablet Take 1 tablet (324 mg total) by mouth daily with breakfast. 08/23/21   Truitt Merle, MD  furosemide (LASIX) 40 MG tablet TAKE 1 TABLET (40 MG TOTAL) BY MOUTH 2 (TWO) TIMES DAILY. 08/06/21   [name], MD  isosorbide mononitrate (IMDUR) 60 MG 24 hr tablet Take 1.5 tablets (90 mg total) by mouth daily. 04/19/21   [name], MD  levalbuterol [facility] HFA) 45 MCG/ACT inhaler Inhale into the lungs. 08/23/21   [provider]  metoprolol succinate (TOPROL-XL) 100 MG 24 hr tablet Take 1 tablet (100 mg total) by mouth daily. Take with or immediately following a meal. 08/22/21   Little Ishikawa, MD  mometasone (ELOCON) 0.1 % ointment Apply topically daily. Patient taking differently: Apply topically daily. As needed 07/26/20   Argentina Donovan, PA-C  Multiple Vitamin (MULTIVITAMIN WITH MINERALS) TABS tablet Take 1 tablet by mouth daily. 08/22/21   Little Ishikawa, MD  ondansetron (ZOFRAN) 4 MG tablet Take 1 tablet (4 mg total) by mouth every 8 (eight) hours as needed for nausea or vomiting. 10/09/21   Truitt Merle, MD  polyethylene glycol [facility]) packet Take 17 g  by mouth daily as needed for mild constipation. 09/15/18   Hongalgi, Lenis Dickinson, MD  potassium chloride (KLOR-CON M) 10 MEQ tablet Take 1 tablet (10 mEq total) by mouth 2 (two) times daily. 11/12/21   Truitt Merle, MD  pravastatin (PRAVACHOL) 80 MG tablet TAKE 1 TABLET EVERY DAY FOR HIGH CHOLESTEROL Patient taking differently: Take 80 mg by mouth daily. 08/08/21   [name], MD  SPIRIVA RESPIMAT 2.5 MCG/ACT AERS  08/23/21   [provider]  traZODone (DESYREL) 50 MG tablet Take 50 mg by mouth at bedtime. 08/23/21   [provider]  vitamin B-12 (CYANOCOBALAMIN) 1000 MCG tablet Take 1,000 mcg by mouth daily.    [provider]    Family History Family History  Problem Relation Age of Onset   Heart disease Mother        CHF   Osteoporosis Neg Hx  Colon cancer Neg Hx    Stomach cancer Neg Hx    Pancreatic cancer Neg Hx     Social History Social History   Tobacco Use   Smoking status: Former    Types: Cigarettes    Quit date: 07/14/2006    Years since quitting: 15.4   Smokeless tobacco: Never  Vaping Use   Vaping Use: Never used  Substance Use Topics   Alcohol use: Yes    Alcohol/week: 4.0 standard drinks    Types: 4 Shots of liquor per week    Comment: 4 drinks a week   Drug use: No     Allergies   Lisinopril, Carvedilol, Hydralazine, and Lipitor [atorvastatin]   Review of Systems Review of Systems PER HPI  Physical Exam Triage Vital Signs ED Triage Vitals  Enc Vitals Group     BP 12/02/21 1406 (!) 193/91     Pulse Rate 12/02/21 1406 (!) 102     Resp 12/02/21 1406 18     Temp 12/02/21 1406 98.1 F (36.7 C)     Temp src --      SpO2 12/02/21 1406 96 %     Weight --      Height --      Head Circumference --      Peak Flow --      Pain Score 12/02/21 1405 5     Pain Loc --      Pain Edu? --      Excl. in [place]? --    No data found.  Updated Vital Signs BP (!) 193/91   Pulse (!) 102   Temp 98.1 F (36.7 C)   Resp 18   SpO2 96%    Visual Acuity Right Eye Distance:   Left Eye Distance:   Bilateral Distance:    Right Eye Near:   Left Eye Near:    Bilateral Near:     Physical Exam Vitals and nursing note reviewed.  Constitutional:      Appearance: Normal appearance. She is not ill-appearing.  HENT:     Head: Atraumatic.  Eyes:     Extraocular Movements: Extraocular movements intact.     Conjunctiva/sclera: Conjunctivae normal.  Cardiovascular:     Rate and Rhythm: Normal rate and regular rhythm.     Heart sounds: Normal heart sounds.  Pulmonary:     Effort: Pulmonary effort is normal.     Breath sounds: Normal breath sounds.  Musculoskeletal:        General: Swelling, tenderness and signs of injury present. No deformity. Normal range of motion.     Cervical back: Normal range of motion and neck supple.     Comments: Ttp, bruised and swollen right great toe  Skin:    General: Skin is warm and dry.     Findings: Bruising present.  Neurological:     Mental Status: She is alert. Mental status is at baseline.     Comments: Right foot neurovascularly intact  Psychiatric:        Mood and Affect: Mood normal.        Thought Content: Thought content normal.        Judgment: Judgment normal.     UC Treatments / Results  Labs (all labs ordered are listed, but only abnormal results are displayed) Labs Reviewed - No data to display  EKG   Radiology No results found.  Procedures Procedures (including critical care time)  Medications Ordered in UC Medications - No data to  display  Initial Impression / Assessment and Plan / UC Course  I have reviewed the triage vital signs and the nursing notes.  Pertinent labs & imaging results that were available during my care of the patient were reviewed by me and considered in my medical decision making (see chart for details).     Right foot x-ray showing fracture to right great toe. Placed in post op shoe, discussed RICE protocol, OTC pain relievers and  Podiatry f/u.  Final Clinical Impressions(s) / UC Diagnoses   Final diagnoses:  Closed displaced fracture of phalanx of right great toe, unspecified phalanx, initial encounter   Discharge Instructions   None    ED Prescriptions   None    PDMP not reviewed this encounter.   Volney American, [place] 12/07/21 0003

## 2021-12-13 ENCOUNTER — Ambulatory Visit (INDEPENDENT_AMBULATORY_CARE_PROVIDER_SITE_OTHER): Payer: Medicare HMO | Admitting: Podiatry

## 2021-12-13 DIAGNOSIS — R6889 Other general symptoms and signs: Secondary | ICD-10-CM | POA: Diagnosis not present

## 2021-12-13 DIAGNOSIS — S92424A Nondisplaced fracture of distal phalanx of right great toe, initial encounter for closed fracture: Secondary | ICD-10-CM | POA: Diagnosis not present

## 2021-12-13 NOTE — Progress Notes (Signed)
Subjective:  Patient ID: Tina Waller, female    DOB: 03-Jan-1941,  MRN: [id number]  Chief Complaint  Patient presents with   Fracture    Right foot great toe    81 y.o. female presents with the above complaint.  Patient presents with right great toe fracture of the distal phalanx small avulsion fracture.  Patient states it happened about Dec 02, 2021.  Patient states it is there is some pain but very minimal is being controlled by Tylenol.  She is ambulating with surgical shoe she went to urgent care had x-rays taken.  She is a diabetic that is controlled.  She denies any other acute complaints.   Review of Systems: Negative except as noted in the HPI. Denies N/V/F/Ch.  Past Medical History:  Diagnosis Date   Anemia    Anxiety    Cataract    Depression    Diabetes mellitus without complication ([place])    pt denies   GERD (gastroesophageal reflux disease)    Gout    Headache    Hypertension    Stroke [facility]) 2008   09/14/2018   Tonsillar mass    Type 2 diabetes mellitus ([place]) 09/14/2018   Vertigo    Wears dentures    upper   Wears glasses     Current Outpatient Medications:    acetaminophen (TYLENOL) 500 MG tablet, Take 500-1,000 mg by mouth every 6 (six) hours as needed for moderate pain or headache., Disp: , Rfl:    amLODipine (NORVASC) 10 MG tablet, TAKE 1 TABLET EVERY DAY (Patient taking differently: Take 10 mg by mouth daily.), Disp: 90 tablet, Rfl: 0   capecitabine (XELODA) 500 MG tablet, Take 2 tablets (1,000 mg total) by mouth every 12 (twelve) hours. Take within 30 minutes after meals. Take on days of radiation, Monday through Friday., Disp: 60 tablet, Rfl: 1   cholecalciferol (VITAMIN D3) 25 MCG (1000 UNIT) tablet, Take 2 tablets (2,000 Units total) by mouth daily., Disp: 100 tablet, Rfl: 1   clopidogrel (PLAVIX) 75 MG tablet, TAKE 1 TABLET (75 MG TOTAL) BY MOUTH DAILY., Disp: 90 tablet, Rfl: 0   colchicine 0.6 MG tablet, Take 1 tablet (0.6 mg total) by mouth daily.,  Disp: 14 tablet, Rfl: 0   ferrous gluconate (FERGON) 324 MG tablet, Take 1 tablet (324 mg total) by mouth daily with breakfast., Disp: 30 tablet, Rfl: 0   furosemide (LASIX) 40 MG tablet, TAKE 1 TABLET (40 MG TOTAL) BY MOUTH 2 (TWO) TIMES DAILY., Disp: 180 tablet, Rfl: 0   isosorbide mononitrate (IMDUR) 60 MG 24 hr tablet, Take 1.5 tablets (90 mg total) by mouth daily., Disp: 135 tablet, Rfl: 3   levalbuterol (XOPENEX HFA) 45 MCG/ACT inhaler, Inhale into the lungs., Disp: , Rfl:    metoprolol succinate (TOPROL-XL) 100 MG 24 hr tablet, Take 1 tablet (100 mg total) by mouth daily. Take with or immediately following a meal., Disp: 30 tablet, Rfl: 0   mometasone (ELOCON) 0.1 % ointment, Apply topically daily. (Patient taking differently: Apply topically daily. As needed), Disp: 45 g, Rfl: 0   Multiple Vitamin (MULTIVITAMIN WITH MINERALS) TABS tablet, Take 1 tablet by mouth daily., Disp: 30 tablet, Rfl: 0   ondansetron (ZOFRAN) 4 MG tablet, Take 1 tablet (4 mg total) by mouth every 8 (eight) hours as needed for nausea or vomiting., Disp: 20 tablet, Rfl: 0   polyethylene glycol (MIRALAX) packet, Take 17 g by mouth daily as needed for mild constipation., Disp: , Rfl:  potassium chloride (KLOR-CON M) 10 MEQ tablet, Take 1 tablet (10 mEq total) by mouth 2 (two) times daily., Disp: 14 tablet, Rfl: 0   pravastatin (PRAVACHOL) 80 MG tablet, TAKE 1 TABLET EVERY DAY FOR HIGH CHOLESTEROL (Patient taking differently: Take 80 mg by mouth daily.), Disp: 90 tablet, Rfl: 0   SPIRIVA RESPIMAT 2.5 MCG/ACT AERS, , Disp: , Rfl:    traZODone (DESYREL) 50 MG tablet, Take 50 mg by mouth at bedtime., Disp: , Rfl:    vitamin B-12 (CYANOCOBALAMIN) 1000 MCG tablet, Take 1,000 mcg by mouth daily., Disp: , Rfl:   Social History   Tobacco Use  Smoking Status Former   Types: Cigarettes   Quit date: 07/14/2006   Years since quitting: 15.4  Smokeless Tobacco Never    Allergies  Allergen Reactions   Lisinopril Cough    Carvedilol Diarrhea   Hydralazine     Vomiting and diarrhea   Lipitor [Atorvastatin] Diarrhea   Objective:  There were no vitals filed for this visit. There is no height or weight on file to calculate BMI. Constitutional Well developed. Well nourished.  Vascular Dorsalis pedis pulses palpable bilaterally. Posterior tibial pulses palpable bilaterally. Capillary refill normal to all digits.  No cyanosis or clubbing noted. Pedal hair growth normal.  Neurologic Normal speech. Oriented to person, place, and time. Epicritic sensation to light touch grossly present bilaterally.  Dermatologic Nails well groomed and normal in appearance. No open wounds. No skin lesions.  Orthopedic: Mild pain on palpation to the right base of the great toe at the IPJ joint.  Mild pain with range of motion of the IPJ.  No pain at the metatarsophalangeal joint.  No ecchymosis noted no open wounds or lesion noted.  No signs of infection noted.   Radiographs: Prior x-rays were reviewed appears the patient has a small well aligned nondisplaced fracture of the great phalanx that could be trial intra-articular.  No other breaks noted no other abnormalities noted. Assessment:   1. Closed nondisplaced fracture of distal phalanx of right great toe, initial encounter    Plan:  Patient was evaluated and treated and all questions answered.  Right distal phalanx fracture of the great toe.  Minimally displaced -All questions and concerns were discussed with the patient in extensive detail. -I discussed with her we will take about 6 weeks of healing time.  She will continue wearing surgical shoe for 6 weeks I will see her back and we will transition her to regular shoes.  She states understanding.  No follow-ups on file.

## 2021-12-19 ENCOUNTER — Encounter: Payer: Self-pay | Admitting: Licensed Clinical Social Worker

## 2021-12-19 DIAGNOSIS — C20 Malignant neoplasm of rectum: Secondary | ICD-10-CM

## 2021-12-19 NOTE — Progress Notes (Signed)
[place] CSW Progress Note  Clinical Education officer, museum returned message left by pt.  Pt informed writer her neighbor had encouraged her to call to see if she might be eligible for any services that social work could put her in touch with.  Pt reports she resides alone.  Has a son residing in [place] who visits every other week and assists pt w/ getting groceries and running errands.  Pt utilizes [facility] for appointments at the cancer center and [facility] for all other transportation needs.  Pt has needed a walker for ambulation and was prescribed home oxygen following a hospitalization due to Covid.  Pt has not had follow up w/ a pulmonologist since being prescribed oxygen and has noticed her electric bill significantly increased with it's use.  Pt initially was using oxygen all day; however, is now only using it overnight due to the increase in her electric bill.  Writer encouraged pt to speak w/ her PCP at her appointment on 6/27 about the oxygen to see if she may be able to discontinue using it or if she should be using it all day.  Pt states she has a case Freight forwarder through [employer] who assists w/ most needs and currently has pt on a wait list for a housekeeper to assist w/ cleaning.  Writer provided pt w/ contact information for Patient financial resource specialist to see if pt may qualify for a grant to assist w/ the electric bill while undergoing treatment.  Pt reports no additional needs at this time.  CSW to remain available as appropriate. To assist pt as needed throughout duration of treatment.      Henriette Combs, LCSW

## 2021-12-23 ENCOUNTER — Telehealth: Payer: Self-pay

## 2021-12-23 NOTE — Telephone Encounter (Signed)
Message received from [employer]:  We are contacting you to let you know we have been unable to reach the client listed below in 60 days. Our current business policy indicates we prefer to have contact with each client AT LEAST once every 30 days to ensure they are receiving their meals and are happy with their selections.

## 2021-12-24 ENCOUNTER — Encounter: Payer: Self-pay | Admitting: Licensed Clinical Social Worker

## 2021-12-24 DIAGNOSIS — C20 Malignant neoplasm of rectum: Secondary | ICD-10-CM

## 2021-12-24 NOTE — Progress Notes (Signed)
[place] CSW Progress Note  Clinical Education officer, museum received a message regarding pt's [employer] delivery stating pt will need to contact them in order to continue delivery as they have not had contact w/ pt in over 60 days.  Voice message left for pt regarding the above w/ contact details given for [employer] and instructions to call to ensure continued delivery.  CSW to remain available to assist pt as appropriate throughout duration of treatment.    Henriette Combs, LCSW

## 2021-12-25 ENCOUNTER — Other Ambulatory Visit (HOSPITAL_COMMUNITY): Payer: Self-pay

## 2021-12-30 DIAGNOSIS — R6889 Other general symptoms and signs: Secondary | ICD-10-CM | POA: Diagnosis not present

## 2021-12-30 DIAGNOSIS — C20 Malignant neoplasm of rectum: Secondary | ICD-10-CM | POA: Diagnosis not present

## 2021-12-31 ENCOUNTER — Inpatient Hospital Stay: Payer: Medicare HMO | Attending: Radiation Oncology | Admitting: Hematology

## 2021-12-31 ENCOUNTER — Inpatient Hospital Stay: Payer: Medicare HMO

## 2021-12-31 ENCOUNTER — Inpatient Hospital Stay: Payer: Medicare HMO | Admitting: Dietician

## 2021-12-31 NOTE — Progress Notes (Signed)
Patient did not show for nutrition appointment. 

## 2022-01-03 ENCOUNTER — Telehealth: Payer: Self-pay | Admitting: Hematology

## 2022-01-06 ENCOUNTER — Ambulatory Visit
Admission: RE | Admit: 2022-01-06 | Discharge: 2022-01-06 | Disposition: A | Discharge status: Home / Self Care | Payer: Medicare HMO | Source: Ambulatory Visit | Attending: Hematology | Admitting: Hematology

## 2022-01-06 DIAGNOSIS — C20 Malignant neoplasm of rectum: Secondary | ICD-10-CM

## 2022-01-07 ENCOUNTER — Ambulatory Visit: Payer: Medicare HMO | Attending: Internal Medicine | Admitting: Internal Medicine

## 2022-01-07 ENCOUNTER — Encounter: Payer: Self-pay | Admitting: Internal Medicine

## 2022-01-07 ENCOUNTER — Telehealth: Payer: Self-pay

## 2022-01-07 VITALS — BP 160/90 | HR 69 | Temp 98.0°F | Resp 12 | Wt 116.0 lb

## 2022-01-07 DIAGNOSIS — J9611 Chronic respiratory failure with hypoxia: Secondary | ICD-10-CM | POA: Diagnosis not present

## 2022-01-07 DIAGNOSIS — E46 Unspecified protein-calorie malnutrition: Secondary | ICD-10-CM

## 2022-01-07 DIAGNOSIS — N1831 Chronic kidney disease, stage 3a: Secondary | ICD-10-CM | POA: Diagnosis not present

## 2022-01-07 DIAGNOSIS — E210 Primary hyperparathyroidism: Secondary | ICD-10-CM | POA: Diagnosis not present

## 2022-01-07 DIAGNOSIS — R6889 Other general symptoms and signs: Secondary | ICD-10-CM | POA: Diagnosis not present

## 2022-01-07 DIAGNOSIS — C20 Malignant neoplasm of rectum: Secondary | ICD-10-CM | POA: Diagnosis not present

## 2022-01-07 DIAGNOSIS — R54 Age-related physical debility: Secondary | ICD-10-CM | POA: Diagnosis not present

## 2022-01-07 DIAGNOSIS — I10 Essential (primary) hypertension: Secondary | ICD-10-CM

## 2022-01-20 ENCOUNTER — Ambulatory Visit: Payer: Medicare HMO | Admitting: Hematology

## 2022-01-20 ENCOUNTER — Other Ambulatory Visit: Payer: Medicare HMO

## 2022-01-21 ENCOUNTER — Inpatient Hospital Stay: Payer: Medicare HMO | Attending: Hematology

## 2022-01-21 ENCOUNTER — Other Ambulatory Visit: Payer: Self-pay

## 2022-01-21 ENCOUNTER — Inpatient Hospital Stay: Payer: Medicare HMO | Admitting: Hematology

## 2022-01-21 ENCOUNTER — Inpatient Hospital Stay: Payer: Medicare HMO

## 2022-01-21 ENCOUNTER — Encounter: Payer: Self-pay | Admitting: Hematology

## 2022-01-21 VITALS — BP 187/87 | HR 69 | Temp 97.9°F | Resp 18 | Ht 69.0 in | Wt 119.2 lb

## 2022-01-21 DIAGNOSIS — Z8616 Personal history of COVID-19: Secondary | ICD-10-CM | POA: Diagnosis not present

## 2022-01-21 DIAGNOSIS — Z87891 Personal history of nicotine dependence: Secondary | ICD-10-CM | POA: Diagnosis not present

## 2022-01-21 DIAGNOSIS — D649 Anemia, unspecified: Secondary | ICD-10-CM | POA: Insufficient documentation

## 2022-01-21 DIAGNOSIS — Z79899 Other long term (current) drug therapy: Secondary | ICD-10-CM | POA: Diagnosis not present

## 2022-01-21 DIAGNOSIS — C20 Malignant neoplasm of rectum: Secondary | ICD-10-CM | POA: Diagnosis not present

## 2022-01-21 DIAGNOSIS — I10 Essential (primary) hypertension: Secondary | ICD-10-CM | POA: Diagnosis not present

## 2022-01-21 DIAGNOSIS — R5383 Other fatigue: Secondary | ICD-10-CM | POA: Diagnosis not present

## 2022-01-21 DIAGNOSIS — K922 Gastrointestinal hemorrhage, unspecified: Secondary | ICD-10-CM

## 2022-01-21 LAB — CMP (CANCER CENTER ONLY)
ALT: 8 U/L (ref 0–44)
AST: 14 U/L — ABNORMAL LOW (ref 15–41)
Albumin: 3.9 g/dL (ref 3.5–5.0)
Alkaline Phosphatase: 78 U/L (ref 38–126)
Anion gap: 6 (ref 5–15)
BUN: 20 mg/dL (ref 8–23)
CO2: 31 mmol/L (ref 22–32)
Calcium: 9.1 mg/dL (ref 8.9–10.3)
Chloride: 103 mmol/L (ref 98–111)
Creatinine: 1.31 mg/dL — ABNORMAL HIGH (ref 0.44–1.00)
GFR, Estimated: 42 mL/min — ABNORMAL LOW (ref >60)
Glucose, Bld: 94 mg/dL (ref 70–99)
Potassium: 3.3 mmol/L — ABNORMAL LOW (ref 3.5–5.1)
Sodium: 140 mmol/L (ref 135–145)
Total Bilirubin: 0.5 mg/dL (ref 0.3–1.2)
Total Protein: 7.1 g/dL (ref 6.5–8.1)

## 2022-01-21 LAB — CBC WITH DIFFERENTIAL (CANCER CENTER ONLY)
Abs Immature Granulocytes: 0.01 10*3/uL (ref 0.00–0.07)
Basophils Absolute: 0 10*3/uL (ref 0.0–0.1)
Basophils Relative: 0 %
Eosinophils Absolute: 0.2 10*3/uL (ref 0.0–0.5)
Eosinophils Relative: 4 %
HCT: 29.7 % — ABNORMAL LOW (ref 36.0–46.0)
Hemoglobin: 10 g/dL — ABNORMAL LOW (ref 12.0–15.0)
Immature Granulocytes: 0 %
Lymphocytes Relative: 38 %
Lymphs Abs: 1.7 10*3/uL (ref 0.7–4.0)
MCH: 33.2 pg (ref 26.0–34.0)
MCHC: 33.7 g/dL (ref 30.0–36.0)
MCV: 98.7 fL (ref 80.0–100.0)
Monocytes Absolute: 0.7 10*3/uL (ref 0.1–1.0)
Monocytes Relative: 15 %
Neutro Abs: 1.9 10*3/uL (ref 1.7–7.7)
Neutrophils Relative %: 43 %
Platelet Count: 190 10*3/uL (ref 150–400)
RBC: 3.01 MIL/uL — ABNORMAL LOW (ref 3.87–5.11)
RDW: 12.1 % (ref 11.5–15.5)
WBC Count: 4.5 10*3/uL (ref 4.0–10.5)
nRBC: 0 % (ref 0.0–0.2)

## 2022-01-21 LAB — FERRITIN: Ferritin: 49 ng/mL (ref 11–307)

## 2022-01-21 LAB — IRON AND IRON BINDING CAPACITY (CC-WL,HP ONLY)
Iron: 89 ug/dL (ref 28–170)
Saturation Ratios: 32 % — ABNORMAL HIGH (ref 10.4–31.8)
TIBC: 281 ug/dL (ref 250–450)
UIBC: 192 ug/dL (ref 148–442)

## 2022-01-21 LAB — CEA (IN HOUSE-CHCC): CEA (CHCC-In House): 2.35 ng/mL (ref 0.00–5.00)

## 2022-01-21 NOTE — Progress Notes (Signed)
Tina Waller   [phone] [phone]   Clinic Follow up Note   Patient Care Team: [name], MD as PCP - General (Internal Medicine) Truitt Merle, MD as Consulting Physician (Hematology)  Date of Service:  01/21/2022  CHIEF COMPLAINT: f/u of rectal cancer  CURRENT THERAPY:  Observation   ASSESSMENT & PLAN:  Tina Waller is a 81 y.o. female with   1. Rectal Cancer, stage I c(T2, N0)M0 -presented to ED on 08/10/21 with weakness and black stool. Colonoscopy and EGD on 08/15/21 by Dr. Rush Landmark showed a palpable, partially obstructing rectal mass. Path confirmed fragments of adenocarcinoma with multifocal high-grade dysplasia and focally suspicious for intramucosal carcinoma. -CT CAP on 08/16/21 showed: two tiny nonspecific liver lesions; 9 mm hypodense pancreatic body lesion. MRCP on 08/18/21 showed hepatic lesion compatible with simple hepatic cyst, within limitation of beam hardening artifact, and small lesions in right psoas muscle. -baseline CEA on 08/16/21 was normal at 3.7. -staging pelvis MRI on 08/21/21 showed stage T2 N0 rectal cancer. -she met Dr. Marcello Moores on 09/23/21. Pt declines surgery due to concern for side effects/quality of life as she lives alone. -started concurrent radiation with Xeloda (1000 mg BID) first dose 10/21/21. she completed on 11/28/21. Overall tolerated well  -She has recovered well from chemoradiation.  She is clinically doing well, no clinical concern for cancer progression. -Lab reviewed, exam was unremarkable, including rectal exam. -She will follow-up with Dr. Marcello Moores for anoscope exam next months.   2. Weakness and fatigue, COVID+ 08/10/21 -Overall much improved since she recovered from chemoradiation.   4. Anemia -secondary to GI blood loss and CKD -Overall improving.  5. Social Support -she previously lived independently and did not require assistance. -she is currently in an apartment and has support from some  of her neighbors. -she has a son in [place], who visits her every other week, but no other nearby friends or relatives.   6. Hypertension -managed by PCP -Complaint with her norvac and metoprolol. She also takes lasix. -Overall still high, I encouraged her to continue follow-up with her PCP.     PLAN: -Continue cancer surveillance, she will follow-up with Dr. Marcello Moores for endoscope next months. -repeat pelvic MRI before next visit in 3 months    No problem-specific Assessment & Plan notes found for this encounter.   SUMMARY OF ONCOLOGIC HISTORY: Oncology History  Rectal cancer ([place])  08/15/2021 Cancer Staging   Staging form: Colon and Rectum, AJCC 8th Edition - Clinical stage from 08/15/2021: Stage I (cT2, cN0, cM0) - Signed by Truitt Merle, MD on 09/05/2021 Stage prefix: Initial diagnosis Total positive nodes: 0   08/15/2021 Procedure   Colonoscopy, Dr. Rush Landmark  Impression: - Palpable rectal mass found on digital rectal exam. - The examined portion of the ileum was normal. - Four 3 to 7 mm polyps at the recto-sigmoid colon, in the descending colon and in the ascending colon, removed with a cold snare. Resected and retrieved. - Rule out malignancy, partially obstructing tumor in the proximal/middle rectum. Biopsied. Tattooed proximal to lesion. - Diverticulosis in the entire examined colon. - Normal mucosa in the entire examined colon otherwise. - Non-bleeding non-thrombosed external and internal hemorrhoids.   08/15/2021 Initial Biopsy   FINAL MICROSCOPIC DIAGNOSIS:   A. STOMACH, BIOPSY:  - Gastric antral and oxyntic mucosa with nonspecific reactive  gastropathy  - Helicobacter pylori-like organisms are not identified on routine HE stain   B. DUODENUM, BIOPSY:  - Duodenal mucosa with no specific  histopathologic changes  - Negative for increased intraepithelial lymphocytes or villous  architectural changes   C. COLON, ASCENDING, DESCENDING AND SIGMOID, POLYPECTOMY:  - Tubular  adenoma(s)  - Negative for high-grade dysplasia or malignancy   D. RECTAL MASS, BIOPSY:  - Fragments of an adenoma with multifocal high-grade dysplasia, focally suspicious for intramucosal carcinoma.  See comment    COMMENT:  D.  A deeper, more severe process cannot be ruled out.  Clinical and endoscopic correlation is suggested.    08/16/2021 Imaging   EXAM: CT CHEST, ABDOMEN, AND PELVIS WITH CONTRAST  IMPRESSION: 1. Large rectal mass. Two tiny hypodense lesions in the liver are nonspecific with regard to benign etiology or metastatic disease. 2. 9 mm in long axis hypodense pancreatic body lesion. Possibilities include postinflammatory cyst, intraductal papillary mucinous neoplasm, or less likely pancreatic adenocarcinoma. 3. Bilateral renal cysts. A complex right kidney upper pole lesion is indeterminate for complex cyst versus mass and measures 9 mm in diameter. 4. In light of these findings, consider upper abdominal MRI with contrast to further characterize the hepatic, pancreatic, and renal lesions. 5. There is also unusual left perirenal stranding especially adjacent to a cyst along the left kidney lower pole, this could be an indicator of inflammation involving the left kidney but is technically nonspecific. Some of this edema tracks along the left paracolic gutter. 6. Advanced systemic atherosclerosis. This includes aortic and coronary atherosclerosis along with mesenteric and renal atherosclerosis. If further workup of the patient's severe atherosclerotic vascular disease is warranted, CT angiography would be recommended. 7. Small bilateral pleural effusions with passive atelectasis. 8. Severe centrilobular emphysema.  Emphysema (ICD10-J43.9). 9. Mild thoracolumbar scoliosis. 10. Scattered sigmoid colon diverticula.   08/18/2021 Imaging   EXAM: MRI ABDOMEN WITHOUT AND WITH CONTRAST (INCLUDING MRCP)  IMPRESSION: 1. Study is severely limited by beam hardening artifact  from a large surgical clip in the stomach, which completely obscures the previously noted pancreatic lesion on most pulse sequences rendering today's study nondiagnostic. The only visualized hepatic lesion is compatible with a small simple hepatic cyst. 2. There is abnormal signal intensity and small rim enhancing lesions in the right psoas muscle. Clinical correlation for signs and symptoms of psoas abscess is recommended. Alternatively, the possibility of intramuscular metastasis could be considered. 3. Small bilateral pleural effusions lying dependently with areas of presumed passive atelectasis in the lower lobes of the lungs bilaterally. 4. Multiple simple cysts in the kidneys bilaterally. 5. Aortic atherosclerosis.   08/20/2021 Initial Diagnosis   Rectal cancer ([place])   08/21/2021 Imaging   EXAM: MRI PELVIS WITHOUT CONTRAST  IMPRESSION: Rectal adenocarcinoma T stage: T2   Rectal adenocarcinoma N stage:  N0   Distance from tumor to the internal anal sphincter is 4.5 cm.      INTERVAL HISTORY:  Tina Waller is here for a follow up of rectal cancer. She was last seen by me on 11/25/2021. She is doing well overall, BM is normal, no bleeding, she sometime had black stool, but formed. She has mild fatigue, able to function well at home.    All other systems were reviewed with the patient and are negative.  MEDICAL HISTORY:  Past Medical History:  Diagnosis Date   Anemia    Anxiety    Cataract    Depression    Diabetes mellitus without complication ([place])    pt denies   GERD (gastroesophageal reflux disease)    Gout    Headache    Hypertension    Stroke (  [place]) 2008   09/14/2018   Tonsillar mass    Type 2 diabetes mellitus ([place]) 09/14/2018   Vertigo    Wears dentures    upper   Wears glasses     SURGICAL HISTORY: Past Surgical History:  Procedure Laterality Date   ABDOMINAL HYSTERECTOMY     BIOPSY  08/15/2021   Procedure: BIOPSY;  Surgeon: Rush Landmark Telford Nab.,  MD;  Location: [facility] ENDOSCOPY;  Service: Gastroenterology;;   BREAST SURGERY     lumpectomy   BUNIONECTOMY     CATARACT EXTRACTION Left    COLONOSCOPY W/ BIOPSIES AND POLYPECTOMY     COLONOSCOPY WITH PROPOFOL N/A 08/15/2021   Procedure: COLONOSCOPY WITH PROPOFOL;  Surgeon: Irving Copas., MD;  Location: [place];  Service: Gastroenterology;  Laterality: N/A;   ESOPHAGOGASTRODUODENOSCOPY (EGD) WITH PROPOFOL N/A 08/15/2021   Procedure: ESOPHAGOGASTRODUODENOSCOPY (EGD) WITH PROPOFOL;  Surgeon: Rush Landmark Telford Nab., MD;  Location: [place];  Service: Gastroenterology;  Laterality: N/A;   HEMOSTASIS CLIP PLACEMENT  08/15/2021   Procedure: HEMOSTASIS CLIP PLACEMENT;  Surgeon: Irving Copas., MD;  Location: [place];  Service: Gastroenterology;;   HOT HEMOSTASIS N/A 08/15/2021   Procedure: HOT HEMOSTASIS (ARGON PLASMA COAGULATION/BICAP);  Surgeon: Irving Copas., MD;  Location: [place];  Service: Gastroenterology;  Laterality: N/A;   MULTIPLE TOOTH EXTRACTIONS     POLYPECTOMY  08/15/2021   Procedure: POLYPECTOMY;  Surgeon: Mansouraty, Telford Nab., MD;  Location: [place];  Service: Gastroenterology;;   SUBMUCOSAL TATTOO INJECTION  08/15/2021   Procedure: SUBMUCOSAL TATTOO INJECTION;  Surgeon: Irving Copas., MD;  Location: [place];  Service: Gastroenterology;;   TONGUE BIOPSY Right 12/13/2018   Procedure: RIGHT TONSIL BIOPSY;  Surgeon: Melida Quitter, MD;  Location: [place];  Service: ENT;  Laterality: Right;    I have reviewed the social history and family history with the patient and they are unchanged from previous note.  ALLERGIES:  is allergic to lisinopril, carvedilol, hydralazine, and lipitor [atorvastatin].  MEDICATIONS:  Current Outpatient Medications  Medication Sig Dispense Refill   acetaminophen (TYLENOL) 500 MG tablet Take 500-1,000 mg by mouth every 6 (six) hours as needed for moderate pain or headache.     amLODipine (NORVASC) 10 MG  tablet TAKE 1 TABLET EVERY DAY (Patient taking differently: Take 10 mg by mouth daily.) 90 tablet 0   capecitabine (XELODA) 500 MG tablet Take 2 tablets (1,000 mg total) by mouth every 12 (twelve) hours. Take within 30 minutes after meals. Take on days of radiation, Monday through Friday. (Patient not taking: Reported on 01/07/2022) 60 tablet 1   cholecalciferol (VITAMIN D3) 25 MCG (1000 UNIT) tablet Take 2 tablets (2,000 Units total) by mouth daily. 100 tablet 1   clopidogrel (PLAVIX) 75 MG tablet TAKE 1 TABLET (75 MG TOTAL) BY MOUTH DAILY. (Patient not taking: Reported on 01/07/2022) 90 tablet 0   colchicine 0.6 MG tablet Take 1 tablet (0.6 mg total) by mouth daily. (Patient not taking: Reported on 01/07/2022) 14 tablet 0   ferrous gluconate (FERGON) 324 MG tablet Take 1 tablet (324 mg total) by mouth daily with breakfast. 30 tablet 0   furosemide (LASIX) 40 MG tablet TAKE 1 TABLET (40 MG TOTAL) BY MOUTH 2 (TWO) TIMES DAILY. 180 tablet 0   isosorbide mononitrate (IMDUR) 60 MG 24 hr tablet Take 1.5 tablets (90 mg total) by mouth daily. 135 tablet 3   levalbuterol (XOPENEX HFA) 45 MCG/ACT inhaler Inhale into the lungs. (Patient not taking: Reported on 01/07/2022)     metoprolol  succinate (TOPROL-XL) 100 MG 24 hr tablet Take 1 tablet (100 mg total) by mouth daily. Take with or immediately following a meal. 30 tablet 0   mometasone (ELOCON) 0.1 % ointment Apply topically daily. (Patient taking differently: Apply topically daily. As needed) 45 g 0   Multiple Vitamin (MULTIVITAMIN WITH MINERALS) TABS tablet Take 1 tablet by mouth daily. 30 tablet 0   ondansetron (ZOFRAN) 4 MG tablet Take 1 tablet (4 mg total) by mouth every 8 (eight) hours as needed for nausea or vomiting. (Patient not taking: Reported on 01/07/2022) 20 tablet 0   polyethylene glycol (MIRALAX) packet Take 17 g by mouth daily as needed for mild constipation. (Patient not taking: Reported on 01/07/2022)     potassium chloride (KLOR-CON M) 10 MEQ  tablet Take 1 tablet (10 mEq total) by mouth 2 (two) times daily. (Patient not taking: Reported on 01/07/2022) 14 tablet 0   pravastatin (PRAVACHOL) 80 MG tablet TAKE 1 TABLET EVERY DAY FOR HIGH CHOLESTEROL (Patient taking differently: Take 80 mg by mouth daily.) 90 tablet 0   SPIRIVA RESPIMAT 2.5 MCG/ACT AERS      traZODone (DESYREL) 50 MG tablet Take 50 mg by mouth at bedtime.     vitamin B-12 (CYANOCOBALAMIN) 1000 MCG tablet Take 1,000 mcg by mouth daily.     No current facility-administered medications for this visit.    PHYSICAL EXAMINATION: ECOG PERFORMANCE STATUS: 2 - Symptomatic, <50% confined to bed  Vitals:   01/21/22 1422  BP: (!) 187/87  Pulse: 69  Resp: 18  Temp: 97.9 F (36.6 C)  SpO2: 99%    Wt Readings from Last 3 Encounters:  01/21/22 119 lb 3.2 oz (54.1 kg)  01/07/22 116 lb (52.6 kg)  11/25/21 117 lb 14.4 oz (53.5 kg)     GENERAL:alert, no distress and comfortable SKIN: skin color normal, no rashes or significant lesions EYES: normal, Conjunctiva are pink and non-injected, sclera clear  NEURO: alert & oriented x 3 with fluent speech Lung clear to auscultation Heart regular rhythm no leg edema, Abdomen soft, nontender.  Rectal exam was negative, no palpable mass or blood on glove.  Still appears to be black.   LABORATORY DATA:  I have reviewed the data as listed    Latest Ref Rng & Units 01/21/2022    1:58 PM 11/25/2021   10:09 AM 11/20/2021    9:52 AM  CBC  WBC 4.0 - 10.5 K/uL 4.5  5.4  4.9   Hemoglobin 12.0 - 15.0 g/dL 10.0  9.6  9.3   Hematocrit 36.0 - 46.0 % 29.7  28.2  28.2   Platelets 150 - 400 K/uL 190  248  223         Latest Ref Rng & Units 01/21/2022    1:58 PM 11/25/2021   10:09 AM 11/20/2021    9:52 AM  CMP  Glucose 70 - 99 mg/dL 94  94  95   BUN 8 - 23 mg/dL '20  11  10   ' Creatinine 0.44 - 1.00 mg/dL 1.31  1.20  1.19   Sodium 135 - 145 mmol/L 140  140  143   Potassium 3.5 - 5.1 mmol/L 3.3  3.3  4.0   Chloride 98 - 111 mmol/L 103  109   112   CO2 22 - 32 mmol/L '31  24  22   ' Calcium 8.9 - 10.3 mg/dL 9.1  8.4  8.6   Total Protein 6.5 - 8.1 g/dL 7.1  6.2  6.4  Total Bilirubin 0.3 - 1.2 mg/dL 0.5  0.5  0.6   Alkaline Phos 38 - 126 U/L 78  74  85   AST 15 - 41 U/L '14  13  12   ' ALT 0 - 44 U/L '8  7  7       ' RADIOGRAPHIC STUDIES: I have personally reviewed the radiological images as listed and agreed with the findings in the report. No results found.    Orders Placed This Encounter  Procedures   MR PELVIS WO CM RECTAL CA STAGING    Standing Status:   Future    Standing Expiration Date:   01/22/2023    Order Specific Question:   If indicated for the ordered procedure, I authorize the administration of contrast media per Radiology protocol    Answer:   Yes    Order Specific Question:   What is the patient's sedation requirement?    Answer:   No Sedation    Order Specific Question:   Does the patient have a pacemaker or implanted devices?    Answer:   No    Order Specific Question:   Preferred imaging location?    Answer:   [facility] (table limit - 550 lbs)   All questions were answered. The patient knows to call the clinic with any problems, questions or concerns. No barriers to learning was detected. The total time spent in the appointment was 30 minutes.     Truitt Merle, MD 01/21/2022

## 2022-01-22 ENCOUNTER — Ambulatory Visit (INDEPENDENT_AMBULATORY_CARE_PROVIDER_SITE_OTHER): Payer: Medicare HMO | Admitting: Podiatry

## 2022-01-22 ENCOUNTER — Encounter: Payer: Self-pay | Admitting: Podiatry

## 2022-01-22 DIAGNOSIS — R6889 Other general symptoms and signs: Secondary | ICD-10-CM | POA: Diagnosis not present

## 2022-01-22 DIAGNOSIS — S92424A Nondisplaced fracture of distal phalanx of right great toe, initial encounter for closed fracture: Secondary | ICD-10-CM | POA: Diagnosis not present

## 2022-01-22 NOTE — Progress Notes (Signed)
Subjective:  Patient ID: Tina Waller, female    DOB: 1940/08/07,  MRN: [id number]  No chief complaint on file.   81 y.o. female presents with the above complaint.  Patient presents with follow-up of right great toe fracture.  She states she is doing a lot better minimal pain she is been wearing surgical shoe.  She denies any other acute complaints.  She is a diabetic   Review of Systems: Negative except as noted in the HPI. Denies N/V/F/Ch.  Past Medical History:  Diagnosis Date   Anemia    Anxiety    Cataract    Depression    Diabetes mellitus without complication ([place])    pt denies   GERD (gastroesophageal reflux disease)    Gout    Headache    Hypertension    Stroke [facility]) 2008   09/14/2018   Tonsillar mass    Type 2 diabetes mellitus ([place]) 09/14/2018   Vertigo    Wears dentures    upper   Wears glasses     Current Outpatient Medications:    acetaminophen (TYLENOL) 500 MG tablet, Take 500-1,000 mg by mouth every 6 (six) hours as needed for moderate pain or headache., Disp: , Rfl:    amLODipine (NORVASC) 10 MG tablet, TAKE 1 TABLET EVERY DAY (Patient taking differently: Take 10 mg by mouth daily.), Disp: 90 tablet, Rfl: 0   capecitabine (XELODA) 500 MG tablet, Take 2 tablets (1,000 mg total) by mouth every 12 (twelve) hours. Take within 30 minutes after meals. Take on days of radiation, Monday through Friday. (Patient not taking: Reported on 01/07/2022), Disp: 60 tablet, Rfl: 1   cholecalciferol (VITAMIN D3) 25 MCG (1000 UNIT) tablet, Take 2 tablets (2,000 Units total) by mouth daily., Disp: 100 tablet, Rfl: 1   clopidogrel (PLAVIX) 75 MG tablet, TAKE 1 TABLET (75 MG TOTAL) BY MOUTH DAILY. (Patient not taking: Reported on 01/07/2022), Disp: 90 tablet, Rfl: 0   colchicine 0.6 MG tablet, Take 1 tablet (0.6 mg total) by mouth daily. (Patient not taking: Reported on 01/07/2022), Disp: 14 tablet, Rfl: 0   ferrous gluconate (FERGON) 324 MG tablet, Take 1 tablet (324 mg total) by  mouth daily with breakfast., Disp: 30 tablet, Rfl: 0   furosemide (LASIX) 40 MG tablet, TAKE 1 TABLET (40 MG TOTAL) BY MOUTH 2 (TWO) TIMES DAILY., Disp: 180 tablet, Rfl: 0   isosorbide mononitrate (IMDUR) 60 MG 24 hr tablet, Take 1.5 tablets (90 mg total) by mouth daily., Disp: 135 tablet, Rfl: 3   levalbuterol (XOPENEX HFA) 45 MCG/ACT inhaler, Inhale into the lungs. (Patient not taking: Reported on 01/07/2022), Disp: , Rfl:    metoprolol succinate (TOPROL-XL) 100 MG 24 hr tablet, Take 1 tablet (100 mg total) by mouth daily. Take with or immediately following a meal., Disp: 30 tablet, Rfl: 0   mometasone (ELOCON) 0.1 % ointment, Apply topically daily. (Patient taking differently: Apply topically daily. As needed), Disp: 45 g, Rfl: 0   Multiple Vitamin (MULTIVITAMIN WITH MINERALS) TABS tablet, Take 1 tablet by mouth daily., Disp: 30 tablet, Rfl: 0   ondansetron (ZOFRAN) 4 MG tablet, Take 1 tablet (4 mg total) by mouth every 8 (eight) hours as needed for nausea or vomiting. (Patient not taking: Reported on 01/07/2022), Disp: 20 tablet, Rfl: 0   polyethylene glycol (MIRALAX) packet, Take 17 g by mouth daily as needed for mild constipation. (Patient not taking: Reported on 01/07/2022), Disp: , Rfl:    potassium chloride (KLOR-CON M) 10 MEQ tablet, Take  1 tablet (10 mEq total) by mouth 2 (two) times daily. (Patient not taking: Reported on 01/07/2022), Disp: 14 tablet, Rfl: 0   pravastatin (PRAVACHOL) 80 MG tablet, TAKE 1 TABLET EVERY DAY FOR HIGH CHOLESTEROL (Patient taking differently: Take 80 mg by mouth daily.), Disp: 90 tablet, Rfl: 0   SPIRIVA RESPIMAT 2.5 MCG/ACT AERS, , Disp: , Rfl:    traZODone (DESYREL) 50 MG tablet, Take 50 mg by mouth at bedtime., Disp: , Rfl:    vitamin B-12 (CYANOCOBALAMIN) 1000 MCG tablet, Take 1,000 mcg by mouth daily., Disp: , Rfl:   Social History   Tobacco Use  Smoking Status Former   Types: Cigarettes   Quit date: 07/14/2006   Years since quitting: 15.5  Smokeless  Tobacco Never    Allergies  Allergen Reactions   Lisinopril Cough   Carvedilol Diarrhea   Hydralazine     Vomiting and diarrhea   Lipitor [Atorvastatin] Diarrhea   Objective:  There were no vitals filed for this visit. There is no height or weight on file to calculate BMI. Constitutional Well developed. Well nourished.  Vascular Dorsalis pedis pulses palpable bilaterally. Posterior tibial pulses palpable bilaterally. Capillary refill normal to all digits.  No cyanosis or clubbing noted. Pedal hair growth normal.  Neurologic Normal speech. Oriented to person, place, and time. Epicritic sensation to light touch grossly present bilaterally.  Dermatologic Nails well groomed and normal in appearance. No open wounds. No skin lesions.  Orthopedic: No further pain on palpation to the right base of the great toe at the IPJ joint.  No further n with range of motion of the IPJ.  No pain at the metatarsophalangeal joint.  No ecchymosis noted no open wounds or lesion noted.  No signs of infection noted.   Radiographs: Prior x-rays were reviewed appears the patient has a small well aligned nondisplaced fracture of the great phalanx that could be trial intra-articular.  No other breaks noted no other abnormalities noted. Assessment:   No diagnosis found.  Plan:  Patient was evaluated and treated and all questions answered.  Right distal phalanx fracture of the great toe.  Minimally displaced -All questions and concerns were discussed with the patient in extensive detail. -Clinically healed and her pain has resolved considerably.  At this time any foot and ankle issues arise in future have asked her to come back and see me I discussed shoe gear modification.  She states understanding  Return in about 3 months (around 04/24/2022).

## 2022-01-23 ENCOUNTER — Telehealth: Payer: Self-pay

## 2022-01-23 NOTE — Telephone Encounter (Signed)
I spoke to [employer] and she confirmed receipt of the referral.  It is being processed. Eligibility has not been determined yet.

## 2022-02-03 ENCOUNTER — Other Ambulatory Visit: Payer: Self-pay | Admitting: Internal Medicine

## 2022-02-03 ENCOUNTER — Encounter: Payer: Self-pay | Admitting: Gastroenterology

## 2022-02-03 DIAGNOSIS — I10 Essential (primary) hypertension: Secondary | ICD-10-CM

## 2022-02-03 DIAGNOSIS — E7800 Pure hypercholesterolemia, unspecified: Secondary | ICD-10-CM

## 2022-02-03 DIAGNOSIS — Z8673 Personal history of transient ischemic attack (TIA), and cerebral infarction without residual deficits: Secondary | ICD-10-CM

## 2022-02-04 NOTE — Telephone Encounter (Signed)
Losartan (COZAAR) discontinued by hospital provider at discharge on 08/21/21, will refuse this medication due to this.   Requested Prescriptions  Pending Prescriptions Disp Refills  . clopidogrel (PLAVIX) 75 MG tablet [Pharmacy Med Name: CLOPIDOGREL 75 MG Tablet] 90 tablet 0    Sig: TAKE 1 TABLET EVERY DAY     Hematology: Antiplatelets - clopidogrel Failed - 02/03/2022  3:16 AM      Failed - HCT in normal range and within 180 days    HCT  Date Value Ref Range Status  01/21/2022 29.7 (L) 36.0 - 46.0 % Final   Hematocrit  Date Value Ref Range Status  04/19/2021 37.5 34.0 - 46.6 % Final         Failed - HGB in normal range and within 180 days    Hemoglobin  Date Value Ref Range Status  01/21/2022 10.0 (L) 12.0 - 15.0 g/dL Final  04/19/2021 13.1 11.1 - 15.9 g/dL Final         Failed - Cr in normal range and within 360 days    Creatinine  Date Value Ref Range Status  01/21/2022 1.31 (H) 0.44 - 1.00 mg/dL Final   Creat  Date Value Ref Range Status  04/21/2016 1.35 (H) 0.60 - 0.93 mg/dL Final    Comment:      For patients > or = 79 years of age: The upper reference limit for Creatinine is approximately 13% higher for people identified as African-American.            Passed - PLT in normal range and within 180 days    Platelets  Date Value Ref Range Status  04/19/2021 232 150 - 450 x10E3/uL Final   Platelet Count  Date Value Ref Range Status  01/21/2022 190 150 - 400 K/uL Final         Passed - Valid encounter within last 6 months    Recent Outpatient Visits          4 weeks ago Essential hypertension   [place], MD   4 months ago Hospital discharge follow-up   [place], MD   9 months ago Need for shingles vaccine   [place], Jarome Matin, RPH-CPP   9 months ago Essential hypertension   [place], MD   11 months ago Essential hypertension   [place], RPH-CPP      Future Appointments            In 1 week Daisy Blossom, Jarome Matin, [place]           . furosemide (LASIX) 40 MG tablet [Pharmacy Med Name: FUROSEMIDE 40 MG Tablet] 180 tablet 0    Sig: TAKE 1 TABLET TWICE DAILY     Cardiovascular:  Diuretics - Loop Failed - 02/03/2022  3:16 AM      Failed - K in normal range and within 180 days    Potassium  Date Value Ref Range Status  01/21/2022 3.3 (L) 3.5 - 5.1 mmol/L Final         Failed - Cr in normal range and within 180 days    Creatinine  Date Value Ref Range Status  01/21/2022 1.31 (H) 0.44 - 1.00 mg/dL Final   Creat  Date Value Ref Range Status  04/21/2016 1.35 (H) 0.60 - 0.93 mg/dL Final    Comment:      For patients > or = 80 years of age: The upper reference limit for Creatinine is approximately 13% higher for people identified as African-American.            Failed - Mg Level in normal range and within 180 days    Magnesium  Date Value Ref Range Status  09/13/2018 1.8 1.7 - 2.4 mg/dL Final    Comment:    Performed at [facility], [place] [address] [place], [place] [address]         Failed - Last BP in normal range    BP Readings from Last 1 Encounters:  01/21/22 (!) 187/87         Passed - Ca in normal range and within 180 days    Calcium  Date Value Ref Range Status  01/21/2022 9.1 8.9 - 10.3 mg/dL Final   Calcium, Ion  Date Value Ref Range Status  10/21/2017 1.06 (L) 1.15 - 1.40 mmol/L Final         Passed - Na in normal range and within 180 days    Sodium  Date Value Ref Range Status  01/21/2022 140 135 - 145 mmol/L Final  04/19/2021 142 134 - 144 mmol/L Final         Passed - Cl in normal range and within 180 days    Chloride  Date Value Ref Range Status  01/21/2022 103 98 - 111 mmol/L Final          Passed - Valid encounter within last 6 months    Recent Outpatient Visits          4 weeks ago Essential hypertension   [place], MD   4 months ago Hospital discharge follow-up   [place], MD   9 months ago Need for shingles vaccine   [place], Jarome Matin, RPH-CPP   9 months ago Essential hypertension   [place], Deborah B, MD   11 months ago Essential hypertension   [place], RPH-CPP      Future Appointments            In 1 week Daisy Blossom, Jarome Matin, [place]           . metoprolol succinate (TOPROL-XL) 50 MG 24 hr tablet [Pharmacy Med Name: METOPROLOL SUCCINATE ER 50 MG Tablet Extended Release 24 Hour] 180 tablet     Sig: TAKE 2 TABLETS EVERY DAY     Cardiovascular:  Beta Blockers Failed - 02/03/2022  3:16 AM      Failed - Last BP in normal range    BP Readings from Last 1 Encounters:  01/21/22 (!) 187/87         Passed - Last Heart Rate in normal range    Pulse Readings from Last 1 Encounters:  01/21/22 69         Passed - Valid encounter within last 6 months    Recent Outpatient Visits          4 weeks ago Essential hypertension   [place], MD   4 months ago Hospital discharge follow-up   [facility] [name], MD   9 months ago Need for shingles vaccine   [place], Jarome Matin, RPH-CPP   9 months ago Essential hypertension   [place], MD   11 months ago Essential hypertension   [place], RPH-CPP      Future Appointments            In  1 week Daisy Blossom, Jarome Matin, [place]           . pravastatin (PRAVACHOL) 80 MG tablet [Pharmacy Med Name: PRAVASTATIN SODIUM 80 MG Tablet] 90 tablet 0    Sig: TAKE 1 TABLET EVERY DAY FOR HIGH CHOLESTEROL     Cardiovascular:  Antilipid - Statins Failed - 02/03/2022  3:16 AM      Failed - Lipid Panel in normal range within the last 12 months    Cholesterol, Total  Date Value Ref Range Status  07/26/2020 166 100 - 199 mg/dL Final   LDL Chol Calc (NIH)  Date Value Ref Range Status  07/26/2020 91 0 - 99 mg/dL Final   HDL  Date Value Ref Range Status  07/26/2020 53 >39 mg/dL Final   Triglycerides  Date Value Ref Range Status  07/26/2020 122 0 - 149 mg/dL Final         Passed - Patient is not pregnant      Passed - Valid encounter within last 12 months    Recent Outpatient Visits          4 weeks ago Essential hypertension   [place], MD   4 months ago Hospital discharge follow-up   [place], MD   9 months ago Need for shingles vaccine   [place], Jarome Matin, RPH-CPP   9 months ago Essential hypertension   [place], Deborah B, MD   11 months ago Essential hypertension   [place], RPH-CPP      Future Appointments            In 1 week Daisy Blossom, Jarome Matin, [place]           . losartan (COZAAR) 100 MG tablet [Pharmacy Med Name: LOSARTAN POTASSIUM 100 MG Tablet] 90 tablet     Sig: TAKE 1 TABLET EVERY DAY     Cardiovascular:  Angiotensin Receptor Blockers Failed - 02/03/2022  3:16 AM      Failed - Cr in normal range and within 180 days    Creatinine  Date Value Ref Range Status  01/21/2022 1.31 (H) 0.44 - 1.00 mg/dL Final   Creat  Date  Value Ref Range Status  04/21/2016 1.35 (H) 0.60 - 0.93 mg/dL Final    Comment:      For patients > or = 79 years of age: The upper reference limit for Creatinine is approximately 13% higher for people identified as African-American.            Failed - K in normal range and within 180 days    Potassium  Date Value Ref Range Status  01/21/2022 3.3 (L) 3.5 - 5.1 mmol/L Final         Failed -  Last BP in normal range    BP Readings from Last 1 Encounters:  01/21/22 (!) 187/87         Passed - Patient is not pregnant      Passed - Valid encounter within last 6 months    Recent Outpatient Visits          4 weeks ago Essential hypertension   [place], MD   4 months ago Hospital discharge follow-up   [place], MD   9 months ago Need for shingles vaccine   [place], Jarome Matin, RPH-CPP   9 months ago Essential hypertension   [place], Deborah B, MD   11 months ago Essential hypertension   [place], RPH-CPP      Future Appointments            In 1 week Daisy Blossom, Jarome Matin, [place]

## 2022-02-04 NOTE — Telephone Encounter (Signed)
Requested medication (s) are due for refill today:Yes  Requested medication (s) are on the active medication list: Yes  Last refill:  08/08/21  Future visit scheduled: No  Notes to clinic:  Unable to refill per protocol due to failed labs, no updated results.      Requested Prescriptions  Pending Prescriptions Disp Refills   pravastatin (PRAVACHOL) 80 MG tablet [Pharmacy Med Name: PRAVASTATIN SODIUM 80 MG Tablet] 90 tablet 0    Sig: TAKE 1 TABLET EVERY DAY FOR HIGH CHOLESTEROL     Cardiovascular:  Antilipid - Statins Failed - 02/03/2022  3:16 AM      Failed - Lipid Panel in normal range within the last 12 months    Cholesterol, Total  Date Value Ref Range Status  07/26/2020 166 100 - 199 mg/dL Final   LDL Chol Calc (NIH)  Date Value Ref Range Status  07/26/2020 91 0 - 99 mg/dL Final   HDL  Date Value Ref Range Status  07/26/2020 53 >39 mg/dL Final   Triglycerides  Date Value Ref Range Status  07/26/2020 122 0 - 149 mg/dL Final         Passed - Patient is not pregnant      Passed - Valid encounter within last 12 months    Recent Outpatient Visits           4 weeks ago Essential hypertension   [place], Dalbert [place], MD   4 months ago Hospital discharge follow-up   [place] [name], MD   9 months ago Need for shingles vaccine   [place], Stephen L, RPH-CPP   9 months ago Essential hypertension   [place], Deborah B, MD   11 months ago Essential hypertension   [place], Stephen L, RPH-CPP       Future Appointments             In 1 week Daisy Blossom, Jarome Matin, [place]            Signed Prescriptions Disp Refills   clopidogrel (PLAVIX) 75 MG tablet 90 tablet 1    Sig: TAKE 1 TABLET EVERY DAY      Hematology: Antiplatelets - clopidogrel Failed - 02/03/2022  3:16 AM      Failed - HCT in normal range and within 180 days    HCT  Date Value Ref Range Status  01/21/2022 29.7 (L) 36.0 - 46.0 % Final   Hematocrit  Date Value Ref Range Status  04/19/2021 37.5 34.0 - 46.6 % Final         Failed - HGB in normal range and within 180 days    Hemoglobin  Date Value Ref Range Status  01/21/2022 10.0 (L) 12.0 - 15.0 g/dL Final  04/19/2021 13.1 11.1 - 15.9 g/dL Final         Failed - Cr in normal range and within 360 days    Creatinine  Date Value Ref Range Status  01/21/2022 1.31 (H) 0.44 - 1.00 mg/dL Final   Creat  Date Value Ref Range Status  04/21/2016 1.35 (H) 0.60 - 0.93 mg/dL Final    Comment:      For patients > or = 79 years of age: The upper reference limit for Creatinine is approximately 13% higher for people identified as African-American.  Passed - PLT in normal range and within 180 days    Platelets  Date Value Ref Range Status  04/19/2021 232 150 - 450 x10E3/uL Final   Platelet Count  Date Value Ref Range Status  01/21/2022 190 150 - 400 K/uL Final         Passed - Valid encounter within last 6 months    Recent Outpatient Visits           4 weeks ago Essential hypertension   [place], Deborah B, MD   4 months ago Hospital discharge follow-up   [place], MD   9 months ago Need for shingles vaccine   [place], RPH-CPP   9 months ago Essential hypertension   [place], MD   11 months ago Essential hypertension   [place], Stephen L, RPH-CPP       Future Appointments             In 1 week Daisy Blossom, Jarome Matin, [place]             furosemide (LASIX)  40 MG tablet 180 tablet 1    Sig: TAKE 1 TABLET TWICE DAILY     Cardiovascular:  Diuretics - Loop Failed - 02/03/2022  3:16 AM      Failed - K in normal range and within 180 days    Potassium  Date Value Ref Range Status  01/21/2022 3.3 (L) 3.5 - 5.1 mmol/L Final         Failed - Cr in normal range and within 180 days    Creatinine  Date Value Ref Range Status  01/21/2022 1.31 (H) 0.44 - 1.00 mg/dL Final   Creat  Date Value Ref Range Status  04/21/2016 1.35 (H) 0.60 - 0.93 mg/dL Final    Comment:      For patients > or = 79 years of age: The upper reference limit for Creatinine is approximately 13% higher for people identified as African-American.            Failed - Mg Level in normal range and within 180 days    Magnesium  Date Value Ref Range Status  09/13/2018 1.8 1.7 - 2.4 mg/dL Final    Comment:    Performed at [facility], [place] [address] [place], [place] [address]         Failed - Last BP in normal range    BP Readings from Last 1 Encounters:  01/21/22 (!) 187/87         Passed - Ca in normal range and within 180 days    Calcium  Date Value Ref Range Status  01/21/2022 9.1 8.9 - 10.3 mg/dL Final   Calcium, Ion  Date Value Ref Range Status  10/21/2017 1.06 (L) 1.15 - 1.40 mmol/L Final         Passed - Na in normal range and within 180 days    Sodium  Date Value Ref Range Status  01/21/2022 140 135 - 145 mmol/L Final  04/19/2021 142 134 - 144 mmol/L Final         Passed - Cl in normal range and within 180 days    Chloride  Date Value Ref Range Status  01/21/2022 103 98 - 111 mmol/L Final  Passed - Valid encounter within last 6 months    Recent Outpatient Visits           4 weeks ago Essential hypertension   [place], MD   4 months ago Hospital discharge follow-up   [place], MD   9 months ago Need for shingles vaccine    [place], RPH-CPP   9 months ago Essential hypertension   [place], MD   11 months ago Essential hypertension   [place], Stephen L, RPH-CPP       Future Appointments             In 1 week Daisy Blossom, Jarome Matin, [place]             metoprolol succinate (TOPROL-XL) 50 MG 24 hr tablet 180 tablet 1    Sig: TAKE 2 TABLETS EVERY DAY     Cardiovascular:  Beta Blockers Failed - 02/03/2022  3:16 AM      Failed - Last BP in normal range    BP Readings from Last 1 Encounters:  01/21/22 (!) 187/87         Passed - Last Heart Rate in normal range    Pulse Readings from Last 1 Encounters:  01/21/22 69         Passed - Valid encounter within last 6 months    Recent Outpatient Visits           4 weeks ago Essential hypertension   [place], Deborah B, MD   4 months ago Hospital discharge follow-up   [place], MD   9 months ago Need for shingles vaccine   [place], RPH-CPP   9 months ago Essential hypertension   [place], MD   11 months ago Essential hypertension   [place], RPH-CPP       Future Appointments             In 1 week Daisy Blossom, Jarome Matin, [place]            Refused Prescriptions Disp Refills   losartan (COZAAR) 100 MG tablet [Pharmacy Med Name: LOSARTAN POTASSIUM 100 MG Tablet] 90 tablet     Sig: TAKE 1 TABLET EVERY DAY     Cardiovascular:  Angiotensin Receptor Blockers Failed - 02/03/2022  3:16 AM      Failed - Cr in normal range and within 180 days    Creatinine   Date Value Ref Range Status  01/21/2022 1.31 (H) 0.44 - 1.00 mg/dL Final   Creat  Date Value Ref Range Status  04/21/2016 1.35 (H) 0.60 - 0.93 mg/dL Final    Comment:      For patients > or = 79 years of age: The upper reference limit for Creatinine is approximately 13% higher for people identified as African-American.            Failed - K in normal range and within 180 days    Potassium  Date Value Ref Range Status  01/21/2022 3.3 (L) 3.5 -  5.1 mmol/L Final         Failed - Last BP in normal range    BP Readings from Last 1 Encounters:  01/21/22 (!) 187/87         Passed - Patient is not pregnant      Passed - Valid encounter within last 6 months    Recent Outpatient Visits           4 weeks ago Essential hypertension   [place], MD   4 months ago Hospital discharge follow-up   [place], MD   9 months ago Need for shingles vaccine   [place], Jarome Matin, RPH-CPP   9 months ago Essential hypertension   [place], Deborah B, MD   11 months ago Essential hypertension   [place], RPH-CPP       Future Appointments             In 1 week Daisy Blossom, Jarome Matin, [place]               c

## 2022-02-13 ENCOUNTER — Ambulatory Visit: Payer: Medicare HMO | Attending: Internal Medicine | Admitting: Pharmacist

## 2022-02-13 ENCOUNTER — Encounter: Payer: Self-pay | Admitting: Pharmacist

## 2022-02-13 VITALS — BP 182/83 | HR 74

## 2022-02-13 DIAGNOSIS — R6889 Other general symptoms and signs: Secondary | ICD-10-CM | POA: Diagnosis not present

## 2022-02-13 DIAGNOSIS — I10 Essential (primary) hypertension: Secondary | ICD-10-CM | POA: Diagnosis not present

## 2022-02-13 MED ORDER — LABETALOL HCL 100 MG PO TABS: 100.0000 mg | ORAL_TABLET | Freq: Two times a day (BID) | ORAL | 0 refills | Status: DC

## 2022-02-13 NOTE — Progress Notes (Signed)
S:    PCP: Dr. Wynetta Emery  Patient in good spirits. Presents to the clinic for hypertension management. Patient was referred by Dr. Wynetta Emery on 01/07/2022.   Today, pt reports adherence with medications. She has taken all today. Of note, she is still taking the losartan. There was confusion concerning this at her last PCP visit. She is compliant with this along with amlodipine, isosorbide, furosemide, and metoprolol.   Current BP Medications include: amlodipine 10 mg, isosorbide mononitrate '60mg'$  daily, Losartan 100 mg, furosemide 40 BID,Toprol XL 100 mg daily  Dietary habits include: drinks 1 cup of black tea daily, denies using salt Exercise habits include: doesn't exercise Family / Social history: former smoker (quit in 2008); has 1-2 drinks over the weekend  O:  Vitals:   02/13/22 1518  BP: (!) 182/83  Pulse: 74   Home BP readings:   BP values at home since her last encounter with me:  SBP range: 128 - 177 DBP range: 67 - 99  Last 3 office BP readings: BP Readings from Last 3 Encounters:  02/13/22 (!) 182/83  01/21/22 (!) 187/87  01/07/22 (!) 160/90   BMET    Component Value Date/Time   NA 140 01/21/2022 1358   NA 142 04/19/2021 1459   K 3.3 (L) 01/21/2022 1358   CL 103 01/21/2022 1358   CO2 31 01/21/2022 1358   GLUCOSE 94 01/21/2022 1358   BUN 20 01/21/2022 1358   BUN 37 (H) 04/19/2021 1459   CREATININE 1.31 (H) 01/21/2022 1358   CREATININE 1.35 (H) 04/21/2016 1437   CALCIUM 9.1 01/21/2022 1358   GFRNONAA 42 (L) 01/21/2022 1358   GFRNONAA 39 (L) 04/21/2016 1437   GFRAA 41 (L) 07/26/2020 1503   GFRAA 45 (L) 04/21/2016 1437   Renal function: CrCl cannot be calculated (Patient's most recent lab result is older than the maximum 21 days allowed.).  Clinical ASCVD: Yes  The 10-year ASCVD risk score (Arnett DK, et al., 2019) is: 44.8%   Values used to calculate the score:     Age: 89 years     Sex: Female     Is Non-Hispanic African American: Yes     Diabetic:  Yes     Tobacco smoker: No     Systolic Blood Pressure: 010 mmHg     Is BP treated: Yes     HDL Cholesterol: 53 mg/dL     Total Cholesterol: 166 mg/dL  A/P: Hypertension longstanding. Her pressures are a little better overall at home. She stays consistently elevated in clinic. BP goal <130/80 mmHg.   She has tried some other antihypertensives in the past. She has a hx of an elevated uric acid and has developed hyponatremia in the past with thiazides. Her creatinine has increased very mildly since 11/2021 (1.19>>1.2>>1.31). I think it's okay for her to remain on the losartan so long as her renal function stays stable. I will hold off on changing this to a more potent ARB or spironolactone today. She is already on a CCB. She has intolerances to hydralazine. She has not tolerated carvedilol in the past but I recommend to change metoprolol to labetalol for potential BP control.   - Stop metoprolol.  - Start labetalol 100 mg BID. Education given.  - Continue all other antihypertensives at current doses.  - Counseled on lifestyle modifications for BP control including reduced dietary sodium, increased exercise, adequate sleep.  Results reviewed and written information provided. Total time counseling 15 minutes.   F/U with PCP.  Benard Halsted, PharmD, [place], [place] [phone]

## 2022-02-18 DIAGNOSIS — R6889 Other general symptoms and signs: Secondary | ICD-10-CM | POA: Diagnosis not present

## 2022-02-18 DIAGNOSIS — C20 Malignant neoplasm of rectum: Secondary | ICD-10-CM | POA: Diagnosis not present

## 2022-02-24 ENCOUNTER — Inpatient Hospital Stay: Payer: Medicare HMO | Attending: Hematology

## 2022-02-24 ENCOUNTER — Ambulatory Visit (HOSPITAL_COMMUNITY)
Admission: RE | Admit: 2022-02-24 | Discharge: 2022-02-24 | Disposition: A | Discharge status: Home / Self Care | Payer: Medicare HMO | Source: Ambulatory Visit | Attending: Hematology | Admitting: Hematology

## 2022-02-24 DIAGNOSIS — C20 Malignant neoplasm of rectum: Secondary | ICD-10-CM | POA: Diagnosis not present

## 2022-02-24 DIAGNOSIS — D492 Neoplasm of unspecified behavior of bone, soft tissue, and skin: Secondary | ICD-10-CM | POA: Diagnosis not present

## 2022-02-27 ENCOUNTER — Telehealth: Payer: Self-pay | Admitting: Hematology

## 2022-02-27 NOTE — Telephone Encounter (Signed)
I called patient and left a voice message.  Her pelvic MRI showed residual T2 rectal cancer.  Given the suspicion of residual disease on MRI, we recommend a flexible sigmoidoscopy for further evaluation and biopsy as needed.  I have discussed with her surgeon Dr. Marcello Moores and GI Dr. Rush Landmark, Dr. Rush Landmark will arrange her procedure.  I left patient a message about the above plan, and encouraged her to call me back if she has not further questions.  Truitt Merle  02/27/2022

## 2022-03-04 ENCOUNTER — Telehealth: Payer: Self-pay

## 2022-03-04 NOTE — Telephone Encounter (Signed)
-----   Message from Irving Copas., MD sent at 03/04/2022  5:33 AM EDT ----- Tina Waller, No problem.  We can get her in.  Tina Waller, Please schedule this patient for flexible sigmoidoscopy next available with me. Please update Dr. Burr Medico and Dr. Marcello Moores and I when she is scheduled. This can be done in the [place]. Thanks. GM ----- Message ----- From: Truitt Merle, MD Sent: 02/27/2022   9:74 AM EDT To: Leighton Ruff, MD; Irving Copas., MD; #  Gabe, could you get her in for flex sigmoidoscopy? Thanks much.  Truitt Merle  ----- Message ----- From: Irving Copas., MD Sent: 02/25/2022   1:63 PM EDT To: Leighton Ruff, MD; Truitt Merle, MD; #  Hi team, Happy to be available as needed. Let me know and I can work on arranging in the next few weeks if needed. GM ----- Message ----- From: Truitt Merle, MD Sent: 02/25/2022   8:45 PM EDT To: Leighton Ruff, MD; Irving Copas., MD; #  Elmo Putt,   Please review her recent pelvic MRI. It appears she still has residual disease on MRI, although you did not see anything on anoscopy. Do you want Gabe to do a flex sig if she agrees?  Krista Blue  ----- Message ----- From: Buel Ream, Rad Results In Sent: 02/25/2022   1:33 PM EDT To: Truitt Merle, MD

## 2022-03-04 NOTE — Telephone Encounter (Signed)
Noted  

## 2022-03-04 NOTE — Telephone Encounter (Signed)
Pt called back.  PV scheduled for 8/30 at 3:00 p.m. // Flex Sig scheduled for 9/13 at 11:00 a.m.

## 2022-03-04 NOTE — Telephone Encounter (Signed)
Called patient to schedule left voicemail. 

## 2022-03-04 NOTE — Telephone Encounter (Signed)
Tina Waller can you please help schedule this pt previsit and flex with Dr Rush Landmark in the [facility]? I am having to bring back this afternoon and primary all day on Thursday.  I am getting a little overwhelmed.  I really appreciate it.

## 2022-03-11 ENCOUNTER — Telehealth: Payer: Self-pay | Admitting: *Deleted

## 2022-03-11 ENCOUNTER — Other Ambulatory Visit: Payer: Self-pay | Admitting: Internal Medicine

## 2022-03-11 DIAGNOSIS — I10 Essential (primary) hypertension: Secondary | ICD-10-CM

## 2022-03-11 NOTE — Telephone Encounter (Signed)
Please see note. Please obtain Plavix hold. Thank you. Peony Barner PV

## 2022-03-11 NOTE — Telephone Encounter (Signed)
Requested medication (s) are due for refill today: Labetalol is  Requested medication (s) are on the active medication list: Labetalol is, See note below about Losartan  Last refill:  Labetalol 02/13/22 #60 with 0 RF (sending to you because only given one month supply 02/13/22 even though just seen in June.  Future visit scheduled: no, just seen 01/07/22  Notes to clinic:  OV note from 01/07/22 states that pt still taking Losartan even though it had been discontinued. Note states if she is still taking it to keep on taking it. It is not on current med list. Please assess.      Requested Prescriptions  Pending Prescriptions Disp Refills   labetalol (NORMODYNE) 100 MG tablet [Pharmacy Med Name: LABETALOL HYDROCHLORIDE 100 MG Tablet] 60 tablet 0    Sig: TAKE 1 TABLET TWICE DAILY     Cardiovascular:  Beta Blockers Failed - 03/11/2022  1:17 PM      Failed - Last BP in normal range    BP Readings from Last 1 Encounters:  02/13/22 (!) 182/83         Passed - Last Heart Rate in normal range    Pulse Readings from Last 1 Encounters:  02/13/22 74         Passed - Valid encounter within last 6 months    Recent Outpatient Visits           3 weeks ago Essential hypertension   [place], Jarome Matin, RPH-CPP   2 months ago Essential hypertension   [place], Deborah B, MD   5 months ago Hospital discharge follow-up   [place], MD   10 months ago Need for shingles vaccine   [place], RPH-CPP   10 months ago Essential hypertension   [place], MD       Future Appointments             In 1 week Daisy Blossom, Jarome Matin, [place]             losartan (COZAAR) 100 MG tablet [Pharmacy Med Name: LOSARTAN  POTASSIUM 100 MG Tablet] 90 tablet 0    Sig: TAKE 1 TABLET EVERY DAY     Cardiovascular:  Angiotensin Receptor Blockers Failed - 03/11/2022  1:17 PM      Failed - Cr in normal range and within 180 days    Creatinine  Date Value Ref Range Status  01/21/2022 1.31 (H) 0.44 - 1.00 mg/dL Final   Creat  Date Value Ref Range Status  04/21/2016 1.35 (H) 0.60 - 0.93 mg/dL Final    Comment:      For patients > or = 79 years of age: The upper reference limit for Creatinine is approximately 13% higher for people identified as African-American.            Failed - K in normal range and within 180 days    Potassium  Date Value Ref Range Status  01/21/2022 3.3 (L) 3.5 - 5.1 mmol/L Final         Failed - Last BP in normal range    BP Readings from Last 1 Encounters:  02/13/22 (!) 182/83         Passed - Patient is not pregnant      Passed -  Valid encounter within last 6 months    Recent Outpatient Visits           3 weeks ago Essential hypertension   [place], RPH-CPP   2 months ago Essential hypertension   [place], MD   5 months ago Hospital discharge follow-up   [place], MD   10 months ago Need for shingles vaccine   [place], Jarome Matin, RPH-CPP   10 months ago Essential hypertension   [place], MD       Future Appointments             In 1 week Daisy Blossom, Jarome Matin, [place]

## 2022-03-11 NOTE — Telephone Encounter (Signed)
Dr.Mansouraty,  This patient is for a flex sig on 9/13 at [facility]. Per her medication list she is currently taking Plavix. Does she need to hold this for the procedure? Please advise. Thank you, Rosalita Carey PV

## 2022-03-11 NOTE — Telephone Encounter (Signed)
The letter has been sent to the prescriber to get the anti coag hold.

## 2022-03-11 NOTE — Telephone Encounter (Signed)
If there is no contraindication by her prescribing provider, yes Plavix being held would be ideal.  If there is concern about holding this, a diagnostic flexible sigmoidoscopy on Plavix could be considered. Thank you. GM

## 2022-03-12 ENCOUNTER — Telehealth: Payer: Self-pay | Admitting: *Deleted

## 2022-03-12 ENCOUNTER — Ambulatory Visit (AMBULATORY_SURGERY_CENTER): Payer: Medicare HMO | Admitting: *Deleted

## 2022-03-12 VITALS — Ht 69.0 in | Wt 118.2 lb

## 2022-03-12 DIAGNOSIS — C20 Malignant neoplasm of rectum: Secondary | ICD-10-CM

## 2022-03-12 NOTE — Telephone Encounter (Signed)
Pt here for PV at 1532

## 2022-03-12 NOTE — Telephone Encounter (Signed)
Patty,  This pt is being done at the hospital 04-17-22 instead

## 2022-03-12 NOTE — Telephone Encounter (Signed)
1514- pt not here for appt.  Attempted to reach her by phone to see if she would like to do it virtually.  LMOM to call office back by 5:00pm

## 2022-03-12 NOTE — Progress Notes (Signed)
While doing pt's PV, she states she wears oxygen every night  She is unsure why she wears it, but does. I spoke with Dr. Rush Landmark and explained situation.  He is ok doing procedure at [facility] directly.  [facility] appt cancelled; [facility] appt made for 04-17-22 at 1:45 PM, arrival at 12:15 PM. Pt informed we will call her regarding her Plavix hold.  No trouble with anesthesia, denies being told they were difficult to intubate, or hx/fam hx of malignant hyperthermia per pt   No egg or soy allergy  No home oxygen use   No medications for weight loss taken

## 2022-03-18 ENCOUNTER — Ambulatory Visit: Payer: Medicare HMO | Admitting: Pharmacist

## 2022-03-25 NOTE — Telephone Encounter (Signed)
Patty,  I'm just following up on Plavix hold. Please let PV know when you get this hold. Thanks! Deshanti Adcox

## 2022-03-25 NOTE — Telephone Encounter (Signed)
I have resent the letter to Dr Wynetta Emery for [place] hold.

## 2022-03-26 ENCOUNTER — Other Ambulatory Visit: Payer: Medicare HMO | Admitting: Gastroenterology

## 2022-04-07 ENCOUNTER — Telehealth: Payer: Self-pay

## 2022-04-07 NOTE — Telephone Encounter (Addendum)
Thank you Patty and Solon Palm for letting me know. Dr. Wynetta Emery or Dr. Durenda Age team, we need to see if we can get approval for Plavix hold for her upcoming procedure on 10/5. Please let us know your thoughts about holding the medication. Thanks. GM

## 2022-04-07 NOTE — Telephone Encounter (Signed)
Call placed to the pt - message left.    Letter also sent to Dr Wynetta Emery in basket and faxed to her office.

## 2022-04-08 ENCOUNTER — Telehealth: Payer: Self-pay

## 2022-04-08 NOTE — Telephone Encounter (Signed)
DBJ, Thanks again for the follow-up. GM

## 2022-04-08 NOTE — Telephone Encounter (Signed)
Pt has an endoscopy procedure on 04/17/2022 they are needing to know how long she can hold her Plavix  Can you type a letter and I will fax it over.

## 2022-04-08 NOTE — Telephone Encounter (Signed)
The pt returned call has been advised of the plavix hold for 5 days prior to the procedure.

## 2022-04-08 NOTE — Telephone Encounter (Signed)
Left message on machine to call back   Dr Wynetta Emery I will call the pt and make her aware

## 2022-04-09 ENCOUNTER — Encounter: Payer: Self-pay | Admitting: Gastroenterology

## 2022-04-10 ENCOUNTER — Encounter (HOSPITAL_COMMUNITY): Payer: Self-pay | Admitting: Gastroenterology

## 2022-04-10 NOTE — Progress Notes (Signed)
Chart sent to [place], [place] for review due to history of O2 at hs, HTN, CVA, CKD, thrombocytopenia, on Plavix

## 2022-04-17 ENCOUNTER — Other Ambulatory Visit: Payer: Self-pay

## 2022-04-17 ENCOUNTER — Ambulatory Visit (HOSPITAL_BASED_OUTPATIENT_CLINIC_OR_DEPARTMENT_OTHER): Payer: Medicare HMO | Admitting: Physician Assistant

## 2022-04-17 ENCOUNTER — Ambulatory Visit (HOSPITAL_COMMUNITY)
Admission: RE | Admit: 2022-04-17 | Discharge: 2022-04-17 | Disposition: A | Discharge status: Home / Self Care | Payer: Medicare HMO | Attending: Gastroenterology | Admitting: Gastroenterology

## 2022-04-17 ENCOUNTER — Encounter (HOSPITAL_COMMUNITY): Payer: Self-pay | Admitting: Gastroenterology

## 2022-04-17 ENCOUNTER — Ambulatory Visit (HOSPITAL_COMMUNITY): Payer: Medicare HMO | Admitting: Physician Assistant

## 2022-04-17 ENCOUNTER — Encounter (HOSPITAL_COMMUNITY)
Admission: RE | Disposition: A | Discharge status: Home / Self Care | Payer: Self-pay | Source: Home / Self Care | Attending: Gastroenterology

## 2022-04-17 DIAGNOSIS — I10 Essential (primary) hypertension: Secondary | ICD-10-CM | POA: Diagnosis not present

## 2022-04-17 DIAGNOSIS — E119 Type 2 diabetes mellitus without complications: Secondary | ICD-10-CM | POA: Diagnosis not present

## 2022-04-17 DIAGNOSIS — K640 First degree hemorrhoids: Secondary | ICD-10-CM | POA: Diagnosis not present

## 2022-04-17 DIAGNOSIS — C20 Malignant neoplasm of rectum: Secondary | ICD-10-CM | POA: Insufficient documentation

## 2022-04-17 DIAGNOSIS — K6289 Other specified diseases of anus and rectum: Secondary | ICD-10-CM

## 2022-04-17 DIAGNOSIS — D490 Neoplasm of unspecified behavior of digestive system: Secondary | ICD-10-CM | POA: Diagnosis not present

## 2022-04-17 DIAGNOSIS — Z85048 Personal history of other malignant neoplasm of rectum, rectosigmoid junction, and anus: Secondary | ICD-10-CM

## 2022-04-17 DIAGNOSIS — K5730 Diverticulosis of large intestine without perforation or abscess without bleeding: Secondary | ICD-10-CM | POA: Diagnosis not present

## 2022-04-17 DIAGNOSIS — K6389 Other specified diseases of intestine: Secondary | ICD-10-CM

## 2022-04-17 DIAGNOSIS — R519 Headache, unspecified: Secondary | ICD-10-CM | POA: Insufficient documentation

## 2022-04-17 DIAGNOSIS — Z87891 Personal history of nicotine dependence: Secondary | ICD-10-CM

## 2022-04-17 DIAGNOSIS — K219 Gastro-esophageal reflux disease without esophagitis: Secondary | ICD-10-CM | POA: Insufficient documentation

## 2022-04-17 DIAGNOSIS — K644 Residual hemorrhoidal skin tags: Secondary | ICD-10-CM | POA: Diagnosis not present

## 2022-04-17 DIAGNOSIS — Z923 Personal history of irradiation: Secondary | ICD-10-CM | POA: Diagnosis not present

## 2022-04-17 DIAGNOSIS — Z9221 Personal history of antineoplastic chemotherapy: Secondary | ICD-10-CM | POA: Insufficient documentation

## 2022-04-17 DIAGNOSIS — R933 Abnormal findings on diagnostic imaging of other parts of digestive tract: Secondary | ICD-10-CM | POA: Insufficient documentation

## 2022-04-17 DIAGNOSIS — Z08 Encounter for follow-up examination after completed treatment for malignant neoplasm: Secondary | ICD-10-CM | POA: Diagnosis not present

## 2022-04-17 HISTORY — PX: BIOPSY: SHX5522

## 2022-04-17 HISTORY — PX: FLEXIBLE SIGMOIDOSCOPY: SHX5431

## 2022-04-17 SURGERY — SIGMOIDOSCOPY, FLEXIBLE
Anesthesia: Monitor Anesthesia Care

## 2022-04-17 MED ORDER — LIDOCAINE 2% (20 MG/ML) 5 ML SYRINGE
INTRAMUSCULAR | Status: DC | PRN
  Administered 2022-04-17: 40 mg via INTRAVENOUS

## 2022-04-17 MED ORDER — PROPOFOL 500 MG/50ML IV EMUL
INTRAVENOUS | Status: DC | PRN
  Administered 2022-04-17: 150 ug/kg/min via INTRAVENOUS

## 2022-04-17 MED ORDER — PROPOFOL 10 MG/ML IV BOLUS
INTRAVENOUS | Status: DC | PRN
  Administered 2022-04-17: 10 mg via INTRAVENOUS
  Administered 2022-04-17 (×4): 20 mg via INTRAVENOUS

## 2022-04-17 MED ORDER — SODIUM CHLORIDE 0.9 % IV SOLN: INTRAVENOUS | Status: DC

## 2022-04-17 MED ORDER — LACTATED RINGERS IV SOLN: INTRAVENOUS | Status: DC | PRN

## 2022-04-17 MED ORDER — CLOPIDOGREL BISULFATE 75 MG PO TABS: 75.0000 mg | ORAL_TABLET | Freq: Every day | ORAL | 1 refills | Status: AC

## 2022-04-17 MED ORDER — LABETALOL HCL 100 MG PO TABS
100.0000 mg | ORAL_TABLET | Freq: Once | ORAL | Status: AC
  Administered 2022-04-17: 100 mg via ORAL
  Filled 2022-04-17: qty 1

## 2022-04-17 MED ORDER — METOPROLOL SUCCINATE ER 50 MG PO TB24
50.0000 mg | ORAL_TABLET | Freq: Once | ORAL | Status: AC
  Administered 2022-04-17: 50 mg via ORAL
  Filled 2022-04-17: qty 1

## 2022-04-17 MED ORDER — LACTATED RINGERS IV SOLN
INTRAVENOUS | Status: DC
Start: 2022-04-17 — End: 2022-04-17
  Administered 2022-04-17: 1000 mL via INTRAVENOUS

## 2022-04-17 NOTE — Anesthesia Postprocedure Evaluation (Signed)
Anesthesia Post Note  Patient: Tina Waller  Procedure(s) Performed: FLEXIBLE SIGMOIDOSCOPY BIOPSY     Patient location during evaluation: Endoscopy Anesthesia Type: MAC Level of consciousness: awake Pain management: pain level controlled Vital Signs Assessment: post-procedure vital signs reviewed and stable Respiratory status: spontaneous breathing Cardiovascular status: stable Postop Assessment: no apparent nausea or vomiting Anesthetic complications: no   No notable events documented.  Last Vitals:  Vitals:   04/17/22 1435 04/17/22 1442  BP:  (!) 217/72  Pulse:  64  Resp:  17  Temp:    SpO2: 99% 98%    Last Pain:  Vitals:   04/17/22 1442  TempSrc:   PainSc: 0-No pain                 Deontay Ladnier

## 2022-04-17 NOTE — H&P (Signed)
GASTROENTEROLOGY PROCEDURE H&P NOTE   Primary Care Physician: [name], MD  HPI: Tina Waller is a 81 y.o. female who presents for flexible sigmoidoscopy for evaluation of previous rectal cancer status post chemo XRT with imaging concerning for recurrence or persistent disease.  Past Medical History:  Diagnosis Date   Anemia    Anxiety    Cancer ([place])    rectal   Cataract    Depression    Diabetes mellitus without complication ([place])    pt denies   GERD (gastroesophageal reflux disease)    Gout    Headache    Hyperlipidemia    Hypertension    Oxygen deficiency    wears oxygen at noc   Stroke [facility] 2008   09/14/2018   Tonsillar mass    Type 2 diabetes mellitus ([place]) 09/14/2018   Vertigo    Wears dentures    upper   Wears glasses    Past Surgical History:  Procedure Laterality Date   ABDOMINAL HYSTERECTOMY     BIOPSY  08/15/2021   Procedure: BIOPSY;  Surgeon: Irving Copas., MD;  Location: [facility] ENDOSCOPY;  Service: Gastroenterology;;   BREAST SURGERY     lumpectomy   BUNIONECTOMY     CATARACT EXTRACTION Left    COLONOSCOPY     COLONOSCOPY W/ BIOPSIES AND POLYPECTOMY     COLONOSCOPY WITH PROPOFOL N/A 08/15/2021   Procedure: COLONOSCOPY WITH PROPOFOL;  Surgeon: Irving Copas., MD;  Location: [place];  Service: Gastroenterology;  Laterality: N/A;   ESOPHAGOGASTRODUODENOSCOPY (EGD) WITH PROPOFOL N/A 08/15/2021   Procedure: ESOPHAGOGASTRODUODENOSCOPY (EGD) WITH PROPOFOL;  Surgeon: Rush Landmark Telford Nab., MD;  Location: [place];  Service: Gastroenterology;  Laterality: N/A;   HEMOSTASIS CLIP PLACEMENT  08/15/2021   Procedure: HEMOSTASIS CLIP PLACEMENT;  Surgeon: Irving Copas., MD;  Location: [place];  Service: Gastroenterology;;   HOT HEMOSTASIS N/A 08/15/2021   Procedure: HOT HEMOSTASIS (ARGON PLASMA COAGULATION/BICAP);  Surgeon: Irving Copas., MD;  Location: [place];  Service: Gastroenterology;   Laterality: N/A;   MULTIPLE TOOTH EXTRACTIONS     POLYPECTOMY  08/15/2021   Procedure: POLYPECTOMY;  Surgeon: Mansouraty, Telford Nab., MD;  Location: [place];  Service: Gastroenterology;;   SUBMUCOSAL TATTOO INJECTION  08/15/2021   Procedure: SUBMUCOSAL TATTOO INJECTION;  Surgeon: Irving Copas., MD;  Location: [place];  Service: Gastroenterology;;   TONGUE BIOPSY Right 12/13/2018   Procedure: RIGHT TONSIL BIOPSY;  Surgeon: Melida Quitter, MD;  Location: [place];  Service: ENT;  Laterality: Right;   UPPER GASTROINTESTINAL ENDOSCOPY     No current facility-administered medications for this encounter.   No current facility-administered medications for this encounter. Allergies  Allergen Reactions   Lisinopril Cough   Carvedilol Diarrhea   Hydralazine     Vomiting and diarrhea   Lipitor [Atorvastatin] Diarrhea   Family History  Problem Relation Age of Onset   Heart disease Mother        CHF   Osteoporosis Neg Hx    Colon cancer Neg Hx    Stomach cancer Neg Hx    Pancreatic cancer Neg Hx    Esophageal cancer Neg Hx    Rectal cancer Neg Hx    Social History   Socioeconomic History   Marital status: Divorced    Spouse name: Not on file   Number of children: Not on file   Years of education: Not on file   Highest education level: Not on file  Occupational History   Not on  file  Tobacco Use   Smoking status: Former    Types: Cigarettes    Quit date: 07/14/2006    Years since quitting: 15.7   Smokeless tobacco: Never  Vaping Use   Vaping Use: Never used  Substance and Sexual Activity   Alcohol use: Not Currently    Alcohol/week: 4.0 standard drinks of alcohol    Types: 4 Shots of liquor per week    Comment: none since Jan   Drug use: No   Sexual activity: Not Currently  Other Topics Concern   Not on file  Social History Narrative   Not on file   Social Determinants of Health   Financial Resource Strain: Not on file  Food Insecurity: Not on file   Transportation Needs: Not on file  Physical Activity: Not on file  Stress: Not on file  Social Connections: Not on file  Intimate Partner Violence: Not At Risk (10/08/2021)   Humiliation, Afraid, Rape, and Kick questionnaire    Fear of Current or Ex-Partner: No    Emotionally Abused: No    Physically Abused: No    Sexually Abused: No    Physical Exam: Today's Vitals   04/10/22 1420  Weight: 54.4 kg  Height: '5\' 9"'$  (1.753 m)   Body mass index is 17.72 kg/m. GEN: NAD EYE: Sclerae anicteric ENT: MMM CV: Non-tachycardic GI: Soft, NT/ND NEURO:  Alert & Oriented x 3  Lab Results: No results for input(s): "WBC", "HGB", "HCT", "PLT" in the last 72 hours. BMET No results for input(s): "NA", "K", "CL", "CO2", "GLUCOSE", "BUN", "CREATININE", "CALCIUM" in the last 72 hours. LFT No results for input(s): "PROT", "ALBUMIN", "AST", "ALT", "ALKPHOS", "BILITOT", "BILIDIR", "IBILI" in the last 72 hours. PT/INR No results for input(s): "LABPROT", "INR" in the last 72 hours.   Impression / Plan: This is a 81 y.o.female who presents for flexible sigmoidoscopy for evaluation of previous rectal cancer status post chemo XRT with imaging concerning for recurrence or persistent disease.  The risks and benefits of endoscopic evaluation/treatment were discussed with the patient and/or family; these include but are not limited to the risk of perforation, infection, bleeding, missed lesions, lack of diagnosis, severe illness requiring hospitalization, as well as anesthesia and sedation related illnesses.  The patient's history has been reviewed, patient examined, no change in status, and deemed stable for procedure.  The patient and/or family is agreeable to proceed.    Justice Britain, MD [facility] Office # [phone]

## 2022-04-17 NOTE — Anesthesia Procedure Notes (Signed)
Procedure Name: MAC Date/Time: 04/17/2022 1:55 PM  Performed by: Cynda Familia, CRNAPre-anesthesia Checklist: Patient identified, Emergency Drugs available, Suction available, Patient being monitored and Timeout performed Oxygen Delivery Method: Simple face mask Placement Confirmation: positive ETCO2 and breath sounds checked- equal and bilateral Dental Injury: Teeth and Oropharynx as per pre-operative assessment  Comments: Very poor dentition-- many missing and chipped teeth

## 2022-04-17 NOTE — Op Note (Addendum)
[facility] Patient Name: Tina Waller Procedure Date: 04/17/2022 MRN: [id number] Attending MD: Justice Britain , MD Date of Birth: [birth year] CSN: [id number] Age: 81 Admit Type: Outpatient Procedure:                Flexible Sigmoidoscopy Indications:              Personal history of malignant rectal neoplasm,                            Abnormal CT of the GI tract Providers:                Justice Britain, MD, Dulcy Fanny, Cletis Athens, Technician Referring MD:             Truitt Merle, Leighton Ruff, MD, [name] Medicines:                Monitored Anesthesia Care Complications:            No immediate complications. Estimated Blood Loss:     Estimated blood loss was minimal. Procedure:                Pre-Anesthesia Assessment:                           - Prior to the procedure, a History and Physical                            was performed, and patient medications and                            allergies were reviewed. The patient's tolerance of                            previous anesthesia was also reviewed. The risks                            and benefits of the procedure and the sedation                            options and risks were discussed with the patient.                            All questions were answered, and informed consent                            was obtained. Prior Anticoagulants: The patient has                            taken Plavix (clopidogrel), last dose was 5 days                            prior to procedure. ASA Grade Assessment: III - A  patient with severe systemic disease. After                            reviewing the risks and benefits, the patient was                            deemed in satisfactory condition to undergo the                            procedure.                           After obtaining informed consent, the scope was                             passed under direct vision. The [id number]-H190 ([id number])                            Olympus endoscope was introduced through the anus                            and advanced to the the descending colon. The                            flexible sigmoidoscopy was accomplished without                            difficulty. The patient tolerated the procedure.                            The quality of the bowel preparation was adequate. Scope In: Scope Out: Findings:      The digital rectal exam findings include palpable rectal mass (in the       very tip of the finger during DRE exam) and hemorrhoids.      Multiple small-mouthed diverticula were found in the recto-sigmoid       colon, sigmoid colon and descending colon.      A fungating, infiltrative and ulcerated non-obstructing large mass was       found in the proximal rectum (from 9 to 12 cm). The mass was partially       circumferential (involving two-thirds of the lumen circumference). The       mass measured at least three cm in length. No bleeding was present.       Biopsies were taken with a cold forceps for histology to rule in       persistent disease.      Patchy areas of mildly erythematous mucosa was found in the rectum and       in the recto-sigmoid colon?query radiation effect.      Non-bleeding non-thrombosed external and internal hemorrhoids were found       during retroflexion, during perianal exam and during digital exam. The       hemorrhoids were Grade I (internal hemorrhoids that do not prolapse). Impression:               - Palpable rectal mass and hemorrhoids found on  digital rectal exam.                           - Diverticulosis in the recto-sigmoid colon, in the                            sigmoid colon and in the descending colon.                           - Rule out remaining malignancy, tumor in the                            proximal rectum (9 to 12 cm). Biopsied.                            - Erythematous mucosa in the rectum and in the                            recto-sigmoid colon?likely radiation effect.                           - Non-bleeding non-thrombosed external and internal                            hemorrhoids. Moderate Sedation:      Not Applicable - Patient had care per Anesthesia. Recommendation:           - The patient will be observed post-procedure,                            until all discharge criteria are met.                           - Discharge patient to home.                           - Patient has a contact number available for                            emergencies. The signs and symptoms of potential                            delayed complications were discussed with the                            patient. Return to normal activities tomorrow.                            Written discharge instructions were provided to the                            patient.                           - Resume previous diet.                           -  Await pathology results.                           - May restart Plavix tomorrow.                           - Continue present medications otherwise.                           - Follow-up to be dictated based on results in                            regards to what additional treatments may be                            considered.                           - The findings and recommendations were discussed                            with the patient.                           - The findings and recommendations were discussed                            with the designated responsible adult. Procedure Code(s):        --- Professional ---                           [phone], Sigmoidoscopy, flexible; with biopsy, single                            or multiple Diagnosis Code(s):        --- Professional ---                           K62.89, Other specified diseases of anus and rectum                           K64.0,  First degree hemorrhoids                           D49.0, Neoplasm of unspecified behavior of                            digestive system                           K63.89, Other specified diseases of intestine                           Z85.048, Personal history of other malignant                            neoplasm of rectum, rectosigmoid junction, and anus  K57.30, Diverticulosis of large intestine without                            perforation or abscess without bleeding                           R93.3, Abnormal findings on diagnostic imaging of                            other parts of digestive tract CPT copyright 2019 American Medical Association. All rights reserved. The codes documented in this report are preliminary and upon coder review may  be revised to meet current compliance requirements. Justice Britain, MD 04/17/2022 2:33:49 PM Number of Addenda: 0

## 2022-04-17 NOTE — Transfer of Care (Signed)
Immediate Anesthesia Transfer of Care Note  Patient: Tina Waller  Procedure(s) Performed: FLEXIBLE SIGMOIDOSCOPY BIOPSY  Patient Location: PACU and Endoscopy Unit  Anesthesia Type:MAC  Level of Consciousness: awake and alert   Airway & Oxygen Therapy: Patient Spontanous Breathing and Patient connected to face mask oxygen  Post-op Assessment: Report given to RN and Post -op Vital signs reviewed and stable  Post vital signs: Reviewed and stable  Last Vitals:  Vitals Value Taken Time  BP 217/72 04/17/22 1442  Temp 36.7 C 04/17/22 1430  Pulse 64 04/17/22 1443  Resp 16 04/17/22 1443  SpO2 99 % 04/17/22 1443  Vitals shown include unvalidated device data.  Last Pain:  Vitals:   04/17/22 1442  TempSrc:   PainSc: 0-No pain         Complications: No notable events documented.

## 2022-04-17 NOTE — Discharge Instructions (Signed)
YOU HAD AN ENDOSCOPIC PROCEDURE TODAY: Refer to the procedure report and other information in the discharge instructions given to you for any specific questions about what was found during the examination. If this information does not answer your questions, please call [place] office at [phone] to clarify.  ° °YOU SHOULD EXPECT: Some feelings of bloating in the abdomen. Passage of more gas than usual. Walking can help get rid of the air that was put into your GI tract during the procedure and reduce the bloating. If you had a lower endoscopy (such as a colonoscopy or flexible sigmoidoscopy) you may notice spotting of blood in your stool or on the toilet paper. Some abdominal soreness may be present for a day or two, also. ° °DIET: Your first meal following the procedure should be a light meal and then it is ok to progress to your normal diet. A half-sandwich or bowl of soup is an example of a good first meal. Heavy or fried foods are harder to digest and may make you feel nauseous or bloated. Drink plenty of fluids but you should avoid alcoholic beverages for 24 hours. If you had a esophageal dilation, please see attached instructions for diet.   ° °ACTIVITY: Your care partner should take you home directly after the procedure. You should plan to take it easy, moving slowly for the rest of the day. You can resume normal activity the day after the procedure however YOU SHOULD NOT DRIVE, use power tools, machinery or perform tasks that involve climbing or major physical exertion for 24 hours (because of the sedation medicines used during the test).  ° °SYMPTOMS TO REPORT IMMEDIATELY: °A gastroenterologist can be reached at any hour. Please call [phone]  for any of the following symptoms:  °Following lower endoscopy (colonoscopy, flexible sigmoidoscopy) °Excessive amounts of blood in the stool  °Significant tenderness, worsening of abdominal pains  °Swelling of the abdomen that is new, acute  °Fever of 100° or  higher  °Following upper endoscopy (EGD, EUS, ERCP, esophageal dilation) °Vomiting of blood or coffee ground material  °New, significant abdominal pain  °New, significant chest pain or pain under the shoulder blades  °Painful or persistently difficult swallowing  °New shortness of breath  °Black, tarry-looking or red, bloody stools ° °FOLLOW UP:  °If any biopsies were taken you will be contacted by phone or by letter within the next 1-3 weeks. Call [phone]  if you have not heard about the biopsies in 3 weeks.  °Please also call with any specific questions about appointments or follow up tests. ° °

## 2022-04-17 NOTE — Anesthesia Preprocedure Evaluation (Addendum)
Anesthesia Evaluation  Patient identified by MRN, date of birth, ID band Patient awake    Reviewed: Allergy & Precautions, NPO status , Patient's Chart, lab work & pertinent test results  Airway Mallampati: II       Dental   Pulmonary former smoker,    breath sounds clear to auscultation       Cardiovascular hypertension,  Rhythm:Regular     Neuro/Psych  Headaches,    GI/Hepatic Neg liver ROS, GERD  ,Hx noted Dr. Nyoka Cowden   Endo/Other  diabetes  Renal/GU Renal disease     Musculoskeletal   Abdominal   Peds  Hematology   Anesthesia Other Findings   Reproductive/Obstetrics                             Anesthesia Physical Anesthesia Plan  ASA: 3  Anesthesia Plan: MAC   Post-op Pain Management:    Induction: Intravenous  PONV Risk Score and Plan: Treatment may vary due to age or medical condition and Propofol infusion  Airway Management Planned: Nasal Cannula and Simple Face Mask  Additional Equipment:   Intra-op Plan:   Post-operative Plan:   Informed Consent: I have reviewed the patients History and Physical, chart, labs and discussed the procedure including the risks, benefits and alternatives for the proposed anesthesia with the patient or authorized representative who has indicated his/her understanding and acceptance.     Dental advisory given  Plan Discussed with: CRNA and Anesthesiologist  Anesthesia Plan Comments:         Anesthesia Quick Evaluation

## 2022-04-18 ENCOUNTER — Encounter: Payer: Self-pay | Admitting: Gastroenterology

## 2022-04-18 LAB — SURGICAL PATHOLOGY

## 2022-04-21 ENCOUNTER — Encounter (HOSPITAL_COMMUNITY): Payer: Self-pay | Admitting: Gastroenterology

## 2022-04-23 ENCOUNTER — Inpatient Hospital Stay: Payer: Medicare HMO | Admitting: Hematology

## 2022-04-23 ENCOUNTER — Inpatient Hospital Stay: Payer: Medicare HMO

## 2022-04-28 ENCOUNTER — Telehealth: Payer: Self-pay

## 2022-04-28 NOTE — Telephone Encounter (Signed)
The following message was received from [employer]:  We are contacting you to let you know we have been unable to reach the client listed below in 60 days. Our current business policy indicates we prefer to have contact with each client AT LEAST once every 30 days to ensure they are receiving their meals and are happy with their selections. At this time we will be placing the account on hold due to no contact.

## 2022-04-30 ENCOUNTER — Ambulatory Visit: Payer: Medicare HMO | Admitting: Podiatry

## 2022-04-30 ENCOUNTER — Encounter: Payer: Self-pay | Admitting: Podiatry

## 2022-04-30 DIAGNOSIS — R6889 Other general symptoms and signs: Secondary | ICD-10-CM | POA: Diagnosis not present

## 2022-04-30 DIAGNOSIS — M79675 Pain in left toe(s): Secondary | ICD-10-CM

## 2022-04-30 DIAGNOSIS — B351 Tinea unguium: Secondary | ICD-10-CM

## 2022-04-30 DIAGNOSIS — L853 Xerosis cutis: Secondary | ICD-10-CM | POA: Diagnosis not present

## 2022-04-30 DIAGNOSIS — M79674 Pain in right toe(s): Secondary | ICD-10-CM

## 2022-04-30 MED ORDER — AMMONIUM LACTATE 12 % EX LOTN: 1.0000 | TOPICAL_LOTION | CUTANEOUS | 5 refills | Status: AC | PRN

## 2022-04-30 NOTE — Progress Notes (Signed)
  Subjective:  Patient ID: Tina Waller, female    DOB: 1941/04/02,  MRN: [id number]  Tina Waller presents to clinic today for:  Chief Complaint  Patient presents with   Nail Problem    Routine foot care PCP-Deborah Wynetta Emery PCP [facility]- Patient states that she does not remember     New problem(s): None.   PCP is [name], MD , and last visit was  January 07, 2022.  Allergies  Allergen Reactions   Lisinopril Cough   Carvedilol Diarrhea   Hydralazine     Vomiting and diarrhea   Lipitor [Atorvastatin] Diarrhea    Review of Systems: Negative except as noted in the HPI.  Objective: No changes noted in today's physical examination.  Tina Waller is a pleasant 81 y.o. female WD, WN in NAD. AAO x 3. Vascular Examination: Capillary refill time immediate b/l. Vascular status intact b/l with palpable pedal pulses. Pedal hair present b/l. No edema. No pain with calf compression b/l. Skin temperature gradient WNL b/l.   Neurological Examination: Sensation grossly intact b/l with 10 gram monofilament. Vibratory sensation intact b/l.   Dermatological Examination: Pedal skin with normal turgor, texture and tone b/l. Toenails 1-5 b/l thick, discolored, elongated with subungual debris and pain on dorsal palpation. Pedal skin noted to be dry b/l lower extremities.  Musculoskeletal Examination: Normal muscle strength 5/5 to all lower extremity muscle groups bilaterally. HAV with bunion bilaterally and hammertoes 2-5 b/l.Marland Kitchen No pain, crepitus or joint limitation noted with ROM b/l LE.  Patient ambulates independently without assistive aids.  Radiographs: None  Assessment/Plan: 1. Pain due to onychomycosis of toenails of both feet   2. Xerosis cutis     Meds ordered this encounter  Medications   ammonium lactate (LAC-HYDRIN) 12 % lotion    Sig: Apply 1 Application topically as needed for dry skin.    Dispense:  400 g    Refill:  5    -Patient was evaluated and  treated. All patient's and/or POA's questions/concerns answered on today's visit. -Patient to continue soft, supportive shoe gear daily. -Mycotic toenails 1-5 bilaterally were debrided in length and girth with sterile nail nippers and dremel without incident. -For xerosis, Rx sent for Ammonium Lactate Lotion 12%. Apply to feet twice daily avoiding application between toes. -Recommended Skechers shoes with stretchable uppers and memory foam insoles. They can be purchased at [employer] or [email or web address].  -Patient/POA to call should there be question/concern in the interim.   Return in about 3 months (around 07/31/2022).  Marzetta Board, DPM

## 2022-04-30 NOTE — Patient Instructions (Signed)
Recommend Skechers Loafers with stretchable uppers and memory foam insoles. They can be purchased at [employer]. If feet are swollen, try men's wide shoe.

## 2022-05-14 ENCOUNTER — Telehealth: Payer: Self-pay | Admitting: Pharmacy Technician

## 2022-05-14 ENCOUNTER — Other Ambulatory Visit: Payer: Self-pay

## 2022-05-14 ENCOUNTER — Inpatient Hospital Stay (HOSPITAL_BASED_OUTPATIENT_CLINIC_OR_DEPARTMENT_OTHER): Payer: Medicare HMO | Admitting: Hematology

## 2022-05-14 ENCOUNTER — Inpatient Hospital Stay: Payer: Medicare HMO | Attending: Hematology

## 2022-05-14 ENCOUNTER — Other Ambulatory Visit (HOSPITAL_COMMUNITY): Payer: Self-pay

## 2022-05-14 ENCOUNTER — Telehealth: Payer: Self-pay | Admitting: Pharmacist

## 2022-05-14 ENCOUNTER — Encounter: Payer: Self-pay | Admitting: Hematology

## 2022-05-14 VITALS — BP 195/80 | HR 79 | Temp 97.5°F | Resp 18 | Wt 120.3 lb

## 2022-05-14 DIAGNOSIS — D649 Anemia, unspecified: Secondary | ICD-10-CM | POA: Diagnosis not present

## 2022-05-14 DIAGNOSIS — N189 Chronic kidney disease, unspecified: Secondary | ICD-10-CM | POA: Insufficient documentation

## 2022-05-14 DIAGNOSIS — Z79899 Other long term (current) drug therapy: Secondary | ICD-10-CM | POA: Diagnosis not present

## 2022-05-14 DIAGNOSIS — K922 Gastrointestinal hemorrhage, unspecified: Secondary | ICD-10-CM

## 2022-05-14 DIAGNOSIS — R6889 Other general symptoms and signs: Secondary | ICD-10-CM | POA: Diagnosis not present

## 2022-05-14 DIAGNOSIS — Z5112 Encounter for antineoplastic immunotherapy: Secondary | ICD-10-CM | POA: Insufficient documentation

## 2022-05-14 DIAGNOSIS — C20 Malignant neoplasm of rectum: Secondary | ICD-10-CM

## 2022-05-14 LAB — CBC WITH DIFFERENTIAL (CANCER CENTER ONLY)
Abs Immature Granulocytes: 0.01 K/uL (ref 0.00–0.07)
Basophils Absolute: 0 K/uL (ref 0.0–0.1)
Basophils Relative: 1 %
Eosinophils Absolute: 0.1 K/uL (ref 0.0–0.5)
Eosinophils Relative: 2 %
HCT: 35.1 % — ABNORMAL LOW (ref 36.0–46.0)
Hemoglobin: 11.7 g/dL — ABNORMAL LOW (ref 12.0–15.0)
Immature Granulocytes: 0 %
Lymphocytes Relative: 33 %
Lymphs Abs: 1.3 K/uL (ref 0.7–4.0)
MCH: 31 pg (ref 26.0–34.0)
MCHC: 33.3 g/dL (ref 30.0–36.0)
MCV: 93.1 fL (ref 80.0–100.0)
Monocytes Absolute: 0.5 K/uL (ref 0.1–1.0)
Monocytes Relative: 13 %
Neutro Abs: 2 K/uL (ref 1.7–7.7)
Neutrophils Relative %: 51 %
Platelet Count: 201 K/uL (ref 150–400)
RBC: 3.77 MIL/uL — ABNORMAL LOW (ref 3.87–5.11)
RDW: 12.6 % (ref 11.5–15.5)
WBC Count: 3.9 K/uL — ABNORMAL LOW (ref 4.0–10.5)
nRBC: 0 % (ref 0.0–0.2)

## 2022-05-14 LAB — CMP (CANCER CENTER ONLY)
ALT: 11 U/L (ref 0–44)
AST: 16 U/L (ref 15–41)
Albumin: 4.2 g/dL (ref 3.5–5.0)
Alkaline Phosphatase: 121 U/L (ref 38–126)
Anion gap: 7 (ref 5–15)
BUN: 27 mg/dL — ABNORMAL HIGH (ref 8–23)
CO2: 30 mmol/L (ref 22–32)
Calcium: 9.5 mg/dL (ref 8.9–10.3)
Chloride: 105 mmol/L (ref 98–111)
Creatinine: 1.37 mg/dL — ABNORMAL HIGH (ref 0.44–1.00)
GFR, Estimated: 39 mL/min — ABNORMAL LOW (ref >60)
Glucose, Bld: 109 mg/dL — ABNORMAL HIGH (ref 70–99)
Potassium: 3.7 mmol/L (ref 3.5–5.1)
Sodium: 142 mmol/L (ref 135–145)
Total Bilirubin: 0.5 mg/dL (ref 0.3–1.2)
Total Protein: 7.6 g/dL (ref 6.5–8.1)

## 2022-05-14 LAB — IRON AND IRON BINDING CAPACITY (CC-WL,HP ONLY)
Iron: 89 ug/dL (ref 28–170)
Saturation Ratios: 30 % (ref 10.4–31.8)
TIBC: 301 ug/dL (ref 250–450)
UIBC: 212 ug/dL (ref 148–442)

## 2022-05-14 LAB — CEA (IN HOUSE-CHCC): CEA (CHCC-In House): 1.95 ng/mL (ref 0.00–5.00)

## 2022-05-14 LAB — FERRITIN: Ferritin: 26 ng/mL (ref 11–307)

## 2022-05-14 MED ORDER — CAPECITABINE 500 MG PO TABS
ORAL_TABLET | ORAL | 1 refills | Status: DC
  Filled 2022-05-14: qty 56, fill #0

## 2022-05-14 NOTE — Telephone Encounter (Signed)
Oral Oncology Pharmacist Encounter  Received new prescription for Xeloda (capecitabine) for the treatment of rectal cancer, planned duration until disease progression or unacceptable drug toxicity.  CBC w/ Diff and CMP from 05/14/22 assessed, noted pt with SCr of 1.37 mg/dL (CrCl ~28.7 mL/min) - Dr. Burr Medico aware of low CrCl and is starting patient on dose reduction. Prescription dose and frequency assessed for appropriateness.  Current medication list in Epic reviewed, DDIs with Xeloda identified: Category C DDI between Xeloda and Ondansetron due to risk of Qtc prolongation with fluorouracil products. Noted patient only taking PRN and PO route, risk higher with IV administration. No change in therapy warranted at this time.   Evaluated chart and no patient barriers to medication adherence noted.   Patient agreement for treatment documented in MD note on 05/14/22.  Prescription has been e-scribed to the [facility] for benefits analysis and approval.  Oral Oncology Clinic will continue to follow for insurance authorization, copayment issues, initial counseling and start date.  Leron Croak, PharmD, BCPS, [facility] Hematology/Oncology Clinical Pharmacist [facility] and [place] [phone] 05/14/2022 2:33 PM

## 2022-05-14 NOTE — Telephone Encounter (Signed)
Oral Oncology Patient Advocate Encounter  After completing a benefits investigation, prior authorization for Capecitabine is not required at this time through [facility].  Patient's copay is $0.     Lady Deutscher, CPhT-Adv Oncology Pharmacy Patient [place] Direct Number: [phone]  Fax: [phone]

## 2022-05-14 NOTE — Progress Notes (Signed)
[place]   [phone] [phone]   Clinic Follow up Note   Patient Care Team: [name], MD as PCP - General (Internal Medicine) Truitt Merle, MD as Consulting Physician (Hematology)  Date of Service:  05/14/2022  CHIEF COMPLAINT: f/u of rectal cancer  CURRENT THERAPY:  To restart Xeloda  ASSESSMENT & PLAN:  Tina Waller is a 81 y.o. female with   1. Rectal Cancer, stage I c(T2, N0)M0 -presented to ED on 08/10/21 with weakness and black stool. Colonoscopy and EGD on 08/15/21 by Dr. Rush Landmark showed a palpable, partially obstructing rectal mass. Path confirmed fragments of adenocarcinoma with multifocal high-grade dysplasia and focally suspicious for intramucosal carcinoma. -CT CAP on 08/16/21 showed: two tiny nonspecific liver lesions; 9 mm hypodense pancreatic body lesion. MRCP on 08/18/21 showed hepatic lesion compatible with simple hepatic cyst, within limitation of beam hardening artifact, and small lesions in right psoas muscle. -baseline CEA on 08/16/21 was normal at 3.7. -staging pelvis MRI on 08/21/21 showed stage T2 N0 rectal cancer. -she met Dr. Marcello Moores on 09/23/21. Pt declined surgery due to concern for side effects/quality of life as she lives alone. -s/p concurrent chemoradiation with Xeloda 10/21/21 - 11/28/21. Tolerated well overall. -restaging pelvis MRI 02/24/22 showed: reduction in volume of rectal carcinoma, remains stage T2 N0. -she was taken for sigmoidoscopy on 04/17/22 with Dr. Rush Landmark. DRE showed a palpable rectal mass at very tip of the finger. Exam showed 3 cm non-obstructing mass at 9-12 cm in proximal rectum. Biopsy of the mass confirmed at least intramucosal carcinoma in background of extensive high-grade dysplasia. -we reviewed the pathology results today. We again discussed surgery, and she again declines, despite expressing understanding that this is the only way to cure her disease. She is open to other treatment  options. I recommend restarting Xeloda, with reduced dose due to low GFR. I also recommend obtaining a new baseline CT scan; I ordered today to be done in the next few weeks. -will request FO on her biopsy to see if she is a candidate for IO or targeted therapy  -Lab reviewed, creatinine up to 1.37, GFR 39, hgb improved to 11.7.   2. Anemia -secondary to GI blood loss and CKD -Overall improving.   3. Social Support -she previously lived independently and did not require assistance. -she is currently in an apartment and has support from some of her neighbors. -her son, who lives in [place], is currently in the ICU following an accident at his job.     PLAN: -restaging CT CAP wo contrast to be done soon -I called in Xeloda 1032m bid day 1-14, every 21 days, plan to start next week when she received it. Dose reduced due to her CKD  -will let her stop lasix to see if her renal function improves  -lab and f/u in 3 weeks   No problem-specific Assessment & Plan notes found for this encounter.   SUMMARY OF ONCOLOGIC HISTORY: Oncology History  [place]  08/15/2021 Cancer Staging   Staging form: Colon and Rectum, AJCC 8th Edition - Clinical stage from 08/15/2021: Stage I (cT2, cN0, cM0) - Signed by FTruitt Merle MD on 09/05/2021 Stage prefix: Initial diagnosis Total positive nodes: 0   08/15/2021 Procedure   Colonoscopy, Dr. MRush Landmark Impression: - Palpable rectal mass found on digital rectal exam. - The examined portion of the ileum was normal. - Four 3 to 7 mm polyps at the recto-sigmoid colon, in the descending colon and  in the ascending colon, removed with a cold snare. Resected and retrieved. - Rule out malignancy, partially obstructing tumor in the proximal/middle rectum. Biopsied. Tattooed proximal to lesion. - Diverticulosis in the entire examined colon. - Normal mucosa in the entire examined colon otherwise. - Non-bleeding non-thrombosed external and internal  hemorrhoids.   08/15/2021 Initial Biopsy   FINAL MICROSCOPIC DIAGNOSIS:   A. STOMACH, BIOPSY:  - Gastric antral and oxyntic mucosa with nonspecific reactive  gastropathy  - Helicobacter pylori-like organisms are not identified on routine HE stain   B. DUODENUM, BIOPSY:  - Duodenal mucosa with no specific histopathologic changes  - Negative for increased intraepithelial lymphocytes or villous  architectural changes   C. COLON, ASCENDING, DESCENDING AND SIGMOID, POLYPECTOMY:  - Tubular adenoma(s)  - Negative for high-grade dysplasia or malignancy   D. RECTAL MASS, BIOPSY:  - Fragments of an adenoma with multifocal high-grade dysplasia, focally suspicious for intramucosal carcinoma.  See comment    COMMENT:  D.  A deeper, more severe process cannot be ruled out.  Clinical and endoscopic correlation is suggested.    08/16/2021 Imaging   EXAM: CT CHEST, ABDOMEN, AND PELVIS WITH CONTRAST  IMPRESSION: 1. Large rectal mass. Two tiny hypodense lesions in the liver are nonspecific with regard to benign etiology or metastatic disease. 2. 9 mm in long axis hypodense pancreatic body lesion. Possibilities include postinflammatory cyst, intraductal papillary mucinous neoplasm, or less likely pancreatic adenocarcinoma. 3. Bilateral renal cysts. A complex right kidney upper pole lesion is indeterminate for complex cyst versus mass and measures 9 mm in diameter. 4. In light of these findings, consider upper abdominal MRI with contrast to further characterize the hepatic, pancreatic, and renal lesions. 5. There is also unusual left perirenal stranding especially adjacent to a cyst along the left kidney lower pole, this could be an indicator of inflammation involving the left kidney but is technically nonspecific. Some of this edema tracks along the left paracolic gutter. 6. Advanced systemic atherosclerosis. This includes aortic and coronary atherosclerosis along with mesenteric and  renal atherosclerosis. If further workup of the patient's severe atherosclerotic vascular disease is warranted, CT angiography would be recommended. 7. Small bilateral pleural effusions with passive atelectasis. 8. Severe centrilobular emphysema.  Emphysema (ICD10-J43.9). 9. Mild thoracolumbar scoliosis. 10. Scattered sigmoid colon diverticula.   08/18/2021 Imaging   EXAM: MRI ABDOMEN WITHOUT AND WITH CONTRAST (INCLUDING MRCP)  IMPRESSION: 1. Study is severely limited by beam hardening artifact from a large surgical clip in the stomach, which completely obscures the previously noted pancreatic lesion on most pulse sequences rendering today's study nondiagnostic. The only visualized hepatic lesion is compatible with a small simple hepatic cyst. 2. There is abnormal signal intensity and small rim enhancing lesions in the right psoas muscle. Clinical correlation for signs and symptoms of psoas abscess is recommended. Alternatively, the possibility of intramuscular metastasis could be considered. 3. Small bilateral pleural effusions lying dependently with areas of presumed passive atelectasis in the lower lobes of the lungs bilaterally. 4. Multiple simple cysts in the kidneys bilaterally. 5. Aortic atherosclerosis.   08/20/2021 Initial Diagnosis   Rectal cancer ([place])   08/21/2021 Imaging   EXAM: MRI PELVIS WITHOUT CONTRAST  IMPRESSION: Rectal adenocarcinoma T stage: T2   Rectal adenocarcinoma N stage:  N0   Distance from tumor to the internal anal sphincter is 4.5 cm.      INTERVAL HISTORY:  Tina Waller is here for a follow up of rectal cancer. She was last seen by me on  01/21/22. She presents to the clinic alone. She tells me she is not doing well due to concern for her son-- he is in the ICU with a collapsed lung after an accident at work.    All other systems were reviewed with the patient and are negative.  MEDICAL HISTORY:  Past Medical History:  Diagnosis Date    Anemia    Anxiety    Cancer ([place])    rectal   Cataract    Depression    Diabetes mellitus without complication ([place])    pt denies   GERD (gastroesophageal reflux disease)    Gout    Headache    Hyperlipidemia    Hypertension    Oxygen deficiency    wears oxygen at noc   Stroke [facility] 2008   09/14/2018   Tonsillar mass    Type 2 diabetes mellitus ([place]) 09/14/2018   Vertigo    Wears dentures    upper   Wears glasses     SURGICAL HISTORY: Past Surgical History:  Procedure Laterality Date   ABDOMINAL HYSTERECTOMY     BIOPSY  08/15/2021   Procedure: BIOPSY;  Surgeon: Irving Copas., MD;  Location: [facility];  Service: Gastroenterology;;   BIOPSY  04/17/2022   Procedure: BIOPSY;  Surgeon: Irving Copas., MD;  Location: [facility];  Service: Gastroenterology;;   BREAST SURGERY     lumpectomy   BUNIONECTOMY     CATARACT EXTRACTION Left    COLONOSCOPY     COLONOSCOPY W/ BIOPSIES AND POLYPECTOMY     COLONOSCOPY WITH PROPOFOL N/A 08/15/2021   Procedure: COLONOSCOPY WITH PROPOFOL;  Surgeon: Irving Copas., MD;  Location: [facility];  Service: Gastroenterology;  Laterality: N/A;   ESOPHAGOGASTRODUODENOSCOPY (EGD) WITH PROPOFOL N/A 08/15/2021   Procedure: ESOPHAGOGASTRODUODENOSCOPY (EGD) WITH PROPOFOL;  Surgeon: Rush Landmark Telford Nab., MD;  Location: [facility];  Service: Gastroenterology;  Laterality: N/A;   FLEXIBLE SIGMOIDOSCOPY N/A 04/17/2022   Procedure: FLEXIBLE SIGMOIDOSCOPY;  Surgeon: Rush Landmark Telford Nab., MD;  Location: [facility];  Service: Gastroenterology;  Laterality: N/A;   HEMOSTASIS CLIP PLACEMENT  08/15/2021   Procedure: HEMOSTASIS CLIP PLACEMENT;  Surgeon: Irving Copas., MD;  Location: [facility];  Service: Gastroenterology;;   HOT HEMOSTASIS N/A 08/15/2021   Procedure: HOT HEMOSTASIS (ARGON PLASMA COAGULATION/BICAP);  Surgeon: Irving Copas., MD;  Location: [facility];  Service: Gastroenterology;   Laterality: N/A;   MULTIPLE TOOTH EXTRACTIONS     POLYPECTOMY  08/15/2021   Procedure: POLYPECTOMY;  Surgeon: Mansouraty, Telford Nab., MD;  Location: [facility];  Service: Gastroenterology;;   SUBMUCOSAL TATTOO INJECTION  08/15/2021   Procedure: SUBMUCOSAL TATTOO INJECTION;  Surgeon: Irving Copas., MD;  Location: [facility];  Service: Gastroenterology;;   TONGUE BIOPSY Right 12/13/2018   Procedure: RIGHT TONSIL BIOPSY;  Surgeon: Melida Quitter, MD;  Location: [facility];  Service: ENT;  Laterality: Right;   UPPER GASTROINTESTINAL ENDOSCOPY      I have reviewed the social history and family history with the patient and they are unchanged from previous note.  ALLERGIES:  is allergic to lisinopril, carvedilol, hydralazine, and lipitor [atorvastatin].  MEDICATIONS:  Current Outpatient Medications  Medication Sig Dispense Refill   acetaminophen (TYLENOL) 500 MG tablet Take 500-1,000 mg by mouth every 6 (six) hours as needed for moderate pain or headache.     amLODipine (NORVASC) 10 MG tablet TAKE 1 TABLET EVERY DAY (Patient taking differently: Take 10 mg by mouth daily.) 90 tablet 0   ammonium lactate (LAC-HYDRIN) 12 % lotion Apply  1 Application topically as needed for dry skin. 400 g 5   capecitabine (XELODA) 500 MG tablet Take 2 tabs every 12 hours for 14 days then off for 7 days. Take within 30 minutes after meals. 56 tablet 1   cholecalciferol (VITAMIN D3) 25 MCG (1000 UNIT) tablet Take 2 tablets (2,000 Units total) by mouth daily. (Patient taking differently: Take 10,000 Units by mouth daily.) 100 tablet 1   clopidogrel (PLAVIX) 75 MG tablet Take 1 tablet (75 mg total) by mouth daily. 90 tablet 1   colchicine 0.6 MG tablet Take 1 tablet (0.6 mg total) by mouth daily. (Patient not taking: Reported on 01/07/2022) 14 tablet 0   ferrous gluconate (FERGON) 324 MG tablet Take 1 tablet (324 mg total) by mouth daily with breakfast. 30 tablet 0   furosemide (LASIX) 40 MG tablet TAKE 1 TABLET  TWICE DAILY 180 tablet 1   isosorbide mononitrate (IMDUR) 60 MG 24 hr tablet Take 1.5 tablets (90 mg total) by mouth daily. 135 tablet 3   labetalol (NORMODYNE) 100 MG tablet TAKE 1 TABLET TWICE DAILY 180 tablet 0   losartan (COZAAR) 100 MG tablet TAKE 1 TABLET EVERY DAY 90 tablet 0   metoprolol succinate (TOPROL-XL) 50 MG 24 hr tablet Take 100 mg by mouth daily. Take with or immediately following a meal. (Patient not taking: Reported on 04/30/2022)     mometasone (ELOCON) 0.1 % ointment Apply topically daily. (Patient taking differently: Apply 1 Application topically daily as needed (Eczema).) 45 g 0   Multiple Vitamin (MULTIVITAMIN WITH MINERALS) TABS tablet Take 1 tablet by mouth daily. 30 tablet 0   ondansetron (ZOFRAN) 4 MG tablet Take 1 tablet (4 mg total) by mouth every 8 (eight) hours as needed for nausea or vomiting. (Patient not taking: Reported on 01/07/2022) 20 tablet 0   polyethylene glycol (MIRALAX) packet Take 17 g by mouth daily as needed for mild constipation.     potassium chloride (KLOR-CON M) 10 MEQ tablet Take 1 tablet (10 mEq total) by mouth 2 (two) times daily. (Patient not taking: Reported on 01/07/2022) 14 tablet 0   pravastatin (PRAVACHOL) 80 MG tablet TAKE 1 TABLET EVERY DAY FOR HIGH CHOLESTEROL 90 tablet 1   vitamin B-12 (CYANOCOBALAMIN) 1000 MCG tablet Take 1,000 mcg by mouth daily.     No current facility-administered medications for this visit.    PHYSICAL EXAMINATION: ECOG PERFORMANCE STATUS: 1 - Symptomatic but completely ambulatory  Vitals:   05/14/22 1310  BP: (!) 195/80  Pulse: 79  Resp: 18  Temp: (!) 97.5 F (36.4 C)  SpO2: 98%   Wt Readings from Last 3 Encounters:  05/14/22 120 lb 5 oz (54.6 kg)  04/10/22 120 lb (54.4 kg)  03/12/22 118 lb 3.2 oz (53.6 kg)     GENERAL:alert, no distress and comfortable SKIN: skin color normal, no rashes or significant lesions EYES: normal, Conjunctiva are pink and non-injected, sclera clear  NEURO: alert &  oriented x 3 with fluent speech  LABORATORY DATA:  I have reviewed the data as listed    Latest Ref Rng & Units 05/14/2022   12:29 PM 01/21/2022    1:58 PM 11/25/2021   10:09 AM  CBC  WBC 4.0 - 10.5 K/uL 3.9  4.5  5.4   Hemoglobin 12.0 - 15.0 g/dL 11.7  10.0  9.6   Hematocrit 36.0 - 46.0 % 35.1  29.7  28.2   Platelets 150 - 400 K/uL 201  190  248  Latest Ref Rng & Units 05/14/2022   12:29 PM 01/21/2022    1:58 PM 11/25/2021   10:09 AM  CMP  Glucose 70 - 99 mg/dL 109  94  94   BUN 8 - 23 mg/dL _0 Creatinine 0.44 - 1.00 mg/dL 1.37  1.31  1.20   Sodium 135 - 145 mmol/L 142  140  140   Potassium 3.5 - 5.1 mmol/L 3.7  3.3  3.3   Chloride 98 - 111 mmol/L 105  103  109   CO2 22 - 32 mmol/L _1 Calcium 8.9 - 10.3 mg/dL 9.5  9.1  8.4   Total Protein 6.5 - 8.1 g/dL 7.6  7.1  6.2   Total Bilirubin 0.3 - 1.2 mg/dL 0.5  0.5  0.5   Alkaline Phos 38 - 126 U/L 121  78  74   AST 15 - 41 U/L _2 ALT 0 - 44 U/L _3 RADIOGRAPHIC STUDIES: I have personally reviewed the radiological images as listed and agreed with the findings in the report. No results found.    Orders Placed This Encounter  Procedures   CT CHEST ABDOMEN PELVIS WO CONTRAST    No iv contrast due to her CKD    Standing Status:   Future    Standing Expiration Date:   05/15/2023    Order Specific Question:   Preferred imaging location?    Answer:   [facility]   All questions were answered. The patient knows to call the clinic with any problems, questions or concerns. No barriers to learning was detected. The total time spent in the appointment was 30 minutes.     Truitt Merle, MD 05/14/2022   I, Wilburn Mylar, am acting as scribe for Truitt Merle, MD.   I have reviewed the above documentation for accuracy and completeness, and I agree with the above.

## 2022-05-15 ENCOUNTER — Telehealth: Payer: Self-pay | Admitting: Hematology

## 2022-05-15 NOTE — Telephone Encounter (Signed)
Left patient a voicemail regarding upcoming appointments  

## 2022-05-16 NOTE — Progress Notes (Signed)
[facility] order placed on Case#[id number].  Order confirmation# [id number] copy of order confirmation emailed to [facility] and cc Dr. Burr Medico.

## 2022-05-19 ENCOUNTER — Ambulatory Visit: Payer: Medicare HMO | Attending: Internal Medicine | Admitting: Internal Medicine

## 2022-05-19 ENCOUNTER — Encounter: Payer: Self-pay | Admitting: Internal Medicine

## 2022-05-19 VITALS — BP 179/75 | HR 67 | Temp 97.6°F | Ht 69.0 in | Wt 123.8 lb

## 2022-05-19 DIAGNOSIS — D500 Iron deficiency anemia secondary to blood loss (chronic): Secondary | ICD-10-CM | POA: Diagnosis not present

## 2022-05-19 DIAGNOSIS — R0902 Hypoxemia: Secondary | ICD-10-CM | POA: Diagnosis not present

## 2022-05-19 DIAGNOSIS — I10 Essential (primary) hypertension: Secondary | ICD-10-CM | POA: Diagnosis not present

## 2022-05-19 DIAGNOSIS — Z23 Encounter for immunization: Secondary | ICD-10-CM | POA: Diagnosis not present

## 2022-05-19 DIAGNOSIS — C20 Malignant neoplasm of rectum: Secondary | ICD-10-CM | POA: Diagnosis not present

## 2022-05-19 DIAGNOSIS — N1832 Chronic kidney disease, stage 3b: Secondary | ICD-10-CM

## 2022-05-19 MED ORDER — LABETALOL HCL 200 MG PO TABS: 200.0000 mg | ORAL_TABLET | Freq: Two times a day (BID) | ORAL | 2 refills | Status: AC

## 2022-05-19 MED ORDER — ZOSTER VAC RECOMB ADJUVANTED 50 MCG/0.5ML IM SUSR: 0.5000 mL | Freq: Once | INTRAMUSCULAR | 0 refills | Status: AC

## 2022-05-19 NOTE — Patient Instructions (Addendum)
Your blood pressure is quite elevated.  We have increase labetalol from 100 mg twice a day to 200 mg twice a day.  Please continue to monitor your blood pressure and also check your pulse.  If your pulse rate starts falling below 60, please give me a call to let me know as we may need to adjust the dose of the labetalol.  I have printed the prescription for the shingles vaccine.  You are due for the second shingles vaccine.  Please take the prescription to any outside pharmacy to be given the shot.  I recommend getting the vaccine before you start your next round of chemotherapy.  Please check your oxygen level at nights sometimes with your oxygen device on and other times with it off.  Let me know if your oxygen levels falls below 90 when you are not wearing the oxygen device at nights.

## 2022-05-19 NOTE — Progress Notes (Signed)
Patient ID: Tina Waller, female    DOB: August 04, 1940  MRN: [id number]  CC: Hypertension (HTN f/u. Trouble sleeping at night, waking up ever 2-3 hrs./Yes to flu vax.)   Subjective: Tina Waller is a 81 y.o. female who presents for chronic ds management Her concerns today include:  patient with history of HTN, CKD 3b with nephrotic range proteinuria (patient has declined biopsy ), HL, Rectal CA vit D deficiency, osteoporosis on Dexa scan done fall 2017, primary hyperparathyroidism,  depression, CVA in 2008 and 09/2018, vertebral artery stenosis and gout.  Rectal CA:  had repeat sigmoidoscopy done 04/17/2022.  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  Surgery recommended but patient has repeatedly declined surgery.  Plan is for targeted chemotherapy which has not started as yet.  She has iron deficiency anemia on top of anemia associated with CKD which has improved on iron supplement.  -pt still lives alone.  Son was checking on her regularly.  He lives in [place].  However he is currently hospitalized at [facility] in the ICU from a work-related injury where he broke his neck.  She has  visited him x2.  Patient no longer drives.  Her 62 year old granddaughter moved to [place] 2 months ago and drives her.  She also does meal prep for the patient.   Weight is up 6 pounds since she last saw me in June.  She is eating 3 meals a day and also drinking some Ensure shakes.  HTN:  BP elev today and on last visit with Dr. Burr Medico 5 days ago.  Pt reports BP has been running high at home as well.  She has 2 wks log with her.  Some of her recent readings are 172/73, 189/95, 160/79, 181/77.  She stopped drinking ETOH bev since 07/2021. She reports compliance with this blood pressure medications that include amlodipine 10 mg daily, furosemide 40 mg twice a day, isosorbide 90 mg daily, labetalol 100 mg twice a day, Cozaar 100 mg  daily Still sleeps with O2 at nights but does not feel she needs it.  Has Pox device. She checks the level sometimes at nights and it is around 92% but she does not recall if this is with or without the oxygen device..  During the day without O2 it is around 95%  CKD 3b:  GFR usually in 40-50s.  Most recently it was 23.  Last saw nephrology 1 yr ago.   HM:  dos want flu shot.  Due for second shingles vaccine.     Patient Active Problem List   Diagnosis Date Noted   Rectal cancer ([place])    Protein-calorie malnutrition, severe 08/19/2021   GI bleeding 08/12/2021   GI bleed 08/11/2021   COVID-19 virus infection 08/11/2021   Acute renal failure superimposed on stage 3 chronic kidney disease ([place]) 08/11/2021   Thrombocytopenia (HCC)    Cellulitis of left lower extremity 04/20/2020   Elevated alkaline phosphatase level 07/28/2019   Microscopic hematuria 09/24/2018   Rectal bleeding 09/24/2018   Insomnia 09/24/2018   Anxiety 09/14/2018   Primary hyperparathyroidism ([place]) 02/10/2018   Vitamin B 12 deficiency 12/11/2017   Pulmonary hypertension, mild ([place]) 12/11/2017   CKD (chronic kidney disease) stage 3, GFR 30-59 ml/min (HCC) 04/17/2017   Stasis dermatitis of both legs 04/17/2017   Osteoporosis 12/22/2016   Hx of gout 12/22/2016   Estrogen deficiency 05/12/2016   Vitamin D deficiency 05/12/2016  Weight loss 05/12/2016   HTN (hypertension) 06/12/2015   History of CVA (cerebrovascular accident) 03/28/2015     Current Outpatient Medications on File Prior to Visit  Medication Sig Dispense Refill   acetaminophen (TYLENOL) 500 MG tablet Take 500-1,000 mg by mouth every 6 (six) hours as needed for moderate pain or headache.     amLODipine (NORVASC) 10 MG tablet TAKE 1 TABLET EVERY DAY (Patient taking differently: Take 10 mg by mouth daily.) 90 tablet 0   ammonium lactate (LAC-HYDRIN) 12 % lotion Apply 1 Application topically as needed for dry skin. 400 g 5   cholecalciferol (VITAMIN D3)  25 MCG (1000 UNIT) tablet Take 2 tablets (2,000 Units total) by mouth daily. (Patient taking differently: Take 10,000 Units by mouth daily.) 100 tablet 1   clopidogrel (PLAVIX) 75 MG tablet Take 1 tablet (75 mg total) by mouth daily. 90 tablet 1   ferrous gluconate (FERGON) 324 MG tablet Take 1 tablet (324 mg total) by mouth daily with breakfast. 30 tablet 0   furosemide (LASIX) 40 MG tablet TAKE 1 TABLET TWICE DAILY 180 tablet 1   isosorbide mononitrate (IMDUR) 60 MG 24 hr tablet Take 1.5 tablets (90 mg total) by mouth daily. 135 tablet 3   losartan (COZAAR) 100 MG tablet TAKE 1 TABLET EVERY DAY 90 tablet 0   mometasone (ELOCON) 0.1 % ointment Apply topically daily. (Patient taking differently: Apply 1 Application topically daily as needed (Eczema).) 45 g 0   Multiple Vitamin (MULTIVITAMIN WITH MINERALS) TABS tablet Take 1 tablet by mouth daily. 30 tablet 0   ondansetron (ZOFRAN) 4 MG tablet Take 1 tablet (4 mg total) by mouth every 8 (eight) hours as needed for nausea or vomiting. 20 tablet 0   polyethylene glycol (MIRALAX) packet Take 17 g by mouth daily as needed for mild constipation.     potassium chloride (KLOR-CON M) 10 MEQ tablet Take 1 tablet (10 mEq total) by mouth 2 (two) times daily. 14 tablet 0   pravastatin (PRAVACHOL) 80 MG tablet TAKE 1 TABLET EVERY DAY FOR HIGH CHOLESTEROL 90 tablet 1   vitamin B-12 (CYANOCOBALAMIN) 1000 MCG tablet Take 1,000 mcg by mouth daily.     capecitabine (XELODA) 500 MG tablet Take 2 tabs every 12 hours for 14 days then off for 7 days. Take within 30 minutes after meals. (Patient not taking: Reported on 05/19/2022) 56 tablet 1   colchicine 0.6 MG tablet Take 1 tablet (0.6 mg total) by mouth daily. (Patient not taking: Reported on 01/07/2022) 14 tablet 0   No current facility-administered medications on file prior to visit.    Allergies  Allergen Reactions   Lisinopril Cough   Carvedilol Diarrhea   Hydralazine     Vomiting and diarrhea   Lipitor  [Atorvastatin] Diarrhea    Social History   Socioeconomic History   Marital status: Divorced    Spouse name: Not on file   Number of children: Not on file   Years of education: Not on file   Highest education level: Not on file  Occupational History   Not on file  Tobacco Use   Smoking status: Former    Types: Cigarettes    Quit date: 07/14/2006    Years since quitting: 15.8   Smokeless tobacco: Never  Vaping Use   Vaping Use: Never used  Substance and Sexual Activity   Alcohol use: Not Currently    Alcohol/week: 4.0 standard drinks of alcohol    Types: 4 Shots of liquor per week  Comment: none since Jan   Drug use: No   Sexual activity: Not Currently  Other Topics Concern   Not on file  Social History Narrative   Not on file   Social Determinants of Health   Financial Resource Strain: Not on file  Food Insecurity: Not on file  Transportation Needs: Not on file  Physical Activity: Not on file  Stress: Not on file  Social Connections: Not on file  Intimate Partner Violence: Not At Risk (10/08/2021)   Humiliation, Afraid, Rape, and Kick questionnaire    Fear of Current or Ex-Partner: No    Emotionally Abused: No    Physically Abused: No    Sexually Abused: No    Family History  Problem Relation Age of Onset   Heart disease Mother        CHF   Osteoporosis Neg Hx    Colon cancer Neg Hx    Stomach cancer Neg Hx    Pancreatic cancer Neg Hx    Esophageal cancer Neg Hx    Rectal cancer Neg Hx     Past Surgical History:  Procedure Laterality Date   ABDOMINAL HYSTERECTOMY     BIOPSY  08/15/2021   Procedure: BIOPSY;  Surgeon: Rush Landmark, Telford Nab., MD;  Location: [facility];  Service: Gastroenterology;;   BIOPSY  04/17/2022   Procedure: BIOPSY;  Surgeon: Irving Copas., MD;  Location: [facility];  Service: Gastroenterology;;   BREAST SURGERY     lumpectomy   BUNIONECTOMY     CATARACT EXTRACTION Left    COLONOSCOPY     COLONOSCOPY W/ BIOPSIES  AND POLYPECTOMY     COLONOSCOPY WITH PROPOFOL N/A 08/15/2021   Procedure: COLONOSCOPY WITH PROPOFOL;  Surgeon: Irving Copas., MD;  Location: [facility];  Service: Gastroenterology;  Laterality: N/A;   ESOPHAGOGASTRODUODENOSCOPY (EGD) WITH PROPOFOL N/A 08/15/2021   Procedure: ESOPHAGOGASTRODUODENOSCOPY (EGD) WITH PROPOFOL;  Surgeon: Rush Landmark Telford Nab., MD;  Location: [facility];  Service: Gastroenterology;  Laterality: N/A;   FLEXIBLE SIGMOIDOSCOPY N/A 04/17/2022   Procedure: FLEXIBLE SIGMOIDOSCOPY;  Surgeon: Rush Landmark Telford Nab., MD;  Location: [facility];  Service: Gastroenterology;  Laterality: N/A;   HEMOSTASIS CLIP PLACEMENT  08/15/2021   Procedure: HEMOSTASIS CLIP PLACEMENT;  Surgeon: Irving Copas., MD;  Location: [facility];  Service: Gastroenterology;;   HOT HEMOSTASIS N/A 08/15/2021   Procedure: HOT HEMOSTASIS (ARGON PLASMA COAGULATION/BICAP);  Surgeon: Irving Copas., MD;  Location: [facility];  Service: Gastroenterology;  Laterality: N/A;   MULTIPLE TOOTH EXTRACTIONS     POLYPECTOMY  08/15/2021   Procedure: POLYPECTOMY;  Surgeon: Mansouraty, Telford Nab., MD;  Location: [facility];  Service: Gastroenterology;;   SUBMUCOSAL TATTOO INJECTION  08/15/2021   Procedure: SUBMUCOSAL TATTOO INJECTION;  Surgeon: Irving Copas., MD;  Location: [facility];  Service: Gastroenterology;;   TONGUE BIOPSY Right 12/13/2018   Procedure: RIGHT TONSIL BIOPSY;  Surgeon: Melida Quitter, MD;  Location: [facility];  Service: ENT;  Laterality: Right;   UPPER GASTROINTESTINAL ENDOSCOPY      ROS: Review of Systems Negative except as stated above  PHYSICAL EXAM: BP (!) 179/75 (BP Location: Left Arm, Patient Position: Sitting, Cuff Size: Normal)   Pulse 67   Temp 97.6 F (36.4 C) (Oral)   Ht '5\' 9"'$  (1.753 m)   Wt 123 lb 12.8 oz (56.2 kg)   SpO2 97%   BMI 18.28 kg/m   Wt Readings from Last 3 Encounters:  05/19/22 123 lb 12.8 oz (56.2 kg)  05/14/22  120 lb 5 oz (54.6  kg)  04/10/22 120 lb (54.4 kg)    Physical Exam  General appearance - alert, well appearing, and in no distress Mental status -patient tearful when talking about her son who is in the ICU at [facility]. Neck - supple, no significant adenopathy Chest -breath sounds decreased moderately bilaterally.  No crackles or wheezes heard. Heart - normal rate, regular rhythm, normal S1, S2, no murmurs, rubs, clicks or gallops Extremities -no edema in the legs.  Trace ankle edema.      Latest Ref Rng & Units 05/14/2022   12:29 PM 01/21/2022    1:58 PM 11/25/2021   10:09 AM  CMP  Glucose 70 - 99 mg/dL 109  94  94   BUN 8 - 23 mg/dL '27  20  11   '$ Creatinine 0.44 - 1.00 mg/dL 1.37  1.31  1.20   Sodium 135 - 145 mmol/L 142  140  140   Potassium 3.5 - 5.1 mmol/L 3.7  3.3  3.3   Chloride 98 - 111 mmol/L 105  103  109   CO2 22 - 32 mmol/L '30  31  24   '$ Calcium 8.9 - 10.3 mg/dL 9.5  9.1  8.4   Total Protein 6.5 - 8.1 g/dL 7.6  7.1  6.2   Total Bilirubin 0.3 - 1.2 mg/dL 0.5  0.5  0.5   Alkaline Phos 38 - 126 U/L 121  78  74   AST 15 - 41 U/L '16  14  13   '$ ALT 0 - 44 U/L '11  8  7    '$ Lipid Panel     Component Value Date/Time   CHOL 166 07/26/2020 1503   TRIG 122 07/26/2020 1503   HDL 53 07/26/2020 1503   CHOLHDL 3.1 07/26/2020 1503   CHOLHDL 5.2 09/14/2018 0511   VLDL 15 09/14/2018 0511   LDLCALC 91 07/26/2020 1503    CBC    Component Value Date/Time   WBC 3.9 (L) 05/14/2022 1229   WBC 9.6 08/20/2021 0928   RBC 3.77 (L) 05/14/2022 1229   HGB 11.7 (L) 05/14/2022 1229   HGB 13.1 04/19/2021 1459   HCT 35.1 (L) 05/14/2022 1229   HCT 37.5 04/19/2021 1459   PLT 201 05/14/2022 1229   PLT 232 04/19/2021 1459   MCV 93.1 05/14/2022 1229   MCV 95 04/19/2021 1459   MCH 31.0 05/14/2022 1229   MCHC 33.3 05/14/2022 1229   RDW 12.6 05/14/2022 1229   RDW 11.6 (L) 04/19/2021 1459   LYMPHSABS 1.3 05/14/2022 1229   LYMPHSABS 2.0 07/26/2020 1503   MONOABS 0.5 05/14/2022 1229   EOSABS 0.1  05/14/2022 1229   EOSABS 0.2 07/26/2020 1503   BASOSABS 0.0 05/14/2022 1229   BASOSABS 0.0 07/26/2020 1503    ASSESSMENT AND PLAN: 1. Essential hypertension Not at goal.  Increase labetalol to 200 mg twice a day.  Continue other blood pressure medications as listed above. - labetalol (NORMODYNE) 200 MG tablet; Take 1 tablet (200 mg total) by mouth 2 (two) times daily.  Dispense: 180 tablet; Refill: 2  2. Stage 3b chronic kidney disease ([place]) This been somewhat of a decline in her GFR.  She declines referral back to the nephrologist stating that she has too much going on right now and is overwhelmed with current diagnosis of rectal cancer.  3. Iron deficiency anemia due to chronic blood loss Hemoglobin has improved on iron supplement  4. Hypoxia Advised to check her pulse ox several times at nights without the oxygen  on and write down the numbers.  If O2 level is staying above 90 then this is good.  However if she finds that her numbers are falling below 90, she should use the oxygen.  5. Rectal cancer ([place]) Followed by oncology.  Patient has declined surgery.  Plan is for second round of chemotherapy with possible targeted chemo  6. Need for influenza vaccination - Flu Vaccine QUAD High Dose(Fluad)  7. Need for shingles vaccine - Zoster Vaccine Adjuvanted [facility]) injection; Inject 0.5 mLs into the muscle once for 1 dose.  Dispense: 0.5 mL; Refill: 0     Patient was given the opportunity to ask questions.  Patient verbalized understanding of the plan and was able to repeat key elements of the plan.   This documentation was completed using Radio producer.  Any transcriptional errors are unintentional.  Orders Placed This Encounter  Procedures   Flu Vaccine QUAD High Dose(Fluad)     Requested Prescriptions   Signed Prescriptions Disp Refills   labetalol (NORMODYNE) 200 MG tablet 180 tablet 2    Sig: Take 1 tablet (200 mg total) by mouth 2 (two) times  daily.   Zoster Vaccine Adjuvanted [facility]) injection 0.5 mL 0    Sig: Inject 0.5 mLs into the muscle once for 1 dose.    Return in about 4 months (around 09/17/2022) for Give appt with CMA for Medicare Wellness. Appt with Lurena Joiner in 1 mth for BP check.  Karle Plumber, MD, FACP

## 2022-05-21 ENCOUNTER — Telehealth: Payer: Self-pay

## 2022-05-21 NOTE — Telephone Encounter (Signed)
LVM for pt to contact Central Scheduling at [phone] option#3 to schedule her CT Scan that Dr. Burr Medico ordered.  Informed pt that Dr. Burr Medico would like for her to have her CT Scan completed by 05/28/2022.  Informed pt that her insurance has approved for her to have the procedure.  Instructed pt to contact Dr. Ernestina Penna office once the CT Scan has been schedule so a f/u appt can be scheduled for the pt.

## 2022-05-21 NOTE — Telephone Encounter (Signed)
Spoke with pt via telephone to confirm f/u appt date and time with Dr. Burr Medico to go over CT Scan Results.  Pt confirmed appt & time.

## 2022-05-24 ENCOUNTER — Other Ambulatory Visit: Payer: Self-pay | Admitting: Internal Medicine

## 2022-05-26 ENCOUNTER — Ambulatory Visit (HOSPITAL_COMMUNITY)
Admission: RE | Admit: 2022-05-26 | Discharge: 2022-05-26 | Disposition: A | Discharge status: Home / Self Care | Payer: Medicare HMO | Source: Ambulatory Visit | Attending: Hematology | Admitting: Hematology

## 2022-05-26 ENCOUNTER — Other Ambulatory Visit: Payer: Self-pay

## 2022-05-26 ENCOUNTER — Telehealth: Payer: Self-pay

## 2022-05-26 ENCOUNTER — Other Ambulatory Visit (HOSPITAL_COMMUNITY): Payer: Self-pay

## 2022-05-26 DIAGNOSIS — R6889 Other general symptoms and signs: Secondary | ICD-10-CM | POA: Diagnosis not present

## 2022-05-26 DIAGNOSIS — C20 Malignant neoplasm of rectum: Secondary | ICD-10-CM | POA: Diagnosis not present

## 2022-05-26 DIAGNOSIS — J439 Emphysema, unspecified: Secondary | ICD-10-CM | POA: Diagnosis not present

## 2022-05-26 MED ORDER — CAPECITABINE 500 MG PO TABS
ORAL_TABLET | ORAL | 1 refills | Status: DC
  Filled 2022-05-26: qty 56, fill #0
  Filled 2022-05-27: qty 56, 21d supply, fill #0
  Filled 2022-06-09 – 2022-06-20 (×2): qty 56, 21d supply, fill #1

## 2022-05-26 NOTE — Telephone Encounter (Signed)
[employer] called requesting a P2P for pt's [employer] [facility] on rectal mass biopsy.  The contact number for FO [phone]) Claim#[id number].  Appointments for P2P with Medical Director are not being issued currently.  Provider will have to contact [facility] during Medical Directors availability times and be available when the Medical Director calls.    [phone]

## 2022-05-26 NOTE — Telephone Encounter (Signed)
Oral Chemotherapy Pharmacist Encounter   Attempted to reach patient to provide update and offer for initial counseling on oral medication: Xeloda (capecitabine).   No answer. Left voicemail for patient to call back to discuss details of medication acquisition and initial counseling session.  Leron Croak, PharmD, BCPS, [facility] Hematology/Oncology Clinical Pharmacist [facility] and [place] [phone] 05/26/2022 3:35 PM

## 2022-05-27 ENCOUNTER — Encounter (HOSPITAL_COMMUNITY): Payer: Self-pay | Admitting: Hematology

## 2022-05-27 ENCOUNTER — Other Ambulatory Visit (HOSPITAL_COMMUNITY): Payer: Self-pay

## 2022-05-27 NOTE — Telephone Encounter (Signed)
Oral Chemotherapy Pharmacist Encounter  I spoke with patient for overview of: Xeloda (capecitabine) for the  treatment of rectal cancer, planned duration until disease progression or unacceptable drug toxicity.  Counseled patient on administration, dosing, side effects, monitoring, drug-food interactions, safe handling, storage, and disposal.  Patient will take Xeloda '500mg'$  tablets, 2 tablets ('1000mg'$ ) by mouth in AM and 2 tabs ('1000mg'$ ) by mouth in PM, within 30 minutes of finishing meals, for 14 days on, 7 days off, repeated every 21 days.  Xeloda start date: 05/29/22 AM  Adverse effects include but are not limited to: fatigue, decreased blood counts, GI upset, diarrhea, mouth sores, and hand-foot syndrome. Hand-foot syndrome: discussed use of cream such as [place] Smooth Extra Care 20 or equivalent advanced care cream that has 20% urea content for advanced skin hydration while on Xeloda Diarrhea: Patient already has Imodium (loperamide) on hand at home to use if she experiences diarrhea. Patient knows to alert the office of 4 or more loose stools above baseline.  Reviewed with patient importance of keeping a medication schedule and plan for any missed doses. No barriers to medication adherence identified.  Medication reconciliation performed and medication/allergy list updated.  This will ship from the [facility] on 05/27/22 to deliver to patient's home on 05/28/22.  Patient informed the pharmacy will reach out 5-7 days prior to needing next fill of Xeloda to coordinate continued medication acquisition to prevent break in therapy.  All questions answered.  Ms. Smethers voiced understanding and appreciation.   Medication education handout placed in mail for patient. Patient knows to call the office with questions or concerns. Oral Chemotherapy Clinic phone number provided to patient.   Leron Croak, PharmD, BCPS, BCOP Hematology/Oncology Clinical  Pharmacist [facility] and [place] [phone] 05/27/2022 9:20 AM

## 2022-05-28 ENCOUNTER — Inpatient Hospital Stay (HOSPITAL_BASED_OUTPATIENT_CLINIC_OR_DEPARTMENT_OTHER): Payer: Medicare HMO | Admitting: Hematology

## 2022-05-28 ENCOUNTER — Encounter: Payer: Self-pay | Admitting: Hematology

## 2022-05-28 DIAGNOSIS — C20 Malignant neoplasm of rectum: Secondary | ICD-10-CM

## 2022-05-28 NOTE — Progress Notes (Signed)
[place]   [phone]) [phone] [phone]) [phone]   Clinic Follow up Note   Patient Care Team: [name], MD as PCP - General (Internal Medicine) Truitt Merle, MD as Consulting Physician (Hematology)  Date of Service:  05/28/2022  I connected with Tina Waller on 05/28/2022 at  3:40 PM EST by telephone visit and verified that I am speaking with the correct person using two identifiers.  I discussed the limitations, risks, security and privacy concerns of performing an evaluation and management service by telephone and the availability of in person appointments. I also discussed with the patient that there may be a patient responsible charge related to this service. The patient expressed understanding and agreed to proceed.   Other persons participating in the visit and their role in the encounter:  none  Patient's location:  home Provider's location:  my office  CHIEF COMPLAINT: f/u of rectal cancer  CURRENT THERAPY:  To start Xeloda, q21d  -dose: 1016m q12hr, days 1-14 To start Vectibix every 2 weeks   ASSESSMENT & PLAN:  Tina MARSHMANis a 81y.o. female with   1. Rectal Adenocarcinoma, stage I c(T2, N0)M0, MSS, KRAS/NRAS/BRAF wild type  -diagnosed 08/2021 by colonoscopy for black stool.  -staging CT CAP negative for metastatic disease. Baseline CEA on 08/16/21 was normal at 3.7. -she met Dr. TMarcello Mooreson 09/23/21. Pt declined surgery due to concern for side effects/quality of life as she lives alone. -s/p concurrent chemoradiation with Xeloda 10/21/21 - 11/28/21. Tolerated well overall. -restaging pelvis MRI 02/24/22 showed: reduction in volume of rectal carcinoma, remains stage T2 N0. -sigmoidoscopy on 04/17/22 with Dr. MRush Landmarkshowed 3 cm non-obstructing mass at 9-12 cm in proximal rectum. Biopsy of the mass confirmed at least intramucosal carcinoma in background of extensive high-grade dysplasia. -restaging CT CAP on 05/26/22 showed: rectal  wall thickening, not well evaluated; interval enlargement of groin lymph nodes bilaterally, I discussed that this could be related to her rectal cancer.  Given her advanced age and no plan for surgery, I do not plan to do a biopsy.  I also discussed other incidental findings. -FoundationOne obtained from her recent sigmoidoscopy biopsy showed MSI-stable.  KRAS, NRAS, and BRAF wild-type.  She is a candidate for Vectibix.  I discussed the potential benefit and side effects, especially skin rash, and diarrhea and management.  He is interested, agreed to start after Thanksgiving.    2. Anemia -secondary to GI blood loss and CKD -Overall improving.   3. Social Support -she previously lived independently and did not require assistance. -she is currently in an apartment and has support from some of her neighbors. -[place] following an accident at his job. -she requests transportation assistance. We will connect her with our service coordinator.     PLAN: -lab and f/u on 11/27, she will start Xeloda and Vectibix that day  -we will set her up with transportation services   No problem-specific Assessment & Plan notes found for this encounter.   SUMMARY OF ONCOLOGIC HISTORY: Oncology History  [place]al cancer ([place]  08/15/2021 Cancer Staging   Staging form: Colon and Rectum, AJCC 8th Edition - Clinical stage from 08/15/2021: Stage I (cT2, cN0, cM0) - Signed by FTruitt Merle MD on 09/05/2021 Stage prefix: Initial diagnosis Total positive nodes: 0   08/15/2021 Procedure   Colonoscopy, Dr. MRush Landmark Impression: - Palpable rectal mass found on digital rectal exam. - The examined portion of  the ileum was normal. - Four 3 to 7 mm polyps at the recto-sigmoid colon, in the descending colon and in the ascending colon, removed with a cold snare. Resected and retrieved. - Rule out malignancy, partially obstructing tumor in the proximal/middle rectum. Biopsied.  Tattooed proximal to lesion. - Diverticulosis in the entire examined colon. - Normal mucosa in the entire examined colon otherwise. - Non-bleeding non-thrombosed external and internal hemorrhoids.   08/15/2021 Initial Biopsy   FINAL MICROSCOPIC DIAGNOSIS:   A. STOMACH, BIOPSY:  - Gastric antral and oxyntic mucosa with nonspecific reactive  gastropathy  - Helicobacter pylori-like organisms are not identified on routine HE stain   B. DUODENUM, BIOPSY:  - Duodenal mucosa with no specific histopathologic changes  - Negative for increased intraepithelial lymphocytes or villous  architectural changes   C. COLON, ASCENDING, DESCENDING AND SIGMOID, POLYPECTOMY:  - Tubular adenoma(s)  - Negative for high-grade dysplasia or malignancy   D. RECTAL MASS, BIOPSY:  - Fragments of an adenoma with multifocal high-grade dysplasia, focally suspicious for intramucosal carcinoma.  See comment    COMMENT:  D.  A deeper, more severe process cannot be ruled out.  Clinical and endoscopic correlation is suggested.    08/16/2021 Imaging   EXAM: CT CHEST, ABDOMEN, AND PELVIS WITH CONTRAST  IMPRESSION: 1. Large rectal mass. Two tiny hypodense lesions in the liver are nonspecific with regard to benign etiology or metastatic disease. 2. 9 mm in long axis hypodense pancreatic body lesion. Possibilities include postinflammatory cyst, intraductal papillary mucinous neoplasm, or less likely pancreatic adenocarcinoma. 3. Bilateral renal cysts. A complex right kidney upper pole lesion is indeterminate for complex cyst versus mass and measures 9 mm in diameter. 4. In light of these findings, consider upper abdominal MRI with contrast to further characterize the hepatic, pancreatic, and renal lesions. 5. There is also unusual left perirenal stranding especially adjacent to a cyst along the left kidney lower pole, this could be an indicator of inflammation involving the left kidney but is technically nonspecific.  Some of this edema tracks along the left paracolic gutter. 6. Advanced systemic atherosclerosis. This includes aortic and coronary atherosclerosis along with mesenteric and renal atherosclerosis. If further workup of the patient's severe atherosclerotic vascular disease is warranted, CT angiography would be recommended. 7. Small bilateral pleural effusions with passive atelectasis. 8. Severe centrilobular emphysema.  Emphysema (ICD10-J43.9). 9. Mild thoracolumbar scoliosis. 10. Scattered sigmoid colon diverticula.   08/18/2021 Imaging   EXAM: MRI ABDOMEN WITHOUT AND WITH CONTRAST (INCLUDING MRCP)  IMPRESSION: 1. Study is severely limited by beam hardening artifact from a large surgical clip in the stomach, which completely obscures the previously noted pancreatic lesion on most pulse sequences rendering today's study nondiagnostic. The only visualized hepatic lesion is compatible with a small simple hepatic cyst. 2. There is abnormal signal intensity and small rim enhancing lesions in the right psoas muscle. Clinical correlation for signs and symptoms of psoas abscess is recommended. Alternatively, the possibility of intramuscular metastasis could be considered. 3. Small bilateral pleural effusions lying dependently with areas of presumed passive atelectasis in the lower lobes of the lungs bilaterally. 4. Multiple simple cysts in the kidneys bilaterally. 5. Aortic atherosclerosis.   08/20/2021 Initial Diagnosis   Rectal cancer ([place])   08/21/2021 Imaging   EXAM: MRI PELVIS WITHOUT CONTRAST  IMPRESSION: Rectal adenocarcinoma T stage: T2   Rectal adenocarcinoma N stage:  N0   Distance from tumor to the internal anal sphincter is 4.5 cm.   06/04/2022 -  Chemotherapy  Patient is on Treatment Plan : COLORECTAL Panitumumab q14d ([facility] - Type Gene Only)        INTERVAL HISTORY:  Tina Waller was contacted for a follow up of rectal cancer. She was last seen by me on  05/14/22.  She reports she is doing well overall. When reviewing her scan, she reports she has a palpable, moveable, quarter-sized lump in her groin area. She also notes a rash/pimple around the area as well.   All other systems were reviewed with the patient and are negative.  MEDICAL HISTORY:  Past Medical History:  Diagnosis Date   Anemia    Anxiety    Cancer ([place])    rectal   Cataract    Depression    Diabetes mellitus without complication ([place])    pt denies   GERD (gastroesophageal reflux disease)    Gout    Headache    Hyperlipidemia    Hypertension    Oxygen deficiency    wears oxygen at noc   Stroke [facility]) 2008   09/14/2018   Tonsillar mass    Type 2 diabetes mellitus ([place]) 09/14/2018   Vertigo    Wears dentures    upper   Wears glasses     SURGICAL HISTORY: Past Surgical History:  Procedure Laterality Date   ABDOMINAL HYSTERECTOMY     BIOPSY  08/15/2021   Procedure: BIOPSY;  Surgeon: Irving Copas., MD;  Location: [place];  Service: Gastroenterology;;   BIOPSY  04/17/2022   Procedure: BIOPSY;  Surgeon: Irving Copas., MD;  Location: [facility];  Service: Gastroenterology;;   BREAST SURGERY     lumpectomy   BUNIONECTOMY     CATARACT EXTRACTION Left    COLONOSCOPY     COLONOSCOPY W/ BIOPSIES AND POLYPECTOMY     COLONOSCOPY WITH PROPOFOL N/A 08/15/2021   Procedure: COLONOSCOPY WITH PROPOFOL;  Surgeon: Irving Copas., MD;  Location: [place];  Service: Gastroenterology;  Laterality: N/A;   ESOPHAGOGASTRODUODENOSCOPY (EGD) WITH PROPOFOL N/A 08/15/2021   Procedure: ESOPHAGOGASTRODUODENOSCOPY (EGD) WITH PROPOFOL;  Surgeon: Rush Landmark Telford Nab., MD;  Location: [place];  Service: Gastroenterology;  Laterality: N/A;   FLEXIBLE SIGMOIDOSCOPY N/A 04/17/2022   Procedure: FLEXIBLE SIGMOIDOSCOPY;  Surgeon: Rush Landmark Telford Nab., MD;  Location: [facility];  Service: Gastroenterology;  Laterality: N/A;   HEMOSTASIS CLIP  PLACEMENT  08/15/2021   Procedure: HEMOSTASIS CLIP PLACEMENT;  Surgeon: Irving Copas., MD;  Location: [place];  Service: Gastroenterology;;   HOT HEMOSTASIS N/A 08/15/2021   Procedure: HOT HEMOSTASIS (ARGON PLASMA COAGULATION/BICAP);  Surgeon: Irving Copas., MD;  Location: [place];  Service: Gastroenterology;  Laterality: N/A;   MULTIPLE TOOTH EXTRACTIONS     POLYPECTOMY  08/15/2021   Procedure: POLYPECTOMY;  Surgeon: Mansouraty, Telford Nab., MD;  Location: [place];  Service: Gastroenterology;;   SUBMUCOSAL TATTOO INJECTION  08/15/2021   Procedure: SUBMUCOSAL TATTOO INJECTION;  Surgeon: Irving Copas., MD;  Location: [place];  Service: Gastroenterology;;   TONGUE BIOPSY Right 12/13/2018   Procedure: RIGHT TONSIL BIOPSY;  Surgeon: Melida Quitter, MD;  Location: [place];  Service: ENT;  Laterality: Right;   UPPER GASTROINTESTINAL ENDOSCOPY      I have reviewed the social history and family history with the patient and they are unchanged from previous note.  ALLERGIES:  is allergic to lisinopril, carvedilol, hydralazine, and lipitor [atorvastatin].  MEDICATIONS:  Current Outpatient Medications  Medication Sig Dispense Refill   acetaminophen (TYLENOL) 500 MG tablet Take 500-1,000 mg by mouth every 6 (  six) hours as needed for moderate pain or headache.     amLODipine (NORVASC) 10 MG tablet TAKE 1 TABLET EVERY DAY (Patient taking differently: Take 10 mg by mouth daily.) 90 tablet 0   ammonium lactate (LAC-HYDRIN) 12 % lotion Apply 1 Application topically as needed for dry skin. 400 g 5   capecitabine (XELODA) 500 MG tablet Take 2 tablets by mouth every 12 hours for 14 days, then off for 7 days. Repeat every 21 days. Take within 30 minutes after meals. 56 tablet 1   cholecalciferol (VITAMIN D3) 25 MCG (1000 UNIT) tablet Take 2 tablets (2,000 Units total) by mouth daily. (Patient taking differently: Take 10,000 Units by mouth daily.) 100 tablet 1    clopidogrel (PLAVIX) 75 MG tablet Take 1 tablet (75 mg total) by mouth daily. 90 tablet 1   colchicine 0.6 MG tablet Take 1 tablet (0.6 mg total) by mouth daily. (Patient not taking: Reported on 01/07/2022) 14 tablet 0   ferrous gluconate (FERGON) 324 MG tablet Take 1 tablet (324 mg total) by mouth daily with breakfast. 30 tablet 0   furosemide (LASIX) 40 MG tablet TAKE 1 TABLET TWICE DAILY 180 tablet 1   isosorbide mononitrate (IMDUR) 60 MG 24 hr tablet TAKE 1 AND 1/2 TABLETS EVERY DAY 135 tablet 3   labetalol (NORMODYNE) 200 MG tablet Take 1 tablet (200 mg total) by mouth 2 (two) times daily. 180 tablet 2   losartan (COZAAR) 100 MG tablet TAKE 1 TABLET EVERY DAY 90 tablet 0   mometasone (ELOCON) 0.1 % ointment Apply topically daily. (Patient taking differently: Apply 1 Application topically daily as needed (Eczema).) 45 g 0   Multiple Vitamin (MULTIVITAMIN WITH MINERALS) TABS tablet Take 1 tablet by mouth daily. 30 tablet 0   ondansetron (ZOFRAN) 4 MG tablet Take 1 tablet (4 mg total) by mouth every 8 (eight) hours as needed for nausea or vomiting. 20 tablet 0   polyethylene glycol (MIRALAX) packet Take 17 g by mouth daily as needed for mild constipation.     potassium chloride (KLOR-CON M) 10 MEQ tablet Take 1 tablet (10 mEq total) by mouth 2 (two) times daily. 14 tablet 0   pravastatin (PRAVACHOL) 80 MG tablet TAKE 1 TABLET EVERY DAY FOR HIGH CHOLESTEROL 90 tablet 1   vitamin B-12 (CYANOCOBALAMIN) 1000 MCG tablet Take 1,000 mcg by mouth daily.     No current facility-administered medications for this visit.    PHYSICAL EXAMINATION: ECOG PERFORMANCE STATUS: 1 - Symptomatic but completely ambulatory  There were no vitals filed for this visit. Wt Readings from Last 3 Encounters:  05/19/22 123 lb 12.8 oz (56.2 kg)  05/14/22 120 lb 5 oz (54.6 kg)  04/10/22 120 lb (54.4 kg)     No vitals taken today, Exam not performed today  LABORATORY DATA:  I have reviewed the data as listed     Latest Ref Rng & Units 05/14/2022   12:29 PM 01/21/2022    1:58 PM 11/25/2021   10:09 AM  CBC  WBC 4.0 - 10.5 K/uL 3.9  4.5  5.4   Hemoglobin 12.0 - 15.0 g/dL 11.7  10.0  9.6   Hematocrit 36.0 - 46.0 % 35.1  29.7  28.2   Platelets 150 - 400 K/uL 201  190  248         Latest Ref Rng & Units 05/14/2022   12:29 PM 01/21/2022    1:58 PM 11/25/2021   10:09 AM  CMP  Glucose 70 - 99  mg/dL 109  94  94   BUN 8 - 23 mg/dL _0 Creatinine 0.44 - 1.00 mg/dL 1.37  1.31  1.20   Sodium 135 - 145 mmol/L 142  140  140   Potassium 3.5 - 5.1 mmol/L 3.7  3.3  3.3   Chloride 98 - 111 mmol/L 105  103  109   CO2 22 - 32 mmol/L _1 Calcium 8.9 - 10.3 mg/dL 9.5  9.1  8.4   Total Protein 6.5 - 8.1 g/dL 7.6  7.1  6.2   Total Bilirubin 0.3 - 1.2 mg/dL 0.5  0.5  0.5   Alkaline Phos 38 - 126 U/L 121  78  74   AST 15 - 41 U/L _2 ALT 0 - 44 U/L _3 RADIOGRAPHIC STUDIES: I have personally reviewed the radiological images as listed and agreed with the findings in the report. No results found.    No orders of the defined types were placed in this encounter.  All questions were answered. The patient knows to call the clinic with any problems, questions or concerns. No barriers to learning was detected. The total time spent in the appointment was 30 minutes.     Truitt Merle, MD 05/28/2022   I, Wilburn Mylar, am acting as scribe for Truitt Merle, MD.   I have reviewed the above documentation for accuracy and completeness, and I agree with the above.

## 2022-05-29 ENCOUNTER — Other Ambulatory Visit: Payer: Self-pay

## 2022-06-02 NOTE — Progress Notes (Signed)
[facility] OFFICE PROGRESS NOTE  Tina Pier, MD [address] [place] [address] [place] [address]  DIAGNOSIS: f/u of rectal cancer   Oncology History  Rectal cancer ([place])  08/15/2021 Cancer Staging   Staging form: Colon and Rectum, AJCC 8th Edition - Clinical stage from 08/15/2021: Stage I (cT2, cN0, cM0) - Signed by Tina Merle, MD on 09/05/2021 Stage prefix: Initial diagnosis Total positive nodes: 0   08/15/2021 Procedure   Colonoscopy, Dr. Rush Waller  Impression: - Palpable rectal mass found on digital rectal exam. - The examined portion of the ileum was normal. - Four 3 to 7 mm polyps at the recto-sigmoid colon, in the descending colon and in the ascending colon, removed with a cold snare. Resected and retrieved. - Rule out malignancy, partially obstructing tumor in the proximal/middle rectum. Biopsied. Tattooed proximal to lesion. - Diverticulosis in the entire examined colon. - Normal mucosa in the entire examined colon otherwise. - Non-bleeding non-thrombosed external and internal hemorrhoids.   08/15/2021 Initial Biopsy   FINAL MICROSCOPIC DIAGNOSIS:   A. STOMACH, BIOPSY:  - Gastric antral and oxyntic mucosa with nonspecific reactive  gastropathy  - Helicobacter pylori-like organisms are not identified on routine HE stain   B. DUODENUM, BIOPSY:  - Duodenal mucosa with no specific histopathologic changes  - Negative for increased intraepithelial lymphocytes or villous  architectural changes   C. COLON, ASCENDING, DESCENDING AND SIGMOID, POLYPECTOMY:  - Tubular adenoma(s)  - Negative for high-grade dysplasia or malignancy   D. RECTAL MASS, BIOPSY:  - Fragments of an adenoma with multifocal high-grade dysplasia, focally suspicious for intramucosal carcinoma.  See comment    COMMENT:  D.  A deeper, more severe process cannot be ruled out.  Clinical and endoscopic correlation is suggested.    08/16/2021 Imaging   EXAM: CT CHEST, ABDOMEN, AND PELVIS  WITH CONTRAST  IMPRESSION: 1. Large rectal mass. Two tiny hypodense lesions in the liver are nonspecific with regard to benign etiology or metastatic disease. 2. 9 mm in long axis hypodense pancreatic body lesion. Possibilities include postinflammatory cyst, intraductal papillary mucinous neoplasm, or less likely pancreatic adenocarcinoma. 3. Bilateral renal cysts. A complex right kidney upper pole lesion is indeterminate for complex cyst versus mass and measures 9 mm in diameter. 4. In light of these findings, consider upper abdominal MRI with contrast to further characterize the hepatic, pancreatic, and renal lesions. 5. There is also unusual left perirenal stranding especially adjacent to a cyst along the left kidney lower pole, this could be an indicator of inflammation involving the left kidney but is technically nonspecific. Some of this edema tracks along the left paracolic gutter. 6. Advanced systemic atherosclerosis. This includes aortic and coronary atherosclerosis along with mesenteric and renal atherosclerosis. If further workup of the patient's severe atherosclerotic vascular disease is warranted, CT angiography would be recommended. 7. Small bilateral pleural effusions with passive atelectasis. 8. Severe centrilobular emphysema.  Emphysema (ICD10-J43.9). 9. Mild thoracolumbar scoliosis. 10. Scattered sigmoid colon diverticula.   08/18/2021 Imaging   EXAM: MRI ABDOMEN WITHOUT AND WITH CONTRAST (INCLUDING MRCP)  IMPRESSION: 1. Study is severely limited by beam hardening artifact from a large surgical clip in the stomach, which completely obscures the previously noted pancreatic lesion on most pulse sequences rendering today's study nondiagnostic. The only visualized hepatic lesion is compatible with a small simple hepatic cyst. 2. There is abnormal signal intensity and small rim enhancing lesions in the right psoas muscle. Clinical correlation for signs and symptoms of  psoas  abscess is recommended. Alternatively, the possibility of intramuscular metastasis could be considered. 3. Small bilateral pleural effusions lying dependently with areas of presumed passive atelectasis in the lower lobes of the lungs bilaterally. 4. Multiple simple cysts in the kidneys bilaterally. 5. Aortic atherosclerosis.   08/20/2021 Initial Diagnosis   Rectal cancer ([place])   08/21/2021 Imaging   EXAM: MRI PELVIS WITHOUT CONTRAST  IMPRESSION: Rectal adenocarcinoma T stage: T2   Rectal adenocarcinoma N stage:  N0   Distance from tumor to the internal anal sphincter is 4.5 cm.   06/09/2022 -  Chemotherapy   Patient is on Treatment Plan : COLORECTAL Panitumumab q14d ([facility] - Type Gene Only)       CURRENT THERAPY: To start Xeloda, q21d             -dose: 1037m q12hr, days 1-14 To start Vectibix every 2 weeks   INTERVAL HISTORY: Tina STRAHM81y.o. female returns to clinic today for follow-up visit.  Patient is feeling fairly well today without any concerning complaints.  She is going to undergo her first cycle of IV Vectibix today.  Denies any complaints.  She has a stable appetite.  Her weight is stable.  Denies any fever, chills, night sweats, or weight loss.  Denies any chest pain, shortness of breath, cough, or hemoptysis.  Denies any abnormal bleeding or blood in the stool.  Denies any nausea, vomiting, diarrhea, or constipation.  Denies any headache or visual changes.  Denies any current rashes or skin changes.  The patient recently had a follow-up with her PCP due to her uncontrolled hypertension.  Per chart review, it appears that the patient often has high blood pressure upon coming to the clinic.  She is currently prescribed amlodipine, labetalol, Imdur, and losartan.  She is compliant with this.  She states that she monitors her blood pressure at home and despite being on these medications that she continues to have hypertension.  She is asymptomatic.  She is  here today for evaluation repeat blood work before undergoing cycle #1 with Vectibix.  She took her first tablet of Xeloda as scheduled today.  MEDICAL HISTORY: Past Medical History:  Diagnosis Date   Anemia    Anxiety    Cancer ([facility]    rectal   Cataract    Depression    Diabetes mellitus without complication ([place]    pt denies   GERD (gastroesophageal reflux disease)    Gout    Headache    Hyperlipidemia    Hypertension    Oxygen deficiency    wears oxygen at noc   St[facility] 2008   09/14/2018   Tonsillar mass    Type 2 diabetes mellitus ([place] 09/14/2018   Vertigo    Wears dentures    upper   Wears glasses     ALLERGIES:  is allergic to lisinopril, carvedilol, hydralazine, and lipitor [atorvastatin].  MEDICATIONS:  Current Outpatient Medications  Medication Sig Dispense Refill   clindamycin (CLINDAGEL) 1 % gel Apply topically 2 (two) times daily as needed. 30 g 0   acetaminophen (TYLENOL) 500 MG tablet Take 500-1,000 mg by mouth every 6 (six) hours as needed for moderate pain or headache.     amLODipine (NORVASC) 10 MG tablet TAKE 1 TABLET EVERY DAY (Patient taking differently: Take 10 mg by mouth daily.) 90 tablet 0   ammonium lactate (LAC-HYDRIN) 12 % lotion Apply 1 Application topically as needed for dry skin. 400 g 5   capecitabine (XELODA) 500  MG tablet Take 2 tablets by mouth every 12 hours for 14 days, then off for 7 days. Repeat every 21 days. Take within 30 minutes after meals. 56 tablet 1   cholecalciferol (VITAMIN D3) 25 MCG (1000 UNIT) tablet Take 2 tablets (2,000 Units total) by mouth daily. (Patient taking differently: Take 10,000 Units by mouth daily.) 100 tablet 1   clopidogrel (PLAVIX) 75 MG tablet Take 1 tablet (75 mg total) by mouth daily. 90 tablet 1   colchicine 0.6 MG tablet Take 1 tablet (0.6 mg total) by mouth daily. (Patient not taking: Reported on 01/07/2022) 14 tablet 0   ferrous gluconate (FERGON) 324 MG tablet Take 1 tablet (324 mg total) by  mouth daily with breakfast. 30 tablet 0   furosemide (LASIX) 40 MG tablet TAKE 1 TABLET TWICE DAILY 180 tablet 1   isosorbide mononitrate (IMDUR) 60 MG 24 hr tablet TAKE 1 AND 1/2 TABLETS EVERY DAY 135 tablet 3   labetalol (NORMODYNE) 200 MG tablet Take 1 tablet (200 mg total) by mouth 2 (two) times daily. 180 tablet 2   losartan (COZAAR) 100 MG tablet TAKE 1 TABLET EVERY DAY 90 tablet 0   mometasone (ELOCON) 0.1 % ointment Apply topically daily. (Patient taking differently: Apply 1 Application topically daily as needed (Eczema).) 45 g 0   Multiple Vitamin (MULTIVITAMIN WITH MINERALS) TABS tablet Take 1 tablet by mouth daily. 30 tablet 0   ondansetron (ZOFRAN) 4 MG tablet Take 1 tablet (4 mg total) by mouth every 8 (eight) hours as needed for nausea or vomiting. 20 tablet 0   polyethylene glycol (MIRALAX) packet Take 17 g by mouth daily as needed for mild constipation.     potassium chloride (KLOR-CON M) 10 MEQ tablet Take 1 tablet (10 mEq total) by mouth 2 (two) times daily. 14 tablet 0   pravastatin (PRAVACHOL) 80 MG tablet TAKE 1 TABLET EVERY DAY FOR HIGH CHOLESTEROL 90 tablet 1   vitamin B-12 (CYANOCOBALAMIN) 1000 MCG tablet Take 1,000 mcg by mouth daily.     No current facility-administered medications for this visit.   Facility-Administered Medications Ordered in Other Visits  Medication Dose Route Frequency Provider Last Rate Last Admin   panitumumab (VECTIBIX) 300 mg in sodium chloride 0.9 % 100 mL chemo infusion  5.9 mg/kg (Treatment Plan) Intravenous Once Tina Merle, MD 115 mL/hr at 06/09/22 1640 300 mg at 06/09/22 1640    SURGICAL HISTORY:  Past Surgical History:  Procedure Laterality Date   ABDOMINAL HYSTERECTOMY     BIOPSY  08/15/2021   Procedure: BIOPSY;  Surgeon: Irving Copas., MD;  Location: [facility];  Service: Gastroenterology;;   BIOPSY  04/17/2022   Procedure: BIOPSY;  Surgeon: Irving Copas., MD;  Location: [facility];  Service: Gastroenterology;;    BREAST SURGERY     lumpectomy   BUNIONECTOMY     CATARACT EXTRACTION Left    COLONOSCOPY     COLONOSCOPY W/ BIOPSIES AND POLYPECTOMY     COLONOSCOPY WITH PROPOFOL N/A 08/15/2021   Procedure: COLONOSCOPY WITH PROPOFOL;  Surgeon: Irving Copas., MD;  Location: [facility];  Service: Gastroenterology;  Laterality: N/A;   ESOPHAGOGASTRODUODENOSCOPY (EGD) WITH PROPOFOL N/A 08/15/2021   Procedure: ESOPHAGOGASTRODUODENOSCOPY (EGD) WITH PROPOFOL;  Surgeon: Tina Waller Telford Nab., MD;  Location: [facility];  Service: Gastroenterology;  Laterality: N/A;   FLEXIBLE SIGMOIDOSCOPY N/A 04/17/2022   Procedure: FLEXIBLE SIGMOIDOSCOPY;  Surgeon: Tina Waller Telford Nab., MD;  Location: [facility];  Service: Gastroenterology;  Laterality: N/A;   HEMOSTASIS CLIP PLACEMENT  08/15/2021   Procedure: HEMOSTASIS CLIP PLACEMENT;  Surgeon: Irving Copas., MD;  Location: [facility];  Service: Gastroenterology;;   HOT HEMOSTASIS N/A 08/15/2021   Procedure: HOT HEMOSTASIS (ARGON PLASMA COAGULATION/BICAP);  Surgeon: Irving Copas., MD;  Location: [facility];  Service: Gastroenterology;  Laterality: N/A;   MULTIPLE TOOTH EXTRACTIONS     POLYPECTOMY  08/15/2021   Procedure: POLYPECTOMY;  Surgeon: Mansouraty, Telford Nab., MD;  Location: [facility];  Service: Gastroenterology;;   SUBMUCOSAL TATTOO INJECTION  08/15/2021   Procedure: SUBMUCOSAL TATTOO INJECTION;  Surgeon: Irving Copas., MD;  Location: [facility];  Service: Gastroenterology;;   TONGUE BIOPSY Right 12/13/2018   Procedure: RIGHT TONSIL BIOPSY;  Surgeon: Melida Quitter, MD;  Location: [facility];  Service: ENT;  Laterality: Right;   UPPER GASTROINTESTINAL ENDOSCOPY      REVIEW OF SYSTEMS:   Review of Systems  Constitutional: Negative for appetite change, chills, fatigue, fever and unexpected weight change.  HENT: Negative for mouth sores, nosebleeds, sore throat and trouble swallowing.   Eyes: Negative for eye  problems and icterus.  Respiratory: Negative for cough, hemoptysis, shortness of breath and wheezing.   Cardiovascular: Negative for chest pain and leg swelling.  Gastrointestinal: Negative for abdominal pain, constipation, diarrhea, nausea and vomiting.  Genitourinary: Negative for bladder incontinence, difficulty urinating, dysuria, frequency and hematuria.   Musculoskeletal: Negative for back pain, gait problem, neck pain and neck stiffness.  Skin: Negative for itching and rash.  Neurological: Negative for dizziness, extremity weakness, gait problem, headaches, light-headedness and seizures.  Hematological: Negative for adenopathy. Does not bruise/bleed easily.  Psychiatric/Behavioral: Negative for confusion, depression and sleep disturbance. The patient is not nervous/anxious.     PHYSICAL EXAMINATION:  Blood pressure (!) 180/86, pulse 76, temperature 98.2 F (36.8 C), temperature source Oral, resp. rate 16, weight 122 lb 6.4 oz (55.5 kg), SpO2 96 %.  ECOG PERFORMANCE STATUS: 1  Physical Exam  Constitutional: Oriented to person, place, and time and well-developed, well-nourished, and in no distress.  HENT:  Head: Normocephalic and atraumatic.  Mouth/Throat: Oropharynx is clear and moist. No oropharyngeal exudate.  Eyes: Conjunctivae are normal. Right eye exhibits no discharge. Left eye exhibits no discharge. No scleral icterus.  Neck: Normal range of motion. Neck supple.  Cardiovascular: Normal rate, regular rhythm, normal heart sounds and intact distal pulses.   Pulmonary/Chest: Effort normal and breath sounds normal. No respiratory distress. No wheezes. No rales.  Abdominal: Soft. Bowel sounds are normal. Exhibits no distension and no mass. There is no tenderness.  Musculoskeletal: Normal range of motion. Exhibits no edema.  Lymphadenopathy:    No cervical adenopathy.  Neurological: Alert and oriented to person, place, and time. Exhibits normal muscle tone. Gait normal.  Coordination normal.  Skin: Skin is warm and dry. No rash noted. Not diaphoretic. No erythema. No pallor.  Psychiatric: Mood, memory and judgment normal.  Vitals reviewed.  LABORATORY DATA: Lab Results  Component Value Date   WBC 5.3 06/09/2022   HGB 10.9 (L) 06/09/2022   HCT 32.6 (L) 06/09/2022   MCV 93.7 06/09/2022   PLT 197 06/09/2022      Chemistry      Component Value Date/Time   NA 139 06/09/2022 1503   NA 142 04/19/2021 1459   K 3.9 06/09/2022 1503   CL 105 06/09/2022 1503   CO2 25 06/09/2022 1503   BUN 27 (H) 06/09/2022 1503   BUN 37 (H) 04/19/2021 1459   CREATININE 1.41 (H) 06/09/2022 1503   CREATININE 1.35 (H)  04/21/2016 1437      Component Value Date/Time   CALCIUM 9.6 06/09/2022 1503   ALKPHOS 115 06/09/2022 1503   AST 13 (L) 06/09/2022 1503   ALT 5 06/09/2022 1503   BILITOT 0.5 06/09/2022 1503       RADIOGRAPHIC STUDIES:  CT CHEST ABDOMEN PELVIS WO CONTRAST  Result Date: 05/27/2022 CLINICAL DATA:  81 year old female presents for evaluation of rectal cancer on follow-up. * Tracking Code: BO * EXAM: CT CHEST, ABDOMEN AND PELVIS WITHOUT CONTRAST TECHNIQUE: Multidetector CT imaging of the chest, abdomen and pelvis was performed following the standard protocol without IV contrast. RADIATION DOSE REDUCTION: This exam was performed according to the departmental dose-optimization program which includes automated exposure control, adjustment of the mA and/or kV according to patient size and/or use of iterative reconstruction technique. COMPARISON:  August 16, 2021. FINDINGS: CT CHEST FINDINGS Cardiovascular: Stable appearance of the heart great vessels with moderate to marked aortic atherosclerosis and signs of coronary artery disease. Main pulmonary artery approximately 3.5 cm. Undo that Mediastinum/Nodes: Anterior mediastinal low-density well-circumscribed nodular area (image 37/2) 14 x 12 mm as compared to 14 x 8 mm. Internal density measuring 4 Hounsfield units. No  signs of frank adenopathy in the chest or other change from previous imaging. Lungs/Pleura: Signs of pulmonary emphysema moderate to marked both centrilobular and paraseptal and worse towards the lung apices. No suspicious pulmonary nodule, consolidation, edema or effusion. Airways are patent. Musculoskeletal: Please see below for full musculoskeletal details. There is no chest wall mass. CT ABDOMEN PELVIS FINDINGS Hepatobiliary: Hypodense lesion in the RIGHT hepatic lobe measures 8 mm previously 6 mm. The prior study was hampered by respiratory motion in this location. Internal density of this area 11 Hounsfield units. Favor hepatic cyst. No additional lesion seen on noncontrast imaging in the liver. Post cholecystectomy. No signs of biliary duct dilation. Pancreas: Pancreas with normal contours, no signs of inflammation or peripancreatic fluid., lesion seen on prior imaging not demonstrated on the current noncontrast evaluation. Spleen: Mildly lobular splenic contour with normal size spleen. Adrenals/Urinary Tract: Nephrolithiasis and vascular calcifications of the bilateral kidneys, single calculus on the LEFT in the interpolar aspect of the LEFT kidney. Low-density lesions in the bilateral kidneys compatible with cysts, not substantially changed. No additional dedicated imaging follow-up is recommended for these renal findings. However, on the previous study there was a more nodular area along the medial upper pole the RIGHT kidney that measures 39 Hounsfield units density on the current exam and approximately 9 mm size (image 56/2) findings indeterminate. No perivesical stranding.  No hydronephrosis. Stomach/Bowel: Stranding about the rectum at the site of rectal thickening. Rectal thickening may be less pronounced than on previous imaging but is not well evaluated. Suspect wall thickening up to 13 mm, perhaps is great as 18 mm previously. No acute gastrointestinal findings. Diverticulosis of the colon and  moderate stool in the proximal colon without substantial distension. Vascular/Lymphatic: Calcified atheromatous plaque of the abdominal aorta without aneurysmal dilation. Smooth contour of the IVC. Interval enlargement of groin lymph nodes bilaterally, 13 mm on the RIGHT (image 122/2) 13 mm on the LEFT (image 113/2) increase in number of groin lymph nodes bilaterally. No pelvic sidewall adenopathy. No retroperitoneal adenopathy. Reproductive: Post hysterectomy. Other: No ascites. Musculoskeletal: No acute bone finding. No destructive bone process. Spinal degenerative changes. IMPRESSION: 1. Rectal thickening may be less pronounced than on previous imaging but is not well evaluated. Suspect wall thickening up to 13 mm, perhaps is great as 18 mm  previously. 2. Interval enlargement of groin lymph nodes bilaterally, 13 mm on the RIGHT 13 mm on the LEFT. Findings are nonspecific and could be reactive in the appropriate clinical setting, correlate with any reason for reactive lymph nodes, aside from ongoing therapy. Focused ultrasound assessment may be helpful to determine whether biopsy may be warranted. Alternatively could consider short interval follow-up. 3. Anterior mediastinal low-density well-circumscribed nodular area measures 14 x 12 mm as compared to 14 x 8 mm. Internal density measuring 4 Hounsfield units. This may be a small cystic lesion but does show some slight interval enlargement. Short-term interval follow-up could be considered. If there is continued interval enlargement or there are discordant increases in tumor markers could consider MRI even without contrast for further assessment. 4. Question of small solid lesion in the upper pole the RIGHT kidney. This is not well evaluated. Differential includes hemorrhagic renal cyst versus is small solid renal neoplasm. Could consider follow-up MRI of the abdomen or attention on subsequent imaging as warranted. 5. Signs of pulmonary emphysema moderate to marked  both centrilobular and paraseptal and worse towards the lung apices. 6. Nephrolithiasis and vascular calcifications of the bilateral kidneys, single calculus on the LEFT in the interpolar aspect of the LEFT kidney. 7. Hypodense lesion in the RIGHT hepatic lobe measures 8 mm previously 6 mm. The prior study was hampered by respiratory motion in this location. Internal density of this area 11 Hounsfield units. Favor hepatic cyst. Attention on follow-up. 8. Aortic atherosclerosis and signs of coronary artery disease. 9. Main pulmonary artery approximately 3.5 cm. This can be seen in the setting of pulmonary arterial hypertension. Aortic Atherosclerosis (ICD10-I70.0) and Emphysema (ICD10-J43.9). Electronically Signed   By: Zetta Bills M.D.   On: 05/27/2022 12:23     ASSESSMENT/PLAN:  KESLEIGH MORSON is a 81 y.o. female with    1. Rectal Adenocarcinoma, stage I c(T2, N0)M0, MSS, KRAS/NRAS/BRAF wild type  -diagnosed 08/2021 by colonoscopy for black stool.  -staging CT CAP negative for metastatic disease. Baseline CEA on 08/16/21 was normal at 3.7. -she met Dr. Marcello Moores on 09/23/21. Pt declined surgery due to concern for side effects/quality of life as she lives alone. -s/p concurrent chemoradiation with Xeloda 10/21/21 - 11/28/21. Tolerated well overall. -restaging pelvis MRI 02/24/22 showed: reduction in volume of rectal carcinoma, remains stage T2 N0. -sigmoidoscopy on 04/17/22 with Dr. Rush Waller showed 3 cm non-obstructing mass at 9-12 cm in proximal rectum. Biopsy of the mass confirmed at least intramucosal carcinoma in background of extensive high-grade dysplasia. -restaging CT CAP on 05/26/22 showed: rectal wall thickening, not well evaluated; interval enlargement of groin lymph nodes bilaterally, Dr. Burr Medico previously discussed that this could be related to her rectal cancer.  Given her advanced age and no plan for surgery, Dr. Burr Medico do not plan to do a biopsy.  -FoundationOne obtained from her recent  sigmoidoscopy biopsy showed MSI-stable.  KRAS, NRAS, and BRAF wild-type.  She is a candidate for Vectibix.   -Labs were reviewed today, we discussed the potential benefit and side effects, especially skin rash, and diarrhea and management. She is interested, agreed to proceed today as scheduled.  I did send the patient clindamycin cream in the event that she develops a rash.  She knows that this is available at her pharmacy if needed. -Recommend that she proceed today as scheduled.  -We will see her back for a follow up in 2 weeks for evaluation and repeat blood work before starting cycle #2.     2.  Anemia -secondary to GI blood loss and CKD -Overall, Hbg stable to slightly worsened. She denies bleeding. Will continue to monitor.    3. Social Support -she previously lived independently and did not require assistance. -she is currently in an apartment and has support from some of her neighbors. -her son, who lives in [place], is currently in the ICU following an accident at his job. -she requests transportation assistance. We will connect her with our service coordinator.  4. Hypertension -Per chart review, it appears that the patient often has hypertension at her visits with her blood pressure typically around 200/379 systolic. -BP today 180/86.  The patient took her antihypertensive today as prescribed.  She has not taken her evening dose of her labetalol today.  She is asymptomatic.  -We will give the patient 0.1 of clonidine today.  The patient should not drive to her appointment and has a driver.  Discussed this can cause drowsiness.  I instructed that she check her blood pressure closely at home and keep a log of her readings.  If her blood pressure continues to be out of goal even with taking her antihypertensives as prescribed, I recommend that she call her PCP for further evaluation and perhaps further evaluation for possible secondary causes of hypertension.  He is agreeable with this  plan.     PLAN: -Labs were reviewed, recommend she proceed with cycle #1 today scheduled -Clindamycin gel sent to the pharmacy if needed. -0.1 of clonidine today  -Labs, follow-up visit, and Vectibix in 2 weeks on 06/23/2022     No orders of the defined types were placed in this encounter.    The total time spent in the appointment was 20-29 minutes  Riddhi Grether L Mendy Lapinsky, PA-C 06/09/22

## 2022-06-03 ENCOUNTER — Ambulatory Visit: Payer: Medicare HMO | Admitting: Hematology

## 2022-06-03 ENCOUNTER — Other Ambulatory Visit: Payer: Medicare HMO

## 2022-06-09 ENCOUNTER — Other Ambulatory Visit: Payer: Self-pay

## 2022-06-09 ENCOUNTER — Other Ambulatory Visit (HOSPITAL_COMMUNITY): Payer: Self-pay

## 2022-06-09 ENCOUNTER — Inpatient Hospital Stay: Payer: Medicare HMO

## 2022-06-09 ENCOUNTER — Inpatient Hospital Stay (HOSPITAL_BASED_OUTPATIENT_CLINIC_OR_DEPARTMENT_OTHER): Payer: Medicare HMO | Admitting: Physician Assistant

## 2022-06-09 VITALS — BP 180/86 | HR 76 | Temp 98.2°F | Resp 16 | Wt 122.4 lb

## 2022-06-09 VITALS — BP 162/82 | HR 75 | Resp 16

## 2022-06-09 DIAGNOSIS — I10 Essential (primary) hypertension: Secondary | ICD-10-CM

## 2022-06-09 DIAGNOSIS — C20 Malignant neoplasm of rectum: Secondary | ICD-10-CM

## 2022-06-09 DIAGNOSIS — N189 Chronic kidney disease, unspecified: Secondary | ICD-10-CM | POA: Diagnosis not present

## 2022-06-09 DIAGNOSIS — D649 Anemia, unspecified: Secondary | ICD-10-CM | POA: Diagnosis not present

## 2022-06-09 DIAGNOSIS — K922 Gastrointestinal hemorrhage, unspecified: Secondary | ICD-10-CM

## 2022-06-09 DIAGNOSIS — Z5112 Encounter for antineoplastic immunotherapy: Secondary | ICD-10-CM | POA: Diagnosis not present

## 2022-06-09 DIAGNOSIS — Z79899 Other long term (current) drug therapy: Secondary | ICD-10-CM | POA: Diagnosis not present

## 2022-06-09 LAB — CBC WITH DIFFERENTIAL (CANCER CENTER ONLY)
Abs Immature Granulocytes: 0 10*3/uL (ref 0.00–0.07)
Basophils Absolute: 0 10*3/uL (ref 0.0–0.1)
Basophils Relative: 0 %
Eosinophils Absolute: 0.2 10*3/uL (ref 0.0–0.5)
Eosinophils Relative: 3 %
HCT: 32.6 % — ABNORMAL LOW (ref 36.0–46.0)
Hemoglobin: 10.9 g/dL — ABNORMAL LOW (ref 12.0–15.0)
Immature Granulocytes: 0 %
Lymphocytes Relative: 27 %
Lymphs Abs: 1.5 10*3/uL (ref 0.7–4.0)
MCH: 31.3 pg (ref 26.0–34.0)
MCHC: 33.4 g/dL (ref 30.0–36.0)
MCV: 93.7 fL (ref 80.0–100.0)
Monocytes Absolute: 0.7 10*3/uL (ref 0.1–1.0)
Monocytes Relative: 13 %
Neutro Abs: 3 10*3/uL (ref 1.7–7.7)
Neutrophils Relative %: 57 %
Platelet Count: 197 10*3/uL (ref 150–400)
RBC: 3.48 MIL/uL — ABNORMAL LOW (ref 3.87–5.11)
RDW: 12.9 % (ref 11.5–15.5)
WBC Count: 5.3 10*3/uL (ref 4.0–10.5)
nRBC: 0 % (ref 0.0–0.2)

## 2022-06-09 LAB — CMP (CANCER CENTER ONLY)
ALT: 5 U/L (ref 0–44)
AST: 13 U/L — ABNORMAL LOW (ref 15–41)
Albumin: 4.4 g/dL (ref 3.5–5.0)
Alkaline Phosphatase: 115 U/L (ref 38–126)
Anion gap: 9 (ref 5–15)
BUN: 27 mg/dL — ABNORMAL HIGH (ref 8–23)
CO2: 25 mmol/L (ref 22–32)
Calcium: 9.6 mg/dL (ref 8.9–10.3)
Chloride: 105 mmol/L (ref 98–111)
Creatinine: 1.41 mg/dL — ABNORMAL HIGH (ref 0.44–1.00)
GFR, Estimated: 38 mL/min — ABNORMAL LOW (ref >60)
Glucose, Bld: 99 mg/dL (ref 70–99)
Potassium: 3.9 mmol/L (ref 3.5–5.1)
Sodium: 139 mmol/L (ref 135–145)
Total Bilirubin: 0.5 mg/dL (ref 0.3–1.2)
Total Protein: 7.4 g/dL (ref 6.5–8.1)

## 2022-06-09 LAB — IRON AND IRON BINDING CAPACITY (CC-WL,HP ONLY)
Iron: 63 ug/dL (ref 28–170)
Saturation Ratios: 21 % (ref 10.4–31.8)
TIBC: 300 ug/dL (ref 250–450)
UIBC: 237 ug/dL (ref 148–442)

## 2022-06-09 LAB — MAGNESIUM: Magnesium: 1.7 mg/dL (ref 1.7–2.4)

## 2022-06-09 MED ORDER — SODIUM CHLORIDE 0.9 % IV SOLN
5.9000 mg/kg | Freq: Once | INTRAVENOUS | Status: AC
  Administered 2022-06-09: 300 mg via INTRAVENOUS
  Filled 2022-06-09: qty 15

## 2022-06-09 MED ORDER — SODIUM CHLORIDE 0.9 % IV SOLN: Freq: Once | INTRAVENOUS | Status: AC

## 2022-06-09 MED ORDER — CLINDAMYCIN PHOSPHATE 1 % EX GEL: Freq: Two times a day (BID) | CUTANEOUS | 0 refills | Status: DC | PRN

## 2022-06-09 NOTE — Patient Instructions (Signed)
[facility]  Discharge Instructions: Thank you for choosing [facility] to provide your oncology and hematology care.   If you have a lab appointment with the [facility], please go directly to the [facility] and check in at the registration area.   Wear comfortable clothing and clothing appropriate for easy access to any Portacath or PICC line.   We strive to give you quality time with your provider. You may need to reschedule your appointment if you arrive late (15 or more minutes).  Arriving late affects you and other patients whose appointments are after yours.  Also, if you miss three or more appointments without notifying the office, you may be dismissed from the clinic at the provider's discretion.      For prescription refill requests, have your pharmacy contact our office and allow 72 hours for refills to be completed.    Today you received the following chemotherapy and/or immunotherapy agents: panitumumab      To help prevent nausea and vomiting after your treatment, we encourage you to take your nausea medication as directed.  BELOW ARE SYMPTOMS THAT SHOULD BE REPORTED IMMEDIATELY: *FEVER GREATER THAN 100.4 F (38 C) OR HIGHER *CHILLS OR SWEATING *NAUSEA AND VOMITING THAT IS NOT CONTROLLED WITH YOUR NAUSEA MEDICATION *UNUSUAL SHORTNESS OF BREATH *UNUSUAL BRUISING OR BLEEDING *URINARY PROBLEMS (pain or burning when urinating, or frequent urination) *BOWEL PROBLEMS (unusual diarrhea, constipation, pain near the anus) TENDERNESS IN MOUTH AND THROAT WITH OR WITHOUT PRESENCE OF ULCERS (sore throat, sores in mouth, or a toothache) UNUSUAL RASH, SWELLING OR PAIN  UNUSUAL VAGINAL DISCHARGE OR ITCHING   Items with * indicate a potential emergency and should be followed up as soon as possible or go to the Emergency Department if any problems should occur.  Please show the CHEMOTHERAPY ALERT CARD or IMMUNOTHERAPY ALERT CARD at check-in  to the Emergency Department and triage nurse.  Should you have questions after your visit or need to cancel or reschedule your appointment, please contact [facility]  Dept: [phone]  and follow the prompts.  Office hours are 8:00 a.m. to 4:30 p.m. Monday - Friday. Please note that voicemails left after 4:00 p.m. may not be returned until the following business day.  We are closed weekends and major holidays. You have access to a nurse at all times for urgent questions. Please call the main number to the clinic Dept: [phone] and follow the prompts.   For any non-urgent questions, you may also contact your provider using MyChart. We now offer e-Visits for anyone 13 and older to request care online for non-urgent symptoms. For details visit [email or web address].   Also download the MyChart app! Go to the app store, search "MyChart", open the app, select [facility], and log in with your MyChart username and password.  Masks are optional in the cancer centers. If you would like for your care team to wear a mask while they are taking care of you, please let them know. You may have one support person who is at least 79 years old accompany you for your appointments.

## 2022-06-10 ENCOUNTER — Telehealth: Payer: Self-pay

## 2022-06-10 LAB — CEA (IN HOUSE-CHCC): CEA (CHCC-In House): 1.83 ng/mL (ref 0.00–5.00)

## 2022-06-10 LAB — FERRITIN: Ferritin: 36 ng/mL (ref 11–307)

## 2022-06-10 NOTE — Telephone Encounter (Signed)
-----   Message from Clyda Hurdle, RN sent at 06/09/2022  5:25 PM EST ----- Regarding: 1st Time Callback-Dr Burr Medico 1st Time panitumumab infusion. Dr Ernestina Penna patient. No issues during infusion.

## 2022-06-10 NOTE — Telephone Encounter (Signed)
Tina Waller states that she is doing fine.  She is eating, drinking, and urinating well. She knows to call the office at [phone] if she has any questions or concerns.

## 2022-06-18 ENCOUNTER — Other Ambulatory Visit: Payer: Self-pay

## 2022-06-18 ENCOUNTER — Emergency Department (HOSPITAL_COMMUNITY): Payer: Medicare HMO

## 2022-06-18 ENCOUNTER — Emergency Department (HOSPITAL_COMMUNITY)
Admission: EM | Admit: 2022-06-18 | Discharge: 2022-06-19 | Disposition: A | Discharge status: Home / Self Care | Payer: Medicare HMO | Attending: Emergency Medicine | Admitting: Emergency Medicine

## 2022-06-18 DIAGNOSIS — I10 Essential (primary) hypertension: Secondary | ICD-10-CM | POA: Diagnosis not present

## 2022-06-18 DIAGNOSIS — Z85048 Personal history of other malignant neoplasm of rectum, rectosigmoid junction, and anus: Secondary | ICD-10-CM | POA: Insufficient documentation

## 2022-06-18 DIAGNOSIS — H539 Unspecified visual disturbance: Secondary | ICD-10-CM | POA: Insufficient documentation

## 2022-06-18 DIAGNOSIS — E119 Type 2 diabetes mellitus without complications: Secondary | ICD-10-CM | POA: Diagnosis not present

## 2022-06-18 DIAGNOSIS — Z79899 Other long term (current) drug therapy: Secondary | ICD-10-CM | POA: Insufficient documentation

## 2022-06-18 DIAGNOSIS — R519 Headache, unspecified: Secondary | ICD-10-CM | POA: Insufficient documentation

## 2022-06-18 DIAGNOSIS — G4489 Other headache syndrome: Secondary | ICD-10-CM | POA: Diagnosis not present

## 2022-06-18 LAB — BASIC METABOLIC PANEL
Anion gap: 10 (ref 5–15)
BUN: 28 mg/dL — ABNORMAL HIGH (ref 8–23)
CO2: 23 mmol/L (ref 22–32)
Calcium: 8.9 mg/dL (ref 8.9–10.3)
Chloride: 109 mmol/L (ref 98–111)
Creatinine, Ser: 1.6 mg/dL — ABNORMAL HIGH (ref 0.44–1.00)
GFR, Estimated: 33 mL/min — ABNORMAL LOW (ref >60)
Glucose, Bld: 123 mg/dL — ABNORMAL HIGH (ref 70–99)
Potassium: 3.2 mmol/L — ABNORMAL LOW (ref 3.5–5.1)
Sodium: 142 mmol/L (ref 135–145)

## 2022-06-18 LAB — CBC WITH DIFFERENTIAL/PLATELET
Abs Immature Granulocytes: 0 10*3/uL (ref 0.00–0.07)
Basophils Absolute: 0 10*3/uL (ref 0.0–0.1)
Basophils Relative: 0 %
Eosinophils Absolute: 0.2 10*3/uL (ref 0.0–0.5)
Eosinophils Relative: 5 %
HCT: 33.1 % — ABNORMAL LOW (ref 36.0–46.0)
Hemoglobin: 11 g/dL — ABNORMAL LOW (ref 12.0–15.0)
Immature Granulocytes: 0 %
Lymphocytes Relative: 32 %
Lymphs Abs: 1.2 10*3/uL (ref 0.7–4.0)
MCH: 31.4 pg (ref 26.0–34.0)
MCHC: 33.2 g/dL (ref 30.0–36.0)
MCV: 94.6 fL (ref 80.0–100.0)
Monocytes Absolute: 0.5 10*3/uL (ref 0.1–1.0)
Monocytes Relative: 14 %
Neutro Abs: 1.9 10*3/uL (ref 1.7–7.7)
Neutrophils Relative %: 49 %
Platelets: 216 10*3/uL (ref 150–400)
RBC: 3.5 MIL/uL — ABNORMAL LOW (ref 3.87–5.11)
RDW: 12.6 % (ref 11.5–15.5)
WBC: 3.8 10*3/uL — ABNORMAL LOW (ref 4.0–10.5)
nRBC: 0 % (ref 0.0–0.2)

## 2022-06-18 NOTE — ED Triage Notes (Addendum)
Patient BIB EMS for evaluation of migraine HA x 1 day.  Patient is a cancer patient and is currently receiving chemo.

## 2022-06-18 NOTE — ED Provider Triage Note (Signed)
Emergency Medicine Provider Triage Evaluation Note  Tina Waller , a 81 y.o. female  was evaluated in triage.  Pt complains of headache.  Started acutely this morning with sudden onset, states mostly the same throughout the day but improved after Tylenol but then came back.  Associated with "black dots" in her peripheral vision in both eyes.  Denies any nausea, vomiting.  No history of headaches like this previously.  On chemotherapy for cancer currently.  Denies any neck pain, fevers, abdominal pain, chest pain, shortness of breath, diplopia..  Review of Systems  Per HPI  Physical Exam  BP (!) 160/70   Pulse 70   Temp 98.1 F (36.7 C) (Oral)   Resp 16   SpO2 96%  Gen:   Awake, no distress   Resp:  Normal effort  MSK:   Moves extremities without difficulty  Other:  Cranial nerves II through XII are grossly intact upper and lower extremity strength is symmetric bilaterally.   Medical Decision Making  Medically screening exam initiated at 10:17 PM.  Appropriate orders placed.  LUCEIL HERRIN was informed that the remainder of the evaluation will be completed by another provider, this initial triage assessment does not replace that evaluation, and the importance of remaining in the ED until their evaluation is complete.     Sherrill Raring, PA-C 06/18/22 2218

## 2022-06-19 MED ORDER — MAGNESIUM SULFATE IN D5W 1-5 GM/100ML-% IV SOLN
1.0000 g | Freq: Once | INTRAVENOUS | Status: AC
  Administered 2022-06-19: 1 g via INTRAVENOUS
  Filled 2022-06-19: qty 100

## 2022-06-19 MED ORDER — ACETAMINOPHEN 500 MG PO TABS
1000.0000 mg | ORAL_TABLET | Freq: Once | ORAL | Status: AC
  Administered 2022-06-19: 1000 mg via ORAL
  Filled 2022-06-19: qty 2

## 2022-06-19 NOTE — ED Provider Notes (Signed)
[facility] DEPT Provider Note   CSN: [id number] Arrival date & time: 06/18/22  2151     History  Chief Complaint  Patient presents with   Migraine    Tina Waller is a 81 y.o. female.  HPI   Patient with medical history including right cerebral infarct, gait impairment, diabetes, hypertension, hyperlipidemia, currently on dual antiplatelet therapy, rectal carcinoma currently on immunosuppressants presents with complaints of a headache.  Patient states headache started yesterday morning, started when she woke up, states that she feels a headache in the front of her head, states it comes and goes, typically last about 30 minutes, states that initially in the morning she had some right eye visual disturbances, states that she saw some black dots in the center of her eye, which is associated with a headache, she had it once in the morning once the afternoon, she currently has not had it since, she does have a headache right now without any visual disturbances, there is no associated slurred speech, no unilateral weakness paresthesias, no recent head trauma, not anticoag's, does not endorse any neck pain, no fevers or chills, no other complaints.     Home Medications Prior to Admission medications   Medication Sig Start Date End Date Taking? Authorizing Provider  acetaminophen (TYLENOL) 500 MG tablet Take 500-1,000 mg by mouth every 6 (six) hours as needed for moderate pain or headache.    [provider]  amLODipine (NORVASC) 10 MG tablet TAKE 1 TABLET EVERY DAY Patient taking differently: Take 10 mg by mouth daily. 08/08/21   [name], MD  ammonium lactate (LAC-HYDRIN) 12 % lotion Apply 1 Application topically as needed for dry skin. 04/30/22   Marzetta Board, DPM  capecitabine (XELODA) 500 MG tablet Take 2 tablets by mouth every 12 hours for 14 days, then off for 7 days. Repeat every 21 days. Take within 30 minutes after meals.  05/26/22   Truitt Merle, MD  cholecalciferol (VITAMIN D3) 25 MCG (1000 UNIT) tablet Take 2 tablets (2,000 Units total) by mouth daily. Patient taking differently: Take 10,000 Units by mouth daily. 11/14/20   [name], MD  clindamycin (CLINDAGEL) 1 % gel Apply topically 2 (two) times daily as needed. 06/09/22   Heilingoetter, Cassandra L, PA-C  clopidogrel (PLAVIX) 75 MG tablet Take 1 tablet (75 mg total) by mouth daily. 04/18/22   Mansouraty, Telford Nab., MD  colchicine 0.6 MG tablet Take 1 tablet (0.6 mg total) by mouth daily. Patient not taking: Reported on 01/07/2022 08/22/21   Little Ishikawa, MD  ferrous gluconate [facility]) 324 MG tablet Take 1 tablet (324 mg total) by mouth daily with breakfast. 08/23/21   Truitt Merle, MD  furosemide (LASIX) 40 MG tablet TAKE 1 TABLET TWICE DAILY 02/04/22   [name], MD  isosorbide mononitrate (IMDUR) 60 MG 24 hr tablet TAKE 1 AND 1/2 TABLETS EVERY DAY 05/26/22   [name], MD  labetalol (NORMODYNE) 200 MG tablet Take 1 tablet (200 mg total) by mouth 2 (two) times daily. 05/19/22   [name], MD  losartan (COZAAR) 100 MG tablet TAKE 1 TABLET EVERY DAY 03/12/22   [name], MD  mometasone (ELOCON) 0.1 % ointment Apply topically daily. Patient taking differently: Apply 1 Application topically daily as needed (Eczema). 07/26/20   Argentina Donovan, PA-C  Multiple Vitamin (MULTIVITAMIN WITH MINERALS) TABS tablet Take 1 tablet by mouth daily. 08/22/21   Little Ishikawa, MD  ondansetron (  ZOFRAN) 4 MG tablet Take 1 tablet (4 mg total) by mouth every 8 (eight) hours as needed for nausea or vomiting. 10/09/21   Truitt Merle, MD  polyethylene glycol [facility]) packet Take 17 g by mouth daily as needed for mild constipation. 09/15/18   Hongalgi, Lenis Dickinson, MD  potassium chloride (KLOR-CON M) 10 MEQ tablet Take 1 tablet (10 mEq total) by mouth 2 (two) times daily. 11/12/21   Truitt Merle, MD  pravastatin (PRAVACHOL) 80 MG tablet TAKE 1 TABLET  EVERY DAY FOR HIGH CHOLESTEROL 02/04/22   [name], MD  vitamin B-12 (CYANOCOBALAMIN) 1000 MCG tablet Take 1,000 mcg by mouth daily.    [provider]      Allergies    Lisinopril, Carvedilol, Hydralazine, and Lipitor [atorvastatin]    Review of Systems   Review of Systems  Constitutional:  Negative for chills and fever.  Respiratory:  Negative for shortness of breath.   Cardiovascular:  Negative for chest pain.  Gastrointestinal:  Negative for abdominal pain.  Neurological:  Positive for headaches.    Physical Exam Updated Vital Signs BP (!) 184/87   Pulse 73   Temp 98.1 F (36.7 C) (Oral)   Resp 17   SpO2 96%  Physical Exam Vitals and nursing note reviewed.  Constitutional:      General: She is not in acute distress.    Appearance: She is not ill-appearing.  HENT:     Head: Normocephalic and atraumatic.     Nose: No congestion.  Eyes:     Conjunctiva/sclera: Conjunctivae normal.  Cardiovascular:     Rate and Rhythm: Normal rate and regular rhythm.     Pulses: Normal pulses.  Pulmonary:     Effort: Pulmonary effort is normal.  Skin:    General: Skin is warm and dry.  Neurological:     Mental Status: She is alert.     GCS: GCS eye subscore is 4. GCS verbal subscore is 5. GCS motor subscore is 6.     Cranial Nerves: Cranial nerves 2-12 are intact.     Sensory: No sensory deficit.     Motor: No weakness.     Coordination: Romberg sign negative. Finger-Nose-Finger Test normal.     Gait: Gait is intact.     Comments: Cranial nerves II through XII grossly intact no difficulty with word finding, following two-step commands, there is no unilateral weakness present, gait fully intact.  Psychiatric:        Mood and Affect: Mood normal.     ED Results / Procedures / Treatments   Labs (all labs ordered are listed, but only abnormal results are displayed) Labs Reviewed  CBC WITH DIFFERENTIAL/PLATELET - Abnormal; Notable for the following components:       Result Value   WBC 3.8 (*)    RBC 3.50 (*)    Hemoglobin 11.0 (*)    HCT 33.1 (*)    All other components within normal limits  BASIC METABOLIC PANEL - Abnormal; Notable for the following components:   Potassium 3.2 (*)    Glucose, Bld 123 (*)    BUN 28 (*)    Creatinine, Ser 1.60 (*)    GFR, Estimated 33 (*)    All other components within normal limits    EKG None  Radiology CT Head Wo Contrast  Result Date: 06/18/2022 CLINICAL DATA:  Headache EXAM: CT HEAD WITHOUT CONTRAST TECHNIQUE: Contiguous axial images were obtained from the base of the skull through the vertex without intravenous  contrast. RADIATION DOSE REDUCTION: This exam was performed according to the departmental dose-optimization program which includes automated exposure control, adjustment of the mA and/or kV according to patient size and/or use of iterative reconstruction technique. COMPARISON:  CT brain 09/27/2018, MRI 09/27/2018 FINDINGS: Brain: No acute territorial infarction, hemorrhage or intracranial mass. The ventricles are nonenlarged. Atrophy and chronic small vessel ischemic changes of the white matter. Multiple small chronic infarcts in the right cerebellum, pons, left thalamus, and right white matter. Stable ventricle size. Vascular: No hyperdense vessels. Vertebral and carotid vascular calcification Skull: Normal. Negative for fracture or focal lesion. Sinuses/Orbits: No acute finding. Other: None IMPRESSION: 1. No CT evidence for acute intracranial abnormality. 2. Atrophy and chronic small vessel ischemic changes of the white matter. Multiple small chronic infarcts. Electronically Signed   By: Donavan Foil M.D.   On: 06/18/2022 22:51    Procedures Procedures    Medications Ordered in ED Medications  magnesium sulfate IVPB 1 g 100 mL (1 g Intravenous New Bag/Given 06/19/22 0257)  acetaminophen (TYLENOL) tablet 1,000 mg (1,000 mg Oral Given 06/19/22 0253)    ED Course/ Medical Decision Making/ A&P                            Medical Decision Making Risk OTC drugs. Prescription drug management.   This patient presents to the ED for concern of HA, this involves an extensive number of treatment options, and is a complaint that carries with it a high risk of complications and morbidity.  The differential diagnosis includes intracranial bleed, metastatic disease, TIA, CVA    Additional history obtained:  Additional history obtained from N/A External records from outside source obtained and reviewed including neurology notes   Co morbidities that complicate the patient evaluation  CVA, metastatic disease  Social Determinants of Health:  N/A    Lab Tests:  I Ordered, and personally interpreted labs.  The pertinent results include: CBC shows leukocytopenia with a white count of 3.8, normocytic anemia hemoglobin 11, BMP shows potassium 3.2, glucose 123, BUN 28, creatinine 1.6, GFR 33   Imaging Studies ordered:  I ordered imaging studies including CT head I independently visualized and interpreted imaging which showed negative acute findings I agree with the radiologist interpretation   Cardiac Monitoring:  The patient was maintained on a cardiac monitor.  I personally viewed and interpreted the cardiac monitored which showed an underlying rhythm of: N/A   Medicines ordered and prescription drug management:  I ordered medication including migraine cocktail I have reviewed the patients home medicines and have made adjustments as needed  Critical Interventions:  N/A   Reevaluation:  Triage obtain lab work and imaging which I personally reviewed, they are unremarkable, suspect patient suffering from likely a migraine will provide with a migraine cocktail and reassess  Reassessed states she is feeling much better, agreement with discharge at this time.    Consultations Obtained:  N/a    Test Considered:  MRI brain-deferred no indication for emergent MRI,  patient will likely need 1 as an outpatient.    Rule out low suspicion for internal head bleed and or mass as CT imaging is negative for acute findings.  Low suspicion for CVA she has no focal deficit present my exam.  Low suspicion for dissection of the vertebral or carotid artery as presentation atypical of etiology, no associated neck pain, could be atypical for pain to come and go but expected to be constant.  Suspicion for TIA is also low as there is no associated slurred speech unilateral weakness or paresthesias.  Low suspicion for meningitis as she has no meningeal sign present.     Dispostion and problem list  After consideration of the diagnostic results and the patients response to treatment, I feel that the patent would benefit from DC.  HA-suspect this is a complex migraine but due to her history of metastatic disease as well as CVAs in the past, would recommend that she follows up with her neurologist for likely a MRI.            Final Clinical Impression(s) / ED Diagnoses Final diagnoses:  Bad headache    Rx / DC Orders ED Discharge Orders     None         Marcello Fennel, PA-C 06/19/22 0400    Orpah Greek, MD 06/19/22 [phone]

## 2022-06-19 NOTE — Discharge Instructions (Signed)
Lab work imaging was all reassuring, recommend continue with your home medications,  I would like you to follow-up with your oncologist and/or neurologist for further evaluation as they might want to do outpatient imaging like a MRI and/or CT angio.  Come back to the emergency department if you develop chest pain, shortness of breath, severe abdominal pain, uncontrolled nausea, vomiting, diarrhea.

## 2022-06-20 ENCOUNTER — Other Ambulatory Visit (HOSPITAL_COMMUNITY): Payer: Self-pay

## 2022-06-20 NOTE — Progress Notes (Unsigned)
[place] [facility]   [phone] Fax:(336) [phone]   Clinic Follow up Note   Patient Care Team: [name], MD as PCP - General (Internal Medicine) Truitt Merle, MD as Consulting Physician (Hematology)  Date of Service:  06/20/2022  CHIEF COMPLAINT: f/u of rectal cancer   CURRENT THERAPY:   To start Xeloda, q21d             -dose: 1077m q12hr, days 1-14 To start Vectibix every 2 weeks   ASSESSMENT: *** Tina ROPERis a 81y.o. female with   No problem-specific Assessment & Plan notes found for this encounter.  ***   PLAN:  SUMMARY OF ONCOLOGIC HISTORY: Oncology History  [place]  08/15/2021 Cancer Staging   Staging form: Colon and Rectum, AJCC 8th Edition - Clinical stage from 08/15/2021: Stage I (cT2, cN0, cM0) - Signed by FTruitt Merle MD on 09/05/2021 Stage prefix: Initial diagnosis Total positive nodes: 0   08/15/2021 Procedure   Colonoscopy, Dr. MRush Landmark Impression: - Palpable rectal mass found on digital rectal exam. - The examined portion of the ileum was normal. - Four 3 to 7 mm polyps at the recto-sigmoid colon, in the descending colon and in the ascending colon, removed with a cold snare. Resected and retrieved. - Rule out malignancy, partially obstructing tumor in the proximal/middle rectum. Biopsied. Tattooed proximal to lesion. - Diverticulosis in the entire examined colon. - Normal mucosa in the entire examined colon otherwise. - Non-bleeding non-thrombosed external and internal hemorrhoids.   08/15/2021 Initial Biopsy   FINAL MICROSCOPIC DIAGNOSIS:   A. STOMACH, BIOPSY:  - Gastric antral and oxyntic mucosa with nonspecific reactive  gastropathy  - Helicobacter pylori-like organisms are not identified on routine HE stain   B. DUODENUM, BIOPSY:  - Duodenal mucosa with no specific histopathologic changes  - Negative for increased intraepithelial lymphocytes or villous  architectural changes   C. COLON,  ASCENDING, DESCENDING AND SIGMOID, POLYPECTOMY:  - Tubular adenoma(s)  - Negative for high-grade dysplasia or malignancy   D. RECTAL MASS, BIOPSY:  - Fragments of an adenoma with multifocal high-grade dysplasia, focally suspicious for intramucosal carcinoma.  See comment    COMMENT:  D.  A deeper, more severe process cannot be ruled out.  Clinical and endoscopic correlation is suggested.    08/16/2021 Imaging   EXAM: CT CHEST, ABDOMEN, AND PELVIS WITH CONTRAST  IMPRESSION: 1. Large rectal mass. Two tiny hypodense lesions in the liver are nonspecific with regard to benign etiology or metastatic disease. 2. 9 mm in long axis hypodense pancreatic body lesion. Possibilities include postinflammatory cyst, intraductal papillary mucinous neoplasm, or less likely pancreatic adenocarcinoma. 3. Bilateral renal cysts. A complex right kidney upper pole lesion is indeterminate for complex cyst versus mass and measures 9 mm in diameter. 4. In light of these findings, consider upper abdominal MRI with contrast to further characterize the hepatic, pancreatic, and renal lesions. 5. There is also unusual left perirenal stranding especially adjacent to a cyst along the left kidney lower pole, this could be an indicator of inflammation involving the left kidney but is technically nonspecific. Some of this edema tracks along the left paracolic gutter. 6. Advanced systemic atherosclerosis. This includes aortic and coronary atherosclerosis along with mesenteric and renal atherosclerosis. If further workup of the patient's severe atherosclerotic vascular disease is warranted, CT angiography would be recommended. 7. Small bilateral pleural effusions with passive atelectasis. 8. Severe centrilobular emphysema.  Emphysema (ICD10-J43.9). 9. Mild thoracolumbar scoliosis. 10. Scattered sigmoid  colon diverticula.   08/18/2021 Imaging   EXAM: MRI ABDOMEN WITHOUT AND WITH CONTRAST (INCLUDING  MRCP)  IMPRESSION: 1. Study is severely limited by beam hardening artifact from a large surgical clip in the stomach, which completely obscures the previously noted pancreatic lesion on most pulse sequences rendering today's study nondiagnostic. The only visualized hepatic lesion is compatible with a small simple hepatic cyst. 2. There is abnormal signal intensity and small rim enhancing lesions in the right psoas muscle. Clinical correlation for signs and symptoms of psoas abscess is recommended. Alternatively, the possibility of intramuscular metastasis could be considered. 3. Small bilateral pleural effusions lying dependently with areas of presumed passive atelectasis in the lower lobes of the lungs bilaterally. 4. Multiple simple cysts in the kidneys bilaterally. 5. Aortic atherosclerosis.   08/20/2021 Initial Diagnosis   Rectal cancer ([place])   08/21/2021 Imaging   EXAM: MRI PELVIS WITHOUT CONTRAST  IMPRESSION: Rectal adenocarcinoma T stage: T2   Rectal adenocarcinoma N stage:  N0   Distance from tumor to the internal anal sphincter is 4.5 cm.   06/09/2022 -  Chemotherapy   Patient is on Treatment Plan : COLORECTAL Panitumumab q14d ([place] - Type Gene Only)        INTERVAL HISTORY: *** Tina Waller is here for a follow up of  rectal cancer She was last seen by me on 05/28/2022 She presents to the clinic      All other systems were reviewed with the patient and are negative.  MEDICAL HISTORY:  Past Medical History:  Diagnosis Date   Anemia    Anxiety    Cancer ([place])    rectal   Cataract    Depression    Diabetes mellitus without complication ([place])    pt denies   GERD (gastroesophageal reflux disease)    Gout    Headache    Hyperlipidemia    Hypertension    Oxygen deficiency    wears oxygen at noc   Stroke [facility]) 2008   09/14/2018   Tonsillar mass    Type 2 diabetes mellitus ([place]) 09/14/2018   Vertigo    Wears dentures    upper   Wears glasses      SURGICAL HISTORY: Past Surgical History:  Procedure Laterality Date   ABDOMINAL HYSTERECTOMY     BIOPSY  08/15/2021   Procedure: BIOPSY;  Surgeon: Irving Copas., MD;  Location: [facility];  Service: Gastroenterology;;   BIOPSY  04/17/2022   Procedure: BIOPSY;  Surgeon: Irving Copas., MD;  Location: [facility];  Service: Gastroenterology;;   BREAST SURGERY     lumpectomy   BUNIONECTOMY     CATARACT EXTRACTION Left    COLONOSCOPY     COLONOSCOPY W/ BIOPSIES AND POLYPECTOMY     COLONOSCOPY WITH PROPOFOL N/A 08/15/2021   Procedure: COLONOSCOPY WITH PROPOFOL;  Surgeon: Irving Copas., MD;  Location: [place];  Service: Gastroenterology;  Laterality: N/A;   ESOPHAGOGASTRODUODENOSCOPY (EGD) WITH PROPOFOL N/A 08/15/2021   Procedure: ESOPHAGOGASTRODUODENOSCOPY (EGD) WITH PROPOFOL;  Surgeon: Rush Landmark Telford Nab., MD;  Location: [place];  Service: Gastroenterology;  Laterality: N/A;   FLEXIBLE SIGMOIDOSCOPY N/A 04/17/2022   Procedure: FLEXIBLE SIGMOIDOSCOPY;  Surgeon: Rush Landmark Telford Nab., MD;  Location: [facility];  Service: Gastroenterology;  Laterality: N/A;   HEMOSTASIS CLIP PLACEMENT  08/15/2021   Procedure: HEMOSTASIS CLIP PLACEMENT;  Surgeon: Irving Copas., MD;  Location: [facility];  Service: Gastroenterology;;   HOT HEMOSTASIS N/A 08/15/2021   Procedure: HOT HEMOSTASIS (ARGON PLASMA COAGULATION/BICAP);  Surgeon: Irving Copas., MD;  Location: [facility];  Service: Gastroenterology;  Laterality: N/A;   MULTIPLE TOOTH EXTRACTIONS     POLYPECTOMY  08/15/2021   Procedure: POLYPECTOMY;  Surgeon: Mansouraty, Telford Nab., MD;  Location: [facility];  Service: Gastroenterology;;   SUBMUCOSAL TATTOO INJECTION  08/15/2021   Procedure: SUBMUCOSAL TATTOO INJECTION;  Surgeon: Irving Copas., MD;  Location: [place];  Service: Gastroenterology;;   TONGUE BIOPSY Right 12/13/2018   Procedure: RIGHT TONSIL BIOPSY;   Surgeon: Melida Quitter, MD;  Location: [facility];  Service: ENT;  Laterality: Right;   UPPER GASTROINTESTINAL ENDOSCOPY      I have reviewed the social history and family history with the patient and they are unchanged from previous note.  ALLERGIES:  is allergic to lisinopril, carvedilol, hydralazine, and lipitor [atorvastatin].  MEDICATIONS:  Current Outpatient Medications  Medication Sig Dispense Refill   acetaminophen (TYLENOL) 500 MG tablet Take 500-1,000 mg by mouth every 6 (six) hours as needed for moderate pain or headache.     amLODipine (NORVASC) 10 MG tablet TAKE 1 TABLET EVERY DAY (Patient taking differently: Take 10 mg by mouth daily.) 90 tablet 0   ammonium lactate (LAC-HYDRIN) 12 % lotion Apply 1 Application topically as needed for dry skin. 400 g 5   capecitabine (XELODA) 500 MG tablet Take 2 tablets by mouth every 12 hours for 14 days, then off for 7 days. Repeat every 21 days. Take within 30 minutes after meals. 56 tablet 1   cholecalciferol (VITAMIN D3) 25 MCG (1000 UNIT) tablet Take 2 tablets (2,000 Units total) by mouth daily. (Patient taking differently: Take 10,000 Units by mouth daily.) 100 tablet 1   clindamycin (CLINDAGEL) 1 % gel Apply topically 2 (two) times daily as needed. 30 g 0   clopidogrel (PLAVIX) 75 MG tablet Take 1 tablet (75 mg total) by mouth daily. 90 tablet 1   colchicine 0.6 MG tablet Take 1 tablet (0.6 mg total) by mouth daily. (Patient not taking: Reported on 01/07/2022) 14 tablet 0   ferrous gluconate (FERGON) 324 MG tablet Take 1 tablet (324 mg total) by mouth daily with breakfast. 30 tablet 0   furosemide (LASIX) 40 MG tablet TAKE 1 TABLET TWICE DAILY 180 tablet 1   isosorbide mononitrate (IMDUR) 60 MG 24 hr tablet TAKE 1 AND 1/2 TABLETS EVERY DAY 135 tablet 3   labetalol (NORMODYNE) 200 MG tablet Take 1 tablet (200 mg total) by mouth 2 (two) times daily. 180 tablet 2   losartan (COZAAR) 100 MG tablet TAKE 1 TABLET EVERY DAY 90 tablet 0   mometasone  (ELOCON) 0.1 % ointment Apply topically daily. (Patient taking differently: Apply 1 Application topically daily as needed (Eczema).) 45 g 0   Multiple Vitamin (MULTIVITAMIN WITH MINERALS) TABS tablet Take 1 tablet by mouth daily. 30 tablet 0   ondansetron (ZOFRAN) 4 MG tablet Take 1 tablet (4 mg total) by mouth every 8 (eight) hours as needed for nausea or vomiting. 20 tablet 0   polyethylene glycol (MIRALAX) packet Take 17 g by mouth daily as needed for mild constipation.     potassium chloride (KLOR-CON M) 10 MEQ tablet Take 1 tablet (10 mEq total) by mouth 2 (two) times daily. 14 tablet 0   pravastatin (PRAVACHOL) 80 MG tablet TAKE 1 TABLET EVERY DAY FOR HIGH CHOLESTEROL 90 tablet 1   vitamin B-12 (CYANOCOBALAMIN) 1000 MCG tablet Take 1,000 mcg by mouth daily.     No current facility-administered medications for this visit.  PHYSICAL EXAMINATION: ECOG PERFORMANCE STATUS: {CHL ONC ECOG [phone]}  There were no vitals filed for this visit. Wt Readings from Last 3 Encounters:  06/09/22 122 lb 6.4 oz (55.5 kg)  05/19/22 123 lb 12.8 oz (56.2 kg)  05/14/22 120 lb 5 oz (54.6 kg)    {Only keep what was examined. If exam not performed, can use .CEXAM } GENERAL:alert, no distress and comfortable SKIN: skin color, texture, turgor are normal, no rashes or significant lesions EYES: normal, Conjunctiva are pink and non-injected, sclera clear {OROPHARYNX:no exudate, no erythema and lips, buccal mucosa, and tongue normal}  NECK: supple, thyroid normal size, non-tender, without nodularity LYMPH:  no palpable lymphadenopathy in the cervical, axillary {or inguinal} LUNGS: clear to auscultation and percussion with normal breathing effort HEART: regular rate & rhythm and no murmurs and no lower extremity edema ABDOMEN:abdomen soft, non-tender and normal bowel sounds Musculoskeletal:no cyanosis of digits and no clubbing  NEURO: alert & oriented x 3 with fluent speech, no focal motor/sensory  deficits  LABORATORY DATA:  I have reviewed the data as listed    Latest Ref Rng & Units 06/18/2022   10:32 PM 06/09/2022    3:03 PM 05/14/2022   12:29 PM  CBC  WBC 4.0 - 10.5 K/uL 3.8  5.3  3.9   Hemoglobin 12.0 - 15.0 g/dL 11.0  10.9  11.7   Hematocrit 36.0 - 46.0 % 33.1  32.6  35.1   Platelets 150 - 400 K/uL 216  197  201         Latest Ref Rng & Units 06/18/2022   10:32 PM 06/09/2022    3:03 PM 05/14/2022   12:29 PM  CMP  Glucose 70 - 99 mg/dL 123  99  109   BUN 8 - 23 mg/dL _0 Creatinine 0.44 - 1.00 mg/dL 1.60  1.41  1.37   Sodium 135 - 145 mmol/L 142  139  142   Potassium 3.5 - 5.1 mmol/L 3.2  3.9  3.7   Chloride 98 - 111 mmol/L 109  105  105   CO2 22 - 32 mmol/L _1 Calcium 8.9 - 10.3 mg/dL 8.9  9.6  9.5   Total Protein 6.5 - 8.1 g/dL  7.4  7.6   Total Bilirubin 0.3 - 1.2 mg/dL  0.5  0.5   Alkaline Phos 38 - 126 U/L  115  121   AST 15 - 41 U/L  13  16   ALT 0 - 44 U/L  5  11       RADIOGRAPHIC STUDIES: I have personally reviewed the radiological images as listed and agreed with the findings in the report. CT Head Wo Contrast  Result Date: 06/18/2022 CLINICAL DATA:  Headache EXAM: CT HEAD WITHOUT CONTRAST TECHNIQUE: Contiguous axial images were obtained from the base of the skull through the vertex without intravenous contrast. RADIATION DOSE REDUCTION: This exam was performed according to the departmental dose-optimization program which includes automated exposure control, adjustment of the mA and/or kV according to patient size and/or use of iterative reconstruction technique. COMPARISON:  CT brain 09/27/2018, MRI 09/27/2018 FINDINGS: Brain: No acute territorial infarction, hemorrhage or intracranial mass. The ventricles are nonenlarged. Atrophy and chronic small vessel ischemic changes of the white matter. Multiple small chronic infarcts in the right cerebellum, pons, left thalamus, and right white matter. Stable ventricle size. Vascular: No  hyperdense vessels. Vertebral and carotid vascular calcification Skull: Normal. Negative for fracture  or focal lesion. Sinuses/Orbits: No acute finding. Other: None IMPRESSION: 1. No CT evidence for acute intracranial abnormality. 2. Atrophy and chronic small vessel ischemic changes of the white matter. Multiple small chronic infarcts. Electronically Signed   By: Donavan Foil M.D.   On: 06/18/2022 22:51      No orders of the defined types were placed in this encounter.  All questions were answered. The patient knows to call the clinic with any problems, questions or concerns. No barriers to learning was detected. The total time spent in the appointment was {CHL ONC TIME VISIT - KPTWS:[id number]}.     Baldemar Friday, CMA 06/20/2022   I, Audry Riles, CMA, am acting as scribe for Truitt Merle, MD.   {Add scribe attestation statement}

## 2022-06-22 DIAGNOSIS — Z7189 Other specified counseling: Secondary | ICD-10-CM | POA: Insufficient documentation

## 2022-06-22 NOTE — Assessment & Plan Note (Signed)
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 -pt declined surgery  --s/p concurrent chemoradiation with Xeloda 10/21/21 - 11/28/21. Tolerated well overall.  -due to residual disease after chemoRT, she started Xeloda and Vectibix on 06/09/2022  -She tolerated first cycle very well, no noticeable side effects.

## 2022-06-22 NOTE — Assessment & Plan Note (Signed)
-  We discussed the incurable nature of her cancer, and the overall poor prognosis, especially if she does not have good response to chemotherapy or progress on chemo -The patient understands the goal of care is palliative. -I recommend DNR/DNI, she will think about it

## 2022-06-23 ENCOUNTER — Inpatient Hospital Stay: Payer: Medicare HMO

## 2022-06-23 ENCOUNTER — Encounter: Payer: Self-pay | Admitting: Hematology

## 2022-06-23 ENCOUNTER — Other Ambulatory Visit: Payer: Self-pay

## 2022-06-23 ENCOUNTER — Inpatient Hospital Stay: Payer: Medicare HMO | Attending: Hematology | Admitting: Hematology

## 2022-06-23 ENCOUNTER — Other Ambulatory Visit (HOSPITAL_COMMUNITY): Payer: Self-pay

## 2022-06-23 VITALS — BP 164/60 | HR 72 | Resp 18

## 2022-06-23 VITALS — BP 152/66 | HR 74 | Temp 98.4°F | Resp 20 | Ht 69.0 in | Wt 123.1 lb

## 2022-06-23 DIAGNOSIS — Z7189 Other specified counseling: Secondary | ICD-10-CM | POA: Diagnosis not present

## 2022-06-23 DIAGNOSIS — C20 Malignant neoplasm of rectum: Secondary | ICD-10-CM | POA: Diagnosis not present

## 2022-06-23 DIAGNOSIS — K922 Gastrointestinal hemorrhage, unspecified: Secondary | ICD-10-CM

## 2022-06-23 DIAGNOSIS — Z79899 Other long term (current) drug therapy: Secondary | ICD-10-CM | POA: Diagnosis not present

## 2022-06-23 DIAGNOSIS — Z5112 Encounter for antineoplastic immunotherapy: Secondary | ICD-10-CM | POA: Insufficient documentation

## 2022-06-23 LAB — CBC WITH DIFFERENTIAL (CANCER CENTER ONLY)
Abs Immature Granulocytes: 0.01 10*3/uL (ref 0.00–0.07)
Basophils Absolute: 0 10*3/uL (ref 0.0–0.1)
Basophils Relative: 0 %
Eosinophils Absolute: 0.2 10*3/uL (ref 0.0–0.5)
Eosinophils Relative: 4 %
HCT: 30.9 % — ABNORMAL LOW (ref 36.0–46.0)
Hemoglobin: 10.5 g/dL — ABNORMAL LOW (ref 12.0–15.0)
Immature Granulocytes: 0 %
Lymphocytes Relative: 24 %
Lymphs Abs: 1.3 10*3/uL (ref 0.7–4.0)
MCH: 31.8 pg (ref 26.0–34.0)
MCHC: 34 g/dL (ref 30.0–36.0)
MCV: 93.6 fL (ref 80.0–100.0)
Monocytes Absolute: 0.6 10*3/uL (ref 0.1–1.0)
Monocytes Relative: 12 %
Neutro Abs: 3.1 10*3/uL (ref 1.7–7.7)
Neutrophils Relative %: 60 %
Platelet Count: 220 10*3/uL (ref 150–400)
RBC: 3.3 MIL/uL — ABNORMAL LOW (ref 3.87–5.11)
RDW: 12.9 % (ref 11.5–15.5)
WBC Count: 5.2 10*3/uL (ref 4.0–10.5)
nRBC: 0 % (ref 0.0–0.2)

## 2022-06-23 LAB — CMP (CANCER CENTER ONLY)
ALT: 6 U/L (ref 0–44)
AST: 12 U/L — ABNORMAL LOW (ref 15–41)
Albumin: 3.9 g/dL (ref 3.5–5.0)
Alkaline Phosphatase: 96 U/L (ref 38–126)
Anion gap: 9 (ref 5–15)
BUN: 30 mg/dL — ABNORMAL HIGH (ref 8–23)
CO2: 26 mmol/L (ref 22–32)
Calcium: 9.3 mg/dL (ref 8.9–10.3)
Chloride: 107 mmol/L (ref 98–111)
Creatinine: 1.54 mg/dL — ABNORMAL HIGH (ref 0.44–1.00)
GFR, Estimated: 34 mL/min — ABNORMAL LOW (ref >60)
Glucose, Bld: 133 mg/dL — ABNORMAL HIGH (ref 70–99)
Potassium: 4 mmol/L (ref 3.5–5.1)
Sodium: 142 mmol/L (ref 135–145)
Total Bilirubin: 0.4 mg/dL (ref 0.3–1.2)
Total Protein: 6.8 g/dL (ref 6.5–8.1)

## 2022-06-23 LAB — IRON AND IRON BINDING CAPACITY (CC-WL,HP ONLY)
Iron: 82 ug/dL (ref 28–170)
Saturation Ratios: 31 % (ref 10.4–31.8)
TIBC: 269 ug/dL (ref 250–450)
UIBC: 187 ug/dL (ref 148–442)

## 2022-06-23 LAB — MAGNESIUM: Magnesium: 1.1 mg/dL — ABNORMAL LOW (ref 1.7–2.4)

## 2022-06-23 LAB — FERRITIN: Ferritin: 52 ng/mL (ref 11–307)

## 2022-06-23 LAB — CEA (IN HOUSE-CHCC): CEA (CHCC-In House): 1.61 ng/mL (ref 0.00–5.00)

## 2022-06-23 MED ORDER — CAPECITABINE 500 MG PO TABS
ORAL_TABLET | ORAL | 1 refills | Status: DC
  Filled 2022-06-23: qty 56, fill #0

## 2022-06-23 MED ORDER — SODIUM CHLORIDE 0.9 % IV SOLN
5.9000 mg/kg | Freq: Once | INTRAVENOUS | Status: AC
  Administered 2022-06-23: 300 mg via INTRAVENOUS
  Filled 2022-06-23: qty 15

## 2022-06-23 MED ORDER — MAGNESIUM OXIDE -MG SUPPLEMENT 400 (240 MG) MG PO TABS: 400.0000 mg | ORAL_TABLET | Freq: Every day | ORAL | 1 refills | Status: DC

## 2022-06-23 MED ORDER — MAGNESIUM SULFATE 4 GM/100ML IV SOLN
4.0000 g | Freq: Once | INTRAVENOUS | Status: AC
  Administered 2022-06-23: 4 g via INTRAVENOUS
  Filled 2022-06-23: qty 100

## 2022-06-23 MED ORDER — SODIUM CHLORIDE 0.9 % IV SOLN: Freq: Once | INTRAVENOUS | Status: AC

## 2022-06-23 NOTE — Patient Instructions (Addendum)
[facility]  Discharge Instructions: Thank you for choosing [facility] to provide your oncology and hematology care.   If you have a lab appointment with the [facility], please go directly to the [facility] and check in at the registration area.   Wear comfortable clothing and clothing appropriate for easy access to any Portacath or PICC line.   We strive to give you quality time with your provider. You may need to reschedule your appointment if you arrive late (15 or more minutes).  Arriving late affects you and other patients whose appointments are after yours.  Also, if you miss three or more appointments without notifying the office, you may be dismissed from the clinic at the provider's discretion.      For prescription refill requests, have your pharmacy contact our office and allow 72 hours for refills to be completed.    Today you received the following chemotherapy and/or immunotherapy agents: Vectibix       To help prevent nausea and vomiting after your treatment, we encourage you to take your nausea medication as directed.  BELOW ARE SYMPTOMS THAT SHOULD BE REPORTED IMMEDIATELY: *FEVER GREATER THAN 100.4 F (38 C) OR HIGHER *CHILLS OR SWEATING *NAUSEA AND VOMITING THAT IS NOT CONTROLLED WITH YOUR NAUSEA MEDICATION *UNUSUAL SHORTNESS OF BREATH *UNUSUAL BRUISING OR BLEEDING *URINARY PROBLEMS (pain or burning when urinating, or frequent urination) *BOWEL PROBLEMS (unusual diarrhea, constipation, pain near the anus) TENDERNESS IN MOUTH AND THROAT WITH OR WITHOUT PRESENCE OF ULCERS (sore throat, sores in mouth, or a toothache) UNUSUAL RASH, SWELLING OR PAIN  UNUSUAL VAGINAL DISCHARGE OR ITCHING   Items with * indicate a potential emergency and should be followed up as soon as possible or go to the Emergency Department if any problems should occur.  Please show the CHEMOTHERAPY ALERT CARD or IMMUNOTHERAPY ALERT CARD at check-in to  the Emergency Department and triage nurse.  Should you have questions after your visit or need to cancel or reschedule your appointment, please contact [facility]  Dept: [phone]  and follow the prompts.  Office hours are 8:00 a.m. to 4:30 p.m. Monday - Friday. Please note that voicemails left after 4:00 p.m. may not be returned until the following business day.  We are closed weekends and major holidays. You have access to a nurse at all times for urgent questions. Please call the main number to the clinic Dept: [phone] and follow the prompts.   For any non-urgent questions, you may also contact your provider using MyChart. We now offer e-Visits for anyone 43 and older to request care online for non-urgent symptoms. For details visit [email or web address].   Also download the MyChart app! Go to the app store, search "MyChart", open the app, select [facility], and log in with your MyChart username and password.  Masks are optional in the cancer centers. If you would like for your care team to wear a mask while they are taking care of you, please let them know. You may have one support person who is at least 79 years old accompany you for your appointments. Magnesium Sulfate Injection What is this medication? MAGNESIUM SULFATE (mag NEE zee um SUL fate) prevents and treats low levels of magnesium in your body. It may also be used to prevent and treat seizures during pregnancy in people with high blood pressure disorders, such as preeclampsia or eclampsia. Magnesium plays an important role in maintaining the health of your muscles  and nervous system. This medicine may be used for other purposes; ask your health care provider or pharmacist if you have questions. What should I tell my care team before I take this medication? They need to know if you have any of these conditions: Heart disease History of irregular heart beat Kidney disease An unusual or  allergic reaction to magnesium sulfate, medications, foods, dyes, or preservatives Pregnant or trying to get pregnant Breast-feeding How should I use this medication? This medication is for infusion into a vein. It is given in a hospital or clinic setting. Talk to your care team about the use of this medication in children. While this medication may be prescribed for selected conditions, precautions do apply. Overdosage: If you think you have taken too much of this medicine contact a poison control center or emergency room at once. NOTE: This medicine is only for you. Do not share this medicine with others. What if I miss a dose? This does not apply. What may interact with this medication? Certain medications for anxiety or sleep Certain medications for seizures, such phenobarbital Digoxin Medications that relax muscles for surgery Narcotic medications for pain This list may not describe all possible interactions. Give your health care provider a list of all the medicines, herbs, non-prescription drugs, or dietary supplements you use. Also tell them if you smoke, drink alcohol, or use illegal drugs. Some items may interact with your medicine. What should I watch for while using this medication? Your condition will be monitored carefully while you are receiving this medication. You may need blood work done while you are receiving this medication. What side effects may I notice from receiving this medication? Side effects that you should report to your care team as soon as possible: Allergic reactions--skin rash, itching, hives, swelling of the face, lips, tongue, or throat High magnesium level--confusion, drowsiness, facial flushing, redness, sweating, muscle weakness, fast or irregular heartbeat, trouble breathing Low blood pressure--dizziness, feeling faint or lightheaded, blurry vision Side effects that usually do not require medical attention (report to your care team if they continue or  are bothersome): Headache Nausea This list may not describe all possible side effects. Call your doctor for medical advice about side effects. You may report side effects to FDA at 1-800-FDA-1088. Where should I keep my medication? This medication is given in a hospital or clinic and will not be stored at home. NOTE: This sheet is a summary. It may not cover all possible information. If you have questions about this medicine, talk to your doctor, pharmacist, or health care provider.  2023 Elsevier/Gold Standard (2012-11-05 00:00:00)

## 2022-06-23 NOTE — Assessment & Plan Note (Signed)
-  Secondary to Vectibix secondary to Vectibix -Will add on IV magnesium 4 g to her infusion, and start oral magnesium once daily.

## 2022-06-23 NOTE — Progress Notes (Signed)
Ok to treat with mag 1.1 mg/dL, per Dr Burr Medico. Adding IV mag to treatment today. Pt aware and agrees to pickup additional mag from pharmacy.

## 2022-06-24 ENCOUNTER — Ambulatory Visit: Payer: Medicare HMO | Attending: Family Medicine | Admitting: Pharmacist

## 2022-06-24 DIAGNOSIS — I10 Essential (primary) hypertension: Secondary | ICD-10-CM | POA: Diagnosis not present

## 2022-06-24 MED ORDER — AMLODIPINE BESYLATE 10 MG PO TABS: 10.0000 mg | ORAL_TABLET | Freq: Every day | ORAL | 1 refills | Status: AC

## 2022-06-24 NOTE — Progress Notes (Signed)
   S:    PCP: Dr. Wynetta Emery  Patient in good spirits. Presents to the clinic for hypertension management. Patient was referred by Dr. Wynetta Emery on 05/19/2022. Her BP at that visit was 179/75 mmHg. Labetalol was increased to 200 mg BID. Of note, she was seen by Dr. Burr Medico yesterday and her BP was 152/66 mmHg at that visit.   Today, pt reports adherence with medications. She has taken all today. She brings her medications with her today. Of note, all medications look good except her amlodipine. She has an old amlodipine 5 mg bottle. She has both '10mg'$  and '5mg'$  tablets in the bottle. She tells me today that her neighbor helps her arrange her medications and will sometimes consolidate bottles. She thinks this is how this happened.   Current BP Medications include: amlodipine 10 mg, isosorbide mononitrate '60mg'$  daily, Losartan 100 mg, furosemide 40 BID, labetalol 200 mg BID  Dietary habits include: drinks 1 cup of black tea daily, denies using salt Exercise habits include: doesn't exercise Family / Social history: former smoker (quit in 2008); no alcohol use  O:  Vitals:   06/24/22 1602  BP: 135/61  Pulse: 71   Home BP readings:  none  Last 3 office BP readings: BP Readings from Last 3 Encounters:  06/24/22 135/61  06/23/22 (!) 164/60  06/23/22 (!) 152/66   BMET    Component Value Date/Time   NA 142 06/23/2022 1330   NA 142 04/19/2021 1459   K 4.0 06/23/2022 1330   CL 107 06/23/2022 1330   CO2 26 06/23/2022 1330   GLUCOSE 133 (H) 06/23/2022 1330   BUN 30 (H) 06/23/2022 1330   BUN 37 (H) 04/19/2021 1459   CREATININE 1.54 (H) 06/23/2022 1330   CREATININE 1.35 (H) 04/21/2016 1437   CALCIUM 9.3 06/23/2022 1330   GFRNONAA 34 (L) 06/23/2022 1330   GFRNONAA 39 (L) 04/21/2016 1437   GFRAA 41 (L) 07/26/2020 1503   GFRAA 45 (L) 04/21/2016 1437   Renal function: Estimated Creatinine Clearance: 26.1 mL/min (A) (by C-G formula based on SCr of 1.54 mg/dL (H)).  Clinical ASCVD: Yes  The 10-year  ASCVD risk score (Arnett DK, et al., 2019) is: 16.2%   Values used to calculate the score:     Age: 81 years     Sex: Female     Is Non-Hispanic African American: Yes     Diabetic: No     Tobacco smoker: No     Systolic Blood Pressure: 854 mmHg     Is BP treated: Yes     HDL Cholesterol: 53 mg/dL     Total Cholesterol: 166 mg/dL  A/P: Hypertension longstanding. Her pressures are consistently above goal in clinic, however, today's BP reading is very good for her! BP goal <130/80 mmHg. I have sent an updated rx for amlodipine 10 mg to her [place]. She will stop taking tablets from her current supply. I believe that some days she will take a '5mg'$  tablet and other days a 10 mg tablet unknowingly. All other medications look good and adherence reported. - Amlodipine 10 mg rx sent.  - Continue all other antihypertensives at current doses.  - Counseled on lifestyle modifications for BP control including reduced dietary sodium, increased exercise, adequate sleep.  Results reviewed and written information provided. Total time counseling 15 minutes.   F/U with PCP.   Benard Halsted, PharmD, [facility] [place], [place] [phone]

## 2022-06-25 ENCOUNTER — Telehealth: Payer: Self-pay | Admitting: Hematology

## 2022-06-25 NOTE — Telephone Encounter (Signed)
Spoke with patient confirming upcoming appointments  

## 2022-06-30 ENCOUNTER — Other Ambulatory Visit (HOSPITAL_COMMUNITY): Payer: Self-pay

## 2022-06-30 ENCOUNTER — Other Ambulatory Visit: Payer: Self-pay

## 2022-06-30 DIAGNOSIS — C20 Malignant neoplasm of rectum: Secondary | ICD-10-CM

## 2022-06-30 MED ORDER — CAPECITABINE 500 MG PO TABS
ORAL_TABLET | ORAL | 1 refills | Status: DC
  Filled 2022-06-30: qty 56, 21d supply, fill #0
  Filled 2022-07-15: qty 56, 21d supply, fill #1

## 2022-07-01 ENCOUNTER — Telehealth: Payer: Self-pay

## 2022-07-01 ENCOUNTER — Other Ambulatory Visit (HOSPITAL_COMMUNITY): Payer: Self-pay

## 2022-07-01 NOTE — Telephone Encounter (Signed)
     Patient  visit on 12/7  at [facility] you been able to follow up with your primary care physician? Yes   The patient was or was not able to obtain any needed medicine or equipment. Yes    Are there diet recommendations that you are having difficulty following?NA  Patient expresses understanding of discharge instructions and education provided has no other needs at this time.  [place], [facility], Care Management  [phone] [address], [place], [place] [address] Phone: [phone] Email: [email or web address]

## 2022-07-02 ENCOUNTER — Other Ambulatory Visit (HOSPITAL_COMMUNITY): Payer: Self-pay

## 2022-07-11 ENCOUNTER — Telehealth: Payer: Self-pay | Admitting: Hematology

## 2022-07-11 NOTE — Telephone Encounter (Signed)
Left pt voicemail regarding upcoming appointments

## 2022-07-15 ENCOUNTER — Other Ambulatory Visit (HOSPITAL_COMMUNITY): Payer: Self-pay

## 2022-07-16 ENCOUNTER — Encounter: Payer: Self-pay | Admitting: Hematology

## 2022-07-16 ENCOUNTER — Inpatient Hospital Stay: Payer: Medicare HMO

## 2022-07-16 ENCOUNTER — Telehealth: Payer: Self-pay | Admitting: Internal Medicine

## 2022-07-16 ENCOUNTER — Inpatient Hospital Stay: Payer: Medicare HMO | Admitting: Hematology

## 2022-07-16 ENCOUNTER — Other Ambulatory Visit: Payer: Self-pay

## 2022-07-16 ENCOUNTER — Emergency Department (HOSPITAL_COMMUNITY): Payer: Medicare HMO

## 2022-07-16 ENCOUNTER — Emergency Department (HOSPITAL_COMMUNITY)
Admission: EM | Admit: 2022-07-16 | Discharge: 2022-07-16 | Disposition: A | Discharge status: Home / Self Care | Payer: Medicare HMO | Attending: Emergency Medicine | Admitting: Emergency Medicine

## 2022-07-16 ENCOUNTER — Inpatient Hospital Stay: Payer: Medicare HMO | Attending: Hematology

## 2022-07-16 VITALS — BP 199/77 | HR 91 | Temp 98.5°F | Resp 20 | Wt 118.2 lb

## 2022-07-16 DIAGNOSIS — Z85048 Personal history of other malignant neoplasm of rectum, rectosigmoid junction, and anus: Secondary | ICD-10-CM | POA: Insufficient documentation

## 2022-07-16 DIAGNOSIS — I160 Hypertensive urgency: Secondary | ICD-10-CM | POA: Insufficient documentation

## 2022-07-16 DIAGNOSIS — Z8673 Personal history of transient ischemic attack (TIA), and cerebral infarction without residual deficits: Secondary | ICD-10-CM | POA: Insufficient documentation

## 2022-07-16 DIAGNOSIS — C20 Malignant neoplasm of rectum: Secondary | ICD-10-CM

## 2022-07-16 DIAGNOSIS — Z87891 Personal history of nicotine dependence: Secondary | ICD-10-CM | POA: Insufficient documentation

## 2022-07-16 DIAGNOSIS — Z7984 Long term (current) use of oral hypoglycemic drugs: Secondary | ICD-10-CM | POA: Diagnosis not present

## 2022-07-16 DIAGNOSIS — E119 Type 2 diabetes mellitus without complications: Secondary | ICD-10-CM | POA: Insufficient documentation

## 2022-07-16 DIAGNOSIS — J449 Chronic obstructive pulmonary disease, unspecified: Secondary | ICD-10-CM | POA: Insufficient documentation

## 2022-07-16 DIAGNOSIS — R0902 Hypoxemia: Secondary | ICD-10-CM

## 2022-07-16 DIAGNOSIS — K922 Gastrointestinal hemorrhage, unspecified: Secondary | ICD-10-CM

## 2022-07-16 DIAGNOSIS — I10 Essential (primary) hypertension: Secondary | ICD-10-CM | POA: Diagnosis not present

## 2022-07-16 DIAGNOSIS — Z79899 Other long term (current) drug therapy: Secondary | ICD-10-CM | POA: Insufficient documentation

## 2022-07-16 DIAGNOSIS — Z1152 Encounter for screening for COVID-19: Secondary | ICD-10-CM | POA: Insufficient documentation

## 2022-07-16 DIAGNOSIS — J439 Emphysema, unspecified: Secondary | ICD-10-CM | POA: Diagnosis not present

## 2022-07-16 DIAGNOSIS — R0602 Shortness of breath: Secondary | ICD-10-CM | POA: Diagnosis present

## 2022-07-16 DIAGNOSIS — Z5112 Encounter for antineoplastic immunotherapy: Secondary | ICD-10-CM | POA: Insufficient documentation

## 2022-07-16 DIAGNOSIS — I700 Atherosclerosis of aorta: Secondary | ICD-10-CM | POA: Diagnosis not present

## 2022-07-16 DIAGNOSIS — R0600 Dyspnea, unspecified: Secondary | ICD-10-CM | POA: Diagnosis not present

## 2022-07-16 LAB — CBC WITH DIFFERENTIAL (CANCER CENTER ONLY)
Abs Immature Granulocytes: 0.01 10*3/uL (ref 0.00–0.07)
Basophils Absolute: 0 10*3/uL (ref 0.0–0.1)
Basophils Relative: 0 %
Eosinophils Absolute: 0.3 10*3/uL (ref 0.0–0.5)
Eosinophils Relative: 5 %
HCT: 29.9 % — ABNORMAL LOW (ref 36.0–46.0)
Hemoglobin: 10.6 g/dL — ABNORMAL LOW (ref 12.0–15.0)
Immature Granulocytes: 0 %
Lymphocytes Relative: 21 %
Lymphs Abs: 1.1 10*3/uL (ref 0.7–4.0)
MCH: 33.5 pg (ref 26.0–34.0)
MCHC: 35.5 g/dL (ref 30.0–36.0)
MCV: 94.6 fL (ref 80.0–100.0)
Monocytes Absolute: 0.8 10*3/uL (ref 0.1–1.0)
Monocytes Relative: 15 %
Neutro Abs: 3 10*3/uL (ref 1.7–7.7)
Neutrophils Relative %: 59 %
Platelet Count: 192 10*3/uL (ref 150–400)
RBC: 3.16 MIL/uL — ABNORMAL LOW (ref 3.87–5.11)
RDW: 15.9 % — ABNORMAL HIGH (ref 11.5–15.5)
WBC Count: 5.1 10*3/uL (ref 4.0–10.5)
nRBC: 0 % (ref 0.0–0.2)

## 2022-07-16 LAB — CMP (CANCER CENTER ONLY)
ALT: 6 U/L (ref 0–44)
AST: 14 U/L — ABNORMAL LOW (ref 15–41)
Albumin: 4.1 g/dL (ref 3.5–5.0)
Alkaline Phosphatase: 116 U/L (ref 38–126)
Anion gap: 9 (ref 5–15)
BUN: 24 mg/dL — ABNORMAL HIGH (ref 8–23)
CO2: 28 mmol/L (ref 22–32)
Calcium: 9.9 mg/dL (ref 8.9–10.3)
Chloride: 106 mmol/L (ref 98–111)
Creatinine: 1.32 mg/dL — ABNORMAL HIGH (ref 0.44–1.00)
GFR, Estimated: 41 mL/min — ABNORMAL LOW (ref >60)
Glucose, Bld: 107 mg/dL — ABNORMAL HIGH (ref 70–99)
Potassium: 4.9 mmol/L (ref 3.5–5.1)
Sodium: 143 mmol/L (ref 135–145)
Total Bilirubin: 0.7 mg/dL (ref 0.3–1.2)
Total Protein: 6.9 g/dL (ref 6.5–8.1)

## 2022-07-16 LAB — MAGNESIUM: Magnesium: 1.3 mg/dL — ABNORMAL LOW (ref 1.7–2.4)

## 2022-07-16 LAB — IRON AND IRON BINDING CAPACITY (CC-WL,HP ONLY)
Iron: 83 ug/dL (ref 28–170)
Saturation Ratios: 26 % (ref 10.4–31.8)
TIBC: 321 ug/dL (ref 250–450)
UIBC: 238 ug/dL (ref 148–442)

## 2022-07-16 LAB — BASIC METABOLIC PANEL WITH GFR
Anion gap: 10 (ref 5–15)
BUN: 22 mg/dL (ref 8–23)
CO2: 23 mmol/L (ref 22–32)
Calcium: 9.2 mg/dL (ref 8.9–10.3)
Chloride: 107 mmol/L (ref 98–111)
Creatinine, Ser: 1.23 mg/dL — ABNORMAL HIGH (ref 0.44–1.00)
GFR, Estimated: 45 mL/min — ABNORMAL LOW
Glucose, Bld: 108 mg/dL — ABNORMAL HIGH (ref 70–99)
Potassium: 4.2 mmol/L (ref 3.5–5.1)
Sodium: 140 mmol/L (ref 135–145)

## 2022-07-16 LAB — RESP PANEL BY RT-PCR (RSV, FLU A&B, COVID)  RVPGX2
Influenza A by PCR: NEGATIVE
Influenza B by PCR: NEGATIVE
Resp Syncytial Virus by PCR: NEGATIVE
SARS Coronavirus 2 by RT PCR: NEGATIVE

## 2022-07-16 LAB — FERRITIN: Ferritin: 65 ng/mL (ref 11–307)

## 2022-07-16 LAB — BRAIN NATRIURETIC PEPTIDE: B Natriuretic Peptide: 314.9 pg/mL — ABNORMAL HIGH (ref 0.0–100.0)

## 2022-07-16 LAB — TROPONIN I (HIGH SENSITIVITY): Troponin I (High Sensitivity): 23 ng/L — ABNORMAL HIGH

## 2022-07-16 MED ORDER — SODIUM CHLORIDE 0.9 % IV SOLN: 6.0000 mg/kg | Freq: Once | INTRAVENOUS | Status: DC

## 2022-07-16 MED ORDER — NITROGLYCERIN IN D5W 200-5 MCG/ML-% IV SOLN
0.0000 ug/min | INTRAVENOUS | Status: DC
  Filled 2022-07-16: qty 250

## 2022-07-16 MED ORDER — IOHEXOL 350 MG/ML SOLN
75.0000 mL | Freq: Once | INTRAVENOUS | Status: AC | PRN
  Administered 2022-07-16: 65 mL via INTRAVENOUS

## 2022-07-16 MED ORDER — AMLODIPINE BESYLATE 5 MG PO TABS
10.0000 mg | ORAL_TABLET | Freq: Once | ORAL | Status: AC
  Administered 2022-07-16: 10 mg via ORAL
  Filled 2022-07-16: qty 2

## 2022-07-16 MED ORDER — SODIUM CHLORIDE 0.9 % IV SOLN: Freq: Once | INTRAVENOUS | Status: AC

## 2022-07-16 MED ORDER — ALBUTEROL SULFATE HFA 108 (90 BASE) MCG/ACT IN AERS
2.0000 | INHALATION_SPRAY | Freq: Once | RESPIRATORY_TRACT | Status: AC
  Administered 2022-07-16: 2 via RESPIRATORY_TRACT
  Filled 2022-07-16: qty 6.7

## 2022-07-16 MED ORDER — LABETALOL HCL 5 MG/ML IV SOLN
20.0000 mg | Freq: Once | INTRAVENOUS | Status: AC
  Administered 2022-07-16: 20 mg via INTRAVENOUS
  Filled 2022-07-16: qty 4

## 2022-07-16 MED ORDER — LOSARTAN POTASSIUM 25 MG PO TABS
100.0000 mg | ORAL_TABLET | Freq: Once | ORAL | Status: AC
  Administered 2022-07-16: 100 mg via ORAL
  Filled 2022-07-16: qty 4

## 2022-07-16 MED ORDER — ISOSORBIDE MONONITRATE ER 30 MG PO TB24
30.0000 mg | ORAL_TABLET | Freq: Every day | ORAL | Status: AC
  Administered 2022-07-16: 30 mg via ORAL
  Filled 2022-07-16: qty 1

## 2022-07-16 MED ORDER — MAGNESIUM SULFATE 4 GM/100ML IV SOLN
4.0000 g | Freq: Once | INTRAVENOUS | Status: AC
  Administered 2022-07-16: 4 g via INTRAVENOUS
  Filled 2022-07-16: qty 100

## 2022-07-16 NOTE — Discharge Instructions (Addendum)
Start wearing your oxygen 2 L all the time until you follow-up with your doctor on the 11th.  It may be important for you to see a lung doctor or get some lung test in the future to see if you need to be on inhalers to help with breathing.  Thankfully today your CAT scan was normal.  There is no sign of fluid, infection or cancer in your lungs.  Your blood counts all look good today including your kidney function and your blood counts.  You do not have COVID,flu, RSV.  Use the inhaler every 6 hours 2 puffs as needed if you feel short of breath or wheezy.  Return to the emergency room if you start feeling worse.

## 2022-07-16 NOTE — Progress Notes (Signed)
Patient transported via wc and 2LO2 with 24G in right wrist to ED room 17 per VO from Dr. Burr Medico to r/o PE. Report given to [place] RN.

## 2022-07-16 NOTE — ED Provider Notes (Signed)
[place] DEPT Provider Note  CSN: [id number] Arrival date & time: 07/16/22 1232  Chief Complaint(s) Shortness of Breath  HPI Tina Waller is a 82 y.o. female with past medical history as below, significant for rectal cancer on chemotherapy (Dr Burr Medico, stage 1 rectal cancer, On Xeloda and Vectibix), DOE, DM, anemia who presents to the ED with complaint of dib from infusion clinic. Pt was scheduled to receive chemo this AM, however upon arrival to clinic she had pulse ox 78% and conversational dyspnea on RA. Sent to ED for eval, did not receive infusion. Per pt she reports she wears 2LNC PRN in daytime and also QHS. She does report increased exertional dib over last few weeks, no cough, fevers or chills, no nausea or vomiting was reported. No change to bowel/bladder function, reports compliance with home medications although did not take her AM meds today 2/2 infusion appointment. She has some mild chest tightness but no overt pain. No recent travel or sick contacts, no hx dvt or PE  Past Medical History Past Medical History:  Diagnosis Date   Anemia    Anxiety    Cancer ([place])    rectal   Cataract    Depression    Diabetes mellitus without complication ([place])    pt denies   GERD (gastroesophageal reflux disease)    Gout    Headache    Hyperlipidemia    Hypertension    Oxygen deficiency    wears oxygen at noc   Stroke [facility]) 2008   09/14/2018   Tonsillar mass    Type 2 diabetes mellitus ([place]) 09/14/2018   Vertigo    Wears dentures    upper   Wears glasses    Patient Active Problem List   Diagnosis Date Noted   Hypoxia 07/16/2022   Hypomagnesemia 06/23/2022   Goals of care, counseling/discussion 06/22/2022   Rectal cancer ([place])    Protein-calorie malnutrition, severe 08/19/2021   GI bleeding 08/12/2021   GI bleed 08/11/2021   COVID-19 virus infection 08/11/2021   Acute renal failure superimposed on stage 3 chronic kidney disease ([place])  08/11/2021   Thrombocytopenia ([place])    Cellulitis of left lower extremity 04/20/2020   Elevated alkaline phosphatase level 07/28/2019   Microscopic hematuria 09/24/2018   Rectal bleeding 09/24/2018   Insomnia 09/24/2018   Anxiety 09/14/2018   Primary hyperparathyroidism ([place]) 02/10/2018   Vitamin B 12 deficiency 12/11/2017   Pulmonary hypertension, mild ([place]) 12/11/2017   CKD (chronic kidney disease) stage 3, GFR 30-59 ml/min ([place]) 04/17/2017   Stasis dermatitis of both legs 04/17/2017   Osteoporosis 12/22/2016   Hx of gout 12/22/2016   Estrogen deficiency 05/12/2016   Vitamin D deficiency 05/12/2016   Weight loss 05/12/2016   HTN (hypertension) 06/12/2015   History of CVA (cerebrovascular accident) 03/28/2015   Home Medication(s) Prior to Admission medications   Medication Sig Start Date End Date Taking? Authorizing Provider  acetaminophen (TYLENOL) 500 MG tablet Take 500-1,000 mg by mouth every 6 (six) hours as needed for moderate pain or headache.    [provider]  amLODipine (NORVASC) 10 MG tablet Take 1 tablet (10 mg total) by mouth daily. 06/24/22   [name], MD  ammonium lactate (LAC-HYDRIN) 12 % lotion Apply 1 Application topically as needed for dry skin. 04/30/22   Marzetta Board, DPM  capecitabine (XELODA) 500 MG tablet Take 2 tablets by mouth every 12 hours for 14 days, then off for 7 days. Repeat every 21 days.  Take within 30 minutes after meals. 06/30/22   Truitt Merle, MD  cholecalciferol (VITAMIN D3) 25 MCG (1000 UNIT) tablet Take 2 tablets (2,000 Units total) by mouth daily. Patient taking differently: Take 10,000 Units by mouth daily. 11/14/20   [name], MD  clindamycin (CLINDAGEL) 1 % gel Apply topically 2 (two) times daily as needed. 06/09/22   Heilingoetter, Cassandra L, PA-C  clopidogrel (PLAVIX) 75 MG tablet Take 1 tablet (75 mg total) by mouth daily. 04/18/22   Mansouraty, Telford Nab., MD  colchicine 0.6 MG tablet Take 1 tablet  (0.6 mg total) by mouth daily. Patient not taking: Reported on 01/07/2022 08/22/21   Little Ishikawa, MD  ferrous gluconate [facility]) 324 MG tablet Take 1 tablet (324 mg total) by mouth daily with breakfast. 08/23/21   Truitt Merle, MD  furosemide (LASIX) 40 MG tablet TAKE 1 TABLET TWICE DAILY 02/04/22   [name], MD  isosorbide mononitrate (IMDUR) 60 MG 24 hr tablet TAKE 1 AND 1/2 TABLETS EVERY DAY 05/26/22   [name], MD  labetalol (NORMODYNE) 200 MG tablet Take 1 tablet (200 mg total) by mouth 2 (two) times daily. 05/19/22   [name], MD  losartan (COZAAR) 100 MG tablet TAKE 1 TABLET EVERY DAY 03/12/22   [name], MD  magnesium oxide (MAG-OX) 400 (240 Mg) MG tablet Take 1 tablet (400 mg total) by mouth daily. 06/23/22   Truitt Merle, MD  mometasone (ELOCON) 0.1 % ointment Apply topically daily. Patient taking differently: Apply 1 Application topically daily as needed (Eczema). 07/26/20   Argentina Donovan, PA-C  Multiple Vitamin (MULTIVITAMIN WITH MINERALS) TABS tablet Take 1 tablet by mouth daily. 08/22/21   Little Ishikawa, MD  ondansetron (ZOFRAN) 4 MG tablet Take 1 tablet (4 mg total) by mouth every 8 (eight) hours as needed for nausea or vomiting. 10/09/21   Truitt Merle, MD  polyethylene glycol [facility]) packet Take 17 g by mouth daily as needed for mild constipation. 09/15/18   Hongalgi, Lenis Dickinson, MD  potassium chloride (KLOR-CON M) 10 MEQ tablet Take 1 tablet (10 mEq total) by mouth 2 (two) times daily. 11/12/21   Truitt Merle, MD  pravastatin (PRAVACHOL) 80 MG tablet TAKE 1 TABLET EVERY DAY FOR HIGH CHOLESTEROL 02/04/22   [name], MD  vitamin B-12 (CYANOCOBALAMIN) 1000 MCG tablet Take 1,000 mcg by mouth daily.    [provider]                                                                                                                                    Past Surgical History Past Surgical History:  Procedure Laterality Date   ABDOMINAL  HYSTERECTOMY     BIOPSY  08/15/2021   Procedure: BIOPSY;  Surgeon: Rush Landmark Telford Nab., MD;  Location: [facility] ENDOSCOPY;  Service: Gastroenterology;;   BIOPSY  04/17/2022   Procedure: BIOPSY;  Surgeon: Irving Copas.,  MD;  Location: [facility] ENDOSCOPY;  Service: Gastroenterology;;   BREAST SURGERY     lumpectomy   BUNIONECTOMY     CATARACT EXTRACTION Left    COLONOSCOPY     COLONOSCOPY W/ BIOPSIES AND POLYPECTOMY     COLONOSCOPY WITH PROPOFOL N/A 08/15/2021   Procedure: COLONOSCOPY WITH PROPOFOL;  Surgeon: Rush Landmark Telford Nab., MD;  Location: [place];  Service: Gastroenterology;  Laterality: N/A;   ESOPHAGOGASTRODUODENOSCOPY (EGD) WITH PROPOFOL N/A 08/15/2021   Procedure: ESOPHAGOGASTRODUODENOSCOPY (EGD) WITH PROPOFOL;  Surgeon: Rush Landmark Telford Nab., MD;  Location: [place];  Service: Gastroenterology;  Laterality: N/A;   FLEXIBLE SIGMOIDOSCOPY N/A 04/17/2022   Procedure: FLEXIBLE SIGMOIDOSCOPY;  Surgeon: Rush Landmark Telford Nab., MD;  Location: [facility] ENDOSCOPY;  Service: Gastroenterology;  Laterality: N/A;   HEMOSTASIS CLIP PLACEMENT  08/15/2021   Procedure: HEMOSTASIS CLIP PLACEMENT;  Surgeon: Irving Copas., MD;  Location: [place];  Service: Gastroenterology;;   HOT HEMOSTASIS N/A 08/15/2021   Procedure: HOT HEMOSTASIS (ARGON PLASMA COAGULATION/BICAP);  Surgeon: Irving Copas., MD;  Location: [place];  Service: Gastroenterology;  Laterality: N/A;   MULTIPLE TOOTH EXTRACTIONS     POLYPECTOMY  08/15/2021   Procedure: POLYPECTOMY;  Surgeon: Mansouraty, Telford Nab., MD;  Location: [place];  Service: Gastroenterology;;   SUBMUCOSAL TATTOO INJECTION  08/15/2021   Procedure: SUBMUCOSAL TATTOO INJECTION;  Surgeon: Irving Copas., MD;  Location: [place];  Service: Gastroenterology;;   TONGUE BIOPSY Right 12/13/2018   Procedure: RIGHT TONSIL BIOPSY;  Surgeon: Melida Quitter, MD;  Location: [facility] OR;  Service: ENT;  Laterality: Right;   UPPER  GASTROINTESTINAL ENDOSCOPY     Family History Family History  Problem Relation Age of Onset   Heart disease Mother        CHF   Osteoporosis Neg Hx    Colon cancer Neg Hx    Stomach cancer Neg Hx    Pancreatic cancer Neg Hx    Esophageal cancer Neg Hx    Rectal cancer Neg Hx     Social History Social History   Tobacco Use   Smoking status: Former    Types: Cigarettes    Quit date: 07/14/2006    Years since quitting: 16.0   Smokeless tobacco: Never  Vaping Use   Vaping Use: Never used  Substance Use Topics   Alcohol use: Not Currently    Alcohol/week: 4.0 standard drinks of alcohol    Types: 4 Shots of liquor per week    Comment: none since Jan   Drug use: No   Allergies Lisinopril, Carvedilol, Hydralazine, and Lipitor [atorvastatin]  Review of Systems Review of Systems  Constitutional:  Positive for fatigue. Negative for activity change and fever.  HENT:  Negative for facial swelling and trouble swallowing.   Eyes:  Negative for discharge and redness.  Respiratory:  Positive for cough, chest tightness and shortness of breath.   Cardiovascular:  Negative for chest pain and palpitations.  Gastrointestinal:  Negative for abdominal pain and nausea.  Genitourinary:  Negative for dysuria and flank pain.  Musculoskeletal:  Negative for back pain and gait problem.  Skin:  Negative for pallor and rash.  Neurological:  Negative for syncope and headaches.    Physical Exam Vital Signs  I have reviewed the triage vital signs BP (!) 216/83 (BP Location: Right Arm)   Pulse 80   Temp 98.6 F (37 C) (Oral)   Resp 18   SpO2 97%  Physical Exam Vitals and nursing note reviewed.  Constitutional:  General: She is not in acute distress.    Appearance: Normal appearance. She is well-developed. She is not ill-appearing, toxic-appearing or diaphoretic.  HENT:     Head: Normocephalic and atraumatic.     Right Ear: External ear normal.     Left Ear: External ear normal.      Nose: Nose normal.     Mouth/Throat:     Mouth: Mucous membranes are moist.  Eyes:     General: No scleral icterus.       Right eye: No discharge.        Left eye: No discharge.  Cardiovascular:     Rate and Rhythm: Normal rate and regular rhythm.     Pulses: Normal pulses.     Heart sounds: Normal heart sounds.  Pulmonary:     Effort: Pulmonary effort is normal. No respiratory distress.     Breath sounds: Decreased breath sounds present.  Abdominal:     General: Abdomen is flat.     Tenderness: There is no abdominal tenderness.  Musculoskeletal:        General: Normal range of motion.     Cervical back: Normal range of motion. No rigidity.     Right lower leg: No edema.     Left lower leg: No edema.  Skin:    General: Skin is warm and dry.     Capillary Refill: Capillary refill takes less than 2 seconds.  Neurological:     Mental Status: She is alert and oriented to person, place, and time.     GCS: GCS eye subscore is 4. GCS verbal subscore is 5. GCS motor subscore is 6.  Psychiatric:        Mood and Affect: Mood normal.        Behavior: Behavior normal.     ED Results and Treatments Labs (all labs ordered are listed, but only abnormal results are displayed) Labs Reviewed  RESP PANEL BY RT-PCR (RSV, FLU A&B, COVID)  [id number]  BASIC METABOLIC PANEL  BRAIN NATRIURETIC PEPTIDE  BRAIN NATRIURETIC PEPTIDE  TROPONIN I (HIGH SENSITIVITY)  TROPONIN I (HIGH SENSITIVITY)                                                                                                                          Radiology DG Chest 2 View  Result Date: 07/16/2022 CLINICAL DATA:  Dyspnea. Decreased oxygen saturations on room air. On chemotherapy. EXAM: CHEST - 2 VIEW COMPARISON:  AP chest 08/20/2021, 08/13/2021, 08/11/2021; CT chest 05/26/2022 FINDINGS: Cardiac silhouette is again mildly enlarged. Moderate calcifications are again seen within the aortic arch. There is mildly increased bilateral diffuse  interstitial thickening compared to most recent 08/20/2021 radiographs. No definite interstitial thickening or pulmonary edema was seen on 05/26/2022 CT. There are flattening of the diaphragms, hyperinflation, and chronic centrilobular emphysematous lucencies again seen within the lungs as on prior CT. Possible minimal posterior greater than right and left frontal costophrenic angle blunting, possible tiny pleural effusions.  No pneumothorax. Moderate multilevel degenerative disc changes of the thoracic spine. IMPRESSION: 1. Mildly increased bilateral diffuse interstitial thickening compared to most recent 08/20/2021 radiographs. No definite interstitial thickening or pulmonary edema was seen on 05/26/2022 CT. This could represent mild interstitial pulmonary edema or atypical infection. 2. Tiny bilateral pleural effusions versus scarring. 3. Chronic emphysematous changes. Electronically Signed   By: Yvonne Kendall M.D.   On: 07/16/2022 13:28    Pertinent labs & imaging results that were available during my care of the patient were reviewed by me and considered in my medical decision making (see MDM for details).  Medications Ordered in ED Medications  labetalol (NORMODYNE) injection 20 mg (has no administration in time range)  amLODipine (NORVASC) tablet 10 mg (10 mg Oral Given 07/16/22 1355)  isosorbide mononitrate (IMDUR) 24 hr tablet 30 mg (30 mg Oral Given 07/16/22 1357)  losartan (COZAAR) tablet 100 mg (100 mg Oral Given 07/16/22 1356)                                                                                                                                     Procedures .Critical Care  Performed by: Jeanell Sparrow, DO Authorized by: Jeanell Sparrow, DO   Critical care provider statement:    Critical care time (minutes):  30   Critical care time was exclusive of:  Separately billable procedures and treating other patients   Critical care was necessary to treat or prevent imminent or  life-threatening deterioration of the following conditions:  Respiratory failure and cardiac failure   Critical care was time spent personally by me on the following activities:  Development of treatment plan with patient or surrogate, discussions with consultants, evaluation of patient's response to treatment, examination of patient, ordering and review of laboratory studies, ordering and review of radiographic studies, ordering and performing treatments and interventions, pulse oximetry, re-evaluation of patient's condition, review of old charts and obtaining history from patient or surrogate   (including critical care time)  Medical Decision Making / ED Course   MDM:  Tina Waller is a 82 y.o. female with past medical history as below, significant for rectal cancer on chemotherapy (Dr Burr Medico, stage 1 rectal cancer, On Xeloda and Vectibix), DOE, DM, anemia who presents to the ED with complaint of dib from infusion clinic.. The complaint involves an extensive differential diagnosis and also carries with it a high risk of complications and morbidity.  Serious etiology was considered. Ddx includes but is not limited to: In my evaluation of this patient's dyspnea my DDx includes, but is not limited to, pneumonia, pulmonary embolism, pneumothorax, pulmonary edema, metabolic acidosis, asthma, COPD, cardiac cause, anemia, anxiety, etc.   On initial assessment the patient is: resting comfortably on ambient air, not hypoxic, bp is elevated but did not take her AM anti-hypertensive. Will order her home meds and broad w/u for etiology of her DIB. She was recommended for  CTPE by oncology, this is reasonable given hx malignancy and dib.  Vital signs and nursing notes were reviewed    Pt with elev bp, chest tightness/pressure. She was given her home BP medications and IV labetalol with marginal improvement to her blood pressure. CXR with concern of mild interstitial pulm edema per radiology. Pt w/  exertional dyspnea with very minimal exertion but at rest she is not dyspneic. Attempted to ambulate pt to restroom and her o2 sat dropped to 75% w/ tachypnea + conversational dyspnea. Concern for hypertensive urgency given ?pulm edema, hypoxia and hypertension (looks like on prior visits her bp ranged from 357'S-177'L systolic). EKG similar to prior w/o acute ischemic changes. Will start NTG gtt. CT PE was ordered and is pending at shift change, majority of her labs are also pending at shift change. Patient to likely require admission given hypoxia and concern for hypertensive urgency vs emergency. She is on [facility] currently. Signed out to incoming EDP pending CT imaging and remainder of lab results.    Additional history obtained: -Additional history obtained from na -External records from outside source obtained and reviewed including: Chart review including previous notes, labs, imaging, consultation notes including prior labs/imaging/home meds/primary care documentation   Lab Tests: -I ordered, reviewed, and interpreted labs.   The pertinent results include:   Labs Reviewed  RESP PANEL BY RT-PCR (RSV, FLU A&B, COVID)  [id number]  BASIC METABOLIC PANEL  BRAIN NATRIURETIC PEPTIDE  BRAIN NATRIURETIC PEPTIDE  TROPONIN I (HIGH SENSITIVITY)  TROPONIN I (HIGH SENSITIVITY)    Notable for labs pending  EKG   EKG Interpretation  Date/Time:  Wednesday July 16 2022 12:42:11 EST Ventricular Rate:  81 PR Interval:  171 QRS Duration: 113 QT Interval:  390 QTC Calculation: 453 R Axis:   -1 Text Interpretation: Sinus rhythm Ventricular premature complex Left ventricular hypertrophy Anterior Q waves, possibly due to LVH similar to prior PVC noted no stemi Confirmed by Wynona Dove ([id number]) on 07/16/2022 2:39:58 PM         Imaging Studies ordered: I ordered imaging studies including CXR, CTPE I independently visualized the following imaging with scope of interpretation limited to determining acute  life threatening conditions related to emergency care: CXR, CTPE, which revealed CXR with ?edema vs atypical infection. CTPE pending at shift change.  I independently visualized and interpreted imaging. I agree with the radiologist interpretation   Medicines ordered and prescription drug management: Meds ordered this encounter  Medications   amLODipine (NORVASC) tablet 10 mg   isosorbide mononitrate (IMDUR) 24 hr tablet 30 mg   losartan (COZAAR) tablet 100 mg   labetalol (NORMODYNE) injection 20 mg    -I have reviewed the patients home medicines and have made adjustments as needed   Consultations Obtained: I requested consultation with the na,  and discussed lab and imaging findings as well as pertinent plan - they recommend: na   Cardiac Monitoring: The patient was maintained on a cardiac monitor.  I personally viewed and interpreted the cardiac monitored which showed an underlying rhythm of: NSR  Social Determinants of Health:  Diagnosis or treatment significantly limited by social determinants of health: na   Reevaluation: After the interventions noted above, I reevaluated the patient and found that they have stayed the same  Co morbidities that complicate the patient evaluation  Past Medical History:  Diagnosis Date   Anemia    Anxiety    Cancer ([place])    rectal   Cataract    Depression  Diabetes mellitus without complication ([place])    pt denies   GERD (gastroesophageal reflux disease)    Gout    Headache    Hyperlipidemia    Hypertension    Oxygen deficiency    wears oxygen at noc   Stroke [facility]) 2008   09/14/2018   Tonsillar mass    Type 2 diabetes mellitus ([place]) 09/14/2018   Vertigo    Wears dentures    upper   Wears glasses       Dispostion: Disposition decision including need for hospitalization was considered, and patient dispo pending at shift change     Final Clinical Impression(s) / ED Diagnoses Final diagnoses:  Hypertensive urgency   Hypoxia     This chart was dictated using voice recognition software.  Despite best efforts to proofread,  errors can occur which can change the documentation meaning.    Jeanell Sparrow, DO 07/16/22 1616

## 2022-07-16 NOTE — Progress Notes (Signed)
[place]   Telephone:([phone] Fax:([phone]) [phone]   Clinic Follow up Note   Patient Care Team: [name], MD as PCP - General (Internal Medicine) Truitt Merle, MD as Consulting Physician (Hematology)  Date of Service:  07/16/2022  CHIEF COMPLAINT: SOB   CURRENT THERAPY:  Oral chemo Xeloda day 1-14 every 21 days IV Vectibix every 2 weeks  ASSESSMENT:  Tina Waller is a 82 y.o. female with   Rectal cancer ([place]) stage I c(T2, N0)M0, MSS, KRAS/NRAS/BRAF wild type  -diagnosed 08/2021 by colonoscopy for black stool. -pt declined surgery  --s/p concurrent chemoradiation with Xeloda 10/21/21 - 11/28/21. Tolerated well overall.  -due to residual disease after chemoRT, she started Xeloda and Vectibix on 06/09/2022 -Due to her shortness of breath and hypoxia, will hold treatment today.  Hypoxia -Patient reports dyspnea on exertion for the past 1 to 2 weeks, no cough, chest pain, or other respiratory symptoms. -She was found to be hypoxic with pulse ox 78% on walking in our infusion room -Will send her to emergency room for further evaluation, patient likely need a CTA to rule out a PE.    PLAN: -Due to his significant hypoxia, will send her to [facility] emergency room for further evaluation -Will hold treatment today. -I will f/u her in hospital    SUMMARY OF ONCOLOGIC HISTORY: Oncology History  Rectal cancer ([place])  08/15/2021 Cancer Staging   Staging form: Colon and Rectum, AJCC 8th Edition - Clinical stage from 08/15/2021: Stage I (cT2, cN0, cM0) - Signed by Truitt Merle, MD on 09/05/2021 Stage prefix: Initial diagnosis Total positive nodes: 0   08/15/2021 Procedure   Colonoscopy, Dr. Rush Landmark  Impression: - Palpable rectal mass found on digital rectal exam. - The examined portion of the ileum was normal. - Four 3 to 7 mm polyps at the recto-sigmoid colon, in the descending colon and in the ascending colon, removed with a cold snare. Resected and  retrieved. - Rule out malignancy, partially obstructing tumor in the proximal/middle rectum. Biopsied. Tattooed proximal to lesion. - Diverticulosis in the entire examined colon. - Normal mucosa in the entire examined colon otherwise. - Non-bleeding non-thrombosed external and internal hemorrhoids.   08/15/2021 Initial Biopsy   FINAL MICROSCOPIC DIAGNOSIS:   A. STOMACH, BIOPSY:  - Gastric antral and oxyntic mucosa with nonspecific reactive  gastropathy  - Helicobacter pylori-like organisms are not identified on routine HE stain   B. DUODENUM, BIOPSY:  - Duodenal mucosa with no specific histopathologic changes  - Negative for increased intraepithelial lymphocytes or villous  architectural changes   C. COLON, ASCENDING, DESCENDING AND SIGMOID, POLYPECTOMY:  - Tubular adenoma(s)  - Negative for high-grade dysplasia or malignancy   D. RECTAL MASS, BIOPSY:  - Fragments of an adenoma with multifocal high-grade dysplasia, focally suspicious for intramucosal carcinoma.  See comment    COMMENT:  D.  A deeper, more severe process cannot be ruled out.  Clinical and endoscopic correlation is suggested.    08/16/2021 Imaging   EXAM: CT CHEST, ABDOMEN, AND PELVIS WITH CONTRAST  IMPRESSION: 1. Large rectal mass. Two tiny hypodense lesions in the liver are nonspecific with regard to benign etiology or metastatic disease. 2. 9 mm in long axis hypodense pancreatic body lesion. Possibilities include postinflammatory cyst, intraductal papillary mucinous neoplasm, or less likely pancreatic adenocarcinoma. 3. Bilateral renal cysts. A complex right kidney upper pole lesion is indeterminate for complex cyst versus mass and measures 9 mm in diameter. 4. In light of these findings,  consider upper abdominal MRI with contrast to further characterize the hepatic, pancreatic, and renal lesions. 5. There is also unusual left perirenal stranding especially adjacent to a cyst along the left kidney lower pole,  this could be an indicator of inflammation involving the left kidney but is technically nonspecific. Some of this edema tracks along the left paracolic gutter. 6. Advanced systemic atherosclerosis. This includes aortic and coronary atherosclerosis along with mesenteric and renal atherosclerosis. If further workup of the patient's severe atherosclerotic vascular disease is warranted, CT angiography would be recommended. 7. Small bilateral pleural effusions with passive atelectasis. 8. Severe centrilobular emphysema.  Emphysema (ICD10-J43.9). 9. Mild thoracolumbar scoliosis. 10. Scattered sigmoid colon diverticula.   08/18/2021 Imaging   EXAM: MRI ABDOMEN WITHOUT AND WITH CONTRAST (INCLUDING MRCP)  IMPRESSION: 1. Study is severely limited by beam hardening artifact from a large surgical clip in the stomach, which completely obscures the previously noted pancreatic lesion on most pulse sequences rendering today's study nondiagnostic. The only visualized hepatic lesion is compatible with a small simple hepatic cyst. 2. There is abnormal signal intensity and small rim enhancing lesions in the right psoas muscle. Clinical correlation for signs and symptoms of psoas abscess is recommended. Alternatively, the possibility of intramuscular metastasis could be considered. 3. Small bilateral pleural effusions lying dependently with areas of presumed passive atelectasis in the lower lobes of the lungs bilaterally. 4. Multiple simple cysts in the kidneys bilaterally. 5. Aortic atherosclerosis.   08/20/2021 Initial Diagnosis   Rectal cancer ([place])   08/21/2021 Imaging   EXAM: MRI PELVIS WITHOUT CONTRAST  IMPRESSION: Rectal adenocarcinoma T stage: T2   Rectal adenocarcinoma N stage:  N0   Distance from tumor to the internal anal sphincter is 4.5 cm.   06/09/2022 -  Chemotherapy   Patient is on Treatment Plan : COLORECTAL Panitumumab q14d ([facility] [place] - Type Gene Only)        INTERVAL  HISTORY:  Tina Waller is here for a follow up of rectal cancer. She was last seen by me 3 weeks ago.  She presents to the clinic accompanied by her goddaughter.  I saw her in the infusion room.  She reports new onset dyspnea on minimal exertion for the past 1 to 2 weeks, she had to use wheelchair to come in today.  She denies any productive cough, chest pain, fever, chills, or other new respiratory symptoms.  She finished her last cycle Xeloda last week, and is off this week.  She has been feeling overall quite fatigued, appetite is normal, no diarrhea or abdominal symptoms.   All other systems were reviewed with the patient and are negative.  MEDICAL HISTORY:  Past Medical History:  Diagnosis Date   Anemia    Anxiety    Cancer ([place])    rectal   Cataract    Depression    Diabetes mellitus without complication ([place])    pt denies   GERD (gastroesophageal reflux disease)    Gout    Headache    Hyperlipidemia    Hypertension    Oxygen deficiency    wears oxygen at noc   Stroke [facility]) 2008   09/14/2018   Tonsillar mass    Type 2 diabetes mellitus ([place]) 09/14/2018   Vertigo    Wears dentures    upper   Wears glasses     SURGICAL HISTORY: Past Surgical History:  Procedure Laterality Date   ABDOMINAL HYSTERECTOMY     BIOPSY  08/15/2021   Procedure: BIOPSY;  Surgeon:  Mansouraty, Telford Nab., MD;  Location: [facility];  Service: Gastroenterology;;   BIOPSY  04/17/2022   Procedure: BIOPSY;  Surgeon: Irving Copas., MD;  Location: [facility];  Service: Gastroenterology;;   BREAST SURGERY     lumpectomy   BUNIONECTOMY     CATARACT EXTRACTION Left    COLONOSCOPY     COLONOSCOPY W/ BIOPSIES AND POLYPECTOMY     COLONOSCOPY WITH PROPOFOL N/A 08/15/2021   Procedure: COLONOSCOPY WITH PROPOFOL;  Surgeon: Irving Copas., MD;  Location: [place];  Service: Gastroenterology;  Laterality: N/A;   ESOPHAGOGASTRODUODENOSCOPY (EGD) WITH PROPOFOL N/A 08/15/2021    Procedure: ESOPHAGOGASTRODUODENOSCOPY (EGD) WITH PROPOFOL;  Surgeon: Rush Landmark Telford Nab., MD;  Location: [place];  Service: Gastroenterology;  Laterality: N/A;   FLEXIBLE SIGMOIDOSCOPY N/A 04/17/2022   Procedure: FLEXIBLE SIGMOIDOSCOPY;  Surgeon: Rush Landmark Telford Nab., MD;  Location: [facility];  Service: Gastroenterology;  Laterality: N/A;   HEMOSTASIS CLIP PLACEMENT  08/15/2021   Procedure: HEMOSTASIS CLIP PLACEMENT;  Surgeon: Irving Copas., MD;  Location: [place];  Service: Gastroenterology;;   HOT HEMOSTASIS N/A 08/15/2021   Procedure: HOT HEMOSTASIS (ARGON PLASMA COAGULATION/BICAP);  Surgeon: Irving Copas., MD;  Location: [place];  Service: Gastroenterology;  Laterality: N/A;   MULTIPLE TOOTH EXTRACTIONS     POLYPECTOMY  08/15/2021   Procedure: POLYPECTOMY;  Surgeon: Mansouraty, Telford Nab., MD;  Location: [place];  Service: Gastroenterology;;   SUBMUCOSAL TATTOO INJECTION  08/15/2021   Procedure: SUBMUCOSAL TATTOO INJECTION;  Surgeon: Irving Copas., MD;  Location: [place];  Service: Gastroenterology;;   TONGUE BIOPSY Right 12/13/2018   Procedure: RIGHT TONSIL BIOPSY;  Surgeon: Melida Quitter, MD;  Location: [place];  Service: ENT;  Laterality: Right;   UPPER GASTROINTESTINAL ENDOSCOPY      I have reviewed the social history and family history with the patient and they are unchanged from previous note.  ALLERGIES:  is allergic to lisinopril, carvedilol, hydralazine, and lipitor [atorvastatin].  MEDICATIONS:  Current Outpatient Medications  Medication Sig Dispense Refill   acetaminophen (TYLENOL) 500 MG tablet Take 500-1,000 mg by mouth every 6 (six) hours as needed for moderate pain or headache.     amLODipine (NORVASC) 10 MG tablet Take 1 tablet (10 mg total) by mouth daily. 90 tablet 1   ammonium lactate (LAC-HYDRIN) 12 % lotion Apply 1 Application topically as needed for dry skin. 400 g 5   capecitabine (XELODA) 500 MG  tablet Take 2 tablets by mouth every 12 hours for 14 days, then off for 7 days. Repeat every 21 days. Take within 30 minutes after meals. 56 tablet 1   cholecalciferol (VITAMIN D3) 25 MCG (1000 UNIT) tablet Take 2 tablets (2,000 Units total) by mouth daily. (Patient taking differently: Take 10,000 Units by mouth daily.) 100 tablet 1   clindamycin (CLINDAGEL) 1 % gel Apply topically 2 (two) times daily as needed. 30 g 0   clopidogrel (PLAVIX) 75 MG tablet Take 1 tablet (75 mg total) by mouth daily. 90 tablet 1   colchicine 0.6 MG tablet Take 1 tablet (0.6 mg total) by mouth daily. (Patient not taking: Reported on 01/07/2022) 14 tablet 0   ferrous gluconate (FERGON) 324 MG tablet Take 1 tablet (324 mg total) by mouth daily with breakfast. 30 tablet 0   furosemide (LASIX) 40 MG tablet TAKE 1 TABLET TWICE DAILY 180 tablet 1   isosorbide mononitrate (IMDUR) 60 MG 24 hr tablet TAKE 1 AND 1/2 TABLETS EVERY DAY 135 tablet 3   labetalol (NORMODYNE) 200  MG tablet Take 1 tablet (200 mg total) by mouth 2 (two) times daily. 180 tablet 2   losartan (COZAAR) 100 MG tablet TAKE 1 TABLET EVERY DAY 90 tablet 0   magnesium oxide (MAG-OX) 400 (240 Mg) MG tablet Take 1 tablet (400 mg total) by mouth daily. 30 tablet 1   mometasone (ELOCON) 0.1 % ointment Apply topically daily. (Patient taking differently: Apply 1 Application topically daily as needed (Eczema).) 45 g 0   Multiple Vitamin (MULTIVITAMIN WITH MINERALS) TABS tablet Take 1 tablet by mouth daily. 30 tablet 0   ondansetron (ZOFRAN) 4 MG tablet Take 1 tablet (4 mg total) by mouth every 8 (eight) hours as needed for nausea or vomiting. 20 tablet 0   polyethylene glycol (MIRALAX) packet Take 17 g by mouth daily as needed for mild constipation.     potassium chloride (KLOR-CON M) 10 MEQ tablet Take 1 tablet (10 mEq total) by mouth 2 (two) times daily. 14 tablet 0   pravastatin (PRAVACHOL) 80 MG tablet TAKE 1 TABLET EVERY DAY FOR HIGH CHOLESTEROL 90 tablet 1    vitamin B-12 (CYANOCOBALAMIN) 1000 MCG tablet Take 1,000 mcg by mouth daily.     No current facility-administered medications for this visit.   Facility-Administered Medications Ordered in Other Visits  Medication Dose Route Frequency Provider Last Rate Last Admin   0.9 %  sodium chloride infusion   Intravenous Once Truitt Merle, MD 500 mL/hr at 07/16/22 1123 New Bag at 07/16/22 1123   magnesium sulfate IVPB 4 g 100 mL  4 g Intravenous Once Truitt Merle, MD 50 mL/hr at 07/16/22 1143 4 g at 07/16/22 1143    PHYSICAL EXAMINATION: ECOG PERFORMANCE STATUS: 3 - Symptomatic, >50% confined to bed  There were no vitals filed for this visit. Wt Readings from Last 3 Encounters:  07/16/22 118 lb 4 oz (53.6 kg)  06/23/22 123 lb 1.6 oz (55.8 kg)  06/09/22 122 lb 6.4 oz (55.5 kg)     GENERAL:alert, no distress and comfortable SKIN: skin color, texture, turgor are normal, no rashes or significant lesions EYES: normal, Conjunctiva are pink and non-injected, sclera clear NECK: supple, thyroid normal size, non-tender, without nodularity LYMPH:  no palpable lymphadenopathy in the cervical, axillary  LUNGS: clear to auscultation and percussion with normal breathing effort HEART: regular rate & rhythm and no murmurs and no lower extremity edema ABDOMEN:abdomen soft, non-tender and normal bowel sounds Musculoskeletal:no cyanosis of digits and no clubbing  NEURO: alert & oriented x 3 with fluent speech, no focal motor/sensory deficits  LABORATORY DATA:  I have reviewed the data as listed    Latest Ref Rng & Units 07/16/2022    9:49 AM 06/23/2022    1:30 PM 06/18/2022   10:32 PM  CBC  WBC 4.0 - 10.5 K/uL 5.1  5.2  3.8   Hemoglobin 12.0 - 15.0 g/dL 10.6  10.5  11.0   Hematocrit 36.0 - 46.0 % 29.9  30.9  33.1   Platelets 150 - 400 K/uL 192  220  216         Latest Ref Rng & Units 07/16/2022    9:49 AM 06/23/2022    1:30 PM 06/18/2022   10:32 PM  CMP  Glucose 70 - 99 mg/dL 107  133  123   BUN 8 - 23  mg/dL _0 Creatinine 0.44 - 1.00 mg/dL 1.32  1.54  1.60   Sodium 135 - 145 mmol/L 143  142  142  Potassium 3.5 - 5.1 mmol/L 4.9  4.0  3.2   Chloride 98 - 111 mmol/L 106  107  109   CO2 22 - 32 mmol/L _0 Calcium 8.9 - 10.3 mg/dL 9.9  9.3  8.9   Total Protein 6.5 - 8.1 g/dL 6.9  6.8    Total Bilirubin 0.3 - 1.2 mg/dL 0.7  0.4    Alkaline Phos 38 - 126 U/L 116  96    AST 15 - 41 U/L 14  12    ALT 0 - 44 U/L 6  6        RADIOGRAPHIC STUDIES: I have personally reviewed the radiological images as listed and agreed with the findings in the report. No results found.    No orders of the defined types were placed in this encounter.  All questions were answered. The patient knows to call the clinic with any problems, questions or concerns. No barriers to learning was detected. The total time spent in the appointment was 30 minutes.     Truitt Merle, MD 07/16/2022

## 2022-07-16 NOTE — Assessment & Plan Note (Signed)
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 -pt declined surgery  --s/p concurrent chemoradiation with Xeloda 10/21/21 - 11/28/21. Tolerated well overall.  -due to residual disease after chemoRT, she started Xeloda and Vectibix on 06/09/2022 -Due to her shortness of breath and hypoxia, will hold treatment today.

## 2022-07-16 NOTE — ED Provider Notes (Signed)
I assumed care from Dr. Pearline Cables at 3 PM.  Patient is here due to hypoxia with ambulating.  At rest patient denies feeling short of breath.  She does not appear fluid overloaded today.  Patient's labs without significant anemia, AKI and patient denies any symptoms of infection.  Patient does have oxygen at home and reports for a while she was using it at night but she has not really been using it at all because when she was checking her oxygen level looked okay.  In the last 2 weeks she has noticed a decline in her activity levels and has felt short of breath with activity.  She has a negative CTA today for PE, fluid or infection.  She does have extensive scarring for emphysema.  She was given an inhaler here and encouraged to follow-up with her doctor as she may need PFTs in the future.  Also patient was noted to be hypertensive here but had not taken her morning medications.  She was given her medications here and pressure is now 173/63.  This is not far off of her baseline.  This entire last 12 months at every visit she has had her blood pressure has been in this range and at home she runs between 384-536 systolic.  All the findings were discussed with the patient and she feels comfortable going home as she has plenty of oxygen at home.  She has a follow-up with her doctor on the 11th and at this time she was given return precautions.   Blanchie Dessert, MD 07/16/22 [phone]

## 2022-07-16 NOTE — ED Notes (Signed)
RN called to cancer center to set up transportation for patient I s/w Tina Waller who is sending message to transport company to have them call RN back.

## 2022-07-16 NOTE — Progress Notes (Signed)
Okay to treat with Mag 1.3 per Dr. Burr Medico. Patient c/o of increased SOB over the course  of a week and she has to rest with any activity. Dr. Burr Medico made aware. Ambulated patient and O2 sats of 78% to low 80's. Dr. Burr Medico made aware and order received to transport patient to ED. John CMA contacted ED charge and received room 17 in 30 minutes. Patient made aware.

## 2022-07-16 NOTE — Progress Notes (Signed)
Gave report to [facility] ED charge nurse, patient is stating at 61 need to rule out PE, Charge assigned rm 17 for patient in 30 min. Infusion rm Leanne Chang will take patient to ED.

## 2022-07-16 NOTE — Assessment & Plan Note (Signed)
-  Patient reports dyspnea on exertion for the past 1 to 2 weeks, no cough, chest pain, or other respiratory symptoms. -She was found to be hypoxic with pulse ox 78% on walking in our infusion room -Will send her to emergency room for further evaluation, patient likely need a CTA to rule out a PE.

## 2022-07-16 NOTE — Progress Notes (Signed)
Opened in error. See full note from Dr. Burr Medico.

## 2022-07-16 NOTE — ED Triage Notes (Signed)
Patient brought in from cancer center. She did not receive treatment today but takes oral and IV chemo. While ambulating patient O2 dropped to 78% on RA MD had patient sent to ED to rule out PE.

## 2022-07-17 ENCOUNTER — Other Ambulatory Visit (HOSPITAL_COMMUNITY): Payer: Self-pay

## 2022-07-22 ENCOUNTER — Telehealth: Payer: Self-pay

## 2022-07-22 NOTE — Patient Outreach (Signed)
  Care Coordination TOC Note Transition Care Management Unsuccessful Follow-up Telephone Call  Date of discharge and from where:  07/16/22-[facility]  Attempts:  1st Attempt  Reason for unsuccessful TCM follow-up call:  Left voice message    Enzo Montgomery, RN,BSN,CCM [place] Management Telephonic Care Management Coordinator Direct Phone: [phone] Toll Free: [phone] Fax: [phone]

## 2022-07-23 ENCOUNTER — Telehealth: Payer: Self-pay

## 2022-07-23 ENCOUNTER — Ambulatory Visit: Payer: Self-pay | Admitting: *Deleted

## 2022-07-23 NOTE — Telephone Encounter (Signed)
Spoke with patient  o2 sat on 2L is 85%. Patient does not want to got to ED. Advised patient to increase o2 to 3 L and to try to keep o2 sat at 90%. Advised patient she if needed she may increased to 4L. Appointment with Dr .Wynetta Emery Re: visit follow-up. Advised that if o2 sat remained below 90% call 911 or return to ED.

## 2022-07-23 NOTE — Patient Outreach (Addendum)
  Care Coordination [facility] Note Transition Care Management Follow-up Telephone Call Date of discharge and from where: 07/16/22- [facility]  Dx: Scarlett Presto on EMMI-ED Discharge Alert Reason: "Lost interest in things they used to enjoy? Yes" "Sad, hopeless, anxious or empty? Yes" Red Alert Date: 07/21/22 How have you been since you were released from the hospital? Patient reports "thing going a little slow" as she shares that she still does not have "much energy to do much." She believes this is attributed to her currently undergoing CA txs. Also, voices "loss of taste buds.". She is drinking nutritional supplements. Patient reports she is using oxygen 24/7. She does become short winded with exertion. She voices she was given Albuterol inhaler in ED to start using but has not used it yet. Educated patient on inhaler and encouraged to begin using. She voiced understanding. Discussed red alert. Patient states the day she received automated call it was during the anniversary of a death of loved one so she was feeling a little down." She does states she "does not feel that way this week and at present." Any questions or concerns? No  Items Reviewed: Did the pt receive and understand the discharge instructions provided? Yes  Medications obtained and verified? Yes  Other? Yes -resp mgmt, BP mgmt Any new allergies since your discharge? No  Dietary orders reviewed? Yes Do you have support at home? Yes   Home Care and Equipment/Supplies: Were home health services ordered? not applicable If so, what is the name of the agency? N/A  Has the agency set up a time to come to the patient's home? not applicable Were any new equipment or medical supplies ordered?  No What is the name of the medical supply agency? N/A Were you able to get the supplies/equipment? not applicable Do you have any questions related to the use of the equipment or supplies? No  Functional Questionnaire: (I = Independent and D =  Dependent) ADLs: I  Bathing/Dressing- I  Meal Prep- I  Eating- I  Maintaining continence- I  Transferring/Ambulation- I  Managing Meds- I  Follow up appointments reviewed:  PCP Hospital f/u appt confirmed? Patient has [facility] appt on 08/01/22. She will call to see if appt can be changed to follow up appt Specialist Hospital f/u appt confirmed? Yes  Scheduled to see oncologist on 07/29/22. Are transportation arrangements needed? No -States she uses Ambulance person for medical appts and [facility] center provides transportation to Cancer appts If their condition worsens, is the pt aware to call PCP or go to the Emergency Dept.? Yes Was the patient provided with contact information for the PCP's office or ED? Yes Was to pt encouraged to call back with questions or concerns? Yes  SDOH assessments and interventions completed:   Yes   Care Coordination Interventions:  Referred for Care Coordination Services:  RN Care Coordinator Education provided    Encounter Outcome:  Pt. Visit Completed    Enzo Montgomery, RN,BSN,CCM [place] Management Telephonic Care Management Coordinator Direct Phone: [phone] Toll Free: [phone] Fax: [phone]

## 2022-07-23 NOTE — Telephone Encounter (Signed)
  Chief Complaint: requesting appt hospital f/u . Reports shortness of breath with exertion Symptoms: wears O2 at 2 L/ min [place]. Has not checked O2 sat. Recommended finding pulse ox and checking and call back if less than 90. Patient reports shortness of breath with exertion, fullness in chest with coughing.  Frequency: 07/16/22. Pertinent Negatives: Patient denies chest pain no difficulty breathing shortness of breath not new.  Disposition: '[]'$ ED /'[]'$ Urgent Care (no appt availability in office) / '[x]'$ Appointment(In office/virtual)/ '[]'$  [facility]/ '[]'$ Home Care/ '[]'$ Refused Recommended Disposition /'[]'$ [facility]/ '[]'$  Follow-up with PCP Additional Notes:   Hospital f/u appt scheduled for 07/25/22 at 9:10 am. Please advise if sooner appt available. Instructed patient if sx worsen go back to ED.     Reason for Disposition  [1] MODERATE longstanding difficulty breathing (e.g., speaks in phrases, SOB even at rest, pulse 100-120) AND [2] SAME as normal  Answer Assessment - Initial Assessment Questions 1. RESPIRATORY STATUS: "Describe your breathing?" (e.g., wheezing, shortness of breath, unable to speak, severe coughing)      Shortness of breath with exertion 2. ONSET: "When did this breathing problem begin?"      Prior to 07/16/22 3. PATTERN "Does the difficult breathing come and go, or has it been constant since it started?"      *No Answer* 4. SEVERITY: "How bad is your breathing?" (e.g., mild, moderate, severe)    - MILD: No SOB at rest, mild SOB with walking, speaks normally in sentences, can lie down, no retractions, pulse < 100.    - MODERATE: SOB at rest, SOB with minimal exertion and prefers to sit, cannot lie down flat, speaks in phrases, mild retractions, audible wheezing, pulse 100-120.    - SEVERE: Very SOB at rest, speaks in single words, struggling to breathe, sitting hunched forward, retractions, pulse > 120      *No Answer* 5. RECURRENT SYMPTOM: "Have you had difficulty  breathing before?" If Yes, ask: "When was the last time?" and "What happened that time?"      *No Answer* 6. CARDIAC HISTORY: "Do you have any history of heart disease?" (e.g., heart attack, angina, bypass surgery, angioplasty)      *No Answer* 7. LUNG HISTORY: "Do you have any history of lung disease?"  (e.g., pulmonary embolus, asthma, emphysema)     *No Answer* 8. CAUSE: "What do you think is causing the breathing problem?"      *No Answer* 9. OTHER SYMPTOMS: "Do you have any other symptoms? (e.g., dizziness, runny nose, cough, chest pain, fever)     Chest fullness with coughing and shorter of breath  10. O2 SATURATION MONITOR:  "Do you use an oxygen saturation monitor (pulse oximeter) at home?" If Yes, ask: "What is your reading (oxygen level) today?" "What is your usual oxygen saturation reading?" (e.g., 95%)       Encouraged to check . Wears O2 at 2 L/min [place]  11. PREGNANCY: "Is there any chance you are pregnant?" "When was your last menstrual period?"       na 12. TRAVEL: "Have you traveled out of the country in the last month?" (e.g., travel history, exposures)       na  Protocols used: Breathing Difficulty-A-AH

## 2022-07-23 NOTE — Telephone Encounter (Signed)
Phone call placed to patient this afternoon.  I left a voicemail message informing her that I received message of her call to the office today.  Advised that if she is not feeling well she should be seen in the emergency room.

## 2022-07-25 ENCOUNTER — Encounter: Payer: Self-pay | Admitting: Internal Medicine

## 2022-07-25 ENCOUNTER — Other Ambulatory Visit: Payer: Self-pay

## 2022-07-25 ENCOUNTER — Ambulatory Visit: Payer: Medicare HMO | Attending: Internal Medicine | Admitting: Internal Medicine

## 2022-07-25 VITALS — BP 207/80 | HR 82 | Temp 97.7°F | Ht 69.0 in | Wt 115.0 lb

## 2022-07-25 DIAGNOSIS — J438 Other emphysema: Secondary | ICD-10-CM | POA: Diagnosis not present

## 2022-07-25 DIAGNOSIS — J9611 Chronic respiratory failure with hypoxia: Secondary | ICD-10-CM

## 2022-07-25 DIAGNOSIS — R0609 Other forms of dyspnea: Secondary | ICD-10-CM

## 2022-07-25 DIAGNOSIS — I10 Essential (primary) hypertension: Secondary | ICD-10-CM

## 2022-07-25 DIAGNOSIS — J439 Emphysema, unspecified: Secondary | ICD-10-CM | POA: Diagnosis not present

## 2022-07-25 DIAGNOSIS — E1122 Type 2 diabetes mellitus with diabetic chronic kidney disease: Secondary | ICD-10-CM | POA: Diagnosis not present

## 2022-07-25 MED ORDER — BREZTRI AEROSPHERE 160-9-4.8 MCG/ACT IN AERO: 2.0000 | INHALATION_SPRAY | Freq: Two times a day (BID) | RESPIRATORY_TRACT | 11 refills | Status: AC

## 2022-07-25 MED ORDER — PREDNISONE 20 MG PO TABS: 20.0000 mg | ORAL_TABLET | Freq: Every day | ORAL | 0 refills | Status: DC

## 2022-07-25 NOTE — Patient Instructions (Signed)
Please keep your oxygen level on 2 L while at rest and at nights. Please increase your oxygen to 3 L when you are up moving around. We will work on getting you a portable oxygen device.  Use the albuterol inhaler before you get up to move around. We have added an inhaler called Breztri to use twice a day.  Please remember to wash your mouth with warm water after each use.

## 2022-07-25 NOTE — Progress Notes (Signed)
Patient ID: Tina Waller, female    DOB: 08-25-1940  MRN: [id number]  CC: Follow-up (ER visit f/u. HTN & hypoxia.)   Subjective: Tina Waller is a 82 y.o. female who presents for ER f/u Her concerns today include:  patient with history of HTN, CKD 3b with nephrotic range proteinuria (patient has declined biopsy ), HL, Rectal CA vit D deficiency, osteoporosis on Dexa scan done fall 2017, primary hyperparathyroidism,  depression, CVA in 2008 and 09/2018, vertebral artery stenosis and gout.   Patient was sent from her oncologist office on 07/16/2022 to the emergency room due to reports of DOE x 2 weeks and noted hypoxia in the office at 78% on room air. Workup in the ER included CTA of chest negative for PE but did show extensive emphysematous changes with some scarring.  BNP mildly elevated in the 300s.  Respiratory panel negative for COVID and influenza.  CBC showed mild stable anemia with H/H of 10.6/29.9.  BNP showed GFR of 41 with range over the past several months between 34-42.  Told to follow-up with PCP for PFTs. Given Allbuterol with spacer from ER.  Used it 2x then accidentally dropped it Pox on 2 LT 92-94.  Called in yesterday with Pox 85% on 2 liters; however pt states it was not that low.  She increase O2 to 3 LT and Pox 95-97%.   Started noticing dyspnea on minimal exertion 2 wk ago. Has home O2 which she used to use only at nights.  Over the past 2 weeks she has had to use it continuously especially when up moving around. Has a wet cough but not able to get up phlegm x 2 wks.  But has noticed wheezing that is more pronounced after moving around.  No SOB at rest.  Sleeps on 1 pillow.  No PND.  No CP or fever Hx of smoking for yrs.  Quit in 2008.   Has not taken BP meds as yet this a.m Checks BP Q a.m and reports levels have not been as high as it is today.  Range 150-170/70-80s  Patient Active Problem List   Diagnosis Date Noted   Hypoxia 07/16/2022   Hypomagnesemia  06/23/2022   Goals of care, counseling/discussion 06/22/2022   Rectal cancer ([place])    Protein-calorie malnutrition, severe 08/19/2021   GI bleeding 08/12/2021   GI bleed 08/11/2021   COVID-19 virus infection 08/11/2021   Acute renal failure superimposed on stage 3 chronic kidney disease ([place]) 08/11/2021   Thrombocytopenia ([place])    Cellulitis of left lower extremity 04/20/2020   Elevated alkaline phosphatase level 07/28/2019   Microscopic hematuria 09/24/2018   Rectal bleeding 09/24/2018   Insomnia 09/24/2018   Anxiety 09/14/2018   Primary hyperparathyroidism ([place]) 02/10/2018   Vitamin B 12 deficiency 12/11/2017   Pulmonary hypertension, mild ([place]) 12/11/2017   CKD (chronic kidney disease) stage 3, GFR 30-59 ml/min ([place]) 04/17/2017   Stasis dermatitis of both legs 04/17/2017   Osteoporosis 12/22/2016   Hx of gout 12/22/2016   Estrogen deficiency 05/12/2016   Vitamin D deficiency 05/12/2016   Weight loss 05/12/2016   HTN (hypertension) 06/12/2015   History of CVA (cerebrovascular accident) 03/28/2015     Current Outpatient Medications on File Prior to Visit  Medication Sig Dispense Refill   acetaminophen (TYLENOL) 500 MG tablet Take 500-1,000 mg by mouth every 6 (six) hours as needed for moderate pain or headache.     albuterol (VENTOLIN HFA) 108 (90 Base) MCG/ACT inhaler Inhale 2  puffs into the lungs every 6 (six) hours as needed for wheezing or shortness of breath.     amLODipine (NORVASC) 10 MG tablet Take 1 tablet (10 mg total) by mouth daily. 90 tablet 1   ammonium lactate (LAC-HYDRIN) 12 % lotion Apply 1 Application topically as needed for dry skin. 400 g 5   capecitabine (XELODA) 500 MG tablet Take 2 tablets by mouth every 12 hours for 14 days, then off for 7 days. Repeat every 21 days. Take within 30 minutes after meals. 56 tablet 1   cholecalciferol (VITAMIN D3) 25 MCG (1000 UNIT) tablet Take 2 tablets (2,000 Units total) by mouth daily. (Patient taking differently: Take  10,000 Units by mouth daily.) 100 tablet 1   clindamycin (CLINDAGEL) 1 % gel Apply topically 2 (two) times daily as needed. 30 g 0   clopidogrel (PLAVIX) 75 MG tablet Take 1 tablet (75 mg total) by mouth daily. 90 tablet 1   colchicine 0.6 MG tablet Take 1 tablet (0.6 mg total) by mouth daily. 14 tablet 0   ferrous gluconate (FERGON) 324 MG tablet Take 1 tablet (324 mg total) by mouth daily with breakfast. 30 tablet 0   furosemide (LASIX) 40 MG tablet TAKE 1 TABLET TWICE DAILY 180 tablet 1   isosorbide mononitrate (IMDUR) 60 MG 24 hr tablet TAKE 1 AND 1/2 TABLETS EVERY DAY 135 tablet 3   labetalol (NORMODYNE) 200 MG tablet Take 1 tablet (200 mg total) by mouth 2 (two) times daily. 180 tablet 2   losartan (COZAAR) 100 MG tablet TAKE 1 TABLET EVERY DAY 90 tablet 0   magnesium oxide (MAG-OX) 400 (240 Mg) MG tablet Take 1 tablet (400 mg total) by mouth daily. 30 tablet 1   mometasone (ELOCON) 0.1 % ointment Apply topically daily. (Patient taking differently: Apply 1 Application topically daily as needed (Eczema).) 45 g 0   Multiple Vitamin (MULTIVITAMIN WITH MINERALS) TABS tablet Take 1 tablet by mouth daily. 30 tablet 0   ondansetron (ZOFRAN) 4 MG tablet Take 1 tablet (4 mg total) by mouth every 8 (eight) hours as needed for nausea or vomiting. 20 tablet 0   polyethylene glycol (MIRALAX) packet Take 17 g by mouth daily as needed for mild constipation.     potassium chloride (KLOR-CON M) 10 MEQ tablet Take 1 tablet (10 mEq total) by mouth 2 (two) times daily. 14 tablet 0   pravastatin (PRAVACHOL) 80 MG tablet TAKE 1 TABLET EVERY DAY FOR HIGH CHOLESTEROL 90 tablet 1   vitamin B-12 (CYANOCOBALAMIN) 1000 MCG tablet Take 1,000 mcg by mouth daily.     No current facility-administered medications on file prior to visit.    Allergies  Allergen Reactions   Lisinopril Cough   Carvedilol Diarrhea   Hydralazine Diarrhea and Nausea And Vomiting   Lipitor [Atorvastatin] Diarrhea    Social History    Socioeconomic History   Marital status: Divorced    Spouse name: Not on file   Number of children: Not on file   Years of education: Not on file   Highest education level: Not on file  Occupational History   Not on file  Tobacco Use   Smoking status: Former    Types: Cigarettes    Quit date: 07/14/2006    Years since quitting: 16.0   Smokeless tobacco: Never  Vaping Use   Vaping Use: Never used  Substance and Sexual Activity   Alcohol use: Not Currently    Alcohol/week: 4.0 standard drinks of alcohol  Types: 4 Shots of liquor per week    Comment: none since Jan   Drug use: No   Sexual activity: Not Currently  Other Topics Concern   Not on file  Social History Narrative   Not on file   Social Determinants of Health   Financial Resource Strain: Not on file  Food Insecurity: Not on file  Transportation Needs: Not on file  Physical Activity: Not on file  Stress: Not on file  Social Connections: Not on file  Intimate Partner Violence: Not At Risk (10/08/2021)   Humiliation, Afraid, Rape, and Kick questionnaire    Fear of Current or Ex-Partner: No    Emotionally Abused: No    Physically Abused: No    Sexually Abused: No    Family History  Problem Relation Age of Onset   Heart disease Mother        CHF   Osteoporosis Neg Hx    Colon cancer Neg Hx    Stomach cancer Neg Hx    Pancreatic cancer Neg Hx    Esophageal cancer Neg Hx    Rectal cancer Neg Hx     Past Surgical History:  Procedure Laterality Date   ABDOMINAL HYSTERECTOMY     BIOPSY  08/15/2021   Procedure: BIOPSY;  Surgeon: Rush Landmark, Telford Nab., MD;  Location: [facility];  Service: Gastroenterology;;   BIOPSY  04/17/2022   Procedure: BIOPSY;  Surgeon: Irving Copas., MD;  Location: [facility];  Service: Gastroenterology;;   BREAST SURGERY     lumpectomy   BUNIONECTOMY     CATARACT EXTRACTION Left    COLONOSCOPY     COLONOSCOPY W/ BIOPSIES AND POLYPECTOMY     COLONOSCOPY WITH  PROPOFOL N/A 08/15/2021   Procedure: COLONOSCOPY WITH PROPOFOL;  Surgeon: Irving Copas., MD;  Location: [facility];  Service: Gastroenterology;  Laterality: N/A;   ESOPHAGOGASTRODUODENOSCOPY (EGD) WITH PROPOFOL N/A 08/15/2021   Procedure: ESOPHAGOGASTRODUODENOSCOPY (EGD) WITH PROPOFOL;  Surgeon: Rush Landmark Telford Nab., MD;  Location: [facility];  Service: Gastroenterology;  Laterality: N/A;   FLEXIBLE SIGMOIDOSCOPY N/A 04/17/2022   Procedure: FLEXIBLE SIGMOIDOSCOPY;  Surgeon: Rush Landmark Telford Nab., MD;  Location: [facility];  Service: Gastroenterology;  Laterality: N/A;   HEMOSTASIS CLIP PLACEMENT  08/15/2021   Procedure: HEMOSTASIS CLIP PLACEMENT;  Surgeon: Irving Copas., MD;  Location: [facility];  Service: Gastroenterology;;   HOT HEMOSTASIS N/A 08/15/2021   Procedure: HOT HEMOSTASIS (ARGON PLASMA COAGULATION/BICAP);  Surgeon: Irving Copas., MD;  Location: [facility];  Service: Gastroenterology;  Laterality: N/A;   MULTIPLE TOOTH EXTRACTIONS     POLYPECTOMY  08/15/2021   Procedure: POLYPECTOMY;  Surgeon: Mansouraty, Telford Nab., MD;  Location: [facility];  Service: Gastroenterology;;   SUBMUCOSAL TATTOO INJECTION  08/15/2021   Procedure: SUBMUCOSAL TATTOO INJECTION;  Surgeon: Irving Copas., MD;  Location: [facility];  Service: Gastroenterology;;   TONGUE BIOPSY Right 12/13/2018   Procedure: RIGHT TONSIL BIOPSY;  Surgeon: Melida Quitter, MD;  Location: [facility];  Service: ENT;  Laterality: Right;   UPPER GASTROINTESTINAL ENDOSCOPY      ROS: Review of Systems Negative except as stated above  PHYSICAL EXAM: BP (!) 207/80 (BP Location: Left Arm, Patient Position: Sitting, Cuff Size: Normal)   Pulse 82   Temp 97.7 F (36.5 C) (Oral)   Ht '5\' 9"'$  (1.753 m)   Wt 115 lb (52.2 kg)   SpO2 (!) 89%   BMI 16.98 kg/m   Physical Exam Pulse ox ambulation room air 78% lowest level. Pulse ox rest  room air 88 to 93% Pulse ox rest on 2 L O2 94 to  96% Pulse ox ambulation 2 L O2 87 to 94% with most of the readings in the 80s. Pulse ox ambulation 3 L O2 96 to 98%  General appearance -frail elderly African-American female in NAD.  Slight dyspnea with ambulation Mental status - normal mood, behavior, speech, dress, motor activity, and thought processes Neck - supple, no significant adenopathy Chest -breath sounds moderately decreased bilaterally without crackles or wheezes Heart -regular rate rhythm Extremities -trace lower extremity edema mainly around the ankles      Latest Ref Rng & Units 07/16/2022    1:03 PM 07/16/2022    9:49 AM 06/23/2022    1:30 PM  CMP  Glucose 70 - 99 mg/dL 108  107  133   BUN 8 - 23 mg/dL '22  24  30   '$ Creatinine 0.44 - 1.00 mg/dL 1.23  1.32  1.54   Sodium 135 - 145 mmol/L 140  143  142   Potassium 3.5 - 5.1 mmol/L 4.2  4.9  4.0   Chloride 98 - 111 mmol/L 107  106  107   CO2 22 - 32 mmol/L '23  28  26   '$ Calcium 8.9 - 10.3 mg/dL 9.2  9.9  9.3   Total Protein 6.5 - 8.1 g/dL  6.9  6.8   Total Bilirubin 0.3 - 1.2 mg/dL  0.7  0.4   Alkaline Phos 38 - 126 U/L  116  96   AST 15 - 41 U/L  14  12   ALT 0 - 44 U/L  6  6    Lipid Panel     Component Value Date/Time   CHOL 166 07/26/2020 1503   TRIG 122 07/26/2020 1503   HDL 53 07/26/2020 1503   CHOLHDL 3.1 07/26/2020 1503   CHOLHDL 5.2 09/14/2018 0511   VLDL 15 09/14/2018 0511   LDLCALC 91 07/26/2020 1503    CBC    Component Value Date/Time   WBC 5.1 07/16/2022 0949   WBC 3.8 (L) 06/18/2022 2232   RBC 3.16 (L) 07/16/2022 0949   HGB 10.6 (L) 07/16/2022 0949   HGB 13.1 04/19/2021 1459   HCT 29.9 (L) 07/16/2022 0949   HCT 37.5 04/19/2021 1459   PLT 192 07/16/2022 0949   PLT 232 04/19/2021 1459   MCV 94.6 07/16/2022 0949   MCV 95 04/19/2021 1459   MCH 33.5 07/16/2022 0949   MCHC 35.5 07/16/2022 0949   RDW 15.9 (H) 07/16/2022 0949   RDW 11.6 (L) 04/19/2021 1459   LYMPHSABS 1.1 07/16/2022 0949   LYMPHSABS 2.0 07/26/2020 1503   MONOABS 0.8  07/16/2022 0949   EOSABS 0.3 07/16/2022 0949   EOSABS 0.2 07/26/2020 1503   BASOSABS 0.0 07/16/2022 0949   BASOSABS 0.0 07/26/2020 1503    ASSESSMENT AND PLAN: 1. DOE (dyspnea on exertion) I suspect likely pulmonary cause as CTA of the chest showed stable advanced emphysematous changes and pulmonary scarring.  We will plan to get PFTs.  Given mild elevation in BNP we will also check an echo.  Last echo done back in 2020 showed normal heart function. -Advised to use home O2 continuous: 2 L at rest and 3 L with ambulation.  Will put an order to get portable O2 device - ECHOCARDIOGRAM COMPLETE; Future  2. Pulmonary emphysema, unspecified emphysema type ([place]) Continue albuterol inhaler with spacer.  Advised to use it before any ambulation. -Start Breztri inhaler Clinical pharmacist met with  her today to teach technique Short course of prednisone. Referral sent to pulmonary - Budeson-Glycopyrrol-Formoterol (BREZTRI AEROSPHERE) 160-9-4.8 MCG/ACT AERO; Inhale 2 puffs into the lungs 2 (two) times daily.  Dispense: 10.7 g; Refill: 11 - predniSONE (DELTASONE) 20 MG tablet; Take 1 tablet (20 mg total) by mouth daily with breakfast.  Dispense: 5 tablet; Refill: 0 - Ambulatory referral to Pulmonology - Pulmonary function test; Future  3. Hypoxemic respiratory failure, chronic ([place]) See plan under #2. - Ambulatory referral to Pulmonology  4. Essential hypertension Not at goal.  She has not taken her medicines as yet for the morning.  Advised to take them when she returns home.     Patient was given the opportunity to ask questions.  Patient verbalized understanding of the plan and was able to repeat key elements of the plan.   This documentation was completed using Radio producer.  Any transcriptional errors are unintentional.  No orders of the defined types were placed in this encounter.    Requested Prescriptions    No prescriptions requested or ordered in this  encounter    No follow-ups on file.  Karle Plumber, MD, FACP

## 2022-07-26 NOTE — Progress Notes (Unsigned)
[place] OFFICE PROGRESS NOTE  [name], MD [address] [place] [address]  DIAGNOSIS: Rectal cancer  Oncology History  Rectal cancer [facility])  08/15/2021 Cancer Staging   Staging form: Colon and Rectum, AJCC 8th Edition - Clinical stage from 08/15/2021: Stage I (cT2, cN0, cM0) - Signed by Truitt Merle, MD on 09/05/2021 Stage prefix: Initial diagnosis Total positive nodes: 0   08/15/2021 Procedure   Colonoscopy, Dr. Rush Landmark  Impression: - Palpable rectal mass found on digital rectal exam. - The examined portion of the ileum was normal. - Four 3 to 7 mm polyps at the recto-sigmoid colon, in the descending colon and in the ascending colon, removed with a cold snare. Resected and retrieved. - Rule out malignancy, partially obstructing tumor in the proximal/middle rectum. Biopsied. Tattooed proximal to lesion. - Diverticulosis in the entire examined colon. - Normal mucosa in the entire examined colon otherwise. - Non-bleeding non-thrombosed external and internal hemorrhoids.   08/15/2021 Initial Biopsy   FINAL MICROSCOPIC DIAGNOSIS:   A. STOMACH, BIOPSY:  - Gastric antral and oxyntic mucosa with nonspecific reactive  gastropathy  - Helicobacter pylori-like organisms are not identified on routine HE stain   B. DUODENUM, BIOPSY:  - Duodenal mucosa with no specific histopathologic changes  - Negative for increased intraepithelial lymphocytes or villous  architectural changes   C. COLON, ASCENDING, DESCENDING AND SIGMOID, POLYPECTOMY:  - Tubular adenoma(s)  - Negative for high-grade dysplasia or malignancy   D. RECTAL MASS, BIOPSY:  - Fragments of an adenoma with multifocal high-grade dysplasia, focally suspicious for intramucosal carcinoma.  See comment    COMMENT:  D.  A deeper, more severe process cannot be ruled out.  Clinical and endoscopic correlation is suggested.    08/16/2021 Imaging   EXAM: CT CHEST, ABDOMEN, AND PELVIS WITH  CONTRAST  IMPRESSION: 1. Large rectal mass. Two tiny hypodense lesions in the liver are nonspecific with regard to benign etiology or metastatic disease. 2. 9 mm in long axis hypodense pancreatic body lesion. Possibilities include postinflammatory cyst, intraductal papillary mucinous neoplasm, or less likely pancreatic adenocarcinoma. 3. Bilateral renal cysts. A complex right kidney upper pole lesion is indeterminate for complex cyst versus mass and measures 9 mm in diameter. 4. In light of these findings, consider upper abdominal MRI with contrast to further characterize the hepatic, pancreatic, and renal lesions. 5. There is also unusual left perirenal stranding especially adjacent to a cyst along the left kidney lower pole, this could be an indicator of inflammation involving the left kidney but is technically nonspecific. Some of this edema tracks along the left paracolic gutter. 6. Advanced systemic atherosclerosis. This includes aortic and coronary atherosclerosis along with mesenteric and renal atherosclerosis. If further workup of the patient's severe atherosclerotic vascular disease is warranted, CT angiography would be recommended. 7. Small bilateral pleural effusions with passive atelectasis. 8. Severe centrilobular emphysema.  Emphysema (ICD10-J43.9). 9. Mild thoracolumbar scoliosis. 10. Scattered sigmoid colon diverticula.   08/18/2021 Imaging   EXAM: MRI ABDOMEN WITHOUT AND WITH CONTRAST (INCLUDING MRCP)  IMPRESSION: 1. Study is severely limited by beam hardening artifact from a large surgical clip in the stomach, which completely obscures the previously noted pancreatic lesion on most pulse sequences rendering today's study nondiagnostic. The only visualized hepatic lesion is compatible with a small simple hepatic cyst. 2. There is abnormal signal intensity and small rim enhancing lesions in the right psoas muscle. Clinical correlation for signs and symptoms of psoas  abscess is recommended.  Alternatively, the possibility of intramuscular metastasis could be considered. 3. Small bilateral pleural effusions lying dependently with areas of presumed passive atelectasis in the lower lobes of the lungs bilaterally. 4. Multiple simple cysts in the kidneys bilaterally. 5. Aortic atherosclerosis.   08/20/2021 Initial Diagnosis   Rectal cancer ([place])   08/21/2021 Imaging   EXAM: MRI PELVIS WITHOUT CONTRAST  IMPRESSION: Rectal adenocarcinoma T stage: T2   Rectal adenocarcinoma N stage:  N0   Distance from tumor to the internal anal sphincter is 4.5 cm.   06/09/2022 -  Chemotherapy   Patient is on Treatment Plan : COLORECTAL Panitumumab q14d ([facility] - Type Gene Only)        CURRENT THERAPY: Oral chemo Xeloda day 1-14 every 21 days IV Vectibix every 2 weeks  INTERVAL HISTORY: Tina Waller 82 y.o. female returns to the clinic today for a follow-up visit.  The patient was last seen by Dr. Burr Medico on 07/16/22.  When the patient presented to the clinic that day, she was hypoxic.  Therefore, she was subsequently sent to the emergency room.  She had a CTA which was negative for PE but showed extensive emphysematous changes and some scarring.  Her BNP was mildly elevated.  Respiratory panel negative for infection.  She was discharged to close follow-up with her PCP for consideration of PFTs.  She saw her PCP on 05/26/2023.  The patient has supplemental oxygen at home and they are working on getting her portable oxygen tank.  She is supposed to be wearing 3 L of oxygen. PFTs were ordered.  The patient was referred to pulmonary medicine.  The patient is going to have a repeat echocardiogram for mildly elevated BNP.  She was also given short course of prednisone and Breztri inhalers. She is still on prednisone. She thinks this helped but she notes that her breath is typically "shallow" at baseline.   Overall, she is feeling fairly well at this time. She reports some  poor appetite secondary to taste alterations. No evidence of oral thrush. She gained 3 lbs since last being seen. She is scheduled to see a member of the nutritionist today. Denies any fever, chills, or night sweats.  Denies any chest pain, cough, or hemoptysis.  Denies any nausea, vomiting, diarrhea, or constipation. Denies any headache or visual changes. Denies any current rashes or skin changes except she has a mild rash on her chin. She didn't know she can use her cream on her chin. She takes 1 tablet of magnesium per day.    She tends to have uncontrolled hypertension in the clinic. Her BP is elevated today. She was in a rush this AM and did not take any of her BP meds today. She is asymptomatic.  She is here today for evaluation repeat blood work before undergoing cycle #3 with Vectibix.      MEDICAL HISTORY: Past Medical History:  Diagnosis Date   Anemia    Anxiety    Cancer ([place])    rectal   Cataract    Depression    Diabetes mellitus without complication ([place])    pt denies   GERD (gastroesophageal reflux disease)    Gout    Headache    Hyperlipidemia    Hypertension    Oxygen deficiency    wears oxygen at noc   Stroke [facility]) 2008   09/14/2018   Tonsillar mass    Type 2 diabetes mellitus ([place]) 09/14/2018   Vertigo    Wears dentures  upper   Wears glasses     ALLERGIES:  is allergic to lisinopril, carvedilol, hydralazine, and lipitor [atorvastatin].  MEDICATIONS:  Current Outpatient Medications  Medication Sig Dispense Refill   acetaminophen (TYLENOL) 500 MG tablet Take 500-1,000 mg by mouth every 6 (six) hours as needed for moderate pain or headache.     albuterol (VENTOLIN HFA) 108 (90 Base) MCG/ACT inhaler Inhale 2 puffs into the lungs every 6 (six) hours as needed for wheezing or shortness of breath.     amLODipine (NORVASC) 10 MG tablet Take 1 tablet (10 mg total) by mouth daily. 90 tablet 1   ammonium lactate (LAC-HYDRIN) 12 % lotion Apply 1 Application  topically as needed for dry skin. 400 g 5   Budeson-Glycopyrrol-Formoterol (BREZTRI AEROSPHERE) 160-9-4.8 MCG/ACT AERO Inhale 2 puffs into the lungs 2 (two) times daily. 10.7 g 11   capecitabine (XELODA) 500 MG tablet Take 2 tablets by mouth every 12 hours for 14 days, then off for 7 days. Repeat every 21 days. Take within 30 minutes after meals. 56 tablet 1   cholecalciferol (VITAMIN D3) 25 MCG (1000 UNIT) tablet Take 2 tablets (2,000 Units total) by mouth daily. (Patient taking differently: Take 10,000 Units by mouth daily.) 100 tablet 1   clindamycin (CLINDAGEL) 1 % gel Apply topically 2 (two) times daily as needed. 30 g 0   clopidogrel (PLAVIX) 75 MG tablet Take 1 tablet (75 mg total) by mouth daily. 90 tablet 1   colchicine 0.6 MG tablet Take 1 tablet (0.6 mg total) by mouth daily. 14 tablet 0   ferrous gluconate (FERGON) 324 MG tablet Take 1 tablet (324 mg total) by mouth daily with breakfast. 30 tablet 0   furosemide (LASIX) 40 MG tablet TAKE 1 TABLET TWICE DAILY 180 tablet 1   isosorbide mononitrate (IMDUR) 60 MG 24 hr tablet TAKE 1 AND 1/2 TABLETS EVERY DAY 135 tablet 3   labetalol (NORMODYNE) 200 MG tablet Take 1 tablet (200 mg total) by mouth 2 (two) times daily. 180 tablet 2   losartan (COZAAR) 100 MG tablet TAKE 1 TABLET EVERY DAY 90 tablet 0   magnesium oxide (MAG-OX) 400 (240 Mg) MG tablet Take 1 tablet (400 mg total) by mouth daily. 30 tablet 1   mometasone (ELOCON) 0.1 % ointment Apply topically daily. (Patient taking differently: Apply 1 Application topically daily as needed (Eczema).) 45 g 0   Multiple Vitamin (MULTIVITAMIN WITH MINERALS) TABS tablet Take 1 tablet by mouth daily. 30 tablet 0   ondansetron (ZOFRAN) 4 MG tablet Take 1 tablet (4 mg total) by mouth every 8 (eight) hours as needed for nausea or vomiting. 20 tablet 0   polyethylene glycol (MIRALAX) packet Take 17 g by mouth daily as needed for mild constipation.     potassium chloride (KLOR-CON M) 10 MEQ tablet Take 1  tablet (10 mEq total) by mouth 2 (two) times daily. 14 tablet 0   pravastatin (PRAVACHOL) 80 MG tablet TAKE 1 TABLET EVERY DAY FOR HIGH CHOLESTEROL 90 tablet 1   predniSONE (DELTASONE) 20 MG tablet Take 1 tablet (20 mg total) by mouth daily with breakfast. 5 tablet 0   vitamin B-12 (CYANOCOBALAMIN) 1000 MCG tablet Take 1,000 mcg by mouth daily.     No current facility-administered medications for this visit.    SURGICAL HISTORY:  Past Surgical History:  Procedure Laterality Date   ABDOMINAL HYSTERECTOMY     BIOPSY  08/15/2021   Procedure: BIOPSY;  Surgeon: Rush Landmark Telford Nab., MD;  Location: [facility]  ENDOSCOPY;  Service: Gastroenterology;;   BIOPSY  04/17/2022   Procedure: BIOPSY;  Surgeon: Irving Copas., MD;  Location: [facility] ENDOSCOPY;  Service: Gastroenterology;;   BREAST SURGERY     lumpectomy   BUNIONECTOMY     CATARACT EXTRACTION Left    COLONOSCOPY     COLONOSCOPY W/ BIOPSIES AND POLYPECTOMY     COLONOSCOPY WITH PROPOFOL N/A 08/15/2021   Procedure: COLONOSCOPY WITH PROPOFOL;  Surgeon: Irving Copas., MD;  Location: [place];  Service: Gastroenterology;  Laterality: N/A;   ESOPHAGOGASTRODUODENOSCOPY (EGD) WITH PROPOFOL N/A 08/15/2021   Procedure: ESOPHAGOGASTRODUODENOSCOPY (EGD) WITH PROPOFOL;  Surgeon: Rush Landmark Telford Nab., MD;  Location: [place];  Service: Gastroenterology;  Laterality: N/A;   FLEXIBLE SIGMOIDOSCOPY N/A 04/17/2022   Procedure: FLEXIBLE SIGMOIDOSCOPY;  Surgeon: Rush Landmark Telford Nab., MD;  Location: [facility];  Service: Gastroenterology;  Laterality: N/A;   HEMOSTASIS CLIP PLACEMENT  08/15/2021   Procedure: HEMOSTASIS CLIP PLACEMENT;  Surgeon: Irving Copas., MD;  Location: [place];  Service: Gastroenterology;;   HOT HEMOSTASIS N/A 08/15/2021   Procedure: HOT HEMOSTASIS (ARGON PLASMA COAGULATION/BICAP);  Surgeon: Irving Copas., MD;  Location: [facility];  Service: Gastroenterology;  Laterality: N/A;    MULTIPLE TOOTH EXTRACTIONS     POLYPECTOMY  08/15/2021   Procedure: POLYPECTOMY;  Surgeon: Mansouraty, Telford Nab., MD;  Location: [place];  Service: Gastroenterology;;   SUBMUCOSAL TATTOO INJECTION  08/15/2021   Procedure: SUBMUCOSAL TATTOO INJECTION;  Surgeon: Irving Copas., MD;  Location: [place];  Service: Gastroenterology;;   TONGUE BIOPSY Right 12/13/2018   Procedure: RIGHT TONSIL BIOPSY;  Surgeon: Melida Quitter, MD;  Location: [place];  Service: ENT;  Laterality: Right;   UPPER GASTROINTESTINAL ENDOSCOPY      REVIEW OF SYSTEMS:   Constitutional: Positive for decreased appetite. Negative for chills, fatigue, fever and unexpected weight change.  HENT: Positive for taste alterations. Negative for mouth sores, nosebleeds, sore throat and trouble swallowing.   Eyes: Negative for eye problems and icterus.  Respiratory: Positive for similar shortness of breath. Negative for cough, hemoptysis, and wheezing.   Cardiovascular: Negative for chest pain and leg swelling.  Gastrointestinal: Negative for abdominal pain, constipation, diarrhea, nausea and vomiting.  Genitourinary: Negative for bladder incontinence, difficulty urinating, dysuria, frequency and hematuria.   Musculoskeletal: Negative for back pain, gait problem, neck pain and neck stiffness.  Skin: positive for rash on chin.  Neurological: Negative for dizziness, extremity weakness, gait problem, headaches, light-headedness and seizures.  Hematological: Negative for adenopathy. Does not bruise/bleed easily.  Psychiatric/Behavioral: Negative for confusion, depression and sleep disturbance. The patient is not nervous/anxious.      PHYSICAL EXAMINATION:  Blood pressure (!) 192/76, pulse 79, temperature 98.2 F (36.8 C), temperature source Oral, resp. rate 19, height '5\' 9"'$  (1.753 m), weight 118 lb 12.8 oz (53.9 kg), SpO2 96 %.  ECOG PERFORMANCE STATUS: 2  Physical Exam  Constitutional: Oriented to person, place, and  time and well-developed, well-nourished, and in no distress.  HENT:  Head: Normocephalic and atraumatic.  Mouth/Throat: Oropharynx is clear and moist. No oropharyngeal exudate.  Eyes: Conjunctivae are normal. Right eye exhibits no discharge. Left eye exhibits no discharge. No scleral icterus.  Neck: Normal range of motion. Neck supple.  Cardiovascular: Normal rate, regular rhythm, normal heart sounds and intact distal pulses.   Pulmonary/Chest: Effort normal and breath sounds normal. No respiratory distress. No wheezes. No rales.  Abdominal: Soft. Bowel sounds are normal. Exhibits no distension and no mass. There is no tenderness.  Musculoskeletal: Normal  range of motion. Exhibits no edema.  Lymphadenopathy:    No cervical adenopathy.  Neurological: Alert and oriented to person, place, and time. Exhibits normal muscle tone. Examined in wheelchair.  Skin: Skin is warm and dry. No rash noted. Not diaphoretic. No erythema. No pallor.  Psychiatric: Mood, memory and judgment normal.  Vitals reviewed.    LABORATORY DATA: Lab Results  Component Value Date   WBC 3.5 (L) 07/29/2022   HGB 9.8 (L) 07/29/2022   HCT 28.2 (L) 07/29/2022   MCV 96.2 07/29/2022   PLT 183 07/29/2022      Chemistry      Component Value Date/Time   NA 142 07/29/2022 0805   NA 142 04/19/2021 1459   K 4.0 07/29/2022 0805   CL 105 07/29/2022 0805   CO2 29 07/29/2022 0805   BUN 24 (H) 07/29/2022 0805   BUN 37 (H) 04/19/2021 1459   CREATININE 1.43 (H) 07/29/2022 0805   CREATININE 1.35 (H) 04/21/2016 1437      Component Value Date/Time   CALCIUM 9.9 07/29/2022 0805   ALKPHOS 105 07/29/2022 0805   AST 12 (L) 07/29/2022 0805   ALT 6 07/29/2022 0805   BILITOT 0.7 07/29/2022 0805       RADIOGRAPHIC STUDIES:  CT Angio Chest PE W and/or Wo Contrast  Result Date: 07/16/2022 CLINICAL DATA:  Low oxygen saturation. EXAM: CT ANGIOGRAPHY CHEST WITH CONTRAST TECHNIQUE: Multidetector CT imaging of the chest was  performed using the standard protocol during bolus administration of intravenous contrast. Multiplanar CT image reconstructions and MIPs were obtained to evaluate the vascular anatomy. RADIATION DOSE REDUCTION: This exam was performed according to the departmental dose-optimization program which includes automated exposure control, adjustment of the mA and/or kV according to patient size and/or use of iterative reconstruction technique. CONTRAST:  71m OMNIPAQUE IOHEXOL 350 MG/ML SOLN COMPARISON:  CT scan 05/26/2022 FINDINGS: Cardiovascular: The heart is normal in size. No pericardial effusion. Stable advanced atherosclerotic calcification involving the aorta and branch vessels but no aneurysm or dissection. The pulmonary arterial tree is well opacified. No filling defects to suggest pulmonary embolism. Mediastinum/Nodes: No mediastinal or hilar mass or lymphadenopathy. The esophagus is grossly normal. Lungs/Pleura: Stable advanced emphysematous changes and pulmonary scarring. No infiltrates, edema or effusions. No worrisome pulmonary lesions or pulmonary nodules. Upper Abdomen: No significant upper abdominal findings. Stable vascular disease. Musculoskeletal: No breast masses, supraclavicular or axillary adenopathy. The bony thorax is intact. No worrisome bone lesions. Review of the MIP images confirms the above findings. IMPRESSION: 1. No CT findings for pulmonary embolism. 2. Stable advanced emphysematous changes and pulmonary scarring but no acute pulmonary findings or worrisome pulmonary lesions. Aortic Atherosclerosis (ICD10-I70.0) and Emphysema (ICD10-J43.9). Electronically Signed   By: PMarijo SanesM.D.   On: 07/16/2022 15:20   DG Chest 2 View  Result Date: 07/16/2022 CLINICAL DATA:  Dyspnea. Decreased oxygen saturations on room air. On chemotherapy. EXAM: CHEST - 2 VIEW COMPARISON:  AP chest 08/20/2021, 08/13/2021, 08/11/2021; CT chest 05/26/2022 FINDINGS: Cardiac silhouette is again mildly enlarged.  Moderate calcifications are again seen within the aortic arch. There is mildly increased bilateral diffuse interstitial thickening compared to most recent 08/20/2021 radiographs. No definite interstitial thickening or pulmonary edema was seen on 05/26/2022 CT. There are flattening of the diaphragms, hyperinflation, and chronic centrilobular emphysematous lucencies again seen within the lungs as on prior CT. Possible minimal posterior greater than right and left frontal costophrenic angle blunting, possible tiny pleural effusions. No pneumothorax. Moderate multilevel degenerative disc changes of the  thoracic spine. IMPRESSION: 1. Mildly increased bilateral diffuse interstitial thickening compared to most recent 08/20/2021 radiographs. No definite interstitial thickening or pulmonary edema was seen on 05/26/2022 CT. This could represent mild interstitial pulmonary edema or atypical infection. 2. Tiny bilateral pleural effusions versus scarring. 3. Chronic emphysematous changes. Electronically Signed   By: Yvonne Kendall M.D.   On: 07/16/2022 13:28     ASSESSMENT/PLAN:  Tina Waller is a 82 y.o. female with    1. Rectal Adenocarcinoma, stage I c(T2, N0)M0, MSS, KRAS/NRAS/BRAF wild type  -diagnosed 08/2021 by colonoscopy for black stool.  -staging CT CAP negative for metastatic disease. Baseline CEA on 08/16/21 was normal at 3.7. -she met Dr. Marcello Moores on 09/23/21. Pt declined surgery due to concern for side effects/quality of life as she lives alone. -s/p concurrent chemoradiation with Xeloda 10/21/21 - 11/28/21. Tolerated well overall. -restaging pelvis MRI 02/24/22 showed: reduction in volume of rectal carcinoma, remains stage T2 N0. -sigmoidoscopy on 04/17/22 with Dr. Rush Landmark showed 3 cm non-obstructing mass at 9-12 cm in proximal rectum. Biopsy of the mass confirmed at least intramucosal carcinoma in background of extensive high-grade dysplasia. -restaging CT CAP on 05/26/22 showed: rectal wall  thickening, not well evaluated; interval enlargement of groin lymph nodes bilaterally, Dr. Burr Medico previously discussed that this could be related to her rectal cancer.  Given her advanced age and no plan for surgery, Dr. Burr Medico do not plan to do a biopsy.  -[employer] obtained from her recent sigmoidoscopy biopsy showed MSI-stable.  KRAS, NRAS, and BRAF wild-type.   --due to residual disease after chemoRT, she started Xeloda and Vectibix on 06/09/2022. Status post 2 cycles.   -Labs were reviewed today -Recommend that she proceed today as scheduled. Her magnesium was low at 1.4. Advised to increase mag oxide to BID. She is scheduled to receive 4 g while in the infusion room today. Advised to use her clindamycin lotion on her chin if needed.  -We will see her back for a follow up in 2 weeks for evaluation and repeat blood work before starting cycle #4.      2. Anemia -secondary to GI blood loss and CKD -Overall, Hbg downtreading. Will monitor. She denies bleeding except for 5 minute nose bleed x1 yesterday. Advised to monitor and use saline spray and humidifier since oxygen may dry nasal mucosa more. Will continue to monitor.    3. Social Support -she previously lived independently and did not require assistance. -she is currently in an apartment and has support from some of her neighbors. -her son, who lives in [place], is currently in the ICU following an accident at his job. -she requests transportation assistance. We will connect her with our service coordinator.   4. Hypertension -Per chart review, it appears that the patient often has hypertension at her visits. -BP today 194/78.  The patient did not take.  She is asymptomatic.  --She recently saw her PCP on 07/23/22 -Talked to pharmacy who will obtain her home BP meds from [facility] hospital   5. Emphysema -Tx delayed on 1/3 send sent to ER for hypoxia.  -Followed up with PCP on 1/10. Ordering PFTs, portable oxygen, referral to pulmonary  medicine, started Breztri, and short course of prednisone.  -Oxygen 96 on RA today. Supposed to be getting portable oxygen tank at home. Will call [facility] for status update.   6. Decreased appetite -Scheduled to see nutritionist today -No thrush on exam. Advised to use salt water rinses and biotene  PLAN: -Labs were reviewed, recommend she proceed with cycle #3 today scheduled -Will see nutritionist in infusion room. Advised to use salt water rinse and biotene for taste changes -Increase magnesium to BID. Receiving 4 g of magnesium today -Will call [facility] to check on status of portable oxygen tank. Will monitor in infusion room -Will get her home BP meds from [facility] and administer in infusion room -Labs, follow-up visit, and Vectibix in 2 weeks on 08/12/22            No orders of the defined types were placed in this encounter.     The total time spent in the appointment was 30-39 minutes.   Telecia Larocque L Sameera Betton, PA-C 07/29/22

## 2022-07-28 ENCOUNTER — Telehealth: Payer: Self-pay

## 2022-07-28 NOTE — Telephone Encounter (Signed)
Order received for POC. I spoke to the patient and confirmed that [employer] [facility] is providing the O2 that she currently has at home. Order then faxed to [facility]. I explained to the patient that [employer] will be calling her to schedule an evaluation to make sure that she can tolerate the pulse dosing of the POC.

## 2022-07-29 ENCOUNTER — Inpatient Hospital Stay: Payer: Medicare HMO

## 2022-07-29 ENCOUNTER — Inpatient Hospital Stay: Payer: Medicare HMO | Admitting: Physician Assistant

## 2022-07-29 ENCOUNTER — Telehealth: Payer: Self-pay | Admitting: *Deleted

## 2022-07-29 ENCOUNTER — Other Ambulatory Visit: Payer: Self-pay

## 2022-07-29 ENCOUNTER — Telehealth: Payer: Self-pay

## 2022-07-29 ENCOUNTER — Inpatient Hospital Stay: Payer: Medicare HMO | Admitting: Dietician

## 2022-07-29 VITALS — BP 132/64 | HR 76 | Resp 17

## 2022-07-29 DIAGNOSIS — C20 Malignant neoplasm of rectum: Secondary | ICD-10-CM | POA: Diagnosis not present

## 2022-07-29 DIAGNOSIS — K922 Gastrointestinal hemorrhage, unspecified: Secondary | ICD-10-CM

## 2022-07-29 DIAGNOSIS — Z79899 Other long term (current) drug therapy: Secondary | ICD-10-CM | POA: Diagnosis not present

## 2022-07-29 DIAGNOSIS — Z5112 Encounter for antineoplastic immunotherapy: Secondary | ICD-10-CM | POA: Diagnosis not present

## 2022-07-29 DIAGNOSIS — I10 Essential (primary) hypertension: Secondary | ICD-10-CM

## 2022-07-29 LAB — CMP (CANCER CENTER ONLY)
ALT: 6 U/L (ref 0–44)
AST: 12 U/L — ABNORMAL LOW (ref 15–41)
Albumin: 3.8 g/dL (ref 3.5–5.0)
Alkaline Phosphatase: 105 U/L (ref 38–126)
Anion gap: 8 (ref 5–15)
BUN: 24 mg/dL — ABNORMAL HIGH (ref 8–23)
CO2: 29 mmol/L (ref 22–32)
Calcium: 9.9 mg/dL (ref 8.9–10.3)
Chloride: 105 mmol/L (ref 98–111)
Creatinine: 1.43 mg/dL — ABNORMAL HIGH (ref 0.44–1.00)
GFR, Estimated: 37 mL/min — ABNORMAL LOW (ref >60)
Glucose, Bld: 95 mg/dL (ref 70–99)
Potassium: 4 mmol/L (ref 3.5–5.1)
Sodium: 142 mmol/L (ref 135–145)
Total Bilirubin: 0.7 mg/dL (ref 0.3–1.2)
Total Protein: 7.1 g/dL (ref 6.5–8.1)

## 2022-07-29 LAB — CBC WITH DIFFERENTIAL (CANCER CENTER ONLY)
Abs Immature Granulocytes: 0.01 10*3/uL (ref 0.00–0.07)
Basophils Absolute: 0 10*3/uL (ref 0.0–0.1)
Basophils Relative: 0 %
Eosinophils Absolute: 0.1 10*3/uL (ref 0.0–0.5)
Eosinophils Relative: 4 %
HCT: 28.2 % — ABNORMAL LOW (ref 36.0–46.0)
Hemoglobin: 9.8 g/dL — ABNORMAL LOW (ref 12.0–15.0)
Immature Granulocytes: 0 %
Lymphocytes Relative: 23 %
Lymphs Abs: 0.8 10*3/uL (ref 0.7–4.0)
MCH: 33.4 pg (ref 26.0–34.0)
MCHC: 34.8 g/dL (ref 30.0–36.0)
MCV: 96.2 fL (ref 80.0–100.0)
Monocytes Absolute: 0.6 10*3/uL (ref 0.1–1.0)
Monocytes Relative: 18 %
Neutro Abs: 1.9 10*3/uL (ref 1.7–7.7)
Neutrophils Relative %: 55 %
Platelet Count: 183 10*3/uL (ref 150–400)
RBC: 2.93 MIL/uL — ABNORMAL LOW (ref 3.87–5.11)
RDW: 15.7 % — ABNORMAL HIGH (ref 11.5–15.5)
WBC Count: 3.5 10*3/uL — ABNORMAL LOW (ref 4.0–10.5)
nRBC: 0 % (ref 0.0–0.2)

## 2022-07-29 LAB — IRON AND IRON BINDING CAPACITY (CC-WL,HP ONLY)
Iron: 111 ug/dL (ref 28–170)
Saturation Ratios: 36 % — ABNORMAL HIGH (ref 10.4–31.8)
TIBC: 307 ug/dL (ref 250–450)
UIBC: 196 ug/dL (ref 148–442)

## 2022-07-29 LAB — CEA (IN HOUSE-CHCC): CEA (CHCC-In House): 3.11 ng/mL (ref 0.00–5.00)

## 2022-07-29 LAB — MAGNESIUM: Magnesium: 1.4 mg/dL — ABNORMAL LOW (ref 1.7–2.4)

## 2022-07-29 LAB — FERRITIN: Ferritin: 79 ng/mL (ref 11–307)

## 2022-07-29 MED ORDER — SODIUM CHLORIDE 0.9 % IV SOLN
5.9000 mg/kg | Freq: Once | INTRAVENOUS | Status: AC
  Administered 2022-07-29: 300 mg via INTRAVENOUS
  Filled 2022-07-29: qty 15

## 2022-07-29 MED ORDER — LOSARTAN POTASSIUM 100 MG PO TABS
100.0000 mg | ORAL_TABLET | Freq: Once | ORAL | Status: DC
  Filled 2022-07-29: qty 1

## 2022-07-29 MED ORDER — LABETALOL HCL 200 MG PO TABS
200.0000 mg | ORAL_TABLET | Freq: Once | ORAL | Status: AC
  Administered 2022-07-29: 200 mg via ORAL
  Filled 2022-07-29: qty 1

## 2022-07-29 MED ORDER — LOSARTAN POTASSIUM 50 MG PO TABS
100.0000 mg | ORAL_TABLET | Freq: Once | ORAL | Status: AC
  Administered 2022-07-29: 100 mg via ORAL
  Filled 2022-07-29: qty 2

## 2022-07-29 MED ORDER — SODIUM CHLORIDE 0.9 % IV SOLN: Freq: Once | INTRAVENOUS | Status: AC

## 2022-07-29 MED ORDER — AMLODIPINE BESYLATE 10 MG PO TABS
10.0000 mg | ORAL_TABLET | Freq: Once | ORAL | Status: AC
  Administered 2022-07-29: 10 mg via ORAL
  Filled 2022-07-29: qty 1

## 2022-07-29 MED ORDER — MAGNESIUM SULFATE 4 GM/100ML IV SOLN
4.0000 g | Freq: Once | INTRAVENOUS | Status: AC
  Administered 2022-07-29: 4 g via INTRAVENOUS
  Filled 2022-07-29: qty 100

## 2022-07-29 NOTE — Progress Notes (Signed)
Per Cassie, PA ok to proceed with mag 1.4 mg/dL, pt will receive mag in infusion today. Also ok to proceed with bp 192/76

## 2022-07-29 NOTE — Telephone Encounter (Signed)
This nurse reached out to [employer] to check the status of this patients portable oxygen tank that was ordered by her primary care physician.  This nurse was made aware that the Respiratory therapist has attempted to reach the patient to schedule delivery and was unsuccessful.  Patients contact number was verified.  This nurse was made aware that a message will be sent to have the respiratory therapist attempt to reach the patient again.  No further questions or concerns at this time.

## 2022-07-29 NOTE — Progress Notes (Signed)
  Care Coordination Note  07/29/2022 Name: SOUL DEVENEY MRN: [id number] DOB: [birth year]  Tina Waller is a 82 y.o. year old female who is a primary care patient of [name], MD and is actively engaged with the care management team. I reached out to [name] by phone today to assist with re-scheduling an initial visit with the RN Case Manager  Follow up plan: Unsuccessful telephone outreach attempt made. A HIPAA compliant phone message was left for the patient providing contact information and requesting a return call.   [place]  Direct Dial: [phone]

## 2022-07-29 NOTE — Progress Notes (Signed)
Nutrition Follow-up:  Patient with rectal cancer. She is receiving oral chemotherapy with xeloda q21d + IV vectibix q2w.   Met with patient during infusion. She reports decreased appetite due to diminished taste of foods. Patient recalls eating 2x day. Breakfast is a single egg, slice of bacon, and toast with jelly. She eats dinner around 6. Patient recalls left over roast beef with carrots and potatoes, green beans and corn bread. Some times she will snack on box of raisins, banana, or an orange mid day. Patient states that she loves water and drinking plenty. Patient has not been drinking Ensure lately.   Medications: fergon, mag-ox  Labs: BUN 24, Cr 1.43, Mg 1.4, Hgb 9.8  Anthropometrics: Wt 118 lb 12.8 oz today stable x 2 weeks  1/3 - 118 lb 4 oz 12/11 - 123 lb 1.6 oz  11/1 - 120 lb 5 oz    NUTRITION DIAGNOSIS: Severe malnutrition continues   INTERVENTION:  Encouraged pt to include protein source with all meals and snacks - snack ideas provided  Encouraged soft moist high protein foods for ease of intake - handout with soft moist high protein foods given Recommend 2 Ensure Plus/equivalent daily One complimentary case of Ensure Complete provided     MONITORING, EVALUATION, GOAL: weight trends, intake   NEXT VISIT: To be scheduled with treatment

## 2022-07-29 NOTE — Patient Instructions (Signed)
[facility]  Discharge Instructions: Thank you for choosing [facility] to provide your oncology and hematology care.   If you have a lab appointment with the [facility], please go directly to the [facility] and check in at the registration area.   Wear comfortable clothing and clothing appropriate for easy access to any Portacath or PICC line.   We strive to give you quality time with your provider. You may need to reschedule your appointment if you arrive late (15 or more minutes).  Arriving late affects you and other patients whose appointments are after yours.  Also, if you miss three or more appointments without notifying the office, you may be dismissed from the clinic at the provider's discretion.      For prescription refill requests, have your pharmacy contact our office and allow 72 hours for refills to be completed.    Today you received the following chemotherapy and/or immunotherapy agents: panitumumab      To help prevent nausea and vomiting after your treatment, we encourage you to take your nausea medication as directed.  BELOW ARE SYMPTOMS THAT SHOULD BE REPORTED IMMEDIATELY: *FEVER GREATER THAN 100.4 F (38 C) OR HIGHER *CHILLS OR SWEATING *NAUSEA AND VOMITING THAT IS NOT CONTROLLED WITH YOUR NAUSEA MEDICATION *UNUSUAL SHORTNESS OF BREATH *UNUSUAL BRUISING OR BLEEDING *URINARY PROBLEMS (pain or burning when urinating, or frequent urination) *BOWEL PROBLEMS (unusual diarrhea, constipation, pain near the anus) TENDERNESS IN MOUTH AND THROAT WITH OR WITHOUT PRESENCE OF ULCERS (sore throat, sores in mouth, or a toothache) UNUSUAL RASH, SWELLING OR PAIN  UNUSUAL VAGINAL DISCHARGE OR ITCHING   Items with * indicate a potential emergency and should be followed up as soon as possible or go to the Emergency Department if any problems should occur.  Please show the CHEMOTHERAPY ALERT CARD or IMMUNOTHERAPY ALERT CARD at check-in  to the Emergency Department and triage nurse.  Should you have questions after your visit or need to cancel or reschedule your appointment, please contact [facility]  Dept: [phone]  and follow the prompts.  Office hours are 8:00 a.m. to 4:30 p.m. Monday - Friday. Please note that voicemails left after 4:00 p.m. may not be returned until the following business day.  We are closed weekends and major holidays. You have access to a nurse at all times for urgent questions. Please call the main number to the clinic Dept: [phone] and follow the prompts.   For any non-urgent questions, you may also contact your provider using MyChart. We now offer e-Visits for anyone 8 and older to request care online for non-urgent symptoms. For details visit [email or web address].   Also download the MyChart app! Go to the app store, search "MyChart", open the app, select [facility], and log in with your MyChart username and password.

## 2022-07-31 ENCOUNTER — Other Ambulatory Visit (HOSPITAL_COMMUNITY): Payer: Self-pay

## 2022-08-01 ENCOUNTER — Telehealth: Payer: Self-pay | Admitting: Hematology

## 2022-08-01 ENCOUNTER — Encounter: Payer: Medicare HMO | Admitting: Internal Medicine

## 2022-08-01 ENCOUNTER — Telehealth: Payer: Self-pay | Admitting: Internal Medicine

## 2022-08-01 NOTE — Telephone Encounter (Signed)
Called patient regarding upcoming January appointments, patient is notified.

## 2022-08-01 NOTE — Progress Notes (Unsigned)
  Care Coordination Note  08/01/2022 Name: Tina Waller MRN: [id number] DOB: 1940/08/01  Tina Waller is a 82 y.o. year old female who is a primary care patient of [name], MD and is actively engaged with the care management team. I reached out to [name] by phone today to assist with re-scheduling a follow up visit with the RN Case Manager  Follow up plan: Unsuccessful telephone outreach attempt made. A HIPAA compliant phone message was left for the patient providing contact information and requesting a return call.   [place]  Direct Dial: [phone]

## 2022-08-04 ENCOUNTER — Other Ambulatory Visit: Payer: Self-pay | Admitting: Hematology

## 2022-08-04 ENCOUNTER — Other Ambulatory Visit (HOSPITAL_COMMUNITY): Payer: Self-pay

## 2022-08-04 ENCOUNTER — Other Ambulatory Visit: Payer: Self-pay | Admitting: Internal Medicine

## 2022-08-04 DIAGNOSIS — I10 Essential (primary) hypertension: Secondary | ICD-10-CM

## 2022-08-04 DIAGNOSIS — C20 Malignant neoplasm of rectum: Secondary | ICD-10-CM

## 2022-08-04 MED ORDER — CAPECITABINE 500 MG PO TABS
ORAL_TABLET | ORAL | 1 refills | Status: AC
  Filled 2022-08-04: qty 56, 21d supply, fill #0
  Filled 2022-08-20: qty 56, 21d supply, fill #1

## 2022-08-05 NOTE — Telephone Encounter (Signed)
Requested Prescriptions  Pending Prescriptions Disp Refills   losartan (COZAAR) 100 MG tablet [Pharmacy Med Name: LOSARTAN POTASSIUM 100 MG Tablet] 90 tablet 3    Sig: TAKE 1 TABLET EVERY DAY     Cardiovascular:  Angiotensin Receptor Blockers Failed - 08/04/2022  6:57 PM      Failed - Cr in normal range and within 180 days    Creatinine  Date Value Ref Range Status  07/29/2022 1.43 (H) 0.44 - 1.00 mg/dL Final   Creat  Date Value Ref Range Status  04/21/2016 1.35 (H) 0.60 - 0.93 mg/dL Final    Comment:      For patients > or = 80 years of age: The upper reference limit for Creatinine is approximately 13% higher for people identified as African-American.            Passed - K in normal range and within 180 days    Potassium  Date Value Ref Range Status  07/29/2022 4.0 3.5 - 5.1 mmol/L Final         Passed - Patient is not pregnant      Passed - Last BP in normal range    BP Readings from Last 1 Encounters:  07/29/22 132/64         Passed - Valid encounter within last 6 months    Recent Outpatient Visits           1 week ago Pulmonary emphysema, unspecified emphysema type [facility])   [place] [name], MD   1 month ago Essential hypertension   [place], Jarome Matin, RPH-CPP   2 months ago Essential hypertension   [place], MD   5 months ago Essential hypertension   [place], Jarome Matin, RPH-CPP   7 months ago Essential hypertension   [place], MD       Future Appointments             In 1 month Wynetta Emery, Dalbert Batman, MD [place]

## 2022-08-06 ENCOUNTER — Other Ambulatory Visit: Payer: Self-pay

## 2022-08-06 ENCOUNTER — Other Ambulatory Visit (HOSPITAL_COMMUNITY): Payer: Self-pay

## 2022-08-07 NOTE — Progress Notes (Signed)
  Care Coordination Note  08/07/2022 Name: JOELI FENNER MRN: [id number] DOB: [birth year]  Tina Waller is a 82 y.o. year old female who is a primary care patient of [name], MD and is actively engaged with the care management team. I reached out to [name] by phone today to assist with re-scheduling an initial visit with the RN Case Manager  Follow up plan: Telephone appointment with care management team member scheduled for:08/25/22  [place]: [phone]

## 2022-08-09 NOTE — Progress Notes (Signed)
[place] OFFICE PROGRESS NOTE  [name], MD [address] [place] [address]  DIAGNOSIS: Rectal cancer   Oncology History  Rectal cancer [facility])  08/15/2021 Cancer Staging   Staging form: Colon and Rectum, AJCC 8th Edition - Clinical stage from 08/15/2021: Stage I (cT2, cN0, cM0) - Signed by Truitt Merle, MD on 09/05/2021 Stage prefix: Initial diagnosis Total positive nodes: 0   08/15/2021 Procedure   Colonoscopy, Dr. Rush Landmark  Impression: - Palpable rectal mass found on digital rectal exam. - The examined portion of the ileum was normal. - Four 3 to 7 mm polyps at the recto-sigmoid colon, in the descending colon and in the ascending colon, removed with a cold snare. Resected and retrieved. - Rule out malignancy, partially obstructing tumor in the proximal/middle rectum. Biopsied. Tattooed proximal to lesion. - Diverticulosis in the entire examined colon. - Normal mucosa in the entire examined colon otherwise. - Non-bleeding non-thrombosed external and internal hemorrhoids.   08/15/2021 Initial Biopsy   FINAL MICROSCOPIC DIAGNOSIS:   A. STOMACH, BIOPSY:  - Gastric antral and oxyntic mucosa with nonspecific reactive  gastropathy  - Helicobacter pylori-like organisms are not identified on routine HE stain   B. DUODENUM, BIOPSY:  - Duodenal mucosa with no specific histopathologic changes  - Negative for increased intraepithelial lymphocytes or villous  architectural changes   C. COLON, ASCENDING, DESCENDING AND SIGMOID, POLYPECTOMY:  - Tubular adenoma(s)  - Negative for high-grade dysplasia or malignancy   D. RECTAL MASS, BIOPSY:  - Fragments of an adenoma with multifocal high-grade dysplasia, focally suspicious for intramucosal carcinoma.  See comment    COMMENT:  D.  A deeper, more severe process cannot be ruled out.  Clinical and endoscopic correlation is suggested.    08/16/2021 Imaging   EXAM: CT CHEST, ABDOMEN, AND PELVIS WITH  CONTRAST  IMPRESSION: 1. Large rectal mass. Two tiny hypodense lesions in the liver are nonspecific with regard to benign etiology or metastatic disease. 2. 9 mm in long axis hypodense pancreatic body lesion. Possibilities include postinflammatory cyst, intraductal papillary mucinous neoplasm, or less likely pancreatic adenocarcinoma. 3. Bilateral renal cysts. A complex right kidney upper pole lesion is indeterminate for complex cyst versus mass and measures 9 mm in diameter. 4. In light of these findings, consider upper abdominal MRI with contrast to further characterize the hepatic, pancreatic, and renal lesions. 5. There is also unusual left perirenal stranding especially adjacent to a cyst along the left kidney lower pole, this could be an indicator of inflammation involving the left kidney but is technically nonspecific. Some of this edema tracks along the left paracolic gutter. 6. Advanced systemic atherosclerosis. This includes aortic and coronary atherosclerosis along with mesenteric and renal atherosclerosis. If further workup of the patient's severe atherosclerotic vascular disease is warranted, CT angiography would be recommended. 7. Small bilateral pleural effusions with passive atelectasis. 8. Severe centrilobular emphysema.  Emphysema (ICD10-J43.9). 9. Mild thoracolumbar scoliosis. 10. Scattered sigmoid colon diverticula.   08/18/2021 Imaging   EXAM: MRI ABDOMEN WITHOUT AND WITH CONTRAST (INCLUDING MRCP)  IMPRESSION: 1. Study is severely limited by beam hardening artifact from a large surgical clip in the stomach, which completely obscures the previously noted pancreatic lesion on most pulse sequences rendering today's study nondiagnostic. The only visualized hepatic lesion is compatible with a small simple hepatic cyst. 2. There is abnormal signal intensity and small rim enhancing lesions in the right psoas muscle. Clinical correlation for signs and symptoms of psoas  abscess is  recommended. Alternatively, the possibility of intramuscular metastasis could be considered. 3. Small bilateral pleural effusions lying dependently with areas of presumed passive atelectasis in the lower lobes of the lungs bilaterally. 4. Multiple simple cysts in the kidneys bilaterally. 5. Aortic atherosclerosis.   08/20/2021 Initial Diagnosis   Rectal cancer ([place])   08/21/2021 Imaging   EXAM: MRI PELVIS WITHOUT CONTRAST  IMPRESSION: Rectal adenocarcinoma T stage: T2   Rectal adenocarcinoma N stage:  N0   Distance from tumor to the internal anal sphincter is 4.5 cm.   06/09/2022 -  Chemotherapy   Patient is on Treatment Plan : COLORECTAL Panitumumab q14d ([place] - Type Gene Only)       CURRENT THERAPY:  Oral chemo Xeloda day 1-14 every 21 days IV Vectibix every 2 weeks  INTERVAL HISTORY: Tina Waller 82 y.o. female returns to the clinic today for a follow-up visit alone.  The patient was last seen by myself on 07/29/2022.  At that appointment, we had discussed poor appetite and taste alterations, uncontrolled hypertension, hypomagnesemia, and followed up on the recommendations that her PCP had made regarding her low oxygen.   The patient has been following closely with the dietitian regarding her decreased appetite.  She saw them at her last appointment.  The patient was encouraged to include protein sources with her meals and snacks.  She was given a handout of high-protein foods, recommended that she take Ensure +2 times per day and she was given a complementary case of Ensure; however, she forgot to pick them up at her last appointment. Reached out to nutrition who will leave samples at the front desk to pick up on her way out today.   Her magnesium was low at her last appointment and she received IV 4 g of magnesium.  I also recommended that she increase her magnesium oxide at home to twice daily.  She has been doing this. Denies any diarrhea from increased  magnesium. Despite this, she continues to have worsening hypomagnesemia.   She also had uncontrolled hypertension at her last appointment and was unable to take her blood pressure medications prior to her appointment.  Her blood pressure is improved at this time. Her BP is 131-54 today.  Otherwise her PCP was in the process of arranging for her to receive supplemental oxygen and portable oxygen tank at home.  We had called [facility] to follow up. It turns out, they were having a hard time getting in touch with the patient.  Since her last appointment the patient was able to obtain her portable oxygen tank.  She also was given a short course of prednisone and Breztri inhalers.  She was also supposed to have an echocardiogram and PFTs and was referred to pulmonary medicine.  She is seeing pulmonary medicine on 2/7.  She is scheduled for an echo on 1/31. Overall, she thinks her breathing is improved at this time. Her oxygen is 100% on room air today. She mentions sometimes she may have mild wheezing intermittently at night. She forgot about her rescue inhaler when she has wheezing and has not been taking this. She denies any signs of infection at this time such as sore throat, nasal congestion, significant cough, or recent sick contacts.   Otherwise since last being seen she denies any other changes in her health. Denies any fever, chills, or night sweats. Denies any nausea, vomiting, diarrhea, or constipation. She only experiences diarrhea if she drinks too many protein drinks which she is not  using at this time. Denies any headache or visual changes. Denies any current rashes or skin changes except she has a mild rash on her chin. She needs a refill of her clindamycin gel.  She is here before starting cycle #4 for treatment.    MEDICAL HISTORY: Past Medical History:  Diagnosis Date   Anemia    Anxiety    Cancer ([place])    rectal   Cataract    Depression    Diabetes mellitus without complication  ([place])    pt denies   GERD (gastroesophageal reflux disease)    Gout    Headache    Hyperlipidemia    Hypertension    Oxygen deficiency    wears oxygen at noc   Stroke [facility]) 2008   09/14/2018   Tonsillar mass    Type 2 diabetes mellitus ([place]) 09/14/2018   Vertigo    Wears dentures    upper   Wears glasses     ALLERGIES:  is allergic to lisinopril, carvedilol, hydralazine, and lipitor [atorvastatin].  MEDICATIONS:  Current Outpatient Medications  Medication Sig Dispense Refill   acetaminophen (TYLENOL) 500 MG tablet Take 500-1,000 mg by mouth every 6 (six) hours as needed for moderate pain or headache.     albuterol (VENTOLIN HFA) 108 (90 Base) MCG/ACT inhaler Inhale 2 puffs into the lungs every 6 (six) hours as needed for wheezing or shortness of breath.     amLODipine (NORVASC) 10 MG tablet Take 1 tablet (10 mg total) by mouth daily. 90 tablet 1   ammonium lactate (LAC-HYDRIN) 12 % lotion Apply 1 Application topically as needed for dry skin. 400 g 5   Budeson-Glycopyrrol-Formoterol (BREZTRI AEROSPHERE) 160-9-4.8 MCG/ACT AERO Inhale 2 puffs into the lungs 2 (two) times daily. 10.7 g 11   capecitabine (XELODA) 500 MG tablet Take 2 tablets by mouth every 12 hours for 14 days, then off for 7 days. Repeat every 21 days. Take within 30 minutes after meals. 56 tablet 1   cholecalciferol (VITAMIN D3) 25 MCG (1000 UNIT) tablet Take 2 tablets (2,000 Units total) by mouth daily. (Patient taking differently: Take 10,000 Units by mouth daily.) 100 tablet 1   clindamycin (CLINDAGEL) 1 % gel Apply topically 2 (two) times daily as needed. 30 g 2   clopidogrel (PLAVIX) 75 MG tablet Take 1 tablet (75 mg total) by mouth daily. 90 tablet 1   colchicine 0.6 MG tablet Take 1 tablet (0.6 mg total) by mouth daily. 14 tablet 0   ferrous gluconate (FERGON) 324 MG tablet Take 1 tablet (324 mg total) by mouth daily with breakfast. 30 tablet 0   furosemide (LASIX) 40 MG tablet TAKE 1 TABLET TWICE DAILY 180  tablet 1   isosorbide mononitrate (IMDUR) 60 MG 24 hr tablet TAKE 1 AND 1/2 TABLETS EVERY DAY 135 tablet 3   labetalol (NORMODYNE) 200 MG tablet Take 1 tablet (200 mg total) by mouth 2 (two) times daily. 180 tablet 2   losartan (COZAAR) 100 MG tablet TAKE 1 TABLET EVERY DAY 90 tablet 1   magnesium oxide (MAG-OX) 400 (240 Mg) MG tablet Take 1 tablet (400 mg total) by mouth 2 (two) times daily. 60 tablet 1   mometasone (ELOCON) 0.1 % ointment Apply topically daily. (Patient taking differently: Apply 1 Application topically daily as needed (Eczema).) 45 g 0   Multiple Vitamin (MULTIVITAMIN WITH MINERALS) TABS tablet Take 1 tablet by mouth daily. 30 tablet 0   ondansetron (ZOFRAN) 4 MG tablet Take 1 tablet (4 mg  total) by mouth every 8 (eight) hours as needed for nausea or vomiting. 20 tablet 0   polyethylene glycol (MIRALAX) packet Take 17 g by mouth daily as needed for mild constipation.     potassium chloride (KLOR-CON M) 10 MEQ tablet Take 1 tablet (10 mEq total) by mouth 2 (two) times daily. 14 tablet 0   pravastatin (PRAVACHOL) 80 MG tablet TAKE 1 TABLET EVERY DAY FOR HIGH CHOLESTEROL 90 tablet 1   predniSONE (DELTASONE) 20 MG tablet Take 1 tablet (20 mg total) by mouth daily with breakfast. 5 tablet 0   vitamin B-12 (CYANOCOBALAMIN) 1000 MCG tablet Take 1,000 mcg by mouth daily.     No current facility-administered medications for this visit.    SURGICAL HISTORY:  Past Surgical History:  Procedure Laterality Date   ABDOMINAL HYSTERECTOMY     BIOPSY  08/15/2021   Procedure: BIOPSY;  Surgeon: Rush Landmark Telford Nab., MD;  Location: [facility];  Service: Gastroenterology;;   BIOPSY  04/17/2022   Procedure: BIOPSY;  Surgeon: Irving Copas., MD;  Location: [facility];  Service: Gastroenterology;;   BREAST SURGERY     lumpectomy   BUNIONECTOMY     CATARACT EXTRACTION Left    COLONOSCOPY     COLONOSCOPY W/ BIOPSIES AND POLYPECTOMY     COLONOSCOPY WITH PROPOFOL N/A 08/15/2021    Procedure: COLONOSCOPY WITH PROPOFOL;  Surgeon: Irving Copas., MD;  Location: [facility];  Service: Gastroenterology;  Laterality: N/A;   ESOPHAGOGASTRODUODENOSCOPY (EGD) WITH PROPOFOL N/A 08/15/2021   Procedure: ESOPHAGOGASTRODUODENOSCOPY (EGD) WITH PROPOFOL;  Surgeon: Rush Landmark Telford Nab., MD;  Location: [facility];  Service: Gastroenterology;  Laterality: N/A;   FLEXIBLE SIGMOIDOSCOPY N/A 04/17/2022   Procedure: FLEXIBLE SIGMOIDOSCOPY;  Surgeon: Rush Landmark Telford Nab., MD;  Location: [facility];  Service: Gastroenterology;  Laterality: N/A;   HEMOSTASIS CLIP PLACEMENT  08/15/2021   Procedure: HEMOSTASIS CLIP PLACEMENT;  Surgeon: Irving Copas., MD;  Location: [facility];  Service: Gastroenterology;;   HOT HEMOSTASIS N/A 08/15/2021   Procedure: HOT HEMOSTASIS (ARGON PLASMA COAGULATION/BICAP);  Surgeon: Irving Copas., MD;  Location: [facility];  Service: Gastroenterology;  Laterality: N/A;   MULTIPLE TOOTH EXTRACTIONS     POLYPECTOMY  08/15/2021   Procedure: POLYPECTOMY;  Surgeon: Mansouraty, Telford Nab., MD;  Location: [facility];  Service: Gastroenterology;;   SUBMUCOSAL TATTOO INJECTION  08/15/2021   Procedure: SUBMUCOSAL TATTOO INJECTION;  Surgeon: Irving Copas., MD;  Location: [facility];  Service: Gastroenterology;;   TONGUE BIOPSY Right 12/13/2018   Procedure: RIGHT TONSIL BIOPSY;  Surgeon: Melida Quitter, MD;  Location: [facility];  Service: ENT;  Laterality: Right;   UPPER GASTROINTESTINAL ENDOSCOPY      REVIEW OF SYSTEMS:   Constitutional: Positive for stable decreased appetite. Negative for chills, fatigue, fever and unexpected weight change.  HENT: Negative for mouth sores, nosebleeds, sore throat and trouble swallowing.   Eyes: Negative for eye problems and icterus.  Respiratory: Positive for improved shortness of breath. Positive for occasional wheezing at night. Negative for cough and hemoptysis.  Cardiovascular: Negative for  chest pain and leg swelling.  Gastrointestinal: Negative for abdominal pain, constipation, diarrhea, nausea and vomiting.  Genitourinary: Negative for bladder incontinence, difficulty urinating, dysuria, frequency and hematuria.   Musculoskeletal: Negative for back pain, gait problem, neck pain and neck stiffness.  Skin: Positive for mild rash on chin.  Neurological: Negative for dizziness, extremity weakness, gait problem, headaches, light-headedness and seizures.  Hematological: Negative for adenopathy. Does not bruise/bleed easily.  Psychiatric/Behavioral: Negative for confusion, depression and  sleep disturbance. The patient is not nervous/anxious.     PHYSICAL EXAMINATION:  Blood pressure (!) 131/54, pulse 80, temperature 97.7 F (36.5 C), temperature source Oral, resp. rate 16, weight 116 lb 12.8 oz (53 kg), SpO2 100 %.  ECOG PERFORMANCE STATUS: 1  Physical Exam  Constitutional: Oriented to person, place, and time and thin appearing female and in no distress.  HENT:  Head: Normocephalic and atraumatic.  Mouth/Throat: Oropharynx is clear and moist. No oropharyngeal exudate.  Eyes: Conjunctivae are normal. Right eye exhibits no discharge. Left eye exhibits no discharge. No scleral icterus.  Neck: Normal range of motion. Neck supple.  Cardiovascular: Normal rate, regular rhythm, normal heart sounds and intact distal pulses.   Pulmonary/Chest: Effort normal and breath sounds normal. No respiratory distress. No wheezes. No rales.  Abdominal: Soft. Bowel sounds are normal. Exhibits no distension and no mass. There is no tenderness.  Musculoskeletal: Normal range of motion. Exhibits no edema.  Lymphadenopathy:    No cervical adenopathy.  Neurological: Alert and oriented to person, place, and time. Exhibits normal muscle tone. Uses a cane for ambulation.  Skin: Skin is warm and dry. No rash noted. Not diaphoretic. No erythema. No pallor.  Psychiatric: Mood, memory and judgment normal.   Vitals reviewed.  LABORATORY DATA: Lab Results  Component Value Date   WBC 4.0 08/11/2022   HGB 9.1 (L) 08/11/2022   HCT 26.8 (L) 08/11/2022   MCV 98.5 08/11/2022   PLT 162 08/11/2022      Chemistry      Component Value Date/Time   NA 141 08/11/2022 1052   NA 142 04/19/2021 1459   K 3.9 08/11/2022 1052   CL 105 08/11/2022 1052   CO2 28 08/11/2022 1052   BUN 22 08/11/2022 1052   BUN 37 (H) 04/19/2021 1459   CREATININE 1.50 (H) 08/11/2022 1052   CREATININE 1.35 (H) 04/21/2016 1437      Component Value Date/Time   CALCIUM 9.3 08/11/2022 1052   ALKPHOS 88 08/11/2022 1052   AST 12 (L) 08/11/2022 1052   ALT 6 08/11/2022 1052   BILITOT 0.7 08/11/2022 1052       RADIOGRAPHIC STUDIES:  CT Angio Chest PE W and/or Wo Contrast  Result Date: 07/16/2022 CLINICAL DATA:  Low oxygen saturation. EXAM: CT ANGIOGRAPHY CHEST WITH CONTRAST TECHNIQUE: Multidetector CT imaging of the chest was performed using the standard protocol during bolus administration of intravenous contrast. Multiplanar CT image reconstructions and MIPs were obtained to evaluate the vascular anatomy. RADIATION DOSE REDUCTION: This exam was performed according to the departmental dose-optimization program which includes automated exposure control, adjustment of the mA and/or kV according to patient size and/or use of iterative reconstruction technique. CONTRAST:  71m OMNIPAQUE IOHEXOL 350 MG/ML SOLN COMPARISON:  CT scan 05/26/2022 FINDINGS: Cardiovascular: The heart is normal in size. No pericardial effusion. Stable advanced atherosclerotic calcification involving the aorta and branch vessels but no aneurysm or dissection. The pulmonary arterial tree is well opacified. No filling defects to suggest pulmonary embolism. Mediastinum/Nodes: No mediastinal or hilar mass or lymphadenopathy. The esophagus is grossly normal. Lungs/Pleura: Stable advanced emphysematous changes and pulmonary scarring. No infiltrates, edema or  effusions. No worrisome pulmonary lesions or pulmonary nodules. Upper Abdomen: No significant upper abdominal findings. Stable vascular disease. Musculoskeletal: No breast masses, supraclavicular or axillary adenopathy. The bony thorax is intact. No worrisome bone lesions. Review of the MIP images confirms the above findings. IMPRESSION: 1. No CT findings for pulmonary embolism. 2. Stable advanced emphysematous changes and pulmonary  scarring but no acute pulmonary findings or worrisome pulmonary lesions. Aortic Atherosclerosis (ICD10-I70.0) and Emphysema (ICD10-J43.9). Electronically Signed   By: Marijo Sanes M.D.   On: 07/16/2022 15:20   DG Chest 2 View  Result Date: 07/16/2022 CLINICAL DATA:  Dyspnea. Decreased oxygen saturations on room air. On chemotherapy. EXAM: CHEST - 2 VIEW COMPARISON:  AP chest 08/20/2021, 08/13/2021, 08/11/2021; CT chest 05/26/2022 FINDINGS: Cardiac silhouette is again mildly enlarged. Moderate calcifications are again seen within the aortic arch. There is mildly increased bilateral diffuse interstitial thickening compared to most recent 08/20/2021 radiographs. No definite interstitial thickening or pulmonary edema was seen on 05/26/2022 CT. There are flattening of the diaphragms, hyperinflation, and chronic centrilobular emphysematous lucencies again seen within the lungs as on prior CT. Possible minimal posterior greater than right and left frontal costophrenic angle blunting, possible tiny pleural effusions. No pneumothorax. Moderate multilevel degenerative disc changes of the thoracic spine. IMPRESSION: 1. Mildly increased bilateral diffuse interstitial thickening compared to most recent 08/20/2021 radiographs. No definite interstitial thickening or pulmonary edema was seen on 05/26/2022 CT. This could represent mild interstitial pulmonary edema or atypical infection. 2. Tiny bilateral pleural effusions versus scarring. 3. Chronic emphysematous changes. Electronically Signed   By:  Yvonne Kendall M.D.   On: 07/16/2022 13:28     ASSESSMENT/PLAN:  Tina Waller is a 82 y.o. female with    1. Rectal Adenocarcinoma, stage I c(T2, N0)M0, MSS, KRAS/NRAS/BRAF wild type  -diagnosed 08/2021 by colonoscopy for black stool.  -staging CT CAP negative for metastatic disease. Baseline CEA on 08/16/21 was normal at 3.7. -she met Dr. Marcello Moores on 09/23/21. Pt declined surgery due to concern for side effects/quality of life as she lives alone. -s/p concurrent chemoradiation with Xeloda 10/21/21 - 11/28/21. Tolerated well overall. -restaging pelvis MRI 02/24/22 showed: reduction in volume of rectal carcinoma, remains stage T2 N0. -sigmoidoscopy on 04/17/22 with Dr. Rush Landmark showed 3 cm non-obstructing mass at 9-12 cm in proximal rectum. Biopsy of the mass confirmed at least intramucosal carcinoma in background of extensive high-grade dysplasia. -restaging CT CAP on 05/26/22 showed: rectal wall thickening, not well evaluated; interval enlargement of groin lymph nodes bilaterally, Dr. Burr Medico previously discussed that this could be related to her rectal cancer.  Given her advanced age and no plan for surgery, Dr. Burr Medico do not plan to do a biopsy.  -[employer] obtained from her recent sigmoidoscopy biopsy showed MSI-stable.  KRAS, NRAS, and BRAF wild-type.   --due to residual disease after chemoRT, she started Xeloda and Vectibix on 06/09/2022. Status post 3 cycles.   -Labs were reviewed today.  -Recommend that she proceed today as scheduled. Her magnesium was low at 1.1. Increased her scheduled IV magnesium to 6 g today. We will also recheck this next week and arrange IV magnesium if needed. I will tentatively schedule her to receive 4 g. I have placed this under sign and hold.  -I reviewed her schedule with Dr. Burr Medico who will continue to add more cycles. The treatment does not appear to be bothering the patient except for minimal rash on her chin. I have refilled her clindamycin gel today.  -Dr. Burr Medico  recommends repeat CT of the AP in mid/late February. I have placed the order without contrast due to her CKD. She had CT chest recently in ER. Therefore, we will not order CT chest at this time.  -We will see her back for a follow up in 2 weeks for evaluation and repeat blood work before starting cycle #5  2. Anemia -secondary to GI blood loss and CKD.  -Overall, Hbg downtrending 9.1 today. She is asymptomatic. She denies bleeding at this time. Denies dark stools or bright red blood in the stool. She is compliant with her oral iron and taking daily -iron studies normal today. Ferritin pending.  -Advised to monitor for bleeding.    3. Social Support -she previously lived independently and did not require assistance. -she is currently in an apartment and has support from some of her neighbors. -her son, who lives in [place].  -she requested transportation assistance and had previously connected with [employer].    4. Hypertension -Per chart review, it appears that the patient often has hypertension at her visits. -BP today improved to 131/54  --She recently saw her PCP on 07/23/22  5. Emphysema -Tx delayed on 1/3 send sent to ER for hypoxia.  -Followed up with PCP on 1/10. Ordering PFTs, portable oxygen, referral to pulmonary medicine, started Breztri, and short course of prednisone.  -Oxygen 100 on RA today. She believes her shortness of breath is better since her last appointment.  -Seeing pulmonary medicine on 2/7. Echo scheduled on 08/13/22   6. Decreased appetite -Evaluated by nutritionist. Recommended ensure 2x per day -No thrush on exam. Advised to use salt water rinses and biotene -Weight is down 2 lbs -Reached out to nutrition. They will leave complementary box of ensures at registration desk today  7. Hypomagnesemia -Magnesium 1.1 today despite taking magnesium BID -Instructed to increase to TID if able to tolerate. If it causes loose stool, she was advised  to go back to 1-2 per day -Will increase IV magnesium in care plan to 6 g today.  -Will make labs and 2 hour infusion next week for possible additional 4 g of IV mag. I placed an order under sign and hold for next week         PLAN: -Labs were reviewed, recommend she proceed with cycle #4 today scheduled -She will pick up complementary ensure at registration desk on the way out today.  -CT AP without contrast (due to CKD) in late February -6 g IV magnesium today. Will arrange repeat labs and possible 4 g IV mag next week. Orders for IV mag under sign and hold.  -Refilled clindamycin gel and magnesium oxide. Will take magnesium TID if able to tolerate without diarrhea -Continue to take iron supplement -Obtain echo on 1/31 as recommended by PCP and establish with pulmonary medicine on 08/20/22 -Labs, follow-up visit, and Vectibix in 2 weeks   Orders Placed This Encounter  Procedures   CT Abdomen Pelvis Wo Contrast    Standing Status:   Future    Standing Expiration Date:   08/11/2023    Order Specific Question:   Preferred imaging location?    Answer:   [facility]    Order Specific Question:   Is Oral Contrast requested for this exam?    Answer:   Yes, Per Radiology protocol    Order Specific Question:   Does the patient have a contrast media/X-ray dye allergy?    Answer:   No   Magnesium    Standing Status:   Future    Standing Expiration Date:   08/26/2023   Magnesium    Standing Status:   Future    Standing Expiration Date:   09/09/2023   Magnesium    Standing Status:   Future    Standing Expiration Date:   08/11/2023     The total  time spent in the appointment was 30-39 minutes.   Jansen Goodpasture L Karys Meckley, PA-C 08/11/22

## 2022-08-11 ENCOUNTER — Inpatient Hospital Stay: Payer: Medicare HMO

## 2022-08-11 ENCOUNTER — Telehealth: Payer: Self-pay

## 2022-08-11 ENCOUNTER — Inpatient Hospital Stay: Payer: Medicare HMO | Admitting: Physician Assistant

## 2022-08-11 ENCOUNTER — Other Ambulatory Visit: Payer: Self-pay

## 2022-08-11 VITALS — BP 131/54 | HR 80 | Temp 97.7°F | Resp 16 | Wt 116.8 lb

## 2022-08-11 VITALS — BP 159/61 | HR 67 | Resp 17

## 2022-08-11 DIAGNOSIS — Z5112 Encounter for antineoplastic immunotherapy: Secondary | ICD-10-CM | POA: Diagnosis not present

## 2022-08-11 DIAGNOSIS — C20 Malignant neoplasm of rectum: Secondary | ICD-10-CM

## 2022-08-11 DIAGNOSIS — K922 Gastrointestinal hemorrhage, unspecified: Secondary | ICD-10-CM

## 2022-08-11 DIAGNOSIS — Z79899 Other long term (current) drug therapy: Secondary | ICD-10-CM | POA: Diagnosis not present

## 2022-08-11 LAB — CMP (CANCER CENTER ONLY)
ALT: 6 U/L (ref 0–44)
AST: 12 U/L — ABNORMAL LOW (ref 15–41)
Albumin: 3.7 g/dL (ref 3.5–5.0)
Alkaline Phosphatase: 88 U/L (ref 38–126)
Anion gap: 8 (ref 5–15)
BUN: 22 mg/dL (ref 8–23)
CO2: 28 mmol/L (ref 22–32)
Calcium: 9.3 mg/dL (ref 8.9–10.3)
Chloride: 105 mmol/L (ref 98–111)
Creatinine: 1.5 mg/dL — ABNORMAL HIGH (ref 0.44–1.00)
GFR, Estimated: 35 mL/min — ABNORMAL LOW (ref >60)
Glucose, Bld: 101 mg/dL — ABNORMAL HIGH (ref 70–99)
Potassium: 3.9 mmol/L (ref 3.5–5.1)
Sodium: 141 mmol/L (ref 135–145)
Total Bilirubin: 0.7 mg/dL (ref 0.3–1.2)
Total Protein: 6.6 g/dL (ref 6.5–8.1)

## 2022-08-11 LAB — CBC WITH DIFFERENTIAL (CANCER CENTER ONLY)
Abs Immature Granulocytes: 0.01 10*3/uL (ref 0.00–0.07)
Basophils Absolute: 0 10*3/uL (ref 0.0–0.1)
Basophils Relative: 1 %
Eosinophils Absolute: 0.2 10*3/uL (ref 0.0–0.5)
Eosinophils Relative: 4 %
HCT: 26.8 % — ABNORMAL LOW (ref 36.0–46.0)
Hemoglobin: 9.1 g/dL — ABNORMAL LOW (ref 12.0–15.0)
Immature Granulocytes: 0 %
Lymphocytes Relative: 25 %
Lymphs Abs: 1 10*3/uL (ref 0.7–4.0)
MCH: 33.5 pg (ref 26.0–34.0)
MCHC: 34 g/dL (ref 30.0–36.0)
MCV: 98.5 fL (ref 80.0–100.0)
Monocytes Absolute: 0.8 10*3/uL (ref 0.1–1.0)
Monocytes Relative: 19 %
Neutro Abs: 2 10*3/uL (ref 1.7–7.7)
Neutrophils Relative %: 51 %
Platelet Count: 162 10*3/uL (ref 150–400)
RBC: 2.72 MIL/uL — ABNORMAL LOW (ref 3.87–5.11)
RDW: 17.2 % — ABNORMAL HIGH (ref 11.5–15.5)
WBC Count: 4 10*3/uL (ref 4.0–10.5)
nRBC: 0 % (ref 0.0–0.2)

## 2022-08-11 LAB — IRON AND IRON BINDING CAPACITY (CC-WL,HP ONLY)
Iron: 70 ug/dL (ref 28–170)
Saturation Ratios: 23 % (ref 10.4–31.8)
TIBC: 301 ug/dL (ref 250–450)
UIBC: 231 ug/dL (ref 148–442)

## 2022-08-11 LAB — MAGNESIUM: Magnesium: 1.1 mg/dL — ABNORMAL LOW (ref 1.7–2.4)

## 2022-08-11 LAB — FERRITIN: Ferritin: 56 ng/mL (ref 11–307)

## 2022-08-11 MED ORDER — SODIUM CHLORIDE 0.9 % IV SOLN: 6.0000 g | Freq: Once | INTRAVENOUS | Status: DC

## 2022-08-11 MED ORDER — MAGNESIUM SULFATE 2 GM/50ML IV SOLN
2.0000 g | Freq: Once | INTRAVENOUS | Status: AC
  Administered 2022-08-11: 2 g via INTRAVENOUS
  Filled 2022-08-11: qty 50

## 2022-08-11 MED ORDER — SODIUM CHLORIDE 0.9 % IV SOLN
6.0000 g | Freq: Once | INTRAVENOUS | Status: DC
  Filled 2022-08-11: qty 12

## 2022-08-11 MED ORDER — SODIUM CHLORIDE 0.9 % IV SOLN
5.9000 mg/kg | Freq: Once | INTRAVENOUS | Status: AC
  Administered 2022-08-11: 300 mg via INTRAVENOUS
  Filled 2022-08-11: qty 15

## 2022-08-11 MED ORDER — MAGNESIUM OXIDE -MG SUPPLEMENT 400 (240 MG) MG PO TABS: 400.0000 mg | ORAL_TABLET | Freq: Two times a day (BID) | ORAL | 1 refills | Status: AC

## 2022-08-11 MED ORDER — CLINDAMYCIN PHOSPHATE 1 % EX GEL: Freq: Two times a day (BID) | CUTANEOUS | 2 refills | Status: AC | PRN

## 2022-08-11 MED ORDER — MAGNESIUM SULFATE 4 GM/100ML IV SOLN
4.0000 g | Freq: Once | INTRAVENOUS | Status: AC
  Administered 2022-08-11: 4 g via INTRAVENOUS
  Filled 2022-08-11: qty 100

## 2022-08-11 MED ORDER — SODIUM CHLORIDE 0.9 % IV SOLN: Freq: Once | INTRAVENOUS | Status: AC

## 2022-08-11 NOTE — Patient Instructions (Addendum)
[facility]  Discharge Instructions: Thank you for choosing [facility] to provide your oncology and hematology care.   If you have a lab appointment with the [facility], please go directly to the [facility] and check in at the registration area.   Wear comfortable clothing and clothing appropriate for easy access to any Portacath or PICC line.   We strive to give you quality time with your provider. You may need to reschedule your appointment if you arrive late (15 or more minutes).  Arriving late affects you and other patients whose appointments are after yours.  Also, if you miss three or more appointments without notifying the office, you may be dismissed from the clinic at the provider's discretion.      For prescription refill requests, have your pharmacy contact our office and allow 72 hours for refills to be completed.    Today you received the following chemotherapy and/or immunotherapy agents: Vectibix      To help prevent nausea and vomiting after your treatment, we encourage you to take your nausea medication as directed.  BELOW ARE SYMPTOMS THAT SHOULD BE REPORTED IMMEDIATELY: *FEVER GREATER THAN 100.4 F (38 C) OR HIGHER *CHILLS OR SWEATING *NAUSEA AND VOMITING THAT IS NOT CONTROLLED WITH YOUR NAUSEA MEDICATION *UNUSUAL SHORTNESS OF BREATH *UNUSUAL BRUISING OR BLEEDING *URINARY PROBLEMS (pain or burning when urinating, or frequent urination) *BOWEL PROBLEMS (unusual diarrhea, constipation, pain near the anus) TENDERNESS IN MOUTH AND THROAT WITH OR WITHOUT PRESENCE OF ULCERS (sore throat, sores in mouth, or a toothache) UNUSUAL RASH, SWELLING OR PAIN  UNUSUAL VAGINAL DISCHARGE OR ITCHING   Items with * indicate a potential emergency and should be followed up as soon as possible or go to the Emergency Department if any problems should occur.  Please show the CHEMOTHERAPY ALERT CARD or IMMUNOTHERAPY ALERT CARD at  check-in to the Emergency Department and triage nurse.  Should you have questions after your visit or need to cancel or reschedule your appointment, please contact [facility]  Dept: [phone]  and follow the prompts.  Office hours are 8:00 a.m. to 4:30 p.m. Monday - Friday. Please note that voicemails left after 4:00 p.m. may not be returned until the following business day.  We are closed weekends and major holidays. You have access to a nurse at all times for urgent questions. Please call the main number to the clinic Dept: [phone] and follow the prompts.   For any non-urgent questions, you may also contact your provider using MyChart. We now offer e-Visits for anyone 42 and older to request care online for non-urgent symptoms. For details visit [email or web address].   Also download the MyChart app! Go to the app store, search "MyChart", open the app, select [facility], and log in with your MyChart username and password.  Hypomagnesemia Hypomagnesemia is a condition in which the level of magnesium in the blood is too low. Magnesium is a mineral that is found in many foods. It is used in many different processes in the body. Hypomagnesemia can affect every organ in the body. In severe cases, it can cause life-threatening problems. What are the causes? This condition may be caused by: Not getting enough magnesium in your diet or not having enough healthy foods to eat (malnutrition). Problems with magnesium absorption in the intestines. Dehydration. Excessive use of alcohol. Vomiting. Severe or long-term (chronic) diarrhea. Some medicines, including medicines that make you urinate more  often (diuretics). Certain diseases, such as kidney disease, diabetes, celiac disease, and overactive thyroid. What are the signs or symptoms? Symptoms of this condition include: Loss of appetite, nausea, and vomiting. Involuntary shaking or trembling of a body  part (tremor). Muscle weakness or tingling in the arms and legs. Sudden tightening of muscles (muscle spasms). Confusion. Psychiatric issues, such as: Depression and irritability. Psychosis. A feeling of fluttering of the heart (palpitations). Seizures. These symptoms are more severe if magnesium levels drop suddenly. How is this diagnosed? This condition may be diagnosed based on: Your symptoms and medical history. A physical exam. Blood and urine tests. How is this treated? Treatment depends on the cause and the severity of the condition. It may be treated by: Taking a magnesium supplement. This can be taken in pill form. If the condition is severe, magnesium is usually given through an IV. Making changes to your diet. You may be directed to eat foods that have a lot of magnesium, such as green leafy vegetables, peas, beans, and nuts. Not drinking alcohol. If you are struggling not to drink, ask your health care provider for help. Follow these instructions at home: Eating and drinking     Make sure that your diet includes foods with magnesium. Foods that have a lot of magnesium in them include: Green leafy vegetables, such as spinach and broccoli. Beans and peas. Nuts and seeds, such as almonds and sunflower seeds. Whole grains, such as whole grain bread and fortified cereals. Drink fluids that contain salts and minerals (electrolytes), such as sports drinks, when you are active. Do not drink alcohol. General instructions Take over-the-counter and prescription medicines only as told by your health care provider. Take magnesium supplements as directed if your health care provider tells you to take them. Have your magnesium levels monitored as told by your health care provider. Keep all follow-up visits. This is important. Contact a health care provider if: You get worse instead of better. Your symptoms return. Get help right away if: You develop severe muscle weakness. You  have trouble breathing. You feel that your heart is racing. These symptoms may represent a serious problem that is an emergency. Do not wait to see if the symptoms will go away. Get medical help right away. Call your local emergency services (911 in the [place] Do not drive yourself to the hospital. Summary Hypomagnesemia is a condition in which the level of magnesium in the blood is too low. Hypomagnesemia can affect every organ in the body. Treatment may include eating more foods that contain magnesium, taking magnesium supplements, and not drinking alcohol. Have your magnesium levels monitored as told by your health care provider. This information is not intended to replace advice given to you by your health care provider. Make sure you discuss any questions you have with your health care provider. Document Revised: 11/27/2020 Document Reviewed: 11/27/2020 Elsevier Patient Education  [place].

## 2022-08-11 NOTE — Telephone Encounter (Signed)
-----  Message from Kizzie Bane sent at 08/11/2022  1:07 PM EST ----- Hello All,  Auth is required for Echo DOS 08/13/02  Thanks,

## 2022-08-11 NOTE — Telephone Encounter (Signed)
Tina Waller has been done and number is in appointment note

## 2022-08-13 ENCOUNTER — Ambulatory Visit (HOSPITAL_COMMUNITY): Admission: RE | Admit: 2022-08-13 | Payer: Medicare HMO | Source: Ambulatory Visit

## 2022-08-13 ENCOUNTER — Other Ambulatory Visit: Payer: Self-pay

## 2022-08-18 ENCOUNTER — Inpatient Hospital Stay: Payer: Medicare HMO

## 2022-08-19 ENCOUNTER — Other Ambulatory Visit: Payer: Self-pay

## 2022-08-19 ENCOUNTER — Ambulatory Visit: Payer: Medicare HMO | Admitting: Podiatry

## 2022-08-19 DIAGNOSIS — B351 Tinea unguium: Secondary | ICD-10-CM

## 2022-08-19 DIAGNOSIS — M79675 Pain in left toe(s): Secondary | ICD-10-CM | POA: Diagnosis not present

## 2022-08-19 DIAGNOSIS — M79674 Pain in right toe(s): Secondary | ICD-10-CM

## 2022-08-19 DIAGNOSIS — D689 Coagulation defect, unspecified: Secondary | ICD-10-CM | POA: Diagnosis not present

## 2022-08-19 DIAGNOSIS — Q828 Other specified congenital malformations of skin: Secondary | ICD-10-CM

## 2022-08-19 NOTE — Progress Notes (Unsigned)
  Subjective:  Patient ID: Tina Waller, female    DOB: [birth year],  MRN: [id number]  Tina Waller presents to clinic today for {jgcomplaint:23593}  Chief Complaint  Patient presents with   Follow-up    Patient is not a diabetic PCP-Deborah Wynetta Emery, MD/Last visit-3 weeks ago Patient is taking all medications prescribed.   New problem(s): None. {jgcomplaint:23593}  PCP is [name], MD.  Allergies  Allergen Reactions   Lisinopril Cough   Carvedilol Diarrhea   Hydralazine Diarrhea and Nausea And Vomiting   Lipitor [Atorvastatin] Diarrhea    Review of Systems: Negative except as noted in the HPI.  Objective: No changes noted in today's physical examination. Vitals:   Tina Waller is a pleasant 82 y.o. female {jgbodyhabitus:24098} AAO x 3. Vascular Examination: Capillary refill time immediate b/l. Vascular status intact b/l with palpable pedal pulses. Pedal hair present b/l. No edema. No pain with calf compression b/l. Skin temperature gradient WNL b/l.   Neurological Examination: Sensation grossly intact b/l with 10 gram monofilament. Vibratory sensation intact b/l.   Dermatological Examination: Pedal skin with normal turgor, texture and tone b/l. Toenails 1-5 b/l thick, discolored, elongated with subungual debris and pain on dorsal palpation. Pedal skin noted to be dry b/l lower extremities.  Musculoskeletal Examination: Normal muscle strength 5/5 to all lower extremity muscle groups bilaterally. HAV with bunion bilaterally and hammertoes 2-5 b/l.Marland Kitchen No pain, crepitus or joint limitation noted with ROM b/l LE.  Patient ambulates independently without assistive aids.  Radiographs: None  Assessment/Plan: No diagnosis found.  No orders of the defined types were placed in this encounter.   None {Jgplan:23602::"-Patient/POA to call should there be question/concern in the interim."}   Return in about 3 months (around 11/17/2022).  Marzetta Board,  DPM

## 2022-08-20 ENCOUNTER — Other Ambulatory Visit (HOSPITAL_COMMUNITY): Payer: Self-pay

## 2022-08-20 ENCOUNTER — Institutional Professional Consult (permissible substitution) (HOSPITAL_BASED_OUTPATIENT_CLINIC_OR_DEPARTMENT_OTHER): Payer: Medicare HMO | Admitting: Pulmonary Disease

## 2022-08-20 ENCOUNTER — Other Ambulatory Visit: Payer: Self-pay

## 2022-08-20 ENCOUNTER — Encounter: Payer: Self-pay | Admitting: Podiatry

## 2022-08-21 ENCOUNTER — Inpatient Hospital Stay: Payer: Medicare HMO

## 2022-08-21 ENCOUNTER — Encounter: Payer: Self-pay | Admitting: Hematology

## 2022-08-21 ENCOUNTER — Other Ambulatory Visit: Payer: Self-pay | Admitting: Physician Assistant

## 2022-08-21 ENCOUNTER — Inpatient Hospital Stay: Payer: Medicare HMO | Attending: Hematology

## 2022-08-21 ENCOUNTER — Other Ambulatory Visit: Payer: Self-pay

## 2022-08-21 DIAGNOSIS — C20 Malignant neoplasm of rectum: Secondary | ICD-10-CM | POA: Diagnosis not present

## 2022-08-21 DIAGNOSIS — Z79899 Other long term (current) drug therapy: Secondary | ICD-10-CM | POA: Diagnosis not present

## 2022-08-21 DIAGNOSIS — Z5112 Encounter for antineoplastic immunotherapy: Secondary | ICD-10-CM | POA: Diagnosis not present

## 2022-08-21 DIAGNOSIS — K922 Gastrointestinal hemorrhage, unspecified: Secondary | ICD-10-CM

## 2022-08-21 LAB — CBC WITH DIFFERENTIAL (CANCER CENTER ONLY)
Abs Immature Granulocytes: 0.01 10*3/uL (ref 0.00–0.07)
Basophils Absolute: 0 10*3/uL (ref 0.0–0.1)
Basophils Relative: 0 %
Eosinophils Absolute: 0.2 10*3/uL (ref 0.0–0.5)
Eosinophils Relative: 5 %
HCT: 25.5 % — ABNORMAL LOW (ref 36.0–46.0)
Hemoglobin: 8.8 g/dL — ABNORMAL LOW (ref 12.0–15.0)
Immature Granulocytes: 0 %
Lymphocytes Relative: 23 %
Lymphs Abs: 0.9 10*3/uL (ref 0.7–4.0)
MCH: 34.5 pg — ABNORMAL HIGH (ref 26.0–34.0)
MCHC: 34.5 g/dL (ref 30.0–36.0)
MCV: 100 fL (ref 80.0–100.0)
Monocytes Absolute: 0.7 10*3/uL (ref 0.1–1.0)
Monocytes Relative: 17 %
Neutro Abs: 2.2 10*3/uL (ref 1.7–7.7)
Neutrophils Relative %: 55 %
Platelet Count: 137 10*3/uL — ABNORMAL LOW (ref 150–400)
RBC: 2.55 MIL/uL — ABNORMAL LOW (ref 3.87–5.11)
RDW: 16.6 % — ABNORMAL HIGH (ref 11.5–15.5)
WBC Count: 4 10*3/uL (ref 4.0–10.5)
nRBC: 0.5 % — ABNORMAL HIGH (ref 0.0–0.2)

## 2022-08-21 LAB — CMP (CANCER CENTER ONLY)
ALT: 6 U/L (ref 0–44)
AST: 13 U/L — ABNORMAL LOW (ref 15–41)
Albumin: 3.7 g/dL (ref 3.5–5.0)
Alkaline Phosphatase: 94 U/L (ref 38–126)
Anion gap: 8 (ref 5–15)
BUN: 27 mg/dL — ABNORMAL HIGH (ref 8–23)
CO2: 28 mmol/L (ref 22–32)
Calcium: 9.5 mg/dL (ref 8.9–10.3)
Chloride: 105 mmol/L (ref 98–111)
Creatinine: 1.53 mg/dL — ABNORMAL HIGH (ref 0.44–1.00)
GFR, Estimated: 34 mL/min — ABNORMAL LOW (ref >60)
Glucose, Bld: 124 mg/dL — ABNORMAL HIGH (ref 70–99)
Potassium: 4.6 mmol/L (ref 3.5–5.1)
Sodium: 141 mmol/L (ref 135–145)
Total Bilirubin: 0.8 mg/dL (ref 0.3–1.2)
Total Protein: 6.4 g/dL — ABNORMAL LOW (ref 6.5–8.1)

## 2022-08-21 LAB — CEA (IN HOUSE-CHCC): CEA (CHCC-In House): 2.69 ng/mL (ref 0.00–5.00)

## 2022-08-21 LAB — IRON AND IRON BINDING CAPACITY (CC-WL,HP ONLY)
Iron: 115 ug/dL (ref 28–170)
Saturation Ratios: 36 % (ref 21–57)
TIBC: 322 ug/dL (ref 236–444)
UIBC: 207 ug/dL (ref 120–384)

## 2022-08-21 LAB — FERRITIN: Ferritin: 88 ng/mL (ref 11–307)

## 2022-08-21 LAB — MAGNESIUM: Magnesium: 1.1 mg/dL — ABNORMAL LOW (ref 1.7–2.4)

## 2022-08-21 MED ORDER — MAGNESIUM SULFATE 4 GM/100ML IV SOLN
4.0000 g | Freq: Once | INTRAVENOUS | Status: AC
  Administered 2022-08-21: 4 g via INTRAVENOUS
  Filled 2022-08-21: qty 100

## 2022-08-21 MED ORDER — SODIUM CHLORIDE 0.9 % IV SOLN: 6.0000 g | Freq: Once | INTRAVENOUS | Status: DC

## 2022-08-21 MED ORDER — SODIUM CHLORIDE 0.9 % IV SOLN: INTRAVENOUS | Status: DC

## 2022-08-21 MED ORDER — MAGNESIUM SULFATE 2 GM/50ML IV SOLN: 2.0000 g | Freq: Once | INTRAVENOUS | Status: DC

## 2022-08-21 NOTE — Patient Instructions (Signed)
Magnesium Sulfate Injection What is this medication? MAGNESIUM SULFATE (mag NEE zee um SUL fate) prevents and treats low levels of magnesium in your body. It may also be used to prevent and treat seizures during pregnancy in people with high blood pressure disorders, such as preeclampsia or eclampsia. Magnesium plays an important role in maintaining the health of your muscles and nervous system. This medicine may be used for other purposes; ask your health care provider or pharmacist if you have questions. What should I tell my care team before I take this medication? They need to know if you have any of these conditions: Heart disease History of irregular heart beat Kidney disease An unusual or allergic reaction to magnesium sulfate, medications, foods, dyes, or preservatives Pregnant or trying to get pregnant Breast-feeding How should I use this medication? This medication is for infusion into a vein. It is given in a hospital or clinic setting. Talk to your care team about the use of this medication in children. While this medication may be prescribed for selected conditions, precautions do apply. Overdosage: If you think you have taken too much of this medicine contact a poison control center or emergency room at once. NOTE: This medicine is only for you. Do not share this medicine with others. What if I miss a dose? This does not apply. What may interact with this medication? Certain medications for anxiety or sleep Certain medications for seizures, such phenobarbital Digoxin Medications that relax muscles for surgery Narcotic medications for pain This list may not describe all possible interactions. Give your health care provider a list of all the medicines, herbs, non-prescription drugs, or dietary supplements you use. Also tell them if you smoke, drink alcohol, or use illegal drugs. Some items may interact with your medicine. What should I watch for while using this  medication? Your condition will be monitored carefully while you are receiving this medication. You may need blood work done while you are receiving this medication. What side effects may I notice from receiving this medication? Side effects that you should report to your care team as soon as possible: Allergic reactions--skin rash, itching, hives, swelling of the face, lips, tongue, or throat High magnesium level--confusion, drowsiness, facial flushing, redness, sweating, muscle weakness, fast or irregular heartbeat, trouble breathing Low blood pressure--dizziness, feeling faint or lightheaded, blurry vision Side effects that usually do not require medical attention (report to your care team if they continue or are bothersome): Headache Nausea This list may not describe all possible side effects. Call your doctor for medical advice about side effects. You may report side effects to FDA at 1-800-FDA-1088. Where should I keep my medication? This medication is given in a hospital or clinic and will not be stored at home. NOTE: This sheet is a summary. It may not cover all possible information. If you have questions about this medicine, talk to your doctor, pharmacist, or health care provider.  2023 Elsevier/Gold Standard (2012-11-05 00:00:00)

## 2022-08-22 ENCOUNTER — Inpatient Hospital Stay: Payer: Medicare HMO | Admitting: Licensed Clinical Social Worker

## 2022-08-22 ENCOUNTER — Encounter: Payer: Self-pay | Admitting: Hematology

## 2022-08-22 DIAGNOSIS — C20 Malignant neoplasm of rectum: Secondary | ICD-10-CM

## 2022-08-22 NOTE — Progress Notes (Signed)
Patient called to inquire about [employer].  Advised patient what is needed to apply. She will provide on 08/25/22 at registration and then be given grant paperwork to complete. Advised her to contact me at earliest convenience after appointment to discuss grant expenses in detail.  She has my contact name and number to do so and will be given my card as well.

## 2022-08-22 NOTE — Progress Notes (Signed)
[facility] CSW Progress Note  Clinical Education officer, museum contacted patient by phone to discuss concerns regarding needing assistance at home.  Pt states she was put on oxygen about 3 weeks ago and since that time has found it difficult to clean her home.  Pt inquiring if there are any services which may assist her in doing that.  Pt has Family Planning Medicaid which she was recently approved for, but does not qualify for additional Medicaid services at this time.  Pt states she at one point was put on a list at the [facility] for housecleaning services and at last check was number 33 on the list.  Pt states she has not been able to reach anyone to check if she has progressed farther on the list.  CSW encouraged pt to apply for the Lita [employer] to offset her expenses to be able to pay out of pocket if needed for short term cleaning services.  Contact information for financial resource specialist given to pt.  CSW will submit an application for [employer] on behalf of pt and will reach out to [facility] to find out about wait list.      Henriette Combs, LCSW

## 2022-08-23 ENCOUNTER — Inpatient Hospital Stay: Payer: Medicare HMO

## 2022-08-23 VITALS — BP 161/70 | HR 62 | Temp 97.5°F | Resp 18

## 2022-08-23 DIAGNOSIS — C20 Malignant neoplasm of rectum: Secondary | ICD-10-CM | POA: Diagnosis not present

## 2022-08-23 DIAGNOSIS — Z79899 Other long term (current) drug therapy: Secondary | ICD-10-CM | POA: Diagnosis not present

## 2022-08-23 DIAGNOSIS — Z5112 Encounter for antineoplastic immunotherapy: Secondary | ICD-10-CM | POA: Diagnosis not present

## 2022-08-23 MED ORDER — MAGNESIUM SULFATE 2 GM/50ML IV SOLN
2.0000 g | Freq: Once | INTRAVENOUS | Status: AC
  Administered 2022-08-23: 2 g via INTRAVENOUS
  Filled 2022-08-23: qty 50

## 2022-08-23 MED ORDER — MAGNESIUM SULFATE 4 GM/100ML IV SOLN: 4.0000 g | Freq: Once | INTRAVENOUS | Status: DC

## 2022-08-23 MED ORDER — SODIUM CHLORIDE 0.9 % IV SOLN: Freq: Once | INTRAVENOUS | Status: AC

## 2022-08-23 NOTE — Patient Instructions (Signed)
Magnesium Sulfate Injection What is this medication? MAGNESIUM SULFATE (mag NEE zee um SUL fate) prevents and treats low levels of magnesium in your body. It may also be used to prevent and treat seizures during pregnancy in people with high blood pressure disorders, such as preeclampsia or eclampsia. Magnesium plays an important role in maintaining the health of your muscles and nervous system. This medicine may be used for other purposes; ask your health care provider or pharmacist if you have questions. What should I tell my care team before I take this medication? They need to know if you have any of these conditions: Heart disease History of irregular heart beat Kidney disease An unusual or allergic reaction to magnesium sulfate, medications, foods, dyes, or preservatives Pregnant or trying to get pregnant Breast-feeding How should I use this medication? This medication is for infusion into a vein. It is given in a hospital or clinic setting. Talk to your care team about the use of this medication in children. While this medication may be prescribed for selected conditions, precautions do apply. Overdosage: If you think you have taken too much of this medicine contact a poison control center or emergency room at once. NOTE: This medicine is only for you. Do not share this medicine with others. What if I miss a dose? This does not apply. What may interact with this medication? Certain medications for anxiety or sleep Certain medications for seizures, such phenobarbital Digoxin Medications that relax muscles for surgery Narcotic medications for pain This list may not describe all possible interactions. Give your health care provider a list of all the medicines, herbs, non-prescription drugs, or dietary supplements you use. Also tell them if you smoke, drink alcohol, or use illegal drugs. Some items may interact with your medicine. What should I watch for while using this  medication? Your condition will be monitored carefully while you are receiving this medication. You may need blood work done while you are receiving this medication. What side effects may I notice from receiving this medication? Side effects that you should report to your care team as soon as possible: Allergic reactions--skin rash, itching, hives, swelling of the face, lips, tongue, or throat High magnesium level--confusion, drowsiness, facial flushing, redness, sweating, muscle weakness, fast or irregular heartbeat, trouble breathing Low blood pressure--dizziness, feeling faint or lightheaded, blurry vision Side effects that usually do not require medical attention (report to your care team if they continue or are bothersome): Headache Nausea This list may not describe all possible side effects. Call your doctor for medical advice about side effects. You may report side effects to FDA at 1-800-FDA-1088. Where should I keep my medication? This medication is given in a hospital or clinic and will not be stored at home. NOTE: This sheet is a summary. It may not cover all possible information. If you have questions about this medicine, talk to your doctor, pharmacist, or health care provider.  2023 Elsevier/Gold Standard (2012-11-05 00:00:00)

## 2022-08-24 NOTE — Assessment & Plan Note (Signed)
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 -pt declined surgery  --s/p concurrent chemoradiation with Xeloda 10/21/21 - 11/28/21. Tolerated well overall.  -due to residual disease after chemoRT, she started Xeloda and Vectibix on 06/09/2022.  She is tolerating well overall.

## 2022-08-24 NOTE — Assessment & Plan Note (Signed)
-  Secondary to Vectibix -She is IV and oral mag

## 2022-08-24 NOTE — Assessment & Plan Note (Signed)
-  She was found to be hypoxic during her last visit on July 16, 2022.  She was sent to emergency room for evaluation.  CTA was negative for PE, but showed emphysema. -She has been seen by her primary care physician and was referred to pulmonary -She is on oxygen as needed.

## 2022-08-25 ENCOUNTER — Inpatient Hospital Stay: Payer: Medicare HMO

## 2022-08-25 ENCOUNTER — Other Ambulatory Visit: Payer: Self-pay

## 2022-08-25 ENCOUNTER — Ambulatory Visit: Payer: Self-pay

## 2022-08-25 ENCOUNTER — Encounter: Payer: Self-pay | Admitting: Hematology

## 2022-08-25 ENCOUNTER — Inpatient Hospital Stay (HOSPITAL_BASED_OUTPATIENT_CLINIC_OR_DEPARTMENT_OTHER): Payer: Medicare HMO | Admitting: Hematology

## 2022-08-25 VITALS — BP 173/64 | HR 82 | Temp 98.3°F | Resp 20 | Ht 69.0 in | Wt 114.0 lb

## 2022-08-25 VITALS — BP 168/74 | HR 97 | Resp 18

## 2022-08-25 DIAGNOSIS — Z79899 Other long term (current) drug therapy: Secondary | ICD-10-CM | POA: Diagnosis not present

## 2022-08-25 DIAGNOSIS — J449 Chronic obstructive pulmonary disease, unspecified: Secondary | ICD-10-CM | POA: Diagnosis not present

## 2022-08-25 DIAGNOSIS — Z5112 Encounter for antineoplastic immunotherapy: Secondary | ICD-10-CM | POA: Diagnosis not present

## 2022-08-25 DIAGNOSIS — C20 Malignant neoplasm of rectum: Secondary | ICD-10-CM

## 2022-08-25 DIAGNOSIS — K922 Gastrointestinal hemorrhage, unspecified: Secondary | ICD-10-CM

## 2022-08-25 LAB — IRON AND IRON BINDING CAPACITY (CC-WL,HP ONLY)
Iron: 75 ug/dL (ref 28–170)
Saturation Ratios: 22 % (ref 10.4–31.8)
TIBC: 337 ug/dL (ref 250–450)
UIBC: 262 ug/dL (ref 148–442)

## 2022-08-25 LAB — CMP (CANCER CENTER ONLY)
ALT: 7 U/L (ref 0–44)
AST: 14 U/L — ABNORMAL LOW (ref 15–41)
Albumin: 4 g/dL (ref 3.5–5.0)
Alkaline Phosphatase: 109 U/L (ref 38–126)
Anion gap: 7 (ref 5–15)
BUN: 25 mg/dL — ABNORMAL HIGH (ref 8–23)
CO2: 29 mmol/L (ref 22–32)
Calcium: 9.9 mg/dL (ref 8.9–10.3)
Chloride: 107 mmol/L (ref 98–111)
Creatinine: 1.59 mg/dL — ABNORMAL HIGH (ref 0.44–1.00)
GFR, Estimated: 33 mL/min — ABNORMAL LOW (ref >60)
Glucose, Bld: 106 mg/dL — ABNORMAL HIGH (ref 70–99)
Potassium: 4.4 mmol/L (ref 3.5–5.1)
Sodium: 143 mmol/L (ref 135–145)
Total Bilirubin: 0.7 mg/dL (ref 0.3–1.2)
Total Protein: 7 g/dL (ref 6.5–8.1)

## 2022-08-25 LAB — CBC WITH DIFFERENTIAL (CANCER CENTER ONLY)
Abs Immature Granulocytes: 0.01 10*3/uL (ref 0.00–0.07)
Basophils Absolute: 0 10*3/uL (ref 0.0–0.1)
Basophils Relative: 0 %
Eosinophils Absolute: 0.3 10*3/uL (ref 0.0–0.5)
Eosinophils Relative: 7 %
HCT: 27.4 % — ABNORMAL LOW (ref 36.0–46.0)
Hemoglobin: 9.1 g/dL — ABNORMAL LOW (ref 12.0–15.0)
Immature Granulocytes: 0 %
Lymphocytes Relative: 33 %
Lymphs Abs: 1.2 10*3/uL (ref 0.7–4.0)
MCH: 33.3 pg (ref 26.0–34.0)
MCHC: 33.2 g/dL (ref 30.0–36.0)
MCV: 100.4 fL — ABNORMAL HIGH (ref 80.0–100.0)
Monocytes Absolute: 0.7 10*3/uL (ref 0.1–1.0)
Monocytes Relative: 18 %
Neutro Abs: 1.5 10*3/uL — ABNORMAL LOW (ref 1.7–7.7)
Neutrophils Relative %: 42 %
Platelet Count: 180 10*3/uL (ref 150–400)
RBC: 2.73 MIL/uL — ABNORMAL LOW (ref 3.87–5.11)
RDW: 17.1 % — ABNORMAL HIGH (ref 11.5–15.5)
WBC Count: 3.5 10*3/uL — ABNORMAL LOW (ref 4.0–10.5)
nRBC: 0 % (ref 0.0–0.2)

## 2022-08-25 LAB — FERRITIN: Ferritin: 92 ng/mL (ref 11–307)

## 2022-08-25 LAB — MAGNESIUM: Magnesium: 1.8 mg/dL (ref 1.7–2.4)

## 2022-08-25 MED ORDER — MAGNESIUM SULFATE 4 GM/100ML IV SOLN
4.0000 g | Freq: Once | INTRAVENOUS | Status: AC
  Administered 2022-08-25: 4 g via INTRAVENOUS
  Filled 2022-08-25: qty 100

## 2022-08-25 MED ORDER — SODIUM CHLORIDE 0.9 % IV SOLN
300.0000 mg | Freq: Once | INTRAVENOUS | Status: AC
  Administered 2022-08-25: 300 mg via INTRAVENOUS
  Filled 2022-08-25: qty 15

## 2022-08-25 MED ORDER — SODIUM CHLORIDE 0.9 % IV SOLN: Freq: Once | INTRAVENOUS | Status: AC

## 2022-08-25 NOTE — Patient Outreach (Signed)
  Care Coordination   08/25/2022 Name: Tina Waller MRN: [id number] DOB: 08-May-1941   Care Coordination Outreach Attempts:  An unsuccessful telephone outreach was attempted for a scheduled appointment today.  Follow Up Plan:  Additional outreach attempts will be made to offer the patient care coordination information and services.   Encounter Outcome:  Pt. Request to Call Back   Care Coordination Interventions:  No, not indicated    Barb Merino, RN, BSN, CCM Care Management Coordinator [facility]  Direct Phone: [phone]

## 2022-08-25 NOTE — Progress Notes (Signed)
Patient provided income documents for [employer].  Patient approved for one-time $1000 [employer] grant to assist with personal expenses while going through treatment. She received a copy of the approval letter and expense sheet along with the Outpatient pharmacy information. She declined card today. She was advised to contact me at earliest convenience to discuss expense sheet in detail.  She has my card to do so and for any additional financial questions or concerns.

## 2022-08-25 NOTE — Patient Instructions (Signed)
[facility]  Discharge Instructions: Thank you for choosing [facility] to provide your oncology and hematology care.   If you have a lab appointment with the [facility], please go directly to the [facility] and check in at the registration area.   Wear comfortable clothing and clothing appropriate for easy access to any Portacath or PICC line.   We strive to give you quality time with your provider. You may need to reschedule your appointment if you arrive late (15 or more minutes).  Arriving late affects you and other patients whose appointments are after yours.  Also, if you miss three or more appointments without notifying the office, you may be dismissed from the clinic at the provider's discretion.      For prescription refill requests, have your pharmacy contact our office and allow 72 hours for refills to be completed.    Today you received the following chemotherapy and/or immunotherapy agents: panitumumab      To help prevent nausea and vomiting after your treatment, we encourage you to take your nausea medication as directed.  BELOW ARE SYMPTOMS THAT SHOULD BE REPORTED IMMEDIATELY: *FEVER GREATER THAN 100.4 F (38 C) OR HIGHER *CHILLS OR SWEATING *NAUSEA AND VOMITING THAT IS NOT CONTROLLED WITH YOUR NAUSEA MEDICATION *UNUSUAL SHORTNESS OF BREATH *UNUSUAL BRUISING OR BLEEDING *URINARY PROBLEMS (pain or burning when urinating, or frequent urination) *BOWEL PROBLEMS (unusual diarrhea, constipation, pain near the anus) TENDERNESS IN MOUTH AND THROAT WITH OR WITHOUT PRESENCE OF ULCERS (sore throat, sores in mouth, or a toothache) UNUSUAL RASH, SWELLING OR PAIN  UNUSUAL VAGINAL DISCHARGE OR ITCHING   Items with * indicate a potential emergency and should be followed up as soon as possible or go to the Emergency Department if any problems should occur.  Please show the CHEMOTHERAPY ALERT CARD or IMMUNOTHERAPY ALERT CARD at  check-in to the Emergency Department and triage nurse.  Should you have questions after your visit or need to cancel or reschedule your appointment, please contact [facility]  Dept: [phone]  and follow the prompts.  Office hours are 8:00 a.m. to 4:30 p.m. Monday - Friday. Please note that voicemails left after 4:00 p.m. may not be returned until the following business day.  We are closed weekends and major holidays. You have access to a nurse at all times for urgent questions. Please call the main number to the clinic Dept: [phone] and follow the prompts.   For any non-urgent questions, you may also contact your provider using MyChart. We now offer e-Visits for anyone 50 and older to request care online for non-urgent symptoms. For details visit [email or web address].   Also download the MyChart app! Go to the app store, search "MyChart", open the app, select [facility], and log in with your MyChart username and password.

## 2022-08-25 NOTE — Progress Notes (Signed)
[place]   [phone] [phone]   Clinic Follow up Note   Patient Care Team: [name], MD as PCP - General (Internal Medicine) Truitt Merle, MD as Consulting Physician (Hematology)  Date of Service:  08/25/2022  CHIEF COMPLAINT: f/u of Rectal cancer    CURRENT THERAPY:   Oral chemo Xeloda day 1-14 every 21 days IV Vectibix every 2 weeks  ASSESSMENT:  DELILIAH Waller is a 82 y.o. female with   Rectal cancer ([place]) stage I c(T2, N0)M0, MSS, KRAS/NRAS/BRAF wild type  -diagnosed 08/2021 by colonoscopy for black stool. -pt declined surgery  --s/p concurrent chemoradiation with Xeloda 10/21/21 - 11/28/21. Tolerated well overall.  -due to residual disease after chemoRT, she started Xeloda and Vectibix on 06/09/2022.  She is tolerating moderately well overall. -Due to her worsening fatigue, patient would like to take a chemo break.  She is scheduled for a restaging CT scan next week, I will call her after CT scan.  If she has had a good response to treatment, will give her a chemo break for 4 months.  COPD with hypoxia ([place]) -She was found to be hypoxic during her last visit on July 16, 2022.  She was sent to emergency room for evaluation.  CTA was negative for PE, but showed emphysema. -She has been seen by her primary care physician and was referred to pulmonary -She is on oxygen as needed.  Hypomagnesemia -Secondary to Vectibix -She is IV and oral mag     PLAN: - Orthostatics Vitals done today, she is not orthostatic  -encourage patient to increase liquid intake. -Finish current cycle Xeloda this week and do not refill -phone visit after CT next week     SUMMARY OF ONCOLOGIC HISTORY: Oncology History  Rectal cancer ([place])  08/15/2021 Cancer Staging   Staging form: Colon and Rectum, AJCC 8th Edition - Clinical stage from 08/15/2021: Stage I (cT2, cN0, cM0) - Signed by Truitt Merle, MD on 09/05/2021 Stage prefix: Initial  diagnosis Total positive nodes: 0   08/15/2021 Procedure   Colonoscopy, Dr. Rush Landmark  Impression: - Palpable rectal mass found on digital rectal exam. - The examined portion of the ileum was normal. - Four 3 to 7 mm polyps at the recto-sigmoid colon, in the descending colon and in the ascending colon, removed with a cold snare. Resected and retrieved. - Rule out malignancy, partially obstructing tumor in the proximal/middle rectum. Biopsied. Tattooed proximal to lesion. - Diverticulosis in the entire examined colon. - Normal mucosa in the entire examined colon otherwise. - Non-bleeding non-thrombosed external and internal hemorrhoids.   08/15/2021 Initial Biopsy   FINAL MICROSCOPIC DIAGNOSIS:   A. STOMACH, BIOPSY:  - Gastric antral and oxyntic mucosa with nonspecific reactive  gastropathy  - Helicobacter pylori-like organisms are not identified on routine HE stain   B. DUODENUM, BIOPSY:  - Duodenal mucosa with no specific histopathologic changes  - Negative for increased intraepithelial lymphocytes or villous  architectural changes   C. COLON, ASCENDING, DESCENDING AND SIGMOID, POLYPECTOMY:  - Tubular adenoma(s)  - Negative for high-grade dysplasia or malignancy   D. RECTAL MASS, BIOPSY:  - Fragments of an adenoma with multifocal high-grade dysplasia, focally suspicious for intramucosal carcinoma.  See comment    COMMENT:  D.  A deeper, more severe process cannot be ruled out.  Clinical and endoscopic correlation is suggested.    08/16/2021 Imaging   EXAM: CT CHEST, ABDOMEN, AND PELVIS WITH CONTRAST  IMPRESSION: 1. Large rectal mass.  Two tiny hypodense lesions in the liver are nonspecific with regard to benign etiology or metastatic disease. 2. 9 mm in long axis hypodense pancreatic body lesion. Possibilities include postinflammatory cyst, intraductal papillary mucinous neoplasm, or less likely pancreatic adenocarcinoma. 3. Bilateral renal cysts. A complex right kidney  upper pole lesion is indeterminate for complex cyst versus mass and measures 9 mm in diameter. 4. In light of these findings, consider upper abdominal MRI with contrast to further characterize the hepatic, pancreatic, and renal lesions. 5. There is also unusual left perirenal stranding especially adjacent to a cyst along the left kidney lower pole, this could be an indicator of inflammation involving the left kidney but is technically nonspecific. Some of this edema tracks along the left paracolic gutter. 6. Advanced systemic atherosclerosis. This includes aortic and coronary atherosclerosis along with mesenteric and renal atherosclerosis. If further workup of the patient's severe atherosclerotic vascular disease is warranted, CT angiography would be recommended. 7. Small bilateral pleural effusions with passive atelectasis. 8. Severe centrilobular emphysema.  Emphysema (ICD10-J43.9). 9. Mild thoracolumbar scoliosis. 10. Scattered sigmoid colon diverticula.   08/18/2021 Imaging   EXAM: MRI ABDOMEN WITHOUT AND WITH CONTRAST (INCLUDING MRCP)  IMPRESSION: 1. Study is severely limited by beam hardening artifact from a large surgical clip in the stomach, which completely obscures the previously noted pancreatic lesion on most pulse sequences rendering today's study nondiagnostic. The only visualized hepatic lesion is compatible with a small simple hepatic cyst. 2. There is abnormal signal intensity and small rim enhancing lesions in the right psoas muscle. Clinical correlation for signs and symptoms of psoas abscess is recommended. Alternatively, the possibility of intramuscular metastasis could be considered. 3. Small bilateral pleural effusions lying dependently with areas of presumed passive atelectasis in the lower lobes of the lungs bilaterally. 4. Multiple simple cysts in the kidneys bilaterally. 5. Aortic atherosclerosis.   08/20/2021 Initial Diagnosis   Rectal cancer ([place])    08/21/2021 Imaging   EXAM: MRI PELVIS WITHOUT CONTRAST  IMPRESSION: Rectal adenocarcinoma T stage: T2   Rectal adenocarcinoma N stage:  N0   Distance from tumor to the internal anal sphincter is 4.5 cm.   06/09/2022 -  Chemotherapy   Patient is on Treatment Plan : COLORECTAL Panitumumab q14d ([place] - Type Gene Only)        INTERVAL HISTORY:  LADAIJAH BRAGGS is here for a follow up of Rectal Cancer She was last seen by PA-C Cassie on 08/11/2022 She presents to the clinic alone. Pt states she had said chest discomfort last week when she was laying down. She states it felt like acid reflux. Pt states she has been feeling dizzy  when she stands up. She did state that she has not taking her morning meds. Pt state she drinks two Ensure a day.   All other systems were reviewed with the patient and are negative.  MEDICAL HISTORY:  Past Medical History:  Diagnosis Date   Anemia    Anxiety    Cancer ([place])    rectal   Cataract    Depression    Diabetes mellitus without complication ([place])    pt denies   GERD (gastroesophageal reflux disease)    Gout    Headache    Hyperlipidemia    Hypertension    Oxygen deficiency    wears oxygen at noc   Stroke [facility]) 2008   09/14/2018   Tonsillar mass    Type 2 diabetes mellitus ([place]) 09/14/2018   Vertigo  Wears dentures    upper   Wears glasses     SURGICAL HISTORY: Past Surgical History:  Procedure Laterality Date   ABDOMINAL HYSTERECTOMY     BIOPSY  08/15/2021   Procedure: BIOPSY;  Surgeon: Rush Landmark Telford Nab., MD;  Location: [facility];  Service: Gastroenterology;;   BIOPSY  04/17/2022   Procedure: BIOPSY;  Surgeon: Irving Copas., MD;  Location: [facility];  Service: Gastroenterology;;   BREAST SURGERY     lumpectomy   BUNIONECTOMY     CATARACT EXTRACTION Left    COLONOSCOPY     COLONOSCOPY W/ BIOPSIES AND POLYPECTOMY     COLONOSCOPY WITH PROPOFOL N/A 08/15/2021   Procedure: COLONOSCOPY WITH PROPOFOL;   Surgeon: Irving Copas., MD;  Location: [place];  Service: Gastroenterology;  Laterality: N/A;   ESOPHAGOGASTRODUODENOSCOPY (EGD) WITH PROPOFOL N/A 08/15/2021   Procedure: ESOPHAGOGASTRODUODENOSCOPY (EGD) WITH PROPOFOL;  Surgeon: Rush Landmark Telford Nab., MD;  Location: [place];  Service: Gastroenterology;  Laterality: N/A;   FLEXIBLE SIGMOIDOSCOPY N/A 04/17/2022   Procedure: FLEXIBLE SIGMOIDOSCOPY;  Surgeon: Rush Landmark Telford Nab., MD;  Location: [facility];  Service: Gastroenterology;  Laterality: N/A;   HEMOSTASIS CLIP PLACEMENT  08/15/2021   Procedure: HEMOSTASIS CLIP PLACEMENT;  Surgeon: Irving Copas., MD;  Location: [place];  Service: Gastroenterology;;   HOT HEMOSTASIS N/A 08/15/2021   Procedure: HOT HEMOSTASIS (ARGON PLASMA COAGULATION/BICAP);  Surgeon: Irving Copas., MD;  Location: [place];  Service: Gastroenterology;  Laterality: N/A;   MULTIPLE TOOTH EXTRACTIONS     POLYPECTOMY  08/15/2021   Procedure: POLYPECTOMY;  Surgeon: Mansouraty, Telford Nab., MD;  Location: [place];  Service: Gastroenterology;;   SUBMUCOSAL TATTOO INJECTION  08/15/2021   Procedure: SUBMUCOSAL TATTOO INJECTION;  Surgeon: Irving Copas., MD;  Location: [place];  Service: Gastroenterology;;   TONGUE BIOPSY Right 12/13/2018   Procedure: RIGHT TONSIL BIOPSY;  Surgeon: Melida Quitter, MD;  Location: [facility];  Service: ENT;  Laterality: Right;   UPPER GASTROINTESTINAL ENDOSCOPY      I have reviewed the social history and family history with the patient and they are unchanged from previous note.  ALLERGIES:  is allergic to lisinopril, carvedilol, hydralazine, and lipitor [atorvastatin].  MEDICATIONS:  Current Outpatient Medications  Medication Sig Dispense Refill   acetaminophen (TYLENOL) 500 MG tablet Take 500-1,000 mg by mouth every 6 (six) hours as needed for moderate pain or headache.     albuterol (VENTOLIN HFA) 108 (90 Base) MCG/ACT inhaler  Inhale 2 puffs into the lungs every 6 (six) hours as needed for wheezing or shortness of breath.     amLODipine (NORVASC) 10 MG tablet Take 1 tablet (10 mg total) by mouth daily. 90 tablet 1   ammonium lactate (LAC-HYDRIN) 12 % lotion Apply 1 Application topically as needed for dry skin. 400 g 5   Budeson-Glycopyrrol-Formoterol (BREZTRI AEROSPHERE) 160-9-4.8 MCG/ACT AERO Inhale 2 puffs into the lungs 2 (two) times daily. 10.7 g 11   capecitabine (XELODA) 500 MG tablet Take 2 tablets by mouth every 12 hours for 14 days, then off for 7 days. Repeat every 21 days. Take within 30 minutes after meals. 56 tablet 1   cholecalciferol (VITAMIN D3) 25 MCG (1000 UNIT) tablet Take 2 tablets (2,000 Units total) by mouth daily. (Patient taking differently: Take 10,000 Units by mouth daily.) 100 tablet 1   clindamycin (CLINDAGEL) 1 % gel Apply topically 2 (two) times daily as needed. 30 g 2   clopidogrel (PLAVIX) 75 MG tablet Take 1 tablet (75 mg total) by  mouth daily. 90 tablet 1   colchicine 0.6 MG tablet Take 1 tablet (0.6 mg total) by mouth daily. 14 tablet 0   ferrous gluconate (FERGON) 324 MG tablet Take 1 tablet (324 mg total) by mouth daily with breakfast. 30 tablet 0   furosemide (LASIX) 40 MG tablet TAKE 1 TABLET TWICE DAILY 180 tablet 1   isosorbide mononitrate (IMDUR) 60 MG 24 hr tablet TAKE 1 AND 1/2 TABLETS EVERY DAY 135 tablet 3   labetalol (NORMODYNE) 200 MG tablet Take 1 tablet (200 mg total) by mouth 2 (two) times daily. 180 tablet 2   losartan (COZAAR) 100 MG tablet TAKE 1 TABLET EVERY DAY 90 tablet 1   magnesium oxide (MAG-OX) 400 (240 Mg) MG tablet Take 1 tablet (400 mg total) by mouth 2 (two) times daily. 60 tablet 1   mometasone (ELOCON) 0.1 % ointment Apply topically daily. (Patient taking differently: Apply 1 Application topically daily as needed (Eczema).) 45 g 0   Multiple Vitamin (MULTIVITAMIN WITH MINERALS) TABS tablet Take 1 tablet by mouth daily. 30 tablet 0   ondansetron (ZOFRAN) 4  MG tablet Take 1 tablet (4 mg total) by mouth every 8 (eight) hours as needed for nausea or vomiting. 20 tablet 0   polyethylene glycol (MIRALAX) packet Take 17 g by mouth daily as needed for mild constipation.     potassium chloride (KLOR-CON M) 10 MEQ tablet Take 1 tablet (10 mEq total) by mouth 2 (two) times daily. 14 tablet 0   pravastatin (PRAVACHOL) 80 MG tablet TAKE 1 TABLET EVERY DAY FOR HIGH CHOLESTEROL 90 tablet 1   predniSONE (DELTASONE) 20 MG tablet Take 1 tablet (20 mg total) by mouth daily with breakfast. 5 tablet 0   vitamin B-12 (CYANOCOBALAMIN) 1000 MCG tablet Take 1,000 mcg by mouth daily.     No current facility-administered medications for this visit.    PHYSICAL EXAMINATION: ECOG PERFORMANCE STATUS: 2 - Symptomatic, <50% confined to bed  Vitals:   08/25/22 0949 08/25/22 0951  BP: (!) 159/72 (!) 173/64  Pulse:    Resp:    Temp:    SpO2:     Wt Readings from Last 3 Encounters:  08/25/22 114 lb (51.7 kg)  08/11/22 116 lb 12.8 oz (53 kg)  07/29/22 118 lb 12.8 oz (53.9 kg)      SKIN: skin color dark hands, texture, turgor are normal, no rashes or significant lesions EYES: normal, Conjunctiva are pink and non-injected, sclera clear LUNGS: (-) clear to auscultation and percussion with normal breathing effort HEART: (-) regular rate & rhythm and no murmurs and no lower extremity edema   LABORATORY DATA:  I have reviewed the data as listed    Latest Ref Rng & Units 08/25/2022    8:40 AM 08/21/2022    1:17 PM 08/11/2022   10:52 AM  CBC  WBC 4.0 - 10.5 K/uL 3.5  4.0  4.0   Hemoglobin 12.0 - 15.0 g/dL 9.1  8.8  9.1   Hematocrit 36.0 - 46.0 % 27.4  25.5  26.8   Platelets 150 - 400 K/uL 180  137  162         Latest Ref Rng & Units 08/25/2022    8:40 AM 08/21/2022    1:17 PM 08/11/2022   10:52 AM  CMP  Glucose 70 - 99 mg/dL 106  124  101   BUN 8 - 23 mg/dL 25  27  22   $ Creatinine 0.44 - 1.00 mg/dL 1.59  1.53  1.50   Sodium 135 - 145 mmol/L 143  141  141    Potassium 3.5 - 5.1 mmol/L 4.4  4.6  3.9   Chloride 98 - 111 mmol/L 107  105  105   CO2 22 - 32 mmol/L 29  28  28   $ Calcium 8.9 - 10.3 mg/dL 9.9  9.5  9.3   Total Protein 6.5 - 8.1 g/dL 7.0  6.4  6.6   Total Bilirubin 0.3 - 1.2 mg/dL 0.7  0.8  0.7   Alkaline Phos 38 - 126 U/L 109  94  88   AST 15 - 41 U/L 14  13  12   $ ALT 0 - 44 U/L 7  6  6       $ RADIOGRAPHIC STUDIES: I have personally reviewed the radiological images as listed and agreed with the findings in the report. No results found.    No orders of the defined types were placed in this encounter.  All questions were answered. The patient knows to call the clinic with any problems, questions or concerns. No barriers to learning was detected. The total time spent in the appointment was 30 minutes.     Truitt Merle, MD 08/25/2022   Felicity Coyer, CMA, am acting as scribe for Truitt Merle, MD.   I have reviewed the above documentation for accuracy and completeness, and I agree with the above.

## 2022-08-26 ENCOUNTER — Inpatient Hospital Stay: Payer: Medicare HMO | Admitting: Licensed Clinical Social Worker

## 2022-08-26 ENCOUNTER — Other Ambulatory Visit (HOSPITAL_COMMUNITY): Payer: Self-pay

## 2022-08-26 ENCOUNTER — Other Ambulatory Visit: Payer: Self-pay

## 2022-08-26 DIAGNOSIS — C20 Malignant neoplasm of rectum: Secondary | ICD-10-CM

## 2022-08-26 NOTE — Progress Notes (Signed)
[facility] CSW Progress Note  Clinical Education officer, museum  received confirmation from [employer] that pt has been accepted and approved for two cleanings within the next two months.  CSW called pt to inform and provided the contact information for the cleaning company ([employer] [phone]).  CSW also researched pt's question regarding being on a wait list through [facility].  Pt's case manager through [facility] applied pt for this program several month ago.  CSW instructed pt to contact her [employer] case manager to ask about an update regarding this program.        Henriette Combs, LCSW

## 2022-08-28 ENCOUNTER — Ambulatory Visit (HOSPITAL_COMMUNITY)
Admission: RE | Admit: 2022-08-28 | Discharge: 2022-08-28 | Disposition: A | Discharge status: Home / Self Care | Payer: Medicare HMO | Source: Ambulatory Visit | Attending: Internal Medicine | Admitting: Internal Medicine

## 2022-08-28 DIAGNOSIS — E119 Type 2 diabetes mellitus without complications: Secondary | ICD-10-CM | POA: Diagnosis not present

## 2022-08-28 DIAGNOSIS — Z8673 Personal history of transient ischemic attack (TIA), and cerebral infarction without residual deficits: Secondary | ICD-10-CM | POA: Insufficient documentation

## 2022-08-28 DIAGNOSIS — I10 Essential (primary) hypertension: Secondary | ICD-10-CM | POA: Diagnosis not present

## 2022-08-28 DIAGNOSIS — R0609 Other forms of dyspnea: Secondary | ICD-10-CM | POA: Insufficient documentation

## 2022-08-28 DIAGNOSIS — K219 Gastro-esophageal reflux disease without esophagitis: Secondary | ICD-10-CM | POA: Insufficient documentation

## 2022-08-28 DIAGNOSIS — E785 Hyperlipidemia, unspecified: Secondary | ICD-10-CM | POA: Diagnosis not present

## 2022-08-28 LAB — ECHOCARDIOGRAM COMPLETE
Area-P 1/2: 5.66 cm2
Calc EF: 59.9 %
S' Lateral: 2.3 cm
Single Plane A2C EF: 57.9 %
Single Plane A4C EF: 60.5 %

## 2022-08-28 NOTE — Progress Notes (Signed)
Echocardiogram 2D Echocardiogram has been performed.  Fidel Levy 08/28/2022, 3:01 PM

## 2022-09-01 ENCOUNTER — Telehealth: Payer: Self-pay

## 2022-09-01 NOTE — Telephone Encounter (Signed)
I spoke to [facility] and she confirmed that the order for the POC was received and the patient had a POC evaluation done 08/14/2022.  There was no note indicating if she has since received the POC.

## 2022-09-02 ENCOUNTER — Telehealth: Payer: Self-pay | Admitting: Internal Medicine

## 2022-09-02 DIAGNOSIS — J439 Emphysema, unspecified: Secondary | ICD-10-CM

## 2022-09-02 DIAGNOSIS — J9611 Chronic respiratory failure with hypoxia: Secondary | ICD-10-CM

## 2022-09-02 NOTE — Progress Notes (Signed)
Rescheduled 09/10/22  [place]  Direct Dial: [phone]

## 2022-09-02 NOTE — Telephone Encounter (Signed)
-----   Message from Ena Dawley sent at 09/01/2022  4:38 PM EST ----- Regarding: RE: Pulmonary Referral Good Afternoon  They  CONTACT PATIENT  ON  1/17   Leave a message  and is anothet message   patient declined   Can you please, place a new referral  thank you  .   ----- Message ----- From: [name], MD Sent: 08/28/2022  11:55 PM EST To: Ena Dawley Subject: Pulmonary Referral                             Please try to get her in with Pulmonary ASAP.

## 2022-09-03 ENCOUNTER — Ambulatory Visit (HOSPITAL_COMMUNITY)
Admission: RE | Admit: 2022-09-03 | Discharge: 2022-09-03 | Disposition: A | Discharge status: Home / Self Care | Payer: Medicare HMO | Source: Ambulatory Visit | Attending: Physician Assistant | Admitting: Physician Assistant

## 2022-09-03 ENCOUNTER — Encounter (HOSPITAL_COMMUNITY): Payer: Self-pay

## 2022-09-03 DIAGNOSIS — C20 Malignant neoplasm of rectum: Secondary | ICD-10-CM | POA: Insufficient documentation

## 2022-09-03 DIAGNOSIS — N200 Calculus of kidney: Secondary | ICD-10-CM | POA: Diagnosis not present

## 2022-09-03 DIAGNOSIS — N281 Cyst of kidney, acquired: Secondary | ICD-10-CM | POA: Diagnosis not present

## 2022-09-03 MED ORDER — IOHEXOL 9 MG/ML PO SOLN
500.0000 mL | ORAL | Status: AC
  Administered 2022-09-03 (×2): 500 mL via ORAL

## 2022-09-03 MED ORDER — IOHEXOL 9 MG/ML PO SOLN
ORAL | Status: AC
  Filled 2022-09-03: qty 1000

## 2022-09-03 NOTE — Telephone Encounter (Signed)
Called LVM to call back

## 2022-09-03 NOTE — Progress Notes (Unsigned)
Tina Waller   [phone] [phone]   Clinic Follow up Note   Patient Care Team: [name], MD as PCP - General (Internal Medicine) Truitt Merle, MD as Consulting Physician (Hematology)  Date of Service:  09/04/2022  I connected with Tina Waller on 09/04/2022 at  4:00 PM EST by telephone visit and verified that I am speaking with the correct person using two identifiers.  I discussed the limitations, risks, security and privacy concerns of performing an evaluation and management service by telephone and the availability of in person appointments. I also discussed with the patient that there may be a patient responsible charge related to this service. The patient expressed understanding and agreed to proceed.   Other persons participating in the visit and their role in the encounter:    Patient's location:  Home Provider's location:  Office  CHIEF COMPLAINT: f/u of Rectal cancer     CURRENT THERAPY:  Oral chemo Xeloda day 1-14 every 21 days IV Vectibix every 2 weeks  ASSESSMENT & PLAN:  Tina Waller is a 82 y.o. female with   Rectal cancer ([place]) stage I c(T2, N0)M0, MSS, KRAS/NRAS/BRAF wild type  -diagnosed 08/2021 by colonoscopy for black stool. -pt declined surgery  --s/p concurrent chemoradiation with Xeloda 10/21/21 - 11/28/21. Tolerated well overall.  -due to residual disease after chemoRT, she started Xeloda and Vectibix on 06/09/2022.  She is tolerating moderately well overall. -Due to her worsening fatigue, patient would like to take a chemo break.   -CT scan from yesterday showed stable rectal cancer, no evidence of nodal or distant metastasis.  I personally reviewed the scan images and discussed the findings with patient.  She is quite pleased. -Given her significant fatigue, limited social support, we decided to proceed with chemo break for a month   COPD with hypoxia ([place]) -She was found to be hypoxic during her last  visit on July 16, 2022.  She was sent to emergency room for evaluation.  CTA was negative for PE, but showed emphysema. -She has been seen by her primary care physician and was referred to pulmonary -She is on oxygen as needed.  Hypomagnesemia -Secondary to Vectibix -She is IV and oral mag   PLAN: -discuss CT Scan - SD, no progression -Recommend MRI in 3 months as next staging scan  -Give chemo break  for about a Month to recover -Deferred next chemo to 10/03/2022  SUMMARY OF ONCOLOGIC HISTORY: Oncology History Overview Note   Cancer Staging  Rectal cancer [facility]) Staging form: Colon and Rectum, AJCC 8th Edition - Clinical stage from 08/15/2021: Stage I (cT2, cN0, cM0) - Signed by Truitt Merle, MD on 09/05/2021 Stage prefix: Initial diagnosis Total positive nodes: 0     Rectal cancer ([place])  08/15/2021 Cancer Staging   Staging form: Colon and Rectum, AJCC 8th Edition - Clinical stage from 08/15/2021: Stage I (cT2, cN0, cM0) - Signed by Truitt Merle, MD on 09/05/2021 Stage prefix: Initial diagnosis Total positive nodes: 0   08/15/2021 Procedure   Colonoscopy, Dr. Rush Landmark  Impression: - Palpable rectal mass found on digital rectal exam. - The examined portion of the ileum was normal. - Four 3 to 7 mm polyps at the recto-sigmoid colon, in the descending colon and in the ascending colon, removed with a cold snare. Resected and retrieved. - Rule out malignancy, partially obstructing tumor in the proximal/middle rectum. Biopsied. Tattooed proximal to lesion. - Diverticulosis in the entire examined colon. - Normal mucosa  in the entire examined colon otherwise. - Non-bleeding non-thrombosed external and internal hemorrhoids.   08/15/2021 Initial Biopsy   FINAL MICROSCOPIC DIAGNOSIS:   A. STOMACH, BIOPSY:  - Gastric antral and oxyntic mucosa with nonspecific reactive  gastropathy  - Helicobacter pylori-like organisms are not identified on routine HE stain   B. DUODENUM, BIOPSY:  - Duodenal  mucosa with no specific histopathologic changes  - Negative for increased intraepithelial lymphocytes or villous  architectural changes   C. COLON, ASCENDING, DESCENDING AND SIGMOID, POLYPECTOMY:  - Tubular adenoma(s)  - Negative for high-grade dysplasia or malignancy   D. RECTAL MASS, BIOPSY:  - Fragments of an adenoma with multifocal high-grade dysplasia, focally suspicious for intramucosal carcinoma.  See comment    COMMENT:  D.  A deeper, more severe process cannot be ruled out.  Clinical and endoscopic correlation is suggested.    08/16/2021 Imaging   EXAM: CT CHEST, ABDOMEN, AND PELVIS WITH CONTRAST  IMPRESSION: 1. Large rectal mass. Two tiny hypodense lesions in the liver are nonspecific with regard to benign etiology or metastatic disease. 2. 9 mm in long axis hypodense pancreatic body lesion. Possibilities include postinflammatory cyst, intraductal papillary mucinous neoplasm, or less likely pancreatic adenocarcinoma. 3. Bilateral renal cysts. A complex right kidney upper pole lesion is indeterminate for complex cyst versus mass and measures 9 mm in diameter. 4. In light of these findings, consider upper abdominal MRI with contrast to further characterize the hepatic, pancreatic, and renal lesions. 5. There is also unusual left perirenal stranding especially adjacent to a cyst along the left kidney lower pole, this could be an indicator of inflammation involving the left kidney but is technically nonspecific. Some of this edema tracks along the left paracolic gutter. 6. Advanced systemic atherosclerosis. This includes aortic and coronary atherosclerosis along with mesenteric and renal atherosclerosis. If further workup of the patient's severe atherosclerotic vascular disease is warranted, CT angiography would be recommended. 7. Small bilateral pleural effusions with passive atelectasis. 8. Severe centrilobular emphysema.  Emphysema (ICD10-J43.9). 9. Mild thoracolumbar  scoliosis. 10. Scattered sigmoid colon diverticula.   08/18/2021 Imaging   EXAM: MRI ABDOMEN WITHOUT AND WITH CONTRAST (INCLUDING MRCP)  IMPRESSION: 1. Study is severely limited by beam hardening artifact from a large surgical clip in the stomach, which completely obscures the previously noted pancreatic lesion on most pulse sequences rendering today's study nondiagnostic. The only visualized hepatic lesion is compatible with a small simple hepatic cyst. 2. There is abnormal signal intensity and small rim enhancing lesions in the right psoas muscle. Clinical correlation for signs and symptoms of psoas abscess is recommended. Alternatively, the possibility of intramuscular metastasis could be considered. 3. Small bilateral pleural effusions lying dependently with areas of presumed passive atelectasis in the lower lobes of the lungs bilaterally. 4. Multiple simple cysts in the kidneys bilaterally. 5. Aortic atherosclerosis.   08/20/2021 Initial Diagnosis   Rectal cancer ([place])   08/21/2021 Imaging   EXAM: MRI PELVIS WITHOUT CONTRAST  IMPRESSION: Rectal adenocarcinoma T stage: T2   Rectal adenocarcinoma N stage:  N0   Distance from tumor to the internal anal sphincter is 4.5 cm.   06/09/2022 -  Chemotherapy   Patient is on Treatment Plan : COLORECTAL Panitumumab q14d ([place] - Type Gene Only)        INTERVAL HISTORY:  Tina Waller was contacted for a follow up of Rectal cancer   . She was last seen by me on 08/25/2022. Pt reports there is nothing new. Pt state she  is fatigue and SOB after treatment. Pt states it is about the same and she is able to take care of herself. Pt denies having pain but stated she had a nose bleed and it did not last long.   All other systems were reviewed with the patient and are negative.  MEDICAL HISTORY:  Past Medical History:  Diagnosis Date   Anemia    Anxiety    Cancer ([place])    rectal   Cataract    Depression    Diabetes mellitus  without complication ([place])    pt denies   GERD (gastroesophageal reflux disease)    Gout    Headache    Hyperlipidemia    Hypertension    Oxygen deficiency    wears oxygen at noc   Stroke [facility]) 2008   09/14/2018   Tonsillar mass    Type 2 diabetes mellitus ([place]) 09/14/2018   Vertigo    Wears dentures    upper   Wears glasses     SURGICAL HISTORY: Past Surgical History:  Procedure Laterality Date   ABDOMINAL HYSTERECTOMY     BIOPSY  08/15/2021   Procedure: BIOPSY;  Surgeon: Irving Copas., MD;  Location: [place];  Service: Gastroenterology;;   BIOPSY  04/17/2022   Procedure: BIOPSY;  Surgeon: Irving Copas., MD;  Location: [facility];  Service: Gastroenterology;;   BREAST SURGERY     lumpectomy   BUNIONECTOMY     CATARACT EXTRACTION Left    COLONOSCOPY     COLONOSCOPY W/ BIOPSIES AND POLYPECTOMY     COLONOSCOPY WITH PROPOFOL N/A 08/15/2021   Procedure: COLONOSCOPY WITH PROPOFOL;  Surgeon: Irving Copas., MD;  Location: [place];  Service: Gastroenterology;  Laterality: N/A;   ESOPHAGOGASTRODUODENOSCOPY (EGD) WITH PROPOFOL N/A 08/15/2021   Procedure: ESOPHAGOGASTRODUODENOSCOPY (EGD) WITH PROPOFOL;  Surgeon: Rush Landmark Telford Nab., MD;  Location: [place];  Service: Gastroenterology;  Laterality: N/A;   FLEXIBLE SIGMOIDOSCOPY N/A 04/17/2022   Procedure: FLEXIBLE SIGMOIDOSCOPY;  Surgeon: Rush Landmark Telford Nab., MD;  Location: [facility];  Service: Gastroenterology;  Laterality: N/A;   HEMOSTASIS CLIP PLACEMENT  08/15/2021   Procedure: HEMOSTASIS CLIP PLACEMENT;  Surgeon: Irving Copas., MD;  Location: [place];  Service: Gastroenterology;;   HOT HEMOSTASIS N/A 08/15/2021   Procedure: HOT HEMOSTASIS (ARGON PLASMA COAGULATION/BICAP);  Surgeon: Irving Copas., MD;  Location: [place];  Service: Gastroenterology;  Laterality: N/A;   MULTIPLE TOOTH EXTRACTIONS     POLYPECTOMY  08/15/2021   Procedure: POLYPECTOMY;   Surgeon: Mansouraty, Telford Nab., MD;  Location: [place];  Service: Gastroenterology;;   SUBMUCOSAL TATTOO INJECTION  08/15/2021   Procedure: SUBMUCOSAL TATTOO INJECTION;  Surgeon: Irving Copas., MD;  Location: [place];  Service: Gastroenterology;;   TONGUE BIOPSY Right 12/13/2018   Procedure: RIGHT TONSIL BIOPSY;  Surgeon: Melida Quitter, MD;  Location: [place];  Service: ENT;  Laterality: Right;   UPPER GASTROINTESTINAL ENDOSCOPY      I have reviewed the social history and family history with the patient and they are unchanged from previous note.  ALLERGIES:  is allergic to lisinopril, carvedilol, hydralazine, and lipitor [atorvastatin].  MEDICATIONS:  Current Outpatient Medications  Medication Sig Dispense Refill   acetaminophen (TYLENOL) 500 MG tablet Take 500-1,000 mg by mouth every 6 (six) hours as needed for moderate pain or headache.     albuterol (VENTOLIN HFA) 108 (90 Base) MCG/ACT inhaler Inhale 2 puffs into the lungs every 6 (six) hours as needed for wheezing or shortness of breath.  amLODipine (NORVASC) 10 MG tablet Take 1 tablet (10 mg total) by mouth daily. 90 tablet 1   ammonium lactate (LAC-HYDRIN) 12 % lotion Apply 1 Application topically as needed for dry skin. 400 g 5   Budeson-Glycopyrrol-Formoterol (BREZTRI AEROSPHERE) 160-9-4.8 MCG/ACT AERO Inhale 2 puffs into the lungs 2 (two) times daily. 10.7 g 11   capecitabine (XELODA) 500 MG tablet Take 2 tablets by mouth every 12 hours for 14 days, then off for 7 days. Repeat every 21 days. Take within 30 minutes after meals. 56 tablet 1   cholecalciferol (VITAMIN D3) 25 MCG (1000 UNIT) tablet Take 2 tablets (2,000 Units total) by mouth daily. (Patient taking differently: Take 10,000 Units by mouth daily.) 100 tablet 1   clindamycin (CLINDAGEL) 1 % gel Apply topically 2 (two) times daily as needed. 30 g 2   clopidogrel (PLAVIX) 75 MG tablet Take 1 tablet (75 mg total) by mouth daily. 90 tablet 1   colchicine  0.6 MG tablet Take 1 tablet (0.6 mg total) by mouth daily. 14 tablet 0   ferrous gluconate (FERGON) 324 MG tablet Take 1 tablet (324 mg total) by mouth daily with breakfast. 30 tablet 0   furosemide (LASIX) 40 MG tablet TAKE 1 TABLET TWICE DAILY 180 tablet 1   isosorbide mononitrate (IMDUR) 60 MG 24 hr tablet TAKE 1 AND 1/2 TABLETS EVERY DAY 135 tablet 3   labetalol (NORMODYNE) 200 MG tablet Take 1 tablet (200 mg total) by mouth 2 (two) times daily. 180 tablet 2   losartan (COZAAR) 100 MG tablet TAKE 1 TABLET EVERY DAY 90 tablet 1   magnesium oxide (MAG-OX) 400 (240 Mg) MG tablet Take 1 tablet (400 mg total) by mouth 2 (two) times daily. 60 tablet 1   mometasone (ELOCON) 0.1 % ointment Apply topically daily. (Patient taking differently: Apply 1 Application topically daily as needed (Eczema).) 45 g 0   Multiple Vitamin (MULTIVITAMIN WITH MINERALS) TABS tablet Take 1 tablet by mouth daily. 30 tablet 0   ondansetron (ZOFRAN) 4 MG tablet Take 1 tablet (4 mg total) by mouth every 8 (eight) hours as needed for nausea or vomiting. 20 tablet 0   polyethylene glycol (MIRALAX) packet Take 17 g by mouth daily as needed for mild constipation.     potassium chloride (KLOR-CON M) 10 MEQ tablet Take 1 tablet (10 mEq total) by mouth 2 (two) times daily. 14 tablet 0   pravastatin (PRAVACHOL) 80 MG tablet TAKE 1 TABLET EVERY DAY FOR HIGH CHOLESTEROL 90 tablet 1   predniSONE (DELTASONE) 20 MG tablet Take 1 tablet (20 mg total) by mouth daily with breakfast. 5 tablet 0   vitamin B-12 (CYANOCOBALAMIN) 1000 MCG tablet Take 1,000 mcg by mouth daily.     No current facility-administered medications for this visit.    PHYSICAL EXAMINATION: ECOG PERFORMANCE STATUS: 2 - Symptomatic, <50% confined to bed  There were no vitals filed for this visit. Wt Readings from Last 3 Encounters:  08/25/22 114 lb (51.7 kg)  08/11/22 116 lb 12.8 oz (53 kg)  07/29/22 118 lb 12.8 oz (53.9 kg)      No vitals taken today, Exam not  performed today  LABORATORY DATA:  I have reviewed the data as listed    Latest Ref Rng & Units 08/25/2022    8:40 AM 08/21/2022    1:17 PM 08/11/2022   10:52 AM  CBC  WBC 4.0 - 10.5 K/uL 3.5  4.0  4.0   Hemoglobin 12.0 - 15.0 g/dL 9.1  8.8  9.1   Hematocrit 36.0 - 46.0 % 27.4  25.5  26.8   Platelets 150 - 400 K/uL 180  137  162         Latest Ref Rng & Units 08/25/2022    8:40 AM 08/21/2022    1:17 PM 08/11/2022   10:52 AM  CMP  Glucose 70 - 99 mg/dL 106  124  101   BUN 8 - 23 mg/dL 25  27  22   $ Creatinine 0.44 - 1.00 mg/dL 1.59  1.53  1.50   Sodium 135 - 145 mmol/L 143  141  141   Potassium 3.5 - 5.1 mmol/L 4.4  4.6  3.9   Chloride 98 - 111 mmol/L 107  105  105   CO2 22 - 32 mmol/L 29  28  28   $ Calcium 8.9 - 10.3 mg/dL 9.9  9.5  9.3   Total Protein 6.5 - 8.1 g/dL 7.0  6.4  6.6   Total Bilirubin 0.3 - 1.2 mg/dL 0.7  0.8  0.7   Alkaline Phos 38 - 126 U/L 109  94  88   AST 15 - 41 U/L 14  13  12   $ ALT 0 - 44 U/L 7  6  6       $ RADIOGRAPHIC STUDIES: I have personally reviewed the radiological images as listed and agreed with the findings in the report. CT Abdomen Pelvis Wo Contrast  Result Date: 09/04/2022 CLINICAL DATA:  History of rectal cancer, assess treatment response. * Tracking Code: BO * EXAM: CT ABDOMEN AND PELVIS WITHOUT CONTRAST TECHNIQUE: Multidetector CT imaging of the abdomen and pelvis was performed following the standard protocol without IV contrast. RADIATION DOSE REDUCTION: This exam was performed according to the departmental dose-optimization program which includes automated exposure control, adjustment of the mA and/or kV according to patient size and/or use of iterative reconstruction technique. COMPARISON:  May 26, 2022 FINDINGS: Lower chest: Pulmonary emphysema at the lung bases as on previous imaging. No acute process at the lung bases. Distal thoracic aortic atherosclerotic changes track into the abdominal aorta. Hepatobiliary: Stable low-density lesion  in the liver with water density compatible with small hepatic cyst. No pericholecystic stranding or sign of gross biliary duct distension. Pancreas: Pancreas with normal contours, there is low-attenuation in the pancreatic body (image 27/5) measuring 11 mm. This may communicate with the pancreatic duct where there is either focal dilation of the main pancreatic duct or a separate lesion measuring 8 mm (image 28/5) mild prominence of the main pancreatic duct central to this. Areas enlarged minimally since previous imaging from February of 2023 are not well assessed by current imaging, dominant area at 8 mm and smaller area at 6 mm on the prior exam as compared to 11 and 8 mm currently. Spleen: Normal. Adrenals/Urinary Tract: Adrenal glands are normal. Smooth lobular renal contours due to underlying Bosniak category II cysts of the LEFT and RIGHT kidney, largest on the RIGHT measuring 2.5 cm in the upper pole. Largest on the LEFT, lateral interpolar LEFT kidney measuring 2.1 cm. Renal vascular calcifications standing into the parenchyma. Nephrolithiasis on the LEFT with a 2 mm calculus in the interpolar LEFT kidney No perivesical stranding.  No gross bladder wall thickening. Stomach/Bowel: No signs of bowel obstruction or signs of acute bowel process. Normal appendix. Visible rectal thickening is eccentric measuring approximately 12-13 mm, nonspecific on CT and not substantially changed compared to prior imaging (image 59/2) Vascular/Lymphatic: Atherosclerotic changes of the abdominal aorta.  No aneurysmal dilation. There is no gastrohepatic or hepatoduodenal ligament lymphadenopathy. No retroperitoneal or mesenteric lymphadenopathy. No pelvic sidewall lymphadenopathy. Mildly prominent bilateral lymph nodes in the LEFT and RIGHT groin are diminished in conspicuity. Reproductive: Post hysterectomy. Other: No ascites.  No peritoneal nodularity. Musculoskeletal: Osteopenia and spinal degenerative changes. Mild levoconvex  curvature of the lumbar spine similar to prior imaging. IMPRESSION: 1. Visible rectal thickening is eccentric measuring approximately 12-13 mm, nonspecific on CT and not substantially changed compared to prior imaging. 2. No evidence of metastatic disease in the abdomen or pelvis. 3. Suggestion of interval enlargement of low density pancreatic lesions. Contrasted imaging with CT or MRI may be helpful, as warranted as mixed type intraductal papillary mucinous neoplasm with enlargement could have this appearance. Would suggest that the patient not ingest iron tablets immediately prior to scanning as may have occurred on the previous exam, a potential explanation for artifact seen about the pancreas and stomach on prior imaging. 4. Pulmonary emphysema and aortic atherosclerosis. Aortic Atherosclerosis (ICD10-I70.0) and Emphysema (ICD10-J43.9). Electronically Signed   By: Zetta Bills M.D.   On: 09/04/2022 08:17      Orders Placed This Encounter  Procedures   Magnesium    Standing Status:   Future    Standing Expiration Date:   10/04/2023   All questions were answered. The patient knows to call the clinic with any problems, questions or concerns. No barriers to learning was detected. The total time spent in the appointment was 22 minutes.     Truitt Merle, MD 09/04/2022   Felicity Coyer am acting as scribe for Truitt Merle, MD.   I have reviewed the above documentation for accuracy and completeness, and I agree with the above.

## 2022-09-04 ENCOUNTER — Inpatient Hospital Stay (HOSPITAL_BASED_OUTPATIENT_CLINIC_OR_DEPARTMENT_OTHER): Payer: Medicare HMO | Admitting: Hematology

## 2022-09-04 DIAGNOSIS — J449 Chronic obstructive pulmonary disease, unspecified: Secondary | ICD-10-CM | POA: Diagnosis not present

## 2022-09-04 DIAGNOSIS — C20 Malignant neoplasm of rectum: Secondary | ICD-10-CM | POA: Diagnosis not present

## 2022-09-04 NOTE — Assessment & Plan Note (Signed)
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 -pt declined surgery  --s/p concurrent chemoradiation with Xeloda 10/21/21 - 11/28/21. Tolerated well overall.  -due to residual disease after chemoRT, she started Xeloda and Vectibix on 06/09/2022.  She is tolerating moderately well overall. -Due to her worsening fatigue, patient would like to take a chemo break.   -CT scan from yesterday showed

## 2022-09-04 NOTE — Telephone Encounter (Signed)
Called & spoke to the patient. Verified name & DOB. I let them patient know that the referral to the pulmonologist was sent due to low oxygen levels. Patient stated that she is unable to remember declining the referral. Ms.Braggs has requested to still proceed with the pulmonology referral.

## 2022-09-04 NOTE — Assessment & Plan Note (Signed)
-  Secondary to Vectibix -She is IV and oral mag

## 2022-09-04 NOTE — Assessment & Plan Note (Signed)
-  She was found to be hypoxic during her last visit on July 16, 2022.  She was sent to emergency room for evaluation.  CTA was negative for PE, but showed emphysema. -She has been seen by her primary care physician and was referred to pulmonary -She is on oxygen as needed.

## 2022-09-08 ENCOUNTER — Inpatient Hospital Stay: Payer: Medicare HMO

## 2022-09-08 ENCOUNTER — Inpatient Hospital Stay: Payer: Medicare HMO | Admitting: Hematology

## 2022-09-10 ENCOUNTER — Ambulatory Visit: Payer: Self-pay

## 2022-09-10 NOTE — Patient Instructions (Signed)
Visit Information  Thank you for taking time to visit with me today. Please don't hesitate to contact me if I can be of assistance to you.   Following are the goals we discussed today:   Goals Addressed             This Visit's Progress    To improve fatigue and nutrition       Care Coordination Interventions: Assessed patient understanding of cancer diagnosis and recommended treatment plan Reviewed upcoming provider appointments and treatment appointments Assessed available transportation to appointments and treatments. Has consistent/reliable transportation: Yes Assessed support system. Has consistent/reliable family or other support: Yes Referred to licensed clinical social work for Meals on Wheels; resources to assist with housekeeping  Nutrition assessment performed Assisted with getting weight scale and coupons for Ensure via [place] next appointment is by telephone on 09/24/22 at 12:15 PM  Please call the care guide team at [phone] if you need to cancel or reschedule your appointment.   If you are experiencing a Mental Health or [place] or need someone to talk to, please call 1-800-273-TALK (toll free, 24 hour hotline) go to [facility] [place] [phone])  The patient verbalized understanding of instructions, educational materials, and care plan provided today and agreed to receive a mailed copy of patient instructions, educational materials, and care plan.   Barb Merino, RN, BSN, CCM Care Management Coordinator [facility] Care Management  Direct Phone: [phone]

## 2022-09-10 NOTE — Patient Outreach (Signed)
  Care Coordination   Initial Visit Note   09/10/2022 Name: Tina Waller MRN: [id number] DOB: Aug 03, 1940  Tina Waller is a 82 y.o. year old female who sees [name], MD for primary care. I spoke with  [name] by phone today.  What matters to the patients health and wellness today?  Patient would like to work on improving her fatigue and nutritional intake.       Goals Addressed             This Visit's Progress    To improve fatigue and nutrition       Care Coordination Interventions: Assessed patient understanding of cancer diagnosis and recommended treatment plan Reviewed upcoming provider appointments and treatment appointments Assessed available transportation to appointments and treatments. Has consistent/reliable transportation: Yes Assessed support system. Has consistent/reliable family or other support: Yes Referred to licensed clinical social work for Meals on Wheels; resources to assist with housekeeping  Nutrition assessment performed Assisted with getting weight scale and coupons for Ensure via [place] [facility]            SDOH assessments and interventions completed:  Yes  SDOH Interventions Today    Flowsheet Row Most Recent Value  SDOH Interventions   Transportation Interventions Intervention Not Indicated        Care Coordination Interventions:  Yes, provided   Follow up plan: Referral made to SW referral sent to [place] to assist with Meals on Wheels and resources to assist with housekeeping Follow up call scheduled for 09/24/22 @12$ :15 PM    Encounter Outcome:  Pt. Visit Completed

## 2022-09-16 ENCOUNTER — Ambulatory Visit: Payer: Self-pay

## 2022-09-16 ENCOUNTER — Other Ambulatory Visit (HOSPITAL_COMMUNITY): Payer: Self-pay

## 2022-09-16 NOTE — Patient Outreach (Signed)
  Care Coordination   Follow Up Visit Note   09/16/2022 Name: Tina Waller MRN: [id number] DOB: 1940/11/10  Tina Waller is a 82 y.o. year old female who sees [name], MD for primary care. I spoke with  [name] by phone today.  What matters to the patients health and wellness today?  I would like meals on wheels    Goals Addressed             This Visit's Progress    COMPLETED: Care Coordination Activities       Care Coordination Interventions: Referral received from [place] indicating patient is interested in a referral to meals on wheels Discussed patient is able to afford food but is experiencing difficulty with food preparation. Patient uses a walker and is on oxygen. She removes her oxygen when preparing meals due to concerns she may trip on the tubing Education on Meals on Wheels program offered by [employer] - patient believes she may have been placed on the waiting list several months ago but she is not sure Discussed plan for SW to send a referral to ensure patient is on the wait list. Advised the patient there is a waiting list which usually takes at least 6 months if not longer Assessed for social supports who could assist the patient with meal preparation. Patient indicates she has a neighbor who would be willing to help but the patient feels they would be too messy so she prefers to do it herself Determined the patient would like assistance with housekeeping Performed chart review to note patient engaged with [place] who has referred the patient to [facility] [employer] [facility]. Patient indicates she has been unsuccessful with contacting this resource. Advised the patient SW would contact S. Eder to request she outreach the patient Collaboration with LCSW Trilby Leaver requesting she contact the patient to follow up on [facility] a [facility] referral Collaboration with [place] to advise of  interventions and plan for this SW to sign off considering patient is engaged with [place] assessments and interventions completed:  Yes  SDOH Interventions Today    Flowsheet Row Most Recent Value  SDOH Interventions   Food Insecurity Interventions Intervention Not Indicated, Other (Comment), [id number] Referral  [referral to mobile meals]  Housing Interventions Intervention Not Indicated  Transportation Interventions Intervention Not Indicated  Utilities Interventions Intervention Not Indicated        Care Coordination Interventions:  Yes, provided   Interventions Today    Flowsheet Row Most Recent Value  Chronic Disease   Chronic disease during today's visit Hypertension (HTN), Chronic Obstructive Pulmonary Disease (COPD), Other  General Interventions   General Interventions Discussed/Reviewed General Interventions Discussed, Emergency planning/management officer, Communication with  Communication with Social Work  [employer] SW]  Education Interventions   Education Provided Provided Education  Provided Verbal Education On [employer]        Follow up plan: No further intervention required. Referral made to [employer] on [employer].    Encounter Outcome:  Pt. Visit Completed   Daneen Schick, [place], CDP Social Worker, Certified Dementia Practitioner [facility] Coordination [phone]

## 2022-09-16 NOTE — Patient Instructions (Signed)
Visit Information  Thank you for taking time to visit with me today. Please don't hesitate to contact me if I can be of assistance to you.   Following are the goals we discussed today:   Goals Addressed             This Visit's Progress    COMPLETED: Care Coordination Activities       Care Coordination Interventions: Referral received from [place] indicating patient is interested in a referral to meals on wheels Discussed patient is able to afford food but is experiencing difficulty with food preparation. Patient uses a walker and is on oxygen. She removes her oxygen when preparing meals due to concerns she may trip on the tubing Education on Meals on Wheels program offered by [employer] - patient believes she may have been placed on the waiting list several months ago but she is not sure Discussed plan for SW to send a referral to ensure patient is on the wait list. Advised the patient there is a waiting list which usually takes at least 6 months if not longer Assessed for social supports who could assist the patient with meal preparation. Patient indicates she has a neighbor who would be willing to help but the patient feels they would be too messy so she prefers to do it herself Determined the patient would like assistance with housekeeping Performed chart review to note patient engaged with [place] who has referred the patient to [facility]. Patient indicates she has been unsuccessful with contacting this resource. Advised the patient SW would contact S. Eder to request she outreach the patient Collaboration with LCSW Trilby Leaver requesting she contact the patient to follow up on [facility] a reason referral Collaboration with [place] to advise of interventions and plan for this SW to sign off considering patient is engaged with [place] next appointment is by telephone on 3/13 with  [place]  Please call the care guide team at [phone] if you need to cancel or reschedule your appointment.   If you are experiencing a Mental Health or [place] or need someone to talk to, please call 911  The patient verbalized understanding of instructions, educational materials, and care plan provided today and DECLINED offer to receive copy of patient instructions, educational materials, and care plan.   Daneen Schick, BSW, CDP Social Worker, Certified Dementia Practitioner [facility]  Care Coordination [phone]

## 2022-09-17 ENCOUNTER — Other Ambulatory Visit: Payer: Self-pay | Admitting: Hematology

## 2022-09-17 ENCOUNTER — Inpatient Hospital Stay: Payer: Medicare HMO | Attending: Hematology | Admitting: Licensed Clinical Social Worker

## 2022-09-17 ENCOUNTER — Other Ambulatory Visit: Payer: Self-pay | Admitting: Hematology and Oncology

## 2022-09-17 DIAGNOSIS — C20 Malignant neoplasm of rectum: Secondary | ICD-10-CM

## 2022-09-17 DIAGNOSIS — Z79899 Other long term (current) drug therapy: Secondary | ICD-10-CM | POA: Insufficient documentation

## 2022-09-17 DIAGNOSIS — Z5112 Encounter for antineoplastic immunotherapy: Secondary | ICD-10-CM | POA: Insufficient documentation

## 2022-09-17 NOTE — Progress Notes (Signed)
START OFF PATHWAY REGIMEN - Colorectal   [id number]:Panitumumab every 14 days:   A cycle is every 14 days:     Panitumumab   **Always confirm dose/schedule in your pharmacy ordering system**  Patient Characteristics: Postoperative without Neoadjuvant Therapy, M0 (Pathologic Staging), Rectal, pT0 - pT2, pN0 Tumor Location: Rectal Therapeutic Status: Postoperative without Neoadjuvant Therapy, M0 (Pathologic Staging) AJCC M Category: cM0 AJCC T Category: pT2 AJCC N Category: pN0 AJCC 8 Stage Grouping: I Intent of Therapy: Curative Intent, Discussed with Patient

## 2022-09-17 NOTE — Progress Notes (Signed)
[facility] CSW Progress Note  [employer] representative  received a call from pt informing that she has not heard back from the cleaning company to schedule a cleaning through [facility] after leaving two phone messages.  CSW emailed cleaning company to request they call pt to schedule a cleaning.  CSW to follow up w/ pt once a response is received.        Henriette Combs, LCSW

## 2022-09-18 ENCOUNTER — Ambulatory Visit: Payer: Medicare HMO | Admitting: Internal Medicine

## 2022-09-18 ENCOUNTER — Encounter: Payer: Self-pay | Admitting: Internal Medicine

## 2022-09-18 VITALS — BP 195/81 | HR 75 | Temp 98.1°F | Ht 69.0 in | Wt 116.0 lb

## 2022-09-18 DIAGNOSIS — Z5321 Procedure and treatment not carried out due to patient leaving prior to being seen by health care provider: Secondary | ICD-10-CM

## 2022-09-18 NOTE — Progress Notes (Signed)
Patient left before being seen.

## 2022-09-20 NOTE — Progress Notes (Unsigned)
Synopsis: Referred for emphysema by [name], MD  Subjective:   PATIENT ID: Tina Waller GENDER: female DOB: [birth year], MRN: [id number]  No chief complaint on file.  81yF with history of anemia, rectal cancer stage Ic dx 08/2021 on capecitabine and panitumumab, smoking, DM, covid-19 infection, GERD, nocturnal hypoxia, CVA 2020 referred for emphysema  Otherwise pertinent review of systems is negative.  Past Medical History:  Diagnosis Date   Anemia    Anxiety    Cancer ([place])    rectal   Cataract    Depression    Diabetes mellitus without complication ([place])    pt denies   GERD (gastroesophageal reflux disease)    Gout    Headache    Hyperlipidemia    Hypertension    Oxygen deficiency    wears oxygen at noc   Stroke [facility]) 2008   09/14/2018   Tonsillar mass    Type 2 diabetes mellitus ([place]) 09/14/2018   Vertigo    Wears dentures    upper   Wears glasses      Family History  Problem Relation Age of Onset   Heart disease Mother        CHF   Osteoporosis Neg Hx    Colon cancer Neg Hx    Stomach cancer Neg Hx    Pancreatic cancer Neg Hx    Esophageal cancer Neg Hx    Rectal cancer Neg Hx      Past Surgical History:  Procedure Laterality Date   ABDOMINAL HYSTERECTOMY     BIOPSY  08/15/2021   Procedure: BIOPSY;  Surgeon: Irving Copas., MD;  Location: [facility];  Service: Gastroenterology;;   BIOPSY  04/17/2022   Procedure: BIOPSY;  Surgeon: Irving Copas., MD;  Location: [facility];  Service: Gastroenterology;;   BREAST SURGERY     lumpectomy   BUNIONECTOMY     CATARACT EXTRACTION Left    COLONOSCOPY     COLONOSCOPY W/ BIOPSIES AND POLYPECTOMY     COLONOSCOPY WITH PROPOFOL N/A 08/15/2021   Procedure: COLONOSCOPY WITH PROPOFOL;  Surgeon: Irving Copas., MD;  Location: [facility]COPY;  Service: Gastroenterology;  Laterality: N/A;   ESOPHAGOGASTRODUODENOSCOPY (EGD) WITH PROPOFOL N/A 08/15/2021   Procedure:  ESOPHAGOGASTRODUODENOSCOPY (EGD) WITH PROPOFOL;  Surgeon: Rush Landmark Telford Nab., MD;  Location: [place];  Service: Gastroenterology;  Laterality: N/A;   FLEXIBLE SIGMOIDOSCOPY N/A 04/17/2022   Procedure: FLEXIBLE SIGMOIDOSCOPY;  Surgeon: Rush Landmark Telford Nab., MD;  Location: [facility];  Service: Gastroenterology;  Laterality: N/A;   HEMOSTASIS CLIP PLACEMENT  08/15/2021   Procedure: HEMOSTASIS CLIP PLACEMENT;  Surgeon: Irving Copas., MD;  Location: [facility];  Service: Gastroenterology;;   HOT HEMOSTASIS N/A 08/15/2021   Procedure: HOT HEMOSTASIS (ARGON PLASMA COAGULATION/BICAP);  Surgeon: Irving Copas., MD;  Location: [place];  Service: Gastroenterology;  Laterality: N/A;   MULTIPLE TOOTH EXTRACTIONS     POLYPECTOMY  08/15/2021   Procedure: POLYPECTOMY;  Surgeon: Mansouraty, Telford Nab., MD;  Location: [place];  Service: Gastroenterology;;   SUBMUCOSAL TATTOO INJECTION  08/15/2021   Procedure: SUBMUCOSAL TATTOO INJECTION;  Surgeon: Irving Copas., MD;  Location: [facility] [place];  Service: Gastroenterology;;   TONGUE BIOPSY Right 12/13/2018   Procedure: RIGHT TONSIL BIOPSY;  Surgeon: Melida Quitter, MD;  Location: [facility];  Service: ENT;  Laterality: Right;   UPPER GASTROINTESTINAL ENDOSCOPY      Social History   Socioeconomic History   Marital status: Divorced    Spouse name: Not on file  Number of children: Not on file   Years of education: Not on file   Highest education level: Not on file  Occupational History   Not on file  Tobacco Use   Smoking status: Former    Types: Cigarettes    Quit date: 07/14/2006    Years since quitting: 16.1   Smokeless tobacco: Never  Vaping Use   Vaping Use: Never used  Substance and Sexual Activity   Alcohol use: Not Currently    Alcohol/week: 4.0 standard drinks of alcohol    Types: 4 Shots of liquor per week    Comment: none since Jan   Drug use: No   Sexual activity: Not Currently  Other  Topics Concern   Not on file  Social History Narrative   Not on file   Social Determinants of Health   Financial Resource Strain: Not on file  Food Insecurity: No Food Insecurity (09/16/2022)   Hunger Vital Sign    Worried About Running Out of Food in the Last Year: Never true    Ran Out of Food in the Last Year: Never true  Transportation Needs: No Transportation Needs (09/16/2022)   PRAPARE - Hydrologist (Medical): No    Lack of Transportation (Non-Medical): No  Physical Activity: Not on file  Stress: Not on file  Social Connections: Not on file  Intimate Partner Violence: Not At Risk (10/08/2021)   Humiliation, Afraid, Rape, and Kick questionnaire    Fear of Current or Ex-Partner: No    Emotionally Abused: No    Physically Abused: No    Sexually Abused: No     Allergies  Allergen Reactions   Lisinopril Cough   Carvedilol Diarrhea   Hydralazine Diarrhea and Nausea And Vomiting   Lipitor [Atorvastatin] Diarrhea     Outpatient Medications Prior to Visit  Medication Sig Dispense Refill   acetaminophen (TYLENOL) 500 MG tablet Take 500-1,000 mg by mouth every 6 (six) hours as needed for moderate pain or headache.     albuterol (VENTOLIN HFA) 108 (90 Base) MCG/ACT inhaler Inhale 2 puffs into the lungs every 6 (six) hours as needed for wheezing or shortness of breath.     amLODipine (NORVASC) 10 MG tablet Take 1 tablet (10 mg total) by mouth daily. 90 tablet 1   ammonium lactate (LAC-HYDRIN) 12 % lotion Apply 1 Application topically as needed for dry skin. 400 g 5   Budeson-Glycopyrrol-Formoterol (BREZTRI AEROSPHERE) 160-9-4.8 MCG/ACT AERO Inhale 2 puffs into the lungs 2 (two) times daily. 10.7 g 11   capecitabine (XELODA) 500 MG tablet Take 2 tablets by mouth every 12 hours for 14 days, then off for 7 days. Repeat every 21 days. Take within 30 minutes after meals. 56 tablet 1   cholecalciferol (VITAMIN D3) 25 MCG (1000 UNIT) tablet Take 2 tablets (2,000  Units total) by mouth daily. (Patient taking differently: Take 10,000 Units by mouth daily.) 100 tablet 1   clindamycin (CLINDAGEL) 1 % gel Apply topically 2 (two) times daily as needed. 30 g 2   clopidogrel (PLAVIX) 75 MG tablet Take 1 tablet (75 mg total) by mouth daily. 90 tablet 1   colchicine 0.6 MG tablet Take 1 tablet (0.6 mg total) by mouth daily. 14 tablet 0   ferrous gluconate (FERGON) 324 MG tablet Take 1 tablet (324 mg total) by mouth daily with breakfast. 30 tablet 0   furosemide (LASIX) 40 MG tablet TAKE 1 TABLET TWICE DAILY 180 tablet 1   isosorbide  mononitrate (IMDUR) 60 MG 24 hr tablet TAKE 1 AND 1/2 TABLETS EVERY DAY 135 tablet 3   labetalol (NORMODYNE) 200 MG tablet Take 1 tablet (200 mg total) by mouth 2 (two) times daily. 180 tablet 2   losartan (COZAAR) 100 MG tablet TAKE 1 TABLET EVERY DAY 90 tablet 1   magnesium oxide (MAG-OX) 400 (240 Mg) MG tablet Take 1 tablet (400 mg total) by mouth 2 (two) times daily. 60 tablet 1   mometasone (ELOCON) 0.1 % ointment Apply topically daily. (Patient taking differently: Apply 1 Application topically daily as needed (Eczema).) 45 g 0   Multiple Vitamin (MULTIVITAMIN WITH MINERALS) TABS tablet Take 1 tablet by mouth daily. 30 tablet 0   ondansetron (ZOFRAN) 4 MG tablet Take 1 tablet (4 mg total) by mouth every 8 (eight) hours as needed for nausea or vomiting. 20 tablet 0   polyethylene glycol (MIRALAX) packet Take 17 g by mouth daily as needed for mild constipation.     potassium chloride (KLOR-CON M) 10 MEQ tablet Take 1 tablet (10 mEq total) by mouth 2 (two) times daily. 14 tablet 0   pravastatin (PRAVACHOL) 80 MG tablet TAKE 1 TABLET EVERY DAY FOR HIGH CHOLESTEROL 90 tablet 1   predniSONE (DELTASONE) 20 MG tablet Take 1 tablet (20 mg total) by mouth daily with breakfast. 5 tablet 0   vitamin B-12 (CYANOCOBALAMIN) 1000 MCG tablet Take 1,000 mcg by mouth daily.     No facility-administered medications prior to visit.        Objective:   Physical Exam:  General appearance: 82 y.o., female, NAD, conversant  Eyes: anicteric sclerae; PERRL, tracking appropriately HENT: NCAT; MMM Neck: Trachea midline; no lymphadenopathy, no JVD Lungs: CTAB, no crackles, no wheeze, with normal respiratory effort CV: RRR, no murmur  Abdomen: Soft, non-tender; non-distended, BS present  Extremities: No peripheral edema, warm Skin: Normal turgor and texture; no rash Psych: Appropriate affect Neuro: Alert and oriented to person and place, no focal deficit     There were no vitals filed for this visit.   on *** LPM *** RA BMI Readings from Last 3 Encounters:  09/18/22 17.13 kg/m  08/25/22 16.83 kg/m  08/11/22 17.25 kg/m   Wt Readings from Last 3 Encounters:  09/18/22 116 lb (52.6 kg)  08/25/22 114 lb (51.7 kg)  08/11/22 116 lb 12.8 oz (53 kg)     CBC    Component Value Date/Time   WBC 3.5 (L) 08/25/2022 0840   WBC 3.8 (L) 06/18/2022 2232   RBC 2.73 (L) 08/25/2022 0840   HGB 9.1 (L) 08/25/2022 0840   HGB 13.1 04/19/2021 1459   HCT 27.4 (L) 08/25/2022 0840   HCT 37.5 04/19/2021 1459   PLT 180 08/25/2022 0840   PLT 232 04/19/2021 1459   MCV 100.4 (H) 08/25/2022 0840   MCV 95 04/19/2021 1459   MCH 33.3 08/25/2022 0840   MCHC 33.2 08/25/2022 0840   RDW 17.1 (H) 08/25/2022 0840   RDW 11.6 (L) 04/19/2021 1459   LYMPHSABS 1.2 08/25/2022 0840   LYMPHSABS 2.0 07/26/2020 1503   MONOABS 0.7 08/25/2022 0840   EOSABS 0.3 08/25/2022 0840   EOSABS 0.2 07/26/2020 1503   BASOSABS 0.0 08/25/2022 0840   BASOSABS 0.0 07/26/2020 1503    Eos 100-300  Chest Imaging: CTA Chest 07/16/22 with advanced upper lobe predominant emphysema  Pulmonary Functions Testing Results:     No data to display           Echocardiogram 09/20/22:  1. Left ventricular ejection fraction, by estimation, is 60 to 65%. The  left ventricle has normal function. The left ventricle has no regional  wall motion abnormalities. Left  ventricular diastolic parameters are  consistent with Grade I diastolic  dysfunction (impaired relaxation). The average left ventricular global  longitudinal strain is -17.0 %. The global longitudinal strain is normal.   2. Right ventricular systolic function is normal. The right ventricular  size is normal. There is normal pulmonary artery systolic pressure. The  estimated right ventricular systolic pressure is [id number] mmHg.   3. The mitral valve is degenerative. Trivial mitral valve regurgitation.  No evidence of mitral stenosis.   4. Tricuspid valve regurgitation is mild to moderate.   5. The aortic valve is tricuspid. Aortic valve regurgitation is not  visualized. Aortic valve sclerosis is present, with no evidence of aortic  valve stenosis.   6. The inferior vena cava is normal in size with greater than 50%  respiratory variability, suggesting right atrial pressure of 3 mmHg      Assessment & Plan:    Plan:      Maryjane Hurter, MD [facility] 09/20/2022 4:23 PM

## 2022-09-22 ENCOUNTER — Other Ambulatory Visit: Payer: Medicare HMO

## 2022-09-22 ENCOUNTER — Ambulatory Visit: Payer: Medicare HMO

## 2022-09-22 ENCOUNTER — Ambulatory Visit: Payer: Medicare HMO | Admitting: Hematology

## 2022-09-23 ENCOUNTER — Ambulatory Visit (INDEPENDENT_AMBULATORY_CARE_PROVIDER_SITE_OTHER): Payer: Medicare HMO | Admitting: Student

## 2022-09-23 ENCOUNTER — Telehealth: Payer: Self-pay | Admitting: Student

## 2022-09-23 ENCOUNTER — Encounter: Payer: Self-pay | Admitting: Student

## 2022-09-23 VITALS — BP 130/60 | HR 68 | Temp 97.7°F | Ht 69.0 in | Wt 115.4 lb

## 2022-09-23 DIAGNOSIS — J432 Centrilobular emphysema: Secondary | ICD-10-CM

## 2022-09-23 DIAGNOSIS — R0609 Other forms of dyspnea: Secondary | ICD-10-CM

## 2022-09-23 MED ORDER — BREZTRI AEROSPHERE 160-9-4.8 MCG/ACT IN AERO: 2.0000 | INHALATION_SPRAY | Freq: Two times a day (BID) | RESPIRATORY_TRACT | 0 refills | Status: AC

## 2022-09-23 MED ORDER — AEROCHAMBER MV MISC: 0 refills | Status: AC

## 2022-09-23 MED ORDER — SPACER/AERO-HOLDING CHAMBERS DEVI: 0 refills | Status: AC

## 2022-09-23 NOTE — Telephone Encounter (Signed)
What will breztri cost her vs LABA/LAMA options instead?  Thanks!

## 2022-09-23 NOTE — Telephone Encounter (Signed)
What is least expensive - breztri? Or what would laba/lama options cost her?  Thanks!

## 2022-09-23 NOTE — Patient Instructions (Signed)
-   try breztri inhaler can use up to 2 puffs twice daily WITH spacer, RINSE mouth and spacer after each use - see you in 3 months or sooner if need be!

## 2022-09-23 NOTE — Addendum Note (Signed)
Addended by: Rosana Berger on: 09/23/2022 02:28 PM   Modules accepted: Orders

## 2022-09-24 ENCOUNTER — Ambulatory Visit: Payer: Self-pay

## 2022-09-24 ENCOUNTER — Other Ambulatory Visit (HOSPITAL_COMMUNITY): Payer: Self-pay

## 2022-09-24 ENCOUNTER — Telehealth: Payer: Self-pay | Admitting: Internal Medicine

## 2022-09-24 NOTE — Patient Outreach (Signed)
  Care Coordination   Follow Up Visit Note   09/24/2022 Name: RONELLE MICHIE MRN: [id number] DOB: 05/03/1941  KARTHIKA GLASPER is a 82 y.o. year old female who sees [name], MD for primary care. I spoke with  [name] by phone today.  What matters to the patients health and wellness today?  Patient will continue to balance her activity with rest. She will drink Ensure for protein supplement and monitor her weight at home.     Goals Addressed             This Visit's Progress    To improve fatigue and nutrition       Care Coordination Interventions: Confirmed patient received her scales sent by [facility], she has lost 1 lb since last week Determined patient will continue to balance her activity with rest and drink Ensure protein drinks as directed, she received Ensure coupons per [facility] Discussed patient will receive help with housekeeping this week from an agency referred by the [place], "[employer]"  Discussed with patient her recent visit with PCP Dr. Wynetta Emery, she was not seen after waiting for an extended period of time (over an hour), she had to leave without being seen due to having arranged transportation that was scheduled to pick her up by 4:30 PM Discussed patient did not reschedule, she may consider locating a new PCP Encouraged patient to reschedule her missed appointment, provided direction on how to retrieve a list of in network providers by contacting her health plan to request a list be mailed to her due to patient does not have a computer          SDOH assessments and interventions completed:  No     Care Coordination Interventions:  Yes, provided   Follow up plan: Follow up call scheduled for 10/22/22 @11 :00 AM    Encounter Outcome:  Pt. Visit Completed

## 2022-09-24 NOTE — Telephone Encounter (Signed)
Called & spoke to the patient. Verified name & DOB. Scheduled appointment for 10/24/2022 at 1:50 p.m. Patient agreed and confirmed appointment.

## 2022-09-24 NOTE — Patient Instructions (Signed)
Visit Information  Thank you for taking time to visit with me today. Please don't hesitate to contact me if I can be of assistance to you.   Following are the goals we discussed today:   Goals Addressed             This Visit's Progress    To improve fatigue and nutrition       Care Coordination Interventions: Confirmed patient received her scales sent by [facility], she has lost 1 lb since last week Determined patient will continue to balance her activity with rest and drink Ensure protein drinks as directed, she received Ensure coupons per [facility] Discussed patient will receive help with housekeeping this week from an agency referred by the [place] [facility], "[employer]"  Discussed with patient her recent visit with PCP Dr. Wynetta Emery, she was not seen after waiting for an extended period of time (over an hour), she had to leave without being seen due to having arranged transportation that was scheduled to pick her up by 4:30 PM Discussed patient did not reschedule, she may consider locating a new PCP Encouraged patient to reschedule her missed appointment, provided direction on how to retrieve a list of in network providers by contacting her health plan to request a list be mailed to her due to patient does not have a computer          Our next appointment is by telephone on 10/22/22 at 11:00 AM  Please call the care guide team at [phone] if you need to cancel or reschedule your appointment.   If you are experiencing a Mental Health or [place] or need someone to talk to, please call 1-800-273-TALK (toll free, 24 hour hotline) go to [facility] [place] [phone])  The patient verbalized understanding of instructions, educational materials, and care plan provided today and DECLINED offer to receive copy of patient instructions, educational materials, and care plan.   Barb Merino, RN, BSN, CCM Care Management Coordinator [facility] Care Management Direct Phone: [phone]

## 2022-09-24 NOTE — Telephone Encounter (Signed)
Per test claims these are the co-pays for the covered medications at this time.  ICS/LABA/LAMA Breztri- $45.00  LABA/LAMA Bevespi- $95.00 Stiolto- $45.00

## 2022-09-24 NOTE — Telephone Encounter (Signed)
PC placed to pt this afternoon. I apologized that she had to leave before I was able to see her on what was suppose to be a f/u visit with me 09/18/2022.  Pt stated that she has a very narrow window from the time that her transportation drops her off to the time they return to pick her up.  They returned to pick her up before I saw her.  Advised patient that if she would like I would have our office to reschedule her appointment and try to give her the first appointment of the afternoon which is the 1:30 slot so that she would be the first seen.  Pt states she would appreciate that.  Message sent to my CMA to get her scheduled.

## 2022-09-30 ENCOUNTER — Other Ambulatory Visit (HOSPITAL_COMMUNITY): Payer: Self-pay

## 2022-10-01 ENCOUNTER — Telehealth: Payer: Self-pay | Admitting: Hematology

## 2022-10-01 NOTE — Telephone Encounter (Signed)
Per 3/20 IB reached out to patient to answer questions regarding upcoming appointment.

## 2022-10-02 ENCOUNTER — Other Ambulatory Visit: Payer: Self-pay

## 2022-10-02 ENCOUNTER — Other Ambulatory Visit (HOSPITAL_COMMUNITY): Payer: Self-pay

## 2022-10-02 DIAGNOSIS — C20 Malignant neoplasm of rectum: Secondary | ICD-10-CM

## 2022-10-03 ENCOUNTER — Encounter: Payer: Self-pay | Admitting: Hematology

## 2022-10-03 ENCOUNTER — Inpatient Hospital Stay: Payer: Medicare HMO

## 2022-10-03 ENCOUNTER — Other Ambulatory Visit: Payer: Self-pay

## 2022-10-03 ENCOUNTER — Inpatient Hospital Stay (HOSPITAL_BASED_OUTPATIENT_CLINIC_OR_DEPARTMENT_OTHER): Payer: Medicare HMO | Admitting: Hematology

## 2022-10-03 VITALS — BP 163/78 | HR 72 | Resp 14

## 2022-10-03 VITALS — BP 133/75 | HR 90 | Temp 98.0°F | Resp 17 | Ht 69.0 in | Wt 117.3 lb

## 2022-10-03 DIAGNOSIS — C20 Malignant neoplasm of rectum: Secondary | ICD-10-CM

## 2022-10-03 DIAGNOSIS — Z79899 Other long term (current) drug therapy: Secondary | ICD-10-CM | POA: Diagnosis not present

## 2022-10-03 DIAGNOSIS — J449 Chronic obstructive pulmonary disease, unspecified: Secondary | ICD-10-CM | POA: Diagnosis not present

## 2022-10-03 DIAGNOSIS — Z5112 Encounter for antineoplastic immunotherapy: Secondary | ICD-10-CM | POA: Diagnosis not present

## 2022-10-03 DIAGNOSIS — Z7189 Other specified counseling: Secondary | ICD-10-CM

## 2022-10-03 LAB — IRON AND IRON BINDING CAPACITY (CC-WL,HP ONLY)
Iron: 76 ug/dL (ref 28–170)
Saturation Ratios: 24 % (ref 10.4–31.8)
TIBC: 315 ug/dL (ref 250–450)
UIBC: 239 ug/dL (ref 148–442)

## 2022-10-03 LAB — CMP (CANCER CENTER ONLY)
ALT: 9 U/L (ref 0–44)
AST: 15 U/L (ref 15–41)
Albumin: 4.1 g/dL (ref 3.5–5.0)
Alkaline Phosphatase: 114 U/L (ref 38–126)
Anion gap: 6 (ref 5–15)
BUN: 26 mg/dL — ABNORMAL HIGH (ref 8–23)
CO2: 26 mmol/L (ref 22–32)
Calcium: 9.4 mg/dL (ref 8.9–10.3)
Chloride: 108 mmol/L (ref 98–111)
Creatinine: 1.44 mg/dL — ABNORMAL HIGH (ref 0.44–1.00)
GFR, Estimated: 37 mL/min — ABNORMAL LOW (ref >60)
Glucose, Bld: 116 mg/dL — ABNORMAL HIGH (ref 70–99)
Potassium: 4.5 mmol/L (ref 3.5–5.1)
Sodium: 140 mmol/L (ref 135–145)
Total Bilirubin: 0.4 mg/dL (ref 0.3–1.2)
Total Protein: 6.9 g/dL (ref 6.5–8.1)

## 2022-10-03 LAB — CBC WITH DIFFERENTIAL (CANCER CENTER ONLY)
Abs Immature Granulocytes: 0.01 10*3/uL (ref 0.00–0.07)
Basophils Absolute: 0 10*3/uL (ref 0.0–0.1)
Basophils Relative: 0 %
Eosinophils Absolute: 0.1 10*3/uL (ref 0.0–0.5)
Eosinophils Relative: 3 %
HCT: 27.3 % — ABNORMAL LOW (ref 36.0–46.0)
Hemoglobin: 9.4 g/dL — ABNORMAL LOW (ref 12.0–15.0)
Immature Granulocytes: 0 %
Lymphocytes Relative: 45 %
Lymphs Abs: 1.9 10*3/uL (ref 0.7–4.0)
MCH: 34.9 pg — ABNORMAL HIGH (ref 26.0–34.0)
MCHC: 34.4 g/dL (ref 30.0–36.0)
MCV: 101.5 fL — ABNORMAL HIGH (ref 80.0–100.0)
Monocytes Absolute: 0.6 10*3/uL (ref 0.1–1.0)
Monocytes Relative: 15 %
Neutro Abs: 1.6 10*3/uL — ABNORMAL LOW (ref 1.7–7.7)
Neutrophils Relative %: 37 %
Platelet Count: 207 10*3/uL (ref 150–400)
RBC: 2.69 MIL/uL — ABNORMAL LOW (ref 3.87–5.11)
RDW: 13.3 % (ref 11.5–15.5)
WBC Count: 4.2 10*3/uL (ref 4.0–10.5)
nRBC: 0 % (ref 0.0–0.2)

## 2022-10-03 LAB — FERRITIN: Ferritin: 51 ng/mL (ref 11–307)

## 2022-10-03 LAB — MAGNESIUM: Magnesium: 1.6 mg/dL — ABNORMAL LOW (ref 1.7–2.4)

## 2022-10-03 MED ORDER — SODIUM CHLORIDE 0.9 % IV SOLN: Freq: Once | INTRAVENOUS | Status: AC

## 2022-10-03 MED ORDER — SODIUM CHLORIDE 0.9 % IV SOLN
6.0000 mg/kg | Freq: Once | INTRAVENOUS | Status: AC
  Administered 2022-10-03: 300 mg via INTRAVENOUS
  Filled 2022-10-03: qty 15

## 2022-10-03 NOTE — Progress Notes (Signed)
[facility]   [phone] [phone]   Clinic Follow up Note   Patient Care Team: [name], MD as PCP - General (Internal Medicine) Truitt Merle, MD as Consulting Physician (Hematology)  Date of Service:  10/03/2022  CHIEF COMPLAINT: f/u of Rectal cancer     CURRENT THERAPY:  Oral chemo Xeloda day 1-14 every 21 days, changed to 7 days on and 7 days off on 10/03/2022 IV Vectibix every 2 weeks    ASSESSMENT:  Tina Waller is a 82 y.o. female with   Rectal cancer ([place]) stage I c(T2, N0)M0, MSS, KRAS/NRAS/BRAF wild type  -diagnosed 08/2021 by colonoscopy for black stool. -pt declined surgery  --s/p concurrent chemoradiation with Xeloda 10/21/21 - 11/28/21. Tolerated well overall.  -due to residual disease after chemoRT, she started Xeloda and Vectibix on 06/09/2022.  She is tolerating moderately well overall. -Due to her worsening fatigue, patient would like to take a chemo break.   -CT scan from yesterday showed stable rectal cancer, no evidence of nodal or distant metastasis.  I personally reviewed the scan images and discussed the findings with patient.  She is quite pleased. -Given her significant fatigue, limited social support, we decided to proceed with chemo break for a month on last visit -She is clinically stable, does feel slightly better after chemo break.  She is willing to restart treatment. -Due to her advanced age, medical comorbidities, poor performance status, I will change her Xeloda to 7 days on and 7 days off.  COPD with hypoxia ([place]) -She was found to be hypoxic during her last visit on July 16, 2022.  She was sent to emergency room for evaluation.  CTA was negative for PE, but showed emphysema. -She has been seen by her primary care physician and was referred to pulmonary -She is on oxygen as needed.  Hypomagnesemia -Secondary to Vectibix -She is IV and oral mag     Goals of care, counseling/discussion -We  again discussed the incurable nature of her cancer, and the overall poor prognosis, especially if she does not have good response to chemotherapy or progress on chemo -The patient understands the goal of care is palliative. -I recommend DNR/DNI, she will think about it    PLAN: - lab reviewed. -Continue Xeloda 2 tablets in the morning and 2 tablets in the evening, will change to 7 days on and 7 days of - proceed with Vectibix today and continue every 2 weeks -lab/flush and f/u in 10/17/2022    SUMMARY OF ONCOLOGIC HISTORY: Oncology History Overview Note   Cancer Staging  Rectal cancer [facility]) Staging form: Colon and Rectum, AJCC 8th Edition - Clinical stage from 08/15/2021: Stage I (cT2, cN0, cM0) - Signed by Truitt Merle, MD on 09/05/2021 Stage prefix: Initial diagnosis Total positive nodes: 0     Rectal cancer ([place])  08/15/2021 Cancer Staging   Staging form: Colon and Rectum, AJCC 8th Edition - Clinical stage from 08/15/2021: Stage I (cT2, cN0, cM0) - Signed by Truitt Merle, MD on 09/05/2021 Stage prefix: Initial diagnosis Total positive nodes: 0   08/15/2021 Procedure   Colonoscopy, Dr. Rush Landmark  Impression: - Palpable rectal mass found on digital rectal exam. - The examined portion of the ileum was normal. - Four 3 to 7 mm polyps at the recto-sigmoid colon, in the descending colon and in the ascending colon, removed with a cold snare. Resected and retrieved. - Rule out malignancy, partially obstructing tumor in the proximal/middle rectum. Biopsied. Tattooed proximal  to lesion. - Diverticulosis in the entire examined colon. - Normal mucosa in the entire examined colon otherwise. - Non-bleeding non-thrombosed external and internal hemorrhoids.   08/15/2021 Initial Biopsy   FINAL MICROSCOPIC DIAGNOSIS:   A. STOMACH, BIOPSY:  - Gastric antral and oxyntic mucosa with nonspecific reactive  gastropathy  - Helicobacter pylori-like organisms are not identified on routine HE stain   B.  DUODENUM, BIOPSY:  - Duodenal mucosa with no specific histopathologic changes  - Negative for increased intraepithelial lymphocytes or villous  architectural changes   C. COLON, ASCENDING, DESCENDING AND SIGMOID, POLYPECTOMY:  - Tubular adenoma(s)  - Negative for high-grade dysplasia or malignancy   D. RECTAL MASS, BIOPSY:  - Fragments of an adenoma with multifocal high-grade dysplasia, focally suspicious for intramucosal carcinoma.  See comment    COMMENT:  D.  A deeper, more severe process cannot be ruled out.  Clinical and endoscopic correlation is suggested.    08/16/2021 Imaging   EXAM: CT CHEST, ABDOMEN, AND PELVIS WITH CONTRAST  IMPRESSION: 1. Large rectal mass. Two tiny hypodense lesions in the liver are nonspecific with regard to benign etiology or metastatic disease. 2. 9 mm in long axis hypodense pancreatic body lesion. Possibilities include postinflammatory cyst, intraductal papillary mucinous neoplasm, or less likely pancreatic adenocarcinoma. 3. Bilateral renal cysts. A complex right kidney upper pole lesion is indeterminate for complex cyst versus mass and measures 9 mm in diameter. 4. In light of these findings, consider upper abdominal MRI with contrast to further characterize the hepatic, pancreatic, and renal lesions. 5. There is also unusual left perirenal stranding especially adjacent to a cyst along the left kidney lower pole, this could be an indicator of inflammation involving the left kidney but is technically nonspecific. Some of this edema tracks along the left paracolic gutter. 6. Advanced systemic atherosclerosis. This includes aortic and coronary atherosclerosis along with mesenteric and renal atherosclerosis. If further workup of the patient's severe atherosclerotic vascular disease is warranted, CT angiography would be recommended. 7. Small bilateral pleural effusions with passive atelectasis. 8. Severe centrilobular emphysema.  Emphysema  (ICD10-J43.9). 9. Mild thoracolumbar scoliosis. 10. Scattered sigmoid colon diverticula.   08/18/2021 Imaging   EXAM: MRI ABDOMEN WITHOUT AND WITH CONTRAST (INCLUDING MRCP)  IMPRESSION: 1. Study is severely limited by beam hardening artifact from a large surgical clip in the stomach, which completely obscures the previously noted pancreatic lesion on most pulse sequences rendering today's study nondiagnostic. The only visualized hepatic lesion is compatible with a small simple hepatic cyst. 2. There is abnormal signal intensity and small rim enhancing lesions in the right psoas muscle. Clinical correlation for signs and symptoms of psoas abscess is recommended. Alternatively, the possibility of intramuscular metastasis could be considered. 3. Small bilateral pleural effusions lying dependently with areas of presumed passive atelectasis in the lower lobes of the lungs bilaterally. 4. Multiple simple cysts in the kidneys bilaterally. 5. Aortic atherosclerosis.   08/20/2021 Initial Diagnosis   Rectal cancer ([place])   08/21/2021 Imaging   EXAM: MRI PELVIS WITHOUT CONTRAST  IMPRESSION: Rectal adenocarcinoma T stage: T2   Rectal adenocarcinoma N stage:  N0   Distance from tumor to the internal anal sphincter is 4.5 cm.   06/09/2022 - 08/25/2022 Chemotherapy   Patient is on Treatment Plan : COLORECTAL Panitumumab q14d ([place] - Type Gene Only)     10/03/2022 -  Chemotherapy   Patient is on Treatment Plan : COLORECTAL Panitumumab q14d ([place] - Type Gene Only)  INTERVAL HISTORY:  Tina Waller is here for a follow up of Rectal cancer    She was last seen by me on 09/04/2022 . She presents to the clinic  alone. Pt state that she feels a little better since Chemo was held. Pt reports that her food taste a little better.Pt report of having pain in her hip. Pt state that it hurts when she is mobile. She take Tylenol for pain. Pt does use oxygen at home.    All other systems  were reviewed with the patient and are negative.  MEDICAL HISTORY:  Past Medical History:  Diagnosis Date   Anemia    Anxiety    Cancer ([place])    rectal   Cataract    Depression    Diabetes mellitus without complication ([place])    pt denies   GERD (gastroesophageal reflux disease)    Gout    Headache    Hyperlipidemia    Hypertension    Oxygen deficiency    wears oxygen at noc   Stroke [facility]) 2008   09/14/2018   Tonsillar mass    Type 2 diabetes mellitus ([place]) 09/14/2018   Vertigo    Wears dentures    upper   Wears glasses     SURGICAL HISTORY: Past Surgical History:  Procedure Laterality Date   ABDOMINAL HYSTERECTOMY     BIOPSY  08/15/2021   Procedure: BIOPSY;  Surgeon: Irving Copas., MD;  Location: [place];  Service: Gastroenterology;;   BIOPSY  04/17/2022   Procedure: BIOPSY;  Surgeon: Irving Copas., MD;  Location: [facility];  Service: Gastroenterology;;   BREAST SURGERY     lumpectomy   BUNIONECTOMY     CATARACT EXTRACTION Left    COLONOSCOPY     COLONOSCOPY W/ BIOPSIES AND POLYPECTOMY     COLONOSCOPY WITH PROPOFOL N/A 08/15/2021   Procedure: COLONOSCOPY WITH PROPOFOL;  Surgeon: Irving Copas., MD;  Location: [place];  Service: Gastroenterology;  Laterality: N/A;   ESOPHAGOGASTRODUODENOSCOPY (EGD) WITH PROPOFOL N/A 08/15/2021   Procedure: ESOPHAGOGASTRODUODENOSCOPY (EGD) WITH PROPOFOL;  Surgeon: Rush Landmark T[place]ord Nab., MD;  Location: [facility];  Service: Gastroenterology;  Laterality: N/A;   FLEXIBLE SIGMOIDOSCOPY N/A 04/17/2022   Procedure: FLEXIBLE SIGMOIDOSCOPY;  Surgeon: Rush Landmark Telford Nab., MD;  Location: [facility];  Service: Gastroenterology;  Laterality: N/A;   HEMOSTASIS CLIP PLACEMENT  08/15/2021   Procedure: HEMOSTASIS CLIP PLACEMENT;  Surgeon: Irving Copas., MD;  Location: [place];  Service: Gastroenterology;;   HOT HEMOSTASIS N/A 08/15/2021   Procedure: HOT HEMOSTASIS (ARGON PLASMA  COAGULATION/BICAP);  Surgeon: Irving Copas., MD;  Location: [place];  Service: Gastroenterology;  Laterality: N/A;   MULTIPLE TOOTH EXTRACTIONS     POLYPECTOMY  08/15/2021   Procedure: POLYPECTOMY;  Surgeon: Mansouraty, Telford Nab., MD;  Location: [facility];  Service: Gastroenterology;;   SUBMUCOSAL TATTOO INJECTION  08/15/2021   Procedure: SUBMUCOSAL TATTOO INJECTION;  Surgeon: Irving Copas., MD;  Location: [facility];  Service: Gastroenterology;;   TONGUE BIOPSY Right 12/13/2018   Procedure: RIGHT TONSIL BIOPSY;  Surgeon: Melida Quitter, MD;  Location: [facility];  Service: ENT;  Laterality: Right;   UPPER GASTROINTESTINAL ENDOSCOPY      I have reviewed the social history and family history with the patient and they are unchanged from previous note.  ALLERGIES:  is allergic to lisinopril, carvedilol, hydralazine, and lipitor [atorvastatin].  MEDICATIONS:  Current Outpatient Medications  Medication Sig Dispense Refill   acetaminophen (TYLENOL) 500 MG tablet Take 500-1,000 mg by  mouth every 6 (six) hours as needed for moderate pain or headache.     albuterol (VENTOLIN HFA) 108 (90 Base) MCG/ACT inhaler Inhale 2 puffs into the lungs every 6 (six) hours as needed for wheezing or shortness of breath.     amLODipine (NORVASC) 10 MG tablet Take 1 tablet (10 mg total) by mouth daily. 90 tablet 1   ammonium lactate (LAC-HYDRIN) 12 % lotion Apply 1 Application topically as needed for dry skin. 400 g 5   Budeson-Glycopyrrol-Formoterol (BREZTRI AEROSPHERE) 160-9-4.8 MCG/ACT AERO Inhale 2 puffs into the lungs 2 (two) times daily. 10.7 g 11   Budeson-Glycopyrrol-Formoterol (BREZTRI AEROSPHERE) 160-9-4.8 MCG/ACT AERO Inhale 2 puffs into the lungs in the morning and at bedtime. 5.9 g 0   capecitabine (XELODA) 500 MG tablet Take 2 tablets by mouth every 12 hours for 14 days, then off for 7 days. Repeat every 21 days. Take within 30 minutes after meals. 56 tablet 1   cholecalciferol  (VITAMIN D3) 25 MCG (1000 UNIT) tablet Take 2 tablets (2,000 Units total) by mouth daily. (Patient taking differently: Take 10,000 Units by mouth daily.) 100 tablet 1   clindamycin (CLINDAGEL) 1 % gel Apply topically 2 (two) times daily as needed. 30 g 2   clopidogrel (PLAVIX) 75 MG tablet Take 1 tablet (75 mg total) by mouth daily. 90 tablet 1   colchicine 0.6 MG tablet Take 1 tablet (0.6 mg total) by mouth daily. 14 tablet 0   ferrous gluconate (FERGON) 324 MG tablet Take 1 tablet (324 mg total) by mouth daily with breakfast. 30 tablet 0   furosemide (LASIX) 40 MG tablet TAKE 1 TABLET TWICE DAILY 180 tablet 1   isosorbide mononitrate (IMDUR) 60 MG 24 hr tablet TAKE 1 AND 1/2 TABLETS EVERY DAY 135 tablet 3   labetalol (NORMODYNE) 200 MG tablet Take 1 tablet (200 mg total) by mouth 2 (two) times daily. 180 tablet 2   losartan (COZAAR) 100 MG tablet TAKE 1 TABLET EVERY DAY 90 tablet 1   magnesium oxide (MAG-OX) 400 (240 Mg) MG tablet Take 1 tablet (400 mg total) by mouth 2 (two) times daily. 60 tablet 1   mometasone (ELOCON) 0.1 % ointment Apply topically daily. (Patient taking differently: Apply 1 Application topically daily as needed (Eczema).) 45 g 0   Multiple Vitamin (MULTIVITAMIN WITH MINERALS) TABS tablet Take 1 tablet by mouth daily. 30 tablet 0   ondansetron (ZOFRAN) 4 MG tablet Take 1 tablet (4 mg total) by mouth every 8 (eight) hours as needed for nausea or vomiting. 20 tablet 0   polyethylene glycol (MIRALAX) packet Take 17 g by mouth daily as needed for mild constipation.     potassium chloride (KLOR-CON M) 10 MEQ tablet Take 1 tablet (10 mEq total) by mouth 2 (two) times daily. 14 tablet 0   pravastatin (PRAVACHOL) 80 MG tablet TAKE 1 TABLET EVERY DAY FOR HIGH CHOLESTEROL 90 tablet 1   Spacer/Aero-Holding Chambers (AEROCHAMBER MV) inhaler Use as instructed 1 each 0   Spacer/Aero-Holding Chambers DEVI Use as directed 1 each 0   vitamin B-12 (CYANOCOBALAMIN) 1000 MCG tablet Take 1,000  mcg by mouth daily.     No current facility-administered medications for this visit.    PHYSICAL EXAMINATION: ECOG PERFORMANCE STATUS: 2 - Symptomatic, <50% confined to bed  Vitals:   10/03/22 1359  BP: 133/75  Pulse: 90  Resp: 17  Temp: 98 F (36.7 C)  SpO2: 98%   Wt Readings from Last 3 Encounters:  10/03/22 117  lb 4.8 oz (53.2 kg)  09/23/22 115 lb 6.4 oz (52.3 kg)  09/18/22 116 lb (52.6 kg)     GENERAL:alert, no distress and comfortable SKIN: skin color normal, no rashes or significant lesions EYES: normal, Conjunctiva are pink and non-injected, sclera clear  NEURO: alert & oriented x 3 with fluent speech    LABORATORY DATA:  I have reviewed the data as listed    Latest Ref Rng & Units 10/03/2022    1:38 PM 08/25/2022    8:40 AM 08/21/2022    1:17 PM  CBC  WBC 4.0 - 10.5 K/uL 4.2  3.5  4.0   Hemoglobin 12.0 - 15.0 g/dL 9.4  9.1  8.8   Hematocrit 36.0 - 46.0 % 27.3  27.4  25.5   Platelets 150 - 400 K/uL 207  180  137         Latest Ref Rng & Units 10/03/2022    1:38 PM 08/25/2022    8:40 AM 08/21/2022    1:17 PM  CMP  Glucose 70 - 99 mg/dL 116  106  124   BUN 8 - 23 mg/dL 26  25  27    Creatinine 0.44 - 1.00 mg/dL 1.44  1.59  1.53   Sodium 135 - 145 mmol/L 140  143  141   Potassium 3.5 - 5.1 mmol/L 4.5  4.4  4.6   Chloride 98 - 111 mmol/L 108  107  105   CO2 22 - 32 mmol/L 26  29  28    Calcium 8.9 - 10.3 mg/dL 9.4  9.9  9.5   Total Protein 6.5 - 8.1 g/dL 6.9  7.0  6.4   Total Bilirubin 0.3 - 1.2 mg/dL 0.4  0.7  0.8   Alkaline Phos 38 - 126 U/L 114  109  94   AST 15 - 41 U/L 15  14  13    ALT 0 - 44 U/L 9  7  6        RADIOGRAPHIC STUDIES: I have personally reviewed the radiological images as listed and agreed with the findings in the report. No results found.    No orders of the defined types were placed in this encounter.  All questions were answered. The patient knows to call the clinic with any problems, questions or concerns. No barriers to  learning was detected. The total time spent in the appointment was 30 minutes.     Truitt Merle, MD 10/03/2022   Felicity Coyer, CMA, am acting as scribe for Truitt Merle, MD.   I have reviewed the above documentation for accuracy and completeness, and I agree with the above.

## 2022-10-03 NOTE — Assessment & Plan Note (Signed)
-  Secondary to Vectibix -She is IV and oral mag  

## 2022-10-03 NOTE — Assessment & Plan Note (Signed)
-  She was found to be hypoxic during her last visit on July 16, 2022.  She was sent to emergency room for evaluation.  CTA was negative for PE, but showed emphysema. -She has been seen by her primary care physician and was referred to pulmonary -She is on oxygen as needed. 

## 2022-10-03 NOTE — Progress Notes (Signed)
Per Burr Medico, MD okay to treat with Mag 1.6.

## 2022-10-03 NOTE — Assessment & Plan Note (Signed)
-  We again discussed the incurable nature of her cancer, and the overall poor prognosis, especially if she does not have good response to chemotherapy or progress on chemo -The patient understands the goal of care is palliative. -I recommend DNR/DNI, she will think about it   

## 2022-10-03 NOTE — Patient Instructions (Signed)
[facility]  Discharge Instructions: Thank you for choosing [facility] to provide your oncology and hematology care.   If you have a lab appointment with the Cancer Center, please go directly to the Cancer Center and check in at the registration area.   Wear comfortable clothing and clothing appropriate for easy access to any Portacath or PICC line.   We strive to give you quality time with your provider. You may need to reschedule your appointment if you arrive late (15 or more minutes).  Arriving late affects you and other patients whose appointments are after yours.  Also, if you miss three or more appointments without notifying the office, you may be dismissed from the clinic at the provider's discretion.      For prescription refill requests, have your pharmacy contact our office and allow 72 hours for refills to be completed.    Today you received the following chemotherapy and/or immunotherapy agents: panitumumab      To help prevent nausea and vomiting after your treatment, we encourage you to take your nausea medication as directed.  BELOW ARE SYMPTOMS THAT SHOULD BE REPORTED IMMEDIATELY: *FEVER GREATER THAN 100.4 F (38 C) OR HIGHER *CHILLS OR SWEATING *NAUSEA AND VOMITING THAT IS NOT CONTROLLED WITH YOUR NAUSEA MEDICATION *UNUSUAL SHORTNESS OF BREATH *UNUSUAL BRUISING OR BLEEDING *URINARY PROBLEMS (pain or burning when urinating, or frequent urination) *BOWEL PROBLEMS (unusual diarrhea, constipation, pain near the anus) TENDERNESS IN MOUTH AND THROAT WITH OR WITHOUT PRESENCE OF ULCERS (sore throat, sores in mouth, or a toothache) UNUSUAL RASH, SWELLING OR PAIN  UNUSUAL VAGINAL DISCHARGE OR ITCHING   Items with * indicate a potential emergency and should be followed up as soon as possible or go to the Emergency Department if any problems should occur.  Please show the CHEMOTHERAPY ALERT CARD or IMMUNOTHERAPY ALERT CARD at  check-in to the Emergency Department and triage nurse.  Should you have questions after your visit or need to cancel or reschedule your appointment, please contact [facility]  Dept: [phone]  and follow the prompts.  Office hours are 8:00 a.m. to 4:30 p.m. Monday - Friday. Please note that voicemails left after 4:00 p.m. may not be returned until the following business day.  We are closed weekends and major holidays. You have access to a nurse at all times for urgent questions. Please call the main number to the clinic Dept: [phone] and follow the prompts.   For any non-urgent questions, you may also contact your provider using MyChart. We now offer e-Visits for anyone 18 and older to request care online for non-urgent symptoms. For details visit [email or web address].   Also download the MyChart app! Go to the app store, search "MyChart", open the app, select [facility], and log in with your MyChart username and password.   

## 2022-10-03 NOTE — Assessment & Plan Note (Signed)
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 -pt declined surgery  --s/p concurrent chemoradiation with Xeloda 10/21/21 - 11/28/21. Tolerated well overall.  -due to residual disease after chemoRT, she started Xeloda and Vectibix on 06/09/2022.  She is tolerating moderately well overall. -Due to her worsening fatigue, patient would like to take a chemo break.   -CT scan from yesterday showed stable rectal cancer, no evidence of nodal or distant metastasis.  I personally reviewed the scan images and discussed the findings with patient.  She is quite pleased. -Given her significant fatigue, limited social support, we decided to proceed with chemo break for a month

## 2022-10-06 ENCOUNTER — Ambulatory Visit: Payer: Medicare HMO

## 2022-10-06 ENCOUNTER — Other Ambulatory Visit: Payer: Medicare HMO

## 2022-10-06 ENCOUNTER — Ambulatory Visit: Payer: Medicare HMO | Admitting: Nurse Practitioner

## 2022-10-08 ENCOUNTER — Other Ambulatory Visit (HOSPITAL_COMMUNITY): Payer: Self-pay

## 2022-10-08 ENCOUNTER — Other Ambulatory Visit: Payer: Self-pay

## 2022-10-13 ENCOUNTER — Telehealth: Payer: Self-pay | Admitting: Internal Medicine

## 2022-10-13 NOTE — Telephone Encounter (Signed)
Called patient to schedule Medicare Annual Wellness Visit (AWV). Left message for patient to call back and schedule Medicare Annual Wellness Visit (AWV).  Last date of AWV: awvi 07/14/09 per [facility]     If any questions, please contact me at [phone].  Thank you ,  Barkley Boards AWV direct phone # [phone]

## 2022-10-17 ENCOUNTER — Other Ambulatory Visit: Payer: Self-pay

## 2022-10-17 ENCOUNTER — Inpatient Hospital Stay: Payer: Medicare HMO | Attending: Hematology

## 2022-10-17 ENCOUNTER — Encounter: Payer: Self-pay | Admitting: Hematology

## 2022-10-17 ENCOUNTER — Inpatient Hospital Stay (HOSPITAL_BASED_OUTPATIENT_CLINIC_OR_DEPARTMENT_OTHER): Payer: Medicare HMO | Admitting: Hematology

## 2022-10-17 ENCOUNTER — Telehealth: Payer: Self-pay

## 2022-10-17 ENCOUNTER — Other Ambulatory Visit (HOSPITAL_COMMUNITY): Payer: Self-pay

## 2022-10-17 ENCOUNTER — Inpatient Hospital Stay: Payer: Medicare HMO

## 2022-10-17 VITALS — BP 137/65 | HR 64 | Temp 97.8°F | Resp 15 | Ht 69.0 in | Wt 113.7 lb

## 2022-10-17 DIAGNOSIS — J449 Chronic obstructive pulmonary disease, unspecified: Secondary | ICD-10-CM | POA: Insufficient documentation

## 2022-10-17 DIAGNOSIS — C20 Malignant neoplasm of rectum: Secondary | ICD-10-CM

## 2022-10-17 DIAGNOSIS — Z7189 Other specified counseling: Secondary | ICD-10-CM | POA: Diagnosis not present

## 2022-10-17 DIAGNOSIS — Z79899 Other long term (current) drug therapy: Secondary | ICD-10-CM | POA: Diagnosis not present

## 2022-10-17 DIAGNOSIS — Z923 Personal history of irradiation: Secondary | ICD-10-CM | POA: Insufficient documentation

## 2022-10-17 DIAGNOSIS — R109 Unspecified abdominal pain: Secondary | ICD-10-CM | POA: Diagnosis not present

## 2022-10-17 LAB — CBC WITH DIFFERENTIAL (CANCER CENTER ONLY)
Abs Immature Granulocytes: 0.01 10*3/uL (ref 0.00–0.07)
Basophils Absolute: 0 10*3/uL (ref 0.0–0.1)
Basophils Relative: 0 %
Eosinophils Absolute: 0.2 10*3/uL (ref 0.0–0.5)
Eosinophils Relative: 6 %
HCT: 28.5 % — ABNORMAL LOW (ref 36.0–46.0)
Hemoglobin: 9.7 g/dL — ABNORMAL LOW (ref 12.0–15.0)
Immature Granulocytes: 0 %
Lymphocytes Relative: 42 %
Lymphs Abs: 1.6 10*3/uL (ref 0.7–4.0)
MCH: 33.8 pg (ref 26.0–34.0)
MCHC: 34 g/dL (ref 30.0–36.0)
MCV: 99.3 fL (ref 80.0–100.0)
Monocytes Absolute: 0.6 10*3/uL (ref 0.1–1.0)
Monocytes Relative: 15 %
Neutro Abs: 1.4 10*3/uL — ABNORMAL LOW (ref 1.7–7.7)
Neutrophils Relative %: 37 %
Platelet Count: 180 10*3/uL (ref 150–400)
RBC: 2.87 MIL/uL — ABNORMAL LOW (ref 3.87–5.11)
RDW: 12.1 % (ref 11.5–15.5)
WBC Count: 3.8 10*3/uL — ABNORMAL LOW (ref 4.0–10.5)
nRBC: 0 % (ref 0.0–0.2)

## 2022-10-17 LAB — CMP (CANCER CENTER ONLY)
ALT: 9 U/L (ref 0–44)
AST: 15 U/L (ref 15–41)
Albumin: 4 g/dL (ref 3.5–5.0)
Alkaline Phosphatase: 114 U/L (ref 38–126)
Anion gap: 7 (ref 5–15)
BUN: 28 mg/dL — ABNORMAL HIGH (ref 8–23)
CO2: 26 mmol/L (ref 22–32)
Calcium: 9.7 mg/dL (ref 8.9–10.3)
Chloride: 107 mmol/L (ref 98–111)
Creatinine: 1.47 mg/dL — ABNORMAL HIGH (ref 0.44–1.00)
GFR, Estimated: 36 mL/min — ABNORMAL LOW (ref >60)
Glucose, Bld: 102 mg/dL — ABNORMAL HIGH (ref 70–99)
Potassium: 4.8 mmol/L (ref 3.5–5.1)
Sodium: 140 mmol/L (ref 135–145)
Total Bilirubin: 0.5 mg/dL (ref 0.3–1.2)
Total Protein: 7.1 g/dL (ref 6.5–8.1)

## 2022-10-17 LAB — IRON AND IRON BINDING CAPACITY (CC-WL,HP ONLY)
Iron: 72 ug/dL (ref 28–170)
Saturation Ratios: 24 % (ref 10.4–31.8)
TIBC: 302 ug/dL (ref 250–450)
UIBC: 230 ug/dL (ref 148–442)

## 2022-10-17 LAB — MAGNESIUM: Magnesium: 1.5 mg/dL — ABNORMAL LOW (ref 1.7–2.4)

## 2022-10-17 LAB — FERRITIN: Ferritin: 57 ng/mL (ref 11–307)

## 2022-10-17 MED ORDER — TRAMADOL HCL 50 MG PO TABS: 50.0000 mg | ORAL_TABLET | Freq: Three times a day (TID) | ORAL | 0 refills | Status: AC | PRN

## 2022-10-17 NOTE — Telephone Encounter (Signed)
Placed a referral for [facility]. Messaged Haynes Bast with Hospice to make her aware.

## 2022-10-17 NOTE — Assessment & Plan Note (Signed)
-  She was found to be hypoxic during her last visit on July 16, 2022.  She was sent to emergency room for evaluation.  CTA was negative for PE, but showed emphysema. -She has been seen by her primary care physician and was referred to pulmonary -She is on oxygen as needed. 

## 2022-10-17 NOTE — Assessment & Plan Note (Signed)
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 -pt declined surgery  --s/p concurrent chemoradiation with Xeloda 10/21/21 - 11/28/21. Tolerated well overall.  -due to residual disease after chemoRT, she started Xeloda and Vectibix on 06/09/2022.  She is tolerating moderately well overall. -Due to her worsening fatigue, patient would like to take a chemo break.   -CT scan from yesterday showed stable rectal cancer, no evidence of nodal or distant metastasis.  I personally reviewed the scan images and discussed the findings with patient.  She is quite pleased. -Given her significant fatigue, limited social support, we decided to proceed with chemo break for a month on last visit -She is clinically stable, does feel slightly better after chemo break.  She is willing to restart treatment. -Due to her advanced age, medical comorbidities, poor performance status, I will change her Xeloda to 7 days on and 7 days off.

## 2022-10-17 NOTE — Progress Notes (Addendum)
[facility]   [phone] [phone]   Clinic Follow up Note   Patient Care Team: Marcine MatarJohnson, Deborah B, MD as PCP - General (Internal Medicine) Malachy MoodFeng, Riata Ikeda, MD as Consulting Physician (Hematology)  Date of Service:  10/17/2022  CHIEF COMPLAINT: f/u of Rectal cancer    CURRENT THERAPY:   Oral chemo Xeloda day 1-14 every 21 days, changed to 7 days on and 7 days off on 10/03/2022 IV Vectibix every 2 weeks  ASSESSMENT:  Tina Waller is a 82 y.o. female with   Rectal cancer (HCC) stage I c(T2, N0)M0, MSS, KRAS/NRAS/BRAF wild type  -diagnosed 08/2021 by colonoscopy for black stool. -pt declined surgery  --s/p concurrent chemoradiation with Xeloda 10/21/21 - 11/28/21. Tolerated well overall.  -due to residual disease after chemoRT, she started Xeloda and Vectibix on 06/09/2022.  She is tolerating moderately well overall. -Due to her worsening fatigue, patient would like to take a chemo break.   -CT scan from yesterday showed stable rectal cancer, no evidence of nodal or distant metastasis.  I personally reviewed the scan images and discussed the findings with patient.  She is quite pleased. -Given her significant fatigue, limited social support, we decided to proceed with chemo break for a month in Feb 2024 -She felt better after chemo break, we discussed restarting Xeloda on last visit, but she decided not to resume due to the concern of side effect. -She is not a candidate for other chemotherapy due to her advanced age and limited performance status. -She has developed abdominal pain, fatigue, and low appetite lately.  Probably cancer related.  I suspect that she may have metastatic disease now.  Given her poor performance status, especially low appetite, I anticipate her life expectancy is less than 6 months. -I recommend Perative care alone and hospice, I discussed gust the logistics and benefit of hospice home care, she is interested, I referred her  today.  COPD with hypoxia (HCC) -She was found to be hypoxic during her last visit on July 16, 2022.  She was sent to emergency room for evaluation.  CTA was negative for PE, but showed emphysema. -She has been seen by her primary care physician and was referred to pulmonary -She is on oxygen as needed.  Hypomagnesemia -Secondary to Vectibix -She is IV and oral mag     Goals of care, counseling/discussion -We again discussed the incurable nature of her cancer, and the overall poor prognosis, especially if she does not have good response to chemotherapy or progress on chemo -The patient understands the goal of care is palliative. -I recommend DNR/DNI, she agreed today.   Abdominal pain -Likely related to her cancer -I called in tramadol for her today, we discussed management of constipation    PLAN: -prescribe Tramadol -discuss Hospice w/pt, she agrees to transition to hospice home care.  Referral made today. -Cancel treatment and future appointments. -Will remain to be her attending when she is under hospice care. -will copy her PCP and other doctors    SUMMARY OF ONCOLOGIC HISTORY: Oncology History Overview Note   Cancer Staging  Rectal cancer [facility](HCC) Staging form: Colon and Rectum, AJCC 8th Edition - Clinical stage from 08/15/2021: Stage I (cT2, cN0, cM0) - Signed by Malachy MoodFeng, Nerida Boivin, MD on 09/05/2021 Stage prefix: Initial diagnosis Total positive nodes: 0     Rectal cancer  08/15/2021 Cancer Staging   Staging form: Colon and Rectum, AJCC 8th Edition - Clinical stage from 08/15/2021: Stage I (cT2, cN0, cM0) -  Signed by Malachy MoodFeng, Hayli Milligan, MD on 09/05/2021 Stage prefix: Initial diagnosis Total positive nodes: 0   08/15/2021 Procedure   Colonoscopy, Dr. Meridee ScoreMansouraty  Impression: - Palpable rectal mass found on digital rectal exam. - The examined portion of the ileum was normal. - Four 3 to 7 mm polyps at the recto-sigmoid colon, in the descending colon and in the ascending colon, removed  with a cold snare. Resected and retrieved. - Rule out malignancy, partially obstructing tumor in the proximal/middle rectum. Biopsied. Tattooed proximal to lesion. - Diverticulosis in the entire examined colon. - Normal mucosa in the entire examined colon otherwise. - Non-bleeding non-thrombosed external and internal hemorrhoids.   08/15/2021 Initial Biopsy   FINAL MICROSCOPIC DIAGNOSIS:   A. STOMACH, BIOPSY:  - Gastric antral and oxyntic mucosa with nonspecific reactive  gastropathy  - Helicobacter pylori-like organisms are not identified on routine HE stain   B. DUODENUM, BIOPSY:  - Duodenal mucosa with no specific histopathologic changes  - Negative for increased intraepithelial lymphocytes or villous  architectural changes   C. COLON, ASCENDING, DESCENDING AND SIGMOID, POLYPECTOMY:  - Tubular adenoma(s)  - Negative for high-grade dysplasia or malignancy   D. RECTAL MASS, BIOPSY:  - Fragments of an adenoma with multifocal high-grade dysplasia, focally suspicious for intramucosal carcinoma.  See comment    COMMENT:  D.  A deeper, more severe process cannot be ruled out.  Clinical and endoscopic correlation is suggested.    08/16/2021 Imaging   EXAM: CT CHEST, ABDOMEN, AND PELVIS WITH CONTRAST  IMPRESSION: 1. Large rectal mass. Two tiny hypodense lesions in the liver are nonspecific with regard to benign etiology or metastatic disease. 2. 9 mm in long axis hypodense pancreatic body lesion. Possibilities include postinflammatory cyst, intraductal papillary mucinous neoplasm, or less likely pancreatic adenocarcinoma. 3. Bilateral renal cysts. A complex right kidney upper pole lesion is indeterminate for complex cyst versus mass and measures 9 mm in diameter. 4. In light of these findings, consider upper abdominal MRI with contrast to further characterize the hepatic, pancreatic, and renal lesions. 5. There is also unusual left perirenal stranding especially adjacent to a cyst  along the left kidney lower pole, this could be an indicator of inflammation involving the left kidney but is technically nonspecific. Some of this edema tracks along the left paracolic gutter. 6. Advanced systemic atherosclerosis. This includes aortic and coronary atherosclerosis along with mesenteric and renal atherosclerosis. If further workup of the patient's severe atherosclerotic vascular disease is warranted, CT angiography would be recommended. 7. Small bilateral pleural effusions with passive atelectasis. 8. Severe centrilobular emphysema.  Emphysema (ICD10-J43.9). 9. Mild thoracolumbar scoliosis. 10. Scattered sigmoid colon diverticula.   08/18/2021 Imaging   EXAM: MRI ABDOMEN WITHOUT AND WITH CONTRAST (INCLUDING MRCP)  IMPRESSION: 1. Study is severely limited by beam hardening artifact from a large surgical clip in the stomach, which completely obscures the previously noted pancreatic lesion on most pulse sequences rendering today's study nondiagnostic. The only visualized hepatic lesion is compatible with a small simple hepatic cyst. 2. There is abnormal signal intensity and small rim enhancing lesions in the right psoas muscle. Clinical correlation for signs and symptoms of psoas abscess is recommended. Alternatively, the possibility of intramuscular metastasis could be considered. 3. Small bilateral pleural effusions lying dependently with areas of presumed passive atelectasis in the lower lobes of the lungs bilaterally. 4. Multiple simple cysts in the kidneys bilaterally. 5. Aortic atherosclerosis.   08/20/2021 Initial Diagnosis   Rectal cancer (HCC)   08/21/2021 Imaging  EXAM: MRI PELVIS WITHOUT CONTRAST  IMPRESSION: Rectal adenocarcinoma T stage: T2   Rectal adenocarcinoma N stage:  N0   Distance from tumor to the internal anal sphincter is 4.5 cm.   06/09/2022 - 08/25/2022 Chemotherapy   Patient is on Treatment Plan : COLORECTAL Panitumumab q14d ([facility] -  Type Gene Only)     10/03/2022 -  Chemotherapy   Patient is on Treatment Plan : COLORECTAL Panitumumab q14d ([facility] - Type Gene Only)        INTERVAL HISTORY:  KEYERRA LAMERE is here for a follow up of v She was last seen by me on 10/03/2022 She presents to the clinic alone. Pt state that she stays thirsty and that she has pain in her middle region.t state that she is losing weight. Pt stopped the Xeloda 09/2022     All other systems were reviewed with the patient and are negative.  MEDICAL HISTORY:  Past Medical History:  Diagnosis Date   Anemia    Anxiety    Cancer    rectal   Cataract    Depression    Diabetes mellitus without complication    pt denies   GERD (gastroesophageal reflux disease)    Gout    Headache    Hyperlipidemia    Hypertension    Oxygen deficiency    wears oxygen at noc   Stroke 2008   09/14/2018   Tonsillar mass    Type 2 diabetes mellitus 09/14/2018   Vertigo    Wears dentures    upper   Wears glasses     SURGICAL HISTORY: Past Surgical History:  Procedure Laterality Date   ABDOMINAL HYSTERECTOMY     BIOPSY  08/15/2021   Procedure: BIOPSY;  Surgeon: Lemar Lofty., MD;  Location[facility] ENDOSCOPY;  Service: Gastroenterology;;   BIOPSY  04/17/2022   Procedure: BIOPSY;  Surgeon: Lemar Lofty., MD;  Location: [facility] ENDOSCOPY;  Service: Gastroenterology;;   BREAST SURGERY     lumpectomy   BUNIONECTOMY     CATARACT EXTRACTION Left    COLONOSCOPY     COLONOSCOPY W/ BIOPSIES AND POLYPECTOMY     COLONOSCOPY WITH PROPOFOL N/A 08/15/2021   Procedure: COLONOSCOPY WITH PROPOFOL;  Surgeon: Lemar Lofty., MD;  Location: [facility] ENDOSCOPY;  Service: Gastroenterology;  Laterality: N/A;   ESOPHAGOGASTRODUODENOSCOPY (EGD) WITH PROPOFOL N/A 08/15/2021   Procedure: ESOPHAGOGASTRODUODENOSCOPY (EGD) WITH PROPOFOL;  Surgeon: Meridee Score Netty Starring., MD;  Location: [facility] ENDOSCOPY;  Service: Gastroenterology;  Laterality: N/A;   FLEXIBLE  SIGMOIDOSCOPY N/A 04/17/2022   Procedure: FLEXIBLE SIGMOIDOSCOPY;  Surgeon: Meridee Score Netty Starring., MD;  Location: [facility] ENDOSCOPY;  Service: Gastroenterology;  Laterality: N/A;   HEMOSTASIS CLIP PLACEMENT  08/15/2021   Procedure: HEMOSTASIS CLIP PLACEMENT;  Surgeon: Lemar Lofty., MD;  Location: [facility] ENDOSCOPY;  Service: Gastroenterology;;   HOT HEMOSTASIS N/A 08/15/2021   Procedure: HOT HEMOSTASIS (ARGON PLASMA COAGULATION/BICAP);  Surgeon: Lemar Lofty., MD;  Location: [facility] ENDOSCOPY;  Service: Gastroenterology;  Laterality: N/A;   MULTIPLE TOOTH EXTRACTIONS     POLYPECTOMY  08/15/2021   Procedure: POLYPECTOMY;  Surgeon: Mansouraty, Netty Starring., MD;  Location: [facility] ENDOSCOPY;  Service: Gastroenterology;;   SUBMUCOSAL TATTOO INJECTION  08/15/2021   Procedure: SUBMUCOSAL TATTOO INJECTION;  Surgeon: Lemar Lofty., MD;  Location: [facility] ENDOSCOPY;  Service: Gastroenterology;;   TONGUE BIOPSY Right 12/13/2018   Procedure: RIGHT TONSIL BIOPSY;  Surgeon: Christia Reading, MD;  Location: [facility] OR;  Service: ENT;  Laterality: Right;   UPPER GASTROINTESTINAL ENDOSCOPY  I have reviewed the social history and family history with the patient and they are unchanged from previous note.  ALLERGIES:  is allergic to lisinopril, carvedilol, hydralazine, and lipitor [atorvastatin].  MEDICATIONS:  Current Outpatient Medications  Medication Sig Dispense Refill   traMADol (ULTRAM) 50 MG tablet Take 1 tablet (50 mg total) by mouth every 8 (eight) hours as needed. 20 tablet 0   acetaminophen (TYLENOL) 500 MG tablet Take 500-1,000 mg by mouth every 6 (six) hours as needed for moderate pain or headache.     albuterol (VENTOLIN HFA) 108 (90 Base) MCG/ACT inhaler Inhale 2 puffs into the lungs every 6 (six) hours as needed for wheezing or shortness of breath.     amLODipine (NORVASC) 10 MG tablet Take 1 tablet (10 mg total) by mouth daily. 90 tablet 1   ammonium lactate (LAC-HYDRIN) 12 % lotion Apply  1 Application topically as needed for dry skin. 400 g 5   Budeson-Glycopyrrol-Formoterol (BREZTRI AEROSPHERE) 160-9-4.8 MCG/ACT AERO Inhale 2 puffs into the lungs 2 (two) times daily. 10.7 g 11   Budeson-Glycopyrrol-Formoterol (BREZTRI AEROSPHERE) 160-9-4.8 MCG/ACT AERO Inhale 2 puffs into the lungs in the morning and at bedtime. 5.9 g 0   capecitabine (XELODA) 500 MG tablet Take 2 tablets by mouth every 12 hours for 14 days, then off for 7 days. Repeat every 21 days. Take within 30 minutes after meals. 56 tablet 1   cholecalciferol (VITAMIN D3) 25 MCG (1000 UNIT) tablet Take 2 tablets (2,000 Units total) by mouth daily. (Patient taking differently: Take 10,000 Units by mouth daily.) 100 tablet 1   clindamycin (CLINDAGEL) 1 % gel Apply topically 2 (two) times daily as needed. 30 g 2   clopidogrel (PLAVIX) 75 MG tablet Take 1 tablet (75 mg total) by mouth daily. 90 tablet 1   colchicine 0.6 MG tablet Take 1 tablet (0.6 mg total) by mouth daily. 14 tablet 0   ferrous gluconate (FERGON) 324 MG tablet Take 1 tablet (324 mg total) by mouth daily with breakfast. 30 tablet 0   furosemide (LASIX) 40 MG tablet TAKE 1 TABLET TWICE DAILY 180 tablet 1   isosorbide mononitrate (IMDUR) 60 MG 24 hr tablet TAKE 1 AND 1/2 TABLETS EVERY DAY 135 tablet 3   labetalol (NORMODYNE) 200 MG tablet Take 1 tablet (200 mg total) by mouth 2 (two) times daily. 180 tablet 2   losartan (COZAAR) 100 MG tablet TAKE 1 TABLET EVERY DAY 90 tablet 1   magnesium oxide (MAG-OX) 400 (240 Mg) MG tablet Take 1 tablet (400 mg total) by mouth 2 (two) times daily. 60 tablet 1   mometasone (ELOCON) 0.1 % ointment Apply topically daily. (Patient taking differently: Apply 1 Application topically daily as needed (Eczema).) 45 g 0   Multiple Vitamin (MULTIVITAMIN WITH MINERALS) TABS tablet Take 1 tablet by mouth daily. 30 tablet 0   ondansetron (ZOFRAN) 4 MG tablet Take 1 tablet (4 mg total) by mouth every 8 (eight) hours as needed for nausea or  vomiting. 20 tablet 0   polyethylene glycol (MIRALAX) packet Take 17 g by mouth daily as needed for mild constipation.     potassium chloride (KLOR-CON M) 10 MEQ tablet Take 1 tablet (10 mEq total) by mouth 2 (two) times daily. 14 tablet 0   pravastatin (PRAVACHOL) 80 MG tablet TAKE 1 TABLET EVERY DAY FOR HIGH CHOLESTEROL 90 tablet 1   Spacer/Aero-Holding Chambers (AEROCHAMBER MV) inhaler Use as instructed 1 each 0   Spacer/Aero-Holding Rudean Curt Use as directed 1  each 0   vitamin B-12 (CYANOCOBALAMIN) 1000 MCG tablet Take 1,000 mcg by mouth daily.     No current facility-administered medications for this visit.    PHYSICAL EXAMINATION: ECOG PERFORMANCE STATUS: 3 - Symptomatic, >50% confined to bed  Vitals:   10/17/22 1402  BP: 137/65  Pulse: 64  Resp: 15  Temp: 97.8 F (36.6 C)  SpO2: 97%   Wt Readings from Last 3 Encounters:  10/17/22 113 lb 11.2 oz (51.6 kg)  10/03/22 117 lb 4.8 oz (53.2 kg)  09/23/22 115 lb 6.4 oz (52.3 kg)     GENERAL:alert, no distress and comfortable SKIN: skin color normal, no rashes or significant lesions EYES: normal, Conjunctiva are pink and non-injected, sclera clear  NEURO: alert & oriented x 3 with fluent speech    LABORATORY DATA:  I have reviewed the data as listed    Latest Ref Rng & Units 10/17/2022    1:44 PM 10/03/2022    1:38 PM 08/25/2022    8:40 AM  CBC  WBC 4.0 - 10.5 K/uL 3.8  4.2  3.5   Hemoglobin 12.0 - 15.0 g/dL 9.7  9.4  9.1   Hematocrit 36.0 - 46.0 % 28.5  27.3  27.4   Platelets 150 - 400 K/uL 180  207  180         Latest Ref Rng & Units 10/17/2022    1:44 PM 10/03/2022    1:38 PM 08/25/2022    8:40 AM  CMP  Glucose 70 - 99 mg/dL 161  096  045   BUN 8 - 23 mg/dL 28  26  25    Creatinine 0.44 - 1.00 mg/dL 4.09  8.11  9.14   Sodium 135 - 145 mmol/L 140  140  143   Potassium 3.5 - 5.1 mmol/L 4.8  4.5  4.4   Chloride 98 - 111 mmol/L 107  108  107   CO2 22 - 32 mmol/L 26  26  29    Calcium 8.9 - 10.3 mg/dL 9.7  9.4   9.9   Total Protein 6.5 - 8.1 g/dL 7.1  6.9  7.0   Total Bilirubin 0.3 - 1.2 mg/dL 0.5  0.4  0.7   Alkaline Phos 38 - 126 U/L 114  114  109   AST 15 - 41 U/L 15  15  14    ALT 0 - 44 U/L 9  9  7        RADIOGRAPHIC STUDIES: I have personally reviewed the radiological images as listed and agreed with the findings in the report. No results found.    No orders of the defined types were placed in this encounter.  All questions were answered. The patient knows to call the clinic with any problems, questions or concerns. No barriers to learning was detected. The total time spent in the appointment was 30 minutes.     Malachy Mood, MD 10/17/2022   Carolin Coy, CMA, am acting as scribe for Malachy Mood, MD.   I have reviewed the above documentation for accuracy and completeness, and I agree with the above.

## 2022-10-17 NOTE — Assessment & Plan Note (Signed)
-  We again discussed the incurable nature of her cancer, and the overall poor prognosis, especially if she does not have good response to chemotherapy or progress on chemo -The patient understands the goal of care is palliative. -I recommend DNR/DNI, she will think about it   

## 2022-10-17 NOTE — Assessment & Plan Note (Signed)
-  Secondary to Vectibix -She is IV and oral mag  

## 2022-10-20 ENCOUNTER — Other Ambulatory Visit (HOSPITAL_COMMUNITY): Payer: Self-pay

## 2022-10-20 ENCOUNTER — Ambulatory Visit: Payer: Medicare HMO

## 2022-10-20 ENCOUNTER — Ambulatory Visit: Payer: Medicare HMO | Admitting: Hematology

## 2022-10-20 ENCOUNTER — Other Ambulatory Visit: Payer: Medicare HMO

## 2022-10-22 ENCOUNTER — Ambulatory Visit: Payer: Self-pay

## 2022-10-22 NOTE — Patient Outreach (Signed)
  Care Coordination   Follow Up Visit Note   10/22/2022 Name: Tina Waller MRN: [id number] DOB: 09/28/1940  Tina Waller is a 82 y.o. year old female who sees Marcine Matar, MD for primary care. I spoke with  Tina Waller by phone today.  What matters to the patients health and wellness today?  Patient is currently under the care of Hospice.     Goals Addressed             This Visit's Progress    COMPLETED: To improve fatigue and nutrition       Care Coordination Interventions: Assessed patient understanding of cancer diagnosis and recommended treatment plan Reviewed upcoming provider appointments and treatment appointments Determined patient is currently under the care of Hospice  Discussed with patient her understanding of Hospice Care Discussed with patient Dr. Mosetta Putt, Oncologist will continue to follow while under Hospice Confirmed patient has the contact number for the Hospice team if needed Instructed patient to contact her Hospice provider and or Dr. Mosetta Putt for new symptoms or concerns Discussed with patient closure from [facility] Care Coordination while under the care of Hospice and patient verbalizes understanding     Interventions Today    Flowsheet Row Most Recent Value  Chronic Disease   Chronic disease during today's visit Other  [Cancer]  General Interventions   General Interventions Discussed/Reviewed General Interventions Discussed, General Interventions Reviewed, Doctor Visits  Doctor Visits Discussed/Reviewed Specialist  Education Interventions   Education Provided Provided Education  Mental Health Interventions   Mental Health Discussed/Reviewed Mental Health Discussed, Mental Health Reviewed  Advanced Directive Interventions   Advanced Directives Discussed/Reviewed End of Life  End of Life Hospice          SDOH assessments and interventions completed:  No     Care Coordination Interventions:  Yes, provided   Follow up plan: No  further intervention required.   Encounter Outcome:  Pt. Visit Completed

## 2022-10-22 NOTE — Patient Instructions (Signed)
Visit Information  Thank you for taking time to visit with me today. Please don't hesitate to contact me if I can be of assistance to you.   Following are the goals we discussed today:   Goals Addressed             This Visit's Progress    COMPLETED: To improve fatigue and nutrition       Care Coordination Interventions: Assessed patient understanding of cancer diagnosis and recommended treatment plan Reviewed upcoming provider appointments and treatment appointments Determined patient is currently under the care of Hospice  Discussed with patient her understanding of Hospice Care Discussed with patient Dr. Mosetta Putt, Oncologist will continue to follow while under Hospice Confirmed patient has the contact number for the Hospice team if needed Instructed patient to contact her Hospice provider and or Dr. Mosetta Putt for new symptoms or concerns Discussed with patient closure from [facility] Care Coordination while under the care of Hospice and patient verbalizes understanding            If you are experiencing a Mental Health or Behavioral Health Crisis or need someone to talk to, please call 1-800-273-TALK (toll free, 24 hour hotline) go to [facility] [address] [place] [phone])  The patient verbalized understanding of instructions, educational materials, and care plan provided today and DECLINED offer to receive copy of patient instructions, educational materials, and care plan.   Delsa Sale, RN, BSN, CCM Care Management Coordinator [facility] Care Management  Direct Phone: [phone]

## 2022-10-24 ENCOUNTER — Ambulatory Visit: Payer: Medicare HMO | Admitting: Internal Medicine

## 2022-10-31 ENCOUNTER — Inpatient Hospital Stay: Payer: Medicare HMO

## 2022-10-31 ENCOUNTER — Inpatient Hospital Stay: Payer: Medicare HMO | Admitting: Hematology

## 2022-11-14 ENCOUNTER — Ambulatory Visit: Payer: Medicare HMO | Admitting: Hematology

## 2022-11-14 ENCOUNTER — Other Ambulatory Visit: Payer: Medicare HMO

## 2022-11-14 ENCOUNTER — Ambulatory Visit: Payer: Medicare HMO

## 2022-11-21 ENCOUNTER — Telehealth: Payer: Self-pay | Admitting: Internal Medicine

## 2022-11-21 NOTE — Telephone Encounter (Signed)
Contacted Tina Waller to schedule their annual wellness visit. Patient declined to schedule AWV at this time.  I spoke to patient. Patient's in hospice care and won't be coming back to the office.  Thank you,  [facility] Support [facility] Direct dial  [phone]

## 2022-11-29 ENCOUNTER — Encounter: Payer: Self-pay | Admitting: Hematology

## 2022-12-03 ENCOUNTER — Ambulatory Visit (INDEPENDENT_AMBULATORY_CARE_PROVIDER_SITE_OTHER): Payer: Medicare Other | Admitting: Podiatry

## 2022-12-03 ENCOUNTER — Encounter: Payer: Self-pay | Admitting: Podiatry

## 2022-12-03 DIAGNOSIS — M79675 Pain in left toe(s): Secondary | ICD-10-CM | POA: Diagnosis not present

## 2022-12-03 DIAGNOSIS — Q828 Other specified congenital malformations of skin: Secondary | ICD-10-CM

## 2022-12-03 DIAGNOSIS — M79674 Pain in right toe(s): Secondary | ICD-10-CM

## 2022-12-03 DIAGNOSIS — D689 Coagulation defect, unspecified: Secondary | ICD-10-CM | POA: Diagnosis not present

## 2022-12-03 DIAGNOSIS — B351 Tinea unguium: Secondary | ICD-10-CM

## 2022-12-03 NOTE — Progress Notes (Signed)
  Subjective:  Patient ID: Tina Waller, female    DOB: Sep 17, 1940,  MRN: [id number]  Tina Waller presents to clinic today for at risk foot care with h/o coagulation defect and painful porokeratotic lesion(s) left lower extremity and painful mycotic toenails that limit ambulation. Painful toenails interfere with ambulation. Aggravating factors include wearing enclosed shoe gear. Pain is relieved with periodic professional debridement. Painful porokeratotic lesions are aggravated when weightbearing with and without shoegear. Pain is relieved with periodic professional debridement.  Chief Complaint  Patient presents with   Nail Problem    RFC/ Concerns about edema PCP-[facility] PCP VST-Early 2024   New problem(s): None.   PCP is No primary care provider on file..  Allergies  Allergen Reactions   Lisinopril Cough   Carvedilol Diarrhea   Hydralazine Diarrhea and Nausea And Vomiting   Lipitor [Atorvastatin] Diarrhea    Review of Systems: Negative except as noted in the HPI.  Objective: No changes noted in today's physical examination. There were no vitals filed for this visit. Tina Waller is a pleasant 82 y.o. female WD, WN in NAD. AAO x 3.  Vascular Examination: Capillary refill time immediate b/l. Vascular status intact b/l with faintly palpable pedal pulses. Pedal hair present b/l. No edema. No pain with calf compression b/l. Skin temperature gradient WNL b/l.   Neurological Examination: Sensation grossly intact b/l with 10 gram monofilament. Vibratory sensation intact b/l.   Dermatological Examination: Pedal skin with normal turgor, texture and tone b/l. Toenails 1-5 b/l thick, discolored, elongated with subungual debris and pain on dorsal palpation. Pedal skin noted to be dry b/l lower extremities.  Porokeratotic lesion(s) bilateral great toes and submet head 2 b/l. No erythema, no edema, no drainage, no fluctuance.  Musculoskeletal Examination: Normal muscle  strength 5/5 to all lower extremity muscle groups bilaterally. HAV with bunion bilaterally and hammertoes 2-5 b/l. No pain, crepitus or joint limitation noted with ROM b/l LE.  Patient ambulates independently without assistive aids.  Assessment/Plan: 1. Pain due to onychomycosis of toenails of both feet   2. Porokeratosis   3. Coagulation defect [facility])   -Patient was evaluated and treated. All patient's and/or POA's questions/concerns answered on today's visit. -Continue supportive shoe gear daily. -Mycotic toenails 1-5 bilaterally were debrided in length and girth with sterile nail nippers and dremel without incident. -Porokeratotic lesion(s) L hallux, R hallux, submet head 2 left foot, and submet head 2 right foot pared and enucleated with sterile currette. Pinpoint bleeding left submet head 2 poro addressed with Lumicain Hemostatic solution. TAO and band-aid applied. No further treatment required. Total number of lesions debrided=2. -Continue ammonium lactate lotion for xerosis. -Patient/POA to call should there be question/concern in the interim.   Return in about 3 months (around 03/05/2023).  Freddie Breech, DPM

## 2022-12-05 ENCOUNTER — Other Ambulatory Visit: Payer: Self-pay

## 2022-12-16 ENCOUNTER — Encounter: Payer: Self-pay | Admitting: Hematology

## 2022-12-29 ENCOUNTER — Other Ambulatory Visit: Payer: Self-pay | Admitting: Internal Medicine

## 2022-12-29 DIAGNOSIS — I10 Essential (primary) hypertension: Secondary | ICD-10-CM

## 2023-02-07 ENCOUNTER — Encounter: Payer: Self-pay | Admitting: Hematology

## 2023-03-09 ENCOUNTER — Ambulatory Visit (INDEPENDENT_AMBULATORY_CARE_PROVIDER_SITE_OTHER): Payer: Medicare HMO | Admitting: Podiatry

## 2023-03-09 ENCOUNTER — Encounter: Payer: Self-pay | Admitting: Hematology

## 2023-03-09 DIAGNOSIS — L84 Corns and callosities: Secondary | ICD-10-CM

## 2023-03-09 DIAGNOSIS — B351 Tinea unguium: Secondary | ICD-10-CM | POA: Diagnosis not present

## 2023-03-09 DIAGNOSIS — I739 Peripheral vascular disease, unspecified: Secondary | ICD-10-CM | POA: Diagnosis not present

## 2023-03-09 NOTE — Progress Notes (Signed)
       Subjective:  Patient ID: Tina Waller, female    DOB: 11/01/1940,  MRN: [id number]  Patient presents today with concern of thick, elongated nails that cause discomfort in shoe gear.  She also has painful corns on the plantar aspect of both feet near the ball of the foot.  She is currently under hospice care for rectal cancer.  States that she is not receiving any treatment.  Hospice providers will come to her home couple days per week and to provide care.  She uses a cane to assist with ambulation.  She no longer sees her PCP and only sees a hospice physician.  She had her last televisit with Dr. Jonah Blue on 11/21/2022  Past Medical History:  Diagnosis Date   Anemia    Anxiety    Cancer (HCC)    rectal   Cataract    Depression    Diabetes mellitus without complication (HCC)    pt denies   GERD (gastroesophageal reflux disease)    Gout    Headache    Hyperlipidemia    Hypertension    Oxygen deficiency    wears oxygen at noc   Stroke [facility]) 2008   09/14/2018   Tonsillar mass    Type 2 diabetes mellitus (HCC) 09/14/2018   Vertigo    Wears dentures    upper   Wears glasses     Allergies  Allergen Reactions   Lisinopril Cough   Carvedilol Diarrhea   Hydralazine Diarrhea and Nausea And Vomiting   Lipitor [Atorvastatin] Diarrhea    Review of Systems: Negative except as noted in the HPI.  Objective:  There were no vitals filed for this visit.  SWANNIE RASE is a pleasant 82 y.o. female in NAD. AAO x 3.  Vascular Examination: Patient has palpable DP pulse, absent PT pulse bilateral.  Delayed capillary refill bilateral toes.  Sparse digital hair bilateral.  Proximal to distal cooling WNL bilateral.    Dermatological Examination: Interspaces are clear with no open lesions noted bilateral.  Skin is shiny and atrophic bilateral.  Nails are 3-71mm thick, with yellowish/brown discoloration, subungual debris and distal onycholysis x10.  There is pain with  compression of nails x10.  There are hyperkeratotic lesions noted submet 2 and submet 5 bilateral.  Patient qualifies for at-risk foot care because of PVD.  Assessment/Plan: 1. Dermatophytosis of nail   2. Corns   3. PVD (peripheral vascular disease) (HCC)     Mycotic nails x10 were sharply debrided with sterile nail nippers and power debriding burr to decrease bulk and length.  Hyperkeratotic lesions x 4 were shaved with #312 blade.   Return in about 3 months (around 06/09/2023) for RFC.   Clerance Lav, DPM, FACFAS [facility]     [address] [place], [place] [address]                Office [phone]  Fax [phone]

## 2023-03-10 ENCOUNTER — Other Ambulatory Visit: Payer: Self-pay

## 2023-03-12 ENCOUNTER — Other Ambulatory Visit: Payer: Self-pay | Admitting: Internal Medicine

## 2023-05-25 ENCOUNTER — Other Ambulatory Visit: Payer: Self-pay | Admitting: Internal Medicine

## 2023-05-25 DIAGNOSIS — I10 Essential (primary) hypertension: Secondary | ICD-10-CM

## 2023-05-26 NOTE — Telephone Encounter (Signed)
Requested medications are due for refill today.  yes  Requested medications are on the active medications list.  yes  Last refill. 12/29/2022 #90 1 rf  Future visit scheduled.   no  Notes to clinic.  No PCP listed.     Requested Prescriptions  Pending Prescriptions Disp Refills   losartan (COZAAR) 100 MG tablet [Pharmacy Med Name: Losartan Potassium Oral Tablet 100 MG] 90 tablet 3    Sig: TAKE 1 TABLET EVERY DAY     Cardiovascular:  Angiotensin Receptor Blockers Failed - 05/25/2023  2:37 AM      Failed - Cr in normal range and within 180 days    Creatinine  Date Value Ref Range Status  10/17/2022 1.47 (H) 0.44 - 1.00 mg/dL Final   Creat  Date Value Ref Range Status  04/21/2016 1.35 (H) 0.60 - 0.93 mg/dL Final    Comment:      For patients > or = 80 years of age: The upper reference limit for Creatinine is approximately 13% higher for people identified as African-American.            Failed - K in normal range and within 180 days    Potassium  Date Value Ref Range Status  10/17/2022 4.8 3.5 - 5.1 mmol/L Final         Failed - Valid encounter within last 6 months    Recent Outpatient Visits           8 months ago Patient left before evaluation by physician   [facility] [place]. [facility] Marcine Matar, MD   10 months ago Pulmonary emphysema, unspecified emphysema type [facility])   [facility] [place]. [facility] Marcine Matar, MD   11 months ago Essential hypertension   [facility] [place]. [facility] Drucilla Chalet, RPH-CPP   1 year ago Essential hypertension   [facility] Of [place]. [facility] Marcine Matar, MD   1 year ago Essential hypertension   [facility] [place]. [facility] [place] L, RPH-CPP               Passed - Patient is not pregnant      Passed - Last BP in normal range    BP Readings from Last 1 Encounters:  10/17/22 137/65

## 2023-05-27 ENCOUNTER — Telehealth: Payer: Self-pay | Admitting: Internal Medicine

## 2023-05-27 NOTE — Telephone Encounter (Signed)
I received a refill request on Cozaar. Called and spoke with the RN with [facility].  She reports that patient is still on hospice and receiving medications.  She allowed me to speak with the pt's case manager Renee.  I informed her about the request that I received a refill on Cozaar.  She told me that they are hospice physician has been handling refills on all of her medications and will take care of the refill on Cozaar.

## 2023-06-03 ENCOUNTER — Encounter: Payer: Self-pay | Admitting: Hematology

## 2023-06-10 ENCOUNTER — Ambulatory Visit: Payer: Medicare HMO | Admitting: Podiatry

## 2023-06-15 ENCOUNTER — Ambulatory Visit: Payer: Medicare HMO | Admitting: Podiatry

## 2023-06-17 ENCOUNTER — Other Ambulatory Visit: Payer: Self-pay | Admitting: Internal Medicine

## 2023-06-17 DIAGNOSIS — I10 Essential (primary) hypertension: Secondary | ICD-10-CM

## 2023-07-28 ENCOUNTER — Other Ambulatory Visit: Payer: Self-pay

## 2023-08-24 ENCOUNTER — Ambulatory Visit (INDEPENDENT_AMBULATORY_CARE_PROVIDER_SITE_OTHER): Payer: Medicare HMO | Admitting: Podiatry

## 2023-08-24 ENCOUNTER — Encounter: Payer: Self-pay | Admitting: Podiatry

## 2023-08-24 VITALS — Ht 69.0 in

## 2023-08-24 DIAGNOSIS — M79675 Pain in left toe(s): Secondary | ICD-10-CM | POA: Diagnosis not present

## 2023-08-24 DIAGNOSIS — M79674 Pain in right toe(s): Secondary | ICD-10-CM

## 2023-08-24 DIAGNOSIS — L84 Corns and callosities: Secondary | ICD-10-CM

## 2023-08-24 DIAGNOSIS — I739 Peripheral vascular disease, unspecified: Secondary | ICD-10-CM

## 2023-08-24 DIAGNOSIS — B351 Tinea unguium: Secondary | ICD-10-CM | POA: Diagnosis not present

## 2023-08-24 NOTE — Progress Notes (Signed)
       Subjective:  Patient ID: Tina Waller, female    DOB: 12-31-1940,  MRN: [id number]  Patient presents today with concern of thick, elongated nails that cause discomfort in shoe gear.  She also has painful corns on the plantar aspect of both feet near the ball of the foot.  She is still under hospice care for rectal cancer.  States that she is not receiving any treatment.  Hospice providers will come to her home couple days per week and to provide care.  She uses a cane to assist with ambulation.  She no longer sees her PCP and only sees a hospice physician.  She had her last televisit with Dr. Concetta Dee around 06/23/2023  Past Medical History:  Diagnosis Date   Anemia    Anxiety    Cancer (HCC)    rectal   Cataract    Depression    Diabetes mellitus without complication (HCC)    pt denies   GERD (gastroesophageal reflux disease)    Gout    Headache    Hyperlipidemia    Hypertension    Oxygen  deficiency    wears oxygen  at noc   Stroke [facility]) 2008   09/14/2018   Tonsillar mass    Type 2 diabetes mellitus (HCC) 09/14/2018   Vertigo    Wears dentures    upper   Wears glasses     Allergies  Allergen Reactions   Lisinopril  Cough   Carvedilol  Diarrhea   Hydralazine  Diarrhea and Nausea And Vomiting   Lipitor [Atorvastatin ] Diarrhea    Review of Systems: Negative except as noted in the HPI.  Objective:  Tina Waller is a pleasant 83 y.o. female in NAD. AAO x 3.  Vascular Examination: Patient has palpable DP pulse, absent PT pulse bilateral.  Delayed capillary refill bilateral toes.  Sparse digital hair bilateral.  Proximal to distal cooling WNL bilateral.    Dermatological Examination: Interspaces are clear with no open lesions noted bilateral.  Skin is shiny and atrophic bilateral.  Nails are 3-65mm thick, with yellowish/brown discoloration, subungual debris and distal onycholysis x10.  There is pain with compression of nails x10.  There are  hyperkeratotic lesions noted submet 2 and submet 5 bilateral.  Patient qualifies for at-risk foot care because of PVD.  Assessment/Plan: 1. Pain due to onychomycosis of toenails of both feet   2. Corns   3. PVD (peripheral vascular disease) (HCC)     Mycotic nails x10 were sharply debrided with sterile nail nippers and power debriding burr to decrease bulk and length.  Hyperkeratotic lesions x 4 (all on ball of feet) were shaved with #312 blade.  Return if symptoms worsen or fail to improve.   Joe Murders, DPM, FACFAS [facility]     [address] [place], [place] [address]                Office [phone]  Fax [phone]

## 2023-09-06 ENCOUNTER — Other Ambulatory Visit: Payer: Self-pay

## 2023-11-30 ENCOUNTER — Encounter: Payer: Self-pay | Admitting: Hematology

## 2023-12-01 ENCOUNTER — Other Ambulatory Visit: Payer: Self-pay

## 2023-12-28 ENCOUNTER — Ambulatory Visit: Admitting: Podiatry

## 2023-12-28 DIAGNOSIS — I739 Peripheral vascular disease, unspecified: Secondary | ICD-10-CM

## 2023-12-28 DIAGNOSIS — M79674 Pain in right toe(s): Secondary | ICD-10-CM

## 2023-12-28 DIAGNOSIS — L84 Corns and callosities: Secondary | ICD-10-CM

## 2023-12-28 DIAGNOSIS — M79675 Pain in left toe(s): Secondary | ICD-10-CM | POA: Diagnosis not present

## 2023-12-28 DIAGNOSIS — B351 Tinea unguium: Secondary | ICD-10-CM

## 2023-12-28 NOTE — Progress Notes (Signed)
       Subjective:  Patient ID: Tina Waller, female    DOB: 04-19-1941,  MRN: [id number]  Patient presents today with concern of thick, elongated nails that cause discomfort in shoe gear.  she has painful corn near the right first MPJ she is still under hospice care for rectal cancer.  States that she is not receiving any treatment.  Hospice providers will come to her home couple days per week and to provide care.  She uses a cane to assist with ambulation.  She took public transportation to her appointment today.  She no longer sees her PCP and only sees a hospice physician.  She had her last televisit with Dr. Concetta Dee around 12/16/2023  Past Medical History:  Diagnosis Date   Anemia    Anxiety    Cancer (HCC)    rectal   Cataract    Depression    Diabetes mellitus without complication (HCC)    pt denies   GERD (gastroesophageal reflux disease)    Gout    Headache    Hyperlipidemia    Hypertension    Oxygen  deficiency    wears oxygen  at noc   Stroke [facility]) 2008   09/14/2018   Tonsillar mass    Type 2 diabetes mellitus (HCC) 09/14/2018   Vertigo    Wears dentures    upper   Wears glasses     Allergies  Allergen Reactions   Lisinopril  Cough   Carvedilol  Diarrhea   Hydralazine  Diarrhea and Nausea And Vomiting   Lipitor [Atorvastatin ] Diarrhea    Review of Systems: Negative except as noted in the HPI.  Objective:  Tina Waller is a pleasant 83 y.o. female in NAD. AAO x 3.  Vascular Examination: Patient has palpable DP pulse, absent PT pulse bilateral.  Delayed capillary refill bilateral toes.  Sparse digital hair bilateral.  Proximal to distal cooling WNL bilateral.    Dermatological Examination: Interspaces are clear with no open lesions noted bilateral.  Skin is shiny and atrophic bilateral.  Nails are 3-5mm thick, with yellowish/brown discoloration, subungual debris and distal onycholysis x10.  There is pain with compression of nails x10.  There are  hyperkeratotic lesion noted on plantar medial aspect first MPJ right foot.  Patient qualifies for at-risk foot care because of PVD.  Assessment/Plan: 1. Pain due to onychomycosis of toenails of both feet   2. Corns   3. PVD (peripheral vascular disease) (HCC)     Mycotic nails x10 were sharply debrided with sterile nail nippers and power debriding burr to decrease bulk and length.  Hyperkeratotic lesions x 1 was shaved with #312 blade.  Return if symptoms worsen or fail to improve.  She prefers to call as needed for services since she is hospice status   Kortlyn Koltz DEstle Hemp, DPM, FACFAS [facility]     [address] [place], [place] [address]                Office [phone]  Fax [phone]

## 2024-01-03 IMAGING — DX DG FOOT COMPLETE 3+V*R*
3 series · 3 of 3 positions shown · non-contrast
Comparison: None Available.

CLINICAL DATA: Pain and injury to the dorsal right foot.

EXAM:
RIGHT FOOT COMPLETE - 3+ VIEW

[foot supine dp]
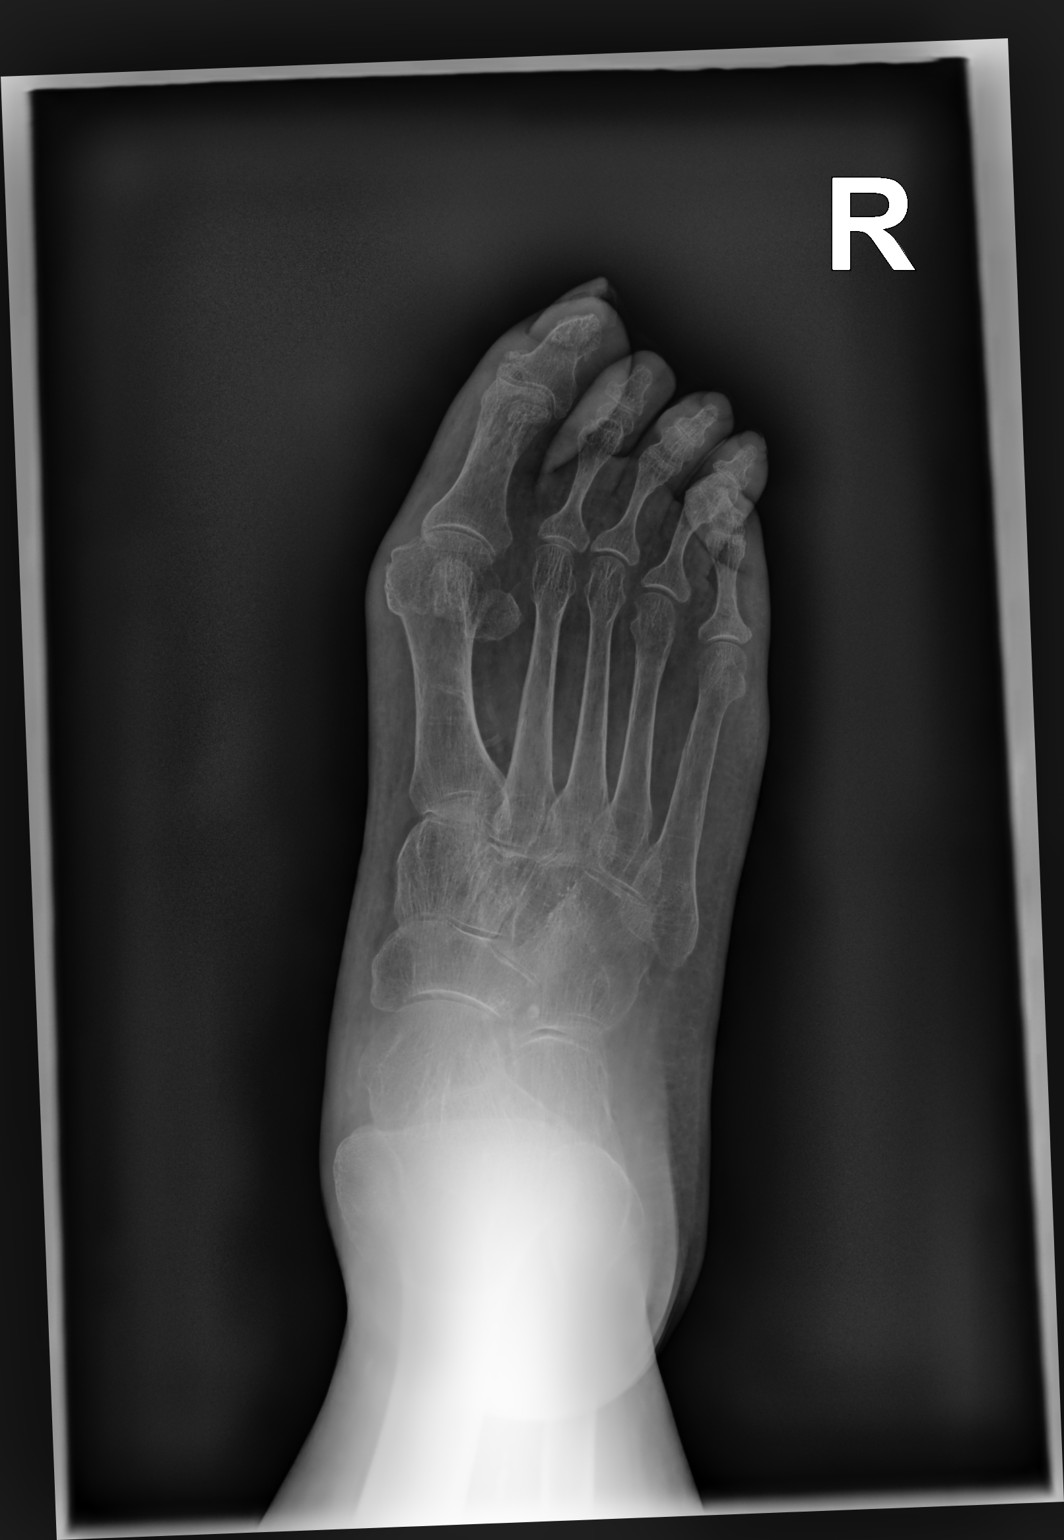

[foot medial oblique]
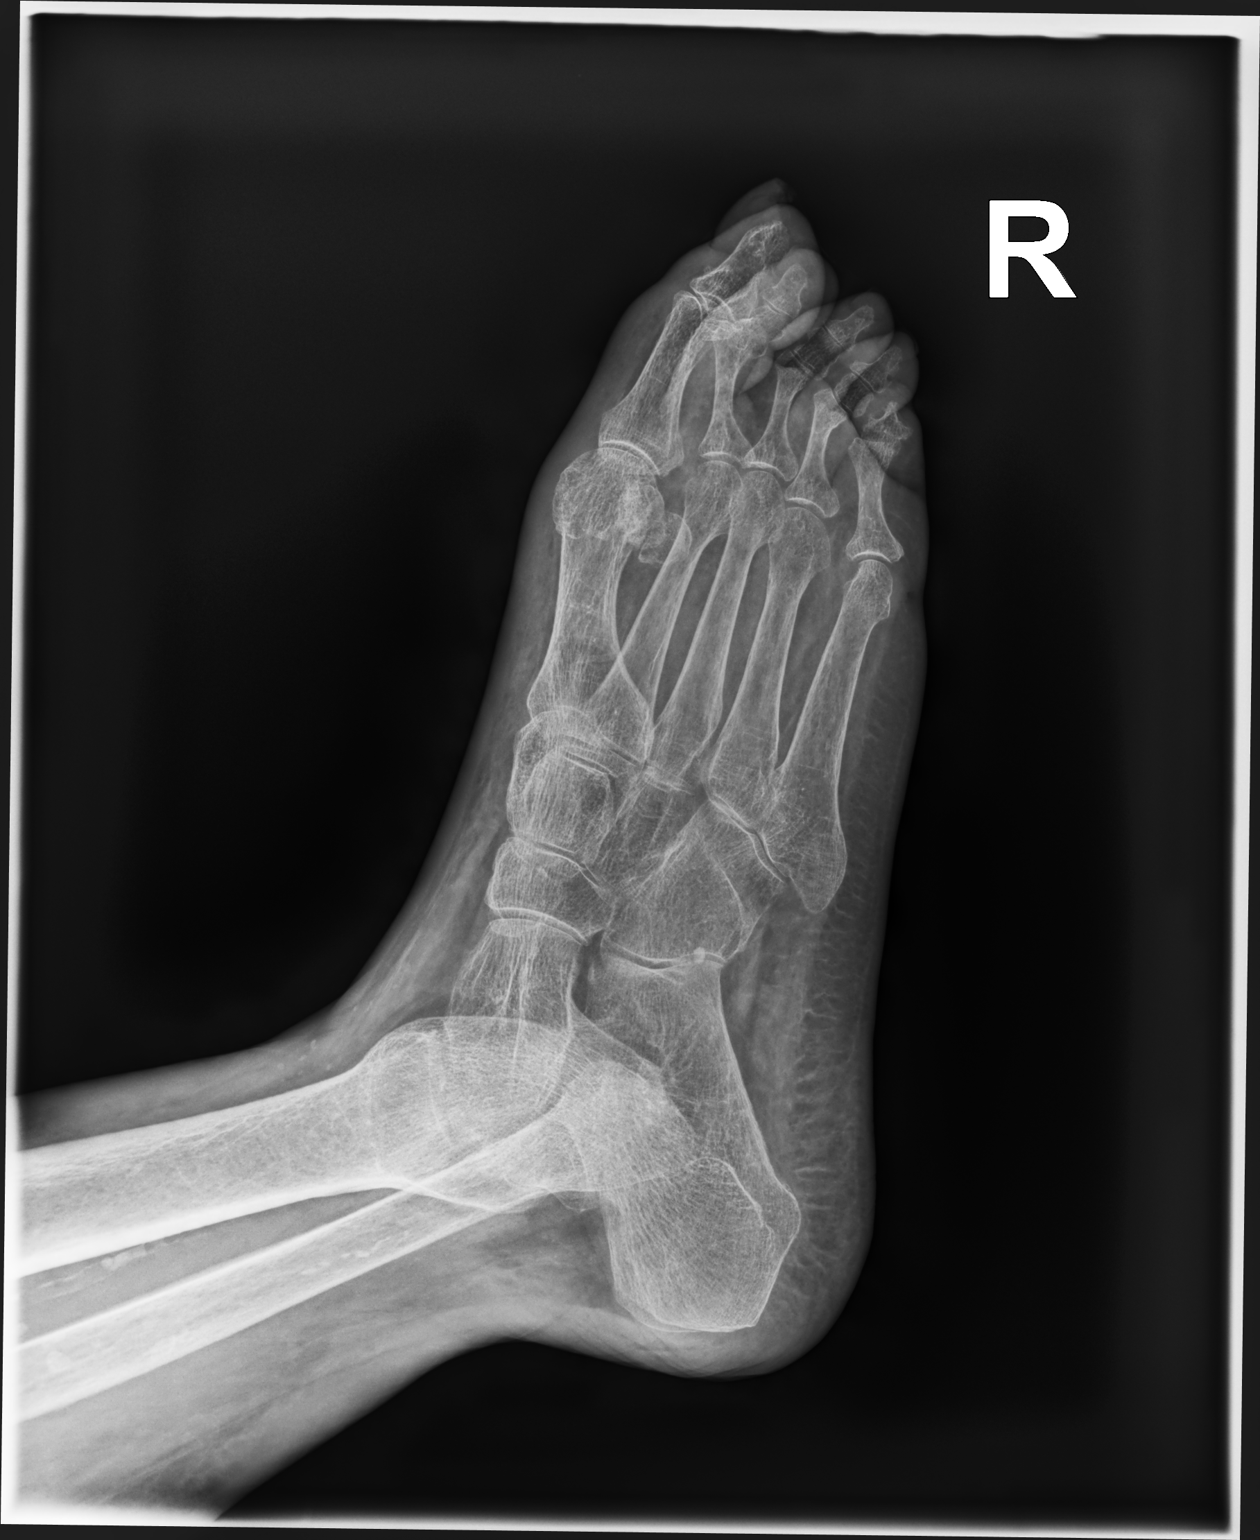

[foot supine lat]
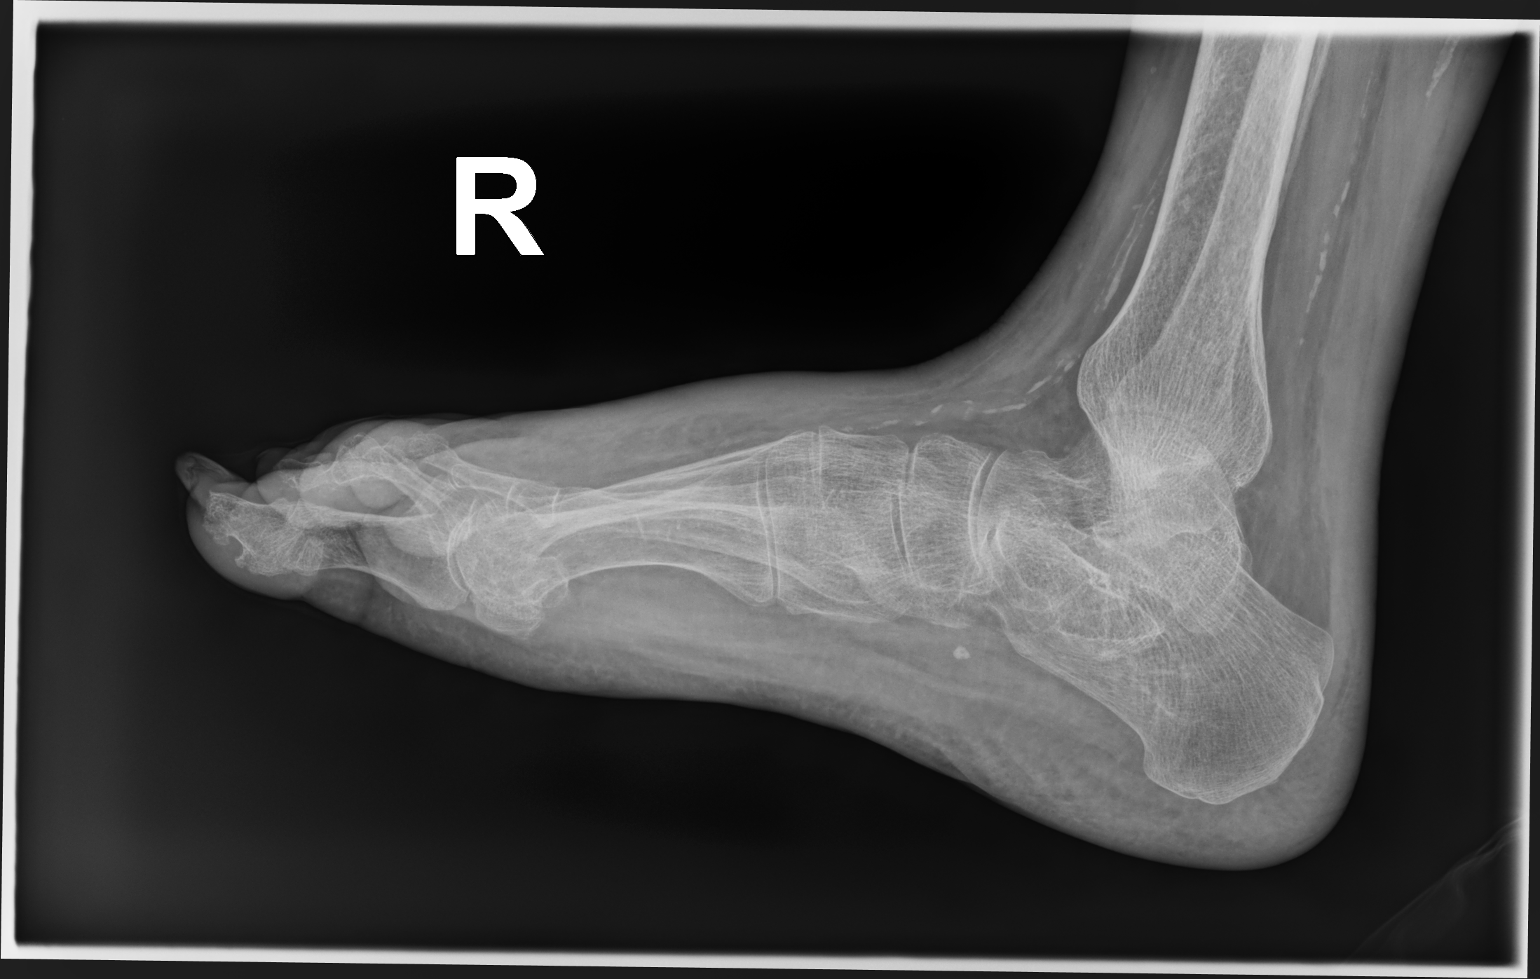

[3 of 3 positions shown; findings below may reference images not displayed]

FINDINGS: Mild cortical irregularity or the medial aspect of the base of the
proximal phalanx of the first digit concerning for a mildly
displaced fracture. Osteopenia. There is no evidence of arthropathy
or other focal bone abnormality. Soft tissue swelling about the
dorsum of the foot. Prominent vascular calcifications.
IMPRESSION: 1. Mild cortical irregularity of the medial aspect of the base of
the proximal phalanx of the first digit, concerning for a mildly
displaced fracture, correlate with localized pain.

2.  Soft tissue swelling about the dorsum of the foot.

3.  Osteopenia.

## 2024-01-19 ENCOUNTER — Other Ambulatory Visit: Payer: Self-pay

## 2024-02-18 DIAGNOSIS — I10 Essential (primary) hypertension: Secondary | ICD-10-CM | POA: Diagnosis not present

## 2024-02-18 DIAGNOSIS — H5200 Hypermetropia, unspecified eye: Secondary | ICD-10-CM | POA: Diagnosis not present

## 2024-02-24 DIAGNOSIS — H26491 Other secondary cataract, right eye: Secondary | ICD-10-CM | POA: Diagnosis not present

## 2024-02-24 DIAGNOSIS — Z01818 Encounter for other preprocedural examination: Secondary | ICD-10-CM | POA: Diagnosis not present

## 2024-02-24 DIAGNOSIS — H25812 Combined forms of age-related cataract, left eye: Secondary | ICD-10-CM | POA: Diagnosis not present

## 2024-03-18 DIAGNOSIS — H25812 Combined forms of age-related cataract, left eye: Secondary | ICD-10-CM | POA: Diagnosis not present

## 2024-03-18 DIAGNOSIS — N189 Chronic kidney disease, unspecified: Secondary | ICD-10-CM | POA: Diagnosis not present

## 2024-03-18 DIAGNOSIS — I129 Hypertensive chronic kidney disease with stage 1 through stage 4 chronic kidney disease, or unspecified chronic kidney disease: Secondary | ICD-10-CM | POA: Diagnosis not present

## 2024-04-06 ENCOUNTER — Telehealth: Payer: Self-pay

## 2024-04-06 NOTE — Telephone Encounter (Signed)
 Patient was identified as falling into the [place] Measure - Diabetes.   Patient was: Left voicemail to schedule with primary care provider.

## 2024-04-13 NOTE — Telephone Encounter (Signed)
 error
